# Patient Record
Sex: Female | Born: 1940 | Race: White | Hispanic: No | Smoking: Former smoker
Health system: Southern US, Community
[De-identification: ages and names within clinical notes are randomized; demographics above are authoritative.]

## PROBLEM LIST (undated history)

## (undated) DIAGNOSIS — Z720 Tobacco use: Secondary | ICD-10-CM

## (undated) DIAGNOSIS — N281 Cyst of kidney, acquired: Secondary | ICD-10-CM

## (undated) DIAGNOSIS — Z8673 Personal history of transient ischemic attack (TIA), and cerebral infarction without residual deficits: Secondary | ICD-10-CM

## (undated) DIAGNOSIS — Z8679 Personal history of other diseases of the circulatory system: Secondary | ICD-10-CM

## (undated) DIAGNOSIS — Z950 Presence of cardiac pacemaker: Secondary | ICD-10-CM

## (undated) DIAGNOSIS — Z872 Personal history of diseases of the skin and subcutaneous tissue: Secondary | ICD-10-CM

## (undated) DIAGNOSIS — I4510 Unspecified right bundle-branch block: Secondary | ICD-10-CM

## (undated) DIAGNOSIS — I5032 Chronic diastolic (congestive) heart failure: Secondary | ICD-10-CM

## (undated) DIAGNOSIS — I10 Essential (primary) hypertension: Secondary | ICD-10-CM

## (undated) DIAGNOSIS — Z952 Presence of prosthetic heart valve: Secondary | ICD-10-CM

## (undated) DIAGNOSIS — F329 Major depressive disorder, single episode, unspecified: Secondary | ICD-10-CM

## (undated) DIAGNOSIS — R5381 Other malaise: Secondary | ICD-10-CM

## (undated) DIAGNOSIS — E785 Hyperlipidemia, unspecified: Secondary | ICD-10-CM

## (undated) DIAGNOSIS — G629 Polyneuropathy, unspecified: Secondary | ICD-10-CM

## (undated) DIAGNOSIS — G4730 Sleep apnea, unspecified: Secondary | ICD-10-CM

## (undated) DIAGNOSIS — F419 Anxiety disorder, unspecified: Secondary | ICD-10-CM

## (undated) DIAGNOSIS — N289 Disorder of kidney and ureter, unspecified: Secondary | ICD-10-CM

## (undated) DIAGNOSIS — D649 Anemia, unspecified: Secondary | ICD-10-CM

## (undated) DIAGNOSIS — F32A Depression, unspecified: Secondary | ICD-10-CM

## (undated) DIAGNOSIS — I350 Nonrheumatic aortic (valve) stenosis: Secondary | ICD-10-CM

## (undated) DIAGNOSIS — M1990 Unspecified osteoarthritis, unspecified site: Secondary | ICD-10-CM

## (undated) DIAGNOSIS — K819 Cholecystitis, unspecified: Secondary | ICD-10-CM

## (undated) DIAGNOSIS — T4145XA Adverse effect of unspecified anesthetic, initial encounter: Secondary | ICD-10-CM

## (undated) HISTORY — PX: ADENOIDECTOMY: SUR15

## (undated) HISTORY — PX: COLONOSCOPY: SHX174

## (undated) HISTORY — DX: Unspecified right bundle-branch block: I45.10

## (undated) HISTORY — DX: Morbid (severe) obesity due to excess calories: E66.01

## (undated) HISTORY — PX: OTHER SURGICAL HISTORY: SHX169

## (undated) HISTORY — DX: Personal history of diseases of the skin and subcutaneous tissue: Z87.2

## (undated) HISTORY — DX: Tobacco use: Z72.0

## (undated) HISTORY — DX: Chronic diastolic (congestive) heart failure: I50.32

## (undated) HISTORY — DX: Personal history of other diseases of the circulatory system: Z86.79

## (undated) HISTORY — DX: Depression, unspecified: F32.A

## (undated) HISTORY — DX: Major depressive disorder, single episode, unspecified: F32.9

## (undated) HISTORY — DX: Hyperlipidemia, unspecified: E78.5

## (undated) HISTORY — DX: Cholecystitis, unspecified: K81.9

## (undated) HISTORY — PX: EYE SURGERY: SHX253

## (undated) HISTORY — DX: Polyneuropathy, unspecified: G62.9

## (undated) HISTORY — PX: TONSILLECTOMY: SUR1361

---

## 2009-10-15 DIAGNOSIS — E782 Mixed hyperlipidemia: Secondary | ICD-10-CM | POA: Diagnosis present

## 2011-08-17 DIAGNOSIS — I83009 Varicose veins of unspecified lower extremity with ulcer of unspecified site: Secondary | ICD-10-CM | POA: Diagnosis present

## 2011-08-17 DIAGNOSIS — L989 Disorder of the skin and subcutaneous tissue, unspecified: Secondary | ICD-10-CM | POA: Diagnosis present

## 2012-01-22 DIAGNOSIS — I358 Other nonrheumatic aortic valve disorders: Secondary | ICD-10-CM | POA: Insufficient documentation

## 2016-02-21 DIAGNOSIS — L89314 Pressure ulcer of right buttock, stage 4: Secondary | ICD-10-CM | POA: Insufficient documentation

## 2016-02-21 DIAGNOSIS — L89312 Pressure ulcer of right buttock, stage 2: Secondary | ICD-10-CM | POA: Insufficient documentation

## 2016-02-29 DIAGNOSIS — I350 Nonrheumatic aortic (valve) stenosis: Secondary | ICD-10-CM

## 2016-02-29 DIAGNOSIS — D649 Anemia, unspecified: Secondary | ICD-10-CM | POA: Insufficient documentation

## 2016-05-10 DIAGNOSIS — I878 Other specified disorders of veins: Secondary | ICD-10-CM | POA: Diagnosis present

## 2016-05-10 DIAGNOSIS — I872 Venous insufficiency (chronic) (peripheral): Secondary | ICD-10-CM | POA: Diagnosis present

## 2016-05-12 DIAGNOSIS — I272 Pulmonary hypertension, unspecified: Secondary | ICD-10-CM | POA: Insufficient documentation

## 2016-05-15 DIAGNOSIS — J9612 Chronic respiratory failure with hypercapnia: Secondary | ICD-10-CM | POA: Diagnosis present

## 2016-05-15 DIAGNOSIS — J9611 Chronic respiratory failure with hypoxia: Secondary | ICD-10-CM | POA: Diagnosis present

## 2016-05-23 DIAGNOSIS — I059 Rheumatic mitral valve disease, unspecified: Secondary | ICD-10-CM | POA: Insufficient documentation

## 2016-06-20 DIAGNOSIS — K921 Melena: Secondary | ICD-10-CM | POA: Insufficient documentation

## 2016-06-20 DIAGNOSIS — I110 Hypertensive heart disease with heart failure: Secondary | ICD-10-CM | POA: Diagnosis present

## 2016-06-27 DIAGNOSIS — I82409 Acute embolism and thrombosis of unspecified deep veins of unspecified lower extremity: Secondary | ICD-10-CM | POA: Insufficient documentation

## 2016-06-27 DIAGNOSIS — D62 Acute posthemorrhagic anemia: Secondary | ICD-10-CM | POA: Insufficient documentation

## 2016-06-28 DIAGNOSIS — I5043 Acute on chronic combined systolic (congestive) and diastolic (congestive) heart failure: Secondary | ICD-10-CM | POA: Insufficient documentation

## 2016-07-01 DIAGNOSIS — K209 Esophagitis, unspecified without bleeding: Secondary | ICD-10-CM | POA: Insufficient documentation

## 2016-09-01 DIAGNOSIS — R5383 Other fatigue: Secondary | ICD-10-CM

## 2017-04-11 DIAGNOSIS — K810 Acute cholecystitis: Secondary | ICD-10-CM | POA: Diagnosis present

## 2017-05-22 DIAGNOSIS — K8020 Calculus of gallbladder without cholecystitis without obstruction: Secondary | ICD-10-CM

## 2017-05-22 DIAGNOSIS — R109 Unspecified abdominal pain: Secondary | ICD-10-CM | POA: Insufficient documentation

## 2018-01-21 ENCOUNTER — Ambulatory Visit (INDEPENDENT_AMBULATORY_CARE_PROVIDER_SITE_OTHER): Payer: Medicare PPO | Admitting: Cardiology

## 2018-01-21 ENCOUNTER — Encounter: Payer: Self-pay | Admitting: Cardiology

## 2018-01-21 VITALS — BP 156/88 | HR 86 | Ht 66.0 in | Wt 274.0 lb

## 2018-01-21 DIAGNOSIS — Z8679 Personal history of other diseases of the circulatory system: Secondary | ICD-10-CM

## 2018-01-21 DIAGNOSIS — E782 Mixed hyperlipidemia: Secondary | ICD-10-CM

## 2018-01-21 DIAGNOSIS — R011 Cardiac murmur, unspecified: Secondary | ICD-10-CM

## 2018-01-21 DIAGNOSIS — F17201 Nicotine dependence, unspecified, in remission: Secondary | ICD-10-CM | POA: Diagnosis not present

## 2018-01-21 NOTE — Progress Notes (Signed)
Cardiology Office Note  Date: 01/21/2018   ID: Dawn Gonzales, DOB 06-14-1940, MRN [id number]  PCP: Benita Stabile, MD  Consulting Cardiologist: Nona Dell, MD   Chief Complaint  Patient presents with  . Heart Murmur    History of Present Illness: Dawn Gonzales is a 77 y.o. female referred for cardiology consultation by Dr. Margo Aye.  She is here today with her daughter.  At the present time I do not have any records regarding her previous medical history while living in [place].  Reportedly, back in 2017 she was diagnosed with leg cellulitis, ultimately endocarditis, had a prolonged rehabilitation stay of almost a year, and was discharged to home back in March of this year.  She has moved to [place] to be closer to family.  She also tells me that she has a long-standing history of heart murmur dating back many years.  She was seen by a cardiologist during her above described evaluation for what sounds like aortic stenosis.  Based on her description to me today, it sounds like TAVR was discussed as an option.  Current medications are outlined below.  She is on aspirin and Pravachol, states that she has been on these for at least a year.  She does not report any exertional chest pain.  Functionally limited at baseline, in a wheelchair today.  Personally reviewed her ECG today which shows a sinus rhythm with right bundle branch block and lead loss in V6.  Past Medical History:  Diagnosis Date  . Depression   . Heart murmur   . History of cellulitis   . History of endocarditis   . Hyperlipemia   . Neuropathy     Past Surgical History:  Procedure Laterality Date  . Abdominal gangrene    . ADENOIDECTOMY    . Cataract surgery    . COLONOSCOPY    . TONSILLECTOMY    . Tummy tuck      Current Outpatient Medications  Medication Sig Dispense Refill  . aspirin EC 81 MG tablet Take 81 mg by mouth daily.    . Calcium Carbonate (CALCIUM-CARB 600 PO) Take 600 mg  by mouth daily.    . ferrous sulfate 325 (65 FE) MG tablet Take 325 mg by mouth daily with breakfast.    . gabapentin (NEURONTIN) 300 MG capsule Take 300 mg by mouth 3 (three) times daily.    . Multiple Vitamin (MULTIVITAMIN WITH MINERALS) TABS tablet Take 1 tablet by mouth daily.    . pravastatin (PRAVACHOL) 20 MG tablet Take 20 mg by mouth daily.    Marland Kitchen venlafaxine (EFFEXOR) 37.5 MG tablet Take 37.5 mg by mouth daily.     No current facility-administered medications for this visit.    Allergies:  Patient has no known allergies.   Social History: The patient  reports that she quit smoking about 39 years ago. She has never used smokeless tobacco. She reports that she drinks alcohol. She reports that she does not use drugs.   Family History: The patient's family history includes Arthritis in her father; Cancer in her mother; Hypertension in her father.   ROS:  Please see the history of present illness. Otherwise, complete review of systems is positive for some trouble with memory, chronic leg swelling and venous stasis.  All other systems are reviewed and negative.   Physical Exam: VS:  BP (!) 156/88 (BP Location: Left Wrist)   Pulse 86   Ht 5\' 6"  (1.676 m)   Wt 274 lb (124.3  kg)   SpO2 93%   BMI 44.22 kg/m , BMI Body mass index is 44.22 kg/m.  Wt Readings from Last 3 Encounters:  01/21/18 274 lb (124.3 kg)    General: Morbidly obese woman seated in wheelchair. HEENT: Conjunctiva and lids normal, oropharynx clear. Neck: Supple, no elevated JVP, systolic heart murmur radiates to the carotids, no thyromegaly. Lungs: Clear to auscultation, nonlabored breathing at rest. Cardiac: Regular rate and rhythm, no S3, 3-4/6 basal systolic murmur, no pericardial rub. Abdomen: Morbidly obese with pannus, nontender, bowel sounds present. Extremities: Chronic appearing bilateral leg edema with distal venous stasis, distal pulses 1+. Skin: Warm and dry. Musculoskeletal: No  kyphosis. Neuropsychiatric: Alert and oriented x3, affect grossly appropriate.  ECG: No old tracing available for comparison today.  Recent Labwork:  No recent lab work available for review.  Other Studies Reviewed Today:  No prior echocardiogram available for review.  Assessment and Plan:  1.  Patient presents for cardiology evaluation with incomplete information regarding previous work-up.  She has a long-standing heart murmur and most likely aortic stenosis.  Complicating this picture is described endocarditis that occurred during treatment for cellulitis within the last 1-1/2 years while living in [place].  She states that she had "heart failure" at that time, although uncertain whether this was valvular related to left ventricular dysfunction.  Plan is to obtain an echocardiogram for baseline and also request records.  We will bring her back to the office for further discussion.   2.  Mixed hyperlipidemia, on Pravachol.  Now following with Dr. Margo AyeHall.  3.  Tobacco abuse in remission, quit many years ago.  Current medicines were reviewed with the patient today.   Orders Placed This Encounter  Procedures  . EKG 12-Lead  . ECHOCARDIOGRAM COMPLETE    Disposition: Follow-up in 6 weeks.  Signed, Jonelle SidleSamuel G. Dashauna Heymann, MD, [facility] 01/21/2018 1:57 PM    [facility] [address], [place], [place] [address] Phone: [phone]; Fax: [phone]

## 2018-01-21 NOTE — Patient Instructions (Signed)
Your physician recommends that you schedule a follow-up appointment in: 6 weeks with Dr. Diona BrownerMcDowell  Your physician has requested that you have an echocardiogram. Echocardiography is a painless test that uses sound waves to create images of your heart. It provides your doctor with information about the size and shape of your heart and how well your heart's chambers and valves are working. This procedure takes approximately one hour. There are no restrictions for this procedure.    Your physician recommends that you continue on your current medications as directed. Please refer to the Current Medication list given to you today.     If you need a refill on your cardiac medications before your next appointment, please call your pharmacy.     No lab work ordered today      Thank you for choosing [facility] !

## 2018-02-01 ENCOUNTER — Ambulatory Visit (HOSPITAL_COMMUNITY)
Admission: RE | Admit: 2018-02-01 | Discharge: 2018-02-01 | Disposition: A | Discharge status: Home / Self Care | Payer: Medicare PPO | Source: Ambulatory Visit | Attending: Internal Medicine | Admitting: Internal Medicine

## 2018-02-01 DIAGNOSIS — R7881 Bacteremia: Secondary | ICD-10-CM | POA: Insufficient documentation

## 2018-02-01 DIAGNOSIS — R011 Cardiac murmur, unspecified: Secondary | ICD-10-CM | POA: Diagnosis not present

## 2018-02-01 DIAGNOSIS — I080 Rheumatic disorders of both mitral and aortic valves: Secondary | ICD-10-CM | POA: Insufficient documentation

## 2018-02-01 DIAGNOSIS — E785 Hyperlipidemia, unspecified: Secondary | ICD-10-CM | POA: Diagnosis not present

## 2018-02-06 ENCOUNTER — Observation Stay (HOSPITAL_COMMUNITY): Payer: Medicare PPO

## 2018-02-06 ENCOUNTER — Emergency Department (HOSPITAL_COMMUNITY): Payer: Medicare PPO

## 2018-02-06 ENCOUNTER — Other Ambulatory Visit: Payer: Self-pay

## 2018-02-06 ENCOUNTER — Inpatient Hospital Stay (HOSPITAL_COMMUNITY)
Admission: EM | Admit: 2018-02-06 | Discharge: 2018-02-13 | DRG: 444 | Disposition: A | Discharge status: Skilled Nursing Facility | Payer: Medicare PPO | Attending: Internal Medicine | Admitting: Internal Medicine

## 2018-02-06 ENCOUNTER — Encounter (HOSPITAL_COMMUNITY): Payer: Self-pay | Admitting: Emergency Medicine

## 2018-02-06 DIAGNOSIS — R001 Bradycardia, unspecified: Secondary | ICD-10-CM

## 2018-02-06 DIAGNOSIS — F329 Major depressive disorder, single episode, unspecified: Secondary | ICD-10-CM | POA: Diagnosis present

## 2018-02-06 DIAGNOSIS — N179 Acute kidney failure, unspecified: Secondary | ICD-10-CM | POA: Diagnosis not present

## 2018-02-06 DIAGNOSIS — Z8719 Personal history of other diseases of the digestive system: Secondary | ICD-10-CM

## 2018-02-06 DIAGNOSIS — Z23 Encounter for immunization: Secondary | ICD-10-CM

## 2018-02-06 DIAGNOSIS — J9811 Atelectasis: Secondary | ICD-10-CM | POA: Diagnosis present

## 2018-02-06 DIAGNOSIS — R112 Nausea with vomiting, unspecified: Secondary | ICD-10-CM | POA: Diagnosis present

## 2018-02-06 DIAGNOSIS — A4189 Other specified sepsis: Secondary | ICD-10-CM | POA: Diagnosis not present

## 2018-02-06 DIAGNOSIS — K5900 Constipation, unspecified: Secondary | ICD-10-CM | POA: Diagnosis not present

## 2018-02-06 DIAGNOSIS — G629 Polyneuropathy, unspecified: Secondary | ICD-10-CM | POA: Diagnosis present

## 2018-02-06 DIAGNOSIS — Z7982 Long term (current) use of aspirin: Secondary | ICD-10-CM

## 2018-02-06 DIAGNOSIS — Z8679 Personal history of other diseases of the circulatory system: Secondary | ICD-10-CM

## 2018-02-06 DIAGNOSIS — G4733 Obstructive sleep apnea (adult) (pediatric): Secondary | ICD-10-CM | POA: Diagnosis present

## 2018-02-06 DIAGNOSIS — D509 Iron deficiency anemia, unspecified: Secondary | ICD-10-CM | POA: Diagnosis present

## 2018-02-06 DIAGNOSIS — K8020 Calculus of gallbladder without cholecystitis without obstruction: Secondary | ICD-10-CM

## 2018-02-06 DIAGNOSIS — R1013 Epigastric pain: Secondary | ICD-10-CM | POA: Diagnosis present

## 2018-02-06 DIAGNOSIS — K8050 Calculus of bile duct without cholangitis or cholecystitis without obstruction: Secondary | ICD-10-CM

## 2018-02-06 DIAGNOSIS — Z6841 Body Mass Index (BMI) 40.0 and over, adult: Secondary | ICD-10-CM

## 2018-02-06 DIAGNOSIS — Z8261 Family history of arthritis: Secondary | ICD-10-CM

## 2018-02-06 DIAGNOSIS — K8043 Calculus of bile duct with acute cholecystitis with obstruction: Secondary | ICD-10-CM | POA: Diagnosis not present

## 2018-02-06 DIAGNOSIS — Z809 Family history of malignant neoplasm, unspecified: Secondary | ICD-10-CM

## 2018-02-06 DIAGNOSIS — K8000 Calculus of gallbladder with acute cholecystitis without obstruction: Secondary | ICD-10-CM | POA: Diagnosis present

## 2018-02-06 DIAGNOSIS — K810 Acute cholecystitis: Secondary | ICD-10-CM | POA: Diagnosis present

## 2018-02-06 DIAGNOSIS — I350 Nonrheumatic aortic (valve) stenosis: Secondary | ICD-10-CM

## 2018-02-06 DIAGNOSIS — K819 Cholecystitis, unspecified: Secondary | ICD-10-CM

## 2018-02-06 DIAGNOSIS — Z8673 Personal history of transient ischemic attack (TIA), and cerebral infarction without residual deficits: Secondary | ICD-10-CM

## 2018-02-06 DIAGNOSIS — I452 Bifascicular block: Secondary | ICD-10-CM | POA: Diagnosis present

## 2018-02-06 DIAGNOSIS — E669 Obesity, unspecified: Secondary | ICD-10-CM

## 2018-02-06 DIAGNOSIS — K8021 Calculus of gallbladder without cholecystitis with obstruction: Secondary | ICD-10-CM

## 2018-02-06 DIAGNOSIS — Z79899 Other long term (current) drug therapy: Secondary | ICD-10-CM

## 2018-02-06 DIAGNOSIS — I700 Atherosclerosis of aorta: Secondary | ICD-10-CM | POA: Diagnosis present

## 2018-02-06 DIAGNOSIS — Z8249 Family history of ischemic heart disease and other diseases of the circulatory system: Secondary | ICD-10-CM

## 2018-02-06 DIAGNOSIS — Z01810 Encounter for preprocedural cardiovascular examination: Secondary | ICD-10-CM

## 2018-02-06 DIAGNOSIS — Z87891 Personal history of nicotine dependence: Secondary | ICD-10-CM

## 2018-02-06 DIAGNOSIS — E782 Mixed hyperlipidemia: Secondary | ICD-10-CM | POA: Diagnosis present

## 2018-02-06 LAB — LIPASE, BLOOD: Lipase: 23 U/L (ref 11–51)

## 2018-02-06 LAB — COMPREHENSIVE METABOLIC PANEL
ALT: 12 U/L (ref 0–44)
AST: 17 U/L (ref 15–41)
Albumin: 3.6 g/dL (ref 3.5–5.0)
Alkaline Phosphatase: 69 U/L (ref 38–126)
Anion gap: 8 (ref 5–15)
BUN: 18 mg/dL (ref 8–23)
CO2: 26 mmol/L (ref 22–32)
Calcium: 9.2 mg/dL (ref 8.9–10.3)
Chloride: 105 mmol/L (ref 98–111)
Creatinine, Ser: 0.6 mg/dL (ref 0.44–1.00)
GFR calc Af Amer: >60 mL/min (ref >60)
GFR calc non Af Amer: >60 mL/min (ref >60)
Glucose, Bld: 108 mg/dL — ABNORMAL HIGH (ref 70–99)
Potassium: 3.8 mmol/L (ref 3.5–5.1)
Sodium: 139 mmol/L (ref 135–145)
Total Bilirubin: 0.6 mg/dL (ref 0.3–1.2)
Total Protein: 6.8 g/dL (ref 6.5–8.1)

## 2018-02-06 LAB — CBC WITH DIFFERENTIAL/PLATELET
Abs Immature Granulocytes: 0.02 10*3/uL (ref 0.00–0.07)
Basophils Absolute: 0 10*3/uL (ref 0.0–0.1)
Basophils Relative: 0 %
Eosinophils Absolute: 0.2 10*3/uL (ref 0.0–0.5)
Eosinophils Relative: 2 %
HCT: 38.5 % (ref 36.0–46.0)
Hemoglobin: 11.7 g/dL — ABNORMAL LOW (ref 12.0–15.0)
Immature Granulocytes: 0 %
Lymphocytes Relative: 16 %
Lymphs Abs: 1.5 10*3/uL (ref 0.7–4.0)
MCH: 29.8 pg (ref 26.0–34.0)
MCHC: 30.4 g/dL (ref 30.0–36.0)
MCV: 98 fL (ref 80.0–100.0)
Monocytes Absolute: 0.6 10*3/uL (ref 0.1–1.0)
Monocytes Relative: 6 %
Neutro Abs: 6.9 10*3/uL (ref 1.7–7.7)
Neutrophils Relative %: 76 %
Platelets: 255 10*3/uL (ref 150–400)
RBC: 3.93 MIL/uL (ref 3.87–5.11)
RDW: 14.6 % (ref 11.5–15.5)
WBC: 9.2 10*3/uL (ref 4.0–10.5)
nRBC: 0 % (ref 0.0–0.2)

## 2018-02-06 LAB — URINALYSIS, ROUTINE W REFLEX MICROSCOPIC
Bilirubin Urine: NEGATIVE
Glucose, UA: NEGATIVE mg/dL
Hgb urine dipstick: NEGATIVE
Ketones, ur: NEGATIVE mg/dL
Leukocytes, UA: NEGATIVE
Nitrite: NEGATIVE
Protein, ur: NEGATIVE mg/dL
Specific Gravity, Urine: 1.005 (ref 1.005–1.030)
pH: 7 (ref 5.0–8.0)

## 2018-02-06 LAB — TROPONIN I
Troponin I: <0.03 ng/mL (ref <0.03)
Troponin I: <0.03 ng/mL (ref <0.03)

## 2018-02-06 MED ORDER — VENLAFAXINE HCL 37.5 MG PO TABS
37.5000 mg | ORAL_TABLET | Freq: Every day | ORAL | Status: DC
  Administered 2018-02-07 – 2018-02-13 (×6): 37.5 mg via ORAL
  Filled 2018-02-06 (×8): qty 1

## 2018-02-06 MED ORDER — SODIUM CHLORIDE 0.9 % IV SOLN: INTRAVENOUS | Status: DC | PRN

## 2018-02-06 MED ORDER — ORAL CARE MOUTH RINSE
15.0000 mL | Freq: Two times a day (BID) | OROMUCOSAL | Status: DC
  Administered 2018-02-06 – 2018-02-13 (×8): 15 mL via OROMUCOSAL

## 2018-02-06 MED ORDER — SODIUM CHLORIDE 0.9 % IV SOLN
250.0000 mL | INTRAVENOUS | Status: DC | PRN
  Administered 2018-02-07: 250 mL via INTRAVENOUS

## 2018-02-06 MED ORDER — ENOXAPARIN SODIUM 60 MG/0.6ML ~~LOC~~ SOLN
60.0000 mg | SUBCUTANEOUS | Status: DC
  Administered 2018-02-06: 60 mg via SUBCUTANEOUS
  Filled 2018-02-06: qty 0.6

## 2018-02-06 MED ORDER — MORPHINE SULFATE (PF) 2 MG/ML IV SOLN
0.5000 mg | Freq: Four times a day (QID) | INTRAVENOUS | Status: DC | PRN
  Filled 2018-02-06: qty 1

## 2018-02-06 MED ORDER — SODIUM CHLORIDE 0.9% FLUSH
3.0000 mL | Freq: Two times a day (BID) | INTRAVENOUS | Status: DC
  Administered 2018-02-06 – 2018-02-12 (×7): 3 mL via INTRAVENOUS

## 2018-02-06 MED ORDER — SODIUM CHLORIDE 0.9% FLUSH: 3.0000 mL | INTRAVENOUS | Status: DC | PRN

## 2018-02-06 MED ORDER — VITAMIN C 100 MG PO TABS
100.0000 mg | ORAL_TABLET | Freq: Every day | ORAL | Status: DC
  Filled 2018-02-06 (×2): qty 1

## 2018-02-06 MED ORDER — ACETAMINOPHEN 650 MG RE SUPP: 650.0000 mg | Freq: Four times a day (QID) | RECTAL | Status: DC | PRN

## 2018-02-06 MED ORDER — GABAPENTIN 300 MG PO CAPS
300.0000 mg | ORAL_CAPSULE | Freq: Three times a day (TID) | ORAL | Status: DC
  Administered 2018-02-06 – 2018-02-13 (×18): 300 mg via ORAL
  Filled 2018-02-06 (×18): qty 1

## 2018-02-06 MED ORDER — ONDANSETRON HCL 4 MG/2ML IJ SOLN: 4.0000 mg | Freq: Four times a day (QID) | INTRAMUSCULAR | Status: DC | PRN

## 2018-02-06 MED ORDER — ASPIRIN EC 81 MG PO TBEC
81.0000 mg | DELAYED_RELEASE_TABLET | Freq: Every day | ORAL | Status: DC
  Administered 2018-02-06 – 2018-02-13 (×8): 81 mg via ORAL
  Filled 2018-02-06 (×8): qty 1

## 2018-02-06 MED ORDER — CALCIUM CARBONATE 1250 (500 CA) MG PO TABS
1250.0000 mg | ORAL_TABLET | Freq: Every day | ORAL | Status: DC
  Administered 2018-02-07 – 2018-02-13 (×7): 1250 mg via ORAL
  Filled 2018-02-06 (×7): qty 1

## 2018-02-06 MED ORDER — INFLUENZA VAC SPLIT HIGH-DOSE 0.5 ML IM SUSY
0.5000 mL | PREFILLED_SYRINGE | INTRAMUSCULAR | Status: AC
  Administered 2018-02-07: 0.5 mL via INTRAMUSCULAR
  Filled 2018-02-06: qty 0.5

## 2018-02-06 MED ORDER — ACETAMINOPHEN 325 MG PO TABS
650.0000 mg | ORAL_TABLET | Freq: Four times a day (QID) | ORAL | Status: DC | PRN
  Administered 2018-02-06 – 2018-02-12 (×4): 650 mg via ORAL
  Filled 2018-02-06 (×4): qty 2

## 2018-02-06 MED ORDER — SODIUM CHLORIDE 0.9 % IJ SOLN
INTRAMUSCULAR | Status: AC
  Administered 2018-02-07: 250 mL via INTRAVENOUS
  Filled 2018-02-06: qty 300

## 2018-02-06 MED ORDER — VITAMIN D 1000 UNITS PO TABS
1000.0000 [IU] | ORAL_TABLET | Freq: Every day | ORAL | Status: DC
  Administered 2018-02-07 – 2018-02-13 (×7): 1000 [IU] via ORAL
  Filled 2018-02-06 (×9): qty 1

## 2018-02-06 MED ORDER — PRAVASTATIN SODIUM 10 MG PO TABS
20.0000 mg | ORAL_TABLET | Freq: Every day | ORAL | Status: DC
  Administered 2018-02-06 – 2018-02-12 (×7): 20 mg via ORAL
  Filled 2018-02-06 (×7): qty 2

## 2018-02-06 MED ORDER — ENOXAPARIN SODIUM 40 MG/0.4ML ~~LOC~~ SOLN: 40.0000 mg | SUBCUTANEOUS | Status: DC

## 2018-02-06 MED ORDER — ADULT MULTIVITAMIN W/MINERALS CH
1.0000 | ORAL_TABLET | Freq: Every day | ORAL | Status: DC
  Administered 2018-02-06 – 2018-02-13 (×8): 1 via ORAL
  Filled 2018-02-06 (×8): qty 1

## 2018-02-06 MED ORDER — PANTOPRAZOLE SODIUM 40 MG PO TBEC
40.0000 mg | DELAYED_RELEASE_TABLET | Freq: Two times a day (BID) | ORAL | Status: DC
  Administered 2018-02-06 – 2018-02-07 (×2): 40 mg via ORAL
  Filled 2018-02-06 (×2): qty 1

## 2018-02-06 MED ORDER — ONDANSETRON HCL 4 MG/2ML IJ SOLN
4.0000 mg | Freq: Once | INTRAMUSCULAR | Status: AC
  Administered 2018-02-06: 4 mg via INTRAVENOUS
  Filled 2018-02-06: qty 2

## 2018-02-06 MED ORDER — FERROUS SULFATE 325 (65 FE) MG PO TABS
325.0000 mg | ORAL_TABLET | Freq: Every day | ORAL | Status: DC
  Administered 2018-02-07: 325 mg via ORAL
  Filled 2018-02-06: qty 1

## 2018-02-06 MED ORDER — GADOBUTROL 1 MMOL/ML IV SOLN
10.0000 mL | Freq: Once | INTRAVENOUS | Status: AC | PRN
  Administered 2018-02-06: 10 mL via INTRAVENOUS

## 2018-02-06 MED ORDER — HYDRALAZINE HCL 20 MG/ML IJ SOLN: 5.0000 mg | Freq: Four times a day (QID) | INTRAMUSCULAR | Status: DC | PRN

## 2018-02-06 MED ORDER — SODIUM CHLORIDE 0.9 % IV SOLN
2.0000 g | Freq: Once | INTRAVENOUS | Status: AC
  Administered 2018-02-06: 2 g via INTRAVENOUS
  Filled 2018-02-06: qty 20

## 2018-02-06 NOTE — H&P (Signed)
TRH H&P   Patient Demographics:    Dawn Gonzales, is a 77 y.o. female  MRN: [id number]   DOB - [birth year]  Admit Date - 02/06/2018  Outpatient Primary MD for the patient is Benita Stabile, MD  Referring MD/NP/PA: Loren Racer  Outpatient Specialists:  Dr. Diona Browner  Patient coming from:  home  Chief Complaint  Patient presents with  . Abdominal Pain      HPI:    Dawn Gonzales  is a 77 y.o. female,w depression, hyperlipidemia, severe aortic stenosis apparently c/o nausea and also epigastric pain starting this AM. Pt denies fever, chills, cp, palp, sob, emesis, diarrhea, brbpr, dysuria, hematuria.  Pt took tums without relief and presented to ED for evaluation.   In ED,    RUQ ultrasound IMPRESSION: Cholelithiasis is noted with moderate gallbladder wall thickening and positive sonographic Murphy's sign concerning for acute cholecystitis.    Common bile duct dilatation is noted concerning for distal common bile duct obstruction. Correlation with liver function tests and MRCP is recommended.  CXR IMPRESSION: Cardiomegaly without pulmonary vascular congestion or pulmonary edema.  Coarse lung markings in the retrocardiac region may reflect atelectasis or early pneumonia though I favor atelectasis.  Thoracic aortic atherosclerosis.  Wbc 9.2, Hgb 11.7, Plt 255 Na 139, K 3.8, Bun 18, Creatinine 0.60 Ast 17, Alt 12 Trop <0.03 Lipase 23     Review of systems:    In addition to the HPI above,  No Fever-chills, No Headache, No changes with Vision or hearing, No problems swallowing food or Liquids, No Chest pain, Cough or Shortness of Breath, + epigastric pain, no radiation, no odd food eaten.   No Vommitting, Bowel movements are regular, No Blood in stool or Urine, No dysuria, No new skin rashes or bruises, No new joints pains-aches,  No new  weakness, tingling, numbness in any extremity, No recent weight gain or loss, No polyuria, polydypsia or polyphagia, No significant Mental Stressors.  A full 10 point Review of Systems was done, except as stated above, all other Review of Systems were negative.   With Past History of the following :    Past Medical History:  Diagnosis Date  . Depression   . Heart murmur   . History of cellulitis   . History of endocarditis   . Hyperlipemia   . Neuropathy       Past Surgical History:  Procedure Laterality Date  . Abdominal gangrene    . ADENOIDECTOMY    . Cataract surgery    . COLONOSCOPY    . TONSILLECTOMY    . Tummy tuck        Social History:     Social History   Tobacco Use  . Smoking status: Former Smoker    Last attempt to quit: 05/23/1978    Years since quitting: 39.7  . Smokeless tobacco: Never Used  . Tobacco comment:  only 1 pack pewr week   Substance Use Topics  . Alcohol use: Yes    Comment: Occasional     Lives - at home  Mobility - walks by self    Family History :     Family History  Problem Relation Age of Onset  . Cancer Mother   . Hypertension Father   . Arthritis Father        Home Medications:   Prior to Admission medications   Medication Sig Start Date End Date Taking? Authorizing Provider  Ascorbic Acid (VITAMIN C) 100 MG tablet Take 100 mg by mouth daily.   Yes [provider]  aspirin EC 81 MG tablet Take 81 mg by mouth daily.   Yes [provider]  Calcium Carbonate (CALCIUM-CARB 600 PO) Take 600 mg by mouth daily.   Yes [provider]  cholecalciferol (VITAMIN D) 1000 units tablet Take 1,000 Units by mouth daily.   Yes [provider]  ferrous sulfate 325 (65 FE) MG tablet Take 325 mg by mouth daily with breakfast.   Yes [provider]  gabapentin (NEURONTIN) 300 MG capsule Take 300 mg by mouth 3 (three) times daily.   Yes [provider]  Multiple Vitamin  (MULTIVITAMIN WITH MINERALS) TABS tablet Take 1 tablet by mouth daily.   Yes [provider]  nystatin ointment (MYCOSTATIN) Apply 1 application topically 2 (two) times daily as needed. 01/26/18  Yes [provider]  pravastatin (PRAVACHOL) 20 MG tablet Take 20 mg by mouth daily.   Yes [provider]  venlafaxine (EFFEXOR) 37.5 MG tablet Take 37.5 mg by mouth daily.   Yes [provider]     Allergies:    No Known Allergies   Physical Exam:   Vitals  Blood pressure (!) 147/87, pulse 66, temperature 98.2 F (36.8 C), temperature source Oral, resp. rate 18, height 5\' 6"  (1.676 m), weight 124.3 kg, SpO2 91 %.   1. General  lying in bed in NAD,   2. Normal affect and insight, Not Suicidal or Homicidal, Awake Alert, Oriented X 3.  3. No F.N deficits, ALL C.Nerves Intact, Strength 5/5 all 4 extremities, Sensation intact all 4 extremities, Plantars down going.  4. Ears and Eyes appear Normal, Conjunctivae clear, PERRLA. Moist Oral Mucosa.  5. Supple Neck, No JVD, No cervical lymphadenopathy appriciated, No Carotid Bruits.  6. Symmetrical Chest wall movement, Good air movement bilaterally, CTAB.  7. RRR, s1, s2, 2/6 sem rusb with radiation to neck  8. Positive Bowel Sounds, Abdomen Soft, No tenderness, negative murphy sign, No organomegaly appriciated,No rebound -guarding or rigidity.  9.  No Cyanosis, Normal Skin Turgor, No Skin Rash or Bruise.  10. Good muscle tone,  joints appear normal , no effusions, Normal ROM.  11. No Palpable Lymph Nodes in Neck or Axillae     Data Review:    CBC Recent Labs  Lab 02/06/18 1251  WBC 9.2  HGB 11.7*  HCT 38.5  PLT 255  MCV 98.0  MCH 29.8  MCHC 30.4  RDW 14.6  LYMPHSABS 1.5  MONOABS 0.6  EOSABS 0.2  BASOSABS 0.0   ------------------------------------------------------------------------------------------------------------------  Chemistries  Recent Labs  Lab 02/06/18 1251  NA 139  K  3.8  CL 105  CO2 26  GLUCOSE 108*  BUN 18  CREATININE 0.60  CALCIUM 9.2  AST 17  ALT 12  ALKPHOS 69  BILITOT 0.6   ------------------------------------------------------------------------------------------------------------------ estimated creatinine clearance is 79.3 mL/min (by C-G formula based  on SCr of 0.6 mg/dL). ------------------------------------------------------------------------------------------------------------------ No results for input(s): TSH, T4TOTAL, T3FREE, THYROIDAB in the last 72 hours.  Invalid input(s): FREET3  Coagulation profile No results for input(s): INR, PROTIME in the last 168 hours. ------------------------------------------------------------------------------------------------------------------- No results for input(s): DDIMER in the last 72 hours. -------------------------------------------------------------------------------------------------------------------  Cardiac Enzymes Recent Labs  Lab 02/06/18 1251  TROPONINI <0.03   ------------------------------------------------------------------------------------------------------------------ No results found for: BNP   ---------------------------------------------------------------------------------------------------------------  Urinalysis    Component Value Date/Time   COLORURINE STRAW (A) 02/06/2018 1227   APPEARANCEUR CLEAR 02/06/2018 1227   LABSPEC 1.005 02/06/2018 1227   PHURINE 7.0 02/06/2018 1227   GLUCOSEU NEGATIVE 02/06/2018 1227   HGBUR NEGATIVE 02/06/2018 1227   BILIRUBINUR NEGATIVE 02/06/2018 1227   KETONESUR NEGATIVE 02/06/2018 1227   PROTEINUR NEGATIVE 02/06/2018 1227   NITRITE NEGATIVE 02/06/2018 1227   LEUKOCYTESUR NEGATIVE 02/06/2018 1227    ----------------------------------------------------------------------------------------------------------------   Imaging Results:    Dg Chest 2 View  Result Date: 02/06/2018 CLINICAL DATA:  Three days of epigastric pain  which worsened this morning. History of CHF, former smoker. EXAM: CHEST - 2 VIEW COMPARISON:  None. FINDINGS: The lungs are mildly hypoinflated. The lung markings are coarse in the retrocardiac region and are accentuated by hypoinflation. The cardiac silhouette is enlarged. The pulmonary vascularity is normal. There is calcification in the wall of the aortic arch. The bony thorax exhibits no acute abnormality. The bowel gas pattern in the upper abdomen is unremarkable. IMPRESSION: Cardiomegaly without pulmonary vascular congestion or pulmonary edema. Coarse lung markings in the retrocardiac region may reflect atelectasis or early pneumonia though I favor atelectasis. Thoracic aortic atherosclerosis. Electronically Signed   By: David  [place] M.D.   On: 02/06/2018 14:14   US Abdomen Limited  Result Date: 02/06/2018 CLINICAL DATA:  Acute right upper quadrant abdominal pain. EXAM: ULTRASOUND ABDOMEN LIMITED RIGHT UPPER QUADRANT COMPARISON:  None. FINDINGS: Gallbladder: Multiple gallstones are noted with the largest measuring 2.5 cm. Gallbladder wall thickening is noted at 7 mm. Positive sonographic Murphy's sign is noted. Sludge is noted within gallbladder lumen. Common bile duct: Diameter: 11 mm which is dilated and concerning for distal common bile duct obstruction. Liver: No focal lesion identified. Within normal limits in parenchymal echogenicity. Portal vein is patent on color Doppler imaging with normal direction of blood flow towards the liver. IMPRESSION: Cholelithiasis is noted with moderate gallbladder wall thickening and positive sonographic Murphy's sign concerning for acute cholecystitis. Common bile duct dilatation is noted concerning for distal common bile duct obstruction. Correlation with liver function tests and MRCP is recommended. Electronically Signed   By: Lupita Raider, M.D.   On: 02/06/2018 13:55   nsr at 70, nl axis, RBBB    Assessment & Plan:    Principal Problem:   Epigastric  pain Active Problems:   N&V (nausea and vomiting)    Epigastric pain NPO except for medications Ns iv MRCP Rocephin 2gm iv x1 in ED Defer to surgery/ GI regarding further abx Surgery consult per ED, appreciate input GI consult per ED, appreciate input Start protonix 40mg  po bid  Nausea Zofran 4mg  iv q6h prn   Severe aortic stenosis If having surgical procedure may need to consult cardiology for clearance  Depression Cont Effexor  Hyperlipidemia Cont Pravastatin 20mg  po qhs  Iron def anemia per pt Cont ferrous sulfate  Neuropathy Cont gabapentin     DVT Prophylaxis Lovenox - SCDs   AM Labs Ordered, also please review Full Orders  Family Communication: Admission, patients condition and plan  of care including tests being ordered have been discussed with the patient who indicate understanding and agree with the plan and Code Status.  Code Status  FULL CODE  Likely DC to  home  Condition GUARDED   Consults called: surgery and GI by ED, appreciate input  Admission status:  Observation, pt requires hospitalization for epigastric pain r/o cholecystitis.  Depending upon results of MRCP might require inpatient stay for further procedures/ surgery  Time spent in minutes : 70   Pearson Grippe M.D on 02/06/2018 at 4:21 PM  Between 7am to 7pm - Pager - [phone]  . After 7pm go to [email or web address] - password [facility]  [facility] - Office  [phone]

## 2018-02-06 NOTE — ED Notes (Signed)
Placed external wick due to patient cannot control her urine and we do not have a brief or pad that will fit

## 2018-02-06 NOTE — ED Provider Notes (Signed)
[facility] EMERGENCY DEPARTMENT Provider Note   CSN: [id number] Arrival date & time: 02/06/18  1155     History   Chief Complaint Chief Complaint  Patient presents with  . Abdominal Pain    HPI Dawn Gonzales is a 77 y.o. female.  HPI Patient presents with 3 days of episodic upper abdominal pain radiating through to the right flank region.  Pain is worse after eating.  Associated with nausea.  Had symptoms again this morning which prompted her visit to the emergency department.  Denies any fever or chills.  Does endorse some dyspnea with exertion.  No new lower extremity swelling or pain.  Denies urinary use including dysuria, frequency or urgency. Past Medical History:  Diagnosis Date  . Depression   . Heart murmur   . History of cellulitis   . History of endocarditis   . Hyperlipemia   . Neuropathy     Patient Active Problem List   Diagnosis Date Noted  . N&V (nausea and vomiting) 02/06/2018    Past Surgical History:  Procedure Laterality Date  . Abdominal gangrene    . ADENOIDECTOMY    . Cataract surgery    . COLONOSCOPY    . TONSILLECTOMY    . Tummy tuck       OB History   None      Home Medications    Prior to Admission medications   Medication Sig Start Date End Date Taking? Authorizing Provider  Ascorbic Acid (VITAMIN C) 100 MG tablet Take 100 mg by mouth daily.   Yes [provider]  aspirin EC 81 MG tablet Take 81 mg by mouth daily.   Yes [provider]  Calcium Carbonate (CALCIUM-CARB 600 PO) Take 600 mg by mouth daily.   Yes [provider]  cholecalciferol (VITAMIN D) 1000 units tablet Take 1,000 Units by mouth daily.   Yes [provider]  ferrous sulfate 325 (65 FE) MG tablet Take 325 mg by mouth daily with breakfast.   Yes [provider]  gabapentin (NEURONTIN) 300 MG capsule Take 300 mg by mouth 3 (three) times daily.   Yes [provider]  Multiple Vitamin (MULTIVITAMIN WITH  MINERALS) TABS tablet Take 1 tablet by mouth daily.   Yes [provider]  nystatin ointment (MYCOSTATIN) Apply 1 application topically 2 (two) times daily as needed. 01/26/18  Yes [provider]  pravastatin (PRAVACHOL) 20 MG tablet Take 20 mg by mouth daily.   Yes [provider]  venlafaxine (EFFEXOR) 37.5 MG tablet Take 37.5 mg by mouth daily.   Yes [provider]    Family History Family History  Problem Relation Age of Onset  . Cancer Mother   . Hypertension Father   . Arthritis Father     Social History Social History   Tobacco Use  . Smoking status: Former Smoker    Last attempt to quit: 05/23/1978    Years since quitting: 39.7  . Smokeless tobacco: Never Used  . Tobacco comment: only 1 pack pewr week   Substance Use Topics  . Alcohol use: Yes    Comment: Occasional  . Drug use: Never     Allergies   Patient has no known allergies.   Review of Systems Review of Systems  Constitutional: Negative for chills and fever.  HENT: Negative for sore throat and trouble swallowing.   Eyes: Negative for visual disturbance.  Respiratory: Positive for shortness of breath. Negative for cough.   Cardiovascular: Negative for chest  pain, palpitations and leg swelling.  Gastrointestinal: Positive for abdominal pain and nausea. Negative for constipation, diarrhea and vomiting.  Genitourinary: Positive for flank pain. Negative for dysuria, frequency, hematuria and pelvic pain.  Musculoskeletal: Positive for back pain and myalgias. Negative for neck pain and neck stiffness.  Skin: Negative for rash and wound.  Neurological: Negative for dizziness, weakness, light-headedness, numbness and headaches.  All other systems reviewed and are negative.    Physical Exam Updated Vital Signs BP (!) 147/87   Pulse 66   Temp 98.2 F (36.8 C) (Oral)   Resp 18   Ht 5\' 6"  (1.676 m)   Wt 124.3 kg   SpO2 91%   BMI 44.22 kg/m   Physical Exam    Constitutional: She is oriented to person, place, and time. She appears well-developed and well-nourished. No distress.  HENT:  Head: Normocephalic and atraumatic.  Mouth/Throat: Oropharynx is clear and moist.  Eyes: Pupils are equal, round, and reactive to light. EOM are normal.  Neck: Normal range of motion. Neck supple.  Cardiovascular: Normal rate and regular rhythm. Exam reveals no gallop and no friction rub.  Murmur heard. Harsh, holosystolic murmur  Pulmonary/Chest: Effort normal and breath sounds normal. No stridor. No respiratory distress. She has no wheezes. She has no rales. She exhibits no tenderness.  Abdominal: Soft. Bowel sounds are normal. There is tenderness. There is no rebound and no guarding.  Right upper quadrant tenderness to palpation.  No rebound or guarding.  Musculoskeletal: Normal range of motion. She exhibits no edema or tenderness.  No midline thoracic or lumbar tenderness.  No CVA tenderness.  Chronic hyperpigmentation of bilateral lower extremities consistent with venous stasis changes.  Distal pulses intact.  Neurological: She is alert and oriented to person, place, and time.  Moving all extremities without focal deficit.  Sensation intact.  Skin: Skin is warm and dry. Capillary refill takes less than 2 seconds. No rash noted. She is not diaphoretic. No erythema.  Psychiatric: She has a normal mood and affect. Her behavior is normal.  Nursing note and vitals reviewed.    ED Treatments / Results  Labs (all labs ordered are listed, but only abnormal results are displayed) Labs Reviewed  CBC WITH DIFFERENTIAL/PLATELET - Abnormal; Notable for the following components:      Result Value   Hemoglobin 11.7 (*)    All other components within normal limits  COMPREHENSIVE METABOLIC PANEL - Abnormal; Notable for the following components:   Glucose, Bld 108 (*)    All other components within normal limits  URINALYSIS, ROUTINE W REFLEX MICROSCOPIC - Abnormal;  Notable for the following components:   Color, Urine STRAW (*)    All other components within normal limits  TROPONIN I  LIPASE, BLOOD    EKG EKG Interpretation  Date/Time:  Wednesday February 06 2018 13:06:34 EDT Ventricular Rate:  71 PR Interval:    QRS Duration: 173 QT Interval:  445 QTC Calculation: 484 R Axis:   -4 Text Interpretation:  Sinus rhythm Right bundle branch block Confirmed by Loren Racer ([id number]) on 02/06/2018 1:16:53 PM   Radiology Dg Chest 2 View  Result Date: 02/06/2018 CLINICAL DATA:  Three days of epigastric pain which worsened this morning. History of CHF, former smoker. EXAM: CHEST - 2 VIEW COMPARISON:  None. FINDINGS: The lungs are mildly hypoinflated. The lung markings are coarse in the retrocardiac region and are accentuated by hypoinflation. The cardiac silhouette is enlarged. The pulmonary vascularity is normal. There is calcification in the  wall of the aortic arch. The bony thorax exhibits no acute abnormality. The bowel gas pattern in the upper abdomen is unremarkable. IMPRESSION: Cardiomegaly without pulmonary vascular congestion or pulmonary edema. Coarse lung markings in the retrocardiac region may reflect atelectasis or early pneumonia though I favor atelectasis. Thoracic aortic atherosclerosis. Electronically Signed   By: Aarik Blank  [place] M.D.   On: 02/06/2018 14:14   US Abdomen Limited  Result Date: 02/06/2018 CLINICAL DATA:  Acute right upper quadrant abdominal pain. EXAM: ULTRASOUND ABDOMEN LIMITED RIGHT UPPER QUADRANT COMPARISON:  None. FINDINGS: Gallbladder: Multiple gallstones are noted with the largest measuring 2.5 cm. Gallbladder wall thickening is noted at 7 mm. Positive sonographic Murphy's sign is noted. Sludge is noted within gallbladder lumen. Common bile duct: Diameter: 11 mm which is dilated and concerning for distal common bile duct obstruction. Liver: No focal lesion identified. Within normal limits in parenchymal echogenicity. Portal  vein is patent on color Doppler imaging with normal direction of blood flow towards the liver. IMPRESSION: Cholelithiasis is noted with moderate gallbladder wall thickening and positive sonographic Murphy's sign concerning for acute cholecystitis. Common bile duct dilatation is noted concerning for distal common bile duct obstruction. Correlation with liver function tests and MRCP is recommended. Electronically Signed   By: Lupita Raider, M.D.   On: 02/06/2018 13:55    Procedures Procedures (including critical care time)  Medications Ordered in ED Medications  cefTRIAXone (ROCEPHIN) 2 g in sodium chloride 0.9 % 100 mL IVPB (has no administration in time range)  ondansetron (ZOFRAN) injection 4 mg (4 mg Intravenous Given 02/06/18 1450)     Initial Impression / Assessment and Plan / ED Course  I have reviewed the triage vital signs and the nursing notes.  Pertinent labs & imaging results that were available during my care of the patient were reviewed by me and considered in my medical decision making (see chart for details).     With Dr. Lovell Sheehan who will consult on the patient.  Advises medicine admit and gastroenterology consult as well.  Agrees with IV antibiotics.  Dr. Selena Batten, hospitalist will admit.  Final Clinical Impressions(s) / ED Diagnoses   Final diagnoses:  Acute cholecystitis    ED Discharge Orders    None       Loren Racer, MD 02/07/18 1622

## 2018-02-06 NOTE — ED Triage Notes (Signed)
Pt c/o of epigastric pain x 3 days.  Worsened this morning.  Pt took tums with no relief.  Pt has HX of CHF but doesn't take diuretics at home

## 2018-02-06 NOTE — ED Notes (Signed)
Patient transported to MRI 

## 2018-02-06 NOTE — ED Notes (Signed)
Pts O2 sat was not positioned properly, upon assessment O2 sat was 93% on room air after readjusting device

## 2018-02-07 DIAGNOSIS — K8050 Calculus of bile duct without cholangitis or cholecystitis without obstruction: Secondary | ICD-10-CM

## 2018-02-07 DIAGNOSIS — A4189 Other specified sepsis: Secondary | ICD-10-CM | POA: Diagnosis not present

## 2018-02-07 DIAGNOSIS — I350 Nonrheumatic aortic (valve) stenosis: Secondary | ICD-10-CM | POA: Diagnosis present

## 2018-02-07 DIAGNOSIS — J9811 Atelectasis: Secondary | ICD-10-CM | POA: Diagnosis present

## 2018-02-07 DIAGNOSIS — K8021 Calculus of gallbladder without cholecystitis with obstruction: Secondary | ICD-10-CM

## 2018-02-07 DIAGNOSIS — A419 Sepsis, unspecified organism: Secondary | ICD-10-CM | POA: Diagnosis not present

## 2018-02-07 DIAGNOSIS — G4733 Obstructive sleep apnea (adult) (pediatric): Secondary | ICD-10-CM | POA: Diagnosis present

## 2018-02-07 DIAGNOSIS — R1013 Epigastric pain: Secondary | ICD-10-CM | POA: Diagnosis not present

## 2018-02-07 DIAGNOSIS — R001 Bradycardia, unspecified: Secondary | ICD-10-CM | POA: Diagnosis present

## 2018-02-07 DIAGNOSIS — K810 Acute cholecystitis: Secondary | ICD-10-CM

## 2018-02-07 DIAGNOSIS — F329 Major depressive disorder, single episode, unspecified: Secondary | ICD-10-CM | POA: Diagnosis present

## 2018-02-07 DIAGNOSIS — Z01810 Encounter for preprocedural cardiovascular examination: Secondary | ICD-10-CM

## 2018-02-07 DIAGNOSIS — Z8249 Family history of ischemic heart disease and other diseases of the circulatory system: Secondary | ICD-10-CM | POA: Diagnosis not present

## 2018-02-07 DIAGNOSIS — K839 Disease of biliary tract, unspecified: Secondary | ICD-10-CM | POA: Diagnosis not present

## 2018-02-07 DIAGNOSIS — Z8719 Personal history of other diseases of the digestive system: Secondary | ICD-10-CM | POA: Diagnosis not present

## 2018-02-07 DIAGNOSIS — K8043 Calculus of bile duct with acute cholecystitis with obstruction: Secondary | ICD-10-CM | POA: Diagnosis present

## 2018-02-07 DIAGNOSIS — K3189 Other diseases of stomach and duodenum: Secondary | ICD-10-CM | POA: Diagnosis not present

## 2018-02-07 DIAGNOSIS — N179 Acute kidney failure, unspecified: Secondary | ICD-10-CM | POA: Diagnosis not present

## 2018-02-07 DIAGNOSIS — Z23 Encounter for immunization: Secondary | ICD-10-CM | POA: Diagnosis present

## 2018-02-07 DIAGNOSIS — D509 Iron deficiency anemia, unspecified: Secondary | ICD-10-CM | POA: Diagnosis present

## 2018-02-07 DIAGNOSIS — K8001 Calculus of gallbladder with acute cholecystitis with obstruction: Secondary | ICD-10-CM | POA: Diagnosis not present

## 2018-02-07 DIAGNOSIS — Z8673 Personal history of transient ischemic attack (TIA), and cerebral infarction without residual deficits: Secondary | ICD-10-CM | POA: Diagnosis not present

## 2018-02-07 DIAGNOSIS — I700 Atherosclerosis of aorta: Secondary | ICD-10-CM | POA: Diagnosis present

## 2018-02-07 DIAGNOSIS — K838 Other specified diseases of biliary tract: Secondary | ICD-10-CM | POA: Diagnosis not present

## 2018-02-07 DIAGNOSIS — E782 Mixed hyperlipidemia: Secondary | ICD-10-CM | POA: Diagnosis present

## 2018-02-07 DIAGNOSIS — K8000 Calculus of gallbladder with acute cholecystitis without obstruction: Secondary | ICD-10-CM | POA: Diagnosis present

## 2018-02-07 DIAGNOSIS — Z7982 Long term (current) use of aspirin: Secondary | ICD-10-CM | POA: Diagnosis not present

## 2018-02-07 DIAGNOSIS — I452 Bifascicular block: Secondary | ICD-10-CM | POA: Diagnosis present

## 2018-02-07 DIAGNOSIS — Z809 Family history of malignant neoplasm, unspecified: Secondary | ICD-10-CM | POA: Diagnosis not present

## 2018-02-07 DIAGNOSIS — Z6841 Body Mass Index (BMI) 40.0 and over, adult: Secondary | ICD-10-CM | POA: Diagnosis not present

## 2018-02-07 DIAGNOSIS — K5900 Constipation, unspecified: Secondary | ICD-10-CM | POA: Diagnosis not present

## 2018-02-07 DIAGNOSIS — Z87891 Personal history of nicotine dependence: Secondary | ICD-10-CM | POA: Diagnosis not present

## 2018-02-07 DIAGNOSIS — K8020 Calculus of gallbladder without cholecystitis without obstruction: Secondary | ICD-10-CM | POA: Diagnosis not present

## 2018-02-07 DIAGNOSIS — G629 Polyneuropathy, unspecified: Secondary | ICD-10-CM | POA: Diagnosis present

## 2018-02-07 DIAGNOSIS — Z79899 Other long term (current) drug therapy: Secondary | ICD-10-CM | POA: Diagnosis not present

## 2018-02-07 DIAGNOSIS — Z8261 Family history of arthritis: Secondary | ICD-10-CM | POA: Diagnosis not present

## 2018-02-07 LAB — COMPREHENSIVE METABOLIC PANEL
ALT: 11 U/L (ref 0–44)
AST: 15 U/L (ref 15–41)
Albumin: 3.3 g/dL — ABNORMAL LOW (ref 3.5–5.0)
Alkaline Phosphatase: 62 U/L (ref 38–126)
Anion gap: 6 (ref 5–15)
BUN: 13 mg/dL (ref 8–23)
CO2: 30 mmol/L (ref 22–32)
Calcium: 8.8 mg/dL — ABNORMAL LOW (ref 8.9–10.3)
Chloride: 105 mmol/L (ref 98–111)
Creatinine, Ser: 0.62 mg/dL (ref 0.44–1.00)
GFR calc Af Amer: >60 mL/min (ref >60)
GFR calc non Af Amer: >60 mL/min (ref >60)
Glucose, Bld: 101 mg/dL — ABNORMAL HIGH (ref 70–99)
Potassium: 3.9 mmol/L (ref 3.5–5.1)
Sodium: 141 mmol/L (ref 135–145)
Total Bilirubin: 0.6 mg/dL (ref 0.3–1.2)
Total Protein: 6.3 g/dL — ABNORMAL LOW (ref 6.5–8.1)

## 2018-02-07 LAB — TROPONIN I
Troponin I: <0.03 ng/mL (ref <0.03)
Troponin I: <0.03 ng/mL (ref <0.03)

## 2018-02-07 LAB — CBC
HCT: 34.5 % — ABNORMAL LOW (ref 36.0–46.0)
Hemoglobin: 10.7 g/dL — ABNORMAL LOW (ref 12.0–15.0)
MCH: 30.7 pg (ref 26.0–34.0)
MCHC: 31 g/dL (ref 30.0–36.0)
MCV: 99.1 fL (ref 80.0–100.0)
Platelets: 244 10*3/uL (ref 150–400)
RBC: 3.48 MIL/uL — ABNORMAL LOW (ref 3.87–5.11)
RDW: 14.8 % (ref 11.5–15.5)
WBC: 7.7 10*3/uL (ref 4.0–10.5)
nRBC: 0 % (ref 0.0–0.2)

## 2018-02-07 LAB — PROTIME-INR
INR: 1.07
Prothrombin Time: 13.8 seconds (ref 11.4–15.2)

## 2018-02-07 MED ORDER — PANTOPRAZOLE SODIUM 40 MG PO TBEC
40.0000 mg | DELAYED_RELEASE_TABLET | Freq: Every day | ORAL | Status: DC
  Administered 2018-02-08 – 2018-02-13 (×6): 40 mg via ORAL
  Filled 2018-02-07 (×6): qty 1

## 2018-02-07 MED ORDER — VITAMIN C 500 MG PO TABS
250.0000 mg | ORAL_TABLET | Freq: Every day | ORAL | Status: DC
  Administered 2018-02-07 – 2018-02-13 (×7): 250 mg via ORAL
  Filled 2018-02-07 (×7): qty 1

## 2018-02-07 MED ORDER — SODIUM CHLORIDE 0.9 % IV SOLN: 2.0000 g | INTRAVENOUS | Status: DC

## 2018-02-07 MED ORDER — SODIUM CHLORIDE 0.9 % IV SOLN
2.0000 g | INTRAVENOUS | Status: DC
  Administered 2018-02-07: 2 g via INTRAVENOUS
  Filled 2018-02-07: qty 20

## 2018-02-07 MED ORDER — HEPARIN SODIUM (PORCINE) 5000 UNIT/ML IJ SOLN
5000.0000 [IU] | Freq: Three times a day (TID) | INTRAMUSCULAR | Status: DC
  Administered 2018-02-07 – 2018-02-08 (×3): 5000 [IU] via SUBCUTANEOUS
  Filled 2018-02-07 (×3): qty 1

## 2018-02-07 NOTE — Consult Note (Signed)
[facility] Consult: 1:55 PM 02/07/2018  LOS: 0 days    Referring Provider: Dr Kerry Hough Primary Care Physician:  Benita Stabile, MD [place]. Primary Gastroenterologist: Gentry Fitz, transfer patient from [facility]    Reason for Consultation:  CBD stone.     HPI: Dawn Gonzales is a 77 y.o. female.  Morbidly obese.  Recently moved to [facility].  Depression.  Severe Ao stenosis.  Right bundle branch block.  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  Neuropathy.  CVA 2018, not on antiplatelet meds.    (Presumed) cholecystitis in December 2018.  Durene Fruits PERC cholecystostomy for about 6 weeks, removed in January 2019.  Surgery was not pursued.   Patient has had 3 or 4 colonoscopies in her life starting around age 83 through age 10.  She has had colon polyps.  Told she was anemic in the past and should take iron but she does not take iron.  Patient relocated with her husband to [place] about 4 months ago  Digestive/GI wise the patient has done well since the cholecystostomy tube.  She eats well.  She has bowel movements about every other day.  No nausea.  No weight fluctuation.  No abdominal pain. 4 days ago she developed nausea and bloating but did not vomit.  No abdominal pain but does have abdominal tenderness to palpation in the upper abdomen.  No fever or chills.  The symptoms are similar to her presentation last year leading up to the cholecystostomy.  In other words she did not have significant abdominal pain, just nausea then as now.  Ultrasound: Cholelithiasis, GB wall thickening, positive sonographic Murphy's sign concerning for acute cholecystitis.  CBD 11mm concerning for distal common bile duct obstruction. Correlation with liver function tests and MRCP is recommended.  MRCP: 1. Mild to moderate motion  degradation. 2. Cholelithiasis with gallbladder wall thickening, again suspicious for acute cholecystitis. 3. Biliary duct dilatation with a subtle 7 mm distal common duct stone. 4. Lower pole right renal lesion which is suboptimally evaluated, but most likely represents a complex cyst. Consider renal ultrasound follow-up at 1 year. 5. Left renal hemorrhagic/proteinaceous cyst. LFTs, Lipase, WBCs, electrolytes, renal fx, troponins all normal.   Hgb 10.7.  MCV 99  Seen by cardiology at [facility].  Echocardiogram 10/4 which demonstrated the severe aortic stenosis.  Preoperative evaluation includes that she is moderate to high risk for surgical intervention.  Aortic stenosis increases perioperative bleeding risk.  Dr. Diona Browner suggested transfer to [facility] for GI procedures specifically ERCP.   Takes baby aspirin daily but no blood thinners.  No PPI or H2 blockers.  Occasionally uses MiraLAX for constipation    Family history negative for gallbladder disease other than in some uncles.  No family history of colon cancer.  Does not drink alcohol.  Past Medical History:  Diagnosis Date  . Depression   . Heart murmur   . History of cellulitis   . History of endocarditis   . Hyperlipemia   . Neuropathy     Past Surgical History:  Procedure  Laterality Date  . Abdominal gangrene    . ADENOIDECTOMY    . Cataract surgery    . COLONOSCOPY    . TONSILLECTOMY    . Tummy tuck      Prior to Admission medications   Medication Sig Start Date End Date Taking? Authorizing Provider  Ascorbic Acid (VITAMIN C) 100 MG tablet Take 100 mg by mouth daily.   Yes [provider]  aspirin EC 81 MG tablet Take 81 mg by mouth daily.   Yes [provider]  Calcium Carbonate (CALCIUM-CARB 600 PO) Take 600 mg by mouth daily.   Yes [provider]  cholecalciferol (VITAMIN D) 1000 units tablet Take 1,000 Units by mouth daily.   Yes [provider]  ferrous  sulfate 325 (65 FE) MG tablet Take 325 mg by mouth daily with breakfast.   Yes [provider]  gabapentin (NEURONTIN) 300 MG capsule Take 300 mg by mouth 3 (three) times daily.   Yes [provider]  Multiple Vitamin (MULTIVITAMIN WITH MINERALS) TABS tablet Take 1 tablet by mouth daily.   Yes [provider]  nystatin ointment (MYCOSTATIN) Apply 1 application topically 2 (two) times daily as needed. 01/26/18  Yes [provider]  pravastatin (PRAVACHOL) 20 MG tablet Take 20 mg by mouth daily.   Yes [provider]  venlafaxine (EFFEXOR) 37.5 MG tablet Take 37.5 mg by mouth daily.   Yes [provider]    Scheduled Meds: . aspirin EC  81 mg Oral Daily  . calcium carbonate  1,250 mg Oral Daily  . cholecalciferol  1,000 Units Oral Daily  . ferrous sulfate  325 mg Oral Q breakfast  . gabapentin  300 mg Oral TID  . heparin injection (subcutaneous)  5,000 Units Subcutaneous Q8H  . mouth rinse  15 mL Mouth Rinse BID  . multivitamin with minerals  1 tablet Oral Daily  . pantoprazole  40 mg Oral BID  . pravastatin  20 mg Oral q1800  . sodium chloride flush  3 mL Intravenous Q12H  . venlafaxine  37.5 mg Oral Daily  . vitamin C  250 mg Oral Daily   Infusions: . sodium chloride    . sodium chloride    . cefTRIAXone (ROCEPHIN)  IV     PRN Meds: sodium chloride, sodium chloride, acetaminophen **OR** acetaminophen, hydrALAZINE, morphine injection, ondansetron (ZOFRAN) IV, sodium chloride flush   Allergies as of 02/06/2018  . (No Known Allergies)    Family History  Problem Relation Age of Onset  . Cancer Mother   . Hypertension Father   . Arthritis Father     Social History   Socioeconomic History  . Marital status: Married    Spouse name: Not on file  . Number of children: Not on file  . Years of education: Not on file  . Highest education level: Not on file  Occupational History  . Not on file  Social Needs  . Financial  resource strain: Not on file  . Food insecurity:    Worry: Not on file    Inability: Not on file  . Transportation needs:    Medical: Not on file    Non-medical: Not on file  Tobacco Use  . Smoking status: Former Smoker    Last attempt to quit: 05/23/1978    Years since quitting: 39.7  . Smokeless tobacco: Never Used  . Tobacco comment: only 1 pack pewr week   Substance and Sexual Activity  . Alcohol use: Yes  Comment: Occasional  . Drug use: Never  . Sexual activity: Not on file  Lifestyle  . Physical activity:    Days per week: Not on file    Minutes per session: Not on file  . Stress: Not on file  Relationships  . Social connections:    Talks on phone: Not on file    Gets together: Not on file    Attends religious service: Not on file    Active member of club or organization: Not on file    Attends meetings of clubs or organizations: Not on file    Relationship status: Not on file  . Intimate partner violence:    Fear of current or ex partner: Not on file    Emotionally abused: Not on file    Physically abused: Not on file    Forced sexual activity: Not on file  Other Topics Concern  . Not on file  Social History Narrative  . Not on file    REVIEW OF SYSTEMS: Constitutional: Patient is not very active.  She uses a walker to transfer from furniture to her wheelchair.  She is afraid of falling ENT:  No nose bleeds Pulm: No dyspnea at rest.  No cough.  Has never been tested for sleep apnea. CV:  No palpitations, no chest pain.  +  LE edema. GU:  No hematuria, no frequency, no amber or dark-colored urine. GI: Per HPI. Heme: Bruises somewhat easily but no unusual bleeding or excessive bruising. Transfusions: None. Neuro:  No headaches, no peripheral tingling or numbness Derm:  No itching, no rash or sores.  Endocrine:  No sweats or chills.  No polyuria or dysuria Immunization: Not queried Travel:  None beyond local counties in last few months.    PHYSICAL  EXAM: Vital signs in last 24 hours: Vitals:   02/07/18 0657 02/07/18 1335  BP: 114/63 139/72  Pulse: 79 77  Resp: 20 20  Temp: 98.4 F (36.9 C) 98.6 F (37 C)  SpO2: 97% 93%   Wt Readings from Last 3 Encounters:  02/07/18 122.3 kg  01/21/18 124.3 kg    General: Obese, pleasant, comfortable, chronically ill appearing and somewhat pale WF.  Good historian. Head: No facial asymmetry or swelling.  No signs of head trauma. Eyes: No scleral icterus or conjunctival pallor.  EOMI. Ears: Not hard of hearing Nose: No congestion or discharge Mouth: Oropharynx moist, clear, pink.  Tongue midline.  Poor dentition with caries and missing teeth Neck: No JVD, no masses, no thyromegaly. Lungs: Diminished breath sounds at the bases with a few fine crackles.  No labored breathing or cough. Heart: RRR.  S1, S2 present.  3/6 systolic, harsh murmur. Abdomen: Obese.  Not tender.  Not distended.  Large pannus.  Well-healed scar right upper quadrant consistent with cholecystostomy tube site.  No HSM, masses, bruits, hernias..   Rectal: Deferred Musc/Skeltl: No joint redness, swelling or significant contractures.  Arthritic changes in the fingers. Extremities: Chronic appearing edema bilaterally in the lower legs consistent with venous stasis.  1+ pedal pulses. Neurologic: Alert.  Oriented x3.  Fluid speech.  No gross weakness or deficits. Skin: Telangiectasia, rashes, sores.  Few purpura on the arms. Tattoos: None Nodes: No cervical adenopathy. Psych: Pleasant, cooperative, not anxious or depressed.  Intake/Output from previous day: 10/09 0701 - 10/10 0700 In: 100 [IV Piggyback:100] Out: -  Intake/Output this shift: Total I/O In: 240 [P.O.:240] Out: 1150 [Urine:1150]  LAB RESULTS: Recent Labs    02/06/18 1251 02/07/18  0513  WBC 9.2 7.7  HGB 11.7* 10.7*  HCT 38.5 34.5*  PLT 255 244   BMET Lab Results  Component Value Date   NA 141 02/07/2018   NA 139 02/06/2018   K 3.9 02/07/2018    K 3.8 02/06/2018   CL 105 02/07/2018   CL 105 02/06/2018   CO2 30 02/07/2018   CO2 26 02/06/2018   GLUCOSE 101 (H) 02/07/2018   GLUCOSE 108 (H) 02/06/2018   BUN 13 02/07/2018   BUN 18 02/06/2018   CREATININE 0.62 02/07/2018   CREATININE 0.60 02/06/2018   CALCIUM 8.8 (L) 02/07/2018   CALCIUM 9.2 02/06/2018   LFT Recent Labs    02/06/18 1251 02/07/18 0513  PROT 6.8 6.3*  ALBUMIN 3.6 3.3*  AST 17 15  ALT 12 11  ALKPHOS 69 62  BILITOT 0.6 0.6   PT/INR No results found for: INR, PROTIME Hepatitis Panel No results for input(s): HEPBSAG, HCVAB, HEPAIGM, HEPBIGM in the last 72 hours. C-Diff No components found for: CDIFF Lipase     Component Value Date/Time   LIPASE 23 02/06/2018 1251    Drugs of Abuse  No results found for: LABOPIA, COCAINSCRNUR, LABBENZ, AMPHETMU, THCU, LABBARB   RADIOLOGY STUDIES: Dg Chest 2 View  Result Date: 02/06/2018 CLINICAL DATA:  Three days of epigastric pain which worsened this morning. History of CHF, former smoker. EXAM: CHEST - 2 VIEW COMPARISON:  None. FINDINGS: The lungs are mildly hypoinflated. The lung markings are coarse in the retrocardiac region and are accentuated by hypoinflation. The cardiac silhouette is enlarged. The pulmonary vascularity is normal. There is calcification in the wall of the aortic arch. The bony thorax exhibits no acute abnormality. The bowel gas pattern in the upper abdomen is unremarkable. IMPRESSION: Cardiomegaly without pulmonary vascular congestion or pulmonary edema. Coarse lung markings in the retrocardiac region may reflect atelectasis or early pneumonia though I favor atelectasis. Thoracic aortic atherosclerosis. Electronically Signed   By: David  [place] M.D.   On: 02/06/2018 14:14   Mr 3d Recon At Scanner  Result Date: 02/06/2018 CLINICAL DATA:  Cholelithiasis. Epigastric pain for 3 days with nausea. History CHF. Prior "tummy tuck". EXAM: MRI ABDOMEN WITHOUT AND WITH CONTRAST (INCLUDING MRCP) TECHNIQUE:  Multiplanar multisequence MR imaging of the abdomen was performed both before and after the administration of intravenous contrast. Heavily T2-weighted images of the biliary and pancreatic ducts were obtained, and three-dimensional MRCP images were rendered by post processing. CONTRAST:  10 cc Gadavist COMPARISON:  Ultrasound earlier today. FINDINGS: Mild to moderate degradation throughout secondary to patient body habitus and motion. Lower chest: Mild cardiomegaly, without pericardial or pleural effusion. Hepatobiliary: High left hepatic lobe cyst. Gallstones including at 3.0 cm. Gallbladder wall thickening, including at 10 mm on image 27/5. Mild intrahepatic biliary duct dilatation. The common duct measures 1.6 cm in the porta hepatis. 7 mm focus of T2 hypointensity in the distal common duct on images 15/3. This is felt to be confirmed on motion degraded dedicated MRCP image 92/9. Pancreas:  Normal, without mass or ductal dilatation. Spleen:  Normal in size, without focal abnormality. Adrenals/Urinary Tract: Normal adrenal glands. An upper pole left renal 2.0 cm lesion medially is consistent with a hemorrhagic/proteinaceous cyst. A lower pole right renal lesion measures 1.3 cm on image 10/3 and is not well evaluated on post-contrast images, including image 64/5007. Stomach/Bowel: Normal stomach and abdominal bowel loops. Vascular/Lymphatic: Normal caliber of the aorta and branch vessels. No abdominal adenopathy. Other:  No ascites. Musculoskeletal: Convex left  lumbar spine curvature. IMPRESSION: 1. Mild to moderate motion degradation. 2. Cholelithiasis with gallbladder wall thickening, again suspicious for acute cholecystitis. 3. Biliary duct dilatation with a subtle 7 mm distal common duct stone. 4. Lower pole right renal lesion which is suboptimally evaluated, but most likely represents a complex cyst. Consider renal ultrasound follow-up at 1 year. 5. Left renal hemorrhagic/proteinaceous cyst. Electronically  Signed   By: Jeronimo Greaves M.D.   On: 02/06/2018 18:15   US Abdomen Limited  Result Date: 02/06/2018 CLINICAL DATA:  Acute right upper quadrant abdominal pain. EXAM: ULTRASOUND ABDOMEN LIMITED RIGHT UPPER QUADRANT COMPARISON:  None. FINDINGS: Gallbladder: Multiple gallstones are noted with the largest measuring 2.5 cm. Gallbladder wall thickening is noted at 7 mm. Positive sonographic Murphy's sign is noted. Sludge is noted within gallbladder lumen. Common bile duct: Diameter: 11 mm which is dilated and concerning for distal common bile duct obstruction. Liver: No focal lesion identified. Within normal limits in parenchymal echogenicity. Portal vein is patent on color Doppler imaging with normal direction of blood flow towards the liver. IMPRESSION: Cholelithiasis is noted with moderate gallbladder wall thickening and positive sonographic Murphy's sign concerning for acute cholecystitis. Common bile duct dilatation is noted concerning for distal common bile duct obstruction. Correlation with liver function tests and MRCP is recommended. Electronically Signed   By: Lupita Raider, M.D.   On: 02/06/2018 13:55   Mr Abdomen Mrcp Vivien Rossetti Contast  Result Date: 02/06/2018 CLINICAL DATA:  Cholelithiasis. Epigastric pain for 3 days with nausea. History CHF. Prior "tummy tuck". EXAM: MRI ABDOMEN WITHOUT AND WITH CONTRAST (INCLUDING MRCP) TECHNIQUE: Multiplanar multisequence MR imaging of the abdomen was performed both before and after the administration of intravenous contrast. Heavily T2-weighted images of the biliary and pancreatic ducts were obtained, and three-dimensional MRCP images were rendered by post processing. CONTRAST:  10 cc Gadavist COMPARISON:  Ultrasound earlier today. FINDINGS: Mild to moderate degradation throughout secondary to patient body habitus and motion. Lower chest: Mild cardiomegaly, without pericardial or pleural effusion. Hepatobiliary: High left hepatic lobe cyst. Gallstones including at 3.0  cm. Gallbladder wall thickening, including at 10 mm on image 27/5. Mild intrahepatic biliary duct dilatation. The common duct measures 1.6 cm in the porta hepatis. 7 mm focus of T2 hypointensity in the distal common duct on images 15/3. This is felt to be confirmed on motion degraded dedicated MRCP image 92/9. Pancreas:  Normal, without mass or ductal dilatation. Spleen:  Normal in size, without focal abnormality. Adrenals/Urinary Tract: Normal adrenal glands. An upper pole left renal 2.0 cm lesion medially is consistent with a hemorrhagic/proteinaceous cyst. A lower pole right renal lesion measures 1.3 cm on image 10/3 and is not well evaluated on post-contrast images, including image 64/5007. Stomach/Bowel: Normal stomach and abdominal bowel loops. Vascular/Lymphatic: Normal caliber of the aorta and branch vessels. No abdominal adenopathy. Other:  No ascites. Musculoskeletal: Convex left lumbar spine curvature. IMPRESSION: 1. Mild to moderate motion degradation. 2. Cholelithiasis with gallbladder wall thickening, again suspicious for acute cholecystitis. 3. Biliary duct dilatation with a subtle 7 mm distal common duct stone. 4. Lower pole right renal lesion which is suboptimally evaluated, but most likely represents a complex cyst. Consider renal ultrasound follow-up at 1 year. 5. Left renal hemorrhagic/proteinaceous cyst. Electronically Signed   By: Jeronimo Greaves M.D.   On: 02/06/2018 18:15     IMPRESSION:   *   Choledocholithiasis.  Interestingly her LFTs have been normal x 2.    *    Cholelithiasis.  Presume she had cholecystitis December 2018 at which time she underwent percutaneous cholecystostomy tube placement, removed after about 6 weeks.    *   Severe aortic stenosis.  Increased risk for sedation.  At rest she is well compensated.    PLAN:     *    Patient has scheduled ERCP for noon tomorrow with Dr. Meridee Score.   Discussed risks of sedation, bleeding, pancreatitis, inability to complete  procedure and possible need for IR to perform intervention.  She and her daughter understand and are willing to proceed.  *    LFTs, PT/INR in the morning  *   Continue Rocephin for the time being.   Jennye Moccasin  02/07/2018, 1:55 PM Phone [phone]  Attending Attestation:  I have independently reviewed the history, performed a physical examination and agree with above full consultation, assessment and plan as detailed by PA Gribbin. Her daughter was present at the time of my evaluation.  ERCP recommended for recurrent, symptomatic choledocholithiasis. LFTs are normal. Cardiology consultation recommended prior to ERCP given her severe aortic stenosis and to consider her candidacy for surgery. If appropriate, surgical consultation recommended following ERCP.    I consented the patient at the bedside today discussing the risks, benefits, and alternatives to endoscopic evaluation. In particular, we discussed the risks that include, but are not limited to, reaction to medication, cardiopulmonary compromise, bleeding requiring blood transfusion, aspiration resulting in pneumonia, perforation requiring surgery, pancreatitis that can result in prolonged hospitalization as well as multiorgan dysfunction, and even death. The patient acknowledges these risks and asks that we proceed. The procedure has been tentatively scheduled for tomorrow. NPO at midnight. Will obtain INR in preparation for the procedure.   Thank for this consultation and for allowing me to participate in this patient's care. Please do not hesitate to call with any questions or concerns. We will continue to follow with you.

## 2018-02-07 NOTE — Consult Note (Addendum)
Reason for Consult: Right upper quadrant abdominal pain, cholelithiasis Referring Physician: Dr. Clayborn Bigness Dawn Gonzales is an 77 y.o. female.  HPI: Patient is a 77 year old white female who was admitted to the hospital on 02/06/2018 with worsening nausea and upper abdominal pain.  Ultrasound gallbladder revealed cholelithiasis, positive Murphy sign, and a dilated common bile duct.  She was admitted to the hospital for further evaluation and treatment.  She states that today she does not have as much right upper quadrant abdominal pain.  Interestingly, she was admitted to a hospital in [place] 1 year ago and underwent cholecystostomy tube placement.  It was in place for 1 month.  She does not know why they did not operate at that time.  An echo was done of her heart yesterday which reveals severe aortic stenosis with a normal ejection fraction.  She denies any fever, chills, jaundice this morning.  She currently has minimal abdominal pain.  Past Medical History:  Diagnosis Date  . Depression   . Heart murmur   . History of cellulitis   . History of endocarditis   . Hyperlipemia   . Neuropathy     Past Surgical History:  Procedure Laterality Date  . Abdominal gangrene    . ADENOIDECTOMY    . Cataract surgery    . COLONOSCOPY    . TONSILLECTOMY    . Tummy tuck      Family History  Problem Relation Age of Onset  . Cancer Mother   . Hypertension Father   . Arthritis Father     Social History:  reports that she quit smoking about 39 years ago. She has never used smokeless tobacco. She reports that she drinks alcohol. She reports that she does not use drugs.  Allergies: No Known Allergies  Medications: I have reviewed the patient's current medications.  Results for orders placed or performed during the hospital encounter of 02/06/18 (from the past 48 hour(s))  Urinalysis, Routine w reflex microscopic     Status: Abnormal   Collection Time: 02/06/18 12:27 PM  Result Value Ref  Range   Color, Urine STRAW (A) YELLOW   APPearance CLEAR CLEAR   Specific Gravity, Urine 1.005 1.005 - 1.030   pH 7.0 5.0 - 8.0   Glucose, UA NEGATIVE NEGATIVE mg/dL   Hgb urine dipstick NEGATIVE NEGATIVE   Bilirubin Urine NEGATIVE NEGATIVE   Ketones, ur NEGATIVE NEGATIVE mg/dL   Protein, ur NEGATIVE NEGATIVE mg/dL   Nitrite NEGATIVE NEGATIVE   Leukocytes, UA NEGATIVE NEGATIVE    Comment: Performed at [facility], [address], [place], [place] [address]  CBC with Differential/Platelet     Status: Abnormal   Collection Time: 02/06/18 12:51 PM  Result Value Ref Range   WBC 9.2 4.0 - 10.5 K/uL   RBC 3.93 3.87 - 5.11 MIL/uL   Hemoglobin 11.7 (L) 12.0 - 15.0 g/dL   HCT 38.5 36.0 - 46.0 %   MCV 98.0 80.0 - 100.0 fL   MCH 29.8 26.0 - 34.0 pg   MCHC 30.4 30.0 - 36.0 g/dL   RDW 14.6 11.5 - 15.5 %   Platelets 255 150 - 400 K/uL   nRBC 0.0 0.0 - 0.2 %   Neutrophils Relative % 76 %   Neutro Abs 6.9 1.7 - 7.7 K/uL   Lymphocytes Relative 16 %   Lymphs Abs 1.5 0.7 - 4.0 K/uL   Monocytes Relative 6 %   Monocytes Absolute 0.6 0.1 - 1.0 K/uL   Eosinophils Relative 2 %  Eosinophils Absolute 0.2 0.0 - 0.5 K/uL   Basophils Relative 0 %   Basophils Absolute 0.0 0.0 - 0.1 K/uL   Immature Granulocytes 0 %   Abs Immature Granulocytes 0.02 0.00 - 0.07 K/uL    Comment: Performed at [facility], [address] [place], [place] [address]  Comprehensive metabolic panel     Status: Abnormal   Collection Time: 02/06/18 12:51 PM  Result Value Ref Range   Sodium 139 135 - 145 mmol/L   Potassium 3.8 3.5 - 5.1 mmol/L   Chloride 105 98 - 111 mmol/L   CO2 26 22 - 32 mmol/L   Glucose, Bld 108 (H) 70 - 99 mg/dL   BUN 18 8 - 23 mg/dL   Creatinine, Ser 0.60 0.44 - 1.00 mg/dL   Calcium 9.2 8.9 - 10.3 mg/dL   Total Protein 6.8 6.5 - 8.1 g/dL   Albumin 3.6 3.5 - 5.0 g/dL   AST 17 15 - 41 U/L   ALT 12 0 - 44 U/L   Alkaline Phosphatase 69 38 - 126 U/L   Total Bilirubin 0.6 0.3 - 1.2 mg/dL   GFR calc  non Af Amer >60 >60 mL/min   GFR calc Af Amer >60 >60 mL/min    Comment: (NOTE) The eGFR has been calculated using the CKD EPI equation. This calculation has not been validated in all clinical situations. eGFR's persistently <60 mL/min signify possible Chronic Kidney Disease.    Anion gap 8 5 - 15    Comment: Performed at [facility], [address], [place], [place] [address]  Troponin I     Status: None   Collection Time: 02/06/18 12:51 PM  Result Value Ref Range   Troponin I <0.03 <0.03 ng/mL    Comment: Performed at [facility], [address] [place], [place] [address]  Lipase, blood     Status: None   Collection Time: 02/06/18 12:51 PM  Result Value Ref Range   Lipase 23 11 - 51 U/L    Comment: Performed at [facility], [address] [place], [place] [address]  Troponin I (q 6hr x 3)     Status: None   Collection Time: 02/06/18  6:19 PM  Result Value Ref Range   Troponin I <0.03 <0.03 ng/mL    Comment: Performed at [facility], [address] [place], [place] [address]  Troponin I (q 6hr x 3)     Status: None   Collection Time: 02/06/18 11:02 PM  Result Value Ref Range   Troponin I <0.03 <0.03 ng/mL    Comment: Performed at [facility], [address] [place], [place] [address]  Troponin I (q 6hr x 3)     Status: None   Collection Time: 02/07/18  5:13 AM  Result Value Ref Range   Troponin I <0.03 <0.03 ng/mL    Comment: Performed at [facility], [address] [place], [place] [address]  Comprehensive metabolic panel     Status: Abnormal   Collection Time: 02/07/18  5:13 AM  Result Value Ref Range   Sodium 141 135 - 145 mmol/L   Potassium 3.9 3.5 - 5.1 mmol/L   Chloride 105 98 - 111 mmol/L   CO2 30 22 - 32 mmol/L   Glucose, Bld 101 (H) 70 - 99 mg/dL   BUN 13 8 - 23 mg/dL   Creatinine, Ser 0.62 0.44 - 1.00 mg/dL   Calcium 8.8 (L) 8.9 - 10.3 mg/dL   Total Protein 6.3 (L) 6.5 -  8.1 g/dL   Albumin 3.3 (L) 3.5 - 5.0 g/dL   AST 15 15 - 41 U/L   ALT 11 0 - 44 U/L    Alkaline Phosphatase 62 38 - 126 U/L   Total Bilirubin 0.6 0.3 - 1.2 mg/dL   GFR calc non Af Amer >60 >60 mL/min   GFR calc Af Amer >60 >60 mL/min    Comment: (NOTE) The eGFR has been calculated using the CKD EPI equation. This calculation has not been validated in all clinical situations. eGFR's persistently <60 mL/min signify possible Chronic Kidney Disease.    Anion gap 6 5 - 15    Comment: Performed at [facility], [address] [place], [place] [address]  CBC     Status: Abnormal   Collection Time: 02/07/18  5:13 AM  Result Value Ref Range   WBC 7.7 4.0 - 10.5 K/uL   RBC 3.48 (L) 3.87 - 5.11 MIL/uL   Hemoglobin 10.7 (L) 12.0 - 15.0 g/dL   HCT 34.5 (L) 36.0 - 46.0 %   MCV 99.1 80.0 - 100.0 fL   MCH 30.7 26.0 - 34.0 pg   MCHC 31.0 30.0 - 36.0 g/dL   RDW 14.8 11.5 - 15.5 %   Platelets 244 150 - 400 K/uL   nRBC 0.0 0.0 - 0.2 %    Comment: Performed at [facility], [address], [place], [place] [address]    Dg Chest 2 View  Result Date: 02/06/2018 CLINICAL DATA:  Three days of epigastric pain which worsened this morning. History of CHF, former smoker. EXAM: CHEST - 2 VIEW COMPARISON:  None. FINDINGS: The lungs are mildly hypoinflated. The lung markings are coarse in the retrocardiac region and are accentuated by hypoinflation. The cardiac silhouette is enlarged. The pulmonary vascularity is normal. There is calcification in the wall of the aortic arch. The bony thorax exhibits no acute abnormality. The bowel gas pattern in the upper abdomen is unremarkable. IMPRESSION: Cardiomegaly without pulmonary vascular congestion or pulmonary edema. Coarse lung markings in the retrocardiac region may reflect atelectasis or early pneumonia though I favor atelectasis. Thoracic aortic atherosclerosis. Electronically Signed   By: David  Martinique M.D.   On: 02/06/2018 14:14   Mr 3d Recon At Scanner  Result Date: 02/06/2018 CLINICAL DATA:  Cholelithiasis. Epigastric pain for 3 days with  nausea. History CHF. Prior "tummy tuck". EXAM: MRI ABDOMEN WITHOUT AND WITH CONTRAST (INCLUDING MRCP) TECHNIQUE: Multiplanar multisequence MR imaging of the abdomen was performed both before and after the administration of intravenous contrast. Heavily T2-weighted images of the biliary and pancreatic ducts were obtained, and three-dimensional MRCP images were rendered by post processing. CONTRAST:  10 cc Gadavist COMPARISON:  Ultrasound earlier today. FINDINGS: Mild to moderate degradation throughout secondary to patient body habitus and motion. Lower chest: Mild cardiomegaly, without pericardial or pleural effusion. Hepatobiliary: High left hepatic lobe cyst. Gallstones including at 3.0 cm. Gallbladder wall thickening, including at 10 mm on image 27/5. Mild intrahepatic biliary duct dilatation. The common duct measures 1.6 cm in the porta hepatis. 7 mm focus of T2 hypointensity in the distal common duct on images 15/3. This is felt to be confirmed on motion degraded dedicated MRCP image 92/9. Pancreas:  Normal, without mass or ductal dilatation. Spleen:  Normal in size, without focal abnormality. Adrenals/Urinary Tract: Normal adrenal glands. An upper pole left renal 2.0 cm lesion medially is consistent with a hemorrhagic/proteinaceous cyst. A lower pole right renal lesion measures 1.3 cm on image 10/3 and is not  well evaluated on post-contrast images, including image 64/5007. Stomach/Bowel: Normal stomach and abdominal bowel loops. Vascular/Lymphatic: Normal caliber of the aorta and branch vessels. No abdominal adenopathy. Other:  No ascites. Musculoskeletal: Convex left lumbar spine curvature. IMPRESSION: 1. Mild to moderate motion degradation. 2. Cholelithiasis with gallbladder wall thickening, again suspicious for acute cholecystitis. 3. Biliary duct dilatation with a subtle 7 mm distal common duct stone. 4. Lower pole right renal lesion which is suboptimally evaluated, but most likely represents a complex  cyst. Consider renal ultrasound follow-up at 1 year. 5. Left renal hemorrhagic/proteinaceous cyst. Electronically Signed   By: Abigail Miyamoto M.D.   On: 02/06/2018 18:15   US Abdomen Limited  Result Date: 02/06/2018 CLINICAL DATA:  Acute right upper quadrant abdominal pain. EXAM: ULTRASOUND ABDOMEN LIMITED RIGHT UPPER QUADRANT COMPARISON:  None. FINDINGS: Gallbladder: Multiple gallstones are noted with the largest measuring 2.5 cm. Gallbladder wall thickening is noted at 7 mm. Positive sonographic Murphy's sign is noted. Sludge is noted within gallbladder lumen. Common bile duct: Diameter: 11 mm which is dilated and concerning for distal common bile duct obstruction. Liver: No focal lesion identified. Within normal limits in parenchymal echogenicity. Portal vein is patent on color Doppler imaging with normal direction of blood flow towards the liver. IMPRESSION: Cholelithiasis is noted with moderate gallbladder wall thickening and positive sonographic Murphy's sign concerning for acute cholecystitis. Common bile duct dilatation is noted concerning for distal common bile duct obstruction. Correlation with liver function tests and MRCP is recommended. Electronically Signed   By: Marijo Conception, M.D.   On: 02/06/2018 13:55   Mr Abdomen Mrcp Moise Boring Contast  Result Date: 02/06/2018 CLINICAL DATA:  Cholelithiasis. Epigastric pain for 3 days with nausea. History CHF. Prior "tummy tuck". EXAM: MRI ABDOMEN WITHOUT AND WITH CONTRAST (INCLUDING MRCP) TECHNIQUE: Multiplanar multisequence MR imaging of the abdomen was performed both before and after the administration of intravenous contrast. Heavily T2-weighted images of the biliary and pancreatic ducts were obtained, and three-dimensional MRCP images were rendered by post processing. CONTRAST:  10 cc Gadavist COMPARISON:  Ultrasound earlier today. FINDINGS: Mild to moderate degradation throughout secondary to patient body habitus and motion. Lower chest: Mild  cardiomegaly, without pericardial or pleural effusion. Hepatobiliary: High left hepatic lobe cyst. Gallstones including at 3.0 cm. Gallbladder wall thickening, including at 10 mm on image 27/5. Mild intrahepatic biliary duct dilatation. The common duct measures 1.6 cm in the porta hepatis. 7 mm focus of T2 hypointensity in the distal common duct on images 15/3. This is felt to be confirmed on motion degraded dedicated MRCP image 92/9. Pancreas:  Normal, without mass or ductal dilatation. Spleen:  Normal in size, without focal abnormality. Adrenals/Urinary Tract: Normal adrenal glands. An upper pole left renal 2.0 cm lesion medially is consistent with a hemorrhagic/proteinaceous cyst. A lower pole right renal lesion measures 1.3 cm on image 10/3 and is not well evaluated on post-contrast images, including image 64/5007. Stomach/Bowel: Normal stomach and abdominal bowel loops. Vascular/Lymphatic: Normal caliber of the aorta and branch vessels. No abdominal adenopathy. Other:  No ascites. Musculoskeletal: Convex left lumbar spine curvature. IMPRESSION: 1. Mild to moderate motion degradation. 2. Cholelithiasis with gallbladder wall thickening, again suspicious for acute cholecystitis. 3. Biliary duct dilatation with a subtle 7 mm distal common duct stone. 4. Lower pole right renal lesion which is suboptimally evaluated, but most likely represents a complex cyst. Consider renal ultrasound follow-up at 1 year. 5. Left renal hemorrhagic/proteinaceous cyst. Electronically Signed   By: Abigail Miyamoto  M.D.   On: 02/06/2018 18:15    ROS:  Pertinent items are noted in HPI.  Blood pressure 114/63, pulse 79, temperature 98.4 F (36.9 C), temperature source Oral, resp. rate 20, height _0  (1.676 m), weight 122.3 kg, SpO2 97 %. Physical Exam: Pleasant white female no acute distress Head is normocephalic, atraumatic Eyes without scleral icterus Lungs clear to auscultation with equal breath sounds bilaterally Heart  examination reveals a 4 out of 6 parasternal ejection murmur.  No S3, S4 noted.  Regular rate and rhythm. Abdomen soft with minimal tenderness to deep palpation in the right upper quadrant.  No rigidity is noted.  Bowel sounds are present.  Ultrasound report reviewed, echo report reviewed  Assessment/Plan: Impression: Biliary colic, cholelithiasis, dilated common bile duct, choledocholithiasis, severe aortic stenosis Plan: Awaiting GI consultation.  Cardiology consultation has also been obtained due to the patient's severe aortic stenosis. Dawn Gonzales 02/07/2018, 8:09 AM

## 2018-02-07 NOTE — H&P (View-Only) (Signed)
[facility] Consult: 1:55 PM 02/07/2018  LOS: 0 days    Referring Provider: Dr Kerry Hough Primary Care Physician:  Benita Stabile, MD [place]. Primary Gastroenterologist: Gentry Fitz, transfer patient from [facility]    Reason for Consultation:  CBD stone.     HPI: Dawn Gonzales is a 77 y.o. female.  Morbidly obese.  Recently moved to [facility].  Depression.  Severe Ao stenosis.  Right bundle branch block.  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  Neuropathy.  CVA 2018, not on antiplatelet meds.    (Presumed) cholecystitis in December 2018.  Durene Fruits PERC cholecystostomy for about 6 weeks, removed in January 2019.  Surgery was not pursued.   Patient has had 3 or 4 colonoscopies in her life starting around age 23 through age 41.  She has had colon polyps.  Told she was anemic in the past and should take iron but she does not take iron.  Patient relocated with her husband to [place] about 4 months ago  Digestive/GI wise the patient has done well since the cholecystostomy tube.  She eats well.  She has bowel movements about every other day.  No nausea.  No weight fluctuation.  No abdominal pain. 4 days ago she developed nausea and bloating but did not vomit.  No abdominal pain but does have abdominal tenderness to palpation in the upper abdomen.  No fever or chills.  The symptoms are similar to her presentation last year leading up to the cholecystostomy.  In other words she did not have significant abdominal pain, just nausea then as now.  Ultrasound: Cholelithiasis, GB wall thickening, positive sonographic Murphy's sign concerning for acute cholecystitis.  CBD 11mm concerning for distal common bile duct obstruction. Correlation with liver function tests and MRCP is recommended.  MRCP: 1. Mild to moderate motion  degradation. 2. Cholelithiasis with gallbladder wall thickening, again suspicious for acute cholecystitis. 3. Biliary duct dilatation with a subtle 7 mm distal common duct stone. 4. Lower pole right renal lesion which is suboptimally evaluated, but most likely represents a complex cyst. Consider renal ultrasound follow-up at 1 year. 5. Left renal hemorrhagic/proteinaceous cyst. LFTs, Lipase, WBCs, electrolytes, renal fx, troponins all normal.   Hgb 10.7.  MCV 99  Seen by cardiology at [facility].  Echocardiogram 10/4 which demonstrated the severe aortic stenosis.  Preoperative evaluation includes that she is moderate to high risk for surgical intervention.  Aortic stenosis increases perioperative bleeding risk.  Dr. Diona Browner suggested transfer to [facility] for GI procedures specifically ERCP.   Takes baby aspirin daily but no blood thinners.  No PPI or H2 blockers.  Occasionally uses MiraLAX for constipation    Family history negative for gallbladder disease other than in some uncles.  No family history of colon cancer.  Does not drink alcohol.  Past Medical History:  Diagnosis Date  . Depression   . Heart murmur   . History of cellulitis   . History of endocarditis   . Hyperlipemia   . Neuropathy     Past Surgical History:  Procedure  Laterality Date  . Abdominal gangrene    . ADENOIDECTOMY    . Cataract surgery    . COLONOSCOPY    . TONSILLECTOMY    . Tummy tuck      Prior to Admission medications   Medication Sig Start Date End Date Taking? Authorizing Provider  Ascorbic Acid (VITAMIN C) 100 MG tablet Take 100 mg by mouth daily.   Yes [provider]  aspirin EC 81 MG tablet Take 81 mg by mouth daily.   Yes [provider]  Calcium Carbonate (CALCIUM-CARB 600 PO) Take 600 mg by mouth daily.   Yes [provider]  cholecalciferol (VITAMIN D) 1000 units tablet Take 1,000 Units by mouth daily.   Yes [provider]  ferrous  sulfate 325 (65 FE) MG tablet Take 325 mg by mouth daily with breakfast.   Yes [provider]  gabapentin (NEURONTIN) 300 MG capsule Take 300 mg by mouth 3 (three) times daily.   Yes [provider]  Multiple Vitamin (MULTIVITAMIN WITH MINERALS) TABS tablet Take 1 tablet by mouth daily.   Yes [provider]  nystatin ointment (MYCOSTATIN) Apply 1 application topically 2 (two) times daily as needed. 01/26/18  Yes [provider]  pravastatin (PRAVACHOL) 20 MG tablet Take 20 mg by mouth daily.   Yes [provider]  venlafaxine (EFFEXOR) 37.5 MG tablet Take 37.5 mg by mouth daily.   Yes [provider]    Scheduled Meds: . aspirin EC  81 mg Oral Daily  . calcium carbonate  1,250 mg Oral Daily  . cholecalciferol  1,000 Units Oral Daily  . ferrous sulfate  325 mg Oral Q breakfast  . gabapentin  300 mg Oral TID  . heparin injection (subcutaneous)  5,000 Units Subcutaneous Q8H  . mouth rinse  15 mL Mouth Rinse BID  . multivitamin with minerals  1 tablet Oral Daily  . pantoprazole  40 mg Oral BID  . pravastatin  20 mg Oral q1800  . sodium chloride flush  3 mL Intravenous Q12H  . venlafaxine  37.5 mg Oral Daily  . vitamin C  250 mg Oral Daily   Infusions: . sodium chloride    . sodium chloride    . cefTRIAXone (ROCEPHIN)  IV     PRN Meds: sodium chloride, sodium chloride, acetaminophen **OR** acetaminophen, hydrALAZINE, morphine injection, ondansetron (ZOFRAN) IV, sodium chloride flush   Allergies as of 02/06/2018  . (No Known Allergies)    Family History  Problem Relation Age of Onset  . Cancer Mother   . Hypertension Father   . Arthritis Father     Social History   Socioeconomic History  . Marital status: Married    Spouse name: Not on file  . Number of children: Not on file  . Years of education: Not on file  . Highest education level: Not on file  Occupational History  . Not on file  Social Needs  . Financial  resource strain: Not on file  . Food insecurity:    Worry: Not on file    Inability: Not on file  . Transportation needs:    Medical: Not on file    Non-medical: Not on file  Tobacco Use  . Smoking status: Former Smoker    Last attempt to quit: 05/23/1978    Years since quitting: 39.7  . Smokeless tobacco: Never Used  . Tobacco comment: only 1 pack pewr week   Substance and Sexual Activity  . Alcohol use: Yes  Comment: Occasional  . Drug use: Never  . Sexual activity: Not on file  Lifestyle  . Physical activity:    Days per week: Not on file    Minutes per session: Not on file  . Stress: Not on file  Relationships  . Social connections:    Talks on phone: Not on file    Gets together: Not on file    Attends religious service: Not on file    Active member of club or organization: Not on file    Attends meetings of clubs or organizations: Not on file    Relationship status: Not on file  . Intimate partner violence:    Fear of current or ex partner: Not on file    Emotionally abused: Not on file    Physically abused: Not on file    Forced sexual activity: Not on file  Other Topics Concern  . Not on file  Social History Narrative  . Not on file    REVIEW OF SYSTEMS: Constitutional: Patient is not very active.  She uses a walker to transfer from furniture to her wheelchair.  She is afraid of falling ENT:  No nose bleeds Pulm: No dyspnea at rest.  No cough.  Has never been tested for sleep apnea. CV:  No palpitations, no chest pain.  +  LE edema. GU:  No hematuria, no frequency, no amber or dark-colored urine. GI: Per HPI. Heme: Bruises somewhat easily but no unusual bleeding or excessive bruising. Transfusions: None. Neuro:  No headaches, no peripheral tingling or numbness Derm:  No itching, no rash or sores.  Endocrine:  No sweats or chills.  No polyuria or dysuria Immunization: Not queried Travel:  None beyond local counties in last few months.    PHYSICAL  EXAM: Vital signs in last 24 hours: Vitals:   02/07/18 0657 02/07/18 1335  BP: 114/63 139/72  Pulse: 79 77  Resp: 20 20  Temp: 98.4 F (36.9 C) 98.6 F (37 C)  SpO2: 97% 93%   Wt Readings from Last 3 Encounters:  02/07/18 122.3 kg  01/21/18 124.3 kg    General: Obese, pleasant, comfortable, chronically ill appearing and somewhat pale WF.  Good historian. Head: No facial asymmetry or swelling.  No signs of head trauma. Eyes: No scleral icterus or conjunctival pallor.  EOMI. Ears: Not hard of hearing Nose: No congestion or discharge Mouth: Oropharynx moist, clear, pink.  Tongue midline.  Poor dentition with caries and missing teeth Neck: No JVD, no masses, no thyromegaly. Lungs: Diminished breath sounds at the bases with a few fine crackles.  No labored breathing or cough. Heart: RRR.  S1, S2 present.  3/6 systolic, harsh murmur. Abdomen: Obese.  Not tender.  Not distended.  Large pannus.  Well-healed scar right upper quadrant consistent with cholecystostomy tube site.  No HSM, masses, bruits, hernias..   Rectal: Deferred Musc/Skeltl: No joint redness, swelling or significant contractures.  Arthritic changes in the fingers. Extremities: Chronic appearing edema bilaterally in the lower legs consistent with venous stasis.  1+ pedal pulses. Neurologic: Alert.  Oriented x3.  Fluid speech.  No gross weakness or deficits. Skin: Telangiectasia, rashes, sores.  Few purpura on the arms. Tattoos: None Nodes: No cervical adenopathy. Psych: Pleasant, cooperative, not anxious or depressed.  Intake/Output from previous day: 10/09 0701 - 10/10 0700 In: 100 [IV Piggyback:100] Out: -  Intake/Output this shift: Total I/O In: 240 [P.O.:240] Out: 1150 [Urine:1150]  LAB RESULTS: Recent Labs    02/06/18 1251 02/07/18  0513  WBC 9.2 7.7  HGB 11.7* 10.7*  HCT 38.5 34.5*  PLT 255 244   BMET Lab Results  Component Value Date   NA 141 02/07/2018   NA 139 02/06/2018   K 3.9 02/07/2018    K 3.8 02/06/2018   CL 105 02/07/2018   CL 105 02/06/2018   CO2 30 02/07/2018   CO2 26 02/06/2018   GLUCOSE 101 (H) 02/07/2018   GLUCOSE 108 (H) 02/06/2018   BUN 13 02/07/2018   BUN 18 02/06/2018   CREATININE 0.62 02/07/2018   CREATININE 0.60 02/06/2018   CALCIUM 8.8 (L) 02/07/2018   CALCIUM 9.2 02/06/2018   LFT Recent Labs    02/06/18 1251 02/07/18 0513  PROT 6.8 6.3*  ALBUMIN 3.6 3.3*  AST 17 15  ALT 12 11  ALKPHOS 69 62  BILITOT 0.6 0.6   PT/INR No results found for: INR, PROTIME Hepatitis Panel No results for input(s): HEPBSAG, HCVAB, HEPAIGM, HEPBIGM in the last 72 hours. C-Diff No components found for: CDIFF Lipase     Component Value Date/Time   LIPASE 23 02/06/2018 1251    Drugs of Abuse  No results found for: LABOPIA, COCAINSCRNUR, LABBENZ, AMPHETMU, THCU, LABBARB   RADIOLOGY STUDIES: Dg Chest 2 View  Result Date: 02/06/2018 CLINICAL DATA:  Three days of epigastric pain which worsened this morning. History of CHF, former smoker. EXAM: CHEST - 2 VIEW COMPARISON:  None. FINDINGS: The lungs are mildly hypoinflated. The lung markings are coarse in the retrocardiac region and are accentuated by hypoinflation. The cardiac silhouette is enlarged. The pulmonary vascularity is normal. There is calcification in the wall of the aortic arch. The bony thorax exhibits no acute abnormality. The bowel gas pattern in the upper abdomen is unremarkable. IMPRESSION: Cardiomegaly without pulmonary vascular congestion or pulmonary edema. Coarse lung markings in the retrocardiac region may reflect atelectasis or early pneumonia though I favor atelectasis. Thoracic aortic atherosclerosis. Electronically Signed   By: David  [place] M.D.   On: 02/06/2018 14:14   Mr 3d Recon At Scanner  Result Date: 02/06/2018 CLINICAL DATA:  Cholelithiasis. Epigastric pain for 3 days with nausea. History CHF. Prior "tummy tuck". EXAM: MRI ABDOMEN WITHOUT AND WITH CONTRAST (INCLUDING MRCP) TECHNIQUE:  Multiplanar multisequence MR imaging of the abdomen was performed both before and after the administration of intravenous contrast. Heavily T2-weighted images of the biliary and pancreatic ducts were obtained, and three-dimensional MRCP images were rendered by post processing. CONTRAST:  10 cc Gadavist COMPARISON:  Ultrasound earlier today. FINDINGS: Mild to moderate degradation throughout secondary to patient body habitus and motion. Lower chest: Mild cardiomegaly, without pericardial or pleural effusion. Hepatobiliary: High left hepatic lobe cyst. Gallstones including at 3.0 cm. Gallbladder wall thickening, including at 10 mm on image 27/5. Mild intrahepatic biliary duct dilatation. The common duct measures 1.6 cm in the porta hepatis. 7 mm focus of T2 hypointensity in the distal common duct on images 15/3. This is felt to be confirmed on motion degraded dedicated MRCP image 92/9. Pancreas:  Normal, without mass or ductal dilatation. Spleen:  Normal in size, without focal abnormality. Adrenals/Urinary Tract: Normal adrenal glands. An upper pole left renal 2.0 cm lesion medially is consistent with a hemorrhagic/proteinaceous cyst. A lower pole right renal lesion measures 1.3 cm on image 10/3 and is not well evaluated on post-contrast images, including image 64/5007. Stomach/Bowel: Normal stomach and abdominal bowel loops. Vascular/Lymphatic: Normal caliber of the aorta and branch vessels. No abdominal adenopathy. Other:  No ascites. Musculoskeletal: Convex left  lumbar spine curvature. IMPRESSION: 1. Mild to moderate motion degradation. 2. Cholelithiasis with gallbladder wall thickening, again suspicious for acute cholecystitis. 3. Biliary duct dilatation with a subtle 7 mm distal common duct stone. 4. Lower pole right renal lesion which is suboptimally evaluated, but most likely represents a complex cyst. Consider renal ultrasound follow-up at 1 year. 5. Left renal hemorrhagic/proteinaceous cyst. Electronically  Signed   By: Jeronimo Greaves M.D.   On: 02/06/2018 18:15   US Abdomen Limited  Result Date: 02/06/2018 CLINICAL DATA:  Acute right upper quadrant abdominal pain. EXAM: ULTRASOUND ABDOMEN LIMITED RIGHT UPPER QUADRANT COMPARISON:  None. FINDINGS: Gallbladder: Multiple gallstones are noted with the largest measuring 2.5 cm. Gallbladder wall thickening is noted at 7 mm. Positive sonographic Murphy's sign is noted. Sludge is noted within gallbladder lumen. Common bile duct: Diameter: 11 mm which is dilated and concerning for distal common bile duct obstruction. Liver: No focal lesion identified. Within normal limits in parenchymal echogenicity. Portal vein is patent on color Doppler imaging with normal direction of blood flow towards the liver. IMPRESSION: Cholelithiasis is noted with moderate gallbladder wall thickening and positive sonographic Murphy's sign concerning for acute cholecystitis. Common bile duct dilatation is noted concerning for distal common bile duct obstruction. Correlation with liver function tests and MRCP is recommended. Electronically Signed   By: Lupita Raider, M.D.   On: 02/06/2018 13:55   Mr Abdomen Mrcp Vivien Rossetti Contast  Result Date: 02/06/2018 CLINICAL DATA:  Cholelithiasis. Epigastric pain for 3 days with nausea. History CHF. Prior "tummy tuck". EXAM: MRI ABDOMEN WITHOUT AND WITH CONTRAST (INCLUDING MRCP) TECHNIQUE: Multiplanar multisequence MR imaging of the abdomen was performed both before and after the administration of intravenous contrast. Heavily T2-weighted images of the biliary and pancreatic ducts were obtained, and three-dimensional MRCP images were rendered by post processing. CONTRAST:  10 cc Gadavist COMPARISON:  Ultrasound earlier today. FINDINGS: Mild to moderate degradation throughout secondary to patient body habitus and motion. Lower chest: Mild cardiomegaly, without pericardial or pleural effusion. Hepatobiliary: High left hepatic lobe cyst. Gallstones including at 3.0  cm. Gallbladder wall thickening, including at 10 mm on image 27/5. Mild intrahepatic biliary duct dilatation. The common duct measures 1.6 cm in the porta hepatis. 7 mm focus of T2 hypointensity in the distal common duct on images 15/3. This is felt to be confirmed on motion degraded dedicated MRCP image 92/9. Pancreas:  Normal, without mass or ductal dilatation. Spleen:  Normal in size, without focal abnormality. Adrenals/Urinary Tract: Normal adrenal glands. An upper pole left renal 2.0 cm lesion medially is consistent with a hemorrhagic/proteinaceous cyst. A lower pole right renal lesion measures 1.3 cm on image 10/3 and is not well evaluated on post-contrast images, including image 64/5007. Stomach/Bowel: Normal stomach and abdominal bowel loops. Vascular/Lymphatic: Normal caliber of the aorta and branch vessels. No abdominal adenopathy. Other:  No ascites. Musculoskeletal: Convex left lumbar spine curvature. IMPRESSION: 1. Mild to moderate motion degradation. 2. Cholelithiasis with gallbladder wall thickening, again suspicious for acute cholecystitis. 3. Biliary duct dilatation with a subtle 7 mm distal common duct stone. 4. Lower pole right renal lesion which is suboptimally evaluated, but most likely represents a complex cyst. Consider renal ultrasound follow-up at 1 year. 5. Left renal hemorrhagic/proteinaceous cyst. Electronically Signed   By: Jeronimo Greaves M.D.   On: 02/06/2018 18:15     IMPRESSION:   *   Choledocholithiasis.  Interestingly her LFTs have been normal x 2.    *    Cholelithiasis.  Presume she had cholecystitis December 2018 at which time she underwent percutaneous cholecystostomy tube placement, removed after about 6 weeks.    *   Severe aortic stenosis.  Increased risk for sedation.  At rest she is well compensated.    PLAN:     *    Patient has scheduled ERCP for noon tomorrow with Dr. Meridee Score.   Discussed risks of sedation, bleeding, pancreatitis, inability to complete  procedure and possible need for IR to perform intervention.  She and her daughter understand and are willing to proceed.  *    LFTs, PT/INR in the morning  *   Continue Rocephin for the time being.   Jennye Moccasin  02/07/2018, 1:55 PM Phone [phone]  Attending Attestation:  I have independently reviewed the history, performed a physical examination and agree with above full consultation, assessment and plan as detailed by PA Gribbin. Her daughter was present at the time of my evaluation.  ERCP recommended for recurrent, symptomatic choledocholithiasis. LFTs are normal. Cardiology consultation recommended prior to ERCP given her severe aortic stenosis and to consider her candidacy for surgery. If appropriate, surgical consultation recommended following ERCP.    I consented the patient at the bedside today discussing the risks, benefits, and alternatives to endoscopic evaluation. In particular, we discussed the risks that include, but are not limited to, reaction to medication, cardiopulmonary compromise, bleeding requiring blood transfusion, aspiration resulting in pneumonia, perforation requiring surgery, pancreatitis that can result in prolonged hospitalization as well as multiorgan dysfunction, and even death. The patient acknowledges these risks and asks that we proceed. The procedure has been tentatively scheduled for tomorrow. NPO at midnight. Will obtain INR in preparation for the procedure.   Thank for this consultation and for allowing me to participate in this patient's care. Please do not hesitate to call with any questions or concerns. We will continue to follow with you.

## 2018-02-07 NOTE — Progress Notes (Addendum)
PROGRESS NOTE    Dawn Gonzales  WUJ:[id number] DOB: [birth year] DOA: 02/06/2018 PCP: Benita Stabile, MD    Brief Narrative:  77 year old female with a history of hyperlipidemia, obesity and severe aortic stenosis, recently moved to [place] area from [place].  Admitted to the hospital with abdominal pain and found to have acute calculus cholecystitis with choledocholithiasis.  Due to her severe aortic stenosis, cardiology is recommended transfer to [facility] for further evaluation.  GI and general surgery team to call and have been informed of patient transfer.   Assessment & Plan:   Principal Problem:   Epigastric pain Active Problems:   N&V (nausea and vomiting)   Calculus of gallbladder with biliary obstruction but without cholecystitis   Preoperative cardiovascular examination   Acute cholecystitis   Severe aortic stenosis   1. Acute calculus cholecystitis with choledocholithiasis.  Patient presents with abdominal pain which appears to be improving.  Transaminases and bilirubin are currently normal.  WBC count is also normal.  MRCP shows cholelithiasis and gallbladder wall thickening as well as biliary duct dilatation with a subtle 7 mm distal common bile duct stone.  She has been seen by general surgery.  Due to her severe aortic stenosis, recommendations are for transfer to [facility] for further evaluation.  I have discussed the case with GI service and she will be on the schedule for ERCP on 10/11.  I have also informed general surgery team at [facility] outpatient transfer.  She will need either cholecystectomy or cholecystostomy tube.  She has had cholecystostomy tube last year.  We will keep her on ceftriaxone for now. 2. Severe aortic stenosis.  Seen by cardiology who recommends transfer to [facility] for further evaluation.  Cardiology service will follow account. 3. Nausea and vomiting.  Related to #1.  Appears to be improving.  Tolerating full liquids.  Will  keep patient n.p.o. after midnight in anticipation of ERCP tomorrow.   DVT prophylaxis: Heparin Code Status: Full code Family Communication: Discussed with daughter at the bedside Disposition Plan: Transfer to [facility] for further evaluation  Severity of Illness: The appropriate patient status for this patient is INPATIENT. Inpatient status is judged to be reasonable and necessary in order to provide the required intensity of service to ensure the patient's safety. The patient's presenting symptoms, physical exam findings, and initial radiographic and laboratory data in the context of their chronic comorbidities is felt to place them at high risk for further clinical deterioration. Furthermore, it is not anticipated that the patient will be medically stable for discharge from the hospital within 2 midnights of admission. The following factors support the patient status of inpatient.    "           The patient's presenting symptoms include  abdominal pain, nausea and vomiting. "           The worrisome physical exam findings include  right upper quadrant tenderness "           The initial radiographic and laboratory data are worrisome because of  gallbladder wall thickening with cholelithiasis as well as dilated intrahepatic ducts and CBD stone "           The chronic co-morbidities include  severe aortic stenosis     * I certify that at the point of admission it is my clinical judgment that the patient will require inpatient hospital care spanning beyond 2 midnights from the point of admission due to high intensity of  service, high risk for further deterioration and high frequency of surveillance required.*   Consultants:   Cardiology  General surgery  GI  Procedures:     Antimicrobials:   Ceftriaxone 10/9 >   Subjective: Abdominal pain is better.  No nausea or vomiting.  Objective: Vitals:   02/06/18 1930 02/06/18 2026 02/07/18 0500 02/07/18 0657  BP: (!) 146/76  137/70  114/63  Pulse: 75 (!) 59  79  Resp:  18  20  Temp:  98.6 F (37 C)  98.4 F (36.9 C)  TempSrc:  Oral  Oral  SpO2: (!) 77% 95%  97%  Weight:   122.3 kg   Height:        Intake/Output Summary (Last 24 hours) at 02/07/2018 1308 Last data filed at 02/07/2018 0900 Gross per 24 hour  Intake 340 ml  Output 1150 ml  Net -810 ml   Filed Weights   02/06/18 1202 02/07/18 0500  Weight: 124.3 kg 122.3 kg    Examination:  General exam: Appears calm and comfortable  Respiratory system: Clear to auscultation. Respiratory effort normal. Cardiovascular system: S1 & S2 heard, RRR. No JVD, murmurs, rubs, gallops or clicks. trace pedal edema. Gastrointestinal system: Abdomen is nondistended, soft and nontender. No organomegaly or masses felt. Normal bowel sounds heard. Central nervous system: Alert and oriented. No focal neurological deficits. Extremities: Symmetric 5 x 5 power. Skin: No rashes, lesions or ulcers Psychiatry: Judgement and insight appear normal. Mood & affect appropriate.     Data Reviewed: I have personally reviewed following labs and imaging studies  CBC: Recent Labs  Lab 02/06/18 1251 02/07/18 0513  WBC 9.2 7.7  NEUTROABS 6.9  --   HGB 11.7* 10.7*  HCT 38.5 34.5*  MCV 98.0 99.1  PLT 255 244   Basic Metabolic Panel: Recent Labs  Lab 02/06/18 1251 02/07/18 0513  NA 139 141  K 3.8 3.9  CL 105 105  CO2 26 30  GLUCOSE 108* 101*  BUN 18 13  CREATININE 0.60 0.62  CALCIUM 9.2 8.8*   GFR: Estimated Creatinine Clearance: 78.6 mL/min (by C-G formula based on SCr of 0.62 mg/dL). Liver Function Tests: Recent Labs  Lab 02/06/18 1251 02/07/18 0513  AST 17 15  ALT 12 11  ALKPHOS 69 62  BILITOT 0.6 0.6  PROT 6.8 6.3*  ALBUMIN 3.6 3.3*   Recent Labs  Lab 02/06/18 1251  LIPASE 23   No results for input(s): AMMONIA in the last 168 hours. Coagulation Profile: No results for input(s): INR, PROTIME in the last 168 hours. Cardiac Enzymes: Recent  Labs  Lab 02/06/18 1251 02/06/18 1819 02/06/18 2302 02/07/18 0513  TROPONINI <0.03 <0.03 <0.03 <0.03   BNP (last 3 results) No results for input(s): PROBNP in the last 8760 hours. HbA1C: No results for input(s): HGBA1C in the last 72 hours. CBG: No results for input(s): GLUCAP in the last 168 hours. Lipid Profile: No results for input(s): CHOL, HDL, LDLCALC, TRIG, CHOLHDL, LDLDIRECT in the last 72 hours. Thyroid Function Tests: No results for input(s): TSH, T4TOTAL, FREET4, T3FREE, THYROIDAB in the last 72 hours. Anemia Panel: No results for input(s): VITAMINB12, FOLATE, FERRITIN, TIBC, IRON, RETICCTPCT in the last 72 hours. Sepsis Labs: No results for input(s): PROCALCITON, LATICACIDVEN in the last 168 hours.  No results found for this or any previous visit (from the past 240 hour(s)).       Radiology Studies: Dg Chest 2 View  Result Date: 02/06/2018 CLINICAL DATA:  Three days of epigastric pain  which worsened this morning. History of CHF, former smoker. EXAM: CHEST - 2 VIEW COMPARISON:  None. FINDINGS: The lungs are mildly hypoinflated. The lung markings are coarse in the retrocardiac region and are accentuated by hypoinflation. The cardiac silhouette is enlarged. The pulmonary vascularity is normal. There is calcification in the wall of the aortic arch. The bony thorax exhibits no acute abnormality. The bowel gas pattern in the upper abdomen is unremarkable. IMPRESSION: Cardiomegaly without pulmonary vascular congestion or pulmonary edema. Coarse lung markings in the retrocardiac region may reflect atelectasis or early pneumonia though I favor atelectasis. Thoracic aortic atherosclerosis. Electronically Signed   By: David  [place] M.D.   On: 02/06/2018 14:14   Mr 3d Recon At Scanner  Result Date: 02/06/2018 CLINICAL DATA:  Cholelithiasis. Epigastric pain for 3 days with nausea. History CHF. Prior "tummy tuck". EXAM: MRI ABDOMEN WITHOUT AND WITH CONTRAST (INCLUDING MRCP)  TECHNIQUE: Multiplanar multisequence MR imaging of the abdomen was performed both before and after the administration of intravenous contrast. Heavily T2-weighted images of the biliary and pancreatic ducts were obtained, and three-dimensional MRCP images were rendered by post processing. CONTRAST:  10 cc Gadavist COMPARISON:  Ultrasound earlier today. FINDINGS: Mild to moderate degradation throughout secondary to patient body habitus and motion. Lower chest: Mild cardiomegaly, without pericardial or pleural effusion. Hepatobiliary: High left hepatic lobe cyst. Gallstones including at 3.0 cm. Gallbladder wall thickening, including at 10 mm on image 27/5. Mild intrahepatic biliary duct dilatation. The common duct measures 1.6 cm in the porta hepatis. 7 mm focus of T2 hypointensity in the distal common duct on images 15/3. This is felt to be confirmed on motion degraded dedicated MRCP image 92/9. Pancreas:  Normal, without mass or ductal dilatation. Spleen:  Normal in size, without focal abnormality. Adrenals/Urinary Tract: Normal adrenal glands. An upper pole left renal 2.0 cm lesion medially is consistent with a hemorrhagic/proteinaceous cyst. A lower pole right renal lesion measures 1.3 cm on image 10/3 and is not well evaluated on post-contrast images, including image 64/5007. Stomach/Bowel: Normal stomach and abdominal bowel loops. Vascular/Lymphatic: Normal caliber of the aorta and branch vessels. No abdominal adenopathy. Other:  No ascites. Musculoskeletal: Convex left lumbar spine curvature. IMPRESSION: 1. Mild to moderate motion degradation. 2. Cholelithiasis with gallbladder wall thickening, again suspicious for acute cholecystitis. 3. Biliary duct dilatation with a subtle 7 mm distal common duct stone. 4. Lower pole right renal lesion which is suboptimally evaluated, but most likely represents a complex cyst. Consider renal ultrasound follow-up at 1 year. 5. Left renal hemorrhagic/proteinaceous cyst.  Electronically Signed   By: Jeronimo Greaves M.D.   On: 02/06/2018 18:15   US Abdomen Limited  Result Date: 02/06/2018 CLINICAL DATA:  Acute right upper quadrant abdominal pain. EXAM: ULTRASOUND ABDOMEN LIMITED RIGHT UPPER QUADRANT COMPARISON:  None. FINDINGS: Gallbladder: Multiple gallstones are noted with the largest measuring 2.5 cm. Gallbladder wall thickening is noted at 7 mm. Positive sonographic Murphy's sign is noted. Sludge is noted within gallbladder lumen. Common bile duct: Diameter: 11 mm which is dilated and concerning for distal common bile duct obstruction. Liver: No focal lesion identified. Within normal limits in parenchymal echogenicity. Portal vein is patent on color Doppler imaging with normal direction of blood flow towards the liver. IMPRESSION: Cholelithiasis is noted with moderate gallbladder wall thickening and positive sonographic Murphy's sign concerning for acute cholecystitis. Common bile duct dilatation is noted concerning for distal common bile duct obstruction. Correlation with liver function tests and MRCP is recommended. Electronically Signed  By: Lupita Raider, M.D.   On: 02/06/2018 13:55   Mr Abdomen Mrcp Vivien Rossetti Contast  Result Date: 02/06/2018 CLINICAL DATA:  Cholelithiasis. Epigastric pain for 3 days with nausea. History CHF. Prior "tummy tuck". EXAM: MRI ABDOMEN WITHOUT AND WITH CONTRAST (INCLUDING MRCP) TECHNIQUE: Multiplanar multisequence MR imaging of the abdomen was performed both before and after the administration of intravenous contrast. Heavily T2-weighted images of the biliary and pancreatic ducts were obtained, and three-dimensional MRCP images were rendered by post processing. CONTRAST:  10 cc Gadavist COMPARISON:  Ultrasound earlier today. FINDINGS: Mild to moderate degradation throughout secondary to patient body habitus and motion. Lower chest: Mild cardiomegaly, without pericardial or pleural effusion. Hepatobiliary: High left hepatic lobe cyst. Gallstones  including at 3.0 cm. Gallbladder wall thickening, including at 10 mm on image 27/5. Mild intrahepatic biliary duct dilatation. The common duct measures 1.6 cm in the porta hepatis. 7 mm focus of T2 hypointensity in the distal common duct on images 15/3. This is felt to be confirmed on motion degraded dedicated MRCP image 92/9. Pancreas:  Normal, without mass or ductal dilatation. Spleen:  Normal in size, without focal abnormality. Adrenals/Urinary Tract: Normal adrenal glands. An upper pole left renal 2.0 cm lesion medially is consistent with a hemorrhagic/proteinaceous cyst. A lower pole right renal lesion measures 1.3 cm on image 10/3 and is not well evaluated on post-contrast images, including image 64/5007. Stomach/Bowel: Normal stomach and abdominal bowel loops. Vascular/Lymphatic: Normal caliber of the aorta and branch vessels. No abdominal adenopathy. Other:  No ascites. Musculoskeletal: Convex left lumbar spine curvature. IMPRESSION: 1. Mild to moderate motion degradation. 2. Cholelithiasis with gallbladder wall thickening, again suspicious for acute cholecystitis. 3. Biliary duct dilatation with a subtle 7 mm distal common duct stone. 4. Lower pole right renal lesion which is suboptimally evaluated, but most likely represents a complex cyst. Consider renal ultrasound follow-up at 1 year. 5. Left renal hemorrhagic/proteinaceous cyst. Electronically Signed   By: Jeronimo Greaves M.D.   On: 02/06/2018 18:15        Scheduled Meds: . aspirin EC  81 mg Oral Daily  . calcium carbonate  1,250 mg Oral Daily  . cholecalciferol  1,000 Units Oral Daily  . enoxaparin (LOVENOX) injection  60 mg Subcutaneous Q24H  . ferrous sulfate  325 mg Oral Q breakfast  . gabapentin  300 mg Oral TID  . mouth rinse  15 mL Mouth Rinse BID  . multivitamin with minerals  1 tablet Oral Daily  . pantoprazole  40 mg Oral BID  . pravastatin  20 mg Oral q1800  . sodium chloride flush  3 mL Intravenous Q12H  . venlafaxine  37.5  mg Oral Daily  . vitamin C  250 mg Oral Daily   Continuous Infusions: . sodium chloride    . sodium chloride       LOS: 0 days    Time spent:    Erick Blinks, MD [facility] Pager [phone]  If 7PM-7AM, please contact night-coverage [email or web address] Password [email or web address] 02/07/2018, 1:08 PM

## 2018-02-07 NOTE — Plan of Care (Signed)

## 2018-02-07 NOTE — Progress Notes (Signed)
Report called to Elana, nurse on [address] at [place].  Information passed on to notify [facility] when patient arrives there.  Patient has denied nausea and pain today and has been on full liquid diet.  To be NPO after midnight for ERCP tomorrow.  Daughter at bedside.

## 2018-02-07 NOTE — Consult Note (Signed)
Reason for Consult: choledocholithiasis Referring Physician: Dr. Pearletha Forge  Dawn Gonzales is an 77 y.o. female.  HPI: 77 y/o presented to ED at [facility] on 01/08/18 with abdominal pain, DOE. She has a hx of cholecystostomy tube placement in [place] last year. Her sx at that time included recurrent nausea and vomiting.   She had cholecystostomy tube for 4-6 months, and it was removed.  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  She has a stroke 2018 and went back to rehab sometime in March and April 2018.  Some time after that they  were told she had some endocarditis.  They can't remember much more about that occurrence. She has been in and out of the hospital and Rehab multiple times over the last 2 years.     She  reports pain and nausea this time that started Monday 02/04/18.  Pain persisted and she presented to the ED yesterday at [facility].  She currently is pain free and tolerating liquids.  Work up shows a glucose of 108, but otherwise normal CMP.  Troponin  < 0.03 x 4, WBC 9.2, H/H is stable.  US shows gallstones, the largest 2.5 cm, Gallbladder wall thickening is noted at 7 mm. Positive sonographic Murphy's sign is noted. Sludge is noted within gallbladder lumen.  CBD 11 mm concerning for distal common bile duct obstruction.  MRI:   Cholelithiasis with gallbladder wall thickening, again suspicious for acute cholecystitis. Biliary duct dilatation with a subtle 7 mm distal common duct stone.  She has a hx of leg cellulitis, and endocarditis with prolong rehab stay in [place], [place].  She recently moved to [place], and is being transferred to [facility] because of her AS for further RX   Past Medical History:  Diagnosis Date  . Depression   . Heart murmur   . History of cellulitis   . History of endocarditis   . Hyperlipemia   . Neuropathy     Past Surgical History:  Procedure Laterality Date  .  Abdominal gangrene    . ADENOIDECTOMY    . Cataract surgery    . COLONOSCOPY    . TONSILLECTOMY    . Tummy tuck      Family History  Problem Relation Age of Onset  . Cancer Mother   . Hypertension Father   . Arthritis Father     Social History:  reports that she quit smoking about 39 years ago. She has never used smokeless tobacco. She reports that she drinks alcohol. She reports that she does not use drugs.   ETOH:  None Tobacco:  None Drugs:  None   Allergies: No Known Allergies  Prior to Admission medications   Medication Sig Start Date End Date Taking? Authorizing Provider  Ascorbic Acid (VITAMIN C) 100 MG tablet Take 100 mg by mouth daily.   Yes [provider]  aspirin EC 81 MG tablet Take 81 mg by mouth daily.   Yes [provider]  Calcium Carbonate (CALCIUM-CARB 600 PO) Take 600 mg by mouth daily.   Yes [provider]  cholecalciferol (VITAMIN D) 1000 units tablet Take 1,000 Units by mouth daily.   Yes [provider]  ferrous sulfate 325 (65 FE) MG tablet Take 325 mg by mouth daily with breakfast.   Yes [provider]  gabapentin (NEURONTIN) 300 MG capsule Take 300 mg by mouth 3 (three) times daily.  Yes [provider]  Multiple Vitamin (MULTIVITAMIN WITH MINERALS) TABS tablet Take 1 tablet by mouth daily.   Yes [provider]  nystatin ointment (MYCOSTATIN) Apply 1 application topically 2 (two) times daily as needed. 01/26/18  Yes [provider]  pravastatin (PRAVACHOL) 20 MG tablet Take 20 mg by mouth daily.   Yes [provider]  venlafaxine (EFFEXOR) 37.5 MG tablet Take 37.5 mg by mouth daily.   Yes [provider]     Results for orders placed or performed during the hospital encounter of 02/06/18 (from the past 48 hour(s))  Urinalysis, Routine w reflex microscopic     Status: Abnormal   Collection Time: 02/06/18 12:27 PM  Result Value Ref Range   Color, Urine STRAW  (A) YELLOW   APPearance CLEAR CLEAR   Specific Gravity, Urine 1.005 1.005 - 1.030   pH 7.0 5.0 - 8.0   Glucose, UA NEGATIVE NEGATIVE mg/dL   Hgb urine dipstick NEGATIVE NEGATIVE   Bilirubin Urine NEGATIVE NEGATIVE   Ketones, ur NEGATIVE NEGATIVE mg/dL   Protein, ur NEGATIVE NEGATIVE mg/dL   Nitrite NEGATIVE NEGATIVE   Leukocytes, UA NEGATIVE NEGATIVE    Comment: Performed at [facility], [address] [place], [place] [address]  CBC with Differential/Platelet     Status: Abnormal   Collection Time: 02/06/18 12:51 PM  Result Value Ref Range   WBC 9.2 4.0 - 10.5 K/uL   RBC 3.93 3.87 - 5.11 MIL/uL   Hemoglobin 11.7 (L) 12.0 - 15.0 g/dL   HCT 38.5 36.0 - 46.0 %   MCV 98.0 80.0 - 100.0 fL   MCH 29.8 26.0 - 34.0 pg   MCHC 30.4 30.0 - 36.0 g/dL   RDW 14.6 11.5 - 15.5 %   Platelets 255 150 - 400 K/uL   nRBC 0.0 0.0 - 0.2 %   Neutrophils Relative % 76 %   Neutro Abs 6.9 1.7 - 7.7 K/uL   Lymphocytes Relative 16 %   Lymphs Abs 1.5 0.7 - 4.0 K/uL   Monocytes Relative 6 %   Monocytes Absolute 0.6 0.1 - 1.0 K/uL   Eosinophils Relative 2 %   Eosinophils Absolute 0.2 0.0 - 0.5 K/uL   Basophils Relative 0 %   Basophils Absolute 0.0 0.0 - 0.1 K/uL   Immature Granulocytes 0 %   Abs Immature Granulocytes 0.02 0.00 - 0.07 K/uL    Comment: Performed at [facility], [address], [place], [place] [address]  Comprehensive metabolic panel     Status: Abnormal   Collection Time: 02/06/18 12:51 PM  Result Value Ref Range   Sodium 139 135 - 145 mmol/L   Potassium 3.8 3.5 - 5.1 mmol/L   Chloride 105 98 - 111 mmol/L   CO2 26 22 - 32 mmol/L   Glucose, Bld 108 (H) 70 - 99 mg/dL   BUN 18 8 - 23 mg/dL   Creatinine, Ser 0.60 0.44 - 1.00 mg/dL   Calcium 9.2 8.9 - 10.3 mg/dL   Total Protein 6.8 6.5 - 8.1 g/dL   Albumin 3.6 3.5 - 5.0 g/dL   AST 17 15 - 41 U/L   ALT 12 0 - 44 U/L   Alkaline Phosphatase 69 38 - 126 U/L   Total Bilirubin 0.6 0.3 - 1.2 mg/dL   GFR calc non Af Amer >60 >60 mL/min    GFR calc Af Amer >60 >60 mL/min    Comment: (NOTE) The eGFR has been calculated using the CKD EPI equation. This  calculation has not been validated in all clinical situations. eGFR's persistently <60 mL/min signify possible Chronic Kidney Disease.    Anion gap 8 5 - 15    Comment: Performed at [facility] [address] [place], [place] [address]  Troponin I     Status: None   Collection Time: 02/06/18 12:51 PM  Result Value Ref Range   Troponin I <0.03 <0.03 ng/mL    Comment: Performed at [facility], [address] [place], [place] [address]  Lipase, blood     Status: None   Collection Time: 02/06/18 12:51 PM  Result Value Ref Range   Lipase 23 11 - 51 U/L    Comment: Performed at [facility], [address] [place], [place] [address]  Troponin I (q 6hr x 3)     Status: None   Collection Time: 02/06/18  6:19 PM  Result Value Ref Range   Troponin I <0.03 <0.03 ng/mL    Comment: Performed at [facility], [address], [place], [place] [address]  Troponin I (q 6hr x 3)     Status: None   Collection Time: 02/06/18 11:02 PM  Result Value Ref Range   Troponin I <0.03 <0.03 ng/mL    Comment: Performed at [facility], [address], [place], [place] [address]  Troponin I (q 6hr x 3)     Status: None   Collection Time: 02/07/18  5:13 AM  Result Value Ref Range   Troponin I <0.03 <0.03 ng/mL    Comment: Performed at [facility], [address] [place], [place] [address]  Comprehensive metabolic panel     Status: Abnormal   Collection Time: 02/07/18  5:13 AM  Result Value Ref Range   Sodium 141 135 - 145 mmol/L   Potassium 3.9 3.5 - 5.1 mmol/L   Chloride 105 98 - 111 mmol/L   CO2 30 22 - 32 mmol/L   Glucose, Bld 101 (H) 70 - 99 mg/dL   BUN 13 8 - 23 mg/dL   Creatinine, Ser 0.62 0.44 - 1.00 mg/dL   Calcium 8.8 (L) 8.9 - 10.3 mg/dL   Total Protein 6.3 (L) 6.5 - 8.1 g/dL   Albumin 3.3 (L) 3.5 - 5.0 g/dL   AST 15 15 - 41 U/L   ALT 11 0 - 44 U/L   Alkaline Phosphatase 62 38  - 126 U/L   Total Bilirubin 0.6 0.3 - 1.2 mg/dL   GFR calc non Af Amer >60 >60 mL/min   GFR calc Af Amer >60 >60 mL/min    Comment: (NOTE) The eGFR has been calculated using the CKD EPI equation. This calculation has not been validated in all clinical situations. eGFR's persistently <60 mL/min signify possible Chronic Kidney Disease.    Anion gap 6 5 - 15    Comment: Performed at [facility], [address] [place], [place] [address]  CBC     Status: Abnormal   Collection Time: 02/07/18  5:13 AM  Result Value Ref Range   WBC 7.7 4.0 - 10.5 K/uL   RBC 3.48 (L) 3.87 - 5.11 MIL/uL   Hemoglobin 10.7 (L) 12.0 - 15.0 g/dL   HCT 34.5 (L) 36.0 - 46.0 %   MCV 99.1 80.0 - 100.0 fL   MCH 30.7 26.0 - 34.0 pg   MCHC 31.0 30.0 - 36.0 g/dL   RDW 14.8 11.5 - 15.5 %   Platelets 244 150 - 400 K/uL   nRBC 0.0 0.0 - 0.2 %  Comment: Performed at [facility], [address] [place], [place] [address]    Dg Chest 2 View  Result Date: 02/06/2018 CLINICAL DATA:  Three days of epigastric pain which worsened this morning. History of CHF, former smoker. EXAM: CHEST - 2 VIEW COMPARISON:  None. FINDINGS: The lungs are mildly hypoinflated. The lung markings are coarse in the retrocardiac region and are accentuated by hypoinflation. The cardiac silhouette is enlarged. The pulmonary vascularity is normal. There is calcification in the wall of the aortic arch. The bony thorax exhibits no acute abnormality. The bowel gas pattern in the upper abdomen is unremarkable. IMPRESSION: Cardiomegaly without pulmonary vascular congestion or pulmonary edema. Coarse lung markings in the retrocardiac region may reflect atelectasis or early pneumonia though I favor atelectasis. Thoracic aortic atherosclerosis. Electronically Signed   By: David  Martinique M.D.   On: 02/06/2018 14:14   Mr 3d Recon At Scanner  Result Date: 02/06/2018 CLINICAL DATA:  Cholelithiasis. Epigastric pain for 3 days with nausea. History CHF. Prior "tummy  tuck". EXAM: MRI ABDOMEN WITHOUT AND WITH CONTRAST (INCLUDING MRCP) TECHNIQUE: Multiplanar multisequence MR imaging of the abdomen was performed both before and after the administration of intravenous contrast. Heavily T2-weighted images of the biliary and pancreatic ducts were obtained, and three-dimensional MRCP images were rendered by post processing. CONTRAST:  10 cc Gadavist COMPARISON:  Ultrasound earlier today. FINDINGS: Mild to moderate degradation throughout secondary to patient body habitus and motion. Lower chest: Mild cardiomegaly, without pericardial or pleural effusion. Hepatobiliary: High left hepatic lobe cyst. Gallstones including at 3.0 cm. Gallbladder wall thickening, including at 10 mm on image 27/5. Mild intrahepatic biliary duct dilatation. The common duct measures 1.6 cm in the porta hepatis. 7 mm focus of T2 hypointensity in the distal common duct on images 15/3. This is felt to be confirmed on motion degraded dedicated MRCP image 92/9. Pancreas:  Normal, without mass or ductal dilatation. Spleen:  Normal in size, without focal abnormality. Adrenals/Urinary Tract: Normal adrenal glands. An upper pole left renal 2.0 cm lesion medially is consistent with a hemorrhagic/proteinaceous cyst. A lower pole right renal lesion measures 1.3 cm on image 10/3 and is not well evaluated on post-contrast images, including image 64/5007. Stomach/Bowel: Normal stomach and abdominal bowel loops. Vascular/Lymphatic: Normal caliber of the aorta and branch vessels. No abdominal adenopathy. Other:  No ascites. Musculoskeletal: Convex left lumbar spine curvature. IMPRESSION: 1. Mild to moderate motion degradation. 2. Cholelithiasis with gallbladder wall thickening, again suspicious for acute cholecystitis. 3. Biliary duct dilatation with a subtle 7 mm distal common duct stone. 4. Lower pole right renal lesion which is suboptimally evaluated, but most likely represents a complex cyst. Consider renal ultrasound  follow-up at 1 year. 5. Left renal hemorrhagic/proteinaceous cyst. Electronically Signed   By: Abigail Miyamoto M.D.   On: 02/06/2018 18:15   US Abdomen Limited  Result Date: 02/06/2018 CLINICAL DATA:  Acute right upper quadrant abdominal pain. EXAM: ULTRASOUND ABDOMEN LIMITED RIGHT UPPER QUADRANT COMPARISON:  None. FINDINGS: Gallbladder: Multiple gallstones are noted with the largest measuring 2.5 cm. Gallbladder wall thickening is noted at 7 mm. Positive sonographic Murphy's sign is noted. Sludge is noted within gallbladder lumen. Common bile duct: Diameter: 11 mm which is dilated and concerning for distal common bile duct obstruction. Liver: No focal lesion identified. Within normal limits in parenchymal echogenicity. Portal vein is patent on color Doppler imaging with normal direction of blood flow towards the liver. IMPRESSION: Cholelithiasis is noted with moderate gallbladder wall thickening and positive sonographic Murphy's  sign concerning for acute cholecystitis. Common bile duct dilatation is noted concerning for distal common bile duct obstruction. Correlation with liver function tests and MRCP is recommended. Electronically Signed   By: Marijo Conception, M.D.   On: 02/06/2018 13:55   Mr Abdomen Mrcp Moise Boring Contast  Result Date: 02/06/2018 CLINICAL DATA:  Cholelithiasis. Epigastric pain for 3 days with nausea. History CHF. Prior "tummy tuck". EXAM: MRI ABDOMEN WITHOUT AND WITH CONTRAST (INCLUDING MRCP) TECHNIQUE: Multiplanar multisequence MR imaging of the abdomen was performed both before and after the administration of intravenous contrast. Heavily T2-weighted images of the biliary and pancreatic ducts were obtained, and three-dimensional MRCP images were rendered by post processing. CONTRAST:  10 cc Gadavist COMPARISON:  Ultrasound earlier today. FINDINGS: Mild to moderate degradation throughout secondary to patient body habitus and motion. Lower chest: Mild cardiomegaly, without pericardial or pleural  effusion. Hepatobiliary: High left hepatic lobe cyst. Gallstones including at 3.0 cm. Gallbladder wall thickening, including at 10 mm on image 27/5. Mild intrahepatic biliary duct dilatation. The common duct measures 1.6 cm in the porta hepatis. 7 mm focus of T2 hypointensity in the distal common duct on images 15/3. This is felt to be confirmed on motion degraded dedicated MRCP image 92/9. Pancreas:  Normal, without mass or ductal dilatation. Spleen:  Normal in size, without focal abnormality. Adrenals/Urinary Tract: Normal adrenal glands. An upper pole left renal 2.0 cm lesion medially is consistent with a hemorrhagic/proteinaceous cyst. A lower pole right renal lesion measures 1.3 cm on image 10/3 and is not well evaluated on post-contrast images, including image 64/5007. Stomach/Bowel: Normal stomach and abdominal bowel loops. Vascular/Lymphatic: Normal caliber of the aorta and branch vessels. No abdominal adenopathy. Other:  No ascites. Musculoskeletal: Convex left lumbar spine curvature. IMPRESSION: 1. Mild to moderate motion degradation. 2. Cholelithiasis with gallbladder wall thickening, again suspicious for acute cholecystitis. 3. Biliary duct dilatation with a subtle 7 mm distal common duct stone. 4. Lower pole right renal lesion which is suboptimally evaluated, but most likely represents a complex cyst. Consider renal ultrasound follow-up at 1 year. 5. Left renal hemorrhagic/proteinaceous cyst. Electronically Signed   By: Abigail Miyamoto M.D.   On: 02/06/2018 18:15    Review of Systems  Constitutional: Negative.   HENT: Negative.   Eyes: Negative.   Cardiovascular: Negative.   Gastrointestinal: Positive for abdominal pain and nausea. Negative for blood in stool, constipation, diarrhea, heartburn, melena and vomiting.  Genitourinary: Negative.   Musculoskeletal: Negative.   Skin: Negative.   Neurological: Negative.   Endo/Heme/Allergies: Negative.   Psychiatric/Behavioral: Negative.    Blood  pressure 139/72, pulse 77, temperature 98.6 F (37 C), temperature source Oral, resp. rate 20, height '5\' 6"'  (1.676 m), weight 122.3 kg, SpO2 93 %. Physical Exam  Constitutional: She is oriented to person, place, and time. She appears well-developed and well-nourished. No distress.  HENT:  Head: Normocephalic and atraumatic.  Mouth/Throat: Oropharynx is clear and moist. No oropharyngeal exudate.  Eyes: Right eye exhibits no discharge. Left eye exhibits no discharge. No scleral icterus.  Pupils are equal  Neck: Normal range of motion. Neck supple. No JVD present. No tracheal deviation present. No thyromegaly present.  Cardiovascular: Normal rate, regular rhythm and intact distal pulses.  Murmur (large systolic honking aortic murmru.) heard. Respiratory: Effort normal and breath sounds normal. No respiratory distress. She has no wheezes. She has no rales.  GI: Soft. Bowel sounds are normal. She exhibits no distension and no mass. There is no tenderness. There is  no rebound and no guarding.  Prior panniculectomy. Soft non tender + BS.  Musculoskeletal: She exhibits no edema or tenderness.  Lymphadenopathy:    She has no cervical adenopathy.  Neurological: She is alert and oriented to person, place, and time. No cranial nerve deficit.  Skin: Skin is warm and dry. No rash noted. She is not diaphoretic. No erythema. No pallor.  Psychiatric: She has a normal mood and affect. Her behavior is normal. Judgment and thought content normal.   . sodium chloride    . sodium chloride    . cefTRIAXone (ROCEPHIN)  IV     Anti-infectives (From admission, onward)   Start     Dose/Rate Route Frequency Ordered Stop   02/07/18 1700  cefTRIAXone (ROCEPHIN) 2 g in sodium chloride 0.9 % 100 mL IVPB     2 g 200 mL/hr over 30 Minutes Intravenous Every 24 hours 02/07/18 1320     02/07/18 1315  cefTRIAXone (ROCEPHIN) 2 g in sodium chloride 0.9 % 100 mL IVPB  Status:  Discontinued     2 g 200 mL/hr over 30 Minutes  Intravenous Every 24 hours 02/07/18 1309 02/07/18 1320   02/06/18 1530  cefTRIAXone (ROCEPHIN) 2 g in sodium chloride 0.9 % 100 mL IVPB     2 g 200 mL/hr over 30 Minutes Intravenous  Once 02/06/18 1524 02/06/18 1919      Assessment/Plan: Hyperlipidemia Neuropathy BMI 44 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 Valve area (VTI): 0.67 cm^2. Valve area (Vmax): 0.76 cm^2. Valve area (Vmean): 0.67 cm^2.   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  EF 75-79%, Grade 1 diastolic dysfunction  FEN:  IV fluids/full liquids - NPO at MN ID: Rocephin 10/9 - day 2 DVT:  SCD Follow up:  TBD  Plan:  Will await Cardiology and GI input, they are currently planning ERCP tomorrow.  We will follow and make further recommendations after everyone has seen her and ERCP completed.     Dawn Gonzales 02/07/2018, 2:58 PM

## 2018-02-07 NOTE — Consult Note (Signed)
Cardiology Consultation:   Patient ID: Dawn Gonzales; [id number]; 09-17-1940   Admit date: 02/06/2018 Date of Consult: 02/07/2018  Primary Care Provider: Benita Stabile, MD Primary Cardiologist: Dr. Jonelle Sidle   Patient Profile:   Dawn Gonzales is a 77 y.o. female with a history of long-standing heart murmur and apparent aortic stenosis, SBE 2017 while living in [facility], right bundle branch block, hyperlipidemia, neuropathy, and depression who is being seen today for preoperative cardiac evaluation at the request of Dr. Lovell Sheehan.  History of Present Illness:   Dawn Gonzales was seen just recently in the office as a new patient for follow-up of long-standing heart murmur and suspected aortic stenosis, although no records were available regarding her prior work-up at that time.  She had moved from [facility] where she had undergone prior cardiac assessment, presumably discussion of TAVR at one point, also treatment for endocarditis.  She has since undergone an echocardiogram at this facility on October 4 demonstrating severe aortic stenosis with a mean gradient of 51 mmHg.  She is currently admitted to the hospital with recent feelings of nausea and abdominal cramping, no frank emesis but weakness and poor appetite.  Abdominal ultrasound demonstrates cholelithiasis with moderate gallbladder wall thickening and sonographic Murphy sign positive.  Common bile duct dilatation was also noted.  MRCP showed biliary duct dilatation with a 7 mm distal common duct stone.  In reviewing the surgical consultation note and discussing the case with Dr. Kerry Hough, plan is apparently to pursue ERCP and possibly cholecystectomy.  Based on provided history, apparently patient had a gallbladder trouble about a year ago when she was living in [facility] and at that time had a percutaneous drain placed, presumably due to concerns about surgical intervention.  Past Medical History:    Diagnosis Date  . Depression   . Heart murmur   . History of cellulitis   . History of endocarditis   . Hyperlipemia   . Neuropathy     Past Surgical History:  Procedure Laterality Date  . Abdominal gangrene    . ADENOIDECTOMY    . Cataract surgery    . COLONOSCOPY    . TONSILLECTOMY    . Tummy tuck       Inpatient Medications: Scheduled Meds: . aspirin EC  81 mg Oral Daily  . calcium carbonate  1,250 mg Oral Daily  . cholecalciferol  1,000 Units Oral Daily  . enoxaparin (LOVENOX) injection  60 mg Subcutaneous Q24H  . ferrous sulfate  325 mg Oral Q breakfast  . gabapentin  300 mg Oral TID  . Influenza vac split quadrivalent PF  0.5 mL Intramuscular Tomorrow-1000  . mouth rinse  15 mL Mouth Rinse BID  . multivitamin with minerals  1 tablet Oral Daily  . pantoprazole  40 mg Oral BID  . pravastatin  20 mg Oral q1800  . sodium chloride flush  3 mL Intravenous Q12H  . venlafaxine  37.5 mg Oral Daily  . vitamin C  250 mg Oral Daily   Continuous Infusions: . sodium chloride    . sodium chloride     PRN Meds: sodium chloride, sodium chloride, acetaminophen **OR** acetaminophen, hydrALAZINE, morphine injection, ondansetron (ZOFRAN) IV, sodium chloride flush  Allergies:   No Known Allergies  Social History:   Social History   Socioeconomic History  . Marital status: Married    Spouse name: Not on file  . Number of children: Not on file  . Years of education: Not on file  .  Highest education level: Not on file  Occupational History  . Not on file  Social Needs  . Financial resource strain: Not on file  . Food insecurity:    Worry: Not on file    Inability: Not on file  . Transportation needs:    Medical: Not on file    Non-medical: Not on file  Tobacco Use  . Smoking status: Former Smoker    Last attempt to quit: 05/23/1978    Years since quitting: 39.7  . Smokeless tobacco: Never Used  . Tobacco comment: only 1 pack pewr week   Substance and Sexual  Activity  . Alcohol use: Yes    Comment: Occasional  . Drug use: Never  . Sexual activity: Not on file  Lifestyle  . Physical activity:    Days per week: Not on file    Minutes per session: Not on file  . Stress: Not on file  Relationships  . Social connections:    Talks on phone: Not on file    Gets together: Not on file    Attends religious service: Not on file    Active member of club or organization: Not on file    Attends meetings of clubs or organizations: Not on file    Relationship status: Not on file  . Intimate partner violence:    Fear of current or ex partner: Not on file    Emotionally abused: Not on file    Physically abused: Not on file    Forced sexual activity: Not on file  Other Topics Concern  . Not on file  Social History Narrative  . Not on file    Family History:   The patient's family history includes Arthritis in her father; Cancer in her mother; Hypertension in her father.  ROS:  Please see the history of present illness.  Weakness and fatigue.  No chest pain or breathlessness at rest.  All other ROS reviewed and negative.     Physical Exam/Data:   Vitals:   02/06/18 1930 02/06/18 2026 02/07/18 0500 02/07/18 0657  BP: (!) 146/76 137/70  114/63  Pulse: 75 (!) 59  79  Resp:  18  20  Temp:  98.6 F (37 C)  98.4 F (36.9 C)  TempSrc:  Oral  Oral  SpO2: (!) 77% 95%  97%  Weight:   122.3 kg   Height:        Intake/Output Summary (Last 24 hours) at 02/07/2018 1015 Last data filed at 02/07/2018 0900 Gross per 24 hour  Intake 340 ml  Output 1150 ml  Net -810 ml   Filed Weights   02/06/18 1202 02/07/18 0500  Weight: 124.3 kg 122.3 kg   Body mass index is 43.52 kg/m.   Gen: Morbidly obese woman in no distress. HEENT: Conjunctiva and lids normal, oropharynx clear. Neck: Supple, no elevated JVP or carotid bruits, no thyromegaly. Lungs: Clear to auscultation, nonlabored breathing at rest. Cardiac: Regular rate and rhythm, no S3, 3/6  systolic murmur, no pericardial rub. Abdomen: Morbidly obese with pannus, mildly tender to palpation without guarding. Extremities: Chronic appearing bilateral leg edema and venous stasis, distal pulses 1+. Skin: Warm and dry. Musculoskeletal: No kyphosis. Neuropsychiatric: Alert and oriented x3, affect grossly appropriate.  EKG:  I personally reviewed the tracing from 02/06/2018 which showed sinus rhythm with old right bundle branch block.  Telemetry:  I personally reviewed telemetry which shows sinus rhythm.  Relevant CV Studies:  Echocardiogram 02/01/2018: Study Conclusions  - Left ventricle:  The cavity size was normal. Wall thickness was   increased in a pattern of severe LVH. Systolic function was   normal. The estimated ejection fraction was in the range of 55%   to 60%. Wall motion was normal; there were no regional wall   motion abnormalities. Doppler parameters are consistent with   abnormal left ventricular relaxation (grade 1 diastolic   dysfunction). Doppler parameters are consistent with high   ventricular filling pressure. - Aortic valve: Severely calcified annulus. Trileaflet; severely   thickened leaflets. There was severe stenosis. Mean gradient (S):   51 mm Hg. VTI ratio of LVOT to aortic valve: 0.18. Valve area   (VTI): 0.67 cm^2. Valve area (Vmax): 0.76 cm^2. Valve area   (Vmean): 0.67 cm^2. - Mitral valve: Severely calcified annulus. The findings are   consistent with mild stenosis. Mean gradient (D): 3 mm Hg. Valve   area by pressure half-time: 2.18 cm^2. Valve area by continuity   equation (using LVOT flow): 1.8 cm^2. - Left atrium: The atrium was moderately dilated. - Atrial septum: There was increased thickness of the septum,   consistent with lipomatous hypertrophy. No defect or patent   foramen ovale was identified.  Laboratory Data:  Chemistry Recent Labs  Lab 02/06/18 1251 02/07/18 0513  NA 139 141  K 3.8 3.9  CL 105 105  CO2 26 30  GLUCOSE  108* 101*  BUN 18 13  CREATININE 0.60 0.62  CALCIUM 9.2 8.8*  GFRNONAA >60 >60  GFRAA >60 >60  ANIONGAP 8 6    Recent Labs  Lab 02/06/18 1251 02/07/18 0513  PROT 6.8 6.3*  ALBUMIN 3.6 3.3*  AST 17 15  ALT 12 11  ALKPHOS 69 62  BILITOT 0.6 0.6   Hematology Recent Labs  Lab 02/06/18 1251 02/07/18 0513  WBC 9.2 7.7  RBC 3.93 3.48*  HGB 11.7* 10.7*  HCT 38.5 34.5*  MCV 98.0 99.1  MCH 29.8 30.7  MCHC 30.4 31.0  RDW 14.6 14.8  PLT 255 244   Cardiac Enzymes Recent Labs  Lab 02/06/18 1251 02/06/18 1819 02/06/18 2302 02/07/18 0513  TROPONINI <0.03 <0.03 <0.03 <0.03   No results for input(s): TROPIPOC in the last 168 hours.   Radiology/Studies:  Dg Chest 2 View  Result Date: 02/06/2018 CLINICAL DATA:  Three days of epigastric pain which worsened this morning. History of CHF, former smoker. EXAM: CHEST - 2 VIEW COMPARISON:  None. FINDINGS: The lungs are mildly hypoinflated. The lung markings are coarse in the retrocardiac region and are accentuated by hypoinflation. The cardiac silhouette is enlarged. The pulmonary vascularity is normal. There is calcification in the wall of the aortic arch. The bony thorax exhibits no acute abnormality. The bowel gas pattern in the upper abdomen is unremarkable. IMPRESSION: Cardiomegaly without pulmonary vascular congestion or pulmonary edema. Coarse lung markings in the retrocardiac region may reflect atelectasis or early pneumonia though I favor atelectasis. Thoracic aortic atherosclerosis. Electronically Signed   By: David  [place] M.D.   On: 02/06/2018 14:14   Mr 3d Recon At Scanner  Result Date: 02/06/2018 CLINICAL DATA:  Cholelithiasis. Epigastric pain for 3 days with nausea. History CHF. Prior "tummy tuck". EXAM: MRI ABDOMEN WITHOUT AND WITH CONTRAST (INCLUDING MRCP) TECHNIQUE: Multiplanar multisequence MR imaging of the abdomen was performed both before and after the administration of intravenous contrast. Heavily T2-weighted images of  the biliary and pancreatic ducts were obtained, and three-dimensional MRCP images were rendered by post processing. CONTRAST:  10 cc Gadavist COMPARISON:  Ultrasound  earlier today. FINDINGS: Mild to moderate degradation throughout secondary to patient body habitus and motion. Lower chest: Mild cardiomegaly, without pericardial or pleural effusion. Hepatobiliary: High left hepatic lobe cyst. Gallstones including at 3.0 cm. Gallbladder wall thickening, including at 10 mm on image 27/5. Mild intrahepatic biliary duct dilatation. The common duct measures 1.6 cm in the porta hepatis. 7 mm focus of T2 hypointensity in the distal common duct on images 15/3. This is felt to be confirmed on motion degraded dedicated MRCP image 92/9. Pancreas:  Normal, without mass or ductal dilatation. Spleen:  Normal in size, without focal abnormality. Adrenals/Urinary Tract: Normal adrenal glands. An upper pole left renal 2.0 cm lesion medially is consistent with a hemorrhagic/proteinaceous cyst. A lower pole right renal lesion measures 1.3 cm on image 10/3 and is not well evaluated on post-contrast images, including image 64/5007. Stomach/Bowel: Normal stomach and abdominal bowel loops. Vascular/Lymphatic: Normal caliber of the aorta and branch vessels. No abdominal adenopathy. Other:  No ascites. Musculoskeletal: Convex left lumbar spine curvature. IMPRESSION: 1. Mild to moderate motion degradation. 2. Cholelithiasis with gallbladder wall thickening, again suspicious for acute cholecystitis. 3. Biliary duct dilatation with a subtle 7 mm distal common duct stone. 4. Lower pole right renal lesion which is suboptimally evaluated, but most likely represents a complex cyst. Consider renal ultrasound follow-up at 1 year. 5. Left renal hemorrhagic/proteinaceous cyst. Electronically Signed   By: Jeronimo Greaves M.D.   On: 02/06/2018 18:15   US Abdomen Limited  Result Date: 02/06/2018 CLINICAL DATA:  Acute right upper quadrant abdominal pain.  EXAM: ULTRASOUND ABDOMEN LIMITED RIGHT UPPER QUADRANT COMPARISON:  None. FINDINGS: Gallbladder: Multiple gallstones are noted with the largest measuring 2.5 cm. Gallbladder wall thickening is noted at 7 mm. Positive sonographic Murphy's sign is noted. Sludge is noted within gallbladder lumen. Common bile duct: Diameter: 11 mm which is dilated and concerning for distal common bile duct obstruction. Liver: No focal lesion identified. Within normal limits in parenchymal echogenicity. Portal vein is patent on color Doppler imaging with normal direction of blood flow towards the liver. IMPRESSION: Cholelithiasis is noted with moderate gallbladder wall thickening and positive sonographic Murphy's sign concerning for acute cholecystitis. Common bile duct dilatation is noted concerning for distal common bile duct obstruction. Correlation with liver function tests and MRCP is recommended. Electronically Signed   By: Lupita Raider, M.D.   On: 02/06/2018 13:55   Mr Abdomen Mrcp Vivien Rossetti Contast  Result Date: 02/06/2018 CLINICAL DATA:  Cholelithiasis. Epigastric pain for 3 days with nausea. History CHF. Prior "tummy tuck". EXAM: MRI ABDOMEN WITHOUT AND WITH CONTRAST (INCLUDING MRCP) TECHNIQUE: Multiplanar multisequence MR imaging of the abdomen was performed both before and after the administration of intravenous contrast. Heavily T2-weighted images of the biliary and pancreatic ducts were obtained, and three-dimensional MRCP images were rendered by post processing. CONTRAST:  10 cc Gadavist COMPARISON:  Ultrasound earlier today. FINDINGS: Mild to moderate degradation throughout secondary to patient body habitus and motion. Lower chest: Mild cardiomegaly, without pericardial or pleural effusion. Hepatobiliary: High left hepatic lobe cyst. Gallstones including at 3.0 cm. Gallbladder wall thickening, including at 10 mm on image 27/5. Mild intrahepatic biliary duct dilatation. The common duct measures 1.6 cm in the porta  hepatis. 7 mm focus of T2 hypointensity in the distal common duct on images 15/3. This is felt to be confirmed on motion degraded dedicated MRCP image 92/9. Pancreas:  Normal, without mass or ductal dilatation. Spleen:  Normal in size, without focal abnormality. Adrenals/Urinary Tract:  Normal adrenal glands. An upper pole left renal 2.0 cm lesion medially is consistent with a hemorrhagic/proteinaceous cyst. A lower pole right renal lesion measures 1.3 cm on image 10/3 and is not well evaluated on post-contrast images, including image 64/5007. Stomach/Bowel: Normal stomach and abdominal bowel loops. Vascular/Lymphatic: Normal caliber of the aorta and branch vessels. No abdominal adenopathy. Other:  No ascites. Musculoskeletal: Convex left lumbar spine curvature. IMPRESSION: 1. Mild to moderate motion degradation. 2. Cholelithiasis with gallbladder wall thickening, again suspicious for acute cholecystitis. 3. Biliary duct dilatation with a subtle 7 mm distal common duct stone. 4. Lower pole right renal lesion which is suboptimally evaluated, but most likely represents a complex cyst. Consider renal ultrasound follow-up at 1 year. 5. Left renal hemorrhagic/proteinaceous cyst. Electronically Signed   By: Jeronimo Greaves M.D.   On: 02/06/2018 18:15    Assessment and Plan:   1.  Preoperative cardiac evaluation in a 77 year old woman with morbid obesity, severe aortic stenosis with mean gradient of 51 mmHg by recent echocardiogram (LVEF normal at 55 to 60%), hyperlipidemia on Pravachol as an outpatient, remote tobacco use, and limited functional capacity.  She is now admitted with acute cholecystitis and cholelithiasis with biliary duct dilatation and a 7 mm distal common duct stone.  Being considered for ERCP and possible cholecystectomy.  RCRI cardiac risk calculator indicates class II 0.9% risk of major cardiac event, but does not include the patient's severe aortic stenosis and limited functional capacity in this  assessment.  She would be more in a moderate to high risk category for surgical intervention based on these issues.  Severe aortic stenosis also increases perioperative bleeding risk.  2.  Severe aortic stenosis with mean gradient 51 mmHg by recent echocardiogram.  Patient has a long-standing heart murmur.  She underwent previous evaluation while while living in [facility], also reportedly endocarditis and perhaps discussion of TAVR ultimately.  3.  Mixed hyperlipidemia, on Pravachol as an outpatient.  4.  Morbid obesity.  I discussed the case with Dr. Kerry Hough.  In light of patient's medical complexity and increased surgical risk, I would suggest that she be transferred to [facility] for further evaluation.  Risk is not so high as to preclude undergoing ERCP.  Whether or not a percutaneous drain versus open cholecystectomy needs to be pursued can be further contemplated.  This is not a situation where she would be considered for TAVR simply to "clear" her for undergoing gallbladder surgery.  She would be at increased risk for infection, and although severe aortic stenosis does increase perioperative surgical risk, it is typically not a situation where surgery would never be considered.  Careful attention needs to be paid to hemodynamics, would also keep her on telemetry to watch her rhythm.  Our cardiology service can follow her in consultation as well.  Signed, Nona Dell, MD  02/07/2018 10:15 AM

## 2018-02-08 ENCOUNTER — Other Ambulatory Visit: Payer: Self-pay

## 2018-02-08 ENCOUNTER — Inpatient Hospital Stay (HOSPITAL_COMMUNITY): Payer: Medicare PPO | Admitting: Certified Registered Nurse Anesthetist

## 2018-02-08 ENCOUNTER — Encounter (HOSPITAL_COMMUNITY)
Admission: EM | Disposition: A | Discharge status: Skilled Nursing Facility | Payer: Self-pay | Source: Home / Self Care | Attending: Internal Medicine

## 2018-02-08 ENCOUNTER — Encounter (HOSPITAL_COMMUNITY): Payer: Self-pay | Admitting: *Deleted

## 2018-02-08 ENCOUNTER — Inpatient Hospital Stay (HOSPITAL_COMMUNITY): Payer: Medicare PPO

## 2018-02-08 DIAGNOSIS — K839 Disease of biliary tract, unspecified: Secondary | ICD-10-CM

## 2018-02-08 DIAGNOSIS — K8020 Calculus of gallbladder without cholecystitis without obstruction: Secondary | ICD-10-CM

## 2018-02-08 DIAGNOSIS — K838 Other specified diseases of biliary tract: Secondary | ICD-10-CM

## 2018-02-08 DIAGNOSIS — K3189 Other diseases of stomach and duodenum: Secondary | ICD-10-CM

## 2018-02-08 HISTORY — PX: SPHINCTEROTOMY: SHX5544

## 2018-02-08 HISTORY — PX: REMOVAL OF STONES: SHX5545

## 2018-02-08 HISTORY — PX: ENDOSCOPIC RETROGRADE CHOLANGIOPANCREATOGRAPHY (ERCP) WITH PROPOFOL: SHX5810

## 2018-02-08 HISTORY — PX: EUS: SHX5427

## 2018-02-08 LAB — COMPREHENSIVE METABOLIC PANEL
ALT: 11 U/L (ref 0–44)
AST: 18 U/L (ref 15–41)
Albumin: 3 g/dL — ABNORMAL LOW (ref 3.5–5.0)
Alkaline Phosphatase: 63 U/L (ref 38–126)
Anion gap: 7 (ref 5–15)
BUN: 8 mg/dL (ref 8–23)
CO2: 31 mmol/L (ref 22–32)
Calcium: 9 mg/dL (ref 8.9–10.3)
Chloride: 102 mmol/L (ref 98–111)
Creatinine, Ser: 0.68 mg/dL (ref 0.44–1.00)
GFR calc Af Amer: >60 mL/min (ref >60)
GFR calc non Af Amer: >60 mL/min (ref >60)
Glucose, Bld: 103 mg/dL — ABNORMAL HIGH (ref 70–99)
Potassium: 3.9 mmol/L (ref 3.5–5.1)
Sodium: 140 mmol/L (ref 135–145)
Total Bilirubin: 0.5 mg/dL (ref 0.3–1.2)
Total Protein: 5.9 g/dL — ABNORMAL LOW (ref 6.5–8.1)

## 2018-02-08 LAB — GLUCOSE, CAPILLARY: Glucose-Capillary: 92 mg/dL (ref 70–99)

## 2018-02-08 SURGERY — ENDOSCOPIC RETROGRADE CHOLANGIOPANCREATOGRAPHY (ERCP) WITH PROPOFOL
Anesthesia: General

## 2018-02-08 MED ORDER — MORPHINE SULFATE (PF) 2 MG/ML IV SOLN
2.0000 mg | INTRAVENOUS | Status: DC | PRN
  Administered 2018-02-08: 2 mg via INTRAVENOUS

## 2018-02-08 MED ORDER — INDOMETHACIN 50 MG RE SUPP
100.0000 mg | Freq: Once | RECTAL | Status: DC
  Filled 2018-02-08: qty 2

## 2018-02-08 MED ORDER — ROCURONIUM BROMIDE 10 MG/ML (PF) SYRINGE
PREFILLED_SYRINGE | INTRAVENOUS | Status: DC | PRN
  Administered 2018-02-08 (×2): 10 mg via INTRAVENOUS
  Administered 2018-02-08: 50 mg via INTRAVENOUS
  Administered 2018-02-08: 20 mg via INTRAVENOUS

## 2018-02-08 MED ORDER — PROPOFOL 10 MG/ML IV BOLUS
INTRAVENOUS | Status: DC | PRN
  Administered 2018-02-08: 70 mg via INTRAVENOUS

## 2018-02-08 MED ORDER — IOPAMIDOL (ISOVUE-300) INJECTION 61%
INTRAVENOUS | Status: AC
  Filled 2018-02-08: qty 50

## 2018-02-08 MED ORDER — PIPERACILLIN-TAZOBACTAM 3.375 G IVPB
3.3750 g | Freq: Three times a day (TID) | INTRAVENOUS | Status: DC
  Administered 2018-02-08 – 2018-02-09 (×2): 3.375 g via INTRAVENOUS
  Filled 2018-02-08 (×4): qty 50

## 2018-02-08 MED ORDER — INDOMETHACIN 50 MG RE SUPP
RECTAL | Status: DC | PRN
  Administered 2018-02-08: 100 mg via RECTAL

## 2018-02-08 MED ORDER — GLUCAGON HCL RDNA (DIAGNOSTIC) 1 MG IJ SOLR
INTRAMUSCULAR | Status: DC | PRN
  Administered 2018-02-08 (×2): 0.25 mg via INTRAVENOUS

## 2018-02-08 MED ORDER — DEXTROSE-NACL 5-0.9 % IV SOLN
INTRAVENOUS | Status: DC
  Administered 2018-02-08 – 2018-02-09 (×2): via INTRAVENOUS

## 2018-02-08 MED ORDER — INDOMETHACIN 50 MG RE SUPP
RECTAL | Status: AC
  Filled 2018-02-08: qty 2

## 2018-02-08 MED ORDER — LACTATED RINGERS IV SOLN
INTRAVENOUS | Status: DC | PRN
  Administered 2018-02-08: 13:00:00 via INTRAVENOUS

## 2018-02-08 MED ORDER — SUGAMMADEX SODIUM 200 MG/2ML IV SOLN
INTRAVENOUS | Status: DC | PRN
  Administered 2018-02-08: 250 mg via INTRAVENOUS

## 2018-02-08 MED ORDER — ONDANSETRON HCL 4 MG/2ML IJ SOLN
INTRAMUSCULAR | Status: DC | PRN
  Administered 2018-02-08: 4 mg via INTRAVENOUS

## 2018-02-08 MED ORDER — GLUCAGON HCL RDNA (DIAGNOSTIC) 1 MG IJ SOLR
INTRAMUSCULAR | Status: AC
  Filled 2018-02-08: qty 1

## 2018-02-08 MED ORDER — LIDOCAINE HCL (CARDIAC) PF 100 MG/5ML IV SOSY
PREFILLED_SYRINGE | INTRAVENOUS | Status: DC | PRN
  Administered 2018-02-08: 80 mg via INTRAVENOUS

## 2018-02-08 MED ORDER — SODIUM CHLORIDE 0.9 % IV SOLN
INTRAVENOUS | Status: DC | PRN
  Administered 2018-02-08: 10 ug/min via INTRAVENOUS

## 2018-02-08 MED ORDER — PHENYLEPHRINE 40 MCG/ML (10ML) SYRINGE FOR IV PUSH (FOR BLOOD PRESSURE SUPPORT)
PREFILLED_SYRINGE | INTRAVENOUS | Status: DC | PRN
  Administered 2018-02-08 (×2): 80 ug via INTRAVENOUS

## 2018-02-08 MED ORDER — FENTANYL CITRATE (PF) 100 MCG/2ML IJ SOLN
INTRAMUSCULAR | Status: DC | PRN
  Administered 2018-02-08 (×2): 50 ug via INTRAVENOUS

## 2018-02-08 MED ORDER — SODIUM CHLORIDE 0.9 % IV SOLN
INTRAVENOUS | Status: DC | PRN
  Administered 2018-02-08: 30 mL

## 2018-02-08 MED ORDER — HEPARIN SODIUM (PORCINE) 5000 UNIT/ML IJ SOLN
5000.0000 [IU] | Freq: Three times a day (TID) | INTRAMUSCULAR | Status: DC
  Administered 2018-02-09 – 2018-02-13 (×12): 5000 [IU] via SUBCUTANEOUS
  Filled 2018-02-08 (×12): qty 1

## 2018-02-08 MED ORDER — HYDROMORPHONE HCL 1 MG/ML IJ SOLN
1.0000 mg | INTRAMUSCULAR | Status: DC | PRN
  Administered 2018-02-08: 1 mg via INTRAVENOUS
  Filled 2018-02-08: qty 1

## 2018-02-08 NOTE — Anesthesia Procedure Notes (Signed)
Arterial Line Insertion Start/End10/03/2018 1:15 PM, 02/08/2018 1:35 PM Performed by: Rogelia Boga, CRNA, CRNA  Patient location: Pre-op. Preanesthetic checklist: patient identified, IV checked, risks and benefits discussed, pre-op evaluation and timeout performed radial was placed Catheter size: 20 G Hand hygiene performed  and maximum sterile barriers used   Attempts: 2 Procedure performed without using ultrasound guided technique. Following insertion, dressing applied and Biopatch. Post procedure assessment: normal and unchanged  Patient tolerated the procedure well with no immediate complications.

## 2018-02-08 NOTE — Anesthesia Postprocedure Evaluation (Signed)
Anesthesia Post Note  Patient: Dawn Gonzales  Procedure(s) Performed: ENDOSCOPIC RETROGRADE CHOLANGIOPANCREATOGRAPHY (ERCP) WITH PROPOFOL (N/A ) UPPER ENDOSCOPIC ULTRASOUND (EUS) LINEAR SPHINCTEROTOMY REMOVAL OF STONES     Patient location during evaluation: PACU Anesthesia Type: General Level of consciousness: awake and alert Pain management: pain level controlled Vital Signs Assessment: post-procedure vital signs reviewed and stable Respiratory status: spontaneous breathing, nonlabored ventilation, respiratory function stable and patient connected to nasal cannula oxygen Cardiovascular status: blood pressure returned to baseline and stable Postop Assessment: no apparent nausea or vomiting Anesthetic complications: no    Last Vitals:  Vitals:   02/08/18 1536 02/08/18 1551  BP: 117/68 119/70  Pulse: 91 88  Resp: (!) 29 16  Temp:    SpO2: 90% 99%    Last Pain:  Vitals:   02/08/18 1522  TempSrc:   PainSc: 0-No pain                 Beryle Lathe

## 2018-02-08 NOTE — Interval H&P Note (Signed)
History and Physical Interval Note:  02/08/2018 12:16 PM  Dawn Gonzales  has presented today for surgery, with the diagnosis of Choledocholithiasis  The various methods of treatment have been discussed with the patient and family. After consideration of risks, benefits and other options for treatment, the patient has consented to  Procedure(s): ENDOSCOPIC RETROGRADE CHOLANGIOPANCREATOGRAPHY (ERCP) WITH PROPOFOL (N/A) as a surgical intervention .  The patient's history has been reviewed, patient examined, no change in status, stable for surgery.  I have reviewed the patient's chart and labs.  Questions were answered to the patient's satisfaction.   I think it is reasonable to consider a possible EUS due to the findings of normal LFTs and a CBD that is only mildly dilated. If we can definitively see a stone then will proceed with ERCP or if we cannot definitively feel that a stone is not present we will proceed with ERCP.   The risks of EUS including bleeding, infection, aspiration pneumonia and intestinal perforation were discussed as was the possibility it may not give a definitive diagnosis.  If a biopsy of the pancreas is done as part of the EUS, there is an additional risk of pancreatitis at the rate of about 1%.  It was explained that procedure related pancreatitis is typically mild, although can be severe and even life threatening, which is why we do not perform random pancreatic biopsies and only biopsy a lesion we feel is concerning enough to warrant the risk.   The risks of an ERCP were discussed at length, including but not limited to the risk of perforation, bleeding, abdominal pain, post-ERCP pancreatitis (while usually mild can be severe and even life threatening).    [employer]

## 2018-02-08 NOTE — Progress Notes (Signed)
RT placed pt on CPAP dream station on auto titrate 10/5 2Lpm bled in to system. Pt is wearing a medium full face mask at this time. Pt states she is comfortable and no further mask adjustments were needed. RT will continue to monitor.

## 2018-02-08 NOTE — Progress Notes (Signed)
Subjective/Chief Complaint: Complains of abdominal pain in ruq not controlled   Objective: Vital signs in last 24 hours: Temp:  [98.2 F (36.8 C)-98.7 F (37.1 C)] 98.6 F (37 C) (10/11 0454) Pulse Rate:  [73-86] 77 (10/11 0730) Resp:  [16-20] 18 (10/11 0454) BP: (128-152)/(67-84) 152/80 (10/11 0730) SpO2:  [92 %-96 %] 96 % (10/11 0454) Weight:  [124.3 kg-125.3 kg] 124.3 kg (10/11 0500) Last BM Date: 02/06/18  Intake/Output from previous day: 10/10 0701 - 10/11 0700 In: 707 [P.O.:354; I.V.:253; IV Piggyback:100] Out: 1800 [Urine:1800] Intake/Output this shift: No intake/output data recorded.  GI: tender ruq and epigastrium, obese, bs present  Lab Results:  Recent Labs    02/06/18 1251 02/07/18 0513  WBC 9.2 7.7  HGB 11.7* 10.7*  HCT 38.5 34.5*  PLT 255 244   BMET Recent Labs    02/07/18 0513 02/08/18 0222  NA 141 140  K 3.9 3.9  CL 105 102  CO2 30 31  GLUCOSE 101* 103*  BUN 13 8  CREATININE 0.62 0.68  CALCIUM 8.8* 9.0   PT/INR Recent Labs    02/07/18 1705  LABPROT 13.8  INR 1.07   ABG No results for input(s): PHART, HCO3 in the last 72 hours.  Invalid input(s): PCO2, PO2  Studies/Results: Dg Chest 2 View  Result Date: 02/06/2018 CLINICAL DATA:  Three days of epigastric pain which worsened this morning. History of CHF, former smoker. EXAM: CHEST - 2 VIEW COMPARISON:  None. FINDINGS: The lungs are mildly hypoinflated. The lung markings are coarse in the retrocardiac region and are accentuated by hypoinflation. The cardiac silhouette is enlarged. The pulmonary vascularity is normal. There is calcification in the wall of the aortic arch. The bony thorax exhibits no acute abnormality. The bowel gas pattern in the upper abdomen is unremarkable. IMPRESSION: Cardiomegaly without pulmonary vascular congestion or pulmonary edema. Coarse lung markings in the retrocardiac region may reflect atelectasis or early pneumonia though I favor atelectasis. Thoracic  aortic atherosclerosis. Electronically Signed   By: David  [place] M.D.   On: 02/06/2018 14:14   Mr 3d Recon At Scanner  Result Date: 02/06/2018 CLINICAL DATA:  Cholelithiasis. Epigastric pain for 3 days with nausea. History CHF. Prior "tummy tuck". EXAM: MRI ABDOMEN WITHOUT AND WITH CONTRAST (INCLUDING MRCP) TECHNIQUE: Multiplanar multisequence MR imaging of the abdomen was performed both before and after the administration of intravenous contrast. Heavily T2-weighted images of the biliary and pancreatic ducts were obtained, and three-dimensional MRCP images were rendered by post processing. CONTRAST:  10 cc Gadavist COMPARISON:  Ultrasound earlier today. FINDINGS: Mild to moderate degradation throughout secondary to patient body habitus and motion. Lower chest: Mild cardiomegaly, without pericardial or pleural effusion. Hepatobiliary: High left hepatic lobe cyst. Gallstones including at 3.0 cm. Gallbladder wall thickening, including at 10 mm on image 27/5. Mild intrahepatic biliary duct dilatation. The common duct measures 1.6 cm in the porta hepatis. 7 mm focus of T2 hypointensity in the distal common duct on images 15/3. This is felt to be confirmed on motion degraded dedicated MRCP image 92/9. Pancreas:  Normal, without mass or ductal dilatation. Spleen:  Normal in size, without focal abnormality. Adrenals/Urinary Tract: Normal adrenal glands. An upper pole left renal 2.0 cm lesion medially is consistent with a hemorrhagic/proteinaceous cyst. A lower pole right renal lesion measures 1.3 cm on image 10/3 and is not well evaluated on post-contrast images, including image 64/5007. Stomach/Bowel: Normal stomach and abdominal bowel loops. Vascular/Lymphatic: Normal caliber of the aorta and branch vessels. No  abdominal adenopathy. Other:  No ascites. Musculoskeletal: Convex left lumbar spine curvature. IMPRESSION: 1. Mild to moderate motion degradation. 2. Cholelithiasis with gallbladder wall thickening, again  suspicious for acute cholecystitis. 3. Biliary duct dilatation with a subtle 7 mm distal common duct stone. 4. Lower pole right renal lesion which is suboptimally evaluated, but most likely represents a complex cyst. Consider renal ultrasound follow-up at 1 year. 5. Left renal hemorrhagic/proteinaceous cyst. Electronically Signed   By: Jeronimo Greaves M.D.   On: 02/06/2018 18:15   US Abdomen Limited  Result Date: 02/06/2018 CLINICAL DATA:  Acute right upper quadrant abdominal pain. EXAM: ULTRASOUND ABDOMEN LIMITED RIGHT UPPER QUADRANT COMPARISON:  None. FINDINGS: Gallbladder: Multiple gallstones are noted with the largest measuring 2.5 cm. Gallbladder wall thickening is noted at 7 mm. Positive sonographic Murphy's sign is noted. Sludge is noted within gallbladder lumen. Common bile duct: Diameter: 11 mm which is dilated and concerning for distal common bile duct obstruction. Liver: No focal lesion identified. Within normal limits in parenchymal echogenicity. Portal vein is patent on color Doppler imaging with normal direction of blood flow towards the liver. IMPRESSION: Cholelithiasis is noted with moderate gallbladder wall thickening and positive sonographic Murphy's sign concerning for acute cholecystitis. Common bile duct dilatation is noted concerning for distal common bile duct obstruction. Correlation with liver function tests and MRCP is recommended. Electronically Signed   By: Lupita Raider, M.D.   On: 02/06/2018 13:55   Mr Abdomen Mrcp Vivien Rossetti Contast  Result Date: 02/06/2018 CLINICAL DATA:  Cholelithiasis. Epigastric pain for 3 days with nausea. History CHF. Prior "tummy tuck". EXAM: MRI ABDOMEN WITHOUT AND WITH CONTRAST (INCLUDING MRCP) TECHNIQUE: Multiplanar multisequence MR imaging of the abdomen was performed both before and after the administration of intravenous contrast. Heavily T2-weighted images of the biliary and pancreatic ducts were obtained, and three-dimensional MRCP images were rendered  by post processing. CONTRAST:  10 cc Gadavist COMPARISON:  Ultrasound earlier today. FINDINGS: Mild to moderate degradation throughout secondary to patient body habitus and motion. Lower chest: Mild cardiomegaly, without pericardial or pleural effusion. Hepatobiliary: High left hepatic lobe cyst. Gallstones including at 3.0 cm. Gallbladder wall thickening, including at 10 mm on image 27/5. Mild intrahepatic biliary duct dilatation. The common duct measures 1.6 cm in the porta hepatis. 7 mm focus of T2 hypointensity in the distal common duct on images 15/3. This is felt to be confirmed on motion degraded dedicated MRCP image 92/9. Pancreas:  Normal, without mass or ductal dilatation. Spleen:  Normal in size, without focal abnormality. Adrenals/Urinary Tract: Normal adrenal glands. An upper pole left renal 2.0 cm lesion medially is consistent with a hemorrhagic/proteinaceous cyst. A lower pole right renal lesion measures 1.3 cm on image 10/3 and is not well evaluated on post-contrast images, including image 64/5007. Stomach/Bowel: Normal stomach and abdominal bowel loops. Vascular/Lymphatic: Normal caliber of the aorta and branch vessels. No abdominal adenopathy. Other:  No ascites. Musculoskeletal: Convex left lumbar spine curvature. IMPRESSION: 1. Mild to moderate motion degradation. 2. Cholelithiasis with gallbladder wall thickening, again suspicious for acute cholecystitis. 3. Biliary duct dilatation with a subtle 7 mm distal common duct stone. 4. Lower pole right renal lesion which is suboptimally evaluated, but most likely represents a complex cyst. Consider renal ultrasound follow-up at 1 year. 5. Left renal hemorrhagic/proteinaceous cyst. Electronically Signed   By: Jeronimo Greaves M.D.   On: 02/06/2018 18:15    Anti-infectives: Anti-infectives (From admission, onward)   Start     Dose/Rate Route Frequency Ordered  Stop   02/07/18 1700  cefTRIAXone (ROCEPHIN) 2 g in sodium chloride 0.9 % 100 mL IVPB     2  g 200 mL/hr over 30 Minutes Intravenous Every 24 hours 02/07/18 1320     02/07/18 1315  cefTRIAXone (ROCEPHIN) 2 g in sodium chloride 0.9 % 100 mL IVPB  Status:  Discontinued     2 g 200 mL/hr over 30 Minutes Intravenous Every 24 hours 02/07/18 1309 02/07/18 1320   02/06/18 1530  cefTRIAXone (ROCEPHIN) 2 g in sodium chloride 0.9 % 100 mL IVPB     2 g 200 mL/hr over 30 Minutes Intravenous  Once 02/06/18 1524 02/06/18 1919      Assessment/Plan:  Possible choledocholithiasis and cholecystitis Severe AS  Npo/abx Await ercp and cards evaluation Ideal treatment for gb is going to be surgery.  She had perc chole and has recurrent symptoms/disease at this point. She is however fairly high risk.  If she would be better waiting for surgery 6-8 weeks if there was any way to decrease risk could consider perc chole again as temporizing measure.    Dawn Gonzales 02/08/2018

## 2018-02-08 NOTE — Transfer of Care (Signed)
Immediate Anesthesia Transfer of Care Note  Patient: Dawn Gonzales  Procedure(s) Performed: ENDOSCOPIC RETROGRADE CHOLANGIOPANCREATOGRAPHY (ERCP) WITH PROPOFOL (N/A ) UPPER ENDOSCOPIC ULTRASOUND (EUS) LINEAR SPHINCTEROTOMY REMOVAL OF STONES  Patient Location: PACU  Anesthesia Type:General  Level of Consciousness: drowsy  Airway & Oxygen Therapy: Patient Spontanous Breathing and Patient connected to face mask oxygen  Post-op Assessment: Report given to RN, Post -op Vital signs reviewed and stable and Patient moving all extremities  Post vital signs: Reviewed and stable  Last Vitals:  Vitals Value Taken Time  BP 137/86 02/08/2018  3:21 PM  Temp 37.2 C 02/08/2018  3:22 PM  Pulse 88 02/08/2018  3:24 PM  Resp 26 02/08/2018  3:24 PM  SpO2 99 % 02/08/2018  3:24 PM  Vitals shown include unvalidated device data.  Last Pain:  Vitals:   02/08/18 1522  TempSrc:   PainSc: 0-No pain      Patients Stated Pain Goal: 3 (02/08/18 1152)  Complications: No apparent anesthesia complications

## 2018-02-08 NOTE — Progress Notes (Addendum)
PROGRESS NOTE    Dawn Gonzales  UEA:[id number] DOB: 07/03/1940 DOA: 02/06/2018 PCP: Benita Stabile, MD    Brief Narrative:  77 year old female who presented with abdominal pain.  She does have significant past medical history for severe aortic stenosis, depression dyslipidemia.  Patient reported severe epigastric abdominal pain associated with nausea.  The initial physical examination blood pressure 147/87, heart rate 66, temperature 98.2, respiratory rate 18, oxygen saturation 91%.  Moist mucous membranes, lungs clear to auscultation bilaterally, heart S1-S2 present and rhythmic, bowel sounds were positive, abdomen soft nontender, negative Murphy sign, no lower extremity edema.  Sodium 139, potassium 3.8, chloride 105, bicarb 26, glucose 108, BUN 18, creatinine 0.6, white count 9.2, hemoglobin 11.7, hematocrit 38.5, platelets 255, urinalysis negative for infection, chest x-ray with no infiltrates, right hemidiaphragm elevation, abdominal ultrasound with cholelithiasis, moderate gallbladder wall thickening and common bile duct dilatation.  EKG sinus rhythm, left axis deviation, left anterior fascicular block, right branch block.   Patient was admitted to the hospital with the working diagnosis of abdominal pain due to cholecystitis complicated by common bile duct obstruction.   Assessment & Plan:   Principal Problem:   Epigastric pain Active Problems:   N&V (nausea and vomiting)   Calculus of gallbladder with biliary obstruction but without cholecystitis   Preoperative cardiovascular examination   Acute cholecystitis   Severe aortic stenosis   Choledocholithiasis   Cholecystitis, acute with cholelithiasis   1. Acute cholecystitis complicated with choledocholithiasis. Patient will have ERCP today. Patient with persistent pain despite morphine IV, will change to hydromorphone IV. Continue nothing by mouth and will add IV fluids with saline and dextrose. Antibiotic therapy with IV  ceftriaxone. Patient had percutaneous cholecystostomy tube in the past and ideal therapy will be surgery, per surgery recommendations.    2. Severe aortic stenosis. Echocardiogram with preserved LV systolic function, EF 55 to 60%. Severe aortic valve stenosis. Patient has been asymptomatic, with no angina, syncope or heart failure symptoms. 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   Her revised cardiac risk index is 2, consistent with high risk, unable to assess METs, patient uses wheelchair, but clearly limited functional physical activity. Considering risk vs benefit (recurrent disease), likely patient will benefit from cholecystectomy with a very close perioperative and post operative hemodynamic monitoring. Will follow on cardiology recommendations.   3. Sinus bradycardia with sinus pauses. Longest pause of 2 seconds, while sleeping, patient likely has sleep apnea, will continue oxygen supplementation and will do a trial of cpap at night. Will need formal sleep study as outpatient. 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   4. Dyslipidemia. Continue pravastatin per home regimen.   5. Morbid obesity. Calculated BMI 43, will outpatient follow up.   DVT prophylaxis: heparin   Code Status:  full Family Communication: no family at the bedside  Disposition Plan/ discharge barriers: pending ERCP and surgical evaluation.    Consultants:   GI   Surgery   Cardiology   Procedures:     Antimicrobials:       Subjective: Patient continue to have abdominal pain, right lower quadrant, colicky in nature, moderate to severe in intensity, no radiation, no worsening or improving factors, no associated nausea or vomiting, it has been refractive to IV analgesics. Had bradycardic episodes while  sleeping.  Objective: Vitals:   02/07/18 2046 02/08/18 0454 02/08/18 0500 02/08/18 0730  BP: 134/67 138/76  (!) 152/80  Pulse: 86 73  77  Resp: 18 18    Temp: 98.2 F (36.8 C) 98.6 F (37 C)    TempSrc: Oral Oral    SpO2: 92% 96%    Weight:   124.3 kg   Height:        Intake/Output Summary (Last 24 hours) at 02/08/2018 0900 Last data filed at 02/08/2018 0650 Gross per 24 hour  Intake 467 ml  Output 650 ml  Net -183 ml   Filed Weights   02/07/18 0500 02/07/18 1513 02/08/18 0500  Weight: 122.3 kg 125.3 kg 124.3 kg    Examination:   General: deconditioned and in pain.  Neurology: Awake and alert, non focal  E ENT: mild pallor, no icterus, oral mucosa moist Cardiovascular: No JVD. S1-S2 present, rhythmic, no gallops, or rubs. 3/6 systolic murmur at the right upper sternal border, radiated to the neck. . No lower extremity edema. Pulmonary: vesicular breath sounds bilaterally, adequate air movement, no wheezing, rhonchi or rales. Gastrointestinal. Abdomen protuberant and distended, no organomegaly, tender to palpation, positive guarding at the right lower quadrant with no rebound.  Skin. No rashes Musculoskeletal: no joint deformities     Data Reviewed: I have personally reviewed following labs and imaging studies  CBC: Recent Labs  Lab 02/06/18 1251 02/07/18 0513  WBC 9.2 7.7  NEUTROABS 6.9  --   HGB 11.7* 10.7*  HCT 38.5 34.5*  MCV 98.0 99.1  PLT 255 244   Basic Metabolic Panel: Recent Labs  Lab 02/06/18 1251 02/07/18 0513 02/08/18 0222  NA 139 141 140  K 3.8 3.9 3.9  CL 105 105 102  CO2 26 30 31   GLUCOSE 108* 101* 103*  BUN 18 13 8   CREATININE 0.60 0.62 0.68  CALCIUM 9.2 8.8* 9.0   GFR: Estimated Creatinine Clearance: 79.3 mL/min (by C-G formula based on SCr of 0.68 mg/dL). Liver Function Tests: Recent Labs  Lab 02/06/18 1251 02/07/18 0513 02/08/18 0222  AST 17 15 18   ALT 12 11 11   ALKPHOS 69 62 63  BILITOT 0.6 0.6 0.5  PROT 6.8 6.3*  5.9*  ALBUMIN 3.6 3.3* 3.0*   Recent Labs  Lab 02/06/18 1251  LIPASE 23   No results for input(s): AMMONIA in the last 168 hours. Coagulation Profile: Recent Labs  Lab 02/07/18 1705  INR 1.07   Cardiac Enzymes: Recent Labs  Lab 02/06/18 1251 02/06/18 1819 02/06/18 2302 02/07/18 0513  TROPONINI <0.03 <0.03 <0.03 <0.03   BNP (last 3 results) No results for input(s): PROBNP in the last 8760 hours. HbA1C: No results for input(s): HGBA1C in the last 72 hours. CBG: No results for input(s): GLUCAP in the last 168 hours. Lipid Profile: No results for input(s): CHOL, HDL, LDLCALC, TRIG, CHOLHDL, LDLDIRECT in the last 72 hours. Thyroid Function Tests: No results for input(s): TSH, T4TOTAL, FREET4, T3FREE, THYROIDAB in the last 72 hours. Anemia Panel: No results for input(s): VITAMINB12, FOLATE, FERRITIN, TIBC, IRON, RETICCTPCT in the last 72 hours.    Radiology Studies: I have reviewed all of the imaging during this hospital visit personally     Scheduled Meds: . aspirin EC  81 mg Oral Daily  . calcium carbonate  1,250 mg Oral Daily  . cholecalciferol  1,000 Units Oral Daily  . gabapentin  300 mg Oral TID  . heparin injection (subcutaneous)  5,000 Units Subcutaneous Q8H  . mouth rinse  15 mL Mouth Rinse BID  . multivitamin with minerals  1 tablet Oral Daily  . pantoprazole  40 mg Oral Daily  . pravastatin  20 mg Oral q1800  . sodium chloride flush  3 mL Intravenous Q12H  . venlafaxine  37.5 mg Oral Daily  . vitamin C  250 mg Oral Daily   Continuous Infusions: . sodium chloride 250 mL (02/07/18 1730)  . sodium chloride    . cefTRIAXone (ROCEPHIN)  IV 2 g (02/07/18 1842)     LOS: 1 day        Angels Shellhammer Annett Gula, MD [facility] Pager [phone]

## 2018-02-08 NOTE — Anesthesia Preprocedure Evaluation (Addendum)
Anesthesia Evaluation  Patient identified by MRN, date of birth, ID band Patient awake    Reviewed: Allergy & Precautions, NPO status , Patient's Chart, lab work & pertinent test results  History of Anesthesia Complications Negative for: history of anesthetic complications  Airway Mallampati: III  TM Distance: >3 FB Neck ROM: Full    Dental  (+) Dental Advisory Given, Poor Dentition, Missing   Pulmonary former smoker,    breath sounds clear to auscultation       Cardiovascular + Valvular Problems/Murmurs AS  Rhythm:Regular Rate:Normal + Systolic murmurs  Hx endocarditis  '19 TTE - Severe LVH. EF 55% to 60%. Grade 1 diastolic   Dysfunction. Severe AS. Mean gradient (S): 51 mm Hg. Mild mitral stenosis.  Moderately dilated LA. There was increased thickness of the atrial septum, consistent with lipomatous hypertrophy.    Neuro/Psych PSYCHIATRIC DISORDERS Depression negative neurological ROS     GI/Hepatic negative GI ROS, Neg liver ROS,   Endo/Other  Morbid obesity  Renal/GU negative Renal ROS  negative genitourinary   Musculoskeletal negative musculoskeletal ROS (+)   Abdominal (+) + obese,   Peds  Hematology  (+) anemia ,   Anesthesia Other Findings   Reproductive/Obstetrics                            Anesthesia Physical Anesthesia Plan  ASA: IV  Anesthesia Plan: General   Post-op Pain Management:    Induction: Intravenous  PONV Risk Score and Plan: 3 and Treatment may vary due to age or medical condition and Ondansetron  Airway Management Planned: Oral ETT  Additional Equipment: Arterial line  Intra-op Plan:   Post-operative Plan: Extubation in OR  Informed Consent: I have reviewed the patients History and Physical, chart, labs and discussed the procedure including the risks, benefits and alternatives for the proposed anesthesia with the patient or authorized representative  who has indicated his/her understanding and acceptance.   Dental advisory given  Plan Discussed with: CRNA and Anesthesiologist  Anesthesia Plan Comments:        Anesthesia Quick Evaluation

## 2018-02-08 NOTE — Op Note (Signed)
[facility] Patient Name: Dawn Gonzales Procedure Date : 02/08/2018 MRN: [id number] Attending MD: Justice Britain , MD Date of Birth: 12-29-1940 CSN: [id number] Age: 77 Admit Type: Inpatient Procedure:                ERCP Indications:              Bile duct stone(s), Biliary dilation on magnetic                            resonance cholangiopancreatography, Bile duct stone                            on magnetic resonance cholangiopancreatography Providers:                Justice Britain, MD, Cleda Daub, RN, Cherylynn Ridges, Technician Referring MD:             Thornton Park MD, MD, Dr. Cathlean Sauer ([facility]),                            Rolm Bookbinder Medicines:                General Anesthesia, Indomethacin 100 mg PR,                            Antibiotics for patient were already administered                            earlier in day Complications:            No immediate complications. Estimated Blood Loss:     Estimated blood loss was minimal. Procedure:                Pre-Anesthesia Assessment:                           - Prior to the procedure, a History and Physical                            was performed, and patient medications and                            allergies were reviewed. The patient's tolerance of                            previous anesthesia was also reviewed. The risks                            and benefits of the procedure and the sedation                            options and risks were discussed with the patient.                            All questions were  answered, and informed consent                            was obtained. Prior Anticoagulants: The patient has                            taken heparin. ASA Grade Assessment: IV - A patient                            with severe systemic disease that is a constant                            threat to life. After reviewing the risks and                benefits, the patient was deemed in satisfactory                            condition to undergo the procedure.                           After obtaining informed consent, the scope was                            passed under direct vision. Throughout the                            procedure, the patient's blood pressure, pulse, and                            oxygen saturations were monitored continuously. The                            [id number]-Q180V ([id number]) Olympus Duodensocope was                            introduced through the mouth, and used to inject                            contrast into and used to inject contrast into the                            bile duct. The ERCP was accomplished without                            difficulty. The patient tolerated the procedure.                            Fluoroscopic images on Canopy. Scope In: Scope Out: Findings:      The scout film was normal.      The esophagus was successfully intubated under direct vision without       detailed examination of the pharynx, larynx, and associated structures,       and upper GI tract. The major papilla was bulging and had a gaping  orifice.      A long 0.035 inch Soft Jagwire was passed into the biliary tree. The       short-nosed traction sphincterotome was passed over the guidewire and       the bile duct was then deeply cannulated. Contrast was injected. I       personally interpreted the bile duct images. Ductal flow of contrast was       adequate. Image quality was adequate. Contrast extended to the hepatic       ducts. Opacification of the entire biliary tree except for the cystic       duct and gallbladder was successful. The middle third of the main bile       duct, upper third of the main bile duct, left intrahepatic branches and       left and right hepatic ducts and all intrahepatic branches were severely       dilated. The largest diameter was 18 mm. The lower third of the  main       duct contained filling defects thought to be stone/debris. A 7 mm       biliary sphincterotomy was made with a monofilament short nose       sphincterotome using ERBE electrocautery. There was self limited oozing       from the sphincterotomy which did not require treatment. The biliary       tree was swept with a retrieval balloon starting at the bifurcation.       Sludge was swept from the duct. Two stones were removed. No stones       remained. An occlusion cholangiogram was performed that showed no       further significant biliary pathology but the cystic duct did not       opacify.      A Pancreatogram was not performed.      The duodenoscope was withdrawn from the patient. Impression:               - The major papilla appeared to be bulging and have                            a gaping orifice.                           - A filling defect consistent with a stone was seen                            on the cholangiogram.                           - The left and right hepatic ducts and all                            intrahepatic branches, upper third of the main bile                            duct, middle third of the main bile duct and left                            intrahepatic branches were severely dilated.                           -  Choledocholithiasis was found. Complete removal                            was accomplished by biliary sphincterotomy and                            balloon sweep.                           - The cystic duct did not opacify under occlusion                            cholangiogram. Recommendation:           - The patient will be observed post-procedure,                            until all discharge criteria are met.                           - Return patient to hospital ward for ongoing care.                           - Observe patient's clinical course.                           - Watch for pancreatitis, bleeding, perforation,                             and cholangitis.                           - Transition antibiotics to Zosyn from Ceftriaxone                            for broader coverage in setting of presumed                            concomittant cholecystitis.                           - Check liver enzymes (AST, ALT, alkaline                            phosphatase, bilirubin) in the morning.                           - Next steps in regards to therapeutic intervention                            in regards to timing of cholecystectomy vs                            percutaneous cholecystostomy drainage to be guided                            by our  surgical colleagues and patient's clinical                            course overall.                           - The findings and recommendations were discussed                            with the patient.                           - The findings and recommendations were discussed                            with the patient's family.                           - The findings and recommendations were discussed                            with the referring physician. Procedure Code(s):        --- Professional ---                           [phone], Endoscopic retrograde                            cholangiopancreatography (ERCP); with removal of                            calculi/debris from biliary/pancreatic duct(s)                           43262, Endoscopic retrograde                            cholangiopancreatography (ERCP); with                            sphincterotomy/papillotomy Diagnosis Code(s):        --- Professional ---                           K83.8, Other specified diseases of biliary tract                           K80.50, Calculus of bile duct without cholangitis                            or cholecystitis without obstruction                           K83.9, Disease of biliary tract, unspecified                           R93.2, Abnormal findings on  diagnostic imaging of  liver and biliary tract CPT copyright 2018 American Medical Association. All rights reserved. The codes documented in this report are preliminary and upon coder review may  be revised to meet current compliance requirements. Justice Britain, MD 02/08/2018 3:35:01 PM Number of Addenda: 0

## 2018-02-08 NOTE — Plan of Care (Signed)
  Problem: Spiritual Needs Goal: Ability to function at adequate level Outcome: Progressing   

## 2018-02-08 NOTE — Anesthesia Procedure Notes (Signed)
Procedure Name: Intubation Date/Time: 02/08/2018 2:00 PM Performed by: Rogelia Boga, CRNA Pre-anesthesia Checklist: Patient identified, Emergency Drugs available, Suction available and Patient being monitored Patient Re-evaluated:Patient Re-evaluated prior to induction Oxygen Delivery Method: Circle System Utilized Preoxygenation: Pre-oxygenation with 100% oxygen Induction Type: IV induction Ventilation: Mask ventilation without difficulty Laryngoscope Size: Glidescope and 4 Tube type: Oral Tube size: 7.5 mm Number of attempts: 1 Airway Equipment and Method: Stylet and Oral airway Placement Confirmation: ETT inserted through vocal cords under direct vision,  positive ETCO2 and breath sounds checked- equal and bilateral Secured at: 22 cm Tube secured with: Tape Dental Injury: Teeth and Oropharynx as per pre-operative assessment

## 2018-02-08 NOTE — Op Note (Signed)
[facility] Patient Name: Dawn Gonzales Procedure Date : 02/08/2018 MRN: [id number] Attending MD: Corliss Parish , MD Date of Birth: 1941/02/11 CSN: [id number] Age: 77 Admit Type: Inpatient Procedure:                Upper EUS Indications:              Limited evaluation to confirm Choledocholithiasis                            on MRCP, Obstruction of bile duct on MRCP, Abnormal                            MRCP Providers:                Corliss Parish, MD, Dwain Sarna, RN, Beryle Beams, Technician Referring MD:             Tressia Danas MD, MD, Emelia Loron, Dr.                            Ella Jubilee ([facility]) Medicines:                General Anesthesia Complications:            No immediate complications. Estimated Blood Loss:     Estimated blood loss: none. Procedure:                Pre-Anesthesia Assessment:                           - Prior to the procedure, a History and Physical                            was performed, and patient medications and                            allergies were reviewed. The patient's tolerance of                            previous anesthesia was also reviewed. The risks                            and benefits of the procedure and the sedation                            options and risks were discussed with the patient.                            All questions were answered, and informed consent                            was obtained. Prior Anticoagulants: The patient has                            taken heparin. ASA  Grade Assessment: IV - A patient                            with severe systemic disease that is a constant                            threat to life. After reviewing the risks and                            benefits, the patient was deemed in satisfactory                            condition to undergo the procedure.                           After obtaining informed consent,  the endoscope was                            passed under direct vision. Throughout the                            procedure, the patient's blood pressure, pulse, and                            oxygen saturations were monitored continuously. The                            [id number] ([id number]) Olympus Linear EUS was                            introduced through the mouth, and advanced to the                            second part of duodenum. The upper EUS was                            accomplished without difficulty. The patient                            tolerated the procedure. Findings:      ENDOSCOPIC FINDING: :      No gross lesions were noted in the entire examined stomach.      No gross lesions were noted in the duodenal bulb, in the first portion       of the duodenum and in the second portion of the duodenum.      A mild bulging of the ampulla as well as gaping orifice deformity was       found in the ampulla.      ENDOSONOGRAPHIC FINDING: :      There was dilation in the common bile duct (8 mm) and in the common       hepatic duct (13.8 mm).      One filling defect consistent with likely stone was visualized       endosonographically in the common bile duct. The stone was irregular. It  was hyperechoic and characterized by shadowing.      One large stone was visualized endosonographically in the gallbladder       body. The stone was oval. It was hyperechoic and characterized by       shadowing. The gallbladder wall was thickened around to approximately 5       mm - concerning for concomittent cholecystitis).      There was no sign of significant endosonographic abnormality in the       pancreatic head. The pancreatic duct measured up to 2 mm in diameter.       The pancreatic duct was thin in caliber. Impression:               - No gross lesions in the stomach.                           - No gross lesions in the duodenal bulb, in the                            first  portion of the duodenum and in the second                            portion of the duodenum.                           - Duodenal deformity.                           - There was dilation in the common bile duct and in                            the common hepatic duct.                           - One stone was visualized endosonographically in                            the common bile duct.                           - One stone was visualized endosonographically in                            the gallbladder body. Gallbladder wall thickening                            was noted suggestive of cholecystitis. Recommendation:           - Proceed to ERCP for attempt at stone extraction.                           - The findings and recommendations were discussed                            with the patient and patient's family after  upcoming ERCP. Procedure Code(s):        --- Professional ---                           [phone], Esophagogastroduodenoscopy, flexible,                            transoral; with endoscopic ultrasound examination                            limited to the esophagus, stomach or duodenum, and                            adjacent structures Diagnosis Code(s):        --- Professional ---                           K80.51, Calculus of bile duct without cholangitis                            or cholecystitis with obstruction                           K31.89, Other diseases of stomach and duodenum                           K80.20, Calculus of gallbladder without                            cholecystitis without obstruction                           K83.8, Other specified diseases of biliary tract                           R93.2, Abnormal findings on diagnostic imaging of                            liver and biliary tract CPT copyright 2018 American Medical Association. All rights reserved. The codes documented in this report are preliminary and upon  coder review may  be revised to meet current compliance requirements. Corliss Parish, MD 02/08/2018 3:34:24 PM Number of Addenda: 0

## 2018-02-09 ENCOUNTER — Inpatient Hospital Stay (HOSPITAL_COMMUNITY): Payer: Medicare PPO

## 2018-02-09 ENCOUNTER — Encounter (HOSPITAL_COMMUNITY): Payer: Self-pay | Admitting: Gastroenterology

## 2018-02-09 DIAGNOSIS — A419 Sepsis, unspecified organism: Secondary | ICD-10-CM

## 2018-02-09 DIAGNOSIS — R001 Bradycardia, unspecified: Secondary | ICD-10-CM

## 2018-02-09 DIAGNOSIS — K8043 Calculus of bile duct with acute cholecystitis with obstruction: Principal | ICD-10-CM

## 2018-02-09 DIAGNOSIS — R1013 Epigastric pain: Secondary | ICD-10-CM

## 2018-02-09 HISTORY — PX: IR PERC CHOLECYSTOSTOMY: IMG2326

## 2018-02-09 LAB — BASIC METABOLIC PANEL
Anion gap: 7 (ref 5–15)
BUN: 14 mg/dL (ref 8–23)
CO2: 29 mmol/L (ref 22–32)
Calcium: 8.3 mg/dL — ABNORMAL LOW (ref 8.9–10.3)
Chloride: 101 mmol/L (ref 98–111)
Creatinine, Ser: 1.13 mg/dL — ABNORMAL HIGH (ref 0.44–1.00)
GFR calc Af Amer: 53 mL/min — ABNORMAL LOW (ref >60)
GFR calc non Af Amer: 46 mL/min — ABNORMAL LOW (ref >60)
Glucose, Bld: 135 mg/dL — ABNORMAL HIGH (ref 70–99)
Potassium: 3.8 mmol/L (ref 3.5–5.1)
Sodium: 137 mmol/L (ref 135–145)

## 2018-02-09 LAB — CBC WITH DIFFERENTIAL/PLATELET
Abs Immature Granulocytes: 0.09 10*3/uL — ABNORMAL HIGH (ref 0.00–0.07)
Basophils Absolute: 0 10*3/uL (ref 0.0–0.1)
Basophils Relative: 0 %
Eosinophils Absolute: 0 10*3/uL (ref 0.0–0.5)
Eosinophils Relative: 0 %
HCT: 35.2 % — ABNORMAL LOW (ref 36.0–46.0)
Hemoglobin: 10.7 g/dL — ABNORMAL LOW (ref 12.0–15.0)
Immature Granulocytes: 1 %
Lymphocytes Relative: 6 %
Lymphs Abs: 1 10*3/uL (ref 0.7–4.0)
MCH: 29.7 pg (ref 26.0–34.0)
MCHC: 30.4 g/dL (ref 30.0–36.0)
MCV: 97.8 fL (ref 80.0–100.0)
Monocytes Absolute: 0.8 10*3/uL (ref 0.1–1.0)
Monocytes Relative: 4 %
Neutro Abs: 15.8 10*3/uL — ABNORMAL HIGH (ref 1.7–7.7)
Neutrophils Relative %: 89 %
Platelets: 249 10*3/uL (ref 150–400)
RBC: 3.6 MIL/uL — ABNORMAL LOW (ref 3.87–5.11)
RDW: 14.5 % (ref 11.5–15.5)
WBC: 17.7 10*3/uL — ABNORMAL HIGH (ref 4.0–10.5)
nRBC: 0 % (ref 0.0–0.2)

## 2018-02-09 MED ORDER — PHENOL 1.4 % MT LIQD
1.0000 | OROMUCOSAL | Status: DC | PRN
  Administered 2018-02-09: 1 via OROMUCOSAL
  Filled 2018-02-09: qty 177

## 2018-02-09 MED ORDER — PIPERACILLIN-TAZOBACTAM 3.375 G IVPB
3.3750 g | Freq: Three times a day (TID) | INTRAVENOUS | Status: DC
  Administered 2018-02-09 – 2018-02-10 (×3): 3.375 g via INTRAVENOUS
  Filled 2018-02-09 (×3): qty 50

## 2018-02-09 MED ORDER — SODIUM CHLORIDE 0.9% FLUSH
5.0000 mL | Freq: Three times a day (TID) | INTRAVENOUS | Status: DC
  Administered 2018-02-09 – 2018-02-13 (×10): 5 mL

## 2018-02-09 MED ORDER — HYDROMORPHONE HCL 1 MG/ML IJ SOLN
INTRAMUSCULAR | Status: AC
  Filled 2018-02-09: qty 1

## 2018-02-09 MED ORDER — IOPAMIDOL (ISOVUE-300) INJECTION 61%
INTRAVENOUS | Status: AC
  Filled 2018-02-09: qty 50

## 2018-02-09 MED ORDER — MIDAZOLAM HCL 2 MG/2ML IJ SOLN
INTRAMUSCULAR | Status: AC
  Filled 2018-02-09: qty 2

## 2018-02-09 MED ORDER — LIDOCAINE HCL 1 % IJ SOLN
INTRAMUSCULAR | Status: AC
  Filled 2018-02-09: qty 20

## 2018-02-09 MED ORDER — FENTANYL CITRATE (PF) 100 MCG/2ML IJ SOLN
INTRAMUSCULAR | Status: AC
  Filled 2018-02-09: qty 2

## 2018-02-09 MED ORDER — MIDAZOLAM HCL 2 MG/2ML IJ SOLN
INTRAMUSCULAR | Status: AC | PRN
  Administered 2018-02-09: 0.5 mg via INTRAVENOUS
  Administered 2018-02-09: 1 mg via INTRAVENOUS

## 2018-02-09 MED ORDER — FENTANYL CITRATE (PF) 100 MCG/2ML IJ SOLN
INTRAMUSCULAR | Status: AC | PRN
  Administered 2018-02-09: 50 ug via INTRAVENOUS

## 2018-02-09 NOTE — Progress Notes (Signed)
Patient ID: Dawn Gonzales, female   DOB: 05/17/1940, 77 y.o.   MRN: [id number] [facility] Progress Note:   1 Day Post-Op  Subjective: Mental status is clear;  No compaints of pain Objective: Vital signs in last 24 hours: Temp:  [97.7 F (36.5 C)-100.9 F (38.3 C)] 97.7 F (36.5 C) (10/12 0526) Pulse Rate:  [68-97] 74 (10/12 0526) Resp:  [16-33] 16 (10/12 0526) BP: (92-164)/(58-95) 103/63 (10/12 0526) SpO2:  [77 %-100 %] 97 % (10/12 0526) Arterial Line BP: (122-131)/(49-53) 122/49 (10/11 1551)  Intake/Output from previous day: 10/11 0701 - 10/12 0700 In: 1630.1 [P.O.:300; I.V.:1176.1; IV Piggyback:154] Out: 1100 [Urine:1100] Intake/Output this shift: No intake/output data recorded.  Physical Exam: Work of breathing is not labored at rest.  No complaints of pain  Lab Results:  Results for orders placed or performed during the hospital encounter of 02/06/18 (from the past 48 hour(s))  Protime-INR     Status: None   Collection Time: 02/07/18  5:05 PM  Result Value Ref Range   Prothrombin Time 13.8 11.4 - 15.2 seconds   INR 1.07     Comment: Performed at [facility] [address] [place], [place] [address]  Comprehensive metabolic panel     Status: Abnormal   Collection Time: 02/08/18  2:22 AM  Result Value Ref Range   Sodium 140 135 - 145 mmol/L   Potassium 3.9 3.5 - 5.1 mmol/L   Chloride 102 98 - 111 mmol/L   CO2 31 22 - 32 mmol/L   Glucose, Bld 103 (H) 70 - 99 mg/dL   BUN 8 8 - 23 mg/dL   Creatinine, Ser 0.68 0.44 - 1.00 mg/dL   Calcium 9.0 8.9 - 10.3 mg/dL   Total Protein 5.9 (L) 6.5 - 8.1 g/dL   Albumin 3.0 (L) 3.5 - 5.0 g/dL   AST 18 15 - 41 U/L   ALT 11 0 - 44 U/L   Alkaline Phosphatase 63 38 - 126 U/L   Total Bilirubin 0.5 0.3 - 1.2 mg/dL   GFR calc non Af Amer >60 >60 mL/min   GFR calc Af Amer >60 >60 mL/min    Comment: (NOTE) The eGFR has been calculated using the CKD EPI equation. This calculation has not been validated in all  clinical situations. eGFR's persistently <60 mL/min signify possible Chronic Kidney Disease.    Anion gap 7 5 - 15    Comment: Performed at [facility] [address], [place], [place] [address]  Glucose, capillary     Status: None   Collection Time: 02/08/18 10:22 AM  Result Value Ref Range   Glucose-Capillary 92 70 - 99 mg/dL   Comment 1 Notify RN   CBC with Differential/Platelet     Status: Abnormal   Collection Time: 02/09/18  1:11 AM  Result Value Ref Range   WBC 17.7 (H) 4.0 - 10.5 K/uL   RBC 3.60 (L) 3.87 - 5.11 MIL/uL   Hemoglobin 10.7 (L) 12.0 - 15.0 g/dL   HCT 35.2 (L) 36.0 - 46.0 %   MCV 97.8 80.0 - 100.0 fL   MCH 29.7 26.0 - 34.0 pg   MCHC 30.4 30.0 - 36.0 g/dL   RDW 14.5 11.5 - 15.5 %   Platelets 249 150 - 400 K/uL   nRBC 0.0 0.0 - 0.2 %   Neutrophils Relative % 89 %   Neutro Abs 15.8 (H) 1.7 - 7.7 K/uL   Lymphocytes Relative 6 %   Lymphs Abs  1.0 0.7 - 4.0 K/uL   Monocytes Relative 4 %   Monocytes Absolute 0.8 0.1 - 1.0 K/uL   Eosinophils Relative 0 %   Eosinophils Absolute 0.0 0.0 - 0.5 K/uL   Basophils Relative 0 %   Basophils Absolute 0.0 0.0 - 0.1 K/uL   Immature Granulocytes 1 %   Abs Immature Granulocytes 0.09 (H) 0.00 - 0.07 K/uL    Comment: Performed at [facility] [address], [place], [place] [address]  Basic metabolic panel     Status: Abnormal   Collection Time: 02/09/18  1:11 AM  Result Value Ref Range   Sodium 137 135 - 145 mmol/L   Potassium 3.8 3.5 - 5.1 mmol/L   Chloride 101 98 - 111 mmol/L   CO2 29 22 - 32 mmol/L   Glucose, Bld 135 (H) 70 - 99 mg/dL   BUN 14 8 - 23 mg/dL   Creatinine, Ser 1.13 (H) 0.44 - 1.00 mg/dL   Calcium 8.3 (L) 8.9 - 10.3 mg/dL   GFR calc non Af Amer 46 (L) >60 mL/min   GFR calc Af Amer 53 (L) >60 mL/min    Comment: (NOTE) The eGFR has been calculated using the CKD EPI equation. This calculation has not been validated in all clinical situations. eGFR's persistently <60 mL/min signify possible  Chronic Kidney Disease.    Anion gap 7 5 - 15    Comment: Performed at [facility] [address], [place], [place] [address]    Radiology/Results: Dg Ercp Biliary & Pancreatic Ducts  Result Date: 02/08/2018 CLINICAL DATA:  Choledocholithiasis EXAM: ERCP TECHNIQUE: Multiple spot images obtained with the fluoroscopic device and submitted for interpretation post-procedure. FLUOROSCOPY TIME:  Fluoroscopy Time:  2 minutes and 34 seconds Radiation Exposure Index (if provided by the fluoroscopic device): Number of Acquired Spot Images: 0 COMPARISON:  None. FINDINGS: Several images demonstrate cannulation of the common bile duct and contrast filling the biliary tree. Balloon stone retrieval is documented. IMPRESSION: See above. These images were submitted for radiologic interpretation only. Please see the procedural report for the amount of contrast and the fluoroscopy time utilized. Electronically Signed   By: Marybelle Killings M.D.   On: 02/08/2018 15:59    Anti-infectives: Anti-infectives (From admission, onward)   Start     Dose/Rate Route Frequency Ordered Stop   02/08/18 1800  piperacillin-tazobactam (ZOSYN) IVPB 3.375 g     3.375 g 12.5 mL/hr over 240 Minutes Intravenous Every 8 hours 02/08/18 1516     02/07/18 1700  cefTRIAXone (ROCEPHIN) 2 g in sodium chloride 0.9 % 100 mL IVPB  Status:  Discontinued     2 g 200 mL/hr over 30 Minutes Intravenous Every 24 hours 02/07/18 1320 02/08/18 1516   02/07/18 1315  cefTRIAXone (ROCEPHIN) 2 g in sodium chloride 0.9 % 100 mL IVPB  Status:  Discontinued     2 g 200 mL/hr over 30 Minutes Intravenous Every 24 hours 02/07/18 1309 02/07/18 1320   02/06/18 1530  cefTRIAXone (ROCEPHIN) 2 g in sodium chloride 0.9 % 100 mL IVPB     2 g 200 mL/hr over 30 Minutes Intravenous  Once 02/06/18 1524 02/06/18 1919      Assessment/Plan: Problem List: Patient Active Problem List   Diagnosis Date Noted  . Cholecystitis, acute with cholelithiasis 02/07/2018   . Calculus of gallbladder with biliary obstruction but without cholecystitis   . Preoperative cardiovascular examination   . Acute cholecystitis   . Severe aortic stenosis   .  Choledocholithiasis   . N&V (nausea and vomiting) 02/06/2018  . Epigastric pain 02/06/2018    LIkely patient will need percutaneous drainage of gallbladder at some point.   1 Day Post-Op    LOS: 2 days   Matt B. Hassell Done, MD, [facility] [phone] beeper [phone]  02/09/2018 8:44 AM

## 2018-02-09 NOTE — Progress Notes (Addendum)
PROGRESS NOTE    Dawn Gonzales  ZOX:[id number] DOB: 06-Aug-1940 DOA: 02/06/2018 PCP: Benita Stabile, MD    Brief Narrative:  77 year old female who presented with abdominal pain.  She does have significant past medical history for severe aortic stenosis, depression, obesity and dyslipidemia.  Patient reported severe epigastric abdominal pain associated with nausea. On her initial physical examination blood pressure 147/87, heart rate 66, temperature 98.2, respiratory rate 18, oxygen saturation 91%.  Moist mucous membranes, lungs clear to auscultation bilaterally, heart S1-S2 present and rhythmic, bowel sounds were positive, abdomen soft, but tender on the right side, negative Murphy sign, no lower extremity edema.  Sodium 139, potassium 3.8, chloride 105, bicarb 26, glucose 108, BUN 18, creatinine 0.6, white count 9.2, hemoglobin 11.7, hematocrit 38.5, platelets 255, urinalysis negative for infection, chest x-ray with no infiltrates, right hemidiaphragm elevation, abdominal ultrasound with cholelithiasis, moderate gallbladder wall thickening and common bile duct dilatation.  EKG sinus rhythm, left axis deviation, left anterior fascicular block, right bundle branch block.   Patient was admitted to the hospital with the working diagnosis of abdominal pain due to cholecystitis complicated by common bile duct obstruction   Assessment & Plan:   Principal Problem:   Epigastric pain Active Problems:   N&V (nausea and vomiting)   Calculus of gallbladder with biliary obstruction but without cholecystitis   Preoperative cardiovascular examination   Acute cholecystitis   Severe aortic stenosis   Choledocholithiasis   Cholecystitis, acute with cholelithiasis   1. Acute cholecystitis complicated with choledocholithiasis and sepsis (not present on admission). Underwent Endoscopic [place] and ERCP successfully, stone was removed completely and biliary sphincterectomy was performed. This am continue to be  NPO, for IR percutaneous cholecystostomy tube placement. Positive fever and leukocytosis (wbc 17), consistent with NEW sepsis. Will continue antibiotic therapy with IV Zosyn and gentle IV fluids with isotonic saline. This am blood pressure systolic 98 to 103 mmHg. Will bolus as needed. Avoid volume overload. Follow on cultures and temperature curve.   2. Severe aortic stenosis. Echocardiogram with preserved LV systolic function, EF 55 to 60%. Follow closely blood pressure, will use bolus saline as needed. Patient has limited physical functional capacity at home (mainly uses wheelchair), but has been asymptomatic. Patient may qualify for cholecystectomy at a later time with close perioperative and postoperative hemodynamic monitoring. Follow with cardiology recommendations. Note that this is second episode requiring cholecystostomy tube.    3. Sinus bradycardia with sinus pauses. Suspected obstructive sleep apnea, will check EKG today, patient is tolerating well Cpap at night, will continue telemetry monitoring. Avoid AV blockade.   4. Dyslipidemia. On pravastatin with good toleration.   5. Morbid obesity. BMI 43. Imposes extra surgical-pulmonary risk. Recommend outpatient follow up and sleep study as outpatient.   DVT prophylaxis: heparin   Code Status:  full Family Communication: no family at the bedside  Disposition Plan/ discharge barriers: pending cholecystostomy tube.    Consultants:   GI   Surgery   Cardiology   Procedures:     Antimicrobials:       Subjective: Patient continue to have abdominal pain but has improved in intensity, had fever spikes and episodic hypotension. Antibiotic therapy was up-escalated. Tolerated well cpap last night.   Objective: Vitals:   02/08/18 2101 02/08/18 2245 02/09/18 0238 02/09/18 0526  BP: (!) 92/59  98/62 103/63  Pulse: 90  70 74  Resp: 17  16 16   Temp: 98.3 F (36.8 C)  98 F (36.7 C) 97.7 F (36.5 C)  TempSrc:  Oral   Oral Oral  SpO2: 97% 98% 97% 97%  Weight:      Height:        Intake/Output Summary (Last 24 hours) at 02/09/2018 0824 Last data filed at 02/09/2018 0600 Gross per 24 hour  Intake 1630.07 ml  Output 1100 ml  Net 530.07 ml   Filed Weights   02/07/18 0500 02/07/18 1513 02/08/18 0500  Weight: 122.3 kg 125.3 kg 124.3 kg    Examination:   General: deconditioned  Neurology: Awake and alert, non focal  E ENT: mild pallor, no icterus, oral mucosa moist Cardiovascular: No JVD. S1-S2 present, rhythmic, no gallops, or rubs, positive murmur 3/6 at the right upper sternal border with radiation to the carotids. No lower extremity edema. Pulmonary: positive breath sounds bilaterally, adequate air movement, no wheezing, rhonchi or rales. Gastrointestinal. Abdomen protuberant with no organomegaly, positive tenderness at the right, to deep palpation, no rebound or guarding Skin. No rashes Musculoskeletal: no joint deformities     Data Reviewed: I have personally reviewed following labs and imaging studies  CBC: Recent Labs  Lab 02/06/18 1251 02/07/18 0513 02/09/18 0111  WBC 9.2 7.7 17.7*  NEUTROABS 6.9  --  15.8*  HGB 11.7* 10.7* 10.7*  HCT 38.5 34.5* 35.2*  MCV 98.0 99.1 97.8  PLT 255 244 249   Basic Metabolic Panel: Recent Labs  Lab 02/06/18 1251 02/07/18 0513 02/08/18 0222 02/09/18 0111  NA 139 141 140 137  K 3.8 3.9 3.9 3.8  CL 105 105 102 101  CO2 26 30 31 29   GLUCOSE 108* 101* 103* 135*  BUN 18 13 8 14   CREATININE 0.60 0.62 0.68 1.13*  CALCIUM 9.2 8.8* 9.0 8.3*   GFR: Estimated Creatinine Clearance: 56.1 mL/min (A) (by C-G formula based on SCr of 1.13 mg/dL (H)). Liver Function Tests: Recent Labs  Lab 02/06/18 1251 02/07/18 0513 02/08/18 0222  AST 17 15 18   ALT 12 11 11   ALKPHOS 69 62 63  BILITOT 0.6 0.6 0.5  PROT 6.8 6.3* 5.9*  ALBUMIN 3.6 3.3* 3.0*   Recent Labs  Lab 02/06/18 1251  LIPASE 23   No results for input(s): AMMONIA in the last 168  hours. Coagulation Profile: Recent Labs  Lab 02/07/18 1705  INR 1.07   Cardiac Enzymes: Recent Labs  Lab 02/06/18 1251 02/06/18 1819 02/06/18 2302 02/07/18 0513  TROPONINI <0.03 <0.03 <0.03 <0.03   BNP (last 3 results) No results for input(s): PROBNP in the last 8760 hours. HbA1C: No results for input(s): HGBA1C in the last 72 hours. CBG: Recent Labs  Lab 02/08/18 1022  GLUCAP 92   Lipid Profile: No results for input(s): CHOL, HDL, LDLCALC, TRIG, CHOLHDL, LDLDIRECT in the last 72 hours. Thyroid Function Tests: No results for input(s): TSH, T4TOTAL, FREET4, T3FREE, THYROIDAB in the last 72 hours. Anemia Panel: No results for input(s): VITAMINB12, FOLATE, FERRITIN, TIBC, IRON, RETICCTPCT in the last 72 hours.    Radiology Studies: I have reviewed all of the imaging during this hospital visit personally     Scheduled Meds: . aspirin EC  81 mg Oral Daily  . calcium carbonate  1,250 mg Oral Daily  . cholecalciferol  1,000 Units Oral Daily  . gabapentin  300 mg Oral TID  . heparin injection (subcutaneous)  5,000 Units Subcutaneous Q8H  . indomethacin  100 mg Rectal Once  . mouth rinse  15 mL Mouth Rinse BID  . multivitamin with minerals  1 tablet Oral Daily  . pantoprazole  40 mg  Oral Daily  . pravastatin  20 mg Oral q1800  . sodium chloride flush  3 mL Intravenous Q12H  . venlafaxine  37.5 mg Oral Daily  . vitamin C  250 mg Oral Daily   Continuous Infusions: . sodium chloride Stopped (02/08/18 1149)  . sodium chloride    . dextrose 5 % and 0.9% NaCl Stopped (02/08/18 1758)  . piperacillin-tazobactam 3.375 g (02/09/18 0211)     LOS: 2 days        Mauricio Annett Gula, MD [facility] Pager [phone]

## 2018-02-09 NOTE — Progress Notes (Signed)
DAILY PROGRESS NOTE   Patient Name: Dawn Gonzales Date of Encounter: 02/09/2018  Chief Complaint   Abdominal pain  Patient Profile   Dawn Gonzales is a 77 y.o. female with a history of long-standing heart murmur and apparent aortic stenosis, SBE 2017 while living in [facility], right bundle branch block, hyperlipidemia, neuropathy, and depression who is being seen today for preoperative cardiac evaluation at the request of Dr. Arnoldo Morale.  Subjective   No issues overnight - some generalized abdominal pain, ?constipation. Overnight noted to have junctional bradycardia - no diagnosis of OSA, but was put on CPAP. Would recommend using it and continue telemetry monitoring.  Objective   Vitals:   02/08/18 2101 02/08/18 2245 02/09/18 0238 02/09/18 0526  BP: (!) 92/59  98/62 103/63  Pulse: 90  70 74  Resp: '17  16 16  ' Temp: 98.3 F (36.8 C)  98 F (36.7 C) 97.7 F (36.5 C)  TempSrc: Oral  Oral Oral  SpO2: 97% 98% 97% 97%  Weight:      Height:        Intake/Output Summary (Last 24 hours) at 02/09/2018 0906 Last data filed at 02/09/2018 0600 Gross per 24 hour  Intake 1630.07 ml  Output 1100 ml  Net 530.07 ml   Filed Weights   02/07/18 0500 02/07/18 1513 02/08/18 0500  Weight: 122.3 kg 125.3 kg 124.3 kg    Physical Exam   General appearance: alert, no distress and morbidly obese Lungs: clear to auscultation bilaterally Heart: regular rate and rhythm, S1: normal, S2: decreased intensity and systolic murmur: systolic ejection 3/6, musical at 2nd right intercostal space Extremities: extremities normal, atraumatic, no cyanosis or edema Neurologic: Grossly normal  Inpatient Medications    Scheduled Meds: . aspirin EC  81 mg Oral Daily  . calcium carbonate  1,250 mg Oral Daily  . cholecalciferol  1,000 Units Oral Daily  . gabapentin  300 mg Oral TID  . heparin injection (subcutaneous)  5,000 Units Subcutaneous Q8H  . indomethacin  100 mg Rectal Once  .  mouth rinse  15 mL Mouth Rinse BID  . multivitamin with minerals  1 tablet Oral Daily  . pantoprazole  40 mg Oral Daily  . pravastatin  20 mg Oral q1800  . sodium chloride flush  3 mL Intravenous Q12H  . venlafaxine  37.5 mg Oral Daily  . vitamin C  250 mg Oral Daily    Continuous Infusions: . sodium chloride Stopped (02/08/18 1149)  . sodium chloride    . dextrose 5 % and 0.9% NaCl Stopped (02/08/18 1758)  . piperacillin-tazobactam 3.375 g (02/09/18 0211)    PRN Meds: sodium chloride, sodium chloride, acetaminophen **OR** acetaminophen, hydrALAZINE, HYDROmorphone (DILAUDID) injection, ondansetron (ZOFRAN) IV, sodium chloride flush   Labs   Results for orders placed or performed during the hospital encounter of 02/06/18 (from the past 48 hour(s))  Protime-INR     Status: None   Collection Time: 02/07/18  5:05 PM  Result Value Ref Range   Prothrombin Time 13.8 11.4 - 15.2 seconds   INR 1.07     Comment: Performed at [facility], [address], [place], [place] [address]  Comprehensive metabolic panel     Status: Abnormal   Collection Time: 02/08/18  2:22 AM  Result Value Ref Range   Sodium 140 135 - 145 mmol/L   Potassium 3.9 3.5 - 5.1 mmol/L   Chloride 102 98 - 111 mmol/L   CO2 31 22 - 32 mmol/L  Glucose, Bld 103 (H) 70 - 99 mg/dL   BUN 8 8 - 23 mg/dL   Creatinine, Ser 0.68 0.44 - 1.00 mg/dL   Calcium 9.0 8.9 - 10.3 mg/dL   Total Protein 5.9 (L) 6.5 - 8.1 g/dL   Albumin 3.0 (L) 3.5 - 5.0 g/dL   AST 18 15 - 41 U/L   ALT 11 0 - 44 U/L   Alkaline Phosphatase 63 38 - 126 U/L   Total Bilirubin 0.5 0.3 - 1.2 mg/dL   GFR calc non Af Amer >60 >60 mL/min   GFR calc Af Amer >60 >60 mL/min    Comment: (NOTE) The eGFR has been calculated using the CKD EPI equation. This calculation has not been validated in all clinical situations. eGFR's persistently <60 mL/min signify possible Chronic Kidney Disease.    Anion gap 7 5 - 15    Comment: Performed at [facility] [address], [place], [place] [address]  Glucose, capillary     Status: None   Collection Time: 02/08/18 10:22 AM  Result Value Ref Range   Glucose-Capillary 92 70 - 99 mg/dL   Comment 1 Notify RN   CBC with Differential/Platelet     Status: Abnormal   Collection Time: 02/09/18  1:11 AM  Result Value Ref Range   WBC 17.7 (H) 4.0 - 10.5 K/uL   RBC 3.60 (L) 3.87 - 5.11 MIL/uL   Hemoglobin 10.7 (L) 12.0 - 15.0 g/dL   HCT 35.2 (L) 36.0 - 46.0 %   MCV 97.8 80.0 - 100.0 fL   MCH 29.7 26.0 - 34.0 pg   MCHC 30.4 30.0 - 36.0 g/dL   RDW 14.5 11.5 - 15.5 %   Platelets 249 150 - 400 K/uL   nRBC 0.0 0.0 - 0.2 %   Neutrophils Relative % 89 %   Neutro Abs 15.8 (H) 1.7 - 7.7 K/uL   Lymphocytes Relative 6 %   Lymphs Abs 1.0 0.7 - 4.0 K/uL   Monocytes Relative 4 %   Monocytes Absolute 0.8 0.1 - 1.0 K/uL   Eosinophils Relative 0 %   Eosinophils Absolute 0.0 0.0 - 0.5 K/uL   Basophils Relative 0 %   Basophils Absolute 0.0 0.0 - 0.1 K/uL   Immature Granulocytes 1 %   Abs Immature Granulocytes 0.09 (H) 0.00 - 0.07 K/uL    Comment: Performed at [facility], [address], [place], [place] [address]  Basic metabolic panel     Status: Abnormal   Collection Time: 02/09/18  1:11 AM  Result Value Ref Range   Sodium 137 135 - 145 mmol/L   Potassium 3.8 3.5 - 5.1 mmol/L   Chloride 101 98 - 111 mmol/L   CO2 29 22 - 32 mmol/L   Glucose, Bld 135 (H) 70 - 99 mg/dL   BUN 14 8 - 23 mg/dL   Creatinine, Ser 1.13 (H) 0.44 - 1.00 mg/dL   Calcium 8.3 (L) 8.9 - 10.3 mg/dL   GFR calc non Af Amer 46 (L) >60 mL/min   GFR calc Af Amer 53 (L) >60 mL/min    Comment: (NOTE) The eGFR has been calculated using the CKD EPI equation. This calculation has not been validated in all clinical situations. eGFR's persistently <60 mL/min signify possible Chronic Kidney Disease.    Anion gap 7 5 - 15    Comment: Performed at [facility] [address] [place], [place] [address]    ECG  Pending  Telemetry   Junctional bradycardia overnight - Personally Reviewed  Radiology    Dg Ercp Biliary & Pancreatic Ducts  Result Date: 02/08/2018 CLINICAL DATA:  Choledocholithiasis EXAM: ERCP TECHNIQUE: Multiple spot images obtained with the fluoroscopic device and submitted for interpretation post-procedure. FLUOROSCOPY TIME:  Fluoroscopy Time:  2 minutes and 34 seconds Radiation Exposure Index (if provided by the fluoroscopic device): Number of Acquired Spot Images: 0 COMPARISON:  None. FINDINGS: Several images demonstrate cannulation of the common bile duct and contrast filling the biliary tree. Balloon stone retrieval is documented. IMPRESSION: See above. These images were submitted for radiologic interpretation only. Please see the procedural report for the amount of contrast and the fluoroscopy time utilized. Electronically Signed   By: Marybelle Killings M.D.   On: 02/08/2018 15:59    Cardiac Studies   N/A  Assessment   1. Principal Problem: 2.   Epigastric pain 3. Active Problems: 4.   N&V (nausea and vomiting) 5.   Calculus of gallbladder with biliary obstruction but without cholecystitis 6.   Preoperative cardiovascular examination 7.   Acute cholecystitis 8.   Severe aortic stenosis 9.   Choledocholithiasis 10.   Cholecystitis, acute with cholelithiasis 11.   Plan   1. Severe aortic stenosis - asymptomatic at this point. Noted to have junctional bradycardia overnight while asleep - may be related to undiagnosed OSA. Noted to have been placed on CPAP, ?after or before arrhythmias - monitor for this again tonight. Will eventually need TAVR evaluation, likely as outpatient.  Time Spent Directly with Patient:  I have spent a total of 25 minutes with the patient reviewing hospital notes, telemetry, EKGs, labs and examining the patient as well as establishing an assessment and plan that was discussed personally with the patient.  > 50% of time was spent in direct patient  care.  Length of Stay:  LOS: 2 days   Pixie Casino, MD, [facility], [place] Director of the Advanced Lipid Disorders &  Cardiovascular Risk Reduction Clinic Diplomate of the American Board of Clinical Lipidology Attending Cardiologist  Direct Dial: [phone]  Fax: [phone]  Website:  [email or web address] 02/09/2018, 9:06 AM

## 2018-02-09 NOTE — Progress Notes (Signed)
RT placed pt on CPAP for the night. Pt states it is comfortable and made her feel much better after wearing last night. Pt on dream station auto titrate 10/5 with 2lpm bled in to the unit. RT will continue to monitor.

## 2018-02-09 NOTE — H&P (Signed)
Chief Complaint: Patient was seen in consultation today for percutaneous cholecystostomy  Referring Physician(s): Mattie Marlin, PA-C  Supervising Physician: Richarda Overlie  Patient Status: [facility] - In-pt  History of Present Illness: Dawn Gonzales is a 77 y.o. female with a past medical history significant for HLD, severe aortic stenosis and depression who presented to [facility] ED on 10/9 with complaints of nausea and epigastric pain starting that morning. RUQ US showed cholelithiasis with moderate gallbladder wall thickening, dilated common bile duct and positive Murphy's sign. She was admitted for further evaluation and management. Consults were placed to GI and surgery - GI proceeded with ERCP on 10/11 where choledocholithiasis was found and removed and biliary sphincterectomy performed. IR has been consulted for percutaneous cholecystostomy placement.  Patient with previous cholecystostomy placed in [place] approximately 1 year ago and was removed after about 6 weeks without recurrence of s/s until now. Patient reports she feels good overall, did have some bradycardia overnight for which she was placed on CPAP which improved her heart rate. She does c/o constipation which is fairly normal for her, tolerating clear liquids well. She otherwise has no complaints.  Past Medical History:  Diagnosis Date  . Depression   . Heart murmur   . History of cellulitis   . History of endocarditis   . Hyperlipemia   . Neuropathy     Past Surgical History:  Procedure Laterality Date  . Abdominal gangrene    . ADENOIDECTOMY    . Cataract surgery    . COLONOSCOPY    . TONSILLECTOMY    . Tummy tuck      Allergies: Patient has no known allergies.  Medications: Prior to Admission medications   Medication Sig Start Date End Date Taking? Authorizing Provider  Ascorbic Acid (VITAMIN C) 100 MG tablet Take 100 mg by mouth daily.   Yes [provider]  aspirin EC 81 MG tablet Take 81 mg by mouth  daily.   Yes [provider]  Calcium Carbonate (CALCIUM-CARB 600 PO) Take 600 mg by mouth daily.   Yes [provider]  cholecalciferol (VITAMIN D) 1000 units tablet Take 1,000 Units by mouth daily.   Yes [provider]  ferrous sulfate 325 (65 FE) MG tablet Take 325 mg by mouth daily with breakfast.   Yes [provider]  gabapentin (NEURONTIN) 300 MG capsule Take 300 mg by mouth 3 (three) times daily.   Yes [provider]  Multiple Vitamin (MULTIVITAMIN WITH MINERALS) TABS tablet Take 1 tablet by mouth daily.   Yes [provider]  nystatin ointment (MYCOSTATIN) Apply 1 application topically 2 (two) times daily as needed. 01/26/18  Yes [provider]  pravastatin (PRAVACHOL) 20 MG tablet Take 20 mg by mouth daily.   Yes [provider]  venlafaxine (EFFEXOR) 37.5 MG tablet Take 37.5 mg by mouth daily.   Yes [provider]     Family History  Problem Relation Age of Onset  . Cancer Mother   . Hypertension Father   . Arthritis Father     Social History   Socioeconomic History  . Marital status: Married    Spouse name: Not on file  . Number of children: Not on file  . Years of education: Not on file  . Highest education level: Not on file  Occupational History  . Not on file  Social Needs  . Financial resource strain: Not on file  . Food insecurity:    Worry: Not on file  Inability: Not on file  . Transportation needs:    Medical: Not on file    Non-medical: Not on file  Tobacco Use  . Smoking status: Former Smoker    Last attempt to quit: 05/23/1978    Years since quitting: 39.7  . Smokeless tobacco: Never Used  . Tobacco comment: only 1 pack pewr week   Substance and Sexual Activity  . Alcohol use: Yes    Comment: Occasional  . Drug use: Never  . Sexual activity: Not on file  Lifestyle  . Physical activity:    Days per week: Not on file    Minutes per session: Not on file  .  Stress: Not on file  Relationships  . Social connections:    Talks on phone: Not on file    Gets together: Not on file    Attends religious service: Not on file    Active member of club or organization: Not on file    Attends meetings of clubs or organizations: Not on file    Relationship status: Not on file  Other Topics Concern  . Not on file  Social History Narrative  . Not on file     Review of Systems: A 12 point ROS discussed and pertinent positives are indicated in the HPI above.  All other systems are negative.  Review of Systems  Constitutional: Negative for chills and fever.  Respiratory: Negative for cough and shortness of breath.   Cardiovascular: Negative for chest pain.  Gastrointestinal: Positive for constipation. Negative for abdominal pain, diarrhea, nausea and vomiting.  Skin: Negative for rash.  Neurological: Negative for syncope and light-headedness.  Psychiatric/Behavioral: Negative for confusion.    Vital Signs: BP 103/63 (BP Location: Left Arm)   Pulse 74   Temp 97.7 F (36.5 C) (Oral)   Resp 16   Ht 5\' 6"  (1.676 m)   Wt 274 lb 0.5 oz (124.3 kg)   SpO2 97%   BMI 44.23 kg/m   Physical Exam  Constitutional: She is oriented to person, place, and time. No distress.  Daughter present during exam  HENT:  Head: Normocephalic.  Cardiovascular: Normal rate and regular rhythm.  Murmur heard. Pulmonary/Chest: Effort normal and breath sounds normal.  On O2 via [place]  Abdominal: Soft. There is no tenderness.  Neurological: She is alert and oriented to person, place, and time.  Skin: Skin is warm and dry. She is not diaphoretic.  Psychiatric: She has a normal mood and affect. Her behavior is normal. Judgment and thought content normal.  Nursing note and vitals reviewed.    ASA 3 Mallampati 2  Imaging: Dg Chest 2 View  Result Date: 02/06/2018 CLINICAL DATA:  Three days of epigastric pain which worsened this morning. History of CHF, former smoker.  EXAM: CHEST - 2 VIEW COMPARISON:  None. FINDINGS: The lungs are mildly hypoinflated. The lung markings are coarse in the retrocardiac region and are accentuated by hypoinflation. The cardiac silhouette is enlarged. The pulmonary vascularity is normal. There is calcification in the wall of the aortic arch. The bony thorax exhibits no acute abnormality. The bowel gas pattern in the upper abdomen is unremarkable. IMPRESSION: Cardiomegaly without pulmonary vascular congestion or pulmonary edema. Coarse lung markings in the retrocardiac region may reflect atelectasis or early pneumonia though I favor atelectasis. Thoracic aortic atherosclerosis. Electronically Signed   By: David  [place] M.D.   On: 02/06/2018 14:14   Mr 3d Recon At Scanner  Result Date: 02/06/2018 CLINICAL DATA:  Cholelithiasis. Epigastric pain  for 3 days with nausea. History CHF. Prior "tummy tuck". EXAM: MRI ABDOMEN WITHOUT AND WITH CONTRAST (INCLUDING MRCP) TECHNIQUE: Multiplanar multisequence MR imaging of the abdomen was performed both before and after the administration of intravenous contrast. Heavily T2-weighted images of the biliary and pancreatic ducts were obtained, and three-dimensional MRCP images were rendered by post processing. CONTRAST:  10 cc Gadavist COMPARISON:  Ultrasound earlier today. FINDINGS: Mild to moderate degradation throughout secondary to patient body habitus and motion. Lower chest: Mild cardiomegaly, without pericardial or pleural effusion. Hepatobiliary: High left hepatic lobe cyst. Gallstones including at 3.0 cm. Gallbladder wall thickening, including at 10 mm on image 27/5. Mild intrahepatic biliary duct dilatation. The common duct measures 1.6 cm in the porta hepatis. 7 mm focus of T2 hypointensity in the distal common duct on images 15/3. This is felt to be confirmed on motion degraded dedicated MRCP image 92/9. Pancreas:  Normal, without mass or ductal dilatation. Spleen:  Normal in size, without focal  abnormality. Adrenals/Urinary Tract: Normal adrenal glands. An upper pole left renal 2.0 cm lesion medially is consistent with a hemorrhagic/proteinaceous cyst. A lower pole right renal lesion measures 1.3 cm on image 10/3 and is not well evaluated on post-contrast images, including image 64/5007. Stomach/Bowel: Normal stomach and abdominal bowel loops. Vascular/Lymphatic: Normal caliber of the aorta and branch vessels. No abdominal adenopathy. Other:  No ascites. Musculoskeletal: Convex left lumbar spine curvature. IMPRESSION: 1. Mild to moderate motion degradation. 2. Cholelithiasis with gallbladder wall thickening, again suspicious for acute cholecystitis. 3. Biliary duct dilatation with a subtle 7 mm distal common duct stone. 4. Lower pole right renal lesion which is suboptimally evaluated, but most likely represents a complex cyst. Consider renal ultrasound follow-up at 1 year. 5. Left renal hemorrhagic/proteinaceous cyst. Electronically Signed   By: Jeronimo Greaves M.D.   On: 02/06/2018 18:15   US Abdomen Limited  Result Date: 02/06/2018 CLINICAL DATA:  Acute right upper quadrant abdominal pain. EXAM: ULTRASOUND ABDOMEN LIMITED RIGHT UPPER QUADRANT COMPARISON:  None. FINDINGS: Gallbladder: Multiple gallstones are noted with the largest measuring 2.5 cm. Gallbladder wall thickening is noted at 7 mm. Positive sonographic Murphy's sign is noted. Sludge is noted within gallbladder lumen. Common bile duct: Diameter: 11 mm which is dilated and concerning for distal common bile duct obstruction. Liver: No focal lesion identified. Within normal limits in parenchymal echogenicity. Portal vein is patent on color Doppler imaging with normal direction of blood flow towards the liver. IMPRESSION: Cholelithiasis is noted with moderate gallbladder wall thickening and positive sonographic Murphy's sign concerning for acute cholecystitis. Common bile duct dilatation is noted concerning for distal common bile duct obstruction.  Correlation with liver function tests and MRCP is recommended. Electronically Signed   By: Lupita Raider, M.D.   On: 02/06/2018 13:55   Dg Ercp Biliary & Pancreatic Ducts  Result Date: 02/08/2018 CLINICAL DATA:  Choledocholithiasis EXAM: ERCP TECHNIQUE: Multiple spot images obtained with the fluoroscopic device and submitted for interpretation post-procedure. FLUOROSCOPY TIME:  Fluoroscopy Time:  2 minutes and 34 seconds Radiation Exposure Index (if provided by the fluoroscopic device): Number of Acquired Spot Images: 0 COMPARISON:  None. FINDINGS: Several images demonstrate cannulation of the common bile duct and contrast filling the biliary tree. Balloon stone retrieval is documented. IMPRESSION: See above. These images were submitted for radiologic interpretation only. Please see the procedural report for the amount of contrast and the fluoroscopy time utilized. Electronically Signed   By: Jolaine Click M.D.   On: 02/08/2018 15:59  Mr Abdomen Mrcp Vivien Rossetti Contast  Result Date: 02/06/2018 CLINICAL DATA:  Cholelithiasis. Epigastric pain for 3 days with nausea. History CHF. Prior "tummy tuck". EXAM: MRI ABDOMEN WITHOUT AND WITH CONTRAST (INCLUDING MRCP) TECHNIQUE: Multiplanar multisequence MR imaging of the abdomen was performed both before and after the administration of intravenous contrast. Heavily T2-weighted images of the biliary and pancreatic ducts were obtained, and three-dimensional MRCP images were rendered by post processing. CONTRAST:  10 cc Gadavist COMPARISON:  Ultrasound earlier today. FINDINGS: Mild to moderate degradation throughout secondary to patient body habitus and motion. Lower chest: Mild cardiomegaly, without pericardial or pleural effusion. Hepatobiliary: High left hepatic lobe cyst. Gallstones including at 3.0 cm. Gallbladder wall thickening, including at 10 mm on image 27/5. Mild intrahepatic biliary duct dilatation. The common duct measures 1.6 cm in the porta hepatis. 7 mm focus  of T2 hypointensity in the distal common duct on images 15/3. This is felt to be confirmed on motion degraded dedicated MRCP image 92/9. Pancreas:  Normal, without mass or ductal dilatation. Spleen:  Normal in size, without focal abnormality. Adrenals/Urinary Tract: Normal adrenal glands. An upper pole left renal 2.0 cm lesion medially is consistent with a hemorrhagic/proteinaceous cyst. A lower pole right renal lesion measures 1.3 cm on image 10/3 and is not well evaluated on post-contrast images, including image 64/5007. Stomach/Bowel: Normal stomach and abdominal bowel loops. Vascular/Lymphatic: Normal caliber of the aorta and branch vessels. No abdominal adenopathy. Other:  No ascites. Musculoskeletal: Convex left lumbar spine curvature. IMPRESSION: 1. Mild to moderate motion degradation. 2. Cholelithiasis with gallbladder wall thickening, again suspicious for acute cholecystitis. 3. Biliary duct dilatation with a subtle 7 mm distal common duct stone. 4. Lower pole right renal lesion which is suboptimally evaluated, but most likely represents a complex cyst. Consider renal ultrasound follow-up at 1 year. 5. Left renal hemorrhagic/proteinaceous cyst. Electronically Signed   By: Jeronimo Greaves M.D.   On: 02/06/2018 18:15    Labs:  CBC: Recent Labs    02/06/18 1251 02/07/18 0513 02/09/18 0111  WBC 9.2 7.7 17.7*  HGB 11.7* 10.7* 10.7*  HCT 38.5 34.5* 35.2*  PLT 255 244 249    COAGS: Recent Labs    02/07/18 1705  INR 1.07    BMP: Recent Labs    02/06/18 1251 02/07/18 0513 02/08/18 0222 02/09/18 0111  NA 139 141 140 137  K 3.8 3.9 3.9 3.8  CL 105 105 102 101  CO2 26 30 31 29   GLUCOSE 108* 101* 103* 135*  BUN 18 13 8 14   CALCIUM 9.2 8.8* 9.0 8.3*  CREATININE 0.60 0.62 0.68 1.13*  GFRNONAA >60 >60 >60 46*  GFRAA >60 >60 >60 53*    LIVER FUNCTION TESTS: Recent Labs    02/06/18 1251 02/07/18 0513 02/08/18 0222  BILITOT 0.6 0.6 0.5  AST 17 15 18   ALT 12 11 11   ALKPHOS 69 62  63  PROT 6.8 6.3* 5.9*  ALBUMIN 3.6 3.3* 3.0*    TUMOR MARKERS: No results for input(s): AFPTM, CEA, CA199, CHROMGRNA in the last 8760 hours.  Assessment and Plan:  Choledocholithiasis/cholecystitis s/p ERCP on 10/11 - choledocholithiasis was removed and biliary sphincterectomy was performed. Patient with previous cholecystostomy about a year ago for same which remained for about 6 weeks - patient denies any recurrence of s/s until now. Patient lab work has been unremarkable thus far however she now has increase in WBC to 17.7 from 7.7 yesterday and Tmax overnight 100.65F. She continues on IV Zosyn, blood  cultures are pending. Additionally creatinine increased on today's lab work to 1.13 from previous 0.68; AST/ALT/ALP/t.bili within normal limits.   Request to IR for percutaneous cholecystostomy - discussed patient with Dr. Lowella Dandy who is agreeable to proceed today. Procedure discussed with patient is also agreeable to proceed.  Risks and benefits discussed with the patient including, but not limited to bleeding, infection, gallbladder perforation, bile leak, sepsis or even death.  All of the patient's questions were answered, patient is agreeable to proceed.  Consent signed and in chart.  Thank you for this interesting consult.  I greatly enjoyed meeting Doyle Tegethoff and look forward to participating in their care.  A copy of this report was sent to the requesting provider on this date.  Electronically Signed: Villa Herb, PA-C 02/09/2018, 10:41 AM   I spent a total of 40 Minutes  in face to face in clinical consultation, greater than 50% of which was counseling/coordinating care for cholecystostomy.

## 2018-02-09 NOTE — Sedation Documentation (Signed)
Transfer of procedure to CT department.

## 2018-02-09 NOTE — Progress Notes (Addendum)
Daily Rounding Note  02/09/2018, 9:08 AM  LOS: 2 days   SUBJECTIVE:   Chief complaint: Choledocholithiasis.    No nausea.  No abdominal pain.  She feels pretty well.  Her breathing is at baseline, does not feel short of breath.  No palpitations or chest pain. Spent a few days since she had a bowel movement.  At home she uses MiraLAX perhaps once a week as needed.  OBJECTIVE:         Vital signs in last 24 hours:    Temp:  [97.7 F (36.5 C)-100.9 F (38.3 C)] 97.7 F (36.5 C) (10/12 0526) Pulse Rate:  [68-97] 74 (10/12 0526) Resp:  [16-33] 16 (10/12 0526) BP: (92-164)/(58-95) 103/63 (10/12 0526) SpO2:  [77 %-100 %] 97 % (10/12 0526) Arterial Line BP: (122-131)/(49-53) 122/49 (10/11 1551) Last BM Date: 02/06/18 Filed Weights   02/07/18 0500 02/07/18 1513 02/08/18 0500  Weight: 122.3 kg 125.3 kg 124.3 kg   General: Obese, alert, comfortable.  Does not look acutely ill or toxic. Heart: RRR.  Harsh systolic murmur. Chest: Clear bilaterally in front.  No labored breathing or cough. Abdomen: Soft.  Not tender or distended.  Bowel sounds hypoactive. Extremities: Lower extremity swelling, not pitting. Neuro/Psych: Pleasant, cooperative, calm.  Moves all 4 limbs.  No tremors.  No gross neurologic deficits.  Intake/Output from previous day: 10/11 0701 - 10/12 0700 In: 1630.1 [P.O.:300; I.V.:1176.1; IV Piggyback:154] Out: 1100 [Urine:1100]  Intake/Output this shift: No intake/output data recorded.  Lab Results: Recent Labs    02/06/18 1251 02/07/18 0513 02/09/18 0111  WBC 9.2 7.7 17.7*  HGB 11.7* 10.7* 10.7*  HCT 38.5 34.5* 35.2*  PLT 255 244 249   BMET Recent Labs    02/07/18 0513 02/08/18 0222 02/09/18 0111  NA 141 140 137  K 3.9 3.9 3.8  CL 105 102 101  CO2 30 31 29   GLUCOSE 101* 103* 135*  BUN 13 8 14   CREATININE 0.62 0.68 1.13*  CALCIUM 8.8* 9.0 8.3*   LFT Recent Labs    02/06/18 1251  02/07/18 0513 02/08/18 0222  PROT 6.8 6.3* 5.9*  ALBUMIN 3.6 3.3* 3.0*  AST 17 15 18   ALT 12 11 11   ALKPHOS 69 62 63  BILITOT 0.6 0.6 0.5   PT/INR Recent Labs    02/07/18 1705  LABPROT 13.8  INR 1.07   Hepatitis Panel No results for input(s): HEPBSAG, HCVAB, HEPAIGM, HEPBIGM in the last 72 hours.  Studies/Results: Dg Ercp Biliary & Pancreatic Ducts  Result Date: 02/08/2018 CLINICAL DATA:  Choledocholithiasis EXAM: ERCP TECHNIQUE: Multiple spot images obtained with the fluoroscopic device and submitted for interpretation post-procedure. FLUOROSCOPY TIME:  Fluoroscopy Time:  2 minutes and 34 seconds Radiation Exposure Index (if provided by the fluoroscopic device): Number of Acquired Spot Images: 0 COMPARISON:  None. FINDINGS: Several images demonstrate cannulation of the common bile duct and contrast filling the biliary tree. Balloon stone retrieval is documented. IMPRESSION: See above. These images were submitted for radiologic interpretation only. Please see the procedural report for the amount of contrast and the fluoroscopy time utilized. Electronically Signed   By: Jolaine Click M.D.   On: 02/08/2018 15:59   Scheduled Meds: . aspirin EC  81 mg Oral Daily  . calcium carbonate  1,250 mg Oral Daily  . cholecalciferol  1,000 Units Oral Daily  . gabapentin  300 mg Oral TID  . heparin injection (subcutaneous)  5,000 Units Subcutaneous Q8H  .  indomethacin  100 mg Rectal Once  . mouth rinse  15 mL Mouth Rinse BID  . multivitamin with minerals  1 tablet Oral Daily  . pantoprazole  40 mg Oral Daily  . pravastatin  20 mg Oral q1800  . sodium chloride flush  3 mL Intravenous Q12H  . venlafaxine  37.5 mg Oral Daily  . vitamin C  250 mg Oral Daily   Continuous Infusions: . sodium chloride Stopped (02/08/18 1149)  . sodium chloride    . dextrose 5 % and 0.9% NaCl Stopped (02/08/18 1758)  . piperacillin-tazobactam 3.375 g (02/09/18 0211)   PRN Meds:.sodium chloride, sodium chloride,  acetaminophen **OR** acetaminophen, hydrALAZINE, HYDROmorphone (DILAUDID) injection, ondansetron (ZOFRAN) IV, sodium chloride flush  ASSESMENT:   *   Choledocholithiasis. 02/08/2018 EUS:   Dilated CBD and common hepatic duct.  CBD stone.  GB stone, GB wall thickening concerning for cholecystitis.  Deformity in the first duodenum 02/08/2018 ERCP:   Dilation of main bile duct and right/left hepatic ducts, intrahepatic branches.  CBD stone removed with balloon sweep following biliary sphincterotomy.  Cystic duct did not opacify with occlusion cholangiogram.  *    Cholelithiasis.  ? cholecystitis. Percutaneous cholecystostomy tube for several weeks between December 2018-January 2019.  Note elevation of WBC count 7.7 >> 17.7 in 3 days. Fever overnight to 100.29F.  Blood clxs obtained.     *     Severe aortic stenosis.  Did well with general anesthesia for ERCP.    *    Normocytic anemia  *     AKI, mild.  PLAN   *    General surgery is following.  Dr. Daphine Deutscher says today "LIkely patient will need percutaneous drainage of gallbladder at some point."  *   Given the fever and WBCs, leave the Zosyn in place.    *     Advance from full liquids to heart healthy as tolerated.     Jennye Moccasin  02/09/2018, 9:08 AM Phone [phone]

## 2018-02-09 NOTE — Progress Notes (Addendum)
Paged provider - notification; central tele noted junctional beats, HR dropped into 40's momentarily, back into 70's.

## 2018-02-09 NOTE — Procedures (Signed)
  Pre-operative Diagnosis: Choledocholithiasis and cholecystitis with gallstones.       Post-operative Diagnosis: Cholecystitis with purulent fluid in gallbaldder   Indications: Elevated WBC and concern for cholecystitis  Procedure: Image guided percutaneous cholecystostomy  Findings: Inflammatory changes around gallbladder with large stone, no distention.  10 Fr drain placed with difficulty due to large stone.  5 ml of brown purulent fluid removed from gallbladder  Complications: None     EBL: Minimal  Plan: Send fluid for culture.  Drain to gravity bag.

## 2018-02-09 NOTE — Plan of Care (Signed)
  Problem: Education: Goal: Knowledge of General Education information will improve Description: Including pain rating scale, medication(s)/side effects and non-pharmacologic comfort measures Outcome: Progressing   Problem: Coping: Goal: Level of anxiety will decrease Outcome: Progressing   Problem: Pain Managment: Goal: General experience of comfort will improve Outcome: Progressing   

## 2018-02-10 LAB — CBC
HCT: 33.8 % — ABNORMAL LOW (ref 36.0–46.0)
Hemoglobin: 10.1 g/dL — ABNORMAL LOW (ref 12.0–15.0)
MCH: 29.4 pg (ref 26.0–34.0)
MCHC: 29.9 g/dL — ABNORMAL LOW (ref 30.0–36.0)
MCV: 98.5 fL (ref 80.0–100.0)
Platelets: 221 10*3/uL (ref 150–400)
RBC: 3.43 MIL/uL — ABNORMAL LOW (ref 3.87–5.11)
RDW: 14.2 % (ref 11.5–15.5)
WBC: 11.3 10*3/uL — ABNORMAL HIGH (ref 4.0–10.5)
nRBC: 0 % (ref 0.0–0.2)

## 2018-02-10 LAB — COMPREHENSIVE METABOLIC PANEL
ALT: 17 U/L (ref 0–44)
AST: 16 U/L (ref 15–41)
Albumin: 2.6 g/dL — ABNORMAL LOW (ref 3.5–5.0)
Alkaline Phosphatase: 79 U/L (ref 38–126)
Anion gap: 7 (ref 5–15)
BUN: 15 mg/dL (ref 8–23)
CO2: 29 mmol/L (ref 22–32)
Calcium: 8.5 mg/dL — ABNORMAL LOW (ref 8.9–10.3)
Chloride: 100 mmol/L (ref 98–111)
Creatinine, Ser: 0.86 mg/dL (ref 0.44–1.00)
GFR calc Af Amer: >60 mL/min (ref >60)
GFR calc non Af Amer: >60 mL/min (ref >60)
Glucose, Bld: 103 mg/dL — ABNORMAL HIGH (ref 70–99)
Potassium: 3.6 mmol/L (ref 3.5–5.1)
Sodium: 136 mmol/L (ref 135–145)
Total Bilirubin: 0.7 mg/dL (ref 0.3–1.2)
Total Protein: 6.1 g/dL — ABNORMAL LOW (ref 6.5–8.1)

## 2018-02-10 MED ORDER — POLYETHYLENE GLYCOL 3350 17 G PO PACK
17.0000 g | PACK | Freq: Every day | ORAL | Status: DC
  Administered 2018-02-10 – 2018-02-13 (×4): 17 g via ORAL
  Filled 2018-02-10 (×4): qty 1

## 2018-02-10 MED ORDER — SODIUM CHLORIDE 0.9 % IV SOLN
2.0000 g | INTRAVENOUS | Status: DC
  Administered 2018-02-10 – 2018-02-11 (×2): 2 g via INTRAVENOUS
  Filled 2018-02-10 (×3): qty 20

## 2018-02-10 NOTE — Progress Notes (Signed)
Patient ID: Dawn Gonzales, female   DOB: July 18, 1940, 77 y.o.   MRN: [id number] [facility] Surgery Progress Note:   2 Days Post-Op  Subjective: Mental status is alert;  No major complaint changes Objective: Vital signs in last 24 hours: Temp:  [98.5 F (36.9 C)-100.3 F (37.9 C)] 98.7 F (37.1 C) (10/13 0409) Pulse Rate:  [74-81] 74 (10/13 0409) Resp:  [16-24] 16 (10/13 0409) BP: (97-122)/(60-71) 109/65 (10/13 0409) SpO2:  [94 %-100 %] 96 % (10/13 0409) Weight:  [127.4 kg] 127.4 kg (10/13 0500)  Intake/Output from previous day: 10/12 0701 - 10/13 0700 In: 515 [I.V.:338.5; IV Piggyback:161.5] Out: 106 [Drains:106] Intake/Output this shift: No intake/output data recorded.  Physical Exam: Work of breathing is not labored at rest;  Percutaneous GB drain in place  Lab Results:  Results for orders placed or performed during the hospital encounter of 02/06/18 (from the past 48 hour(s))  Glucose, capillary     Status: None   Collection Time: 02/08/18 10:22 AM  Result Value Ref Range   Glucose-Capillary 92 70 - 99 mg/dL   Comment 1 Notify RN   Culture, blood (routine x 2)     Status: None (Preliminary result)   Collection Time: 02/08/18  4:35 PM  Result Value Ref Range   Specimen Description BLOOD BLOOD LEFT HAND    Special Requests      BOTTLES DRAWN AEROBIC AND ANAEROBIC Blood Culture results may not be optimal due to an inadequate volume of blood received in culture bottles   Culture      NO GROWTH 2 DAYS Performed at [facility] [address], [place], [place] [address]    Report Status PENDING   Culture, blood (routine x 2)     Status: None (Preliminary result)   Collection Time: 02/08/18  4:55 PM  Result Value Ref Range   Specimen Description BLOOD BLOOD RIGHT HAND    Special Requests      BOTTLES DRAWN AEROBIC AND ANAEROBIC Blood Culture adequate volume   Culture      NO GROWTH 2 DAYS Performed at [facility], [place] [address] [place],  [place] [address]    Report Status PENDING   CBC with Differential/Platelet     Status: Abnormal   Collection Time: 02/09/18  1:11 AM  Result Value Ref Range   WBC 17.7 (H) 4.0 - 10.5 K/uL   RBC 3.60 (L) 3.87 - 5.11 MIL/uL   Hemoglobin 10.7 (L) 12.0 - 15.0 g/dL   HCT 35.2 (L) 36.0 - 46.0 %   MCV 97.8 80.0 - 100.0 fL   MCH 29.7 26.0 - 34.0 pg   MCHC 30.4 30.0 - 36.0 g/dL   RDW 14.5 11.5 - 15.5 %   Platelets 249 150 - 400 K/uL   nRBC 0.0 0.0 - 0.2 %   Neutrophils Relative % 89 %   Neutro Abs 15.8 (H) 1.7 - 7.7 K/uL   Lymphocytes Relative 6 %   Lymphs Abs 1.0 0.7 - 4.0 K/uL   Monocytes Relative 4 %   Monocytes Absolute 0.8 0.1 - 1.0 K/uL   Eosinophils Relative 0 %   Eosinophils Absolute 0.0 0.0 - 0.5 K/uL   Basophils Relative 0 %   Basophils Absolute 0.0 0.0 - 0.1 K/uL   Immature Granulocytes 1 %   Abs Immature Granulocytes 0.09 (H) 0.00 - 0.07 K/uL    Comment: Performed at [facility] [address], [place], [place] [address]  Basic metabolic  panel     Status: Abnormal   Collection Time: 02/09/18  1:11 AM  Result Value Ref Range   Sodium 137 135 - 145 mmol/L   Potassium 3.8 3.5 - 5.1 mmol/L   Chloride 101 98 - 111 mmol/L   CO2 29 22 - 32 mmol/L   Glucose, Bld 135 (H) 70 - 99 mg/dL   BUN 14 8 - 23 mg/dL   Creatinine, Ser 1.13 (H) 0.44 - 1.00 mg/dL   Calcium 8.3 (L) 8.9 - 10.3 mg/dL   GFR calc non Af Amer 46 (L) >60 mL/min   GFR calc Af Amer 53 (L) >60 mL/min    Comment: (NOTE) The eGFR has been calculated using the CKD EPI equation. This calculation has not been validated in all clinical situations. eGFR's persistently <60 mL/min signify possible Chronic Kidney Disease.    Anion gap 7 5 - 15    Comment: Performed at [facility] [address], [place], [place] [address]  Aerobic/Anaerobic Culture (surgical/deep wound)     Status: None (Preliminary result)   Collection Time: 02/09/18 12:05 PM  Result Value Ref Range   Specimen Description BILE    Special  Requests Normal    Gram Stain      RARE WBC PRESENT, PREDOMINANTLY PMN RARE GRAM POSITIVE COCCI Performed at [facility], [place] [address] [place], [place] [address]    Culture PENDING    Report Status PENDING     Radiology/Results: Ir Perc Cholecystostomy  Result Date: 02/09/2018 INDICATION: 77 year old with choledocholithiasis, cholelithiasis and concern for cholecystitis. Prior cholecystostomy tube. EXAM: IMAGE GUIDED PERCUTANEOUS CHOLECYSTOSTOMY TUBE PLACEMENT MEDICATIONS: Inpatient on antibiotics ANESTHESIA/SEDATION: Moderate (conscious) sedation was employed during this procedure. A total of Versed 1 mg and Fentanyl 50 mcg was administered intravenously. Moderate Sedation Time: 22 minutes. The patient's level of consciousness and vital signs were monitored continuously by radiology nursing throughout the procedure under my direct supervision. FLUOROSCOPY TIME:  None COMPLICATIONS: None immediate. PROCEDURE: Informed written consent was obtained from the patient after a thorough discussion of the procedural risks, benefits and alternatives. All questions were addressed. A timeout was performed prior to the initiation of the procedure. Patient was placed supine on the CT scanner. Images through the upper abdomen were obtained. Abdomen was also evaluated with ultrasound. Abnormal gallbladder was identified in the anterior right upper abdomen. The right upper abdomen was prepped and draped in sterile fashion. Maximal barrier sterile technique was utilized including caps, mask, sterile gowns, sterile gloves, sterile drape, hand hygiene and skin antiseptic. Skin was anesthetized with 1% lidocaine. 18 gauge trocar needle was directed into the abnormal gallbladder using ultrasound and CT guidance. Needle placement was difficult due to large gallstone. Eventually, wire was confirmed within the gallbladder. The tract was dilated to accommodate a 10.2 [place] multipurpose drain. Approximately 5 mL of  brown purulent looking fluid was aspirated. CT images confirmed drain placement in the gallbladder. The catheter was sutured to skin and attached to a gravity bag. FINDINGS: Stone filled gallbladder with surrounding inflammatory changes. Drain was successfully placed within this abnormal gallbladder. 5 mL of dark purulent looking fluid was removed and sent for culture. IMPRESSION: Successful image guided percutaneous cholecystostomy tube placement. Gallbladder is nondistended and stone filled. Small amount of fluid was removed from the gallbladder and sent for culture. Electronically Signed   By: Markus Daft M.D.   On: 02/09/2018 13:36   Ct Abd Limited W/o Cm  Result Date: 02/09/2018 INDICATION: 77 year old with choledocholithiasis,  cholelithiasis and concern for cholecystitis. Prior cholecystostomy tube. EXAM: IMAGE GUIDED PERCUTANEOUS CHOLECYSTOSTOMY TUBE PLACEMENT MEDICATIONS: Inpatient on antibiotics ANESTHESIA/SEDATION: Moderate (conscious) sedation was employed during this procedure. A total of Versed 1 mg and Fentanyl 50 mcg was administered intravenously. Moderate Sedation Time: 22 minutes. The patient's level of consciousness and vital signs were monitored continuously by radiology nursing throughout the procedure under my direct supervision. FLUOROSCOPY TIME:  None COMPLICATIONS: None immediate. PROCEDURE: Informed written consent was obtained from the patient after a thorough discussion of the procedural risks, benefits and alternatives. All questions were addressed. A timeout was performed prior to the initiation of the procedure. Patient was placed supine on the CT scanner. Images through the upper abdomen were obtained. Abdomen was also evaluated with ultrasound. Abnormal gallbladder was identified in the anterior right upper abdomen. The right upper abdomen was prepped and draped in sterile fashion. Maximal barrier sterile technique was utilized including caps, mask, sterile gowns, sterile gloves,  sterile drape, hand hygiene and skin antiseptic. Skin was anesthetized with 1% lidocaine. 18 gauge trocar needle was directed into the abnormal gallbladder using ultrasound and CT guidance. Needle placement was difficult due to large gallstone. Eventually, wire was confirmed within the gallbladder. The tract was dilated to accommodate a 10.2 [place] multipurpose drain. Approximately 5 mL of brown purulent looking fluid was aspirated. CT images confirmed drain placement in the gallbladder. The catheter was sutured to skin and attached to a gravity bag. FINDINGS: Stone filled gallbladder with surrounding inflammatory changes. Drain was successfully placed within this abnormal gallbladder. 5 mL of dark purulent looking fluid was removed and sent for culture. IMPRESSION: Successful image guided percutaneous cholecystostomy tube placement. Gallbladder is nondistended and stone filled. Small amount of fluid was removed from the gallbladder and sent for culture. Electronically Signed   By: Markus Daft M.D.   On: 02/09/2018 13:36   Dg Ercp Biliary & Pancreatic Ducts  Result Date: 02/08/2018 CLINICAL DATA:  Choledocholithiasis EXAM: ERCP TECHNIQUE: Multiple spot images obtained with the fluoroscopic device and submitted for interpretation post-procedure. FLUOROSCOPY TIME:  Fluoroscopy Time:  2 minutes and 34 seconds Radiation Exposure Index (if provided by the fluoroscopic device): Number of Acquired Spot Images: 0 COMPARISON:  None. FINDINGS: Several images demonstrate cannulation of the common bile duct and contrast filling the biliary tree. Balloon stone retrieval is documented. IMPRESSION: See above. These images were submitted for radiologic interpretation only. Please see the procedural report for the amount of contrast and the fluoroscopy time utilized. Electronically Signed   By: Marybelle Killings M.D.   On: 02/08/2018 15:59    Anti-infectives: Anti-infectives (From admission, onward)   Start     Dose/Rate Route  Frequency Ordered Stop   02/09/18 1400  piperacillin-tazobactam (ZOSYN) IVPB 3.375 g     3.375 g 12.5 mL/hr over 240 Minutes Intravenous Every 8 hours 02/09/18 1341     02/08/18 1800  piperacillin-tazobactam (ZOSYN) IVPB 3.375 g  Status:  Discontinued     3.375 g 12.5 mL/hr over 240 Minutes Intravenous Every 8 hours 02/08/18 1516 02/09/18 1341   02/07/18 1700  cefTRIAXone (ROCEPHIN) 2 g in sodium chloride 0.9 % 100 mL IVPB  Status:  Discontinued     2 g 200 mL/hr over 30 Minutes Intravenous Every 24 hours 02/07/18 1320 02/08/18 1516   02/07/18 1315  cefTRIAXone (ROCEPHIN) 2 g in sodium chloride 0.9 % 100 mL IVPB  Status:  Discontinued     2 g 200 mL/hr over 30 Minutes Intravenous Every 24 hours  02/07/18 1309 02/07/18 1320   02/06/18 1530  cefTRIAXone (ROCEPHIN) 2 g in sodium chloride 0.9 % 100 mL IVPB     2 g 200 mL/hr over 30 Minutes Intravenous  Once 02/06/18 1524 02/06/18 1919      Assessment/Plan: Problem List: Patient Active Problem List   Diagnosis Date Noted  . Junctional bradycardia   . Cholecystitis, acute with cholelithiasis 02/07/2018  . Calculus of gallbladder with biliary obstruction but without cholecystitis   . Preoperative cardiovascular examination   . Acute cholecystitis   . Severe aortic stenosis   . Choledocholithiasis   . N&V (nausea and vomiting) 02/06/2018  . Epigastric pain 02/06/2018    With two previous perc drains of the gallbladder, surgical intervention will be more complicated; require more anesthesia, and have greater risk of complications.  A lessor procedure with stone removal and partial cholecystectomy may be necessary prior to aortic valve intervention. 2 Days Post-Op    LOS: 3 days   Matt B. Hassell Done, MD, [facility] [phone] beeper [phone]  02/10/2018 9:20 AM

## 2018-02-10 NOTE — Progress Notes (Signed)
Referring Physician(s): Dr. Daphine Deutscher  Supervising Physician: Richarda Overlie  Patient Status:  [facility] - In-pt  Chief Complaint: Cholecystitis, choledocholithiasis  Subjective: Resting comfortably.  Pain improved.  "I've been through this before."  Allergies: Patient has no known allergies.  Medications: Prior to Admission medications   Medication Sig Start Date End Date Taking? Authorizing Provider  Ascorbic Acid (VITAMIN C) 100 MG tablet Take 100 mg by mouth daily.   Yes [provider]  aspirin EC 81 MG tablet Take 81 mg by mouth daily.   Yes [provider]  Calcium Carbonate (CALCIUM-CARB 600 PO) Take 600 mg by mouth daily.   Yes [provider]  cholecalciferol (VITAMIN D) 1000 units tablet Take 1,000 Units by mouth daily.   Yes [provider]  ferrous sulfate 325 (65 FE) MG tablet Take 325 mg by mouth daily with breakfast.   Yes [provider]  gabapentin (NEURONTIN) 300 MG capsule Take 300 mg by mouth 3 (three) times daily.   Yes [provider]  Multiple Vitamin (MULTIVITAMIN WITH MINERALS) TABS tablet Take 1 tablet by mouth daily.   Yes [provider]  nystatin ointment (MYCOSTATIN) Apply 1 application topically 2 (two) times daily as needed. 01/26/18  Yes [provider]  pravastatin (PRAVACHOL) 20 MG tablet Take 20 mg by mouth daily.   Yes [provider]  venlafaxine (EFFEXOR) 37.5 MG tablet Take 37.5 mg by mouth daily.   Yes [provider]     Vital Signs: BP 109/62 (BP Location: Left Arm)   Pulse 79   Temp 98.9 F (37.2 C) (Oral)   Resp 20   Ht 5\' 6"  (1.676 m)   Wt 280 lb 13.9 oz (127.4 kg)   SpO2 96%   BMI 45.33 kg/m   Physical Exam  NAD, alert Abdomen: soft, non-distended, non-tender.  RUQ drain in place.  Insertion site c/d/i, very small amount of old blood present. Sero-sanguinous output in collection bag.   Imaging: Mr 3d Recon At Scanner  Result Date:  02/06/2018 CLINICAL DATA:  Cholelithiasis. Epigastric pain for 3 days with nausea. History CHF. Prior "tummy tuck". EXAM: MRI ABDOMEN WITHOUT AND WITH CONTRAST (INCLUDING MRCP) TECHNIQUE: Multiplanar multisequence MR imaging of the abdomen was performed both before and after the administration of intravenous contrast. Heavily T2-weighted images of the biliary and pancreatic ducts were obtained, and three-dimensional MRCP images were rendered by post processing. CONTRAST:  10 cc Gadavist COMPARISON:  Ultrasound earlier today. FINDINGS: Mild to moderate degradation throughout secondary to patient body habitus and motion. Lower chest: Mild cardiomegaly, without pericardial or pleural effusion. Hepatobiliary: High left hepatic lobe cyst. Gallstones including at 3.0 cm. Gallbladder wall thickening, including at 10 mm on image 27/5. Mild intrahepatic biliary duct dilatation. The common duct measures 1.6 cm in the porta hepatis. 7 mm focus of T2 hypointensity in the distal common duct on images 15/3. This is felt to be confirmed on motion degraded dedicated MRCP image 92/9. Pancreas:  Normal, without mass or ductal dilatation. Spleen:  Normal in size, without focal abnormality. Adrenals/Urinary Tract: Normal adrenal glands. An upper pole left renal 2.0 cm lesion medially is consistent with a hemorrhagic/proteinaceous cyst. A lower pole right renal lesion measures 1.3 cm on image 10/3 and is not well evaluated on post-contrast images, including image 64/5007. Stomach/Bowel: Normal stomach and abdominal bowel loops. Vascular/Lymphatic: Normal caliber of the aorta and branch vessels. No abdominal adenopathy. Other:  No ascites. Musculoskeletal: Convex left lumbar spine curvature. IMPRESSION:  1. Mild to moderate motion degradation. 2. Cholelithiasis with gallbladder wall thickening, again suspicious for acute cholecystitis. 3. Biliary duct dilatation with a subtle 7 mm distal common duct stone. 4. Lower pole right renal lesion  which is suboptimally evaluated, but most likely represents a complex cyst. Consider renal ultrasound follow-up at 1 year. 5. Left renal hemorrhagic/proteinaceous cyst. Electronically Signed   By: Jeronimo Greaves M.D.   On: 02/06/2018 18:15   Ir Perc Cholecystostomy  Result Date: 02/09/2018 INDICATION: 77 year old with choledocholithiasis, cholelithiasis and concern for cholecystitis. Prior cholecystostomy tube. EXAM: IMAGE GUIDED PERCUTANEOUS CHOLECYSTOSTOMY TUBE PLACEMENT MEDICATIONS: Inpatient on antibiotics ANESTHESIA/SEDATION: Moderate (conscious) sedation was employed during this procedure. A total of Versed 1 mg and Fentanyl 50 mcg was administered intravenously. Moderate Sedation Time: 22 minutes. The patient's level of consciousness and vital signs were monitored continuously by radiology nursing throughout the procedure under my direct supervision. FLUOROSCOPY TIME:  None COMPLICATIONS: None immediate. PROCEDURE: Informed written consent was obtained from the patient after a thorough discussion of the procedural risks, benefits and alternatives. All questions were addressed. A timeout was performed prior to the initiation of the procedure. Patient was placed supine on the CT scanner. Images through the upper abdomen were obtained. Abdomen was also evaluated with ultrasound. Abnormal gallbladder was identified in the anterior right upper abdomen. The right upper abdomen was prepped and draped in sterile fashion. Maximal barrier sterile technique was utilized including caps, mask, sterile gowns, sterile gloves, sterile drape, hand hygiene and skin antiseptic. Skin was anesthetized with 1% lidocaine. 18 gauge trocar needle was directed into the abnormal gallbladder using ultrasound and CT guidance. Needle placement was difficult due to large gallstone. Eventually, wire was confirmed within the gallbladder. The tract was dilated to accommodate a 10.2 [place] multipurpose drain. Approximately 5 mL of brown  purulent looking fluid was aspirated. CT images confirmed drain placement in the gallbladder. The catheter was sutured to skin and attached to a gravity bag. FINDINGS: Stone filled gallbladder with surrounding inflammatory changes. Drain was successfully placed within this abnormal gallbladder. 5 mL of dark purulent looking fluid was removed and sent for culture. IMPRESSION: Successful image guided percutaneous cholecystostomy tube placement. Gallbladder is nondistended and stone filled. Small amount of fluid was removed from the gallbladder and sent for culture. Electronically Signed   By: Richarda Overlie M.D.   On: 02/09/2018 13:36   Ct Abd Limited W/o Cm  Result Date: 02/09/2018 INDICATION: 77 year old with choledocholithiasis, cholelithiasis and concern for cholecystitis. Prior cholecystostomy tube. EXAM: IMAGE GUIDED PERCUTANEOUS CHOLECYSTOSTOMY TUBE PLACEMENT MEDICATIONS: Inpatient on antibiotics ANESTHESIA/SEDATION: Moderate (conscious) sedation was employed during this procedure. A total of Versed 1 mg and Fentanyl 50 mcg was administered intravenously. Moderate Sedation Time: 22 minutes. The patient's level of consciousness and vital signs were monitored continuously by radiology nursing throughout the procedure under my direct supervision. FLUOROSCOPY TIME:  None COMPLICATIONS: None immediate. PROCEDURE: Informed written consent was obtained from the patient after a thorough discussion of the procedural risks, benefits and alternatives. All questions were addressed. A timeout was performed prior to the initiation of the procedure. Patient was placed supine on the CT scanner. Images through the upper abdomen were obtained. Abdomen was also evaluated with ultrasound. Abnormal gallbladder was identified in the anterior right upper abdomen. The right upper abdomen was prepped and draped in sterile fashion. Maximal barrier sterile technique was utilized including caps, mask, sterile gowns, sterile gloves,  sterile drape, hand hygiene and skin antiseptic. Skin was anesthetized with 1% lidocaine. 18  gauge trocar needle was directed into the abnormal gallbladder using ultrasound and CT guidance. Needle placement was difficult due to large gallstone. Eventually, wire was confirmed within the gallbladder. The tract was dilated to accommodate a 10.2 [place] multipurpose drain. Approximately 5 mL of brown purulent looking fluid was aspirated. CT images confirmed drain placement in the gallbladder. The catheter was sutured to skin and attached to a gravity bag. FINDINGS: Stone filled gallbladder with surrounding inflammatory changes. Drain was successfully placed within this abnormal gallbladder. 5 mL of dark purulent looking fluid was removed and sent for culture. IMPRESSION: Successful image guided percutaneous cholecystostomy tube placement. Gallbladder is nondistended and stone filled. Small amount of fluid was removed from the gallbladder and sent for culture. Electronically Signed   By: Richarda Overlie M.D.   On: 02/09/2018 13:36   Dg Ercp Biliary & Pancreatic Ducts  Result Date: 02/08/2018 CLINICAL DATA:  Choledocholithiasis EXAM: ERCP TECHNIQUE: Multiple spot images obtained with the fluoroscopic device and submitted for interpretation post-procedure. FLUOROSCOPY TIME:  Fluoroscopy Time:  2 minutes and 34 seconds Radiation Exposure Index (if provided by the fluoroscopic device): Number of Acquired Spot Images: 0 COMPARISON:  None. FINDINGS: Several images demonstrate cannulation of the common bile duct and contrast filling the biliary tree. Balloon stone retrieval is documented. IMPRESSION: See above. These images were submitted for radiologic interpretation only. Please see the procedural report for the amount of contrast and the fluoroscopy time utilized. Electronically Signed   By: Jolaine Click M.D.   On: 02/08/2018 15:59   Mr Abdomen Mrcp Vivien Rossetti Contast  Result Date: 02/06/2018 CLINICAL DATA:  Cholelithiasis.  Epigastric pain for 3 days with nausea. History CHF. Prior "tummy tuck". EXAM: MRI ABDOMEN WITHOUT AND WITH CONTRAST (INCLUDING MRCP) TECHNIQUE: Multiplanar multisequence MR imaging of the abdomen was performed both before and after the administration of intravenous contrast. Heavily T2-weighted images of the biliary and pancreatic ducts were obtained, and three-dimensional MRCP images were rendered by post processing. CONTRAST:  10 cc Gadavist COMPARISON:  Ultrasound earlier today. FINDINGS: Mild to moderate degradation throughout secondary to patient body habitus and motion. Lower chest: Mild cardiomegaly, without pericardial or pleural effusion. Hepatobiliary: High left hepatic lobe cyst. Gallstones including at 3.0 cm. Gallbladder wall thickening, including at 10 mm on image 27/5. Mild intrahepatic biliary duct dilatation. The common duct measures 1.6 cm in the porta hepatis. 7 mm focus of T2 hypointensity in the distal common duct on images 15/3. This is felt to be confirmed on motion degraded dedicated MRCP image 92/9. Pancreas:  Normal, without mass or ductal dilatation. Spleen:  Normal in size, without focal abnormality. Adrenals/Urinary Tract: Normal adrenal glands. An upper pole left renal 2.0 cm lesion medially is consistent with a hemorrhagic/proteinaceous cyst. A lower pole right renal lesion measures 1.3 cm on image 10/3 and is not well evaluated on post-contrast images, including image 64/5007. Stomach/Bowel: Normal stomach and abdominal bowel loops. Vascular/Lymphatic: Normal caliber of the aorta and branch vessels. No abdominal adenopathy. Other:  No ascites. Musculoskeletal: Convex left lumbar spine curvature. IMPRESSION: 1. Mild to moderate motion degradation. 2. Cholelithiasis with gallbladder wall thickening, again suspicious for acute cholecystitis. 3. Biliary duct dilatation with a subtle 7 mm distal common duct stone. 4. Lower pole right renal lesion which is suboptimally evaluated, but most  likely represents a complex cyst. Consider renal ultrasound follow-up at 1 year. 5. Left renal hemorrhagic/proteinaceous cyst. Electronically Signed   By: Jeronimo Greaves M.D.   On: 02/06/2018 18:15  Labs:  CBC: Recent Labs    02/06/18 1251 02/07/18 0513 02/09/18 0111 02/10/18 0932  WBC 9.2 7.7 17.7* 11.3*  HGB 11.7* 10.7* 10.7* 10.1*  HCT 38.5 34.5* 35.2* 33.8*  PLT 255 244 249 221    COAGS: Recent Labs    02/07/18 1705  INR 1.07    BMP: Recent Labs    02/07/18 0513 02/08/18 0222 02/09/18 0111 02/10/18 0932  NA 141 140 137 136  K 3.9 3.9 3.8 3.6  CL 105 102 101 100  CO2 30 31 29 29   GLUCOSE 101* 103* 135* 103*  BUN 13 8 14 15   CALCIUM 8.8* 9.0 8.3* 8.5*  CREATININE 0.62 0.68 1.13* 0.86  GFRNONAA >60 >60 46* >60  GFRAA >60 >60 53* >60    LIVER FUNCTION TESTS: Recent Labs    02/06/18 1251 02/07/18 0513 02/08/18 0222 02/10/18 0932  BILITOT 0.6 0.6 0.5 0.7  AST 17 15 18 16   ALT 12 11 11 17   ALKPHOS 69 62 63 79  PROT 6.8 6.3* 5.9* 6.1*  ALBUMIN 3.6 3.3* 3.0* 2.6*    Assessment and Plan: Acute cholecystitis s/p drain placement 02/09/18 by Dr. Lowella Dandy Patient s/p cholecystostomy tube placement yesterday.  She has had perc drains in the past.  She is considering surgical intervention; at increased risk per Dr. Daphine Deutscher note.  Drain to remain in place for now.  Continue current management.  IR to follow.   Electronically Signed: Hoyt Koch, PA 02/10/2018, 2:55 PM   I spent a total of 15 Minutes at the the patient's bedside AND on the patient's hospital floor or unit, greater than 50% of which was counseling/coordinating care for cholecystits.

## 2018-02-10 NOTE — Progress Notes (Signed)
RT placed pt on CPAP for the night. Pt on auto titrate 10/5 with 3lpm bled into CPAP. RT will continue to monitor.

## 2018-02-10 NOTE — Progress Notes (Signed)
PROGRESS NOTE    Dawn Gonzales  ZOX:[id number] DOB: Jun 01, 1940 DOA: 02/06/2018 PCP: Benita Stabile, MD    Brief Narrative:  77 year old female who presented with abdominal pain. She does have significant past medical history for severe aortic stenosis, depression, obesity and dyslipidemia. Patient reported severe epigastric abdominal pain associated with nausea. On her initial physical examination blood pressure 147/87, heart rate 66, temperature 98.2, respiratory rate 18, oxygen saturation 91%. Moist mucous membranes, lungs clear to auscultation bilaterally, heart S1-S2 present and rhythmic, bowel sounds were positive, abdomen soft, but tender on the right side, negative Murphy sign, no lower extremity edema.Sodium 139, potassium 3.8, chloride 105, bicarb 26, glucose 108, BUN 18, creatinine 0.6, white count 9.2, hemoglobin 11.7, hematocrit 38.5, platelets 255,urinalysis negative for infection,chest x-ray with no infiltrates, right hemidiaphragm elevation,abdominal ultrasound with cholelithiasis, moderate gallbladder wall thickeningandcommon bile duct dilatation.EKG sinus rhythm, left axis deviation, left anterior fascicular block, right bundle branch block.  Patient was admitted to the hospitalwith theworking diagnosis of abdominal pain due to cholecystitis complicated by common bile ductobstruction   Assessment & Plan:   Principal Problem:   Epigastric pain Active Problems:   N&V (nausea and vomiting)   Calculus of gallbladder with biliary obstruction but without cholecystitis   Preoperative cardiovascular examination   Acute cholecystitis   Severe aortic stenosis   Choledocholithiasis   Cholecystitis, acute with cholelithiasis   Junctional bradycardia    1. Acute cholecystitis complicated with choledocholithiasis and sepsis (not present on admission) gram positive cocci. sp Endoscopic [place] and ERCP with stone extraction and biliary sphincterectomy.  Will continue  to advance diet as tolerated, abdominal pain has improved but not back to baseline. Biliary culture positive for gram positive cocci, will change antibiotic therapy to IV ceftriaxone, patient has remained afebrile and her wbc is at 11.3. Discontinue IV fluids, continue telemetry monitoring. Gallbladder intervention per surgery recommendations, likely will need a few more days of IV antibiotic therapy before any intervention. Cholecystostomy tube in place. Continue pain control with hydromorphone IV.   2. Severe aortic stenosis. Echocardiogram with preserved LV systolic function, EF 55 to 60%. Blood pressure systolic 107 to 109 mmHg. Will hold on IV fluids, continue telemetry monitoring. No signs of volume overload.   3. Sinus bradycardia with sinus pauses. Improved bradycardia with Cpap at night, will need formal outpatient sleep study.  4. Dyslipidemia. Continue pravastatin.  5. Morbid obesity. BMI 43. Will need outpatient follow up.  DVT prophylaxis:heparin Code Status:full Family Communication:no family at the bedside Disposition Plan/ discharge barriers:Pending clinical improvement.     Consultants:  GI   Surgery   Cardiology  Procedures:    Antimicrobials:     Subjective: Abdominal pain has improved but not back to baseline, drain has been placed, patient with no nausea or vomiting, no a clear liquid diet.   Objective: Vitals:   02/09/18 2145 02/09/18 2155 02/10/18 0409 02/10/18 0500  BP: 107/60  109/65   Pulse: 76  74   Resp: 17 16 16    Temp: 100.3 F (37.9 C)  98.7 F (37.1 C)   TempSrc: Oral  Oral   SpO2: 98% 97% 96%   Weight:    127.4 kg  Height:        Intake/Output Summary (Last 24 hours) at 02/10/2018 1142 Last data filed at 02/10/2018 0532 Gross per 24 hour  Intake 515 ml  Output 106 ml  Net 409 ml   Filed Weights   02/07/18 1513 02/08/18 0500 02/10/18 0500  Weight: 125.3  kg 124.3 kg 127.4 kg    Examination:    General: deconditioned  Neurology: Awake and alert, non focal  E ENT: mild pallor, no icterus, oral mucosa moist Cardiovascular: No JVD. S1-S2 present, rhythmic, no gallops, rubs, or murmurs. No lower extremity edema. Pulmonary: decreased breath sounds bilaterally at bases,  no wheezing, rhonchi or rales, on anterior auscultation.  Gastrointestinal. Abdomen protuberant, no organomegaly, non tender, no rebound or guarding.  Right upper quadrant drain in place with serosanguinous fluid.   Skin. No rashes Musculoskeletal: no joint deformities     Data Reviewed: I have personally reviewed following labs and imaging studies  CBC: Recent Labs  Lab 02/06/18 1251 02/07/18 0513 02/09/18 0111 02/10/18 0932  WBC 9.2 7.7 17.7* 11.3*  NEUTROABS 6.9  --  15.8*  --   HGB 11.7* 10.7* 10.7* 10.1*  HCT 38.5 34.5* 35.2* 33.8*  MCV 98.0 99.1 97.8 98.5  PLT 255 244 249 221   Basic Metabolic Panel: Recent Labs  Lab 02/06/18 1251 02/07/18 0513 02/08/18 0222 02/09/18 0111 02/10/18 0932  NA 139 141 140 137 136  K 3.8 3.9 3.9 3.8 3.6  CL 105 105 102 101 100  CO2 26 30 31 29 29   GLUCOSE 108* 101* 103* 135* 103*  BUN 18 13 8 14 15   CREATININE 0.60 0.62 0.68 1.13* 0.86  CALCIUM 9.2 8.8* 9.0 8.3* 8.5*   GFR: Estimated Creatinine Clearance: 74.8 mL/min (by C-G formula based on SCr of 0.86 mg/dL). Liver Function Tests: Recent Labs  Lab 02/06/18 1251 02/07/18 0513 02/08/18 0222 02/10/18 0932  AST 17 15 18 16   ALT 12 11 11 17   ALKPHOS 69 62 63 79  BILITOT 0.6 0.6 0.5 0.7  PROT 6.8 6.3* 5.9* 6.1*  ALBUMIN 3.6 3.3* 3.0* 2.6*   Recent Labs  Lab 02/06/18 1251  LIPASE 23   No results for input(s): AMMONIA in the last 168 hours. Coagulation Profile: Recent Labs  Lab 02/07/18 1705  INR 1.07   Cardiac Enzymes: Recent Labs  Lab 02/06/18 1251 02/06/18 1819 02/06/18 2302 02/07/18 0513  TROPONINI <0.03 <0.03 <0.03 <0.03   BNP (last 3 results) No results for input(s): PROBNP  in the last 8760 hours. HbA1C: No results for input(s): HGBA1C in the last 72 hours. CBG: Recent Labs  Lab 02/08/18 1022  GLUCAP 92   Lipid Profile: No results for input(s): CHOL, HDL, LDLCALC, TRIG, CHOLHDL, LDLDIRECT in the last 72 hours. Thyroid Function Tests: No results for input(s): TSH, T4TOTAL, FREET4, T3FREE, THYROIDAB in the last 72 hours. Anemia Panel: No results for input(s): VITAMINB12, FOLATE, FERRITIN, TIBC, IRON, RETICCTPCT in the last 72 hours.    Radiology Studies: I have reviewed all of the imaging during this hospital visit personally     Scheduled Meds: . aspirin EC  81 mg Oral Daily  . calcium carbonate  1,250 mg Oral Daily  . cholecalciferol  1,000 Units Oral Daily  . gabapentin  300 mg Oral TID  . heparin injection (subcutaneous)  5,000 Units Subcutaneous Q8H  . indomethacin  100 mg Rectal Once  . mouth rinse  15 mL Mouth Rinse BID  . multivitamin with minerals  1 tablet Oral Daily  . pantoprazole  40 mg Oral Daily  . polyethylene glycol  17 g Oral Daily  . pravastatin  20 mg Oral q1800  . sodium chloride flush  3 mL Intravenous Q12H  . sodium chloride flush  5 mL Intracatheter Q8H  . venlafaxine  37.5 mg Oral Daily  .  vitamin C  250 mg Oral Daily   Continuous Infusions: . sodium chloride 0 mL/hr at 02/09/18 1642  . sodium chloride    . cefTRIAXone (ROCEPHIN)  IV       LOS: 3 days        Dawn Tholl Annett Gula, MD [facility] Pager [phone]

## 2018-02-10 NOTE — Progress Notes (Signed)
Daily Rounding Note  02/10/2018, 8:14 AM  LOS: 3 days   SUBJECTIVE:   Chief complaint: Choledocholithiasis, cholecystitis Nausea has not recurred.  She is passing gas.  Has not had a bowel movement yet. The percutaneous cholecystostomy tube hurts a little bit when she moves around, the pain is not severe.  It drained 106 ml since it was placed.   OBJECTIVE:         Vital signs in last 24 hours:    Temp:  [98.5 F (36.9 C)-100.3 F (37.9 C)] 98.7 F (37.1 C) (10/13 0409) Pulse Rate:  [74-81] 74 (10/13 0409) Resp:  [16-24] 16 (10/13 0409) BP: (97-122)/(60-71) 109/65 (10/13 0409) SpO2:  [94 %-100 %] 96 % (10/13 0409) Weight:  [127.4 kg] 127.4 kg (10/13 0500) Last BM Date: 02/06/18 Filed Weights   02/07/18 1513 02/08/18 0500 02/10/18 0500  Weight: 125.3 kg 124.3 kg 127.4 kg   General: Pleasant, comfortable.  Chronically ill appearance. Heart: RRR.  Harsh systolic murmur. Chest: Clear bilaterally.  No labored breathing, no cough. Abdomen: Soft, obese.  Minimal discomfort near the cholecystostomy tube in right lower quadrant. Extremities: Lower extremity edema Neuro/Psych: Pleasant, calm, fluid speech.  Moves all 4 limbs.  Fully oriented and appropriate.  Intake/Output from previous day: 10/12 0701 - 10/13 0700 In: 515 [I.V.:338.5; IV Piggyback:161.5] Out: 106 [Drains:106]  Intake/Output this shift: No intake/output data recorded.  Lab Results: Recent Labs    02/09/18 0111  WBC 17.7*  HGB 10.7*  HCT 35.2*  PLT 249   BMET Recent Labs    02/08/18 0222 02/09/18 0111  NA 140 137  K 3.9 3.8  CL 102 101  CO2 31 29  GLUCOSE 103* 135*  BUN 8 14  CREATININE 0.68 1.13*  CALCIUM 9.0 8.3*   LFT Recent Labs    02/08/18 0222  PROT 5.9*  ALBUMIN 3.0*  AST 18  ALT 11  ALKPHOS 63  BILITOT 0.5   PT/INR Recent Labs    02/07/18 1705  LABPROT 13.8  INR 1.07   Scheduled Meds: . aspirin EC  81 mg  Oral Daily  . calcium carbonate  1,250 mg Oral Daily  . cholecalciferol  1,000 Units Oral Daily  . gabapentin  300 mg Oral TID  . heparin injection (subcutaneous)  5,000 Units Subcutaneous Q8H  . indomethacin  100 mg Rectal Once  . mouth rinse  15 mL Mouth Rinse BID  . multivitamin with minerals  1 tablet Oral Daily  . pantoprazole  40 mg Oral Daily  . polyethylene glycol  17 g Oral Daily  . pravastatin  20 mg Oral q1800  . sodium chloride flush  3 mL Intravenous Q12H  . sodium chloride flush  5 mL Intracatheter Q8H  . venlafaxine  37.5 mg Oral Daily  . vitamin C  250 mg Oral Daily   Continuous Infusions: . sodium chloride 0 mL/hr at 02/09/18 1642  . sodium chloride    . dextrose 5 % and 0.9% NaCl 50 mL/hr at 02/09/18 2046  . piperacillin-tazobactam 3.375 g (02/10/18 0532)   PRN Meds:.sodium chloride, sodium chloride, acetaminophen **OR** acetaminophen, hydrALAZINE, HYDROmorphone (DILAUDID) injection, ondansetron (ZOFRAN) IV, phenol, sodium chloride flush  Studies/Results: Ir Perc Cholecystostomy Ct Abd Limited W/o Cm  Result Date: 02/09/2018 INDICATION: 77 year old with choledocholithiasis, cholelithiasis and concern for cholecystitis. Prior cholecystostomy tube. EXAM: IMAGE GUIDED PERCUTANEOUS CHOLECYSTOSTOMY TUBE PLACEMENT MEDICATIONS: Inpatient on antibiotics ANESTHESIA/SEDATION: Moderate (conscious) sedation was employed during this procedure. A  total of Versed 1 mg and Fentanyl 50 mcg was administered intravenously. Moderate Sedation Time: 22 minutes. The patient's level of consciousness and vital signs were monitored continuously by radiology nursing throughout the procedure under my direct supervision. FLUOROSCOPY TIME:  None COMPLICATIONS: None immediate. PROCEDURE: Informed written consent was obtained from the patient after a thorough discussion of the procedural risks, benefits and alternatives. All questions were addressed. A timeout was performed prior to the initiation of  the procedure. Patient was placed supine on the CT scanner. Images through the upper abdomen were obtained. Abdomen was also evaluated with ultrasound. Abnormal gallbladder was identified in the anterior right upper abdomen. The right upper abdomen was prepped and draped in sterile fashion. Maximal barrier sterile technique was utilized including caps, mask, sterile gowns, sterile gloves, sterile drape, hand hygiene and skin antiseptic. Skin was anesthetized with 1% lidocaine. 18 gauge trocar needle was directed into the abnormal gallbladder using ultrasound and CT guidance. Needle placement was difficult due to large gallstone. Eventually, wire was confirmed within the gallbladder. The tract was dilated to accommodate a 10.2 [place] multipurpose drain. Approximately 5 mL of brown purulent looking fluid was aspirated. CT images confirmed drain placement in the gallbladder. The catheter was sutured to skin and attached to a gravity bag. FINDINGS: Stone filled gallbladder with surrounding inflammatory changes. Drain was successfully placed within this abnormal gallbladder. 5 mL of dark purulent looking fluid was removed and sent for culture. IMPRESSION: Successful image guided percutaneous cholecystostomy tube placement. Gallbladder is nondistended and stone filled. Small amount of fluid was removed from the gallbladder and sent for culture. Electronically Signed   By: Richarda Overlie M.D.   On: 02/09/2018 13:36     ASSESMENT:   *   Choledocholithiasis. 02/08/2018 EUS:   Dilated CBD and common hepatic duct.  CBD stone.  GB stone, GB wall thickening concerning for cholecystitis.  Deformity in the first duodenum. 02/08/2018 ERCP:   Dilation of main bile duct and right/left hepatic ducts, intrahepatic branches.  CBD stone removed with balloon sweep following biliary sphincterotomy.  Cystic duct did not opacify with occlusion cholangiogram.  *    Cholelithiasis.  Cholecystitis. Percutaneous cholecystostomy tube for  several weeks between December 2018-January 2019. Perc cholecystomy placed 10/12 in setting of above findings, fevers, rising WBCs, CT c/w cholecystitis.  .   Blood clxs obtained 10/11 with no growth so far. Stain shows rare WBCs, predominantly PMNs.  Rare GP cocci Cephtriaxone for 2 days >> zosyn day 2. Tolerating solid food  *  Constipation, tendency to this at home.     *     Severe aortic stenosis.  Not decompensated.  Did well with general anesthesia for ERCP.  eventually will need TAVR.    *   Junctional rhythm observed ? Due to undiagnosed OSA?.  CPAP initiated.    *    Normocytic anemia.  Stable Hgb.    *     AKI, mild.    PLAN   *  Ordered LFTs and CBC for this AM.    *   Added daily Miralax.    *   GI available prn.      Jennye Moccasin  02/10/2018, 8:14 AM Phone [phone]

## 2018-02-11 LAB — BASIC METABOLIC PANEL
Anion gap: 7 (ref 5–15)
BUN: 14 mg/dL (ref 8–23)
CO2: 30 mmol/L (ref 22–32)
Calcium: 8.7 mg/dL — ABNORMAL LOW (ref 8.9–10.3)
Chloride: 101 mmol/L (ref 98–111)
Creatinine, Ser: 0.8 mg/dL (ref 0.44–1.00)
GFR calc Af Amer: >60 mL/min (ref >60)
GFR calc non Af Amer: >60 mL/min (ref >60)
Glucose, Bld: 106 mg/dL — ABNORMAL HIGH (ref 70–99)
Potassium: 3.9 mmol/L (ref 3.5–5.1)
Sodium: 138 mmol/L (ref 135–145)

## 2018-02-11 LAB — CBC WITH DIFFERENTIAL/PLATELET
Abs Immature Granulocytes: 0.04 10*3/uL (ref 0.00–0.07)
Basophils Absolute: 0 10*3/uL (ref 0.0–0.1)
Basophils Relative: 0 %
Eosinophils Absolute: 0.4 10*3/uL (ref 0.0–0.5)
Eosinophils Relative: 5 %
HCT: 32.8 % — ABNORMAL LOW (ref 36.0–46.0)
Hemoglobin: 10.2 g/dL — ABNORMAL LOW (ref 12.0–15.0)
Immature Granulocytes: 1 %
Lymphocytes Relative: 13 %
Lymphs Abs: 1.1 10*3/uL (ref 0.7–4.0)
MCH: 30.3 pg (ref 26.0–34.0)
MCHC: 31.1 g/dL (ref 30.0–36.0)
MCV: 97.3 fL (ref 80.0–100.0)
Monocytes Absolute: 0.5 10*3/uL (ref 0.1–1.0)
Monocytes Relative: 6 %
Neutro Abs: 6.4 10*3/uL (ref 1.7–7.7)
Neutrophils Relative %: 75 %
Platelets: 229 10*3/uL (ref 150–400)
RBC: 3.37 MIL/uL — ABNORMAL LOW (ref 3.87–5.11)
RDW: 14.1 % (ref 11.5–15.5)
WBC: 8.5 10*3/uL (ref 4.0–10.5)
nRBC: 0 % (ref 0.0–0.2)

## 2018-02-11 NOTE — Progress Notes (Signed)
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 Valve area (VTI): 0.67 cm^2. Valve area (Vmax): 0.76 cm^2. Valve area(Vmean): 0.67 cm^2.   Mitral valve:  Severely calcified annulus. The findings areconsistent with mild stenosis.  EF 55-60%, Grade 1 diastolic dysfunction  Choledocholithiasis Hx of cholecystostomy tube last year/ removed - no surgery so far - S/P ERCP, 10/11, Dr. Meridee Score  FEN:  Heart healthy ID: Rocephin 10/9>>    Zosyn 10/11-10/13 DVT:  SCD, heparin Follow up:  Dr. Dwain Sarna  Plan: No surgery at this time. F/u with Dr. Dwain Sarna in 6 weeks.    LOS: 4 days    Subjective: CC: abdominal soreness  Pt states pain unchanged since drain. She denies nausea, vomiting, fever or chills. She is having flatus. She is tolerating her diet. No family at bedside.   Objective: Vital signs in last 24 hours: Temp:  [98.9 F (37.2 C)-99.3 F (37.4 C)] 99.3 F (37.4 C) (10/14 0501) Pulse Rate:  [78-84] 84 (10/14 0501) Resp:  [18-20] 18 (10/14 0501) BP: (109-128)/(62-69) 128/69 (10/14 0501) SpO2:  [91 %-98 %] 91 % (10/14 0501) Weight:  [126.4 kg] 126.4 kg (10/14 0500) Last BM Date: 02/06/18  Intake/Output from previous day: 10/13 0701 - 10/14 0700 In: 1488 [P.O.:840; I.V.:438; IV Piggyback:200] Out: 600 [Urine:600] Intake/Output this shift: No intake/output data recorded.  PE: Gen:  Alert, NAD, pleasant, cooperative Pulm:  Rate and effort normal Abd: Soft, obese, ND, +BS, RUQ and epigastric TTP, no guarding, no peritonitis, RUQ rain with serosanguinous drainage Skin: no rashes noted, warm and dry   Anti-infectives: Anti-infectives (From admission, onward)   Start     Dose/Rate Route Frequency Ordered Stop   02/10/18 1145  cefTRIAXone (ROCEPHIN) 2 g in sodium chloride 0.9 % 100 mL  IVPB     2 g 200 mL/hr over 30 Minutes Intravenous Every 24 hours 02/10/18 1141     02/09/18 1400  piperacillin-tazobactam (ZOSYN) IVPB 3.375 g  Status:  Discontinued     3.375 g 12.5 mL/hr over 240 Minutes Intravenous Every 8 hours 02/09/18 1341 02/10/18 1141   02/08/18 1800  piperacillin-tazobactam (ZOSYN) IVPB 3.375 g  Status:  Discontinued     3.375 g 12.5 mL/hr over 240 Minutes Intravenous Every 8 hours 02/08/18 1516 02/09/18 1341   02/07/18 1700  cefTRIAXone (ROCEPHIN) 2 g in sodium chloride 0.9 % 100 mL IVPB  Status:  Discontinued     2 g 200 mL/hr over 30 Minutes Intravenous Every 24 hours 02/07/18 1320 02/08/18 1516   02/07/18 1315  cefTRIAXone (ROCEPHIN) 2 g in sodium chloride 0.9 % 100 mL IVPB  Status:  Discontinued     2 g 200 mL/hr over 30 Minutes Intravenous Every 24 hours 02/07/18 1309 02/07/18 1320   02/06/18 1530  cefTRIAXone (ROCEPHIN) 2 g in sodium chloride 0.9 % 100 mL IVPB     2 g 200 mL/hr over 30 Minutes Intravenous  Once 02/06/18 1524 02/06/18 1919      Lab Results:  Recent Labs    02/10/18 0932 02/11/18 0146  WBC 11.3* 8.5  HGB 10.1* 10.2*  HCT 33.8* 32.8*  PLT 221 229   BMET Recent Labs    02/10/18 0932 02/11/18 0146  NA 136 138  K 3.6 3.9  CL 100 101  CO2 29 30  GLUCOSE 103* 106*  BUN 15 14  CREATININE 0.86 0.80  CALCIUM 8.5* 8.7*   PT/INR No results for input(s): LABPROT, INR in the last 72 hours. CMP     Component Value Date/Time   NA 138 02/11/2018 0146   K 3.9 02/11/2018 0146   CL 101 02/11/2018 0146   CO2 30 02/11/2018 0146   GLUCOSE 106 (H) 02/11/2018 0146   BUN 14 02/11/2018 0146   CREATININE 0.80 02/11/2018 0146   CALCIUM 8.7 (L) 02/11/2018 0146   PROT 6.1 (L) 02/10/2018 0932   ALBUMIN 2.6 (L) 02/10/2018 0932   AST 16 02/10/2018 0932   ALT 17 02/10/2018 0932   ALKPHOS 79 02/10/2018 0932   BILITOT 0.7 02/10/2018 0932   GFRNONAA >60 02/11/2018 0146   GFRAA >60 02/11/2018 0146   Lipase     Component Value Date/Time    LIPASE 23 02/06/2018 1251    Studies/Results: Ir Perc Cholecystostomy  Result Date: 02/09/2018 INDICATION: 77 year old with choledocholithiasis, cholelithiasis and concern for cholecystitis. Prior cholecystostomy tube. EXAM: IMAGE GUIDED PERCUTANEOUS CHOLECYSTOSTOMY TUBE PLACEMENT MEDICATIONS: Inpatient on antibiotics ANESTHESIA/SEDATION: Moderate (conscious) sedation was employed during this procedure. A total of Versed 1 mg and Fentanyl 50 mcg was administered intravenously. Moderate Sedation Time: 22 minutes. The patient's level of consciousness and vital signs were monitored continuously by radiology nursing throughout the procedure under my direct supervision. FLUOROSCOPY TIME:  None COMPLICATIONS: None immediate. PROCEDURE: Informed written consent was obtained from the patient after a thorough discussion of the procedural risks, benefits and alternatives. All questions were addressed. A timeout was performed prior to the initiation of the procedure. Patient was placed supine on the CT scanner. Images through the upper abdomen were obtained. Abdomen was also evaluated with ultrasound. Abnormal gallbladder was identified in the anterior right upper abdomen. The right upper abdomen was prepped and draped in sterile fashion. Maximal barrier sterile technique was utilized including caps, mask, sterile gowns, sterile gloves, sterile drape, hand hygiene and skin antiseptic. Skin was anesthetized with 1% lidocaine. 18 gauge trocar needle was directed into the abnormal gallbladder using ultrasound and CT guidance. Needle placement was difficult due to large gallstone. Eventually, wire was confirmed within the gallbladder. The tract was dilated to accommodate a 10.2 [place] multipurpose drain. Approximately 5 mL of brown purulent looking fluid was aspirated. CT images confirmed drain placement in the gallbladder. The catheter was sutured to skin and attached to a gravity bag. FINDINGS: Stone filled gallbladder  with surrounding inflammatory changes. Drain was successfully placed within this abnormal gallbladder. 5 mL of dark purulent looking fluid was removed and sent for culture. IMPRESSION: Successful image guided percutaneous cholecystostomy tube placement. Gallbladder is nondistended and stone filled. Small amount of fluid was removed from the gallbladder and sent for culture. Electronically Signed   By: Richarda Overlie M.D.   On: 02/09/2018 13:36   Ct Abd Limited W/o Cm  Result Date: 02/09/2018 INDICATION: 77 year old with choledocholithiasis, cholelithiasis and concern for cholecystitis. Prior cholecystostomy tube. EXAM: IMAGE GUIDED PERCUTANEOUS CHOLECYSTOSTOMY TUBE PLACEMENT MEDICATIONS: Inpatient on antibiotics ANESTHESIA/SEDATION: Moderate (conscious) sedation was employed during this procedure. A total of Versed 1 mg and Fentanyl 50 mcg was administered intravenously. Moderate Sedation Time: 22 minutes. The patient's level of consciousness and vital signs were monitored continuously by radiology nursing throughout the procedure under my direct supervision. FLUOROSCOPY TIME:  None COMPLICATIONS: None immediate. PROCEDURE: Informed written consent was obtained from the patient after a thorough discussion of the procedural risks, benefits and alternatives. All questions were addressed. A timeout was performed prior to the  initiation of the procedure. Patient was placed supine on the CT scanner. Images through the upper abdomen were obtained. Abdomen was also evaluated with ultrasound. Abnormal gallbladder was identified in the anterior right upper abdomen. The right upper abdomen was prepped and draped in sterile fashion. Maximal barrier sterile technique was utilized including caps, mask, sterile gowns, sterile gloves, sterile drape, hand hygiene and skin antiseptic. Skin was anesthetized with 1% lidocaine. 18 gauge trocar needle was directed into the abnormal gallbladder using ultrasound and CT guidance. Needle  placement was difficult due to large gallstone. Eventually, wire was confirmed within the gallbladder. The tract was dilated to accommodate a 10.2 [place] multipurpose drain. Approximately 5 mL of brown purulent looking fluid was aspirated. CT images confirmed drain placement in the gallbladder. The catheter was sutured to skin and attached to a gravity bag. FINDINGS: Stone filled gallbladder with surrounding inflammatory changes. Drain was successfully placed within this abnormal gallbladder. 5 mL of dark purulent looking fluid was removed and sent for culture. IMPRESSION: Successful image guided percutaneous cholecystostomy tube placement. Gallbladder is nondistended and stone filled. Small amount of fluid was removed from the gallbladder and sent for culture. Electronically Signed   By: Richarda Overlie M.D.   On: 02/09/2018 13:36      Jerre Simon , [facility] 02/11/2018, 8:48 AM  Pager: [phone] Mon-Wed, Friday 7:00am-4:30pm Thurs 7am-11:30am  Consults: [phone]

## 2018-02-11 NOTE — Clinical Social Work Note (Signed)
Clinical Social Work Assessment  Patient Details  Name: Dawn Gonzales MRN: [id number] Date of Birth: Nov 24, 1940  Date of referral:  02/11/18               Reason for consult:  Facility Placement, Discharge Planning                Permission sought to share information with:  Facility Sport and exercise psychologist, Family Supports Permission granted to share information::  Yes, Verbal Permission Granted  Name::     Health and safety inspector::  SNFs  Relationship::  daughter  Contact Information:     Housing/Transportation Living arrangements for the past 2 months:  Single Family Home Source of Information:  Patient Patient Interpreter Needed:  None Criminal Activity/Legal Involvement Pertinent to Current Situation/Hospitalization:  No - Comment as needed Significant Relationships:  Adult Children Lives with:  Self Do you feel safe going back to the place where you live?  Yes Need for family participation in patient care:  Yes (Comment)  Care giving concerns:  Pt lives with her husband at home, receives assistance from her daughter but states "I feel like a burden." States her husband unable/unwilling to provide assistance. Would like to go home but open to the idea of SNF.   Social Worker assessment / plan:  CSW met with pt at bedside. Pt lives with her husband and has a daughter that provides most of the physical assistance as needed. Pt daughter works and pt has grandchildren. Pt states that she went to a SNF when living in [facility] but hasn't been to one recently. We review referral process and encouraged pt to discuss home vs. SNF with her daughter. Pt in agreement. Will revisit pt tomorrow morning for any additional support and to initiate authorization if interested.   Employment status:  Retired Nurse, adult PT Recommendations:  [place] / Referral to community resources:  [place]  Patient/Family's Response to  care:  Pt amenable to [place] visit, understands recommendations, currently undecided about disposition.   Patient/Family's Understanding of and Emotional Response to Diagnosis, Current Treatment, and Prognosis:  Pt states understanding of diagnosis, current treatment and prognosis. Pt open to recommendations and is knowledgeable about her limitations and the limitations of those who help her. Pt was quiet and at times appeared sad while discussing her husbands lack of motivation to help himself or others. Pt does appear happy with care here at hospital.   Emotional Assessment Appearance:  Appears stated age Attitude/Demeanor/Rapport:  Engaged, Gracious Affect (typically observed):  Accepting, Adaptable, Appropriate, Quiet Orientation:  Oriented to Place, Oriented to  Time, Oriented to Situation, Oriented to Self Alcohol / Substance use:  Not Applicable Psych involvement (Current and /or in the community):  No (Comment)  Discharge Needs  Concerns to be addressed:  Care Coordination Readmission within the last 30 days:  No Current discharge risk:  Dependent with Mobility, Lives alone Barriers to Discharge:  Ship broker, Continued Medical Work up   [employer], [place] 02/11/2018, 5:16 PM

## 2018-02-11 NOTE — Progress Notes (Signed)
PROGRESS NOTE    Dawn Gonzales  WUJ:[id number] DOB: Feb 04, 1941 DOA: 02/06/2018 PCP: Benita Stabile, MD    Brief Narrative:  77 year old female who presented with abdominal pain. She does have significant past medical history for severe aortic stenosis, depression, obesity anddyslipidemia. Patient reported severe epigastric abdominal pain associated with nausea.On herinitial physical examination blood pressure 147/87, heart rate 66, temperature 98.2, respiratory rate 18, oxygen saturation 91%. Moist mucous membranes, lungs clear to auscultation bilaterally, heart S1-S2 present and rhythmic, bowel sounds were positive, abdomen soft, buttender on the right side, negative Murphy sign, no lower extremity edema.Sodium 139, potassium 3.8, chloride 105, bicarb 26, glucose 108, BUN 18, creatinine 0.6, white count 9.2, hemoglobin 11.7, hematocrit 38.5, platelets 255,urinalysis negative for infection,chest x-ray with no infiltrates, right hemidiaphragm elevation,abdominal ultrasound with cholelithiasis, moderate gallbladder wall thickeningandcommon bile duct dilatation.EKG sinus rhythm, left axis deviation, left anterior fascicular block, rightbundlebranch block.  Patient was admitted to the hospitalwith theworking diagnosis of abdominal pain due to cholecystitis complicated by common bile ductobstruction   Assessment & Plan:   Principal Problem:   Epigastric pain Active Problems:   N&V (nausea and vomiting)   Calculus of gallbladder with biliary obstruction but without cholecystitis   Preoperative cardiovascular examination   Acute cholecystitis   Severe aortic stenosis   Choledocholithiasis   Cholecystitis, acute with cholelithiasis   Junctional bradycardia   1. Acute cholecystitis complicated with choledocholithiasisand sepsis (not present on admission) gram positive cocci.sp Endoscopic [place] and ERCP with stone extraction and biliary sphincterectomy.   Biliary culture  positive for gram positive cocci. Antibiotic therapy to IV ceftriaxone, will plan for 2 weeks of antibiotic therapy, to complete with oral agents, patient has remained afebrile, wbc down to 8,5. Drain in place. Will plan for outpatient follow up with surgery and cardiology after completing antibiotic therapy for possible cholecystectomy. Very deconditioned will need SNF.   2. Severe aortic stenosis. Echocardiogram with preserved LV systolic function, EF 55 to 60%.Patient has remained hemodynamically stable, will continue telemetry monitoring for now.   3. Sinus bradycardia with sinus pauses.Continue Cpap, will plan to continue as outpatient. Social services will be consulted.   4. Dyslipidemia.Onpravastatin with good toleration.  5. Morbid obesity. BMI 43. Need outpatient follow up for weight reduction..  DVT prophylaxis:heparin Code Status:full Family Communication:no family at the bedside Disposition Plan/ discharge barriers:SNF placement   Consultants:  GI   Surgery   Cardiology  Procedures:    Antimicrobials:    Subjective: Patient with improvement in abdominal pain, not yet back to baseline, no nausea or vomiting, no chest pain or dyspnea. Very weak and deconditioned, agrees with snf at discharge.   Objective: Vitals:   02/10/18 1944 02/10/18 2120 02/11/18 0500 02/11/18 0501  BP: 111/69   128/69  Pulse: 78   84  Resp: 18   18  Temp: 98.9 F (37.2 C)   99.3 F (37.4 C)  TempSrc: Oral   Oral  SpO2: 98% 97%  91%  Weight:   126.4 kg   Height:        Intake/Output Summary (Last 24 hours) at 02/11/2018 1506 Last data filed at 02/11/2018 0930 Gross per 24 hour  Intake 903 ml  Output 600 ml  Net 303 ml   Filed Weights   02/08/18 0500 02/10/18 0500 02/11/18 0500  Weight: 124.3 kg 127.4 kg 126.4 kg    Examination:   General: deconditioned  Neurology: Awake and alert, non focal  E ENT: mild pallor, no icterus, oral mucosa  moist Cardiovascular: No JVD. S1-S2 present, rhythmic, no gallops, or rubs, III/VI systolic murmur. Trace lower extremity edema. Pulmonary:  positive breath sounds bilaterally, adequate air movement, no wheezing, rhonchi or rales. Gastrointestinal. Abdomen protuberant with no organomegaly, non tender, no rebound or guarding Skin. No rashes Musculoskeletal: no joint deformities     Data Reviewed: I have personally reviewed following labs and imaging studies  CBC: Recent Labs  Lab 02/06/18 1251 02/07/18 0513 02/09/18 0111 02/10/18 0932 02/11/18 0146  WBC 9.2 7.7 17.7* 11.3* 8.5  NEUTROABS 6.9  --  15.8*  --  6.4  HGB 11.7* 10.7* 10.7* 10.1* 10.2*  HCT 38.5 34.5* 35.2* 33.8* 32.8*  MCV 98.0 99.1 97.8 98.5 97.3  PLT 255 244 249 221 229   Basic Metabolic Panel: Recent Labs  Lab 02/07/18 0513 02/08/18 0222 02/09/18 0111 02/10/18 0932 02/11/18 0146  NA 141 140 137 136 138  K 3.9 3.9 3.8 3.6 3.9  CL 105 102 101 100 101  CO2 30 31 29 29 30   GLUCOSE 101* 103* 135* 103* 106*  BUN 13 8 14 15 14   CREATININE 0.62 0.68 1.13* 0.86 0.80  CALCIUM 8.8* 9.0 8.3* 8.5* 8.7*   GFR: Estimated Creatinine Clearance: 80 mL/min (by C-G formula based on SCr of 0.8 mg/dL). Liver Function Tests: Recent Labs  Lab 02/06/18 1251 02/07/18 0513 02/08/18 0222 02/10/18 0932  AST 17 15 18 16   ALT 12 11 11 17   ALKPHOS 69 62 63 79  BILITOT 0.6 0.6 0.5 0.7  PROT 6.8 6.3* 5.9* 6.1*  ALBUMIN 3.6 3.3* 3.0* 2.6*   Recent Labs  Lab 02/06/18 1251  LIPASE 23   No results for input(s): AMMONIA in the last 168 hours. Coagulation Profile: Recent Labs  Lab 02/07/18 1705  INR 1.07   Cardiac Enzymes: Recent Labs  Lab 02/06/18 1251 02/06/18 1819 02/06/18 2302 02/07/18 0513  TROPONINI <0.03 <0.03 <0.03 <0.03   BNP (last 3 results) No results for input(s): PROBNP in the last 8760 hours. HbA1C: No results for input(s): HGBA1C in the last 72 hours. CBG: Recent Labs  Lab 02/08/18 1022   GLUCAP 92   Lipid Profile: No results for input(s): CHOL, HDL, LDLCALC, TRIG, CHOLHDL, LDLDIRECT in the last 72 hours. Thyroid Function Tests: No results for input(s): TSH, T4TOTAL, FREET4, T3FREE, THYROIDAB in the last 72 hours. Anemia Panel: No results for input(s): VITAMINB12, FOLATE, FERRITIN, TIBC, IRON, RETICCTPCT in the last 72 hours.    Radiology Studies: I have reviewed all of the imaging during this hospital visit personally     Scheduled Meds: . aspirin EC  81 mg Oral Daily  . calcium carbonate  1,250 mg Oral Daily  . cholecalciferol  1,000 Units Oral Daily  . gabapentin  300 mg Oral TID  . heparin injection (subcutaneous)  5,000 Units Subcutaneous Q8H  . indomethacin  100 mg Rectal Once  . mouth rinse  15 mL Mouth Rinse BID  . multivitamin with minerals  1 tablet Oral Daily  . pantoprazole  40 mg Oral Daily  . polyethylene glycol  17 g Oral Daily  . pravastatin  20 mg Oral q1800  . sodium chloride flush  3 mL Intravenous Q12H  . sodium chloride flush  5 mL Intracatheter Q8H  . venlafaxine  37.5 mg Oral Daily  . vitamin C  250 mg Oral Daily   Continuous Infusions: . sodium chloride Stopped (02/11/18 0600)  . sodium chloride    . cefTRIAXone (ROCEPHIN)  IV 2 g (02/11/18 1207)  LOS: 4 days        Tawni Millers, MD [facility] Pager [phone]

## 2018-02-11 NOTE — Evaluation (Signed)
Physical Therapy Evaluation Patient Details Name: Dawn Gonzales MRN: [id number] DOB: 09-07-1940 Today's Date: 02/11/2018   History of Present Illness  77 year old female who presented with abdominal pain.  She does have significant past medical history for severe aortic stenosis, depression, obesity and dyslipidemia. Dx acute cholecystitis complicated with choledocholithiasis and sepsis (not present on admission) gram positive cocci. sp Endoscopic [place] and ERCP with stone extraction and biliary sphincterectomy.   Clinical Impression  PTA pt lives with husband in single story home with ramped entrance and utilizes RW to transfer to W/C for mobility. Pt currently is limited in safe mobility by increased fatigue, and pain in R LQ and R knee, as well as decreased strength and endurance. Pt requires min guard for bed mobility, minA for transfers and mod A for stand step transfer to recliner. PT recommends SNF level rehab at discharge to increase mobility before going home. PT will continue to follow acutely.    Follow Up Recommendations SNF    Equipment Recommendations  None recommended by PT    Recommendations for Other Services OT consult     Precautions / Restrictions Precautions Precautions: Fall Precaution Comments: hx Restrictions Weight Bearing Restrictions: No      Mobility  Bed Mobility Overal bed mobility: Needs Assistance Bed Mobility: Supine to Sit     Supine to sit: Min guard;HOB elevated     General bed mobility comments: increased time and effort required for coming EoB, vc for scooting hips to EoB for feet flat on floor  Transfers Overall transfer level: Needs assistance Equipment used: Rolling walker (2 wheeled) Transfers: Sit to/from Stand Sit to Stand: Min assist         General transfer comment: pt requires 2x attempts and then minA for power up to RW, vc for hand placement but ultimately utilizes weightbearing on forearms on RW hand  grips  Ambulation/Gait Ambulation/Gait assistance: Mod assist Gait Distance (Feet): 2 Feet Assistive device: Rolling walker (2 wheeled) Gait Pattern/deviations: Step-to pattern;Shuffle;Trunk flexed Gait velocity: slowed Gait velocity interpretation: <1.31 ft/sec, indicative of household ambulator General Gait Details: modA for steadying with RW to take shuffling steps towards recliner on R from bed         Balance Overall balance assessment: Needs assistance Sitting-balance support: Feet supported;No upper extremity supported Sitting balance-Leahy Scale: Fair     Standing balance support: Bilateral upper extremity supported Standing balance-Leahy Scale: Poor Standing balance comment: requires RW and threapist support                             Pertinent Vitals/Pain Pain Assessment: 0-10 Pain Score: 3  Pain Location: R LQ , R knee  Pain Descriptors / Indicators: Aching;Sore Pain Intervention(s): Limited activity within patient's tolerance;Premedicated before session;Repositioned    Home Living Family/patient expects to be discharged to:: Private residence Living Arrangements: Spouse/significant other Available Help at Discharge: Available 24 hours/day;Family Type of Home: House Home Access: Stairs to enter;Ramped entrance   Entrance Stairs-Number of Steps: 5 Home Layout: One level Home Equipment: Walker - 2 wheels;Wheelchair - manual      Prior Function Level of Independence: Needs assistance   Gait / Transfers Assistance Needed: uses RW to transfer to [facility], able to mobilize in house without assist  ADL's / Homemaking Assistance Needed: independent with bathing and dressing, cooking and light housework           Extremity/Trunk Assessment   Upper Extremity Assessment Upper Extremity Assessment: Overall  WFL for tasks assessed    Lower Extremity Assessment Lower Extremity Assessment: Generalized weakness;RLE deficits/detail RLE Deficits /  Details: R knee pain, decreased flexion and extension, AROM of hip and knee limited by body habitus       Communication   Communication: No difficulties  Cognition Arousal/Alertness: Awake/alert Behavior During Therapy: WFL for tasks assessed/performed Overall Cognitive Status: Within Functional Limits for tasks assessed                                        General Comments General comments (skin integrity, edema, etc.): VSS, 3/4 DoE with movement        Assessment/Plan    PT Assessment Patient needs continued PT services  PT Problem List Decreased strength;Decreased range of motion;Decreased activity tolerance;Decreased balance;Decreased mobility;Obesity;Pain;Cardiopulmonary status limiting activity       PT Treatment Interventions DME instruction;Gait training;Functional mobility training;Therapeutic activities;Therapeutic exercise;Balance training;Patient/family education    PT Goals (Current goals can be found in the Care Plan section)  Acute Rehab PT Goals Patient Stated Goal: be able to have mobility she had prior to hospitalization PT Goal Formulation: With patient Time For Goal Achievement: 02/25/18 Potential to Achieve Goals: Fair    Frequency Min 2X/week    AM-PAC PT "6 Clicks" Daily Activity  Outcome Measure Difficulty turning over in bed (including adjusting bedclothes, sheets and blankets)?: Unable Difficulty moving from lying on back to sitting on the side of the bed? : Unable Difficulty sitting down on and standing up from a chair with arms (e.g., wheelchair, bedside commode, etc,.)?: Unable Help needed moving to and from a bed to chair (including a wheelchair)?: A Lot Help needed walking in hospital room?: Total Help needed climbing 3-5 steps with a railing? : Total 6 Click Score: 7    End of Session Equipment Utilized During Treatment: Gait belt Activity Tolerance: Patient limited by fatigue;Patient tolerated treatment well Patient  left: in chair;with call bell/phone within reach;with chair alarm set Nurse Communication: Mobility status PT Visit Diagnosis: Unsteadiness on feet (R26.81);Other abnormalities of gait and mobility (R26.89);Muscle weakness (generalized) (M62.81);History of falling (Z91.81);Difficulty in walking, not elsewhere classified (R26.2);Pain Pain - Right/Left: Right Pain - part of body: Knee(R LQ)    Time: 1610-9604 PT Time Calculation (min) (ACUTE ONLY): 19 min   Charges:   PT Evaluation $PT Eval Moderate Complexity: 1 Mod          Seth Higginbotham B. Beverely Risen PT, DPT Acute Rehabilitation Services Pager [phone] Office [phone]   Elon Alas Fleet 02/11/2018, 9:43 AM

## 2018-02-11 NOTE — Progress Notes (Signed)
Temp 100.1 prn Tylenol administered. Will continue to monitor.

## 2018-02-11 NOTE — Progress Notes (Addendum)
Progress Note  Patient Name: Dawn Gonzales Date of Encounter: 02/11/2018  Primary Cardiologist: Nona Dell, MD   Subjective   No chest pain and no SOB, feels well.   Inpatient Medications    Scheduled Meds: . aspirin EC  81 mg Oral Daily  . calcium carbonate  1,250 mg Oral Daily  . cholecalciferol  1,000 Units Oral Daily  . gabapentin  300 mg Oral TID  . heparin injection (subcutaneous)  5,000 Units Subcutaneous Q8H  . indomethacin  100 mg Rectal Once  . mouth rinse  15 mL Mouth Rinse BID  . multivitamin with minerals  1 tablet Oral Daily  . pantoprazole  40 mg Oral Daily  . polyethylene glycol  17 g Oral Daily  . pravastatin  20 mg Oral q1800  . sodium chloride flush  3 mL Intravenous Q12H  . sodium chloride flush  5 mL Intracatheter Q8H  . venlafaxine  37.5 mg Oral Daily  . vitamin C  250 mg Oral Daily   Continuous Infusions: . sodium chloride Stopped (02/11/18 0600)  . sodium chloride    . cefTRIAXone (ROCEPHIN)  IV 2 g (02/11/18 1207)   PRN Meds: sodium chloride, sodium chloride, acetaminophen **OR** acetaminophen, hydrALAZINE, HYDROmorphone (DILAUDID) injection, ondansetron (ZOFRAN) IV, phenol, sodium chloride flush   Vital Signs    Vitals:   02/10/18 1944 02/10/18 2120 02/11/18 0500 02/11/18 0501  BP: 111/69   128/69  Pulse: 78   84  Resp: 18   18  Temp: 98.9 F (37.2 C)   99.3 F (37.4 C)  TempSrc: Oral   Oral  SpO2: 98% 97%  91%  Weight:   126.4 kg   Height:        Intake/Output Summary (Last 24 hours) at 02/11/2018 1326 Last data filed at 02/11/2018 0930 Gross per 24 hour  Intake 1488 ml  Output 600 ml  Net 888 ml   Filed Weights   02/08/18 0500 02/10/18 0500 02/11/18 0500  Weight: 124.3 kg 127.4 kg 126.4 kg    Telemetry    SR no junct rhythm - Personally Reviewed  ECG    No new - Personally Reviewed  Physical Exam   GEN: No acute distress.   Neck: No JVD Cardiac: RRR, no murmurs, rubs, or gallops.  Respiratory:  Clear to auscultation bilaterally. GI: Soft, nontender, non-distended  MS: No edema; No deformity. Neuro:  Nonfocal  Psych: Normal affect   Labs    Chemistry Recent Labs  Lab 02/07/18 0513 02/08/18 0222 02/09/18 0111 02/10/18 0932 02/11/18 0146  NA 141 140 137 136 138  K 3.9 3.9 3.8 3.6 3.9  CL 105 102 101 100 101  CO2 30 31 29 29 30   GLUCOSE 101* 103* 135* 103* 106*  BUN 13 8 14 15 14   CREATININE 0.62 0.68 1.13* 0.86 0.80  CALCIUM 8.8* 9.0 8.3* 8.5* 8.7*  PROT 6.3* 5.9*  --  6.1*  --   ALBUMIN 3.3* 3.0*  --  2.6*  --   AST 15 18  --  16  --   ALT 11 11  --  17  --   ALKPHOS 62 63  --  79  --   BILITOT 0.6 0.5  --  0.7  --   GFRNONAA >60 >60 46* >60 >60  GFRAA >60 >60 53* >60 >60  ANIONGAP 6 7 7 7 7      Hematology Recent Labs  Lab 02/09/18 0111 02/10/18 0932 02/11/18 0146  WBC 17.7* 11.3* 8.5  RBC 3.60* 3.43* 3.37*  HGB 10.7* 10.1* 10.2*  HCT 35.2* 33.8* 32.8*  MCV 97.8 98.5 97.3  MCH 29.7 29.4 30.3  MCHC 30.4 29.9* 31.1  RDW 14.5 14.2 14.1  PLT 249 221 229    Cardiac Enzymes Recent Labs  Lab 02/06/18 1251 02/06/18 1819 02/06/18 2302 02/07/18 0513  TROPONINI <0.03 <0.03 <0.03 <0.03   No results for input(s): TROPIPOC in the last 168 hours.   BNPNo results for input(s): BNP, PROBNP in the last 168 hours.   DDimer No results for input(s): DDIMER in the last 168 hours.   Radiology    No results found.  Cardiac Studies   Echo 02/01/18 Study Conclusions  - Left ventricle: The cavity size was normal. Wall thickness was   increased in a pattern of severe LVH. Systolic function was   normal. The estimated ejection fraction was in the range of 55%   to 60%. Wall motion was normal; there were no regional wall   motion abnormalities. Doppler parameters are consistent with   abnormal left ventricular relaxation (grade 1 diastolic   dysfunction). Doppler parameters are consistent with high   ventricular filling pressure. - Aortic valve: Severely  calcified annulus. Trileaflet; severely   thickened leaflets. There was severe stenosis. Mean gradient (S):   51 mm Hg. VTI ratio of LVOT to aortic valve: 0.18. Valve area   (VTI): 0.67 cm^2. Valve area (Vmax): 0.76 cm^2. Valve area   (Vmean): 0.67 cm^2. - Mitral valve: Severely calcified annulus. The findings are   consistent with mild stenosis. Mean gradient (D): 3 mm Hg. Valve   area by pressure half-time: 2.18 cm^2. Valve area by continuity   equation (using LVOT flow): 1.8 cm^2. - Left atrium: The atrium was moderately dilated. - Atrial septum: There was increased thickness of the septum,   consistent with lipomatous hypertrophy. No defect or patent   foramen ovale was identified.   Patient Profile     77 y.o. female with a history of long-standing heart murmur and apparent aortic stenosis, SBE 2017 while living in [facility], right bundle branch block, hyperlipidemia, neuropathy, and depression-we are seeing for cardiac pre-op eval. For choledocholithiasis. Consult was done at [facility]  Per Dr. Diona Browner "Risk is not so high as to preclude undergoing ERCP.  Whether or not a percutaneous drain versus open cholecystectomy needs to be pursued can be further contemplated.  This is not a situation where she would be considered for TAVR simply to "clear" her for undergoing gallbladder surgery.  She would be at increased risk for infection, and although severe aortic stenosis does increase perioperative surgical risk, it is typically not a situation where surgery would never be considered.  Careful attention needs to be paid to hemodynamics, would also keep her on telemetry to watch her rhythm.  Our cardiology service can follow her in consultation as well."   Assessment & Plan    Principal Problem:   Epigastric pain Active Problems:   N&V (nausea and vomiting)   Calculus of gallbladder with biliary obstruction but without cholecystitis  Acute cholecystitis complicated with  choledocholithiasisand sepsis (not present on admission) gram positive cocci.sp Endoscopic [place] and ERCP with stone extraction and biliary sphincterectomy.  cholecystostomy in place.     Preoperative cardiovascular examination   Acute cholecystitis   Severe aortic stenosis-- plan follow up with Dr. Diona Browner in 4 weeks and decide about surgery or further work up   Choledocholithiasis   Cholecystitis, acute with cholelithiasis   Junctional  bradycardia  Related to OSA  - none on CPAP, will need outpt work up and if possible CPAP          For questions or updates, please contact [facility] Please consult [email or web address] for contact info under        Signed, Nada Boozer, NP  02/11/2018, 1:26 PM    ---------------------------------------------------------------------------------------------------   History and all data above reviewed.  Patient examined.  I agree with the findings as above.  Kaelah Hayashi is doing well today and has no active cardiac concerns. We discussed the timing of surgery and potential valve intervention, and how this may need to be determined as an outpatient.  Exam per L. Ingold NP  All available labs, radiology testing, previous records reviewed. Agree with documented assessment and plan of my colleague as stated above with the following additions or changes:  Principal Problem:   Epigastric pain Active Problems:   N&V (nausea and vomiting)   Calculus of gallbladder with biliary obstruction but without cholecystitis   Preoperative cardiovascular examination   Acute cholecystitis   Severe aortic stenosis   Choledocholithiasis   Cholecystitis, acute with cholelithiasis   Junctional bradycardia    Plan: There is no current indication for urgent TAVR, and therefore we will have the patient follow up with Dr. Diona Browner as an outpatient. It may be reasonable for him to see the patient in about 4 weeks, prior to any planned abdominal surgery to  optimize prior to procedure.   No other changes to current cardiovascular care, not currently in heart failure.   Cardiology will sign off for now, however if clinical status changes, we would be happy to see her in hospital again to assist with management.   [facility] will sign off.   Medication Recommendations:  No change Other recommendations (labs, testing, etc):  n/a Follow up as an outpatient:  With cardiology in 2-4 weeks depending on surgical follow up, to optimize prior to surgery.    Parke Poisson, MD [facility] 7:43 PM  02/11/2018

## 2018-02-11 NOTE — Progress Notes (Signed)
Nutrition Brief Note  RD consulted for assessment of nutritional needs/ status.   Wt Readings from Last 15 Encounters:  02/11/18 126.4 kg  01/21/18 124.3 kg   Dawn Gonzales  is a 77 y.o. female,w depression, hyperlipidemia, severe aortic stenosis apparently c/o nausea and also epigastric pain starting this AM. Pt denies fever, chills, cp, palp, sob, emesis, diarrhea, brbpr, dysuria, hematuria.  Pt took tums without relief and presented to ED for evaluation.   Pt admitted with epigastric pain.   10/11- s/p ERCP with sphincterectomy and stone extraction for choledocholithiasis  10/12- s/p perc cholecystotomy   Case discussed with RN, who reports pt is progressing well. She did not identify any nutritional concerns or needs on this shift.   Spoke with pt at bedside, who is in good spirits today. Her only complaint is mild pain for perc chole drain site.   Pt reports she has a good appetite. She was consuming 3 meals per day PTA (Breakfast: oatmeal OR bacon and eggs; Lunch: frozen TV dinner, oodles of noodles OR sandwich; Dinner: meat and vegetable). Observed breakfast tray- pt consumed 100% of orange juice, 50% of french toast, and sausage, and 75% of her oatmeal.   Pt unsure of weight loss. She reports UBE of around 270-274#. She does not feel like she has lost weight, however, is unsure of weight readings as she does not know how to interpret wt readings in kilograms. Per wt hx, pt with no weight loss.   Pt denies any changes in mobility, but has been "slower" during hospitalization.   Nutrition-Focused physical exam completed. Findings are no fat depletion, no muscle depletion, and moderate edema.   Albumin has a half-life of 21 days and is strongly affected by stress response and inflammatory process, therefore, do not expect to see an improvement in this lab value during acute hospitalization. When a patient presents with low albumin, it is likely skewed due to the acute  inflammatory response.  Unless it is suspected that patient had poor PO intake or malnutrition prior to admission, then RD should not be consulted solely for low albumin. Note that low albumin is no longer used to diagnose malnutrition; [place] uses the new malnutrition guidelines published by the American Society for Parenteral and Enteral Nutrition (A.S.P.E.N.) and the Academy of Nutrition and Dietetics (AND).    Body mass index is 44.98 kg/m. Patient meets criteria for extreme obesity, class III based on current BMI. Weight loss is not an ideal goal for an acute hospitalization. If further work-up for obesity is desired, consider referral to outpatient bariatric service and/or [facility].   Current diet order is heart healthy, patient is consuming approximately 75-100% of meals at this time. Labs and medications reviewed.   No nutrition interventions warranted at this time. If nutrition issues arise, please consult RD.   Orlen Leedy A. Mayford Knife, RD, LDN, CDE Pager: [phone] After hours Pager: [phone]

## 2018-02-12 DIAGNOSIS — K8043 Calculus of bile duct with acute cholecystitis with obstruction: Secondary | ICD-10-CM

## 2018-02-12 LAB — CBC WITH DIFFERENTIAL/PLATELET
Abs Immature Granulocytes: 0.02 10*3/uL (ref 0.00–0.07)
Basophils Absolute: 0 10*3/uL (ref 0.0–0.1)
Basophils Relative: 0 %
Eosinophils Absolute: 0.3 10*3/uL (ref 0.0–0.5)
Eosinophils Relative: 5 %
HCT: 34.4 % — ABNORMAL LOW (ref 36.0–46.0)
Hemoglobin: 10.5 g/dL — ABNORMAL LOW (ref 12.0–15.0)
Immature Granulocytes: 0 %
Lymphocytes Relative: 26 %
Lymphs Abs: 1.6 10*3/uL (ref 0.7–4.0)
MCH: 29.5 pg (ref 26.0–34.0)
MCHC: 30.5 g/dL (ref 30.0–36.0)
MCV: 96.6 fL (ref 80.0–100.0)
Monocytes Absolute: 1 10*3/uL (ref 0.1–1.0)
Monocytes Relative: 16 %
Neutro Abs: 3.3 10*3/uL (ref 1.7–7.7)
Neutrophils Relative %: 53 %
Platelets: 226 10*3/uL (ref 150–400)
RBC: 3.56 MIL/uL — ABNORMAL LOW (ref 3.87–5.11)
RDW: 14 % (ref 11.5–15.5)
WBC: 6.2 10*3/uL (ref 4.0–10.5)
nRBC: 0 % (ref 0.0–0.2)

## 2018-02-12 MED ORDER — AMOXICILLIN-POT CLAVULANATE 875-125 MG PO TABS: 1.0000 | ORAL_TABLET | Freq: Two times a day (BID) | ORAL | 0 refills | Status: AC

## 2018-02-12 MED ORDER — AMOXICILLIN-POT CLAVULANATE 875-125 MG PO TABS
1.0000 | ORAL_TABLET | Freq: Two times a day (BID) | ORAL | Status: DC
  Administered 2018-02-12 – 2018-02-13 (×3): 1 via ORAL
  Filled 2018-02-12 (×3): qty 1

## 2018-02-12 MED ORDER — ACETAMINOPHEN 325 MG PO TABS: 650.0000 mg | ORAL_TABLET | Freq: Four times a day (QID) | ORAL | 0 refills | Status: DC | PRN

## 2018-02-12 NOTE — Care Management Important Message (Signed)
Important Message  Patient Details  Name: Dawn Gonzales MRN: [id number] Date of Birth: June 06, 1940   Medicare Important Message Given:  Yes    Elianis Fischbach Stefan Church 02/12/2018, 3:59 PM

## 2018-02-12 NOTE — Progress Notes (Signed)
Loss of IV site. MD aware, no IV site needed per MD.

## 2018-02-12 NOTE — Final Consult Note (Signed)
Consultant Final Sign-Off Note    Assessment/Final recommendations  Dawn Gonzales is a 77 y.o. female followed by me for cholecystitis S/P perc chole drain.    Wound care (if applicable): per IR for perc chole drain   Diet at discharge: per primary team   Activity at discharge: per primary team   Follow-up appointment:  6 weeks with Dr. Dwain Sarna to discuss possible cholecystectomy pending cardiology recommendations/risk stratification    Pending results:  Unresulted Labs (From admission, onward)   None       Medication recommendations: okay to transition to PO antibiotics for a total of 2 weeks    Other recommendations:    Thank you for allowing [place] to participate in the care of your patient!  Please consult [place] again if you have further needs for your patient.  [name] 02/12/2018 8:22 AM    Subjective   CC: cholecystitis  Pt states pain is better today. She is tolerating her diet. No issues overnight.   Objective  Vital signs in last 24 hours: Temp:  [97.9 F (36.6 C)-100.1 F (37.8 C)] 97.9 F (36.6 C) (10/15 0601) Pulse Rate:  [65-84] 69 (10/15 0601) Resp:  [18-24] 20 (10/15 0601) BP: (127-150)/(67-81) 143/72 (10/15 0601) SpO2:  [88 %-98 %] 93 % (10/15 0601)  PE: Gen:  Alert, NAD, pleasant, cooperative Pulm:  Rate and effort normal Abd: Soft, obese, ND, +BS, mild RUQ TTP, no guarding, no peritonitis, RUQ drain with serosanguinous drainage Skin: no rashes noted, warm and dry   Pertinent labs and Studies: Recent Labs    02/10/18 0932 02/11/18 0146 02/12/18 0635  WBC 11.3* 8.5 6.2  HGB 10.1* 10.2* 10.5*  HCT 33.8* 32.8* 34.4*   BMET Recent Labs    02/10/18 0932 02/11/18 0146  NA 136 138  K 3.6 3.9  CL 100 101  CO2 29 30  GLUCOSE 103* 106*  BUN 15 14  CREATININE 0.86 0.80  CALCIUM 8.5* 8.7*   No results for input(s): LABURIN in the last 72 hours. Results for orders placed or performed during the hospital encounter of  02/06/18  Culture, blood (routine x 2)     Status: None (Preliminary result)   Collection Time: 02/08/18  4:35 PM  Result Value Ref Range Status   Specimen Description BLOOD BLOOD LEFT HAND  Final   Special Requests   Final    BOTTLES DRAWN AEROBIC AND ANAEROBIC Blood Culture results may not be optimal due to an inadequate volume of blood received in culture bottles   Culture   Final    NO GROWTH 2 DAYS Performed at [facility], [address] [place], [place] [address]    Report Status PENDING  Incomplete  Culture, blood (routine x 2)     Status: None (Preliminary result)   Collection Time: 02/08/18  4:55 PM  Result Value Ref Range Status   Specimen Description BLOOD BLOOD RIGHT HAND  Final   Special Requests   Final    BOTTLES DRAWN AEROBIC AND ANAEROBIC Blood Culture adequate volume   Culture   Final    NO GROWTH 2 DAYS Performed at [facility], [address] [place], [place] [address]    Report Status PENDING  Incomplete  Aerobic/Anaerobic Culture (surgical/deep wound)     Status: None (Preliminary result)   Collection Time: 02/09/18 12:05 PM  Result Value Ref Range Status   Specimen Description BILE  Final   Special Requests Normal  Final  Gram Stain   Final    RARE WBC PRESENT, PREDOMINANTLY PMN RARE GRAM POSITIVE COCCI    Culture   Final    CULTURE REINCUBATED FOR BETTER GROWTH Performed at [facility], [address] [place], [place] [address]    Report Status PENDING  Incomplete    Imaging: No results found.

## 2018-02-12 NOTE — Social Work (Addendum)
CSW acknowledging that pt has preference for SNF placement.  Initiated English as a second language teacher- await confirmation for authorization.   3:00pm- presented pt with packet of SNF offers, she is requesting for additional referrals to be sent to [place] area SNFs due to home being in [facility]. Pt requested CSW call daughter. Daughter attempted to be called at [phone], unable to leave message will attempt call again later this afternoon.   Octavio Graves, MSW, [facility] Clinical Social Work [phone]

## 2018-02-12 NOTE — Progress Notes (Signed)
Referring Physician(s): Dr. Daphine Deutscher  Supervising Physician: Dr. Fredia Sorrow  Patient Status:  [facility] - In-pt  Chief Complaint: Cholecystitis, choledocholithiasis  Subjective: Resting comfortably.  Pain improved.  Tolerating diet  Allergies: Patient has no known allergies.  Medications:  Current Facility-Administered Medications:  .  0.9 %  sodium chloride infusion, 250 mL, Intravenous, PRN, Pearson Grippe, MD, Stopped at 02/11/18 0600 .  0.9 %  sodium chloride infusion, , Intravenous, PRN, Pearson Grippe, MD .  acetaminophen (TYLENOL) tablet 650 mg, 650 mg, Oral, Q6H PRN, 650 mg at 02/11/18 1538 **OR** acetaminophen (TYLENOL) suppository 650 mg, 650 mg, Rectal, Q6H PRN, Pearson Grippe, MD .  amoxicillin-clavulanate (AUGMENTIN) 875-125 MG per tablet 1 tablet, 1 tablet, Oral, Q12H, Arrien, York Ram, MD, 1 tablet at 02/12/18 1040 .  aspirin EC tablet 81 mg, 81 mg, Oral, Daily, Pearson Grippe, MD, 81 mg at 02/12/18 1041 .  calcium carbonate (OS-CAL - dosed in mg of elemental calcium) tablet 1,250 mg, 1,250 mg, Oral, Daily, Pearson Grippe, MD, 1,250 mg at 02/12/18 1041 .  cholecalciferol (VITAMIN D) tablet 1,000 Units, 1,000 Units, Oral, Daily, Pearson Grippe, MD, 1,000 Units at 02/12/18 1041 .  gabapentin (NEURONTIN) capsule 300 mg, 300 mg, Oral, TID, Pearson Grippe, MD, 300 mg at 02/12/18 1041 .  heparin injection 5,000 Units, 5,000 Units, Subcutaneous, Q8H, Hoyt Koch, PA, 5,000 Units at 02/12/18 0549 .  indomethacin (INDOCIN) 50 MG suppository 100 mg, 100 mg, Rectal, Once, Dianah Field, PA-C .  MEDLINE mouth rinse, 15 mL, Mouth Rinse, BID, Pearson Grippe, MD, 15 mL at 02/12/18 1043 .  multivitamin with minerals tablet 1 tablet, 1 tablet, Oral, Daily, Pearson Grippe, MD, 1 tablet at 02/12/18 1041 .  ondansetron (ZOFRAN) injection 4 mg, 4 mg, Intravenous, Q6H PRN, Pearson Grippe, MD .  pantoprazole (PROTONIX) EC tablet 40 mg, 40 mg, Oral, Daily, Dianah Field, PA-C, 40 mg at 02/12/18 1040 .   phenol (CHLORASEPTIC) mouth spray 1 spray, 1 spray, Mouth/Throat, PRN, Arrien, York Ram, MD, 1 spray at 02/09/18 1320 .  polyethylene glycol (MIRALAX / GLYCOLAX) packet 17 g, 17 g, Oral, Daily, Dianah Field, PA-C, 17 g at 02/12/18 1042 .  pravastatin (PRAVACHOL) tablet 20 mg, 20 mg, Oral, q1800, Pearson Grippe, MD, 20 mg at 02/11/18 1837 .  sodium chloride flush (NS) 0.9 % injection 3 mL, 3 mL, Intravenous, Q12H, Pearson Grippe, MD, 3 mL at 02/12/18 1043 .  sodium chloride flush (NS) 0.9 % injection 3 mL, 3 mL, Intravenous, PRN, Pearson Grippe, MD .  sodium chloride flush (NS) 0.9 % injection 5 mL, 5 mL, Intracatheter, Q8H, Richarda Overlie, MD, 5 mL at 02/12/18 0549 .  venlafaxine (EFFEXOR) tablet 37.5 mg, 37.5 mg, Oral, Daily, Pearson Grippe, MD, 37.5 mg at 02/12/18 1040 .  vitamin C (ASCORBIC ACID) tablet 250 mg, 250 mg, Oral, Daily, Memon, Durward Mallard, MD, 250 mg at 02/12/18 1040    Vital Signs: BP (!) 143/72 (BP Location: Left Arm)   Pulse 69   Temp 97.9 F (36.6 C) (Oral)   Resp 20   Ht 5\' 6"  (1.676 m)   Wt 126.4 kg   SpO2 93%   BMI 44.98 kg/m   Physical Exam  NAD, alert Abdomen: soft, non-distended, non-tender.  RUQ drain in place.  Insertion site c/d/i, very small amount of serosanguinous fluid present.   Imaging: Ir Perc Cholecystostomy  Result Date: 02/09/2018 INDICATION: 77 year old with choledocholithiasis, cholelithiasis and concern for cholecystitis. Prior cholecystostomy tube. EXAM: IMAGE GUIDED PERCUTANEOUS CHOLECYSTOSTOMY  TUBE PLACEMENT MEDICATIONS: Inpatient on antibiotics ANESTHESIA/SEDATION: Moderate (conscious) sedation was employed during this procedure. A total of Versed 1 mg and Fentanyl 50 mcg was administered intravenously. Moderate Sedation Time: 22 minutes. The patient's level of consciousness and vital signs were monitored continuously by radiology nursing throughout the procedure under my direct supervision. FLUOROSCOPY TIME:  None COMPLICATIONS: None immediate.  PROCEDURE: Informed written consent was obtained from the patient after a thorough discussion of the procedural risks, benefits and alternatives. All questions were addressed. A timeout was performed prior to the initiation of the procedure. Patient was placed supine on the CT scanner. Images through the upper abdomen were obtained. Abdomen was also evaluated with ultrasound. Abnormal gallbladder was identified in the anterior right upper abdomen. The right upper abdomen was prepped and draped in sterile fashion. Maximal barrier sterile technique was utilized including caps, mask, sterile gowns, sterile gloves, sterile drape, hand hygiene and skin antiseptic. Skin was anesthetized with 1% lidocaine. 18 gauge trocar needle was directed into the abnormal gallbladder using ultrasound and CT guidance. Needle placement was difficult due to large gallstone. Eventually, wire was confirmed within the gallbladder. The tract was dilated to accommodate a 10.2 [place] multipurpose drain. Approximately 5 mL of brown purulent looking fluid was aspirated. CT images confirmed drain placement in the gallbladder. The catheter was sutured to skin and attached to a gravity bag. FINDINGS: Stone filled gallbladder with surrounding inflammatory changes. Drain was successfully placed within this abnormal gallbladder. 5 mL of dark purulent looking fluid was removed and sent for culture. IMPRESSION: Successful image guided percutaneous cholecystostomy tube placement. Gallbladder is nondistended and stone filled. Small amount of fluid was removed from the gallbladder and sent for culture. Electronically Signed   By: Richarda Overlie M.D.   On: 02/09/2018 13:36   Ct Abd Limited W/o Cm  Result Date: 02/09/2018 INDICATION: 77 year old with choledocholithiasis, cholelithiasis and concern for cholecystitis. Prior cholecystostomy tube. EXAM: IMAGE GUIDED PERCUTANEOUS CHOLECYSTOSTOMY TUBE PLACEMENT MEDICATIONS: Inpatient on antibiotics  ANESTHESIA/SEDATION: Moderate (conscious) sedation was employed during this procedure. A total of Versed 1 mg and Fentanyl 50 mcg was administered intravenously. Moderate Sedation Time: 22 minutes. The patient's level of consciousness and vital signs were monitored continuously by radiology nursing throughout the procedure under my direct supervision. FLUOROSCOPY TIME:  None COMPLICATIONS: None immediate. PROCEDURE: Informed written consent was obtained from the patient after a thorough discussion of the procedural risks, benefits and alternatives. All questions were addressed. A timeout was performed prior to the initiation of the procedure. Patient was placed supine on the CT scanner. Images through the upper abdomen were obtained. Abdomen was also evaluated with ultrasound. Abnormal gallbladder was identified in the anterior right upper abdomen. The right upper abdomen was prepped and draped in sterile fashion. Maximal barrier sterile technique was utilized including caps, mask, sterile gowns, sterile gloves, sterile drape, hand hygiene and skin antiseptic. Skin was anesthetized with 1% lidocaine. 18 gauge trocar needle was directed into the abnormal gallbladder using ultrasound and CT guidance. Needle placement was difficult due to large gallstone. Eventually, wire was confirmed within the gallbladder. The tract was dilated to accommodate a 10.2 [place] multipurpose drain. Approximately 5 mL of brown purulent looking fluid was aspirated. CT images confirmed drain placement in the gallbladder. The catheter was sutured to skin and attached to a gravity bag. FINDINGS: Stone filled gallbladder with surrounding inflammatory changes. Drain was successfully placed within this abnormal gallbladder. 5 mL of dark purulent looking fluid was removed and sent for culture. IMPRESSION:  Successful image guided percutaneous cholecystostomy tube placement. Gallbladder is nondistended and stone filled. Small amount of fluid was  removed from the gallbladder and sent for culture. Electronically Signed   By: Richarda Overlie M.D.   On: 02/09/2018 13:36   Dg Ercp Biliary & Pancreatic Ducts  Result Date: 02/08/2018 CLINICAL DATA:  Choledocholithiasis EXAM: ERCP TECHNIQUE: Multiple spot images obtained with the fluoroscopic device and submitted for interpretation post-procedure. FLUOROSCOPY TIME:  Fluoroscopy Time:  2 minutes and 34 seconds Radiation Exposure Index (if provided by the fluoroscopic device): Number of Acquired Spot Images: 0 COMPARISON:  None. FINDINGS: Several images demonstrate cannulation of the common bile duct and contrast filling the biliary tree. Balloon stone retrieval is documented. IMPRESSION: See above. These images were submitted for radiologic interpretation only. Please see the procedural report for the amount of contrast and the fluoroscopy time utilized. Electronically Signed   By: Jolaine Click M.D.   On: 02/08/2018 15:59    Labs:  CBC: Recent Labs    02/09/18 0111 02/10/18 0932 02/11/18 0146 02/12/18 0635  WBC 17.7* 11.3* 8.5 6.2  HGB 10.7* 10.1* 10.2* 10.5*  HCT 35.2* 33.8* 32.8* 34.4*  PLT 249 221 229 226    COAGS: Recent Labs    02/07/18 1705  INR 1.07    BMP: Recent Labs    02/08/18 0222 02/09/18 0111 02/10/18 0932 02/11/18 0146  NA 140 137 136 138  K 3.9 3.8 3.6 3.9  CL 102 101 100 101  CO2 31 29 29 30   GLUCOSE 103* 135* 103* 106*  BUN 8 14 15 14   CALCIUM 9.0 8.3* 8.5* 8.7*  CREATININE 0.68 1.13* 0.86 0.80  GFRNONAA >60 46* >60 >60  GFRAA >60 53* >60 >60    LIVER FUNCTION TESTS: Recent Labs    02/06/18 1251 02/07/18 0513 02/08/18 0222 02/10/18 0932  BILITOT 0.6 0.6 0.5 0.7  AST 17 15 18 16   ALT 12 11 11 17   ALKPHOS 69 62 63 79  PROT 6.8 6.3* 5.9* 6.1*  ALBUMIN 3.6 3.3* 3.0* 2.6*    Assessment and Plan: Acute cholecystitis s/p drain placement 02/09/18 by Dr. Lowella Dandy Patient s/p cholecystostomy tube placement yesterday.  She has had perc drains in the  past.  She is considering surgical intervention; at increased risk per Dr. Daphine Deutscher note.  Drain to remain in place for now.  Continue current management.  IR to follow.   Electronically Signed: Brayton El, PA-C 02/12/2018, 10:54 AM   I spent a total of 15 Minutes at the the patient's bedside AND on the patient's hospital floor or unit, greater than 50% of which was counseling/coordinating care for cholecystits.

## 2018-02-12 NOTE — Plan of Care (Signed)
  Problem: Education: Goal: Knowledge of General Education information will improve Description: Including pain rating scale, medication(s)/side effects and non-pharmacologic comfort measures Outcome: Progressing   Problem: Clinical Measurements: Goal: Ability to maintain clinical measurements within normal limits will improve Outcome: Progressing Goal: Respiratory complications will improve Outcome: Progressing   

## 2018-02-12 NOTE — Discharge Summary (Signed)
Physician Discharge Summary  Dawn Gonzales UJW:[id number] DOB: 04/05/1941 DOA: 02/06/2018  PCP: Benita Stabile, MD  Admit date: 02/06/2018 Discharge date: 02/12/2018  Admitted From: Home  Disposition:  SNF   Recommendations for Outpatient Follow-up and new medication changes:  1. Follow up with Dr. Margo Aye in 7 days.  2. Follow-up in 6 weeks with Dr. Dwain Sarna from surgery. 3. Follow-up with Dr. Diona Browner  from cardiology in 4 weeks.  4. Continue cholecystostomy tube/ drain care per protocol.  5. Patient will need cholecystectomy, prior cardiology and surgical preop evaluation.  6. No urgent indication for TAVR.  7. Last dose of antibiotic (Augmentin) October 23th.  8. Please continue CPAP at night at the skilled nursing facility, patient will need outpatient formal sleep study.  Home Health: no   Equipment/Devices: stable    Discharge Condition: stable  CODE STATUS: full   Diet recommendation:  Heart healthy   Brief/Interim Summary: 77 year old female who presented with abdominal pain. She does have significant past medical history for severe aortic stenosis, depression, obesity anddyslipidemia. Patient reported severe epigastric abdominal pain associated with nausea.On herinitial physical examination blood pressure 147/87, heart rate 66, temperature 98.2, respiratory rate 18, oxygen saturation 91%. Moist mucous membranes, lungs clear to auscultation bilaterally, heart S1-S2 present and rhythmic, bowel sounds were positive, abdomen soft, buttender on the right side, negative Murphy sign, no lower extremity edema.Sodium 139, potassium 3.8, chloride 105, bicarb 26, glucose 108, BUN 18, creatinine 0.6, white count 9.2, hemoglobin 11.7, hematocrit 38.5, platelets 255,urinalysis negative for infection,chest x-ray with no infiltrates, right hemidiaphragm elevation,abdominal ultrasound with cholelithiasis, moderate gallbladder wall thickeningandcommon bile duct dilatation.EKG sinus  rhythm, left axis deviation, left anterior fascicular block, rightbundlebranch block.  Patient was admitted to the hospitalwith theworking diagnosis of abdominal pain due to cholecystitis complicated by common bile ductobstruction.   1.  Acute cholecystitis complicated by choledocholithiasis and sepsis, (sepsis not present on admission), gram-positive cocci.  Patient was admitted to [facility], she received broad-spectrum IV antibiotic therapy, intravenous fluids and IV analgesics.  Further work-up with MRCP showed cholelithiasis with gallbladder wall thickening, and biliary duct dilatation with a subtle 7 mm distal common bile duct stone. She was kept nothing by mouth and surgery was consulted.  She was found to have a high cardiovascular risk for abdominal surgery and transferred to [facility] for further evaluation.  Patient underwent endoscopic ultrasonography and ERCP with successful stone extraction and biliary sphincterotomy.   Her peak white cell count was 17.7, at discharge is down to 6.2, she has remained afebrile, billiary culture grew gram-positive cocci, antibiotics have been de-escalated to oral Augmentin to finished October 23, to complete 2 weeks of effective antibiotic therapy.   This is patient's second episode of cholecystitis requiring a cholecystostomy tube, this time patient will require cholecystectomy, she will need a preop evaluation by cardiology and surgery.  2.  Severe aortic stenosis.  Patient does have a preserved LV systolic function 55 to 60%.  She has remained asymptomatic, but note that her physical activity is very limited, she uses mainly a wheelchair for mobilization.  Currently no angina, heart failure symptoms or history of syncope.  Her preoperative cardiovascular risk is elevated, with a revised cardiac risk index of 2.  Likely her aortic stenosis is not prohibitive for abdominal surgical intervention, but she will need a close hemodynamic  preoperative and perioperative monitoring.  No indication for urgent TAVR at this point.   3.  Obstructive sleep apnea, complicated by sinus bradycardia and  sinus pauses.  She was placed on telemetry monitor showing brady- arrhythmia with sinus bradycardia and sinus pauses which improved after placing patient on CPAP.  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  4.  Dyslipidemia.  Continue pravastatin.  5.  Morbid obesity.  Calculated BMI 43, this implies further increased postoperative pulmonary complications. Follow as outpatient.   Discharge Diagnoses:  Principal Problem:   Epigastric pain Active Problems:   N&V (nausea and vomiting)   Calculus of gallbladder with biliary obstruction but without cholecystitis   Preoperative cardiovascular examination   Acute cholecystitis   Severe aortic stenosis   Choledocholithiasis   Cholecystitis, acute with cholelithiasis   Junctional bradycardia    Discharge Instructions   Allergies as of 02/12/2018   No Known Allergies     Medication List    TAKE these medications   acetaminophen 325 MG tablet Commonly known as:  TYLENOL Take 2 tablets (650 mg total) by mouth every 6 (six) hours as needed for mild pain (or Fever >/= 101).   amoxicillin-clavulanate 875-125 MG tablet Commonly known as:  AUGMENTIN Take 1 tablet by mouth every 12 (twelve) hours for 7 days.   aspirin EC 81 MG tablet Take 81 mg by mouth daily.   CALCIUM-CARB 600 PO Take 600 mg by mouth daily.   cholecalciferol 1000 units tablet Commonly known as:  VITAMIN D Take 1,000 Units by mouth daily.   ferrous sulfate 325 (65 FE) MG tablet Take 325 mg by mouth daily with breakfast.   gabapentin 300 MG capsule Commonly known as:  NEURONTIN Take 300 mg by mouth 3 (three) times daily.   multivitamin with minerals Tabs tablet Take 1 tablet by mouth daily.   nystatin ointment Commonly known as:  MYCOSTATIN Apply 1 application topically 2 (two) times daily as  needed.   pravastatin 20 MG tablet Commonly known as:  PRAVACHOL Take 20 mg by mouth daily.   venlafaxine 37.5 MG tablet Commonly known as:  EFFEXOR Take 37.5 mg by mouth daily.   vitamin C 100 MG tablet Take 100 mg by mouth daily.      Follow-up Information    Jonelle Sidle, MD Follow up on 03/06/2018.   Specialty:  Cardiology Why:  at 1:00 PM Contact information: [address] [place] [address] [phone]        Emelia Loron, MD. Schedule an appointment as soon as possible for a visit in 6 week(s).   Specialty:  General Surgery Why:  to discuss possible surgery to remove gallbladder.  Contact information: [address] [place] [address] [phone]          No Known Allergies  Consultations:  Surgery   GI   Cardiology    Procedures/Studies: Dg Chest 2 View  Result Date: 02/06/2018 CLINICAL DATA:  Three days of epigastric pain which worsened this morning. History of CHF, former smoker. EXAM: CHEST - 2 VIEW COMPARISON:  None. FINDINGS: The lungs are mildly hypoinflated. The lung markings are coarse in the retrocardiac region and are accentuated by hypoinflation. The cardiac silhouette is enlarged. The pulmonary vascularity is normal. There is calcification in the wall of the aortic arch. The bony thorax exhibits no acute abnormality. The bowel gas pattern in the upper abdomen is unremarkable. IMPRESSION: Cardiomegaly without pulmonary vascular congestion or pulmonary edema. Coarse lung markings in the retrocardiac region may reflect atelectasis or early pneumonia though I favor atelectasis. Thoracic aortic atherosclerosis. Electronically Signed   By: Onalee Hua  [place] M.D.   On: 02/06/2018 14:14   Mr 3d Recon At Scanner  Result Date: 02/06/2018 CLINICAL DATA:  Cholelithiasis. Epigastric pain for 3 days with nausea. History CHF. Prior "tummy tuck". EXAM: MRI ABDOMEN WITHOUT AND WITH CONTRAST (INCLUDING MRCP) TECHNIQUE: Multiplanar  multisequence MR imaging of the abdomen was performed both before and after the administration of intravenous contrast. Heavily T2-weighted images of the biliary and pancreatic ducts were obtained, and three-dimensional MRCP images were rendered by post processing. CONTRAST:  10 cc Gadavist COMPARISON:  Ultrasound earlier today. FINDINGS: Mild to moderate degradation throughout secondary to patient body habitus and motion. Lower chest: Mild cardiomegaly, without pericardial or pleural effusion. Hepatobiliary: High left hepatic lobe cyst. Gallstones including at 3.0 cm. Gallbladder wall thickening, including at 10 mm on image 27/5. Mild intrahepatic biliary duct dilatation. The common duct measures 1.6 cm in the porta hepatis. 7 mm focus of T2 hypointensity in the distal common duct on images 15/3. This is felt to be confirmed on motion degraded dedicated MRCP image 92/9. Pancreas:  Normal, without mass or ductal dilatation. Spleen:  Normal in size, without focal abnormality. Adrenals/Urinary Tract: Normal adrenal glands. An upper pole left renal 2.0 cm lesion medially is consistent with a hemorrhagic/proteinaceous cyst. A lower pole right renal lesion measures 1.3 cm on image 10/3 and is not well evaluated on post-contrast images, including image 64/5007. Stomach/Bowel: Normal stomach and abdominal bowel loops. Vascular/Lymphatic: Normal caliber of the aorta and branch vessels. No abdominal adenopathy. Other:  No ascites. Musculoskeletal: Convex left lumbar spine curvature. IMPRESSION: 1. Mild to moderate motion degradation. 2. Cholelithiasis with gallbladder wall thickening, again suspicious for acute cholecystitis. 3. Biliary duct dilatation with a subtle 7 mm distal common duct stone. 4. Lower pole right renal lesion which is suboptimally evaluated, but most likely represents a complex cyst. Consider renal ultrasound follow-up at 1 year. 5. Left renal hemorrhagic/proteinaceous cyst. Electronically Signed   By:  Jeronimo Greaves M.D.   On: 02/06/2018 18:15   US Abdomen Limited  Result Date: 02/06/2018 CLINICAL DATA:  Acute right upper quadrant abdominal pain. EXAM: ULTRASOUND ABDOMEN LIMITED RIGHT UPPER QUADRANT COMPARISON:  None. FINDINGS: Gallbladder: Multiple gallstones are noted with the largest measuring 2.5 cm. Gallbladder wall thickening is noted at 7 mm. Positive sonographic Murphy's sign is noted. Sludge is noted within gallbladder lumen. Common bile duct: Diameter: 11 mm which is dilated and concerning for distal common bile duct obstruction. Liver: No focal lesion identified. Within normal limits in parenchymal echogenicity. Portal vein is patent on color Doppler imaging with normal direction of blood flow towards the liver. IMPRESSION: Cholelithiasis is noted with moderate gallbladder wall thickening and positive sonographic Murphy's sign concerning for acute cholecystitis. Common bile duct dilatation is noted concerning for distal common bile duct obstruction. Correlation with liver function tests and MRCP is recommended. Electronically Signed   By: Lupita Raider, M.D.   On: 02/06/2018 13:55   Ir Perc Cholecystostomy  Result Date: 02/09/2018 INDICATION: 77 year old with choledocholithiasis, cholelithiasis and concern for cholecystitis. Prior cholecystostomy tube. EXAM: IMAGE GUIDED PERCUTANEOUS CHOLECYSTOSTOMY TUBE PLACEMENT MEDICATIONS: Inpatient on antibiotics ANESTHESIA/SEDATION: Moderate (conscious) sedation was employed during this procedure. A total of Versed 1 mg and Fentanyl 50 mcg was administered intravenously. Moderate Sedation Time: 22 minutes. The patient's level of consciousness and vital signs were monitored continuously by radiology nursing throughout the procedure under my direct supervision. FLUOROSCOPY TIME:  None COMPLICATIONS: None immediate. PROCEDURE: Informed written consent was obtained from the patient after a thorough  discussion of the procedural risks, benefits and  alternatives. All questions were addressed. A timeout was performed prior to the initiation of the procedure. Patient was placed supine on the CT scanner. Images through the upper abdomen were obtained. Abdomen was also evaluated with ultrasound. Abnormal gallbladder was identified in the anterior right upper abdomen. The right upper abdomen was prepped and draped in sterile fashion. Maximal barrier sterile technique was utilized including caps, mask, sterile gowns, sterile gloves, sterile drape, hand hygiene and skin antiseptic. Skin was anesthetized with 1% lidocaine. 18 gauge trocar needle was directed into the abnormal gallbladder using ultrasound and CT guidance. Needle placement was difficult due to large gallstone. Eventually, wire was confirmed within the gallbladder. The tract was dilated to accommodate a 10.2 [place] multipurpose drain. Approximately 5 mL of brown purulent looking fluid was aspirated. CT images confirmed drain placement in the gallbladder. The catheter was sutured to skin and attached to a gravity bag. FINDINGS: Stone filled gallbladder with surrounding inflammatory changes. Drain was successfully placed within this abnormal gallbladder. 5 mL of dark purulent looking fluid was removed and sent for culture. IMPRESSION: Successful image guided percutaneous cholecystostomy tube placement. Gallbladder is nondistended and stone filled. Small amount of fluid was removed from the gallbladder and sent for culture. Electronically Signed   By: Richarda Overlie M.D.   On: 02/09/2018 13:36   Ct Abd Limited W/o Cm  Result Date: 02/09/2018 INDICATION: 77 year old with choledocholithiasis, cholelithiasis and concern for cholecystitis. Prior cholecystostomy tube. EXAM: IMAGE GUIDED PERCUTANEOUS CHOLECYSTOSTOMY TUBE PLACEMENT MEDICATIONS: Inpatient on antibiotics ANESTHESIA/SEDATION: Moderate (conscious) sedation was employed during this procedure. A total of Versed 1 mg and Fentanyl 50 mcg was administered  intravenously. Moderate Sedation Time: 22 minutes. The patient's level of consciousness and vital signs were monitored continuously by radiology nursing throughout the procedure under my direct supervision. FLUOROSCOPY TIME:  None COMPLICATIONS: None immediate. PROCEDURE: Informed written consent was obtained from the patient after a thorough discussion of the procedural risks, benefits and alternatives. All questions were addressed. A timeout was performed prior to the initiation of the procedure. Patient was placed supine on the CT scanner. Images through the upper abdomen were obtained. Abdomen was also evaluated with ultrasound. Abnormal gallbladder was identified in the anterior right upper abdomen. The right upper abdomen was prepped and draped in sterile fashion. Maximal barrier sterile technique was utilized including caps, mask, sterile gowns, sterile gloves, sterile drape, hand hygiene and skin antiseptic. Skin was anesthetized with 1% lidocaine. 18 gauge trocar needle was directed into the abnormal gallbladder using ultrasound and CT guidance. Needle placement was difficult due to large gallstone. Eventually, wire was confirmed within the gallbladder. The tract was dilated to accommodate a 10.2 [place] multipurpose drain. Approximately 5 mL of brown purulent looking fluid was aspirated. CT images confirmed drain placement in the gallbladder. The catheter was sutured to skin and attached to a gravity bag. FINDINGS: Stone filled gallbladder with surrounding inflammatory changes. Drain was successfully placed within this abnormal gallbladder. 5 mL of dark purulent looking fluid was removed and sent for culture. IMPRESSION: Successful image guided percutaneous cholecystostomy tube placement. Gallbladder is nondistended and stone filled. Small amount of fluid was removed from the gallbladder and sent for culture. Electronically Signed   By: Richarda Overlie M.D.   On: 02/09/2018 13:36   Dg Ercp Biliary & Pancreatic  Ducts  Result Date: 02/08/2018 CLINICAL DATA:  Choledocholithiasis EXAM: ERCP TECHNIQUE: Multiple spot images obtained with the fluoroscopic device and submitted for interpretation post-procedure.  FLUOROSCOPY TIME:  Fluoroscopy Time:  2 minutes and 34 seconds Radiation Exposure Index (if provided by the fluoroscopic device): Number of Acquired Spot Images: 0 COMPARISON:  None. FINDINGS: Several images demonstrate cannulation of the common bile duct and contrast filling the biliary tree. Balloon stone retrieval is documented. IMPRESSION: See above. These images were submitted for radiologic interpretation only. Please see the procedural report for the amount of contrast and the fluoroscopy time utilized. Electronically Signed   By: Jolaine Click M.D.   On: 02/08/2018 15:59   Mr Abdomen Mrcp Vivien Rossetti Contast  Result Date: 02/06/2018 CLINICAL DATA:  Cholelithiasis. Epigastric pain for 3 days with nausea. History CHF. Prior "tummy tuck". EXAM: MRI ABDOMEN WITHOUT AND WITH CONTRAST (INCLUDING MRCP) TECHNIQUE: Multiplanar multisequence MR imaging of the abdomen was performed both before and after the administration of intravenous contrast. Heavily T2-weighted images of the biliary and pancreatic ducts were obtained, and three-dimensional MRCP images were rendered by post processing. CONTRAST:  10 cc Gadavist COMPARISON:  Ultrasound earlier today. FINDINGS: Mild to moderate degradation throughout secondary to patient body habitus and motion. Lower chest: Mild cardiomegaly, without pericardial or pleural effusion. Hepatobiliary: High left hepatic lobe cyst. Gallstones including at 3.0 cm. Gallbladder wall thickening, including at 10 mm on image 27/5. Mild intrahepatic biliary duct dilatation. The common duct measures 1.6 cm in the porta hepatis. 7 mm focus of T2 hypointensity in the distal common duct on images 15/3. This is felt to be confirmed on motion degraded dedicated MRCP image 92/9. Pancreas:  Normal, without mass  or ductal dilatation. Spleen:  Normal in size, without focal abnormality. Adrenals/Urinary Tract: Normal adrenal glands. An upper pole left renal 2.0 cm lesion medially is consistent with a hemorrhagic/proteinaceous cyst. A lower pole right renal lesion measures 1.3 cm on image 10/3 and is not well evaluated on post-contrast images, including image 64/5007. Stomach/Bowel: Normal stomach and abdominal bowel loops. Vascular/Lymphatic: Normal caliber of the aorta and branch vessels. No abdominal adenopathy. Other:  No ascites. Musculoskeletal: Convex left lumbar spine curvature. IMPRESSION: 1. Mild to moderate motion degradation. 2. Cholelithiasis with gallbladder wall thickening, again suspicious for acute cholecystitis. 3. Biliary duct dilatation with a subtle 7 mm distal common duct stone. 4. Lower pole right renal lesion which is suboptimally evaluated, but most likely represents a complex cyst. Consider renal ultrasound follow-up at 1 year. 5. Left renal hemorrhagic/proteinaceous cyst. Electronically Signed   By: Jeronimo Greaves M.D.   On: 02/06/2018 18:15       Subjective: Patient is feeling better, no nausea or vomiting, no dyspnea or chest pain, positive stable abdominal discomfort improved with analgesics.   Discharge Exam: Vitals:   02/11/18 2135 02/12/18 0601  BP: 127/67 (!) 143/72  Pulse: 65 69  Resp: (!) 24 20  Temp: 98.8 F (37.1 C) 97.9 F (36.6 C)  SpO2: 96% 93%   Vitals:   02/11/18 2128 02/11/18 2133 02/11/18 2135 02/12/18 0601  BP:   127/67 (!) 143/72  Pulse: 68  65 69  Resp: 18  (!) 24 20  Temp:   98.8 F (37.1 C) 97.9 F (36.6 C)  TempSrc:   Oral Oral  SpO2: 94% 98% 96% 93%  Weight:      Height:        General: Not in pain or dyspnea  Neurology: Awake and alert, non focal  E ENT: no pallor, no icterus, oral mucosa moist Cardiovascular: No JVD. S1-S2 present, rhythmic, no gallops, rubs, or murmurs. No lower extremity edema.  Pulmonary: positive breath sounds  bilaterally, adequate air movement, no wheezing, rhonchi or rales. Gastrointestinal. Abdomen protuberant with no organomegaly, non tender, no rebound or guarding. Positive drain at the right upper quadrant, serosanguinous drainage.  Skin. No rashes Musculoskeletal: no joint deformities   The results of significant diagnostics from this hospitalization (including imaging, microbiology, ancillary and laboratory) are listed below for reference.     Microbiology: Recent Results (from the past 240 hour(s))  Culture, blood (routine x 2)     Status: None (Preliminary result)   Collection Time: 02/08/18  4:35 PM  Result Value Ref Range Status   Specimen Description BLOOD BLOOD LEFT HAND  Final   Special Requests   Final    BOTTLES DRAWN AEROBIC AND ANAEROBIC Blood Culture results may not be optimal due to an inadequate volume of blood received in culture bottles   Culture   Final    NO GROWTH 2 DAYS Performed at [facility], [address], [place], [place] [address]    Report Status PENDING  Incomplete  Culture, blood (routine x 2)     Status: None (Preliminary result)   Collection Time: 02/08/18  4:55 PM  Result Value Ref Range Status   Specimen Description BLOOD BLOOD RIGHT HAND  Final   Special Requests   Final    BOTTLES DRAWN AEROBIC AND ANAEROBIC Blood Culture adequate volume   Culture   Final    NO GROWTH 2 DAYS Performed at [facility], [address], [place], [place] [address]    Report Status PENDING  Incomplete  Aerobic/Anaerobic Culture (surgical/deep wound)     Status: None (Preliminary result)   Collection Time: 02/09/18 12:05 PM  Result Value Ref Range Status   Specimen Description BILE  Final   Special Requests Normal  Final   Gram Stain   Final    RARE WBC PRESENT, PREDOMINANTLY PMN RARE GRAM POSITIVE COCCI    Culture   Final    RARE STAPHYLOCOCCUS AUREUS SUSCEPTIBILITIES TO FOLLOW Performed at [facility], [address] [place],  [place] [address]    Report Status PENDING  Incomplete     Labs: BNP (last 3 results) No results for input(s): BNP in the last 8760 hours. Basic Metabolic Panel: Recent Labs  Lab 02/07/18 0513 02/08/18 0222 02/09/18 0111 02/10/18 0932 02/11/18 0146  NA 141 140 137 136 138  K 3.9 3.9 3.8 3.6 3.9  CL 105 102 101 100 101  CO2 30 31 29 29 30   GLUCOSE 101* 103* 135* 103* 106*  BUN 13 8 14 15 14   CREATININE 0.62 0.68 1.13* 0.86 0.80  CALCIUM 8.8* 9.0 8.3* 8.5* 8.7*   Liver Function Tests: Recent Labs  Lab 02/06/18 1251 02/07/18 0513 02/08/18 0222 02/10/18 0932  AST 17 15 18 16   ALT 12 11 11 17   ALKPHOS 69 62 63 79  BILITOT 0.6 0.6 0.5 0.7  PROT 6.8 6.3* 5.9* 6.1*  ALBUMIN 3.6 3.3* 3.0* 2.6*   Recent Labs  Lab 02/06/18 1251  LIPASE 23   No results for input(s): AMMONIA in the last 168 hours. CBC: Recent Labs  Lab 02/06/18 1251 02/07/18 0513 02/09/18 0111 02/10/18 0932 02/11/18 0146 02/12/18 0635  WBC 9.2 7.7 17.7* 11.3* 8.5 6.2  NEUTROABS 6.9  --  15.8*  --  6.4 3.3  HGB 11.7* 10.7* 10.7* 10.1* 10.2* 10.5*  HCT 38.5 34.5* 35.2* 33.8* 32.8* 34.4*  MCV 98.0 99.1 97.8 98.5 97.3 96.6  PLT  255 244 249 221 229 226   Cardiac Enzymes: Recent Labs  Lab 02/06/18 1251 02/06/18 1819 02/06/18 2302 02/07/18 0513  TROPONINI <0.03 <0.03 <0.03 <0.03   BNP: Invalid input(s): POCBNP CBG: Recent Labs  Lab 02/08/18 1022  GLUCAP 92   D-Dimer No results for input(s): DDIMER in the last 72 hours. Hgb A1c No results for input(s): HGBA1C in the last 72 hours. Lipid Profile No results for input(s): CHOL, HDL, LDLCALC, TRIG, CHOLHDL, LDLDIRECT in the last 72 hours. Thyroid function studies No results for input(s): TSH, T4TOTAL, T3FREE, THYROIDAB in the last 72 hours.  Invalid input(s): FREET3 Anemia work up No results for input(s): VITAMINB12, FOLATE, FERRITIN, TIBC, IRON, RETICCTPCT in the last 72 hours. Urinalysis    Component Value Date/Time   COLORURINE STRAW (A)  02/06/2018 1227   APPEARANCEUR CLEAR 02/06/2018 1227   LABSPEC 1.005 02/06/2018 1227   PHURINE 7.0 02/06/2018 1227   GLUCOSEU NEGATIVE 02/06/2018 1227   HGBUR NEGATIVE 02/06/2018 1227   BILIRUBINUR NEGATIVE 02/06/2018 1227   KETONESUR NEGATIVE 02/06/2018 1227   PROTEINUR NEGATIVE 02/06/2018 1227   NITRITE NEGATIVE 02/06/2018 1227   LEUKOCYTESUR NEGATIVE 02/06/2018 1227   Sepsis Labs Invalid input(s): PROCALCITONIN,  WBC,  LACTICIDVEN Microbiology Recent Results (from the past 240 hour(s))  Culture, blood (routine x 2)     Status: None (Preliminary result)   Collection Time: 02/08/18  4:35 PM  Result Value Ref Range Status   Specimen Description BLOOD BLOOD LEFT HAND  Final   Special Requests   Final    BOTTLES DRAWN AEROBIC AND ANAEROBIC Blood Culture results may not be optimal due to an inadequate volume of blood received in culture bottles   Culture   Final    NO GROWTH 2 DAYS Performed at [facility], [address], [place], [place] [address]    Report Status PENDING  Incomplete  Culture, blood (routine x 2)     Status: None (Preliminary result)   Collection Time: 02/08/18  4:55 PM  Result Value Ref Range Status   Specimen Description BLOOD BLOOD RIGHT HAND  Final   Special Requests   Final    BOTTLES DRAWN AEROBIC AND ANAEROBIC Blood Culture adequate volume   Culture   Final    NO GROWTH 2 DAYS Performed at [facility], [address] [place], [place] [address]    Report Status PENDING  Incomplete  Aerobic/Anaerobic Culture (surgical/deep wound)     Status: None (Preliminary result)   Collection Time: 02/09/18 12:05 PM  Result Value Ref Range Status   Specimen Description BILE  Final   Special Requests Normal  Final   Gram Stain   Final    RARE WBC PRESENT, PREDOMINANTLY PMN RARE GRAM POSITIVE COCCI    Culture   Final    RARE STAPHYLOCOCCUS AUREUS SUSCEPTIBILITIES TO FOLLOW Performed at [facility], [address], [place], [place]  [address]    Report Status PENDING  Incomplete     Time coordinating discharge: 45 minutes  SIGNED:   Coralie Keens, MD  [facility] 02/12/2018, 1:00 PM Pager [phone]  If 7PM-7AM, please contact night-coverage [email or web address] Password [email or web address]

## 2018-02-13 LAB — CULTURE, BLOOD (ROUTINE X 2)
Culture: NO GROWTH
Culture: NO GROWTH
Special Requests: ADEQUATE

## 2018-02-13 NOTE — NC FL2 (Signed)
[place] MEDICAID FL2 LEVEL OF CARE SCREENING TOOL     IDENTIFICATION  Patient Name: Dawn Gonzales Birthdate: January 13, 1941 Sex: female Admission Date (Current Location): 02/06/2018  [facility] and [place] Number:  Producer, television/film/video and Address:  [facility], [address] [place], [place] [address]      Provider Number: [id number]  Attending Physician Name and Address:  Elease Etienne, MD  Relative Name and Phone Number:  Carilyn Goodpasture; daughter; [phone]    Current Level of Care: Hospital Recommended Level of Care: Skilled Nursing Facility Prior Approval Number:    Date Approved/Denied:   PASRR Number: [id number]  Discharge Plan: SNF    Current Diagnoses: Patient Active Problem List   Diagnosis Date Noted  . Calculus of bile duct with acute cholecystitis and obstruction   . Obesity, Class III, BMI 40-49.9 (morbid obesity) (HCC)   . Junctional bradycardia   . Cholecystitis, acute with cholelithiasis 02/07/2018  . Calculus of gallbladder with biliary obstruction but without cholecystitis   . Preoperative cardiovascular examination   . Acute cholecystitis   . Severe aortic stenosis   . Choledocholithiasis   . N&V (nausea and vomiting) 02/06/2018  . Epigastric pain 02/06/2018    Orientation RESPIRATION BLADDER Height & Weight     Self, Time, Place, Situation  O2(nasal canula) Incontinent, External catheter Weight: 278 lb 10.6 oz (126.4 kg) Height:  5\' 6"  (167.6 cm)  BEHAVIORAL SYMPTOMS/MOOD NEUROLOGICAL BOWEL NUTRITION STATUS      Continent Diet  AMBULATORY STATUS COMMUNICATION OF NEEDS Skin   Extensive Assist Verbally Other (Comment)(discoloration on BLE; drain on RUQ)                       Personal Care Assistance Level of Assistance  Bathing, Feeding, Dressing Bathing Assistance: Maximum assistance Feeding assistance: Independent Dressing Assistance: Limited assistance     Functional Limitations Info  Sight,  Hearing, Speech Sight Info: Adequate Hearing Info: Adequate Speech Info: Adequate    SPECIAL CARE FACTORS FREQUENCY  PT (By licensed PT), OT (By licensed OT)     PT Frequency: 5x week OT Frequency: 5x week            Contractures Contractures Info: Not present    Additional Factors Info  Code Status, Allergies, Psychotropic Code Status Info: Full Code Allergies Info: No Known Allergies Psychotropic Info: venlafaxine (EFFEXOR) tablet 37.5 mg daily PO         Current Medications (02/13/2018):  This is the current hospital active medication list Current Facility-Administered Medications  Medication Dose Route Frequency Provider Last Rate Last Dose  . acetaminophen (TYLENOL) tablet 650 mg  650 mg Oral Q6H PRN Pearson Grippe, MD   650 mg at 02/12/18 1614   Or  . acetaminophen (TYLENOL) suppository 650 mg  650 mg Rectal Q6H PRN Pearson Grippe, MD      . amoxicillin-clavulanate (AUGMENTIN) 875-125 MG per tablet 1 tablet  1 tablet Oral Q12H Arrien, York Ram, MD   1 tablet at 02/13/18 1028  . aspirin EC tablet 81 mg  81 mg Oral Daily Pearson Grippe, MD   81 mg at 02/13/18 1028  . calcium carbonate (OS-CAL - dosed in mg of elemental calcium) tablet 1,250 mg  1,250 mg Oral Daily Pearson Grippe, MD   1,250 mg at 02/13/18 1029  . cholecalciferol (VITAMIN D) tablet 1,000 Units  1,000 Units Oral Daily Pearson Grippe, MD   1,000 Units at 02/13/18 1029  . gabapentin (  NEURONTIN) capsule 300 mg  300 mg Oral TID Pearson Grippe, MD   300 mg at 02/13/18 1031  . heparin injection 5,000 Units  5,000 Units Subcutaneous Q8H Hoyt Koch, PA   5,000 Units at 02/13/18 0530  . indomethacin (INDOCIN) 50 MG suppository 100 mg  100 mg Rectal Once Dianah Field, PA-C      . MEDLINE mouth rinse  15 mL Mouth Rinse BID Pearson Grippe, MD   15 mL at 02/13/18 1032  . multivitamin with minerals tablet 1 tablet  1 tablet Oral Daily Pearson Grippe, MD   1 tablet at 02/13/18 1028  . ondansetron (ZOFRAN) injection 4 mg  4 mg  Intravenous Q6H PRN Pearson Grippe, MD      . pantoprazole (PROTONIX) EC tablet 40 mg  40 mg Oral Daily Dianah Field, PA-C   40 mg at 02/13/18 1029  . phenol (CHLORASEPTIC) mouth spray 1 spray  1 spray Mouth/Throat PRN Arrien, York Ram, MD   1 spray at 02/09/18 1320  . polyethylene glycol (MIRALAX / GLYCOLAX) packet 17 g  17 g Oral Daily Dianah Field, PA-C   17 g at 02/13/18 1028  . pravastatin (PRAVACHOL) tablet 20 mg  20 mg Oral q1800 Pearson Grippe, MD   20 mg at 02/12/18 1804  . sodium chloride flush (NS) 0.9 % injection 5 mL  5 mL Intracatheter Q8H Richarda Overlie, MD   5 mL at 02/13/18 0531  . venlafaxine (EFFEXOR) tablet 37.5 mg  37.5 mg Oral Daily Pearson Grippe, MD   37.5 mg at 02/13/18 1029  . vitamin C (ASCORBIC ACID) tablet 250 mg  250 mg Oral Daily Erick Blinks, MD   250 mg at 02/13/18 1029     Discharge Medications: Please see discharge summary for a list of discharge medications.  Relevant Imaging Results:  Relevant Lab Results:   Additional Information SS#[id number] [place], [place]

## 2018-02-13 NOTE — Social Work (Addendum)
CSW again unable to reach pt daughter to discuss offers. Pt has received insurance approval but to complete authorization CSW needs facility choice.   10:10am- Spoke with pt daughter, pt daughter reviewed offers and has selected [facility] for SNF placement. [employer] called and updated with facility choice. [facility] liaison Olegario Messier will come by and meet pt. MD contacted.   Octavio Graves, MSW, [facility] Clinical Social Work [phone]

## 2018-02-13 NOTE — Evaluation (Signed)
Occupational Therapy Evaluation Patient Details Name: Dawn Gonzales MRN: [id number] DOB: 1940/12/16 Today's Date: 02/13/2018    History of Present Illness 77 year old female who presented with abdominal pain.  She does have significant past medical history for severe aortic stenosis, depression, obesity and dyslipidemia. Dx acute cholecystitis complicated with choledocholithiasis and sepsis (not present on admission) gram positive cocci. sp Endoscopic [place] and ERCP with stone extraction and biliary sphincterectomy.    Clinical Impression   Patient is s/p Cholecysitis with sepsis surgery resulting in functional limitations due to the deficits listed below (see OT problem list). Pt currently requires total +2 min (A) for sit<>stand with frequent rest breaks. Pt noted to fatigue quickly and chair follow required for even ~41ft.  Patient will benefit from skilled OT acutely to increase independence and safety with ADLS to allow discharge SNF. Pt motivated this session by getting out of the room at the end in a recliner chair with garth brooks music "the river" as a conclusion to the session.     Follow Up Recommendations  SNF    Equipment Recommendations  3 in 1 bedside commode;Hospital bed;Wheelchair cushion (measurements OT);Wheelchair (measurements OT)    Recommendations for Other Services       Precautions / Restrictions Precautions Precautions: Fall Precaution Comments: hx Restrictions Weight Bearing Restrictions: No      Mobility Bed Mobility               General bed mobility comments: OOB in recliner on entry  Transfers Overall transfer level: Needs assistance Equipment used: Rolling walker (2 wheeled) Transfers: Sit to/from Stand Sit to Stand: Min assist;+2 physical assistance         General transfer comment: minAx2 for powerup with sit>stand, pt with a total of 3x sit<>stand during  vc for scooting hips to edge of chair for hand     Balance Overall  balance assessment: Needs assistance Sitting-balance support: Feet supported;No upper extremity supported Sitting balance-Leahy Scale: Fair     Standing balance support: Bilateral upper extremity supported Standing balance-Leahy Scale: Poor Standing balance comment: requires RW and cues from therapist for safety                           ADL either performed or assessed with clinical judgement   ADL Overall ADL's : Needs assistance/impaired Eating/Feeding: Modified independent   Grooming: Wash/dry hands;Wash/dry face;Set up;Sitting Grooming Details (indicate cue type and reason): unable to sustain static standing for grooming task Upper Body Bathing: Minimal assistance   Lower Body Bathing: Maximal assistance;Sit to/from stand   Upper Body Dressing : Minimal assistance   Lower Body Dressing: Maximal assistance;Sit to/from stand               Functional mobility during ADLs: +2 for physical assistance;Minimal assistance General ADL Comments: Pt requires restbreaks frequently and cues for controlled purse lip breathing. pt sit<>stand x3 this session     Vision Baseline Vision/History: Wears glasses Wears Glasses: At all times       Perception     Praxis      Pertinent Vitals/Pain Pain Assessment: Faces Faces Pain Scale: Hurts a little bit Pain Location: R LQ , R knee  Pain Descriptors / Indicators: Aching;Sore Pain Intervention(s): Limited activity within patient's tolerance     Hand Dominance Right   Extremity/Trunk Assessment Upper Extremity Assessment Upper Extremity Assessment: Generalized weakness   Lower Extremity Assessment Lower Extremity Assessment: Generalized weakness   Cervical / Trunk  Assessment Cervical / Trunk Assessment: Kyphotic   Communication Communication Communication: No difficulties   Cognition Arousal/Alertness: Awake/alert Behavior During Therapy: WFL for tasks assessed/performed Overall Cognitive Status: Within  Functional Limits for tasks assessed                                     General Comments  VSS with DOE 3 out4. pt progressing when able to control breathing with counting    Exercises     Shoulder Instructions      Home Living Family/patient expects to be discharged to:: Private residence Living Arrangements: Spouse/significant other Available Help at Discharge: Available 24 hours/day;Family Type of Home: House Home Access: Stairs to enter;Ramped entrance Entrance Stairs-Number of Steps: 5   Home Layout: One level     Bathroom Shower/Tub: Other (comment)   Bathroom Toilet: Standard Bathroom Accessibility: Yes   Home Equipment: Walker - 2 wheels;Wheelchair - manual          Prior Functioning/Environment Level of Independence: Needs assistance  Gait / Transfers Assistance Needed: uses RW to transfer to [facility], able to mobilize in house without assist ADL's / Homemaking Assistance Needed: independent with bathing and dressing, cooking and light housework            OT Problem List: Decreased strength;Decreased range of motion;Decreased activity tolerance;Impaired balance (sitting and/or standing);Decreased coordination;Decreased safety awareness;Decreased knowledge of use of DME or AE;Decreased knowledge of precautions;Cardiopulmonary status limiting activity;Obesity;Pain      OT Treatment/Interventions: Self-care/ADL training;Therapeutic exercise;Neuromuscular education;Energy conservation;DME and/or AE instruction;Manual therapy;Modalities;Therapeutic activities;Patient/family education;Balance training    OT Goals(Current goals can be found in the care plan section) Acute Rehab OT Goals Patient Stated Goal: to get out of the room OT Goal Formulation: With patient Time For Goal Achievement: 02/27/18 Potential to Achieve Goals: Good  OT Frequency: Min 2X/week   Barriers to D/C:            Co-evaluation              AM-PAC PT "6 Clicks"  Daily Activity     Outcome Measure Help from another person eating meals?: A Little Help from another person taking care of personal grooming?: A Lot Help from another person toileting, which includes using toliet, bedpan, or urinal?: A Lot Help from another person bathing (including washing, rinsing, drying)?: A Lot Help from another person to put on and taking off regular upper body clothing?: A Little Help from another person to put on and taking off regular lower body clothing?: A Lot 6 Click Score: 14   End of Session Equipment Utilized During Treatment: Gait belt;Rolling walker Nurse Communication: Mobility status;Precautions  Activity Tolerance: Patient tolerated treatment well Patient left: in chair;with call bell/phone within reach;with chair alarm set  OT Visit Diagnosis: Unsteadiness on feet (R26.81);Muscle weakness (generalized) (M62.81)                Time: 1610-9604 OT Time Calculation (min): 28 min Charges:  OT General Charges $OT Visit: 1 Visit OT Evaluation $OT Eval Moderate Complexity: 1 Mod   Mateo Flow, OTR/L  Acute Rehabilitation Services Pager: [phone] Office: [phone] .   Boone Master B 02/13/2018, 3:11 PM

## 2018-02-13 NOTE — Social Work (Signed)
Clinical Social Worker facilitated patient discharge including contacting patient family and facility to confirm patient discharge plans.  Clinical information faxed to facility and family agreeable with plan.  CSW arranged ambulance transport via [facility] to [facility] RN to call [phone]  with report prior to discharge.  Clinical Social Worker will sign off for now as social work intervention is no longer needed. Please consult us again if new need arises.  Dewon Mendizabal H Sephora Boyar, LCSWA Clinical Social Worker [phone]  

## 2018-02-13 NOTE — Progress Notes (Signed)
PROGRESS NOTE   Dawn Gonzales  VVO:[id number]    DOB: 1940-09-04    DOA: 02/06/2018  PCP: Benita Stabile, MD   I have briefly reviewed patients previous medical records in [facility].  Brief Narrative:  77 year old female with PMH of depression, HLD, severe aortic stenosis, morbid obesity, presented with severe epigastric abdominal pain associated with nausea.  She was admitted and managed for acute cholecystitis complicated by CBD obstruction.  She was initially admitted to [facility].  Surgery was contemplated.  She was deemed high cardiovascular risk for abdominal surgery and was transferred to [facility] for further evaluation.  Cardiology, GI, general surgery and interventional radiology consulted.  Percutaneous cholecystostomy placed.  Briefly reviewed DC summary done by Dr. Ella Jubilee on 10/15.  Patient being discharged to SNF on 02/13/2018.   Assessment & Plan:   Principal Problem:   Epigastric pain Active Problems:   N&V (nausea and vomiting)   Calculus of gallbladder with biliary obstruction but without cholecystitis   Preoperative cardiovascular examination   Acute cholecystitis   Severe aortic stenosis   Choledocholithiasis   Cholecystitis, acute with cholelithiasis   Junctional bradycardia   Calculus of bile duct with acute cholecystitis and obstruction   Obesity, Class III, BMI 40-49.9 (morbid obesity) (HCC)   Acute cholecystitis complicated by choledocholithiasis and sepsis (sepsis not present on admission)- principal Discharge Diagnosis: Patient was initially admitted to [facility] where she received broad-spectrum IV antibiotics, IV fluids.  Further work-up including MRCP showed cholelithiasis with gallbladder wall thickening and biliary duct dilatation with a subtle 7 mm distal CBD stone.  General surgery was consulted.  She was felt to be high cardiovascular risk for abdominal surgery and hence transferred to [facility] for further evaluation.  GI was consulted  and she underwent endoscopic ultrasound and ERCP with successful stone extraction and biliary sphincterotomy.  IR was consulted and she had percutaneous cholecystostomy tube placed 02/09/2018. Her leukocytosis has resolved.  Blood cultures x2 negative.  Biliary culture results are pending but preliminary results showed rare Staphylococcus aureus.  Patient was transitioned to oral Augmentin to complete total 2 weeks course of effective antibiotic therapy on 02/20/2018.  This is reportedly patient's second episode of cholecystitis requiring a cholecystectomy tube.  Patient is considering surgical intervention and will need preop cardiology evaluation and clearance for same.  Patient is to follow-up with Dr. Dwain Sarna in 6 weeks to discuss possible cholecystectomy pending cardiology recommendations/risk stratification.  Percutaneous drain to remain in place and IR will arrange outpatient follow-up.  Tolerating diet and having BMs. Clinically improved and stable.  Please follow final biliary culture results at SNF.  Severe aortic stenosis: Cardiology was consulted and as per their sign off note 02/11/2018, no current indication for urgent TAVR, cardiology will have patient follow-up with Dr. Nona Dell as outpatient in about 2-4 weeks, prior to any planned abdominal surgery to optimize prior to procedure.  Constipation: Had BM 2 days PTA.  Junctional rhythm: As per cardiology, may be related to undiagnosed OSA.  Outpatient evaluation for sleep apnea.  Suspected OSA: Complicated by junctional bradycardia and sinus pauses.  She was placed on telemetry which showed these.  Improved after placing patient on CPAP.  Continue auto CPAP at SNF and recommend outpatient sleep study to further evaluate.  Dyslipidemia: Continue pravastatin.  Morbid obesity/Body mass index is 44.98 kg/m.  This implies further risk for perioperative complications.  May consider outpatient pulmonology consultation prior to  surgery.  Depression: Continue venlafaxine.  Normocytic  anemia: Suspected due to chronic disease.  Stable.  Periodically follow CBCs and BMP as outpatient.  DVT prophylaxis: Subcutaneous heparin Code Status: Full Family Communication: None at bedside Disposition: DC to SNF 10/16   Consultants:  Cardiology [place] [facility] General surgery Interventional radiology  Procedures:  As above  Antimicrobials:  Above   Subjective: Patient reports mild and appropriate pain at percutaneous cholecystostomy site.  Tolerating diet.  Had BM 2 days ago.  Slept well last night.  No nausea, vomiting.  Denies dyspnea, chest pain, dizziness or lightheadedness.  As per RN, no acute issues noted.  ROS: As above, otherwise negative.  Objective:  Vitals:   02/12/18 2058 02/12/18 2250 02/12/18 2251 02/13/18 0451  BP: 127/70   (!) 147/84  Pulse: 72 69 70 (!) 56  Resp: 18 20 20 20   Temp: 99.1 F (37.3 C)   98 F (36.7 C)  TempSrc: Oral   Axillary  SpO2: 92% (!) 89% 95% 97%  Weight:      Height:        Examination:  General exam: Pleasant elderly female, moderately built and morbidly obese, sitting up comfortably in bed without distress. Respiratory system: Clear to auscultation. Respiratory effort normal. Cardiovascular system: S1 & S2 heard, RRR. No JVD, rubs, gallops or clicks.  Grade 3 x 6 systolic ejection murmur best heard at the apex.  No pedal edema. Gastrointestinal system: Abdomen is nondistended, soft and nontender. No organomegaly or masses felt. Normal bowel sounds heard.  RUQ percutaneous drain without acute findings. Central nervous system: Alert and oriented. No focal neurological deficits. Extremities: Symmetric 5 x 5 power. Skin: No rashes, lesions or ulcers Psychiatry: Judgement and insight appear normal. Mood & affect appropriate.     Data Reviewed: I have personally reviewed following labs and imaging studies  CBC: Recent Labs  Lab 02/06/18 1251 02/07/18 0513  02/09/18 0111 02/10/18 0932 02/11/18 0146 02/12/18 0635  WBC 9.2 7.7 17.7* 11.3* 8.5 6.2  NEUTROABS 6.9  --  15.8*  --  6.4 3.3  HGB 11.7* 10.7* 10.7* 10.1* 10.2* 10.5*  HCT 38.5 34.5* 35.2* 33.8* 32.8* 34.4*  MCV 98.0 99.1 97.8 98.5 97.3 96.6  PLT 255 244 249 221 229 226   Basic Metabolic Panel: Recent Labs  Lab 02/07/18 0513 02/08/18 0222 02/09/18 0111 02/10/18 0932 02/11/18 0146  NA 141 140 137 136 138  K 3.9 3.9 3.8 3.6 3.9  CL 105 102 101 100 101  CO2 30 31 29 29 30   GLUCOSE 101* 103* 135* 103* 106*  BUN 13 8 14 15 14   CREATININE 0.62 0.68 1.13* 0.86 0.80  CALCIUM 8.8* 9.0 8.3* 8.5* 8.7*   Liver Function Tests: Recent Labs  Lab 02/06/18 1251 02/07/18 0513 02/08/18 0222 02/10/18 0932  AST 17 15 18 16   ALT 12 11 11 17   ALKPHOS 69 62 63 79  BILITOT 0.6 0.6 0.5 0.7  PROT 6.8 6.3* 5.9* 6.1*  ALBUMIN 3.6 3.3* 3.0* 2.6*   Coagulation Profile: Recent Labs  Lab 02/07/18 1705  INR 1.07   Cardiac Enzymes: Recent Labs  Lab 02/06/18 1251 02/06/18 1819 02/06/18 2302 02/07/18 0513  TROPONINI <0.03 <0.03 <0.03 <0.03   CBG: Recent Labs  Lab 02/08/18 1022  GLUCAP 92    Recent Results (from the past 240 hour(s))  Culture, blood (routine x 2)     Status: None   Collection Time: 02/08/18  4:35 PM  Result Value Ref Range Status   Specimen Description BLOOD BLOOD LEFT HAND  Final   Special Requests   Final    BOTTLES DRAWN AEROBIC AND ANAEROBIC Blood Culture results may not be optimal due to an inadequate volume of blood received in culture bottles   Culture   Final    NO GROWTH 5 DAYS Performed at [facility], [address], [place], [place] [address]    Report Status 02/13/2018 FINAL  Final  Culture, blood (routine x 2)     Status: None   Collection Time: 02/08/18  4:55 PM  Result Value Ref Range Status   Specimen Description BLOOD BLOOD RIGHT HAND  Final   Special Requests   Final    BOTTLES DRAWN AEROBIC AND ANAEROBIC Blood Culture adequate  volume   Culture   Final    NO GROWTH 5 DAYS Performed at [facility], [address], [place], [place] [address]    Report Status 02/13/2018 FINAL  Final  Aerobic/Anaerobic Culture (surgical/deep wound)     Status: None (Preliminary result)   Collection Time: 02/09/18 12:05 PM  Result Value Ref Range Status   Specimen Description BILE  Final   Special Requests Normal  Final   Gram Stain   Final    RARE WBC PRESENT, PREDOMINANTLY PMN RARE GRAM POSITIVE COCCI    Culture   Final    RARE STAPHYLOCOCCUS AUREUS SUSCEPTIBILITIES TO FOLLOW Performed at [facility], [address], [place], [place] [address]    Report Status PENDING  Incomplete         Radiology Studies: No results found.      Scheduled Meds: . amoxicillin-clavulanate  1 tablet Oral Q12H  . aspirin EC  81 mg Oral Daily  . calcium carbonate  1,250 mg Oral Daily  . cholecalciferol  1,000 Units Oral Daily  . gabapentin  300 mg Oral TID  . heparin injection (subcutaneous)  5,000 Units Subcutaneous Q8H  . indomethacin  100 mg Rectal Once  . mouth rinse  15 mL Mouth Rinse BID  . multivitamin with minerals  1 tablet Oral Daily  . pantoprazole  40 mg Oral Daily  . polyethylene glycol  17 g Oral Daily  . pravastatin  20 mg Oral q1800  . sodium chloride flush  5 mL Intracatheter Q8H  . venlafaxine  37.5 mg Oral Daily  . vitamin C  250 mg Oral Daily   Continuous Infusions:   LOS: 6 days     Marcellus Scott, MD, FACP, [facility]. [facility] Pager [phone]  If 7PM-7AM, please contact night-coverage [email or web address] Password [email or web address] 02/13/2018, 12:27 PM

## 2018-02-13 NOTE — Progress Notes (Signed)
Referring Physician(s): Dr. Daphine Deutscher  Supervising Physician: Dr. Archer Asa  Patient Status:  [facility] - In-pt  Chief Complaint: Cholecystitis, choledocholithiasis  Subjective: Sitting up in chair.  Ready for discharge  Allergies: Patient has no known allergies.  Medications:  Current Facility-Administered Medications:  .  acetaminophen (TYLENOL) tablet 650 mg, 650 mg, Oral, Q6H PRN, 650 mg at 02/12/18 1614 **OR** acetaminophen (TYLENOL) suppository 650 mg, 650 mg, Rectal, Q6H PRN, Pearson Grippe, MD .  amoxicillin-clavulanate (AUGMENTIN) 875-125 MG per tablet 1 tablet, 1 tablet, Oral, Q12H, Arrien, York Ram, MD, 1 tablet at 02/13/18 1028 .  aspirin EC tablet 81 mg, 81 mg, Oral, Daily, Pearson Grippe, MD, 81 mg at 02/13/18 1028 .  calcium carbonate (OS-CAL - dosed in mg of elemental calcium) tablet 1,250 mg, 1,250 mg, Oral, Daily, Pearson Grippe, MD, 1,250 mg at 02/13/18 1029 .  cholecalciferol (VITAMIN D) tablet 1,000 Units, 1,000 Units, Oral, Daily, Pearson Grippe, MD, 1,000 Units at 02/13/18 1029 .  gabapentin (NEURONTIN) capsule 300 mg, 300 mg, Oral, TID, Pearson Grippe, MD, 300 mg at 02/13/18 1031 .  heparin injection 5,000 Units, 5,000 Units, Subcutaneous, Q8H, Hoyt Koch, PA, 5,000 Units at 02/13/18 0530 .  indomethacin (INDOCIN) 50 MG suppository 100 mg, 100 mg, Rectal, Once, Dianah Field, PA-C .  MEDLINE mouth rinse, 15 mL, Mouth Rinse, BID, Pearson Grippe, MD, 15 mL at 02/13/18 1032 .  multivitamin with minerals tablet 1 tablet, 1 tablet, Oral, Daily, Pearson Grippe, MD, 1 tablet at 02/13/18 1028 .  ondansetron (ZOFRAN) injection 4 mg, 4 mg, Intravenous, Q6H PRN, Pearson Grippe, MD .  pantoprazole (PROTONIX) EC tablet 40 mg, 40 mg, Oral, Daily, Dianah Field, PA-C, 40 mg at 02/13/18 1029 .  phenol (CHLORASEPTIC) mouth spray 1 spray, 1 spray, Mouth/Throat, PRN, Arrien, York Ram, MD, 1 spray at 02/09/18 1320 .  polyethylene glycol (MIRALAX / GLYCOLAX) packet 17 g, 17 g,  Oral, Daily, Dianah Field, PA-C, 17 g at 02/13/18 1028 .  pravastatin (PRAVACHOL) tablet 20 mg, 20 mg, Oral, q1800, Pearson Grippe, MD, 20 mg at 02/12/18 1804 .  sodium chloride flush (NS) 0.9 % injection 5 mL, 5 mL, Intracatheter, Q8H, Henn, Adam, MD, 5 mL at 02/13/18 0531 .  venlafaxine (EFFEXOR) tablet 37.5 mg, 37.5 mg, Oral, Daily, Pearson Grippe, MD, 37.5 mg at 02/13/18 1029 .  vitamin C (ASCORBIC ACID) tablet 250 mg, 250 mg, Oral, Daily, Memon, Durward Mallard, MD, 250 mg at 02/13/18 1029    Vital Signs: BP (!) 147/84 (BP Location: Left Arm)   Pulse (!) 56   Temp 98 F (36.7 C) (Axillary)   Resp 20   Ht 5\' 6"  (1.676 m)   Wt 278 lb 10.6 oz (126.4 kg)   SpO2 97%   BMI 44.98 kg/m   Physical Exam  NAD, alert Abdomen: soft, non-distended, non-tender.  RUQ drain in place.  Insertion site c/d/i, very small amount of serosanguinous fluid present.   Imaging: Ir Perc Cholecystostomy  Result Date: 02/09/2018 INDICATION: 77 year old with choledocholithiasis, cholelithiasis and concern for cholecystitis. Prior cholecystostomy tube. EXAM: IMAGE GUIDED PERCUTANEOUS CHOLECYSTOSTOMY TUBE PLACEMENT MEDICATIONS: Inpatient on antibiotics ANESTHESIA/SEDATION: Moderate (conscious) sedation was employed during this procedure. A total of Versed 1 mg and Fentanyl 50 mcg was administered intravenously. Moderate Sedation Time: 22 minutes. The patient's level of consciousness and vital signs were monitored continuously by radiology nursing throughout the procedure under my direct supervision. FLUOROSCOPY TIME:  None COMPLICATIONS: None immediate. PROCEDURE: Informed written consent was obtained from the  patient after a thorough discussion of the procedural risks, benefits and alternatives. All questions were addressed. A timeout was performed prior to the initiation of the procedure. Patient was placed supine on the CT scanner. Images through the upper abdomen were obtained. Abdomen was also evaluated with ultrasound.  Abnormal gallbladder was identified in the anterior right upper abdomen. The right upper abdomen was prepped and draped in sterile fashion. Maximal barrier sterile technique was utilized including caps, mask, sterile gowns, sterile gloves, sterile drape, hand hygiene and skin antiseptic. Skin was anesthetized with 1% lidocaine. 18 gauge trocar needle was directed into the abnormal gallbladder using ultrasound and CT guidance. Needle placement was difficult due to large gallstone. Eventually, wire was confirmed within the gallbladder. The tract was dilated to accommodate a 10.2 [place] multipurpose drain. Approximately 5 mL of brown purulent looking fluid was aspirated. CT images confirmed drain placement in the gallbladder. The catheter was sutured to skin and attached to a gravity bag. FINDINGS: Stone filled gallbladder with surrounding inflammatory changes. Drain was successfully placed within this abnormal gallbladder. 5 mL of dark purulent looking fluid was removed and sent for culture. IMPRESSION: Successful image guided percutaneous cholecystostomy tube placement. Gallbladder is nondistended and stone filled. Small amount of fluid was removed from the gallbladder and sent for culture. Electronically Signed   By: Richarda Overlie M.D.   On: 02/09/2018 13:36   Ct Abd Limited W/o Cm  Result Date: 02/09/2018 INDICATION: 77 year old with choledocholithiasis, cholelithiasis and concern for cholecystitis. Prior cholecystostomy tube. EXAM: IMAGE GUIDED PERCUTANEOUS CHOLECYSTOSTOMY TUBE PLACEMENT MEDICATIONS: Inpatient on antibiotics ANESTHESIA/SEDATION: Moderate (conscious) sedation was employed during this procedure. A total of Versed 1 mg and Fentanyl 50 mcg was administered intravenously. Moderate Sedation Time: 22 minutes. The patient's level of consciousness and vital signs were monitored continuously by radiology nursing throughout the procedure under my direct supervision. FLUOROSCOPY TIME:  None COMPLICATIONS:  None immediate. PROCEDURE: Informed written consent was obtained from the patient after a thorough discussion of the procedural risks, benefits and alternatives. All questions were addressed. A timeout was performed prior to the initiation of the procedure. Patient was placed supine on the CT scanner. Images through the upper abdomen were obtained. Abdomen was also evaluated with ultrasound. Abnormal gallbladder was identified in the anterior right upper abdomen. The right upper abdomen was prepped and draped in sterile fashion. Maximal barrier sterile technique was utilized including caps, mask, sterile gowns, sterile gloves, sterile drape, hand hygiene and skin antiseptic. Skin was anesthetized with 1% lidocaine. 18 gauge trocar needle was directed into the abnormal gallbladder using ultrasound and CT guidance. Needle placement was difficult due to large gallstone. Eventually, wire was confirmed within the gallbladder. The tract was dilated to accommodate a 10.2 [place] multipurpose drain. Approximately 5 mL of brown purulent looking fluid was aspirated. CT images confirmed drain placement in the gallbladder. The catheter was sutured to skin and attached to a gravity bag. FINDINGS: Stone filled gallbladder with surrounding inflammatory changes. Drain was successfully placed within this abnormal gallbladder. 5 mL of dark purulent looking fluid was removed and sent for culture. IMPRESSION: Successful image guided percutaneous cholecystostomy tube placement. Gallbladder is nondistended and stone filled. Small amount of fluid was removed from the gallbladder and sent for culture. Electronically Signed   By: Richarda Overlie M.D.   On: 02/09/2018 13:36    Labs:  CBC: Recent Labs    02/09/18 0111 02/10/18 0932 02/11/18 0146 02/12/18 0635  WBC 17.7* 11.3* 8.5 6.2  HGB 10.7* 10.1*  10.2* 10.5*  HCT 35.2* 33.8* 32.8* 34.4*  PLT 249 221 229 226    COAGS: Recent Labs    02/07/18 1705  INR 1.07     BMP: Recent Labs    02/08/18 0222 02/09/18 0111 02/10/18 0932 02/11/18 0146  NA 140 137 136 138  K 3.9 3.8 3.6 3.9  CL 102 101 100 101  CO2 31 29 29 30   GLUCOSE 103* 135* 103* 106*  BUN 8 14 15 14   CALCIUM 9.0 8.3* 8.5* 8.7*  CREATININE 0.68 1.13* 0.86 0.80  GFRNONAA >60 46* >60 >60  GFRAA >60 53* >60 >60    LIVER FUNCTION TESTS: Recent Labs    02/06/18 1251 02/07/18 0513 02/08/18 0222 02/10/18 0932  BILITOT 0.6 0.6 0.5 0.7  AST 17 15 18 16   ALT 12 11 11 17   ALKPHOS 69 62 63 79  PROT 6.8 6.3* 5.9* 6.1*  ALBUMIN 3.6 3.3* 3.0* 2.6*    Assessment and Plan: Acute cholecystitis s/p drain placement 02/09/18 by Dr. Lowella Dandy Patient s/p cholecystostomy tube placement.  Remains in place. She is considering surgical intervention; at increased risk per Dr. Daphine Deutscher note.  Drain to remain in place for now. Was planning to discharge home, however now tells me she has decided to go to SNF.  Provided with drain card and script for flushes in the even they are needed. She will hear from schedulers with date and time of follow-up appointment if drain remains.   Electronically Signed: Hoyt Koch, PA 02/13/2018, 12:00 PM   I spent a total of 15 Minutes at the the patient's bedside AND on the patient's hospital floor or unit, greater than 50% of which was counseling/coordinating care for cholecystits.

## 2018-02-13 NOTE — Plan of Care (Signed)
  Problem: Education: Goal: Knowledge of General Education information will improve Description: Including pain rating scale, medication(s)/side effects and non-pharmacologic comfort measures Outcome: Progressing   Problem: Activity: Goal: Risk for activity intolerance will decrease Outcome: Progressing   Problem: Nutrition: Goal: Adequate nutrition will be maintained Outcome: Progressing   

## 2018-02-13 NOTE — Progress Notes (Signed)
Occupational Therapy Treatment Patient Details Name: Dawn Gonzales MRN: [id number] DOB: 1940/12/05 Today's Date: 02/13/2018    History of present illness 77 year old female who presented with abdominal pain.  She does have significant past medical history for severe aortic stenosis, depression, obesity and dyslipidemia. Dx acute cholecystitis complicated with choledocholithiasis and sepsis (not present on admission) gram positive cocci. sp Endoscopic [place] and ERCP with stone extraction and biliary sphincterectomy.    OT comments  Ot returning to the room to help (A) CNA with [facility] transfer. Pt benefits from scooting to the edge of recliner and bil UE on chair to power up into standing. Pt requires cues for purse lip breathing to advance sit<>STand. Pt total (A) for all peri care due to unable to release RW.    Follow Up Recommendations  SNF    Equipment Recommendations  3 in 1 bedside commode;Hospital bed;Wheelchair cushion (measurements OT);Wheelchair (measurements OT)    Recommendations for Other Services      Precautions / Restrictions Precautions Precautions: Fall Precaution Comments: hx Restrictions Weight Bearing Restrictions: No       Mobility Bed Mobility               General bed mobility comments: OOB in recliner on entry  Transfers Overall transfer level: Needs assistance Equipment used: Rolling walker (2 wheeled) Transfers: Sit to/from Stand Sit to Stand: Min assist;+2 physical assistance         General transfer comment: minAx2 for powerup with sit>stand, pt with a total of 3x sit<>stand during  vc for scooting hips to edge of chair for hand     Balance Overall balance assessment: Needs assistance Sitting-balance support: Feet supported;No upper extremity supported Sitting balance-Leahy Scale: Fair     Standing balance support: Bilateral upper extremity supported Standing balance-Leahy Scale: Poor Standing balance comment: requires RW and cues  from therapist for safety                           ADL either performed or assessed with clinical judgement   ADL Overall ADL's : Needs assistance/impaired Eating/Feeding: Modified independent   Grooming: Wash/dry hands;Wash/dry face;Set up;Sitting Grooming Details (indicate cue type and reason): unable to sustain static standing for grooming task Upper Body Bathing: Minimal assistance   Lower Body Bathing: Maximal assistance;Sit to/from stand   Upper Body Dressing : Minimal assistance   Lower Body Dressing: Maximal assistance;Sit to/from stand   Toilet Transfer: Moderate assistance;+2 for physical assistance;BSC;RW;Stand-pivot   Toileting- Clothing Manipulation and Hygiene: Total assistance;+2 for physical assistance;Sit to/from stand Toileting - Clothing Manipulation Details (indicate cue type and reason): pt attempting to complete anterior peri care and unable to do so in sitting on BSC     Functional mobility during ADLs: +2 for physical assistance;Minimal assistance General ADL Comments: Pt requires restbreaks frequently and cues for controlled purse lip breathing. pt sit<>stand x3 this session     Vision Baseline Vision/History: Wears glasses Wears Glasses: At all times     Perception     Praxis      Cognition Arousal/Alertness: Awake/alert Behavior During Therapy: [facility] for tasks assessed/performed Overall Cognitive Status: Within Functional Limits for tasks assessed                                          Exercises     Shoulder Instructions  General Comments VSS with DOE 3 out4. pt progressing when able to control breathing with counting    Pertinent Vitals/ Pain       Pain Assessment: Faces Faces Pain Scale: Hurts a little bit Pain Location: R LQ , R knee  Pain Descriptors / Indicators: Aching;Sore Pain Intervention(s): Limited activity within patient's tolerance  Home Living Family/patient expects to be discharged  to:: Private residence Living Arrangements: Spouse/significant other Available Help at Discharge: Available 24 hours/day;Family Type of Home: House Home Access: Stairs to enter;Ramped entrance Entrance Stairs-Number of Steps: 5   Home Layout: One level     Bathroom Shower/Tub: Other (comment)   Bathroom Toilet: Standard Bathroom Accessibility: Yes   Home Equipment: Walker - 2 wheels;Wheelchair - manual          Prior Functioning/Environment Level of Independence: Needs assistance  Gait / Transfers Assistance Needed: uses RW to transfer to [facility], able to mobilize in house without assist ADL's / Homemaking Assistance Needed: independent with bathing and dressing, cooking and light housework       Frequency  Min 2X/week        Progress Toward Goals  OT Goals(current goals can now be found in the care plan section)  Progress towards OT goals: Progressing toward goals  Acute Rehab OT Goals Patient Stated Goal: to get out of the room OT Goal Formulation: With patient Time For Goal Achievement: 02/27/18 Potential to Achieve Goals: Good  Plan Discharge plan remains appropriate    Co-evaluation                 AM-PAC PT "6 Clicks" Daily Activity     Outcome Measure   Help from another person eating meals?: A Little Help from another person taking care of personal grooming?: A Lot Help from another person toileting, which includes using toliet, bedpan, or urinal?: A Lot Help from another person bathing (including washing, rinsing, drying)?: A Lot Help from another person to put on and taking off regular upper body clothing?: A Little Help from another person to put on and taking off regular lower body clothing?: A Lot 6 Click Score: 14    End of Session Equipment Utilized During Treatment: Rolling walker;Gait belt  OT Visit Diagnosis: Unsteadiness on feet (R26.81);Muscle weakness (generalized) (M62.81)   Activity Tolerance Patient tolerated treatment well    Patient Left in chair;with call bell/phone within reach   Nurse Communication Mobility status;Precautions        Time: 1105(1105)-1118 OT Time Calculation (min): 13 min  Charges: OT General Charges $OT Visit: 1 Visit OT Evaluation $OT Eval Moderate Complexity: 1 Mod OT Treatments $Self Care/Home Management : 8-22 mins   Mateo Flow, OTR/L  Acute Rehabilitation Services Pager: [phone] Office: [phone] .    Boone Master B 02/13/2018, 3:16 PM

## 2018-02-13 NOTE — Progress Notes (Signed)
Physical Therapy Treatment Patient Details Name: Dawn Gonzales MRN: [id number] DOB: 1940/09/09 Today's Date: 02/13/2018    History of Present Illness 77 year old female who presented with abdominal pain.  She does have significant past medical history for severe aortic stenosis, depression, obesity and dyslipidemia. Dx acute cholecystitis complicated with choledocholithiasis and sepsis (not present on admission) gram positive cocci. sp Endoscopic [place] and ERCP with stone extraction and biliary sphincterectomy.     PT Comments    Pt is making good progress towards her goals by increasing ambulation distance with decreased DoE. Pt continues to be limited in safe mobility by decreased strength and endurance. Pt currently requires minAx2 for transfers and modA with close chair follow for ambulation with RW. D/c plans remain appropriate to improve mobility with and without w/c for ultimate d/c home.     Follow Up Recommendations  SNF     Equipment Recommendations  None recommended by PT    Recommendations for Other Services OT consult     Precautions / Restrictions Precautions Precautions: Fall Precaution Comments: hx Restrictions Weight Bearing Restrictions: No    Mobility  Bed Mobility               General bed mobility comments: OOB in recliner on entry  Transfers Overall transfer level: Needs assistance Equipment used: Rolling walker (2 wheeled) Transfers: Sit to/from Stand Sit to Stand: Min assist;+2 physical assistance         General transfer comment: minAx2 for powerup with sit>stand, pt with a total of 3x sit<>stand during  vc for scooting hips to edge of chair for hand   Ambulation/Gait Ambulation/Gait assistance: Mod assist Gait Distance (Feet): 8 Feet(1x 3 feet, 1x 5 feet, 1x 8 feet) Assistive device: Rolling walker (2 wheeled) Gait Pattern/deviations: Step-to pattern;Shuffle;Trunk flexed Gait velocity: slowed Gait velocity interpretation: <1.31  ft/sec, indicative of household ambulator General Gait Details: modA for steadying with RW, with encouragement and vc for decreased breathing rate pt was able to increase distance of ambulation with each bout          Balance Overall balance assessment: Needs assistance Sitting-balance support: Feet supported;No upper extremity supported Sitting balance-Leahy Scale: Fair     Standing balance support: Bilateral upper extremity supported Standing balance-Leahy Scale: Poor Standing balance comment: requires RW and threapist support                            Cognition Arousal/Alertness: Awake/alert Behavior During Therapy: WFL for tasks assessed/performed Overall Cognitive Status: Within Functional Limits for tasks assessed                                           General Comments General comments (skin integrity, edema, etc.): VSS, 3/4 DoE with movement, able to decrease SoB with guided breathing       Pertinent Vitals/Pain Pain Assessment: Faces Faces Pain Scale: Hurts a little bit Pain Location: R LQ , R knee  Pain Descriptors / Indicators: Aching;Sore Pain Intervention(s): Limited activity within patient's tolerance;Monitored during session;Repositioned           PT Goals (current goals can now be found in the care plan section) Acute Rehab PT Goals Patient Stated Goal: be able to have mobility she had prior to hospitalization PT Goal Formulation: With patient Time For Goal Achievement: 02/25/18 Potential to Achieve Goals: Fair Progress towards  PT goals: Progressing toward goals    Frequency    Min 2X/week      PT Plan Current plan remains appropriate       AM-PAC PT "6 Clicks" Daily Activity  Outcome Measure  Difficulty turning over in bed (including adjusting bedclothes, sheets and blankets)?: Unable Difficulty moving from lying on back to sitting on the side of the bed? : Unable Difficulty sitting down on and standing up  from a chair with arms (e.g., wheelchair, bedside commode, etc,.)?: Unable Help needed moving to and from a bed to chair (including a wheelchair)?: A Lot Help needed walking in hospital room?: Total Help needed climbing 3-5 steps with a railing? : Total 6 Click Score: 7    End of Session Equipment Utilized During Treatment: Gait belt Activity Tolerance: Patient limited by fatigue;Patient tolerated treatment well Patient left: in chair;with call bell/phone within reach;with chair alarm set Nurse Communication: Mobility status PT Visit Diagnosis: Unsteadiness on feet (R26.81);Other abnormalities of gait and mobility (R26.89);Muscle weakness (generalized) (M62.81);History of falling (Z91.81);Difficulty in walking, not elsewhere classified (R26.2);Pain Pain - Right/Left: Right Pain - part of body: Knee(R LQ)     Time: 1610-9604 PT Time Calculation (min) (ACUTE ONLY): 27 min  Charges:  $Gait Training: 8-22 mins                     Dawn Gonzales PT, DPT Acute Rehabilitation Services Pager [phone] Office [phone]    Elon Alas Fleet 02/13/2018, 1:23 PM

## 2018-02-14 ENCOUNTER — Other Ambulatory Visit (HOSPITAL_COMMUNITY): Payer: Self-pay | Admitting: Student

## 2018-02-14 DIAGNOSIS — K819 Cholecystitis, unspecified: Secondary | ICD-10-CM

## 2018-02-17 LAB — MIC RESULT

## 2018-02-17 LAB — MINIMUM INHIBITORY CONC. (1 DRUG)

## 2018-02-22 LAB — AEROBIC/ANAEROBIC CULTURE W GRAM STAIN (SURGICAL/DEEP WOUND): Special Requests: NORMAL

## 2018-02-22 LAB — AEROBIC/ANAEROBIC CULTURE (SURGICAL/DEEP WOUND)

## 2018-03-02 DIAGNOSIS — L89152 Pressure ulcer of sacral region, stage 2: Secondary | ICD-10-CM | POA: Diagnosis not present

## 2018-03-02 DIAGNOSIS — K8043 Calculus of bile duct with acute cholecystitis with obstruction: Secondary | ICD-10-CM | POA: Diagnosis not present

## 2018-03-02 DIAGNOSIS — Z6841 Body Mass Index (BMI) 40.0 and over, adult: Secondary | ICD-10-CM | POA: Diagnosis not present

## 2018-03-02 DIAGNOSIS — M6281 Muscle weakness (generalized): Secondary | ICD-10-CM | POA: Diagnosis not present

## 2018-03-02 DIAGNOSIS — Z4803 Encounter for change or removal of drains: Secondary | ICD-10-CM | POA: Diagnosis not present

## 2018-03-02 DIAGNOSIS — G629 Polyneuropathy, unspecified: Secondary | ICD-10-CM | POA: Diagnosis not present

## 2018-03-02 DIAGNOSIS — I350 Nonrheumatic aortic (valve) stenosis: Secondary | ICD-10-CM | POA: Diagnosis not present

## 2018-03-02 DIAGNOSIS — F329 Major depressive disorder, single episode, unspecified: Secondary | ICD-10-CM | POA: Diagnosis not present

## 2018-03-04 DIAGNOSIS — Z4803 Encounter for change or removal of drains: Secondary | ICD-10-CM | POA: Diagnosis not present

## 2018-03-04 DIAGNOSIS — M6281 Muscle weakness (generalized): Secondary | ICD-10-CM | POA: Diagnosis not present

## 2018-03-04 DIAGNOSIS — L89152 Pressure ulcer of sacral region, stage 2: Secondary | ICD-10-CM | POA: Diagnosis not present

## 2018-03-04 DIAGNOSIS — F329 Major depressive disorder, single episode, unspecified: Secondary | ICD-10-CM | POA: Diagnosis not present

## 2018-03-04 DIAGNOSIS — G629 Polyneuropathy, unspecified: Secondary | ICD-10-CM | POA: Diagnosis not present

## 2018-03-04 DIAGNOSIS — K8043 Calculus of bile duct with acute cholecystitis with obstruction: Secondary | ICD-10-CM | POA: Diagnosis not present

## 2018-03-04 DIAGNOSIS — Z6841 Body Mass Index (BMI) 40.0 and over, adult: Secondary | ICD-10-CM | POA: Diagnosis not present

## 2018-03-04 DIAGNOSIS — I350 Nonrheumatic aortic (valve) stenosis: Secondary | ICD-10-CM | POA: Diagnosis not present

## 2018-03-05 ENCOUNTER — Encounter: Payer: Self-pay | Admitting: Cardiology

## 2018-03-05 DIAGNOSIS — M6281 Muscle weakness (generalized): Secondary | ICD-10-CM | POA: Diagnosis not present

## 2018-03-05 DIAGNOSIS — I350 Nonrheumatic aortic (valve) stenosis: Secondary | ICD-10-CM | POA: Diagnosis not present

## 2018-03-05 DIAGNOSIS — F329 Major depressive disorder, single episode, unspecified: Secondary | ICD-10-CM | POA: Diagnosis not present

## 2018-03-05 DIAGNOSIS — L89152 Pressure ulcer of sacral region, stage 2: Secondary | ICD-10-CM | POA: Diagnosis not present

## 2018-03-05 DIAGNOSIS — Z6841 Body Mass Index (BMI) 40.0 and over, adult: Secondary | ICD-10-CM | POA: Diagnosis not present

## 2018-03-05 DIAGNOSIS — G629 Polyneuropathy, unspecified: Secondary | ICD-10-CM | POA: Diagnosis not present

## 2018-03-05 DIAGNOSIS — Z4803 Encounter for change or removal of drains: Secondary | ICD-10-CM | POA: Diagnosis not present

## 2018-03-05 DIAGNOSIS — K8043 Calculus of bile duct with acute cholecystitis with obstruction: Secondary | ICD-10-CM | POA: Diagnosis not present

## 2018-03-05 NOTE — Progress Notes (Signed)
Cardiology Office Note  Date: 03/06/2018   ID: Renne Cornick, DOB Nov 12, 1940, MRN [id number]  PCP: [name], MD  Primary Cardiologist: Rozann Lesches, MD   Chief Complaint  Patient presents with  . Aortic Stenosis    History of Present Illness: Dawn Gonzales is a 77 y.o. female presenting for hospital follow-up visit.  I recently met her in September.  She was recently treated for acute cholecystitis complicated by choledocholithiasis and sepsis, treated with broad-spectrum antibiotics.  MRCP demonstrated biliary ductal dilatation with a 7 mm distal common bile duct stone.  She was seen for surgical consultation however ultimately underwent endoscopic ultrasound and ERCP with successful stone extraction and biliary sphincterotomy.  She will ultimately require a cholecystectomy.  From a cardiac perspective she has severe aortic stenosis, recently assessed by echocardiogram.  LVEF is 55 to 60% with mild diastolic dysfunction and her mean aortic gradient is 51 mmHg with dimensionless index of 0.18.  Based on discussion with her, this had been a known issue prior to her moving here from [facility], and she had had prior discussion about possibility of TAVR.  She is here with her daughter today.  She states that she feels much better.  No nausea or abdominal pain.  Still has drain in place, to be taken out later this month.  She is in a wheelchair, living at home.  She reports pending surgical consultation in early December.  Past Medical History:  Diagnosis Date  . Aortic stenosis   . Depression   . History of cellulitis   . History of endocarditis   . Hyperlipemia   . Neuropathy     Past Surgical History:  Procedure Laterality Date  . Abdominal gangrene    . ADENOIDECTOMY    . Cataract surgery    . COLONOSCOPY    . ENDOSCOPIC RETROGRADE CHOLANGIOPANCREATOGRAPHY (ERCP) WITH PROPOFOL N/A 02/08/2018   Procedure: ENDOSCOPIC RETROGRADE CHOLANGIOPANCREATOGRAPHY  (ERCP) WITH PROPOFOL;  Surgeon: Rush Landmark Telford Nab., MD;  Location: [place];  Service: Gastroenterology;  Laterality: N/A;  . EUS  02/08/2018   Procedure: UPPER ENDOSCOPIC ULTRASOUND (EUS) LINEAR;  Surgeon: Irving Copas., MD;  Location: [place];  Service: Gastroenterology;;  . IR PERC CHOLECYSTOSTOMY  02/09/2018  . REMOVAL OF STONES  02/08/2018   Procedure: REMOVAL OF STONES;  Surgeon: Rush Landmark Telford Nab., MD;  Location: [place];  Service: Gastroenterology;;  . Joan Mayans  02/08/2018   Procedure: Joan Mayans;  Surgeon: Mansouraty, Telford Nab., MD;  Location: [place];  Service: Gastroenterology;;  . TONSILLECTOMY    . Tummy tuck      Current Outpatient Medications  Medication Sig Dispense Refill  . acetaminophen (TYLENOL) 325 MG tablet Take 2 tablets (650 mg total) by mouth every 6 (six) hours as needed for mild pain (or Fever >/= 101). 20 tablet 0  . Ascorbic Acid (VITAMIN C) 100 MG tablet Take 100 mg by mouth daily.    Marland Kitchen aspirin EC 81 MG tablet Take 81 mg by mouth daily.    . Calcium Carbonate (CALCIUM-CARB 600 PO) Take 600 mg by mouth daily.    . cholecalciferol (VITAMIN D) 1000 units tablet Take 1,000 Units by mouth daily.    . ferrous sulfate 325 (65 FE) MG tablet Take 325 mg by mouth daily with breakfast.    . gabapentin (NEURONTIN) 300 MG capsule Take 300 mg by mouth 3 (three) times daily.    . Multiple Vitamin (MULTIVITAMIN WITH MINERALS) TABS tablet Take 1 tablet by mouth daily.    Marland Kitchen  nystatin ointment (MYCOSTATIN) Apply 1 application topically 2 (two) times daily as needed.  2  . pravastatin (PRAVACHOL) 20 MG tablet Take 20 mg by mouth daily.    Marland Kitchen venlafaxine (EFFEXOR) 37.5 MG tablet Take 37.5 mg by mouth daily.     No current facility-administered medications for this visit.    Allergies:  Patient has no known allergies.   Social History: The patient  reports that she quit smoking about 39 years ago. She has never used smokeless  tobacco. She reports that she drinks alcohol. She reports that she does not use drugs.   ROS:  Please see the history of present illness. Otherwise, complete review of systems is positive for chronic lower leg swelling and venous stasis.  All other systems are reviewed and negative.   Physical Exam: VS:  BP (!) 142/76 (BP Location: Left Arm)   Pulse 90   Ht '5\' 6"'  (1.676 m)   Wt 265 lb (120.2 kg)   SpO2 94%   BMI 42.77 kg/m , BMI Body mass index is 42.77 kg/m.  Wt Readings from Last 3 Encounters:  03/06/18 265 lb (120.2 kg)  02/11/18 278 lb 10.6 oz (126.4 kg)  01/21/18 274 lb (124.3 kg)    General: Morbidly obese woman, in wheelchair. HEENT: Conjunctiva and lids normal, oropharynx clear. Neck: Supple, no elevated JVP, cardiac murmur radiating to the carotids, no thyromegaly. Lungs: Clear to auscultation, nonlabored breathing at rest. Cardiac: Regular rate and rhythm, no S3, 0-0/3 systolic murmur, no pericardial rub. Abdomen: Obese with pannus, nontender, bowel sounds present. Extremities: Chronic appearing bilateral lower leg edema and venous stasis, distal pulses 1+. Skin: Warm and dry. Musculoskeletal: No kyphosis. Neuropsychiatric: Alert and oriented x3, affect grossly appropriate.  ECG: I personally reviewed the tracing from 02/09/2018 which showed sinus rhythm with right bundle branch block.  Recent Labwork: 02/10/2018: ALT 17; AST 16 02/11/2018: BUN 14; Creatinine, Ser 0.80; Potassium 3.9; Sodium 138 02/12/2018: Hemoglobin 10.5; Platelets 226   Other Studies Reviewed Today:  Echocardiogram 02/01/2018: Study Conclusions  - Left ventricle: The cavity size was normal. Wall thickness was   increased in a pattern of severe LVH. Systolic function was   normal. The estimated ejection fraction was in the range of 55%   to 60%. Wall motion was normal; there were no regional wall   motion abnormalities. Doppler parameters are consistent with   abnormal left ventricular  relaxation (grade 1 diastolic   dysfunction). Doppler parameters are consistent with high   ventricular filling pressure. - Aortic valve: Severely calcified annulus. Trileaflet; severely   thickened leaflets. There was severe stenosis. Mean gradient (S):   51 mm Hg. VTI ratio of LVOT to aortic valve: 0.18. Valve area   (VTI): 0.67 cm^2. Valve area (Vmax): 0.76 cm^2. Valve area   (Vmean): 0.67 cm^2. - Mitral valve: Severely calcified annulus. The findings are   consistent with mild stenosis. Mean gradient (D): 3 mm Hg. Valve   area by pressure half-time: 2.18 cm^2. Valve area by continuity   equation (using LVOT flow): 1.8 cm^2. - Left atrium: The atrium was moderately dilated. - Atrial septum: There was increased thickness of the septum,   consistent with lipomatous hypertrophy. No defect or patent   foramen ovale was identified.  Assessment and Plan:  1.  Severe calcific aortic stenosis with recent echocardiogram showing mean gradient 51 mmHg and dimensionless index of 0.18.  She will be referred for TAVR evaluation.  Timing of this evaluation and procedure will need to  be coordinated with management of the patient's gallbladder disease.  She still has a cholecystostomy tube in place to be removed later in November with plan for surgical consultation and consideration of cholecystectomy in December.  Although perioperative cardiac risk is increased in the setting of aortic stenosis, it is not necessarily clear to me that she would have to go undergo TAVR prior to cholecystectomy.  2.  Mixed hyperlipidemia, on Pravachol.  She follows with Dr. Nevada Crane.  3.  Tobacco abuse in remission, quit smoking several years ago.  Current medicines were reviewed with the patient today.  Disposition: Referral for TAVR evaluation.  Signed, Satira Sark, MD, [facility] 03/06/2018 1:24 PM    [place] [facility] [address] [place], [place] [address] Phone: [phone];  Fax: [phone]

## 2018-03-06 ENCOUNTER — Ambulatory Visit (INDEPENDENT_AMBULATORY_CARE_PROVIDER_SITE_OTHER): Payer: Medicare PPO | Admitting: Cardiology

## 2018-03-06 ENCOUNTER — Encounter: Payer: Self-pay | Admitting: Cardiology

## 2018-03-06 VITALS — BP 142/76 | HR 90 | Ht 66.0 in | Wt 265.0 lb

## 2018-03-06 DIAGNOSIS — E782 Mixed hyperlipidemia: Secondary | ICD-10-CM | POA: Diagnosis not present

## 2018-03-06 DIAGNOSIS — I350 Nonrheumatic aortic (valve) stenosis: Secondary | ICD-10-CM

## 2018-03-06 DIAGNOSIS — F17201 Nicotine dependence, unspecified, in remission: Secondary | ICD-10-CM | POA: Diagnosis not present

## 2018-03-06 NOTE — Patient Instructions (Signed)
Medication Instructions:  Your physician recommends that you continue on your current medications as directed. Please refer to the Current Medication list given to you today.   Labwork: NONE  Testing/Procedures: NONE  Follow-Up: Your physician recommends that you schedule a follow-up appointment in: 2 MONTHS    Any Other Special Instructions Will Be Listed Below (If Applicable).     If you need a refill on your cardiac medications before your next appointment, please call your pharmacy.   

## 2018-03-07 DIAGNOSIS — F329 Major depressive disorder, single episode, unspecified: Secondary | ICD-10-CM | POA: Diagnosis not present

## 2018-03-07 DIAGNOSIS — K8043 Calculus of bile duct with acute cholecystitis with obstruction: Secondary | ICD-10-CM | POA: Diagnosis not present

## 2018-03-07 DIAGNOSIS — Z6841 Body Mass Index (BMI) 40.0 and over, adult: Secondary | ICD-10-CM | POA: Diagnosis not present

## 2018-03-07 DIAGNOSIS — I350 Nonrheumatic aortic (valve) stenosis: Secondary | ICD-10-CM | POA: Diagnosis not present

## 2018-03-07 DIAGNOSIS — G629 Polyneuropathy, unspecified: Secondary | ICD-10-CM | POA: Diagnosis not present

## 2018-03-07 DIAGNOSIS — M6281 Muscle weakness (generalized): Secondary | ICD-10-CM | POA: Diagnosis not present

## 2018-03-07 DIAGNOSIS — Z4803 Encounter for change or removal of drains: Secondary | ICD-10-CM | POA: Diagnosis not present

## 2018-03-07 DIAGNOSIS — L89152 Pressure ulcer of sacral region, stage 2: Secondary | ICD-10-CM | POA: Diagnosis not present

## 2018-03-08 ENCOUNTER — Encounter: Payer: Self-pay | Admitting: Cardiovascular Disease

## 2018-03-08 DIAGNOSIS — M6281 Muscle weakness (generalized): Secondary | ICD-10-CM | POA: Diagnosis not present

## 2018-03-08 DIAGNOSIS — K8043 Calculus of bile duct with acute cholecystitis with obstruction: Secondary | ICD-10-CM | POA: Diagnosis not present

## 2018-03-08 DIAGNOSIS — I350 Nonrheumatic aortic (valve) stenosis: Secondary | ICD-10-CM | POA: Diagnosis not present

## 2018-03-08 DIAGNOSIS — Z6841 Body Mass Index (BMI) 40.0 and over, adult: Secondary | ICD-10-CM | POA: Diagnosis not present

## 2018-03-08 DIAGNOSIS — Z4803 Encounter for change or removal of drains: Secondary | ICD-10-CM | POA: Diagnosis not present

## 2018-03-08 DIAGNOSIS — G629 Polyneuropathy, unspecified: Secondary | ICD-10-CM | POA: Diagnosis not present

## 2018-03-08 DIAGNOSIS — L89152 Pressure ulcer of sacral region, stage 2: Secondary | ICD-10-CM | POA: Diagnosis not present

## 2018-03-08 DIAGNOSIS — F329 Major depressive disorder, single episode, unspecified: Secondary | ICD-10-CM | POA: Diagnosis not present

## 2018-03-09 DIAGNOSIS — L89152 Pressure ulcer of sacral region, stage 2: Secondary | ICD-10-CM | POA: Diagnosis not present

## 2018-03-09 DIAGNOSIS — K8043 Calculus of bile duct with acute cholecystitis with obstruction: Secondary | ICD-10-CM | POA: Diagnosis not present

## 2018-03-09 DIAGNOSIS — F329 Major depressive disorder, single episode, unspecified: Secondary | ICD-10-CM | POA: Diagnosis not present

## 2018-03-09 DIAGNOSIS — G629 Polyneuropathy, unspecified: Secondary | ICD-10-CM | POA: Diagnosis not present

## 2018-03-09 DIAGNOSIS — Z6841 Body Mass Index (BMI) 40.0 and over, adult: Secondary | ICD-10-CM | POA: Diagnosis not present

## 2018-03-09 DIAGNOSIS — M6281 Muscle weakness (generalized): Secondary | ICD-10-CM | POA: Diagnosis not present

## 2018-03-09 DIAGNOSIS — I350 Nonrheumatic aortic (valve) stenosis: Secondary | ICD-10-CM | POA: Diagnosis not present

## 2018-03-09 DIAGNOSIS — Z4803 Encounter for change or removal of drains: Secondary | ICD-10-CM | POA: Diagnosis not present

## 2018-03-11 ENCOUNTER — Telehealth: Payer: Self-pay | Admitting: Cardiovascular Disease

## 2018-03-11 ENCOUNTER — Encounter: Payer: Self-pay | Admitting: Cardiovascular Disease

## 2018-03-11 ENCOUNTER — Ambulatory Visit (INDEPENDENT_AMBULATORY_CARE_PROVIDER_SITE_OTHER): Payer: Medicare PPO | Admitting: Cardiovascular Disease

## 2018-03-11 VITALS — BP 140/82 | HR 80 | Ht 66.0 in | Wt 286.0 lb

## 2018-03-11 DIAGNOSIS — I350 Nonrheumatic aortic (valve) stenosis: Secondary | ICD-10-CM

## 2018-03-11 DIAGNOSIS — Z6841 Body Mass Index (BMI) 40.0 and over, adult: Secondary | ICD-10-CM | POA: Diagnosis not present

## 2018-03-11 DIAGNOSIS — L89152 Pressure ulcer of sacral region, stage 2: Secondary | ICD-10-CM | POA: Diagnosis not present

## 2018-03-11 DIAGNOSIS — M6281 Muscle weakness (generalized): Secondary | ICD-10-CM | POA: Diagnosis not present

## 2018-03-11 DIAGNOSIS — G629 Polyneuropathy, unspecified: Secondary | ICD-10-CM | POA: Diagnosis not present

## 2018-03-11 DIAGNOSIS — K8043 Calculus of bile duct with acute cholecystitis with obstruction: Secondary | ICD-10-CM | POA: Diagnosis not present

## 2018-03-11 DIAGNOSIS — F329 Major depressive disorder, single episode, unspecified: Secondary | ICD-10-CM | POA: Diagnosis not present

## 2018-03-11 DIAGNOSIS — Z4803 Encounter for change or removal of drains: Secondary | ICD-10-CM | POA: Diagnosis not present

## 2018-03-11 NOTE — Progress Notes (Addendum)
Valve Clinic Consult Note  Chief Complaint  Patient presents with  . New Patient (Initial Visit)    Severe aortic stenosis   History of Present Illness: 77 yo female with history of morbid obesity, hyperlipidemia, former tobacco abuse, prior mitral valve endocarditis, diastolic CHF, TIA in 4656, cephalic vein thrombosis 8127, arthritis and severe aortic stenosis here today as a new patient in the valve clinic to discuss her aortic stenosis and possible TAVR. She is referred by Dr. Domenic Polite. She has been followed for aortic stenosis while living in [facility] and recently moved to [place] to be near her family. I was able to get the records from [facility]. She was treated for mitral valve endocarditis in the setting of cellulitis in February 2019 in [place], [place]. She was also found to have severe aortic stenosis. She was seen there by the TAVR team. (Dr. Louis Meckel of CT surgery). She had a cardiac catheterization in [place], [place] on 06/20/17. By report there was no evidence of CAD. Right heart pressures (RA 10, RV 45/9, PA: 53/13 PW: 16 AO: 108/67, LV 173/15/25. CO 7.24 L/min, CI: 4.4 L/min/m2, AVA 1.08 cm2, AV gadient 45 mmHg). This cath report will be scanned into the [facility] record. Echo 04/13/17 in [place], [place] with AVA 0.9 cm2, mean gradient 44 mmHg, peak gradient 74 mmHg. Reports from that hospitalization mention heavy MAC with mean gradient 7 mmHg. She had a CTA of her abdomen and pelvis but this report is not included. She was noted to have bilateral femoral artery access for potential TAVR by Dr. Hardin Negus. At that time, she was being treated for choledocholithiasis and had a recent drain in place. ERCP had been performed 05/24/17 and her drain was removed 06/15/17. It is not clear if she followed up with TAVR team in [place] at [facility] prior to moving to [facility]. She saw Dr. Domenic Polite in September 2019 and he arranged an echo which showed normal LV systolic  function with severe LVH and severe aortic stenosis (mean gradient 51 mmHg, DVI 0.18, AVA 0.69 cm2). She was admitted to [facility] 02/06/18 with recurrent acute cholecystitis complicated by choledocholithiasis and sepsis, treated with broad-spectrum antibiotics.  MRCP demonstrated biliary ductal dilatation with a 7 mm distal common bile duct stone.  She was seen for surgical consultation however ultimately underwent endoscopic ultrasound and ERCP with successful stone extraction and biliary sphincterotomy.  She will ultimately require a cholecystectomy. She still has a drain in place.   She is immobile and primarily confined to a wheelchair due to hip and knee pain. She denies chest pain, dyspnea or dizziness. She has chronic lower extremity edema. She has not seen a dentist in years. She lives with her husband in [place]. Her daughter lives nearby.   Primary Care Physician: [name], MD Primary Cardiologist: Rozann Lesches Referring Cardiologist: Rozann Lesches  Past Medical History:  Diagnosis Date  . Aortic stenosis   . Depression   . History of cellulitis   . History of endocarditis   . Hyperlipemia   . Neuropathy     Past Surgical History:  Procedure Laterality Date  . Abdominal gangrene    . ADENOIDECTOMY    . Cataract surgery    . COLONOSCOPY    . ENDOSCOPIC RETROGRADE CHOLANGIOPANCREATOGRAPHY (ERCP) WITH PROPOFOL N/A 02/08/2018   Procedure: ENDOSCOPIC RETROGRADE CHOLANGIOPANCREATOGRAPHY (ERCP) WITH PROPOFOL;  Surgeon: Rush Landmark Telford Nab., MD;  Location: [place];  Service: Gastroenterology;  Laterality: N/A;  .  EUS  02/08/2018   Procedure: UPPER ENDOSCOPIC ULTRASOUND (EUS) LINEAR;  Surgeon: Irving Copas., MD;  Location: [place];  Service: Gastroenterology;;  . IR PERC CHOLECYSTOSTOMY  02/09/2018  . REMOVAL OF STONES  02/08/2018   Procedure: REMOVAL OF STONES;  Surgeon: Rush Landmark Telford Nab., MD;  Location: [place];  Service:  Gastroenterology;;  . Joan Mayans  02/08/2018   Procedure: Joan Mayans;  Surgeon: Mansouraty, Telford Nab., MD;  Location: [place];  Service: Gastroenterology;;  . TONSILLECTOMY    . Tummy tuck      Current Outpatient Medications  Medication Sig Dispense Refill  . acetaminophen (TYLENOL) 325 MG tablet Take 2 tablets (650 mg total) by mouth every 6 (six) hours as needed for mild pain (or Fever >/= 101). 20 tablet 0  . Ascorbic Acid (VITAMIN C) 100 MG tablet Take 100 mg by mouth daily.    Marland Kitchen aspirin EC 81 MG tablet Take 81 mg by mouth daily.    . Calcium Carbonate (CALCIUM-CARB 600 PO) Take 600 mg by mouth daily.    . cholecalciferol (VITAMIN D) 1000 units tablet Take 1,000 Units by mouth daily.    . ferrous sulfate 325 (65 FE) MG tablet Take 325 mg by mouth daily with breakfast.    . gabapentin (NEURONTIN) 300 MG capsule Take 300 mg by mouth 3 (three) times daily.    . Multiple Vitamin (MULTIVITAMIN WITH MINERALS) TABS tablet Take 1 tablet by mouth daily.    Marland Kitchen nystatin ointment (MYCOSTATIN) Apply 1 application topically 2 (two) times daily as needed.  2  . pravastatin (PRAVACHOL) 20 MG tablet Take 20 mg by mouth daily.    Marland Kitchen venlafaxine (EFFEXOR) 37.5 MG tablet Take 37.5 mg by mouth daily.     No current facility-administered medications for this visit.     No Known Allergies  Social History   Socioeconomic History  . Marital status: Married    Spouse name: Not on file  . Number of children: 3  . Years of education: Not on file  . Highest education level: Not on file  Occupational History  . Occupation: Retired Cytogeneticist  . Financial resource strain: Not on file  . Food insecurity:    Worry: Not on file    Inability: Not on file  . Transportation needs:    Medical: Not on file    Non-medical: Not on file  Tobacco Use  . Smoking status: Former Smoker    Types: Cigarettes    Last attempt to quit: 05/23/1978    Years since quitting: 39.8  . Smokeless  tobacco: Never Used  . Tobacco comment: only 1 pack pewr week   Substance and Sexual Activity  . Alcohol use: Yes    Comment: Occasional  . Drug use: Never  . Sexual activity: Not on file  Lifestyle  . Physical activity:    Days per week: Not on file    Minutes per session: Not on file  . Stress: Not on file  Relationships  . Social connections:    Talks on phone: Not on file    Gets together: Not on file    Attends religious service: Not on file    Active member of club or organization: Not on file    Attends meetings of clubs or organizations: Not on file    Relationship status: Not on file  . Intimate partner violence:    Fear of current or ex partner: Not on file    Emotionally abused: Not  on file    Physically abused: Not on file    Forced sexual activity: Not on file  Other Topics Concern  . Not on file  Social History Narrative  . Not on file    Family History  Problem Relation Age of Onset  . Cancer Mother   . Hypertension Father   . Arthritis Father   . Heart attack Father     Review of Systems:  As stated in the HPI and otherwise negative.   BP 140/82   Pulse 80   Ht _0  (1.676 m)   Wt 286 lb (129.7 kg)   SpO2 92%   BMI 46.16 kg/m   Physical Examination: General: Well developed, well nourished, NAD  HEENT: OP clear, mucus membranes moist  SKIN: warm, dry. No rashes. Neuro: No focal deficits  Musculoskeletal: Muscle strength 5/5 all ext  Psychiatric: Mood and affect normal  Neck: No JVD, no carotid bruits, no thyromegaly, no lymphadenopathy.  Lungs:Clear bilaterally, no wheezes, rhonci, crackles Cardiovascular: Regular rate and rhythm. Loud, harsh systolic murmur.  Abdomen:Soft. Bowel sounds present. Non-tender.  Extremities: 1-2+ bilateral lower extremity edema. Chronic venous stasis changes.   Echo 02/01/18: Left ventricle: The cavity size was normal. Wall thickness was   increased in a pattern of severe LVH. Systolic function was   normal.  The estimated ejection fraction was in the range of 55%   to 60%. Wall motion was normal; there were no regional wall   motion abnormalities. Doppler parameters are consistent with   abnormal left ventricular relaxation (grade 1 diastolic   dysfunction). Doppler parameters are consistent with high   ventricular filling pressure. - Aortic valve: Severely calcified annulus. Trileaflet; severely   thickened leaflets. There was severe stenosis. Mean gradient (S):   51 mm Hg. VTI ratio of LVOT to aortic valve: 0.18. Valve area   (VTI): 0.67 cm^2. Valve area (Vmax): 0.76 cm^2. Valve area   (Vmean): 0.67 cm^2. - Mitral valve: Severely calcified annulus. The findings are   consistent with mild stenosis. Mean gradient (D): 3 mm Hg. Valve   area by pressure half-time: 2.18 cm^2. Valve area by continuity   equation (using LVOT flow): 1.8 cm^2. - Left atrium: The atrium was moderately dilated. - Atrial septum: There was increased thickness of the septum,   consistent with lipomatous hypertrophy. No defect or patent   foramen ovale was identified.  ------------------------------------------------------------------- Study data:   Study status:  Routine.  Procedure:  Transthoracic echocardiography. Image quality was adequate.  Study completion: There were no complications.          Transthoracic echocardiography.  M-mode, complete 2D, spectral Doppler, and color Doppler.  Birthdate:  Patient birthdate: [birth year].  Age:  Patient is 77 yr old.  Sex:  Gender: female.    BMI: 44.3 kg/m^2.  Blood pressure:     156/88  Patient status:  Outpatient.  Study date: Study date: 02/01/2018. Study time: 01:18 PM.  Location:  Echo laboratory.  -------------------------------------------------------------------  ------------------------------------------------------------------- Left ventricle:  The cavity size was normal. Wall thickness was increased in a pattern of severe LVH. Systolic function was  normal. The estimated ejection fraction was in the range of 55% to 60%. Wall motion was normal; there were no regional wall motion abnormalities. Doppler parameters are consistent with abnormal left ventricular relaxation (grade 1 diastolic dysfunction). Doppler parameters are consistent with high ventricular filling pressure.   ------------------------------------------------------------------- Aortic valve:   Severely calcified annulus. Trileaflet; severely thickened leaflets.  Doppler:  There was severe stenosis.   There was no significant regurgitation.    VTI ratio of LVOT to aortic valve: 0.18. Valve area (VTI): 0.67 cm^2. Indexed valve area (VTI): 0.27 cm^2/m^2. Peak velocity ratio of LVOT to aortic valve: 0.2. Valve area (Vmax): 0.76 cm^2. Indexed valve area (Vmax): 0.31 cm^2/m^2. Mean velocity ratio of LVOT to aortic valve: 0.18. Valve area (Vmean): 0.67 cm^2. Indexed valve area (Vmean): 0.27 cm^2/m^2.    Mean gradient (S): 51 mm Hg. Peak gradient (S): 80 mm Hg.  ------------------------------------------------------------------- Aorta:  Aortic root: The aortic root was normal in size.  ------------------------------------------------------------------- Mitral valve:   Severely calcified annulus.  Doppler:   The findings are consistent with mild stenosis.   There was no significant regurgitation.    Valve area by pressure half-time: 2.18 cm^2. Indexed valve area by pressure half-time: 0.88 cm^2/m^2. Valve area by continuity equation (using LVOT flow): 1.8 cm^2. Indexed valve area by continuity equation (using LVOT flow): 0.73 cm^2/m^2.    Mean gradient (D): 3 mm Hg. Peak gradient (D): 3 mm Hg.  ------------------------------------------------------------------- Left atrium:  The atrium was moderately dilated.  ------------------------------------------------------------------- Atrial septum:  There was increased thickness of the septum, consistent with lipomatous  hypertrophy. No defect or patent foramen ovale was identified.  ------------------------------------------------------------------- Right ventricle:  The cavity size was normal. Wall thickness was normal. Systolic function was normal.  ------------------------------------------------------------------- Pulmonic valve:   Not well visualized.  Doppler:   There was no evidence for stenosis.   There was no significant regurgitation.   ------------------------------------------------------------------- Tricuspid valve:   Normal thickness leaflets.  Doppler:   There was no evidence for stenosis.   There was no significant regurgitation.   ------------------------------------------------------------------- Pulmonary artery:    Systolic pressure could not be accurately estimated.   Inadequate TR jet.  ------------------------------------------------------------------- Right atrium:  The atrium was normal in size.  ------------------------------------------------------------------- Pericardium:  There was no pericardial effusion.  ------------------------------------------------------------------- Systemic veins: Inferior vena cava: The vessel was normal in size. The respirophasic diameter changes were in the normal range (>= 50%), consistent with normal central venous pressure.  ------------------------------------------------------------------- Measurements   Left ventricle                          Value           Reference  LV ID, ED, PLAX chordal         (L)     41.1   mm       43 - 52  LV ID, ES, PLAX chordal                 23.5   mm       23 - 38  LV fx shortening, PLAX chordal          43     %        >=29  LV PW thickness, ED                     15     mm       ----------  IVS/LV PW ratio, ED                     1.01            <=1.3  Stroke volume, 2D  71     ml       ----------  Stroke volume/bsa, 2D                   29     ml/m^2   ----------   LV e&', lateral                          6.74   cm/s     ----------  LV E/e&', lateral                        13.61           ----------  LV e&', medial                           4.13   cm/s     ----------  LV E/e&', medial                         22.2            ----------  LV e&', average                          5.44   cm/s     ----------  LV E/e&', average                        16.87           ----------  LV ejection time                        300    ms       ----------    Ventricular septum                      Value           Reference  IVS thickness, ED                       15.2   mm       ----------    LVOT                                    Value           Reference  LVOT ID, S                              22     mm       ----------  LVOT area                               3.8    cm^2     ----------  LVOT peak velocity, S                   89.04  cm/s     ----------  LVOT mean velocity, S                   59.6   cm/s     ----------  LVOT VTI, S  17.59  cm       ----------  Stroke volume (SV), LVOT DP             66.9   ml       ----------  Stroke index (SV/bsa), LVOT DP          27     ml/m^2   ----------    Aortic valve                            Value           Reference  Aortic valve peak velocity, S           446.38 cm/s     ----------  Aortic valve mean velocity, S           340.05 cm/s     ----------  Aortic valve VTI, S                     99.38  cm       ----------  Aortic mean gradient, S                 51     mm Hg    ----------  Aortic peak gradient, S                 80     mm Hg    ----------  VTI ratio, LVOT/AV                      0.18            ----------  Aortic valve area, VTI                  0.67   cm^2     ----------  Aortic valve area/bsa, VTI              0.27   cm^2/m^2 ----------  Velocity ratio, peak, LVOT/AV           0.2             ----------  Aortic valve area, peak                 0.76   cm^2     ----------   velocity  Aortic valve area/bsa, peak             0.31   cm^2/m^2 ----------  velocity  Velocity ratio, mean, LVOT/AV           0.18            ----------  Aortic valve area, mean                 0.67   cm^2     ----------  velocity  Aortic valve area/bsa, mean             0.27   cm^2/m^2 ----------  velocity    Aorta                                   Value           Reference  Aortic root ID, ED                      35     mm       ----------  Left atrium                             Value           Reference  LA ID, A-P, ES                          38     mm       ----------  LA ID/bsa, A-P                          1.54   cm/m^2   <=2.2  LA volume, S                            88     ml       ----------  LA volume/bsa, S                        35.6   ml/m^2   ----------  LA volume, ES, 1-p A4C                  82.6   ml       ----------  LA volume/bsa, ES, 1-p A4C              33.4   ml/m^2   ----------  LA volume, ES, 1-p A2C                  91.4   ml       ----------  LA volume/bsa, ES, 1-p A2C              37     ml/m^2   ----------    Mitral valve                            Value           Reference  Mitral E-wave peak velocity             91.7   cm/s     ----------  Mitral A-wave peak velocity             118    cm/s     ----------  Mitral mean velocity, D                 80.7   cm/s     ----------  Mitral deceleration time        (H)     268    ms       150 - 230  Mitral pressure half-time               101    ms       ----------  Mitral mean gradient, D                 3      mm Hg    ----------  Mitral peak gradient, D                 3      mm Hg    ----------  Mitral E/A ratio, peak                  0.8             ----------  Mitral valve area, PHT, DP              2.18   cm^2     ----------  Mitral valve area/bsa, PHT, DP          0.88   cm^2/m^2 ----------  Mitral valve area, LVOT                 1.8    cm^2     ----------  continuity  Mitral valve area/bsa, LVOT              0.73   cm^2/m^2 ----------  continuity  Mitral annulus VTI, D                   39.2   cm       ----------    Right atrium                            Value           Reference  RA ID, S-I, ES, A4C             (H)     58.9   mm       34 - 49  RA area, ES, A4C                        18     cm^2     8.3 - 19.5  RA volume, ES, A/L                      47.5   ml       ----------  RA volume/bsa, ES, A/L                  19.2   ml/m^2   ----------    Systemic veins                          Value           Reference  Estimated CVP                           3      mm Hg    ----------    Right ventricle                         Value           Reference  TAPSE                                   21.4   mm       ----------  RV s&', lateral, S                       14.3   cm/s     ----------  EKG:  EKG is not ordered today. The ekg ordered today demonstrates   Recent Labs: 02/10/2018: ALT 17 02/11/2018: BUN 14; Creatinine, Ser 0.80; Potassium 3.9; Sodium 138 02/12/2018: Hemoglobin 10.5; Platelets 226   Lipid Panel No results found for: CHOL, TRIG, HDL, CHOLHDL, VLDL, LDLCALC, LDLDIRECT   Wt Readings from Last 3 Encounters:  03/11/18 286 lb (129.7 kg)  03/06/18 265  lb (120.2 kg)  02/11/18 278 lb 10.6 oz (126.4 kg)     Other studies Reviewed: Additional studies/ records that were reviewed today include: . Review of the above records demonstrates:    Assessment and Plan:   1. Severe aortic stenosis: She has severe aortic stenosis but at this time is relatively asymptomatic. I suspect her lack of symptoms is due to the fact that she is sedentary most of the day. I have personally reviewed the echo images. The aortic valve is thickened, calcified with limited leaflet mobility. I think she would benefit from AVR. Given advanced age and morbid obesity, she is not a good candidate for conventional AVR by surgical approach. I think she may be a good candidate for TAVR.   STS Risk  Score: Isolated AVR Risk of Mortality: 1.737%  Renal Failure: 1.792%  Permanent Stroke: 1.055%  Prolonged Ventilation: 7.177%  DSW Infection: 0.247%  Reoperation: 3.097%  Morbidity or Mortality: 10.990%  Short Length of Stay: 28.133%  Long Length of Stay: 6.074%  I have reviewed the natural history of aortic stenosis with the patient and their family members  who are present today. We have discussed the limitations of medical therapy and the poor prognosis associated with symptomatic aortic stenosis. We have reviewed potential treatment options, including palliative medical therapy, conventional surgical aortic valve replacement, and transcatheter aortic valve replacement. We discussed treatment options in the context of the patient's specific comorbid medical conditions.   She would like to proceed with planning for TAVR. She has had a cardiac cath in [place], [place] in February 2019. She will not need a repeat cath. Given ongoing issues with her gallbladder, I think it is best to delay TAVR for now. I think she can safely proceed with her cholecystectomy. I will see her back in January 2020 after her cholecystectomy and we will then plan her cardiac cath if needed, the cardiac CT, CTA of the chest/abdomen and pelvis, PFTs, carotid dopplers and she will then be referred to see one of the CT surgeons on our TAVR team. She will also need a dental consult following the resolution of her gallbladder issues.   Current medicines are reviewed at length with the patient today.  The patient does not have concerns regarding medicines.  The following changes have been made:  no change  Labs/ tests ordered today include:  No orders of the defined types were placed in this encounter.    Disposition:   FU with me in January 2020    Signed, Lauree Chandler, MD 03/11/2018 1:57 PM    [facility] [address], [place], [place]  [address] Phone: [phone]; Fax: [phone]

## 2018-03-11 NOTE — Patient Instructions (Signed)
Medication Instructions:  Your physician recommends that you continue on your current medications as directed. Please refer to the Current Medication list given to you today.  If you need a refill on your cardiac medications before your next appointment, please call your pharmacy.   Lab work: none If you have labs (blood work) drawn today and your tests are completely normal, you will receive your results only by: Marland Kitchen MyChart Message (if you have MyChart) OR . A paper copy in the mail If you have any lab test that is abnormal or we need to change your treatment, we will call you to review the results.  Testing/Procedures: none  Follow-Up: You are scheduled to see Dr. Clifton James on May 10, 2018 at 9:30

## 2018-03-11 NOTE — Telephone Encounter (Signed)
Records received from [facility]. Placed in Chart Prep

## 2018-03-12 ENCOUNTER — Telehealth: Payer: Self-pay

## 2018-03-12 DIAGNOSIS — L89152 Pressure ulcer of sacral region, stage 2: Secondary | ICD-10-CM | POA: Diagnosis not present

## 2018-03-12 DIAGNOSIS — Z6841 Body Mass Index (BMI) 40.0 and over, adult: Secondary | ICD-10-CM | POA: Diagnosis not present

## 2018-03-12 DIAGNOSIS — K8043 Calculus of bile duct with acute cholecystitis with obstruction: Secondary | ICD-10-CM | POA: Diagnosis not present

## 2018-03-12 DIAGNOSIS — M6281 Muscle weakness (generalized): Secondary | ICD-10-CM | POA: Diagnosis not present

## 2018-03-12 DIAGNOSIS — F329 Major depressive disorder, single episode, unspecified: Secondary | ICD-10-CM | POA: Diagnosis not present

## 2018-03-12 DIAGNOSIS — Z4803 Encounter for change or removal of drains: Secondary | ICD-10-CM | POA: Diagnosis not present

## 2018-03-12 DIAGNOSIS — G629 Polyneuropathy, unspecified: Secondary | ICD-10-CM | POA: Diagnosis not present

## 2018-03-12 DIAGNOSIS — I350 Nonrheumatic aortic (valve) stenosis: Secondary | ICD-10-CM | POA: Diagnosis not present

## 2018-03-12 NOTE — Telephone Encounter (Signed)
PLACED NOTES IN DOCTOR'S BOX

## 2018-03-13 DIAGNOSIS — F331 Major depressive disorder, recurrent, moderate: Secondary | ICD-10-CM | POA: Diagnosis not present

## 2018-03-13 DIAGNOSIS — K8043 Calculus of bile duct with acute cholecystitis with obstruction: Secondary | ICD-10-CM | POA: Diagnosis not present

## 2018-03-13 DIAGNOSIS — M6281 Muscle weakness (generalized): Secondary | ICD-10-CM | POA: Diagnosis not present

## 2018-03-13 DIAGNOSIS — Z4803 Encounter for change or removal of drains: Secondary | ICD-10-CM | POA: Diagnosis not present

## 2018-03-13 DIAGNOSIS — F329 Major depressive disorder, single episode, unspecified: Secondary | ICD-10-CM | POA: Diagnosis not present

## 2018-03-13 DIAGNOSIS — G629 Polyneuropathy, unspecified: Secondary | ICD-10-CM | POA: Diagnosis not present

## 2018-03-13 DIAGNOSIS — L89152 Pressure ulcer of sacral region, stage 2: Secondary | ICD-10-CM | POA: Diagnosis not present

## 2018-03-13 DIAGNOSIS — I350 Nonrheumatic aortic (valve) stenosis: Secondary | ICD-10-CM | POA: Diagnosis not present

## 2018-03-13 DIAGNOSIS — Z6841 Body Mass Index (BMI) 40.0 and over, adult: Secondary | ICD-10-CM | POA: Diagnosis not present

## 2018-03-13 DIAGNOSIS — Z7409 Other reduced mobility: Secondary | ICD-10-CM | POA: Diagnosis not present

## 2018-03-13 DIAGNOSIS — E782 Mixed hyperlipidemia: Secondary | ICD-10-CM | POA: Diagnosis not present

## 2018-03-13 DIAGNOSIS — G4733 Obstructive sleep apnea (adult) (pediatric): Secondary | ICD-10-CM | POA: Diagnosis not present

## 2018-03-13 DIAGNOSIS — Z993 Dependence on wheelchair: Secondary | ICD-10-CM | POA: Diagnosis not present

## 2018-03-13 DIAGNOSIS — K8000 Calculus of gallbladder with acute cholecystitis without obstruction: Secondary | ICD-10-CM | POA: Diagnosis not present

## 2018-03-14 DIAGNOSIS — F329 Major depressive disorder, single episode, unspecified: Secondary | ICD-10-CM | POA: Diagnosis not present

## 2018-03-14 DIAGNOSIS — G629 Polyneuropathy, unspecified: Secondary | ICD-10-CM | POA: Diagnosis not present

## 2018-03-14 DIAGNOSIS — L89152 Pressure ulcer of sacral region, stage 2: Secondary | ICD-10-CM | POA: Diagnosis not present

## 2018-03-14 DIAGNOSIS — Z4803 Encounter for change or removal of drains: Secondary | ICD-10-CM | POA: Diagnosis not present

## 2018-03-14 DIAGNOSIS — K8043 Calculus of bile duct with acute cholecystitis with obstruction: Secondary | ICD-10-CM | POA: Diagnosis not present

## 2018-03-14 DIAGNOSIS — I350 Nonrheumatic aortic (valve) stenosis: Secondary | ICD-10-CM | POA: Diagnosis not present

## 2018-03-14 DIAGNOSIS — Z6841 Body Mass Index (BMI) 40.0 and over, adult: Secondary | ICD-10-CM | POA: Diagnosis not present

## 2018-03-14 DIAGNOSIS — M6281 Muscle weakness (generalized): Secondary | ICD-10-CM | POA: Diagnosis not present

## 2018-03-18 DIAGNOSIS — L89152 Pressure ulcer of sacral region, stage 2: Secondary | ICD-10-CM | POA: Diagnosis not present

## 2018-03-18 DIAGNOSIS — I350 Nonrheumatic aortic (valve) stenosis: Secondary | ICD-10-CM | POA: Diagnosis not present

## 2018-03-18 DIAGNOSIS — M6281 Muscle weakness (generalized): Secondary | ICD-10-CM | POA: Diagnosis not present

## 2018-03-18 DIAGNOSIS — K8043 Calculus of bile duct with acute cholecystitis with obstruction: Secondary | ICD-10-CM | POA: Diagnosis not present

## 2018-03-18 DIAGNOSIS — Z4803 Encounter for change or removal of drains: Secondary | ICD-10-CM | POA: Diagnosis not present

## 2018-03-18 DIAGNOSIS — F329 Major depressive disorder, single episode, unspecified: Secondary | ICD-10-CM | POA: Diagnosis not present

## 2018-03-18 DIAGNOSIS — G629 Polyneuropathy, unspecified: Secondary | ICD-10-CM | POA: Diagnosis not present

## 2018-03-18 DIAGNOSIS — Z6841 Body Mass Index (BMI) 40.0 and over, adult: Secondary | ICD-10-CM | POA: Diagnosis not present

## 2018-03-19 DIAGNOSIS — Z6841 Body Mass Index (BMI) 40.0 and over, adult: Secondary | ICD-10-CM | POA: Diagnosis not present

## 2018-03-19 DIAGNOSIS — G629 Polyneuropathy, unspecified: Secondary | ICD-10-CM | POA: Diagnosis not present

## 2018-03-19 DIAGNOSIS — Z4803 Encounter for change or removal of drains: Secondary | ICD-10-CM | POA: Diagnosis not present

## 2018-03-19 DIAGNOSIS — M6281 Muscle weakness (generalized): Secondary | ICD-10-CM | POA: Diagnosis not present

## 2018-03-19 DIAGNOSIS — F329 Major depressive disorder, single episode, unspecified: Secondary | ICD-10-CM | POA: Diagnosis not present

## 2018-03-19 DIAGNOSIS — I350 Nonrheumatic aortic (valve) stenosis: Secondary | ICD-10-CM | POA: Diagnosis not present

## 2018-03-19 DIAGNOSIS — L89152 Pressure ulcer of sacral region, stage 2: Secondary | ICD-10-CM | POA: Diagnosis not present

## 2018-03-19 DIAGNOSIS — K8043 Calculus of bile duct with acute cholecystitis with obstruction: Secondary | ICD-10-CM | POA: Diagnosis not present

## 2018-03-20 DIAGNOSIS — L89152 Pressure ulcer of sacral region, stage 2: Secondary | ICD-10-CM | POA: Diagnosis not present

## 2018-03-20 DIAGNOSIS — Z6841 Body Mass Index (BMI) 40.0 and over, adult: Secondary | ICD-10-CM | POA: Diagnosis not present

## 2018-03-20 DIAGNOSIS — Z4803 Encounter for change or removal of drains: Secondary | ICD-10-CM | POA: Diagnosis not present

## 2018-03-20 DIAGNOSIS — F329 Major depressive disorder, single episode, unspecified: Secondary | ICD-10-CM | POA: Diagnosis not present

## 2018-03-20 DIAGNOSIS — G629 Polyneuropathy, unspecified: Secondary | ICD-10-CM | POA: Diagnosis not present

## 2018-03-20 DIAGNOSIS — M6281 Muscle weakness (generalized): Secondary | ICD-10-CM | POA: Diagnosis not present

## 2018-03-20 DIAGNOSIS — I350 Nonrheumatic aortic (valve) stenosis: Secondary | ICD-10-CM | POA: Diagnosis not present

## 2018-03-20 DIAGNOSIS — K8043 Calculus of bile duct with acute cholecystitis with obstruction: Secondary | ICD-10-CM | POA: Diagnosis not present

## 2018-03-21 DIAGNOSIS — Z4803 Encounter for change or removal of drains: Secondary | ICD-10-CM | POA: Diagnosis not present

## 2018-03-21 DIAGNOSIS — L89152 Pressure ulcer of sacral region, stage 2: Secondary | ICD-10-CM | POA: Diagnosis not present

## 2018-03-21 DIAGNOSIS — K8043 Calculus of bile duct with acute cholecystitis with obstruction: Secondary | ICD-10-CM | POA: Diagnosis not present

## 2018-03-21 DIAGNOSIS — I350 Nonrheumatic aortic (valve) stenosis: Secondary | ICD-10-CM | POA: Diagnosis not present

## 2018-03-21 DIAGNOSIS — Z6841 Body Mass Index (BMI) 40.0 and over, adult: Secondary | ICD-10-CM | POA: Diagnosis not present

## 2018-03-21 DIAGNOSIS — M6281 Muscle weakness (generalized): Secondary | ICD-10-CM | POA: Diagnosis not present

## 2018-03-21 DIAGNOSIS — G629 Polyneuropathy, unspecified: Secondary | ICD-10-CM | POA: Diagnosis not present

## 2018-03-21 DIAGNOSIS — F329 Major depressive disorder, single episode, unspecified: Secondary | ICD-10-CM | POA: Diagnosis not present

## 2018-03-22 DIAGNOSIS — Z4803 Encounter for change or removal of drains: Secondary | ICD-10-CM | POA: Diagnosis not present

## 2018-03-22 DIAGNOSIS — M6281 Muscle weakness (generalized): Secondary | ICD-10-CM | POA: Diagnosis not present

## 2018-03-22 DIAGNOSIS — L89152 Pressure ulcer of sacral region, stage 2: Secondary | ICD-10-CM | POA: Diagnosis not present

## 2018-03-22 DIAGNOSIS — F329 Major depressive disorder, single episode, unspecified: Secondary | ICD-10-CM | POA: Diagnosis not present

## 2018-03-22 DIAGNOSIS — I350 Nonrheumatic aortic (valve) stenosis: Secondary | ICD-10-CM | POA: Diagnosis not present

## 2018-03-22 DIAGNOSIS — K8043 Calculus of bile duct with acute cholecystitis with obstruction: Secondary | ICD-10-CM | POA: Diagnosis not present

## 2018-03-22 DIAGNOSIS — G629 Polyneuropathy, unspecified: Secondary | ICD-10-CM | POA: Diagnosis not present

## 2018-03-22 DIAGNOSIS — Z6841 Body Mass Index (BMI) 40.0 and over, adult: Secondary | ICD-10-CM | POA: Diagnosis not present

## 2018-03-25 DIAGNOSIS — G629 Polyneuropathy, unspecified: Secondary | ICD-10-CM | POA: Diagnosis not present

## 2018-03-25 DIAGNOSIS — Z6841 Body Mass Index (BMI) 40.0 and over, adult: Secondary | ICD-10-CM | POA: Diagnosis not present

## 2018-03-25 DIAGNOSIS — I350 Nonrheumatic aortic (valve) stenosis: Secondary | ICD-10-CM | POA: Diagnosis not present

## 2018-03-25 DIAGNOSIS — F329 Major depressive disorder, single episode, unspecified: Secondary | ICD-10-CM | POA: Diagnosis not present

## 2018-03-25 DIAGNOSIS — K8043 Calculus of bile duct with acute cholecystitis with obstruction: Secondary | ICD-10-CM | POA: Diagnosis not present

## 2018-03-25 DIAGNOSIS — L89152 Pressure ulcer of sacral region, stage 2: Secondary | ICD-10-CM | POA: Diagnosis not present

## 2018-03-25 DIAGNOSIS — Z4803 Encounter for change or removal of drains: Secondary | ICD-10-CM | POA: Diagnosis not present

## 2018-03-25 DIAGNOSIS — M6281 Muscle weakness (generalized): Secondary | ICD-10-CM | POA: Diagnosis not present

## 2018-03-26 ENCOUNTER — Encounter: Payer: Self-pay | Admitting: Radiology

## 2018-03-26 ENCOUNTER — Telehealth: Payer: Self-pay | Admitting: Radiology

## 2018-03-26 ENCOUNTER — Ambulatory Visit
Admission: RE | Admit: 2018-03-26 | Discharge: 2018-03-26 | Disposition: A | Discharge status: Home / Self Care | Payer: Medicare PPO | Source: Ambulatory Visit | Attending: Student | Admitting: Student

## 2018-03-26 ENCOUNTER — Other Ambulatory Visit (HOSPITAL_COMMUNITY): Payer: Self-pay | Admitting: Interventional Radiology

## 2018-03-26 DIAGNOSIS — G629 Polyneuropathy, unspecified: Secondary | ICD-10-CM | POA: Diagnosis not present

## 2018-03-26 DIAGNOSIS — K819 Cholecystitis, unspecified: Secondary | ICD-10-CM | POA: Diagnosis not present

## 2018-03-26 DIAGNOSIS — K8043 Calculus of bile duct with acute cholecystitis with obstruction: Secondary | ICD-10-CM | POA: Diagnosis not present

## 2018-03-26 DIAGNOSIS — Z4803 Encounter for change or removal of drains: Secondary | ICD-10-CM | POA: Diagnosis not present

## 2018-03-26 DIAGNOSIS — I350 Nonrheumatic aortic (valve) stenosis: Secondary | ICD-10-CM | POA: Diagnosis not present

## 2018-03-26 DIAGNOSIS — L89152 Pressure ulcer of sacral region, stage 2: Secondary | ICD-10-CM | POA: Diagnosis not present

## 2018-03-26 DIAGNOSIS — Z9049 Acquired absence of other specified parts of digestive tract: Secondary | ICD-10-CM | POA: Diagnosis not present

## 2018-03-26 DIAGNOSIS — M6281 Muscle weakness (generalized): Secondary | ICD-10-CM | POA: Diagnosis not present

## 2018-03-26 DIAGNOSIS — F329 Major depressive disorder, single episode, unspecified: Secondary | ICD-10-CM | POA: Diagnosis not present

## 2018-03-26 DIAGNOSIS — T85510A Breakdown (mechanical) of bile duct prosthesis, initial encounter: Secondary | ICD-10-CM | POA: Diagnosis not present

## 2018-03-26 DIAGNOSIS — Z6841 Body Mass Index (BMI) 40.0 and over, adult: Secondary | ICD-10-CM | POA: Diagnosis not present

## 2018-03-26 HISTORY — PX: IR RADIOLOGIST EVAL & MGMT: IMG5224

## 2018-03-26 NOTE — Progress Notes (Signed)
Chief Complaint: Patient was seen in consultation today for No chief complaint on file.  at the request of Dawn Gonzales  Referring Physician(s): Dr. Daphine Gonzales  Supervising Physician: Dawn Gonzales  History of Present Illness: Dawn Gonzales is a 77 y.o. female with a past medical history significant for HLD, severe aortic stenosis and depression who presented to [facility] ED on 10/9 with complaints of nausea and epigastric pain starting that morning. RUQ US showed cholelithiasis with moderate gallbladder wall thickening, dilated common bile duct and positive Murphy's sign. She was admitted for further evaluation and management. Consults were placed to GI and surgery - GI proceeded with ERCP on 10/11 where choledocholithiasis was found and removed and biliary sphincterectomy performed Consult was placed to IR for percutaneous cholecystostomy which was successfully performed on 10/12 by Dr. Lowella Gonzales. She was discharged to home on 10/15 in stable condition. She presents today to IR clinic for routine drain injection.  Dawn Gonzales is here with her daughter today - she denies any complaints aside from fatigue which is chronic for her. She was seen by cardiology on 11/11 to discuss possible TAVR in the future which appears to be tentatively planned for next year pending further workup and cholecystectomy. She is due to the surgical team in early December to discuss cholecystectomy. She states that she has been flushing the drain daily with NS and has been having very minimal output for several weeks - she presents today with a gravity bag with scant output that was last changed on Friday. She denies noting any leakage of fluids with flushing, discharge from drain site or pain at insertion site.   Past Medical History:  Diagnosis Date  . Aortic stenosis   . Depression   . History of cellulitis   . History of endocarditis   . Hyperlipemia   . Neuropathy     Past Surgical History:    Procedure Laterality Date  . Abdominal gangrene    . ADENOIDECTOMY    . Cataract surgery    . COLONOSCOPY    . ENDOSCOPIC RETROGRADE CHOLANGIOPANCREATOGRAPHY (ERCP) WITH PROPOFOL N/A 02/08/2018   Procedure: ENDOSCOPIC RETROGRADE CHOLANGIOPANCREATOGRAPHY (ERCP) WITH PROPOFOL;  Surgeon: Meridee Score Netty Starring., MD;  Location: [facility] ENDOSCOPY;  Service: Gastroenterology;  Laterality: N/A;  . EUS  02/08/2018   Procedure: UPPER ENDOSCOPIC ULTRASOUND (EUS) LINEAR;  Surgeon: Dawn Gonzales., MD;  Location: [facility] ENDOSCOPY;  Service: Gastroenterology;;  . IR PERC CHOLECYSTOSTOMY  02/09/2018  . IR RADIOLOGIST EVAL & MGMT  03/26/2018  . REMOVAL OF STONES  02/08/2018   Procedure: REMOVAL OF STONES;  Surgeon: Meridee Score Netty Starring., MD;  Location: [facility] ENDOSCOPY;  Service: Gastroenterology;;  . Dawn Gonzales  02/08/2018   Procedure: Dawn Gonzales;  Surgeon: Dawn Gonzales, Netty Starring., MD;  Location: [facility] ENDOSCOPY;  Service: Gastroenterology;;  . TONSILLECTOMY    . Tummy tuck      Allergies: Patient has no known allergies.  Medications: Prior to Admission medications   Medication Sig Start Date End Date Taking? Authorizing Provider  acetaminophen (TYLENOL) 325 MG tablet Take 2 tablets (650 mg total) by mouth every 6 (six) hours as needed for mild pain (or Fever >/= 101). 02/12/18   Arrien, Dawn Ram, MD  Ascorbic Acid (VITAMIN C) 100 MG tablet Take 100 mg by mouth daily.    [provider]  aspirin EC 81 MG tablet Take 81 mg by mouth daily.    [provider]  Calcium Carbonate (CALCIUM-CARB 600 PO) Take 600 mg by mouth daily.  [provider]  cholecalciferol (VITAMIN D) 1000 units tablet Take 1,000 Units by mouth daily.    [provider]  ferrous sulfate 325 (65 FE) MG tablet Take 325 mg by mouth daily with breakfast.    [provider]  gabapentin (NEURONTIN) 300 MG capsule Take 300 mg by mouth 3 (three) times daily.    [provider]  Multiple Vitamin (MULTIVITAMIN WITH MINERALS) TABS tablet Take 1 tablet by mouth daily.    [provider]  nystatin ointment (MYCOSTATIN) Apply 1 application topically 2 (two) times daily as needed. 01/26/18   [provider]  pravastatin (PRAVACHOL) 20 MG tablet Take 20 mg by mouth daily.    [provider]  venlafaxine (EFFEXOR) 37.5 MG tablet Take 37.5 mg by mouth daily.    [provider]     Family History  Problem Relation Age of Onset  . Cancer Mother   . Hypertension Father   . Arthritis Father   . Heart attack Father     Social History   Socioeconomic History  . Marital status: Married    Spouse name: Not on file  . Number of children: 3  . Years of education: Not on file  . Highest education level: Not on file  Occupational History  . Occupation: Retired Civil engineer, contracting  . Financial resource strain: Not on file  . Food insecurity:    Worry: Not on file    Inability: Not on file  . Transportation needs:    Medical: Not on file    Non-medical: Not on file  Tobacco Use  . Smoking status: Former Smoker    Types: Cigarettes    Last attempt to quit: 05/23/1978    Years since quitting: 39.8  . Smokeless tobacco: Never Used  . Tobacco comment: only 1 pack pewr week   Substance and Sexual Activity  . Alcohol use: Yes    Comment: Occasional  . Drug use: Never  . Sexual activity: Not on file  Lifestyle  . Physical activity:    Days per week: Not on file    Minutes per session: Not on file  . Stress: Not on file  Relationships  . Social connections:    Talks on phone: Not on file    Gets together: Not on file    Attends religious service: Not on file    Active member of club or organization: Not on file    Attends meetings of clubs or organizations: Not on file    Relationship status: Not on file  Other Topics Concern  . Not on file  Social History Narrative  . Not on file     Review of Systems: A 12 point  ROS discussed and pertinent positives are indicated in the HPI above.  All other systems are negative.  Review of Systems  Constitutional: Positive for fatigue. Negative for appetite change, chills and fever.  Respiratory: Negative for shortness of breath.   Cardiovascular: Negative for chest pain.  Gastrointestinal: Negative for abdominal distention, abdominal pain, diarrhea, nausea and vomiting.  Skin: Negative for rash and wound.    Vital Signs: BP (!) 147/96   Pulse 75   Temp 98.2 F (36.8 C)   SpO2 96%   Physical Exam  Constitutional: No distress.  HENT:  Head: Normocephalic.  Cardiovascular: Normal rate.  Pulmonary/Chest: Effort normal.  Abdominal: Soft. She exhibits no distension. There is no tenderness.  Cholecystostomy to gravity bag with scant clear, brown liquid.  Insertion site clean, dry, intact without erythema, edema or discharge. Drain injection performed which shows leakage of contrast from drain insertion site.   Neurological: She is alert.  Skin: Skin is warm and dry. She is not diaphoretic.  Psychiatric: She has a normal mood and affect. Her behavior is normal.  Vitals reviewed.  Imaging: Ir Radiologist Eval & Mgmt  Result Date: 03/26/2018 Please refer to notes tab for details about interventional procedure. (Op Note)   Labs:  CBC: Recent Labs    02/09/18 0111 02/10/18 0932 02/11/18 0146 02/12/18 0635  WBC 17.7* 11.3* 8.5 6.2  HGB 10.7* 10.1* 10.2* 10.5*  HCT 35.2* 33.8* 32.8* 34.4*  PLT 249 221 229 226    COAGS: Recent Labs    02/07/18 1705  INR 1.07    BMP: Recent Labs    02/08/18 0222 02/09/18 0111 02/10/18 0932 02/11/18 0146  NA 140 137 136 138  K 3.9 3.8 3.6 3.9  CL 102 101 100 101  CO2 31 29 29 30   GLUCOSE 103* 135* 103* 106*  BUN 8 14 15 14   CALCIUM 9.0 8.3* 8.5* 8.7*  CREATININE 0.68 1.13* 0.86 0.80  GFRNONAA >60 46* >60 >60  GFRAA >60 53* >60 >60    LIVER FUNCTION TESTS: Recent Labs    02/06/18 1251  02/07/18 0513 02/08/18 0222 02/10/18 0932  BILITOT 0.6 0.6 0.5 0.7  AST 17 15 18 16   ALT 12 11 11 17   ALKPHOS 69 62 63 79  PROT 6.8 6.3* 5.9* 6.1*  ALBUMIN 3.6 3.3* 3.0* 2.6*    TUMOR MARKERS: No results for input(s): AFPTM, CEA, CA199, CHROMGRNA in the last 8760 hours.  Assessment:  Patient s/p cholecystectomy on 10/12 with Dr. Lowella DandyHenn which is actually her second cholecystectomy in a little over a year, during the time of [facility] she was not deemed a surgical candidate due to her cardiac issues and thus the cholecystostomy was placed in the interim. Since her discharge from the hospital she has been cleared for surgery by cardiology and has also discussed TAVR which is tentatively planned for next year pending further work up and cholecystectomy. She has an appointment with general surgery in December to discuss cholecystectomy.  Drain injection today shows leakage of contrast from drain insertion site with no contrast entering gallbladder fossa. Images were reviewed with Dr. Loreta AveWagner and plan has been made to attempt exchange of cholecystostomy tomorrow afternoon at [facility]. Patient and daughter are aware of plan and are in agreement. Scheduler for [facility] will call with appointment time.    Electronically Signed: Villa HerbShannon A Watterson PA-C 03/26/2018, 1:55 PM   Please refer to Dr. Kenna GilbertWagner's attestation of this note for management and plan.

## 2018-03-26 NOTE — Telephone Encounter (Signed)
Patient's daughter called with appointment information (re-positioning drain):    Wednesday, March 27, 2018 at 3 pm at [facility].  Arrive 2:45 pm to register.  No restrictions per HartwickBrandy.    Hector Taft OsgoodGales, RN 03/26/2018 2:08 PM

## 2018-03-27 ENCOUNTER — Ambulatory Visit (HOSPITAL_COMMUNITY)
Admission: RE | Admit: 2018-03-27 | Discharge: 2018-03-27 | Disposition: A | Discharge status: Home / Self Care | Payer: Medicare PPO | Source: Ambulatory Visit | Attending: Interventional Radiology | Admitting: Interventional Radiology

## 2018-03-27 ENCOUNTER — Encounter (HOSPITAL_COMMUNITY): Payer: Self-pay | Admitting: Diagnostic Radiology

## 2018-03-27 ENCOUNTER — Other Ambulatory Visit (HOSPITAL_COMMUNITY): Payer: Self-pay | Admitting: Interventional Radiology

## 2018-03-27 DIAGNOSIS — T85520A Displacement of bile duct prosthesis, initial encounter: Secondary | ICD-10-CM | POA: Diagnosis not present

## 2018-03-27 DIAGNOSIS — Y828 Other medical devices associated with adverse incidents: Secondary | ICD-10-CM | POA: Insufficient documentation

## 2018-03-27 DIAGNOSIS — T85510A Breakdown (mechanical) of bile duct prosthesis, initial encounter: Secondary | ICD-10-CM | POA: Insufficient documentation

## 2018-03-27 DIAGNOSIS — Z4803 Encounter for change or removal of drains: Secondary | ICD-10-CM

## 2018-03-27 HISTORY — PX: IR FLUORO RM 30-60 MIN: IMG2384

## 2018-03-27 MED ORDER — LIDOCAINE HCL 1 % IJ SOLN
INTRAMUSCULAR | Status: AC
  Filled 2018-03-27: qty 20

## 2018-03-27 MED ORDER — LIDOCAINE HCL 1 % IJ SOLN
INTRAMUSCULAR | Status: DC | PRN
  Administered 2018-03-27: 5 mL

## 2018-03-27 MED ORDER — IOPAMIDOL (ISOVUE-300) INJECTION 61%
INTRAVENOUS | Status: AC
  Administered 2018-03-27: 15 mL
  Filled 2018-03-27: qty 50

## 2018-03-27 NOTE — Procedures (Signed)
Cholecystostomy tube has come out of gallbladder.  Unable to replace tube back into gallbladder.  Gallbladder is stone filled but decompressed on US and no Murphy sign.  No plans to replace tube at this time and scheduled to follow up with surgery.

## 2018-03-29 DIAGNOSIS — K8043 Calculus of bile duct with acute cholecystitis with obstruction: Secondary | ICD-10-CM | POA: Diagnosis not present

## 2018-03-29 DIAGNOSIS — M6281 Muscle weakness (generalized): Secondary | ICD-10-CM | POA: Diagnosis not present

## 2018-03-29 DIAGNOSIS — L89152 Pressure ulcer of sacral region, stage 2: Secondary | ICD-10-CM | POA: Diagnosis not present

## 2018-03-29 DIAGNOSIS — I350 Nonrheumatic aortic (valve) stenosis: Secondary | ICD-10-CM | POA: Diagnosis not present

## 2018-03-29 DIAGNOSIS — F329 Major depressive disorder, single episode, unspecified: Secondary | ICD-10-CM | POA: Diagnosis not present

## 2018-03-29 DIAGNOSIS — Z6841 Body Mass Index (BMI) 40.0 and over, adult: Secondary | ICD-10-CM | POA: Diagnosis not present

## 2018-03-29 DIAGNOSIS — G629 Polyneuropathy, unspecified: Secondary | ICD-10-CM | POA: Diagnosis not present

## 2018-03-29 DIAGNOSIS — Z4803 Encounter for change or removal of drains: Secondary | ICD-10-CM | POA: Diagnosis not present

## 2018-04-01 DIAGNOSIS — M6281 Muscle weakness (generalized): Secondary | ICD-10-CM | POA: Diagnosis not present

## 2018-04-01 DIAGNOSIS — F329 Major depressive disorder, single episode, unspecified: Secondary | ICD-10-CM | POA: Diagnosis not present

## 2018-04-01 DIAGNOSIS — L89152 Pressure ulcer of sacral region, stage 2: Secondary | ICD-10-CM | POA: Diagnosis not present

## 2018-04-01 DIAGNOSIS — G629 Polyneuropathy, unspecified: Secondary | ICD-10-CM | POA: Diagnosis not present

## 2018-04-01 DIAGNOSIS — Z6841 Body Mass Index (BMI) 40.0 and over, adult: Secondary | ICD-10-CM | POA: Diagnosis not present

## 2018-04-01 DIAGNOSIS — I350 Nonrheumatic aortic (valve) stenosis: Secondary | ICD-10-CM | POA: Diagnosis not present

## 2018-04-01 DIAGNOSIS — Z4803 Encounter for change or removal of drains: Secondary | ICD-10-CM | POA: Diagnosis not present

## 2018-04-01 DIAGNOSIS — K8043 Calculus of bile duct with acute cholecystitis with obstruction: Secondary | ICD-10-CM | POA: Diagnosis not present

## 2018-04-02 DIAGNOSIS — F329 Major depressive disorder, single episode, unspecified: Secondary | ICD-10-CM | POA: Diagnosis not present

## 2018-04-02 DIAGNOSIS — Z4803 Encounter for change or removal of drains: Secondary | ICD-10-CM | POA: Diagnosis not present

## 2018-04-02 DIAGNOSIS — G629 Polyneuropathy, unspecified: Secondary | ICD-10-CM | POA: Diagnosis not present

## 2018-04-02 DIAGNOSIS — M6281 Muscle weakness (generalized): Secondary | ICD-10-CM | POA: Diagnosis not present

## 2018-04-02 DIAGNOSIS — L89152 Pressure ulcer of sacral region, stage 2: Secondary | ICD-10-CM | POA: Diagnosis not present

## 2018-04-02 DIAGNOSIS — Z6841 Body Mass Index (BMI) 40.0 and over, adult: Secondary | ICD-10-CM | POA: Diagnosis not present

## 2018-04-02 DIAGNOSIS — I350 Nonrheumatic aortic (valve) stenosis: Secondary | ICD-10-CM | POA: Diagnosis not present

## 2018-04-02 DIAGNOSIS — K8043 Calculus of bile duct with acute cholecystitis with obstruction: Secondary | ICD-10-CM | POA: Diagnosis not present

## 2018-04-03 DIAGNOSIS — M6281 Muscle weakness (generalized): Secondary | ICD-10-CM | POA: Diagnosis not present

## 2018-04-04 DIAGNOSIS — Z4803 Encounter for change or removal of drains: Secondary | ICD-10-CM | POA: Diagnosis not present

## 2018-04-04 DIAGNOSIS — Z6841 Body Mass Index (BMI) 40.0 and over, adult: Secondary | ICD-10-CM | POA: Diagnosis not present

## 2018-04-04 DIAGNOSIS — F329 Major depressive disorder, single episode, unspecified: Secondary | ICD-10-CM | POA: Diagnosis not present

## 2018-04-04 DIAGNOSIS — I350 Nonrheumatic aortic (valve) stenosis: Secondary | ICD-10-CM | POA: Diagnosis not present

## 2018-04-04 DIAGNOSIS — M6281 Muscle weakness (generalized): Secondary | ICD-10-CM | POA: Diagnosis not present

## 2018-04-04 DIAGNOSIS — L89152 Pressure ulcer of sacral region, stage 2: Secondary | ICD-10-CM | POA: Diagnosis not present

## 2018-04-04 DIAGNOSIS — K8043 Calculus of bile duct with acute cholecystitis with obstruction: Secondary | ICD-10-CM | POA: Diagnosis not present

## 2018-04-04 DIAGNOSIS — G629 Polyneuropathy, unspecified: Secondary | ICD-10-CM | POA: Diagnosis not present

## 2018-04-05 DIAGNOSIS — F329 Major depressive disorder, single episode, unspecified: Secondary | ICD-10-CM | POA: Diagnosis not present

## 2018-04-05 DIAGNOSIS — Z6841 Body Mass Index (BMI) 40.0 and over, adult: Secondary | ICD-10-CM | POA: Diagnosis not present

## 2018-04-05 DIAGNOSIS — Z4803 Encounter for change or removal of drains: Secondary | ICD-10-CM | POA: Diagnosis not present

## 2018-04-05 DIAGNOSIS — L89152 Pressure ulcer of sacral region, stage 2: Secondary | ICD-10-CM | POA: Diagnosis not present

## 2018-04-05 DIAGNOSIS — I350 Nonrheumatic aortic (valve) stenosis: Secondary | ICD-10-CM | POA: Diagnosis not present

## 2018-04-05 DIAGNOSIS — M6281 Muscle weakness (generalized): Secondary | ICD-10-CM | POA: Diagnosis not present

## 2018-04-05 DIAGNOSIS — G629 Polyneuropathy, unspecified: Secondary | ICD-10-CM | POA: Diagnosis not present

## 2018-04-05 DIAGNOSIS — K8043 Calculus of bile duct with acute cholecystitis with obstruction: Secondary | ICD-10-CM | POA: Diagnosis not present

## 2018-04-09 DIAGNOSIS — F329 Major depressive disorder, single episode, unspecified: Secondary | ICD-10-CM | POA: Diagnosis not present

## 2018-04-09 DIAGNOSIS — M6281 Muscle weakness (generalized): Secondary | ICD-10-CM | POA: Diagnosis not present

## 2018-04-09 DIAGNOSIS — Z6841 Body Mass Index (BMI) 40.0 and over, adult: Secondary | ICD-10-CM | POA: Diagnosis not present

## 2018-04-09 DIAGNOSIS — G629 Polyneuropathy, unspecified: Secondary | ICD-10-CM | POA: Diagnosis not present

## 2018-04-09 DIAGNOSIS — I350 Nonrheumatic aortic (valve) stenosis: Secondary | ICD-10-CM | POA: Diagnosis not present

## 2018-04-09 DIAGNOSIS — Z4803 Encounter for change or removal of drains: Secondary | ICD-10-CM | POA: Diagnosis not present

## 2018-04-09 DIAGNOSIS — K8043 Calculus of bile duct with acute cholecystitis with obstruction: Secondary | ICD-10-CM | POA: Diagnosis not present

## 2018-04-09 DIAGNOSIS — L89152 Pressure ulcer of sacral region, stage 2: Secondary | ICD-10-CM | POA: Diagnosis not present

## 2018-04-10 DIAGNOSIS — K8020 Calculus of gallbladder without cholecystitis without obstruction: Secondary | ICD-10-CM | POA: Diagnosis not present

## 2018-04-11 ENCOUNTER — Other Ambulatory Visit: Payer: Self-pay

## 2018-04-11 DIAGNOSIS — K8043 Calculus of bile duct with acute cholecystitis with obstruction: Secondary | ICD-10-CM | POA: Diagnosis not present

## 2018-04-11 DIAGNOSIS — F329 Major depressive disorder, single episode, unspecified: Secondary | ICD-10-CM | POA: Diagnosis not present

## 2018-04-11 DIAGNOSIS — L89152 Pressure ulcer of sacral region, stage 2: Secondary | ICD-10-CM | POA: Diagnosis not present

## 2018-04-11 DIAGNOSIS — I350 Nonrheumatic aortic (valve) stenosis: Secondary | ICD-10-CM

## 2018-04-11 DIAGNOSIS — Z4803 Encounter for change or removal of drains: Secondary | ICD-10-CM | POA: Diagnosis not present

## 2018-04-11 DIAGNOSIS — G629 Polyneuropathy, unspecified: Secondary | ICD-10-CM | POA: Diagnosis not present

## 2018-04-11 DIAGNOSIS — Z6841 Body Mass Index (BMI) 40.0 and over, adult: Secondary | ICD-10-CM | POA: Diagnosis not present

## 2018-04-11 DIAGNOSIS — M6281 Muscle weakness (generalized): Secondary | ICD-10-CM | POA: Diagnosis not present

## 2018-04-13 DIAGNOSIS — E782 Mixed hyperlipidemia: Secondary | ICD-10-CM | POA: Diagnosis not present

## 2018-04-13 DIAGNOSIS — I350 Nonrheumatic aortic (valve) stenosis: Secondary | ICD-10-CM | POA: Diagnosis not present

## 2018-04-13 DIAGNOSIS — F331 Major depressive disorder, recurrent, moderate: Secondary | ICD-10-CM | POA: Diagnosis not present

## 2018-04-13 DIAGNOSIS — Z6841 Body Mass Index (BMI) 40.0 and over, adult: Secondary | ICD-10-CM | POA: Diagnosis not present

## 2018-04-13 DIAGNOSIS — G9009 Other idiopathic peripheral autonomic neuropathy: Secondary | ICD-10-CM | POA: Diagnosis not present

## 2018-04-13 DIAGNOSIS — Z7409 Other reduced mobility: Secondary | ICD-10-CM | POA: Diagnosis not present

## 2018-04-13 DIAGNOSIS — G4730 Sleep apnea, unspecified: Secondary | ICD-10-CM | POA: Diagnosis not present

## 2018-04-13 DIAGNOSIS — K8000 Calculus of gallbladder with acute cholecystitis without obstruction: Secondary | ICD-10-CM | POA: Diagnosis not present

## 2018-04-15 ENCOUNTER — Encounter (HOSPITAL_COMMUNITY): Payer: Self-pay | Admitting: Dentistry

## 2018-04-15 ENCOUNTER — Ambulatory Visit (HOSPITAL_COMMUNITY): Payer: Self-pay | Admitting: Dentistry

## 2018-04-15 VITALS — BP 149/74 | HR 56 | Temp 97.9°F

## 2018-04-15 DIAGNOSIS — K045 Chronic apical periodontitis: Secondary | ICD-10-CM

## 2018-04-15 DIAGNOSIS — I350 Nonrheumatic aortic (valve) stenosis: Secondary | ICD-10-CM | POA: Diagnosis not present

## 2018-04-15 DIAGNOSIS — K0889 Other specified disorders of teeth and supporting structures: Secondary | ICD-10-CM

## 2018-04-15 DIAGNOSIS — M264 Malocclusion, unspecified: Secondary | ICD-10-CM

## 2018-04-15 DIAGNOSIS — Z8679 Personal history of other diseases of the circulatory system: Secondary | ICD-10-CM | POA: Diagnosis not present

## 2018-04-15 DIAGNOSIS — Z01818 Encounter for other preprocedural examination: Secondary | ICD-10-CM | POA: Diagnosis not present

## 2018-04-15 DIAGNOSIS — K029 Dental caries, unspecified: Secondary | ICD-10-CM

## 2018-04-15 DIAGNOSIS — K083 Retained dental root: Secondary | ICD-10-CM

## 2018-04-15 DIAGNOSIS — K08409 Partial loss of teeth, unspecified cause, unspecified class: Secondary | ICD-10-CM

## 2018-04-15 DIAGNOSIS — K0530 Chronic periodontitis, unspecified: Secondary | ICD-10-CM

## 2018-04-15 DIAGNOSIS — K0401 Reversible pulpitis: Secondary | ICD-10-CM

## 2018-04-15 DIAGNOSIS — K011 Impacted teeth: Secondary | ICD-10-CM

## 2018-04-15 DIAGNOSIS — K036 Deposits [accretions] on teeth: Secondary | ICD-10-CM

## 2018-04-15 DIAGNOSIS — K06010 Localized gingival recession, unspecified: Secondary | ICD-10-CM

## 2018-04-15 NOTE — Patient Instructions (Signed)
[facility]    Department of Dental Medicine     DR. Raven Furnas      HEART VALVES AND MOUTH CARE:  FACTS:   If you have any infection in your mouth, it can infect your heart valve.  If you heart valve is infected, you will be seriously ill.  Infections in the mouth can be SILENT and do not always cause pain.  Examples of infections in the mouth are gum disease, dental cavities, and abscesses.  Some possible signs of infection are: Bad breath, bleeding gums, or teeth that are sensitive to sweets, hot, and/or cold. There are many other signs as well.  WHAT YOU HAVE TO DO:   Brush your teeth after meals and at bedtime. Spend at least 2 minutes brushing well, especially behind your back teeth and all around your teeth that stand alone. Brush at the gumline also.  Do not go to bed without brushing your teeth and flossing.  If you gums bleed when you brush or floss, do NOT stop brushing or flossing. It usually means that your gums need more attention and better cleaning.   If your Dentist or Dr. Cullin Dishman gave you a prescription mouthwash to use, make sure to use it as directed. If you run out of the medication, get a refill at the pharmacy.   If you were given any other medications or directions by your Dentist, please follow them. If you did not understand the directions or forget what you were told, please call. We will be happy to refresh her memory.  If you need antibiotics before dental procedures, make sure you take them one hour prior to every dental visit as directed.   Get a dental checkup every 4-6 months in order to keep your mouth healthy, or to find and treat any new infection. You will most likely need your teeth cleaned or gums treated at the same time.  If you are not able to come in for your scheduled appointment, call your Dentist as soon as possible to reschedule.  If you have a problem in between dental visits, call your Dentist.  

## 2018-04-15 NOTE — Progress Notes (Signed)
DENTAL CONSULTATION  Date of Consultation:  04/15/2018 Patient Name:   Dawn Gonzales Date of Birth:   27-Sep-1940 Medical Record Number: [id number]  VITALS: BP (!) 149/74 (BP Location: Left Wrist)   Pulse (!) 56   Temp 97.9 F (36.6 C)   CHIEF COMPLAINT: Patient referred by Dr. Clifton James for dental consultation.  HPI: Dawn Gonzales Is a 77 year old female with history of endocarditis and severe aortic stenosis. Patient with anticipated TAVR procedure with Dr. Clifton James.  Patient is now seen as part of a pre-heart valve surgery dental protocol examination to rule out dental infection that may affect the patient's systemic health and anticipated heart valve surgery.  The patient has a history of acute pulpitis symptoms involving the upper right quadrant premolar. This is a dull, achy pain that reaches intensity of 5 out of 10 but is currently 0 out of 10. The pain primarily occurs when she is chewing and lasts for minutes to hours only. This has been occurring over the past 2 months.  The patient was last seen by dentist in [place] when she was diagnosed with bacterial endocarditis in February of 2019.  The patient did not follow-up with proposed full mouth extractions at that time.  The patient subsequently moved from [place], [place] to live near her daughter in [place], [place]. The patient has not seen a dentist in [place], [place]. The patient denies having partial dentures. Patient denies having dental phobia.   PROBLEM LIST: Patient Active Problem List   Diagnosis Date Noted  . Severe aortic stenosis     Priority: High  . Calculus of bile duct with acute cholecystitis and obstruction   . Obesity, Class III, BMI 40-49.9 (morbid obesity) (HCC)   . Junctional bradycardia   . Cholecystitis, acute with cholelithiasis 02/07/2018  . Calculus of gallbladder with biliary obstruction but without cholecystitis   . Preoperative cardiovascular  examination   . Acute cholecystitis   . Choledocholithiasis   . N&V (nausea and vomiting) 02/06/2018  . Epigastric pain 02/06/2018    PMH: Past Medical History:  Diagnosis Date  . Aortic stenosis   . Depression   . History of cellulitis   . History of endocarditis   . Hyperlipemia   . Neuropathy     PSH: Past Surgical History:  Procedure Laterality Date  . Abdominal gangrene    . ADENOIDECTOMY    . Cataract surgery    . COLONOSCOPY    . ENDOSCOPIC RETROGRADE CHOLANGIOPANCREATOGRAPHY (ERCP) WITH PROPOFOL N/A 02/08/2018   Procedure: ENDOSCOPIC RETROGRADE CHOLANGIOPANCREATOGRAPHY (ERCP) WITH PROPOFOL;  Surgeon: Meridee Score Netty Starring., MD;  Location: [facility] ENDOSCOPY;  Service: Gastroenterology;  Laterality: N/A;  . EUS  02/08/2018   Procedure: UPPER ENDOSCOPIC ULTRASOUND (EUS) LINEAR;  Surgeon: Lemar Lofty., MD;  Location: [facility] ENDOSCOPY;  Service: Gastroenterology;;  . IR FLUORO RM 30-60 MIN  03/27/2018  . IR PERC CHOLECYSTOSTOMY  02/09/2018  . IR RADIOLOGIST EVAL & MGMT  03/26/2018  . REMOVAL OF STONES  02/08/2018   Procedure: REMOVAL OF STONES;  Surgeon: Meridee Score Netty Starring., MD;  Location: [facility] ENDOSCOPY;  Service: Gastroenterology;;  . Dennison Mascot  02/08/2018   Procedure: Dennison Mascot;  Surgeon: Mansouraty, Netty Starring., MD;  Location: [facility] ENDOSCOPY;  Service: Gastroenterology;;  . TONSILLECTOMY    . Tummy tuck      ALLERGIES: No Known Allergies  MEDICATIONS: Current Outpatient Medications  Medication Sig Dispense Refill  . acetaminophen (TYLENOL) 325 MG tablet Take 2 tablets (650 mg total) by mouth every 6 (  six) hours as needed for mild pain (or Fever >/= 101). 20 tablet 0  . Ascorbic Acid (VITAMIN C) 100 MG tablet Take 100 mg by mouth daily.    Marland Kitchen aspirin EC 81 MG tablet Take 81 mg by mouth daily.    . Calcium Carbonate (CALCIUM-CARB 600 PO) Take 600 mg by mouth daily.    . cholecalciferol (VITAMIN D) 1000 units tablet Take 1,000 Units by mouth daily.     . ferrous sulfate 325 (65 FE) MG tablet Take 325 mg by mouth daily with breakfast.    . gabapentin (NEURONTIN) 300 MG capsule Take 300 mg by mouth 3 (three) times daily.    . Multiple Vitamin (MULTIVITAMIN WITH MINERALS) TABS tablet Take 1 tablet by mouth daily.    Marland Kitchen nystatin ointment (MYCOSTATIN) Apply 1 application topically 2 (two) times daily as needed.  2  . pravastatin (PRAVACHOL) 20 MG tablet Take 20 mg by mouth daily.    Marland Kitchen venlafaxine (EFFEXOR) 37.5 MG tablet Take 37.5 mg by mouth daily.     No current facility-administered medications for this visit.     LABS: Lab Results  Component Value Date   WBC 6.2 02/12/2018   HGB 10.5 (L) 02/12/2018   HCT 34.4 (L) 02/12/2018   MCV 96.6 02/12/2018   PLT 226 02/12/2018      Component Value Date/Time   NA 138 02/11/2018 0146   K 3.9 02/11/2018 0146   CL 101 02/11/2018 0146   CO2 30 02/11/2018 0146   GLUCOSE 106 (H) 02/11/2018 0146   BUN 14 02/11/2018 0146   CREATININE 0.80 02/11/2018 0146   CALCIUM 8.7 (L) 02/11/2018 0146   GFRNONAA >60 02/11/2018 0146   GFRAA >60 02/11/2018 0146   Lab Results  Component Value Date   INR 1.07 02/07/2018   No results found for: PTT  SOCIAL HISTORY: Social History   Socioeconomic History  . Marital status: Married    Spouse name: Not on file  . Number of children: 3  . Years of education: Not on file  . Highest education level: Not on file  Occupational History  . Occupation: Retired Civil engineer, contracting  . Financial resource strain: Not on file  . Food insecurity:    Worry: Not on file    Inability: Not on file  . Transportation needs:    Medical: Not on file    Non-medical: Not on file  Tobacco Use  . Smoking status: Former Smoker    Types: Cigarettes    Last attempt to quit: 05/23/1978    Years since quitting: 39.9  . Smokeless tobacco: Never Used  . Tobacco comment: only 1 pack pewr week   Substance and Sexual Activity  . Alcohol use: Yes    Comment: Occasional  .  Drug use: Never  . Sexual activity: Not on file  Lifestyle  . Physical activity:    Days per week: Not on file    Minutes per session: Not on file  . Stress: Not on file  Relationships  . Social connections:    Talks on phone: Not on file    Gets together: Not on file    Attends religious service: Not on file    Active member of club or organization: Not on file    Attends meetings of clubs or organizations: Not on file    Relationship status: Not on file  . Intimate partner violence:    Fear of current or ex partner: Not on file  Emotionally abused: Not on file    Physically abused: Not on file    Forced sexual activity: Not on file  Other Topics Concern  . Not on file  Social History Narrative  . Not on file    FAMILY HISTORY: Family History  Problem Relation Age of Onset  . Cancer Mother   . Hypertension Father   . Arthritis Father   . Heart attack Father     REVIEW OF SYSTEMS: Reviewed with the patient as per History of present illness. Psych: Patient denies having dental phobia.  DENTAL HISTORY: CHIEF COMPLAINT: Patient referred by Dr. Clifton James for dental consultation.  HPI: Dawn Gonzales Is a 77 year old female with history of endocarditis and severe aortic stenosis. Patient with anticipated TAVR procedure with Dr. Clifton James.  Patient is now seen as part of a pre-heart valve surgery dental protocol examination to rule out dental infection that may affect the patient's systemic health and anticipated heart valve surgery.  The patient has a history of acute pulpitis symptoms involving the upper right quadrant premolar. This is a dull, achy pain that reaches intensity of 5 out of 10 but is currently 0 out of 10. The pain primarily occurs when she is chewing and lasts for minutes to hours only. This has been occurring over the past 2 months.  The patient was last seen by dentist in [place] when she was diagnosed with bacterial endocarditis in February of 2019.   The patient did not follow-up with proposed full mouth extractions at that time.  The patient subsequently moved from [place], [place] to live near her daughter in [place], [place]. The patient has not seen a dentist in [place], [place]. The patient denies having partial dentures. Patient denies having dental phobia.   DENTAL EXAMINATION: GENERAL:  The patient is a well-developed, obese female in no acute distress.  The patient presents in a wheelchair. The patient was able to be transferred from the wheelchair to the dental chair. HEAD AND NECK:  There is no palpable neck lymphadenopathy. The patient denies acute TMJ symptoms. INTRAORAL EXAM: The patient has normal saliva.  There is no evidence of oral abscess formation. DENTITION: The patient is missing tooth numbers 4, 7 9, 14, 16, 18, 19, 22, 23, 24, 25, 26, 30, and 31. There are retained root segments in the area of tooth numbers 2, 3, 6, 11, 12, 13, and 15. Tooth #1 is impacted.   PERIODONTAL:  The patient has chronic periodontitis with plaque and calculus accumulations, gingival recession, and moderate to severe bone loss. Tooth mobility is noted as per dental charting form.  Tooth #5 has class II+ mobility. DENTAL CARIES/SUBOPTIMAL RESTORATIONS:  Multiple dental caries are noted. ENDODONTIC: The patient has a history of acute pulpitis symptoms. The patient has multiple areas of periapical pathology and radiolucency. CROWN AND BRIDGE:  There are no crown or bridge restorations noted. PROSTHODONTIC: The patient denies having partial dentures. OCCLUSION:  The patient has a poor occlusal scheme secondary to multiple missing teeth, multiple retained root segments, supra-eruption and drifting of the unopposed teeth into the edentulous areas, and lack of replacement of missing teeth with dental prostheses  RADIOGRAPHIC INTERPRETATION: An orthopantogram was taken and was supplemented with 14 periapical  radiographs. There are multiple missing teeth. There are multiple retained root segments. Tooth #1 is impacted. Multiple dental caries are noted. Multiple areas of periapical pathology and radiolucency are noted. There is supra-eruption and drifting of the unopposed teeth into the edentulous areas.  There is moderate to severe bone loss noted.  ASSESSMENTS: 1. Severe aortic stenosis 2. History of endocarditis 3. Pre-heart valve surgery dental protocol 4. History of acute pulpitis 5. Chronic apical periodontitis 6. Multiple retained root tips and root segments 7. Multiple dental caries 8. Chronic periodontitis with bone loss 9. Gingival recession 10. Accretions 11. Tooth mobility 12. Multiple missing teeth 13. Impacted tooth #1 14. Supra-eruption and drifting of the unopposed teeth into the edentulous areas 15. Poor occlusal scheme and malocclusion 16. Need for antibiotic premedication prior to invasive dental procedures due to history of endocarditis  PLAN/RECOMMENDATIONS: 1. I discussed the risks, benefits, and complications of various treatment options with the patient in relationship to her medical and dental conditions, history of endocarditis, anticipated heart valve surgery, and future risk for endocarditis. We discussed various treatment options to include no treatment, total and subtotal extractions with alveoloplasty, pre-prosthetic surgery as indicated, periodontal therapy, dental restorations, root canal therapy, crown and bridge therapy, implant therapy, and replacement of missing teeth as indicated. We also discussed referral to an oral surgeon due to the complexity of her anticipated dental extraction procedures.  The patient currently wishes to proceed with referral to an oral surgeon for extraction of remaining teeth with alveoloplasty.  The patient has been scheduled to see Dr. Ocie DoyneScott Jensen, oral surgeon, on Wednesday, 04/17/2018 at 2 PM.  The patient will then proceed with  follow-up with a general dentist of her choice for fabrication of upper and lower complete dentures after adequate healing from the extraction procedures and once medically stable from the anticipated TAVR procedure with Dr. Clifton JamesMcAlhany.  2. Discussion of findings with medical team and coordination of future medical and dental care as needed.  I spent in excess of  150 minutes during the conduct of this consultation and >50% of this time involved direct face-to-face encounter for counseling and/or coordination of the patient's care.    Dawn Gonzales, DDS

## 2018-04-16 DIAGNOSIS — I350 Nonrheumatic aortic (valve) stenosis: Secondary | ICD-10-CM | POA: Diagnosis not present

## 2018-04-16 DIAGNOSIS — Z6841 Body Mass Index (BMI) 40.0 and over, adult: Secondary | ICD-10-CM | POA: Diagnosis not present

## 2018-04-16 DIAGNOSIS — L89152 Pressure ulcer of sacral region, stage 2: Secondary | ICD-10-CM | POA: Diagnosis not present

## 2018-04-16 DIAGNOSIS — F329 Major depressive disorder, single episode, unspecified: Secondary | ICD-10-CM | POA: Diagnosis not present

## 2018-04-16 DIAGNOSIS — M6281 Muscle weakness (generalized): Secondary | ICD-10-CM | POA: Diagnosis not present

## 2018-04-16 DIAGNOSIS — Z4803 Encounter for change or removal of drains: Secondary | ICD-10-CM | POA: Diagnosis not present

## 2018-04-16 DIAGNOSIS — K8043 Calculus of bile duct with acute cholecystitis with obstruction: Secondary | ICD-10-CM | POA: Diagnosis not present

## 2018-04-16 DIAGNOSIS — G629 Polyneuropathy, unspecified: Secondary | ICD-10-CM | POA: Diagnosis not present

## 2018-04-17 DIAGNOSIS — M180 Bilateral primary osteoarthritis of first carpometacarpal joints: Secondary | ICD-10-CM | POA: Diagnosis not present

## 2018-04-17 DIAGNOSIS — M170 Bilateral primary osteoarthritis of knee: Secondary | ICD-10-CM | POA: Diagnosis not present

## 2018-04-17 DIAGNOSIS — Z993 Dependence on wheelchair: Secondary | ICD-10-CM | POA: Diagnosis not present

## 2018-04-17 DIAGNOSIS — G629 Polyneuropathy, unspecified: Secondary | ICD-10-CM | POA: Diagnosis not present

## 2018-04-17 DIAGNOSIS — F331 Major depressive disorder, recurrent, moderate: Secondary | ICD-10-CM | POA: Diagnosis not present

## 2018-04-17 DIAGNOSIS — Z7409 Other reduced mobility: Secondary | ICD-10-CM | POA: Diagnosis not present

## 2018-04-17 DIAGNOSIS — I350 Nonrheumatic aortic (valve) stenosis: Secondary | ICD-10-CM | POA: Diagnosis not present

## 2018-04-17 DIAGNOSIS — E782 Mixed hyperlipidemia: Secondary | ICD-10-CM | POA: Diagnosis not present

## 2018-04-18 DIAGNOSIS — I350 Nonrheumatic aortic (valve) stenosis: Secondary | ICD-10-CM | POA: Diagnosis not present

## 2018-04-18 DIAGNOSIS — Z4803 Encounter for change or removal of drains: Secondary | ICD-10-CM | POA: Diagnosis not present

## 2018-04-18 DIAGNOSIS — K8043 Calculus of bile duct with acute cholecystitis with obstruction: Secondary | ICD-10-CM | POA: Diagnosis not present

## 2018-04-18 DIAGNOSIS — M6281 Muscle weakness (generalized): Secondary | ICD-10-CM | POA: Diagnosis not present

## 2018-04-18 DIAGNOSIS — F329 Major depressive disorder, single episode, unspecified: Secondary | ICD-10-CM | POA: Diagnosis not present

## 2018-04-18 DIAGNOSIS — Z6841 Body Mass Index (BMI) 40.0 and over, adult: Secondary | ICD-10-CM | POA: Diagnosis not present

## 2018-04-18 DIAGNOSIS — L89152 Pressure ulcer of sacral region, stage 2: Secondary | ICD-10-CM | POA: Diagnosis not present

## 2018-04-18 DIAGNOSIS — G629 Polyneuropathy, unspecified: Secondary | ICD-10-CM | POA: Diagnosis not present

## 2018-04-19 ENCOUNTER — Telehealth: Payer: Self-pay | Admitting: *Deleted

## 2018-04-19 DIAGNOSIS — I10 Essential (primary) hypertension: Secondary | ICD-10-CM | POA: Diagnosis not present

## 2018-04-19 DIAGNOSIS — Z4803 Encounter for change or removal of drains: Secondary | ICD-10-CM | POA: Diagnosis not present

## 2018-04-19 DIAGNOSIS — Z6841 Body Mass Index (BMI) 40.0 and over, adult: Secondary | ICD-10-CM | POA: Diagnosis not present

## 2018-04-19 DIAGNOSIS — M6281 Muscle weakness (generalized): Secondary | ICD-10-CM | POA: Diagnosis not present

## 2018-04-19 DIAGNOSIS — I350 Nonrheumatic aortic (valve) stenosis: Secondary | ICD-10-CM | POA: Diagnosis not present

## 2018-04-19 DIAGNOSIS — G629 Polyneuropathy, unspecified: Secondary | ICD-10-CM | POA: Diagnosis not present

## 2018-04-19 DIAGNOSIS — K8043 Calculus of bile duct with acute cholecystitis with obstruction: Secondary | ICD-10-CM | POA: Diagnosis not present

## 2018-04-19 DIAGNOSIS — L89152 Pressure ulcer of sacral region, stage 2: Secondary | ICD-10-CM | POA: Diagnosis not present

## 2018-04-19 DIAGNOSIS — G4733 Obstructive sleep apnea (adult) (pediatric): Secondary | ICD-10-CM | POA: Diagnosis not present

## 2018-04-19 DIAGNOSIS — F329 Major depressive disorder, single episode, unspecified: Secondary | ICD-10-CM | POA: Diagnosis not present

## 2018-04-19 NOTE — Telephone Encounter (Signed)
   [facility] Pre-operative Risk Assessment    Request for surgical clearance:  1. What type of surgery is being performed? LAP CHOLE W/IOC   2. When is this surgery scheduled? TBD   3. What type of clearance is required (medical clearance vs. Pharmacy clearance to hold med vs. Both)? MEDICAL  4. Are there any medications that need to be held prior to surgery and how long?ASA   5. Practice name and name of physician performing surgery? [facility]; DR. Rodman [place]   6. What is your office phone number [phone]    7.   What is your office fax number [phone]  8.   Anesthesia type (None, local, MAC, general) ? GENERAL   Dawn Gonzales 04/19/2018, 1:35 PM  _________________________________________________________________   (provider comments below)

## 2018-04-19 NOTE — Telephone Encounter (Signed)
ADDENDUM:  After Further discussion with Dr. Clifton JamesMcAlhany today, pt will not have surgery done. He has been in communication with surgeon and decision was made not to procedure with lap chole at this time.   Will remove from preop pool.

## 2018-04-19 NOTE — Telephone Encounter (Signed)
   Primary Cardiologist: Verne Carrowhristopher McAlhany, MD  Chart reviewed as part of pre-operative protocol coverage. Pt was recently seen by Dr. Clifton JamesMcAlhany and cleared for surgery. Per note, "Given ongoing issues with her gallbladder, I think it is best to delay TAVR for now. I think she can safely proceed with her cholecystectomy".  I will route this recommendation to the requesting party via Epic fax function and remove from pre-op pool.  Please call with questions.  Robbie LisBrittainy Simmons, PA-C 04/19/2018, 2:26 PM

## 2018-04-22 DIAGNOSIS — F329 Major depressive disorder, single episode, unspecified: Secondary | ICD-10-CM | POA: Diagnosis not present

## 2018-04-22 DIAGNOSIS — Z4803 Encounter for change or removal of drains: Secondary | ICD-10-CM | POA: Diagnosis not present

## 2018-04-22 DIAGNOSIS — I350 Nonrheumatic aortic (valve) stenosis: Secondary | ICD-10-CM | POA: Diagnosis not present

## 2018-04-22 DIAGNOSIS — Z6841 Body Mass Index (BMI) 40.0 and over, adult: Secondary | ICD-10-CM | POA: Diagnosis not present

## 2018-04-22 DIAGNOSIS — M6281 Muscle weakness (generalized): Secondary | ICD-10-CM | POA: Diagnosis not present

## 2018-04-22 DIAGNOSIS — K8043 Calculus of bile duct with acute cholecystitis with obstruction: Secondary | ICD-10-CM | POA: Diagnosis not present

## 2018-04-22 DIAGNOSIS — G629 Polyneuropathy, unspecified: Secondary | ICD-10-CM | POA: Diagnosis not present

## 2018-04-22 DIAGNOSIS — L89152 Pressure ulcer of sacral region, stage 2: Secondary | ICD-10-CM | POA: Diagnosis not present

## 2018-04-23 DIAGNOSIS — Z4803 Encounter for change or removal of drains: Secondary | ICD-10-CM | POA: Diagnosis not present

## 2018-04-23 DIAGNOSIS — M6281 Muscle weakness (generalized): Secondary | ICD-10-CM | POA: Diagnosis not present

## 2018-04-23 DIAGNOSIS — G629 Polyneuropathy, unspecified: Secondary | ICD-10-CM | POA: Diagnosis not present

## 2018-04-23 DIAGNOSIS — I350 Nonrheumatic aortic (valve) stenosis: Secondary | ICD-10-CM | POA: Diagnosis not present

## 2018-04-23 DIAGNOSIS — L89152 Pressure ulcer of sacral region, stage 2: Secondary | ICD-10-CM | POA: Diagnosis not present

## 2018-04-23 DIAGNOSIS — Z6841 Body Mass Index (BMI) 40.0 and over, adult: Secondary | ICD-10-CM | POA: Diagnosis not present

## 2018-04-23 DIAGNOSIS — F329 Major depressive disorder, single episode, unspecified: Secondary | ICD-10-CM | POA: Diagnosis not present

## 2018-04-23 DIAGNOSIS — K8043 Calculus of bile duct with acute cholecystitis with obstruction: Secondary | ICD-10-CM | POA: Diagnosis not present

## 2018-04-25 ENCOUNTER — Other Ambulatory Visit (HOSPITAL_BASED_OUTPATIENT_CLINIC_OR_DEPARTMENT_OTHER): Payer: Self-pay

## 2018-04-25 DIAGNOSIS — G4733 Obstructive sleep apnea (adult) (pediatric): Secondary | ICD-10-CM

## 2018-04-25 DIAGNOSIS — Z6841 Body Mass Index (BMI) 40.0 and over, adult: Secondary | ICD-10-CM | POA: Diagnosis not present

## 2018-04-25 DIAGNOSIS — F329 Major depressive disorder, single episode, unspecified: Secondary | ICD-10-CM | POA: Diagnosis not present

## 2018-04-25 DIAGNOSIS — M6281 Muscle weakness (generalized): Secondary | ICD-10-CM | POA: Diagnosis not present

## 2018-04-25 DIAGNOSIS — Z4803 Encounter for change or removal of drains: Secondary | ICD-10-CM | POA: Diagnosis not present

## 2018-04-25 DIAGNOSIS — G629 Polyneuropathy, unspecified: Secondary | ICD-10-CM | POA: Diagnosis not present

## 2018-04-25 DIAGNOSIS — L89152 Pressure ulcer of sacral region, stage 2: Secondary | ICD-10-CM | POA: Diagnosis not present

## 2018-04-25 DIAGNOSIS — K8043 Calculus of bile duct with acute cholecystitis with obstruction: Secondary | ICD-10-CM | POA: Diagnosis not present

## 2018-04-25 DIAGNOSIS — I350 Nonrheumatic aortic (valve) stenosis: Secondary | ICD-10-CM | POA: Diagnosis not present

## 2018-04-26 DIAGNOSIS — Z4803 Encounter for change or removal of drains: Secondary | ICD-10-CM | POA: Diagnosis not present

## 2018-04-26 DIAGNOSIS — Z6841 Body Mass Index (BMI) 40.0 and over, adult: Secondary | ICD-10-CM | POA: Diagnosis not present

## 2018-04-26 DIAGNOSIS — K8043 Calculus of bile duct with acute cholecystitis with obstruction: Secondary | ICD-10-CM | POA: Diagnosis not present

## 2018-04-26 DIAGNOSIS — G629 Polyneuropathy, unspecified: Secondary | ICD-10-CM | POA: Diagnosis not present

## 2018-04-26 DIAGNOSIS — M6281 Muscle weakness (generalized): Secondary | ICD-10-CM | POA: Diagnosis not present

## 2018-04-26 DIAGNOSIS — L89152 Pressure ulcer of sacral region, stage 2: Secondary | ICD-10-CM | POA: Diagnosis not present

## 2018-04-26 DIAGNOSIS — F329 Major depressive disorder, single episode, unspecified: Secondary | ICD-10-CM | POA: Diagnosis not present

## 2018-04-26 DIAGNOSIS — I350 Nonrheumatic aortic (valve) stenosis: Secondary | ICD-10-CM | POA: Diagnosis not present

## 2018-04-30 DIAGNOSIS — Z4803 Encounter for change or removal of drains: Secondary | ICD-10-CM | POA: Diagnosis not present

## 2018-04-30 DIAGNOSIS — L89152 Pressure ulcer of sacral region, stage 2: Secondary | ICD-10-CM | POA: Diagnosis not present

## 2018-04-30 DIAGNOSIS — I350 Nonrheumatic aortic (valve) stenosis: Secondary | ICD-10-CM | POA: Diagnosis not present

## 2018-04-30 DIAGNOSIS — G629 Polyneuropathy, unspecified: Secondary | ICD-10-CM | POA: Diagnosis not present

## 2018-04-30 DIAGNOSIS — M6281 Muscle weakness (generalized): Secondary | ICD-10-CM | POA: Diagnosis not present

## 2018-04-30 DIAGNOSIS — F329 Major depressive disorder, single episode, unspecified: Secondary | ICD-10-CM | POA: Diagnosis not present

## 2018-04-30 DIAGNOSIS — Z6841 Body Mass Index (BMI) 40.0 and over, adult: Secondary | ICD-10-CM | POA: Diagnosis not present

## 2018-04-30 DIAGNOSIS — K8043 Calculus of bile duct with acute cholecystitis with obstruction: Secondary | ICD-10-CM | POA: Diagnosis not present

## 2018-05-01 DIAGNOSIS — G4730 Sleep apnea, unspecified: Secondary | ICD-10-CM | POA: Diagnosis not present

## 2018-05-01 DIAGNOSIS — G629 Polyneuropathy, unspecified: Secondary | ICD-10-CM | POA: Diagnosis not present

## 2018-05-01 DIAGNOSIS — M6281 Muscle weakness (generalized): Secondary | ICD-10-CM | POA: Diagnosis not present

## 2018-05-01 DIAGNOSIS — I350 Nonrheumatic aortic (valve) stenosis: Secondary | ICD-10-CM | POA: Diagnosis not present

## 2018-05-01 DIAGNOSIS — Z792 Long term (current) use of antibiotics: Secondary | ICD-10-CM | POA: Diagnosis not present

## 2018-05-01 DIAGNOSIS — Z6841 Body Mass Index (BMI) 40.0 and over, adult: Secondary | ICD-10-CM | POA: Diagnosis not present

## 2018-05-01 DIAGNOSIS — F329 Major depressive disorder, single episode, unspecified: Secondary | ICD-10-CM | POA: Diagnosis not present

## 2018-05-01 DIAGNOSIS — K8043 Calculus of bile duct with acute cholecystitis with obstruction: Secondary | ICD-10-CM | POA: Diagnosis not present

## 2018-05-04 DIAGNOSIS — M6281 Muscle weakness (generalized): Secondary | ICD-10-CM | POA: Diagnosis not present

## 2018-05-07 DIAGNOSIS — Z792 Long term (current) use of antibiotics: Secondary | ICD-10-CM | POA: Diagnosis not present

## 2018-05-07 DIAGNOSIS — G629 Polyneuropathy, unspecified: Secondary | ICD-10-CM | POA: Diagnosis not present

## 2018-05-07 DIAGNOSIS — F329 Major depressive disorder, single episode, unspecified: Secondary | ICD-10-CM | POA: Diagnosis not present

## 2018-05-07 DIAGNOSIS — M6281 Muscle weakness (generalized): Secondary | ICD-10-CM | POA: Diagnosis not present

## 2018-05-07 DIAGNOSIS — K8043 Calculus of bile duct with acute cholecystitis with obstruction: Secondary | ICD-10-CM | POA: Diagnosis not present

## 2018-05-07 DIAGNOSIS — I350 Nonrheumatic aortic (valve) stenosis: Secondary | ICD-10-CM | POA: Diagnosis not present

## 2018-05-07 DIAGNOSIS — G4730 Sleep apnea, unspecified: Secondary | ICD-10-CM | POA: Diagnosis not present

## 2018-05-07 DIAGNOSIS — Z6841 Body Mass Index (BMI) 40.0 and over, adult: Secondary | ICD-10-CM | POA: Diagnosis not present

## 2018-05-08 ENCOUNTER — Ambulatory Visit: Payer: Medicare PPO | Attending: Internal Medicine | Admitting: Neurology

## 2018-05-08 ENCOUNTER — Other Ambulatory Visit: Payer: Self-pay

## 2018-05-08 DIAGNOSIS — M6281 Muscle weakness (generalized): Secondary | ICD-10-CM | POA: Diagnosis not present

## 2018-05-08 DIAGNOSIS — G4733 Obstructive sleep apnea (adult) (pediatric): Secondary | ICD-10-CM | POA: Diagnosis not present

## 2018-05-08 DIAGNOSIS — I350 Nonrheumatic aortic (valve) stenosis: Secondary | ICD-10-CM

## 2018-05-08 DIAGNOSIS — Z792 Long term (current) use of antibiotics: Secondary | ICD-10-CM | POA: Diagnosis not present

## 2018-05-08 DIAGNOSIS — Z6841 Body Mass Index (BMI) 40.0 and over, adult: Secondary | ICD-10-CM | POA: Diagnosis not present

## 2018-05-08 DIAGNOSIS — K8043 Calculus of bile duct with acute cholecystitis with obstruction: Secondary | ICD-10-CM | POA: Diagnosis not present

## 2018-05-08 DIAGNOSIS — F329 Major depressive disorder, single episode, unspecified: Secondary | ICD-10-CM | POA: Diagnosis not present

## 2018-05-08 DIAGNOSIS — G629 Polyneuropathy, unspecified: Secondary | ICD-10-CM | POA: Diagnosis not present

## 2018-05-08 DIAGNOSIS — G4730 Sleep apnea, unspecified: Secondary | ICD-10-CM | POA: Diagnosis not present

## 2018-05-09 NOTE — Procedures (Signed)
    [facility] [place] Heberle A. Gerilyn Pilgrim[place]quah, MD     [email or web address]             NOCTURNAL POLYSOMNOGRAPHY   LOCATION: [facility]  Patient Name: Dawn Gonzales, [name] Study Date: 05/08/2018 Gender: Female D.O.B: 01-Sep-1940 Age (years): 77 Referring Provider: Catalina PizzaZach Hall Height (inches): 66 Interpreting Physician: Beryle BeamsKofi Mekayla Soman MD, ABSM Weight (lbs): 275 RPSGT: Peak, Robert BMI: 44 MRN: [id number] Neck Size: CLINICAL INFORMATION Sleep Study Type: NPSG     Indication for sleep study: OSA     Epworth Sleepiness Score: 4     SLEEP STUDY TECHNIQUE As per the AASM Manual for the Scoring of Sleep and Associated Events v2.3 (April 2016) with a hypopnea requiring 4% desaturations.  The channels recorded and monitored were frontal, central and occipital EEG, electrooculogram (EOG), submentalis EMG (chin), nasal and oral airflow, thoracic and abdominal wall motion, anterior tibialis EMG, snore microphone, electrocardiogram, and pulse oximetry.  MEDICATIONS Medications self-administered by patient taken the night of the study : N/A     SLEEP ARCHITECTURE The study was initiated at 9:59:08 PM and ended at 4:25:06 AM.  Sleep onset time was 12.4 minutes and the sleep efficiency was 89.6%%. The total sleep time was 346 minutes.  Stage REM latency was N/A minutes.  The patient spent 14.7%% of the night in stage N1 sleep, 85.3%% in stage N2 sleep, 0.0%% in stage N3 and 0% in REM.  Alpha intrusion was absent.  Supine sleep was 25.64%.  RESPIRATORY PARAMETERS The overall apnea/hypopnea index (AHI) was 12.3 per hour. There were 2 total apneas, including 2 obstructive, 0 central and 0 mixed apneas. There were 69 hypopneas and 315 RERAs.  The AHI during Stage REM sleep was N/A per hour.  AHI while supine was 9.5 per hour.  The mean oxygen saturation was 88.6%. The minimum SpO2 during sleep was 83.0%.  There was prolonged episodes of hypoxia in the absence of apneic or  hypopneic events.  The total minutes with oxygen saturation below 88% is 164.  This suggests hypoventilation.  loud snoring was noted during this study.   Argie RammingKofi A Laelia Angelo, MD Diplomate, American Board of Sleep Medicine.  ELECTRONICALLY SIGNED ON:  05/09/2018, 4:48 PM [facility] PH: [phone]   FX: [phone] ACCREDITED BY THE AMERICAN ACADEMY OF SLEEP MEDICINE   CARDIAC DATA The 2 lead EKG demonstrated sinus rhythm. The mean heart rate was 74.2 beats per minute. Other EKG findings include: PVCs. LEG MOVEMENT DATA The total PLMS were 0 with a resulting PLMS index of 0.0. Associated arousal with leg movement index was 0.0.  IMPRESSIONS 1.  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  A formal CPAP titration study is recommended.   2.  Hypoventilation syndrome is also suggested.

## 2018-05-10 ENCOUNTER — Encounter: Payer: Self-pay | Admitting: Cardiovascular Disease

## 2018-05-10 ENCOUNTER — Ambulatory Visit (INDEPENDENT_AMBULATORY_CARE_PROVIDER_SITE_OTHER): Payer: Medicare PPO | Admitting: Cardiovascular Disease

## 2018-05-10 ENCOUNTER — Other Ambulatory Visit: Payer: Self-pay

## 2018-05-10 VITALS — BP 126/80 | HR 70 | Ht 66.0 in | Wt 265.4 lb

## 2018-05-10 DIAGNOSIS — I350 Nonrheumatic aortic (valve) stenosis: Secondary | ICD-10-CM

## 2018-05-10 LAB — BASIC METABOLIC PANEL
BUN/Creatinine Ratio: 37 — ABNORMAL HIGH (ref 12–28)
BUN: 27 mg/dL (ref 8–27)
CO2: 24 mmol/L (ref 20–29)
Calcium: 9.5 mg/dL (ref 8.7–10.3)
Chloride: 103 mmol/L (ref 96–106)
Creatinine, Ser: 0.73 mg/dL (ref 0.57–1.00)
GFR calc Af Amer: 92 mL/min/{1.73_m2} (ref >59)
GFR calc non Af Amer: 80 mL/min/{1.73_m2} (ref >59)
Glucose: 97 mg/dL (ref 65–99)
Potassium: 4.2 mmol/L (ref 3.5–5.2)
Sodium: 141 mmol/L (ref 134–144)

## 2018-05-10 LAB — PULMONARY FUNCTION TEST

## 2018-05-10 MED ORDER — METOPROLOL TARTRATE 50 MG PO TABS: ORAL_TABLET | ORAL | 0 refills | Status: DC

## 2018-05-10 NOTE — Progress Notes (Signed)
Valve Clinic Consult Note  Chief Complaint  Patient presents with  . Follow-up    severe aortic stenosis   History of Present Illness: 78 yo female with history of morbid obesity, hyperlipidemia, former tobacco abuse, prior mitral valve endocarditis, diastolic CHF, TIA in 1937, cephalic vein thrombosis 9024, arthritis and severe aortic stenosis here today for follow up in the valve clinic to discuss her aortic stenosis and possible TAVR. She was referred by Dr. Domenic Polite.  I met her in November 2019 when we had the initial visit to discuss her aortic stenosis. She has been followed for aortic stenosis while living in [facility] and recently moved to [place] to be near her family.  I was able to get the records from [facility]. She was treated for mitral valve endocarditis in the setting of cellulitis in February 2019 in [place], [place]. She was also found to have severe aortic stenosis. She was seen there by the TAVR team. (Dr. Louis Meckel of CT surgery). She had a cardiac catheterization in [place], [place] on 06/20/17. I personally reviewed those cath films and she has minimal CAD. Right heart pressures (RA 10, RV 45/9, PA: 53/13 PW: 16 AO: 108/67, LV 173/15/25. CO 7.24 L/min, CI: 4.4 L/min/m2, AVA 1.08 cm2, AV gadient 45 mmHg). This cath report was scanned into our system and her images have been loaded into the viewing system. Echo 04/13/17 in [place], [place] with AVA 0.9 cm2, mean gradient 44 mmHg, peak gradient 74 mmHg. Reports from that hospitalization mention heavy MAC with mean gradient 7 mmHg. She had a CTA of her abdomen and pelvis at [facility]. The report is scanned into [facility]. There was adequate iliac/femoral access for TAVR. She was noted to have bilateral femoral artery access for potential TAVR by Dr. Hardin Negus. PFTs from [facility] scanned into [facility] ([employer] tab). At that time, she was being treated for choledocholithiasis and had a recent drain in place. ERCP had been performed 05/24/17  and her drain was removed 06/15/17. It is not clear if she followed up with TAVR team in [place] at [facility] prior to moving to [facility]. She saw Dr. Domenic Polite in September 2019 and he arranged an echo which showed normal LV systolic function with severe LVH and severe aortic stenosis (mean gradient 51 mmHg, DVI 0.18, AVA 0.69 cm2). She was admitted to [facility] 02/06/18 with recurrent acute cholecystitis complicated by choledocholithiasis and sepsis, treated with broad-spectrum antibiotics.  MRCP demonstrated biliary ductal dilatation with a 7 mm distal common bile duct stone.  She was seen for surgical consultation however ultimately underwent endoscopic ultrasound and ERCP with successful stone extraction and biliary sphincterotomy.  She will ultimately require a cholecystectomy. When I met her in November 2019 the drain was still in place. Her drain has been removed. She has been seen by Dr. Donne Hazel with [facility] and he feels that her cholecystectomy can wait until after her TAVR is completed. The risk of recurrent cholecystitis is high but the ERCP and sphincterotomy will lower her risk of common bile duct stones per Dr. Donne Hazel. She will also need dental extractions.   She has no change in symptoms since her I saw her. She has chronic LE edema. She has dyspnea with exertion and fatigue.  She uses a walker at home but also uses a wheelchair. She can walk 100 feet before she needs tor rest. She is mostly limited by hip and knee pain. She lives with her husband in  [facility]. Her daughter lives nearby.   Primary Care Physician: [name], MD Primary Cardiologist: Rozann Lesches Referring Cardiologist: Rozann Lesches  Past Medical History:  Diagnosis Date  . Aortic stenosis   . Cholecystitis   . Chronic diastolic CHF (congestive heart failure), NYHA class 2 ([place])   . Depression   . History of cellulitis   . History of endocarditis   .  Hyperlipemia   . Morbid obesity ([place])   . Neuropathy   . TIA (transient ischemic attack)   . Tobacco abuse     Past Surgical History:  Procedure Laterality Date  . Abdominal gangrene    . ADENOIDECTOMY    . Cataract surgery    . COLONOSCOPY    . ENDOSCOPIC RETROGRADE CHOLANGIOPANCREATOGRAPHY (ERCP) WITH PROPOFOL N/A 02/08/2018   Procedure: ENDOSCOPIC RETROGRADE CHOLANGIOPANCREATOGRAPHY (ERCP) WITH PROPOFOL;  Surgeon: Rush Landmark Telford Nab., MD;  Location: [place];  Service: Gastroenterology;  Laterality: N/A;  . EUS  02/08/2018   Procedure: UPPER ENDOSCOPIC ULTRASOUND (EUS) LINEAR;  Surgeon: Irving Copas., MD;  Location: [place];  Service: Gastroenterology;;  . IR FLUORO RM 30-60 MIN  03/27/2018  . IR PERC CHOLECYSTOSTOMY  02/09/2018  . IR RADIOLOGIST EVAL & MGMT  03/26/2018  . REMOVAL OF STONES  02/08/2018   Procedure: REMOVAL OF STONES;  Surgeon: Rush Landmark Telford Nab., MD;  Location: [place];  Service: Gastroenterology;;  . Joan Mayans  02/08/2018   Procedure: Joan Mayans;  Surgeon: Mansouraty, Telford Nab., MD;  Location: [place];  Service: Gastroenterology;;  . TONSILLECTOMY    . Tummy tuck      Current Outpatient Medications  Medication Sig Dispense Refill  . acetaminophen (TYLENOL) 325 MG tablet Take 2 tablets (650 mg total) by mouth every 6 (six) hours as needed for mild pain (or Fever >/= 101). 20 tablet 0  . Ascorbic Acid (VITAMIN C) 100 MG tablet Take 100 mg by mouth daily.    Marland Kitchen aspirin EC 81 MG tablet Take 81 mg by mouth daily.    . Calcium Carbonate (CALCIUM-CARB 600 PO) Take 600 mg by mouth daily.    . cholecalciferol (VITAMIN D) 1000 units tablet Take 1,000 Units by mouth daily.    . ferrous sulfate 325 (65 FE) MG tablet Take 325 mg by mouth daily with breakfast.    . gabapentin (NEURONTIN) 300 MG capsule Take 300 mg by mouth 3 (three) times daily.    . Multiple Vitamin (MULTIVITAMIN WITH MINERALS) TABS tablet Take 1 tablet by  mouth daily.    Marland Kitchen nystatin ointment (MYCOSTATIN) Apply 1 application topically 2 (two) times daily as needed.  2  . pravastatin (PRAVACHOL) 20 MG tablet Take 20 mg by mouth daily.    Marland Kitchen venlafaxine (EFFEXOR) 37.5 MG tablet Take 37.5 mg by mouth daily.     No current facility-administered medications for this visit.     No Known Allergies  Social History   Socioeconomic History  . Marital status: Married    Spouse name: Not on file  . Number of children: 3  . Years of education: Not on file  . Highest education level: Not on file  Occupational History  . Occupation: Retired Cytogeneticist  . Financial resource strain: Not on file  . Food insecurity:    Worry: Not on file    Inability: Not on file  . Transportation needs:    Medical: Not on file    Non-medical: Not on file  Tobacco Use  . Smoking status: Former  Smoker    Types: Cigarettes    Last attempt to quit: 05/23/1978    Years since quitting: 39.9  . Smokeless tobacco: Never Used  . Tobacco comment: only 1 pack pewr week   Substance and Sexual Activity  . Alcohol use: Yes    Comment: Occasional  . Drug use: Never  . Sexual activity: Not on file  Lifestyle  . Physical activity:    Days per week: Not on file    Minutes per session: Not on file  . Stress: Not on file  Relationships  . Social connections:    Talks on phone: Not on file    Gets together: Not on file    Attends religious service: Not on file    Active member of club or organization: Not on file    Attends meetings of clubs or organizations: Not on file    Relationship status: Not on file  . Intimate partner violence:    Fear of current or ex partner: Not on file    Emotionally abused: Not on file    Physically abused: Not on file    Forced sexual activity: Not on file  Other Topics Concern  . Not on file  Social History Narrative  . Not on file    Family History  Problem Relation Age of Onset  . Cancer Mother   . Hypertension  Father   . Arthritis Father   . Heart attack Father     Review of Systems:  As stated in the HPI and otherwise negative.   BP 126/80   Pulse 70   Ht 5' 6"  (1.676 m)   Wt 265 lb 6.4 oz (120.4 kg)   SpO2 95%   BMI 42.84 kg/m   Physical Examination:  General: Obese female in NAD.  HEENT: OP clear, mucus membranes moist  SKIN: warm, dry. No rashes. Neuro: No focal deficits  Musculoskeletal: Muscle strength 5/5 all ext  Psychiatric: Mood and affect normal  Neck: No JVD, no carotid bruits, no thyromegaly, no lymphadenopathy.  Lungs:Clear bilaterally, no wheezes, rhonci, crackles Cardiovascular: Regular rate and rhythm. Loud, harsh systolic murmur.  Abdomen:Soft. Bowel sounds present. Non-tender.  Extremities:  1+ bilateral  lower extremity edema with chronic venous stasis changes.    Echo 02/01/18: Left ventricle: The cavity size was normal. Wall thickness was   increased in a pattern of severe LVH. Systolic function was   normal. The estimated ejection fraction was in the range of 55%   to 60%. Wall motion was normal; there were no regional wall   motion abnormalities. Doppler parameters are consistent with   abnormal left ventricular relaxation (grade 1 diastolic   dysfunction). Doppler parameters are consistent with high   ventricular filling pressure. - Aortic valve: Severely calcified annulus. Trileaflet; severely   thickened leaflets. There was severe stenosis. Mean gradient (S):   51 mm Hg. VTI ratio of LVOT to aortic valve: 0.18. Valve area   (VTI): 0.67 cm^2. Valve area (Vmax): 0.76 cm^2. Valve area   (Vmean): 0.67 cm^2. - Mitral valve: Severely calcified annulus. The findings are   consistent with mild stenosis. Mean gradient (D): 3 mm Hg. Valve   area by pressure half-time: 2.18 cm^2. Valve area by continuity   equation (using LVOT flow): 1.8 cm^2. - Left atrium: The atrium was moderately dilated. - Atrial septum: There was increased thickness of the septum,    consistent with lipomatous hypertrophy. No defect or patent   foramen ovale was identified.  -------------------------------------------------------------------  Study data:   Study status:  Routine.  Procedure:  Transthoracic echocardiography. Image quality was adequate.  Study completion: There were no complications.          Transthoracic echocardiography.  M-mode, complete 2D, spectral Doppler, and color Doppler.  Birthdate:  Patient birthdate: 1940/10/10.  Age:  Patient is 78 yr old.  Sex:  Gender: female.    BMI: 44.3 kg/m^2.  Blood pressure:     156/88  Patient status:  Outpatient.  Study date: Study date: 02/01/2018. Study time: 01:18 PM.  Location:  Echo laboratory.  -------------------------------------------------------------------  ------------------------------------------------------------------- Left ventricle:  The cavity size was normal. Wall thickness was increased in a pattern of severe LVH. Systolic function was normal. The estimated ejection fraction was in the range of 55% to 60%. Wall motion was normal; there were no regional wall motion abnormalities. Doppler parameters are consistent with abnormal left ventricular relaxation (grade 1 diastolic dysfunction). Doppler parameters are consistent with high ventricular filling pressure.   ------------------------------------------------------------------- Aortic valve:   Severely calcified annulus. Trileaflet; severely thickened leaflets.  Doppler:   There was severe stenosis.   There was no significant regurgitation.    VTI ratio of LVOT to aortic valve: 0.18. Valve area (VTI): 0.67 cm^2. Indexed valve area (VTI): 0.27 cm^2/m^2. Peak velocity ratio of LVOT to aortic valve: 0.2. Valve area (Vmax): 0.76 cm^2. Indexed valve area (Vmax): 0.31 cm^2/m^2. Mean velocity ratio of LVOT to aortic valve: 0.18. Valve area (Vmean): 0.67 cm^2. Indexed valve area (Vmean): 0.27 cm^2/m^2.    Mean gradient (S): 51 mm Hg. Peak  gradient (S): 80 mm Hg.  ------------------------------------------------------------------- Aorta:  Aortic root: The aortic root was normal in size.  ------------------------------------------------------------------- Mitral valve:   Severely calcified annulus.  Doppler:   The findings are consistent with mild stenosis.   There was no significant regurgitation.    Valve area by pressure half-time: 2.18 cm^2. Indexed valve area by pressure half-time: 0.88 cm^2/m^2. Valve area by continuity equation (using LVOT flow): 1.8 cm^2. Indexed valve area by continuity equation (using LVOT flow): 0.73 cm^2/m^2.    Mean gradient (D): 3 mm Hg. Peak gradient (D): 3 mm Hg.  ------------------------------------------------------------------- Left atrium:  The atrium was moderately dilated.  ------------------------------------------------------------------- Atrial septum:  There was increased thickness of the septum, consistent with lipomatous hypertrophy. No defect or patent foramen ovale was identified.  ------------------------------------------------------------------- Right ventricle:  The cavity size was normal. Wall thickness was normal. Systolic function was normal.  ------------------------------------------------------------------- Pulmonic valve:   Not well visualized.  Doppler:   There was no evidence for stenosis.   There was no significant regurgitation.   ------------------------------------------------------------------- Tricuspid valve:   Normal thickness leaflets.  Doppler:   There was no evidence for stenosis.   There was no significant regurgitation.   ------------------------------------------------------------------- Pulmonary artery:    Systolic pressure could not be accurately estimated.   Inadequate TR jet.  ------------------------------------------------------------------- Right atrium:  The atrium was normal in  size.  ------------------------------------------------------------------- Pericardium:  There was no pericardial effusion.  ------------------------------------------------------------------- Systemic veins: Inferior vena cava: The vessel was normal in size. The respirophasic diameter changes were in the normal range (>= 50%), consistent with normal central venous pressure.  ------------------------------------------------------------------- Measurements   Left ventricle                          Value           Reference  LV ID, ED, PLAX chordal         (  L)     41.1   mm       43 - 52  LV ID, ES, PLAX chordal                 23.5   mm       23 - 38  LV fx shortening, PLAX chordal          43     %        >=29  LV PW thickness, ED                     15     mm       ----------  IVS/LV PW ratio, ED                     1.01            <=1.3  Stroke volume, 2D                       71     ml       ----------  Stroke volume/bsa, 2D                   29     ml/m^2   ----------  LV e&', lateral                          6.74   cm/s     ----------  LV E/e&', lateral                        13.61           ----------  LV e&', medial                           4.13   cm/s     ----------  LV E/e&', medial                         22.2            ----------  LV e&', average                          5.44   cm/s     ----------  LV E/e&', average                        16.87           ----------  LV ejection time                        300    ms       ----------    Ventricular septum                      Value           Reference  IVS thickness, ED                       15.2   mm       ----------    LVOT  Value           Reference  LVOT ID, S                              22     mm       ----------  LVOT area                               3.8    cm^2     ----------  LVOT peak velocity, S                   89.04  cm/s     ----------  LVOT mean velocity, S                    59.6   cm/s     ----------  LVOT VTI, S                             17.59  cm       ----------  Stroke volume (SV), LVOT DP             66.9   ml       ----------  Stroke index (SV/bsa), LVOT DP          27     ml/m^2   ----------    Aortic valve                            Value           Reference  Aortic valve peak velocity, S           446.38 cm/s     ----------  Aortic valve mean velocity, S           340.05 cm/s     ----------  Aortic valve VTI, S                     99.38  cm       ----------  Aortic mean gradient, S                 51     mm Hg    ----------  Aortic peak gradient, S                 80     mm Hg    ----------  VTI ratio, LVOT/AV                      0.18            ----------  Aortic valve area, VTI                  0.67   cm^2     ----------  Aortic valve area/bsa, VTI              0.27   cm^2/m^2 ----------  Velocity ratio, peak, LVOT/AV           0.2             ----------  Aortic valve area, peak                 0.76   cm^2     ----------  velocity  Aortic valve area/bsa, peak             0.31   cm^2/m^2 ----------  velocity  Velocity ratio, mean, LVOT/AV           0.18            ----------  Aortic valve area, mean                 0.67   cm^2     ----------  velocity  Aortic valve area/bsa, mean             0.27   cm^2/m^2 ----------  velocity    Aorta                                   Value           Reference  Aortic root ID, ED                      35     mm       ----------    Left atrium                             Value           Reference  LA ID, A-P, ES                          38     mm       ----------  LA ID/bsa, A-P                          1.54   cm/m^2   <=2.2  LA volume, S                            88     ml       ----------  LA volume/bsa, S                        35.6   ml/m^2   ----------  LA volume, ES, 1-p A4C                  82.6   ml       ----------  LA volume/bsa, ES, 1-p A4C              33.4   ml/m^2   ----------   LA volume, ES, 1-p A2C                  91.4   ml       ----------  LA volume/bsa, ES, 1-p A2C              37     ml/m^2   ----------    Mitral valve                            Value           Reference  Mitral E-wave peak velocity             91.7   cm/s     ----------  Mitral A-wave peak velocity  118    cm/s     ----------  Mitral mean velocity, D                 80.7   cm/s     ----------  Mitral deceleration time        (H)     268    ms       150 - 230  Mitral pressure half-time               101    ms       ----------  Mitral mean gradient, D                 3      mm Hg    ----------  Mitral peak gradient, D                 3      mm Hg    ----------  Mitral E/A ratio, peak                  0.8             ----------  Mitral valve area, PHT, DP              2.18   cm^2     ----------  Mitral valve area/bsa, PHT, DP          0.88   cm^2/m^2 ----------  Mitral valve area, LVOT                 1.8    cm^2     ----------  continuity  Mitral valve area/bsa, LVOT             0.73   cm^2/m^2 ----------  continuity  Mitral annulus VTI, D                   39.2   cm       ----------    Right atrium                            Value           Reference  RA ID, S-I, ES, A4C             (H)     58.9   mm       34 - 49  RA area, ES, A4C                        18     cm^2     8.3 - 19.5  RA volume, ES, A/L                      47.5   ml       ----------  RA volume/bsa, ES, A/L                  19.2   ml/m^2   ----------    Systemic veins                          Value           Reference  Estimated CVP                           3  mm Hg    ----------    Right ventricle                         Value           Reference  TAPSE                                   21.4   mm       ----------  RV s&', lateral, S                       14.3   cm/s     ----------  EKG:  EKG is not  ordered today. The ekg ordered today demonstrates   Recent Labs: 02/10/2018: ALT 17 02/11/2018: BUN  14; Creatinine, Ser 0.80; Potassium 3.9; Sodium 138 02/12/2018: Hemoglobin 10.5; Platelets 226   Lipid Panel No results found for: CHOL, TRIG, HDL, CHOLHDL, VLDL, LDLCALC, LDLDIRECT   Wt Readings from Last 3 Encounters:  05/10/18 265 lb 6.4 oz (120.4 kg)  03/11/18 286 lb (129.7 kg)  03/06/18 265 lb (120.2 kg)     Other studies Reviewed: Additional studies/ records that were reviewed today include: . Review of the above records demonstrates:    STS Risk Score: Isolated AVR Risk of Mortality: 1.737%  Renal Failure: 1.792%  Permanent Stroke: 1.055%  Prolonged Ventilation: 7.177%  DSW Infection: 0.247%  Reoperation: 3.097%  Morbidity or Mortality: 10.990%  Short Length of Stay: 28.133%  Long Length of Stay: 6.074%   Assessment and Plan:   1. Severe aortic stenosis: She has severe aortic stenosis. She is limited by knee and hip pain but does walk at home with the use of a walker. She also uses a wheelchair. She has dyspnea with exertion and fatigue. Her aortic valve is thickened and calcified with limited leaflet mobility. She would benefit from AVR. She is not a candidate for surgical AVR. We will continue workup for TAVR. She has been seen in [facility] by Dr. Donne Hazel and he would like to postpone her cholecystectomy until her TAVR is complete. Her drain has been removed. She is s/p ERCP and sphincterotomy. She is having no active gallbladder issues.    I have reviewed the natural history of aortic stenosis with the patient and their family members  who are present today. We have discussed the limitations of medical therapy and the poor prognosis associated with symptomatic aortic stenosis. We have reviewed potential treatment options, including palliative medical therapy, conventional surgical aortic valve replacement, and transcatheter aortic valve replacement. We discussed treatment options in the context of the patient's specific comorbid medical conditions.     We will continue planning for TAVR. She will not need a repeat cardiac cath. I have personally reviewed the cath films from [place], [place] in February 2019 and she has minimal plaque in her coronaries. She will be cleared at this time for dental extraction. Following her dental extraction, we will proceed with the gated cardiac CT, CTA of the chest abdomen and pelvis, carotid dopplers. She will need a PT assessment. PFTs from [facility] reviewed and will not be repeated. She will be referred to see one of the CT surgeons on our TAVR team after her testing is complete.   2. Pre-operative cardiac risk assessment: She is cleared for dental extraction.   Current medicines are reviewed at length  with the patient today.  The patient does not have concerns regarding medicines.  The following changes have been made:  no change  Labs/ tests ordered today include:   Orders Placed This Encounter  Procedures  . Basic Metabolic Panel (BMET)     Disposition:   FU with the TAVR team.   Signed, Lauree Chandler, MD 05/10/2018 10:19 AM    [facility] [address] [place], [place], [place]  [address] Phone: [phone]; Fax: [phone]

## 2018-05-10 NOTE — Patient Instructions (Signed)
Medication Instructions:  Your physician recommends that you continue on your current medications as directed. Please refer to the Current Medication list given to you today.  If you need a refill on your cardiac medications before your next appointment, please call your pharmacy.   Lab work: Lab work to be done today--BMP If you have labs (blood work) drawn today and your tests are completely normal, you will receive your results only by: Marland Kitchen MyChart Message (if you have MyChart) OR . A paper copy in the mail If you have any lab test that is abnormal or we need to change your treatment, we will call you to review the results.  Testing/Procedures: none

## 2018-05-13 DIAGNOSIS — K8043 Calculus of bile duct with acute cholecystitis with obstruction: Secondary | ICD-10-CM | POA: Diagnosis not present

## 2018-05-13 DIAGNOSIS — M6281 Muscle weakness (generalized): Secondary | ICD-10-CM | POA: Diagnosis not present

## 2018-05-13 DIAGNOSIS — Z792 Long term (current) use of antibiotics: Secondary | ICD-10-CM | POA: Diagnosis not present

## 2018-05-13 DIAGNOSIS — G629 Polyneuropathy, unspecified: Secondary | ICD-10-CM | POA: Diagnosis not present

## 2018-05-13 DIAGNOSIS — G4730 Sleep apnea, unspecified: Secondary | ICD-10-CM | POA: Diagnosis not present

## 2018-05-13 DIAGNOSIS — F329 Major depressive disorder, single episode, unspecified: Secondary | ICD-10-CM | POA: Diagnosis not present

## 2018-05-13 DIAGNOSIS — Z6841 Body Mass Index (BMI) 40.0 and over, adult: Secondary | ICD-10-CM | POA: Diagnosis not present

## 2018-05-13 DIAGNOSIS — I350 Nonrheumatic aortic (valve) stenosis: Secondary | ICD-10-CM | POA: Diagnosis not present

## 2018-05-15 DIAGNOSIS — Z792 Long term (current) use of antibiotics: Secondary | ICD-10-CM | POA: Diagnosis not present

## 2018-05-15 DIAGNOSIS — G4730 Sleep apnea, unspecified: Secondary | ICD-10-CM | POA: Diagnosis not present

## 2018-05-15 DIAGNOSIS — G629 Polyneuropathy, unspecified: Secondary | ICD-10-CM | POA: Diagnosis not present

## 2018-05-15 DIAGNOSIS — Z6841 Body Mass Index (BMI) 40.0 and over, adult: Secondary | ICD-10-CM | POA: Diagnosis not present

## 2018-05-15 DIAGNOSIS — M6281 Muscle weakness (generalized): Secondary | ICD-10-CM | POA: Diagnosis not present

## 2018-05-15 DIAGNOSIS — F329 Major depressive disorder, single episode, unspecified: Secondary | ICD-10-CM | POA: Diagnosis not present

## 2018-05-15 DIAGNOSIS — K8043 Calculus of bile duct with acute cholecystitis with obstruction: Secondary | ICD-10-CM | POA: Diagnosis not present

## 2018-05-15 DIAGNOSIS — I350 Nonrheumatic aortic (valve) stenosis: Secondary | ICD-10-CM | POA: Diagnosis not present

## 2018-05-21 ENCOUNTER — Ambulatory Visit: Payer: Medicare PPO | Admitting: Cardiology

## 2018-05-21 ENCOUNTER — Telehealth (HOSPITAL_COMMUNITY): Payer: Self-pay | Admitting: Emergency Medicine

## 2018-05-21 DIAGNOSIS — I350 Nonrheumatic aortic (valve) stenosis: Secondary | ICD-10-CM | POA: Diagnosis not present

## 2018-05-21 DIAGNOSIS — M6281 Muscle weakness (generalized): Secondary | ICD-10-CM | POA: Diagnosis not present

## 2018-05-21 DIAGNOSIS — K8043 Calculus of bile duct with acute cholecystitis with obstruction: Secondary | ICD-10-CM | POA: Diagnosis not present

## 2018-05-21 DIAGNOSIS — Z6841 Body Mass Index (BMI) 40.0 and over, adult: Secondary | ICD-10-CM | POA: Diagnosis not present

## 2018-05-21 DIAGNOSIS — G4730 Sleep apnea, unspecified: Secondary | ICD-10-CM | POA: Diagnosis not present

## 2018-05-21 DIAGNOSIS — F329 Major depressive disorder, single episode, unspecified: Secondary | ICD-10-CM | POA: Diagnosis not present

## 2018-05-21 DIAGNOSIS — Z792 Long term (current) use of antibiotics: Secondary | ICD-10-CM | POA: Diagnosis not present

## 2018-05-21 DIAGNOSIS — G629 Polyneuropathy, unspecified: Secondary | ICD-10-CM | POA: Diagnosis not present

## 2018-05-21 NOTE — Telephone Encounter (Signed)
Reaching out to patient to offer assistance regarding upcoming cardiac imaging study; pt verbalizes understanding of appt date/time, parking situation and where to check in, pre-test NPO status and medications ordered, and verified current allergies; name and call back number provided for further questions should they arise Timisha Mondry RN Navigator Cardiac Imaging [facility] [phone] office [phone] cell 

## 2018-05-22 ENCOUNTER — Ambulatory Visit (HOSPITAL_COMMUNITY): Admission: RE | Admit: 2018-05-22 | Payer: Medicare PPO | Source: Ambulatory Visit

## 2018-05-22 ENCOUNTER — Ambulatory Visit (HOSPITAL_COMMUNITY)
Admission: RE | Admit: 2018-05-22 | Discharge: 2018-05-22 | Disposition: A | Discharge status: Home / Self Care | Payer: Medicare PPO | Source: Ambulatory Visit | Attending: Internal Medicine | Admitting: Internal Medicine

## 2018-05-22 ENCOUNTER — Encounter (HOSPITAL_COMMUNITY): Payer: Self-pay

## 2018-05-22 ENCOUNTER — Other Ambulatory Visit (HOSPITAL_COMMUNITY): Payer: Self-pay | Admitting: Internal Medicine

## 2018-05-22 ENCOUNTER — Telehealth: Payer: Self-pay

## 2018-05-22 ENCOUNTER — Ambulatory Visit (HOSPITAL_COMMUNITY)
Admission: RE | Admit: 2018-05-22 | Discharge: 2018-05-22 | Disposition: A | Discharge status: Home / Self Care | Payer: Medicare PPO | Source: Ambulatory Visit | Attending: Cardiovascular Disease | Admitting: Cardiovascular Disease

## 2018-05-22 DIAGNOSIS — I350 Nonrheumatic aortic (valve) stenosis: Secondary | ICD-10-CM | POA: Diagnosis not present

## 2018-05-22 DIAGNOSIS — I872 Venous insufficiency (chronic) (peripheral): Secondary | ICD-10-CM | POA: Diagnosis not present

## 2018-05-22 DIAGNOSIS — R079 Chest pain, unspecified: Secondary | ICD-10-CM

## 2018-05-22 HISTORY — PX: IR RADIOLOGY PERIPHERAL GUIDED IV START: IMG5598

## 2018-05-22 HISTORY — PX: IR US GUIDE VASC ACCESS RIGHT: IMG2390

## 2018-05-22 MED ORDER — LIDOCAINE HCL 1 % IJ SOLN
INTRAMUSCULAR | Status: AC | PRN
  Administered 2018-05-22: 5 mL

## 2018-05-22 NOTE — Progress Notes (Signed)
Patient in CT started CT and CT technologist stated CT infused 20 mL. Notified PA who will assess and attempt IV for CT. Patient denies pain removed left AC IV radial pulse +2 full sensation. Applied pressure dressing and instructed patient to elevate arm. Patient and family member at bedside verbalized understanding of instructions.

## 2018-05-22 NOTE — Progress Notes (Signed)
Successful US guided (R)basilic micropuncture IV start. No complications.  (L)UE PIV site assessed. No swelling or skin changes, dressing intact.  Brayton El PA-C Interventional Radiology 05/22/2018 3:35 PM

## 2018-05-22 NOTE — Telephone Encounter (Signed)
[facility] advised that the pt was there today for CT Heart but they could not get her IV.Marland Kitchen. she was stuck 7 times and the IV they did get infiltrated with contrast so their protocol is to stop and reschedule the pt.. and they will talk with the pt about another day to return for the test. Will forward to Dr. Clifton JamesMcalhany for his review.

## 2018-05-22 NOTE — Progress Notes (Signed)
After 2 10cc saline flushes, line infiltrated.  Line removed and dressing applied.

## 2018-05-22 NOTE — Progress Notes (Signed)
Unable to obtain IV on patient for CT scan. Patients Md made aware.

## 2018-05-23 ENCOUNTER — Telehealth (HOSPITAL_COMMUNITY): Payer: Self-pay | Admitting: Emergency Medicine

## 2018-05-23 DIAGNOSIS — Z792 Long term (current) use of antibiotics: Secondary | ICD-10-CM | POA: Diagnosis not present

## 2018-05-23 DIAGNOSIS — F329 Major depressive disorder, single episode, unspecified: Secondary | ICD-10-CM | POA: Diagnosis not present

## 2018-05-23 DIAGNOSIS — K8043 Calculus of bile duct with acute cholecystitis with obstruction: Secondary | ICD-10-CM | POA: Diagnosis not present

## 2018-05-23 DIAGNOSIS — G4730 Sleep apnea, unspecified: Secondary | ICD-10-CM | POA: Diagnosis not present

## 2018-05-23 DIAGNOSIS — Z6841 Body Mass Index (BMI) 40.0 and over, adult: Secondary | ICD-10-CM | POA: Diagnosis not present

## 2018-05-23 DIAGNOSIS — G629 Polyneuropathy, unspecified: Secondary | ICD-10-CM | POA: Diagnosis not present

## 2018-05-23 DIAGNOSIS — M6281 Muscle weakness (generalized): Secondary | ICD-10-CM | POA: Diagnosis not present

## 2018-05-23 DIAGNOSIS — I350 Nonrheumatic aortic (valve) stenosis: Secondary | ICD-10-CM | POA: Diagnosis not present

## 2018-05-23 NOTE — Telephone Encounter (Signed)
Left message on voicemail with name and callback number Rockwell Alexandria RN Navigator Cardiac Imaging [facility] [phone] Office [phone] Cell    Informing daughter Huntley Dec) of patients new appointments: Carotid doppler 05/30/18 at 1p TAVR CTs 05/30/18 at 3:30p  Appointment with Dr. Cornelius Moras 06/06/18 at 4p

## 2018-05-24 NOTE — Telephone Encounter (Signed)
Thanks

## 2018-05-27 ENCOUNTER — Other Ambulatory Visit: Payer: Self-pay

## 2018-05-27 ENCOUNTER — Encounter: Payer: Self-pay | Admitting: Thoracic Surgery (Cardiothoracic Vascular Surgery)

## 2018-05-27 ENCOUNTER — Ambulatory Visit: Payer: Medicare PPO | Attending: Cardiovascular Disease | Admitting: Physical Therapy

## 2018-05-27 ENCOUNTER — Encounter: Payer: Self-pay | Admitting: Physical Therapy

## 2018-05-27 DIAGNOSIS — M6281 Muscle weakness (generalized): Secondary | ICD-10-CM

## 2018-05-27 DIAGNOSIS — R2689 Other abnormalities of gait and mobility: Secondary | ICD-10-CM | POA: Insufficient documentation

## 2018-05-27 NOTE — Therapy (Signed)
[facility] [address] [place], [place], [address] Phone: [phone]   Fax:  [phone]  Physical Therapy Evaluation  Patient Details  Name: Dawn Gonzales MRN: [id number] Date of Birth: 1940/08/13 Referring Provider (PT): Kathleene Hazel, MD   Encounter Date: 05/27/2018  PT End of Session - 05/27/18 1242    Visit Number  1    Number of Visits  1    Date for PT Re-Evaluation  05/28/18    PT Start Time  1146    PT Stop Time  1225    PT Time Calculation (min)  39 min    Activity Tolerance  Patient tolerated treatment well    Behavior During Therapy  [facility] for tasks assessed/performed       Past Medical History:  Diagnosis Date  . Aortic stenosis   . Cholecystitis   . Chronic diastolic CHF (congestive heart failure), NYHA class 2 (HCC)   . Depression   . History of cellulitis   . History of endocarditis   . Hyperlipemia   . Morbid obesity (HCC)   . Neuropathy   . TIA (transient ischemic attack)   . Tobacco abuse     Past Surgical History:  Procedure Laterality Date  . Abdominal gangrene    . ADENOIDECTOMY    . Cataract surgery    . COLONOSCOPY    . ENDOSCOPIC RETROGRADE CHOLANGIOPANCREATOGRAPHY (ERCP) WITH PROPOFOL N/A 02/08/2018   Procedure: ENDOSCOPIC RETROGRADE CHOLANGIOPANCREATOGRAPHY (ERCP) WITH PROPOFOL;  Surgeon: Meridee Score Netty Starring., MD;  Location: [facility] ENDOSCOPY;  Service: Gastroenterology;  Laterality: N/A;  . EUS  02/08/2018   Procedure: UPPER ENDOSCOPIC ULTRASOUND (EUS) LINEAR;  Surgeon: Lemar Lofty., MD;  Location: [facility] ENDOSCOPY;  Service: Gastroenterology;;  . IR FLUORO RM 30-60 MIN  03/27/2018  . IR PERC CHOLECYSTOSTOMY  02/09/2018  . IR RADIOLOGIST EVAL & MGMT  03/26/2018  . IR RADIOLOGY PERIPHERAL GUIDED IV START  05/22/2018  . IR US GUIDE VASC ACCESS RIGHT  05/22/2018  . REMOVAL OF STONES  02/08/2018   Procedure: REMOVAL OF STONES;  Surgeon: Meridee Score Netty Starring., MD;   Location: [facility] ENDOSCOPY;  Service: Gastroenterology;;  . Dennison Mascot  02/08/2018   Procedure: Dennison Mascot;  Surgeon: Mansouraty, Netty Starring., MD;  Location: [facility] ENDOSCOPY;  Service: Gastroenterology;;  . TONSILLECTOMY    . Tummy tuck      There were no vitals filed for this visit.   Subjective Assessment - 05/27/18 1153    Subjective  pt is a 78 y.o F with CC of SOB that she has been having for a while and per pt's daughter she has been progessively worsening over the last years. pt currently use a W/C coming into the clinic. pt reports hx of R knee pain as well as the back.     Patient Stated Goals  to get the heart    Currently in Pain?  Yes    Pain Score  1    at worst 8-9/10   Pain Location  Knee    Pain Orientation  Right    Pain Type  Chronic pain    Pain Onset  More than a month ago    Pain Frequency  Intermittent    Aggravating Factors   standing/ walking    Pain Relieving Factors  sitting/ resting         [facility] PT Assessment - 05/27/18 1156      Assessment   Medical Diagnosis  Severe Aortic Stenosis    Referring Provider (  PT)  Kathleene Hazel, MD    Onset Date/Surgical Date  --   over a year ago   Hand Dominance  Right      Precautions   Precautions  None      Restrictions   Weight Bearing Restrictions  No      Balance Screen   Has the patient fallen in the past 6 months  No    Has the patient had a decrease in activity level because of a fear of falling?   No    Is the patient reluctant to leave their home because of a fear of falling?   No      Home Public house manager residence    Living Arrangements  Spouse/significant other    Available Help at Discharge  Family    Type of Home  House    Home Access  Ramped entrance    Home Layout  One level    Home Equipment  Wheelchair - manual;Walker - 2 wheels;Shower seat;Grab bars - toilet;Hospital bed;Bedside commode      ROM / Strength   AROM / PROM / Strength   AROM;Strength      AROM   Overall AROM Comments  limited bil shoulder external rotation all other motions WFL      Strength   Overall Strength  Within functional limits for tasks performed    Overall Strength Comments  L shoulder overall strenght 4-/5    Right Hand Grip (lbs)  25    Left Hand Grip (lbs)  29      Ambulation/Gait   Ambulation/Gait  Yes    Assistive device  Rolling walker    Gait Pattern  Step-to pattern;Decreased stride length;Trendelenburg;Antalgic;Trunk flexed;Narrow base of support;Decreased trunk rotation    Gait Comments  pt ambulated 70 ft in 2 min before having to rest at lasting 3 min and was able to ambulate and additional 28ft       [facility] Pre-Surgical Assessment - 05/27/18 0001    5 Meter Walk Test- trial 1  13 sec    5 Meter Walk Test- trial 2  14 sec.     5 Meter Walk Test- trial 3  14 sec.    5 meter walk test average  13.67 sec    4 Stage Balance Test tolerated for:   8 sec.    4 Stage Balance Test Position  2    comment  in RW for safety    Comment  unable to perform     ADL/IADL Independent with:  Dressing;Bathing;Meal prep    ADL/IADL Needs Assistance with:  Finances;Yard work    ADL/IADL Freight forwarder Index  Vulnerable    6 Minute Walk- Baseline  yes    BP (mmHg)  150/80    HR (bpm)  71    02 Sat (%RA)  92 %    Modified Borg Scale for Dyspnea  0- Nothing at all    Perceived Rate of Exertion (Borg)  7- Very, very light    6 Minute Walk Post Test  yes    BP (mmHg)  (!) 182/93    HR (bpm)  113    02 Sat (%RA)  86 %    Modified Borg Scale for Dyspnea  2- Mild shortness of breath    Perceived Rate of Exertion (Borg)  13- Somewhat hard    Aerobic Endurance Distance Walked  107    Endurance additional comments  pt demonstrated 93.07% limited  compared to age related              Objective measurements completed on examination: See above findings.                           Plan - 05/27/18 1243    Clinical Impression  Statement  see assessment in note    Clinical Decision Making  Low    PT Frequency  One time visit    PT Next Visit Plan  Pre-TAVR evaluation    Consulted and Agree with Plan of Care  Patient       Clinical Impression Statement: Pt is a 78 yo F presenting to OP PT for evaluation prior to possible TAVR surgery due to severe aortic stenosis. Pt reports onset of SOB approximately 1 year ago. Symptoms are limiting standing/ walking endurance. Pt presents with functional ROM except for limited bil shoulder external and strength except for LUE weakness, limited balance and scored a high at high fall risk 4 stage balance test, limited walking speed and limited aerobic endurance per 6 minute walk test. Pt ambulated 70 feet in 2 min before requesting a seated rest beak lasting 3min. At time of rest, patient's HR was 113  bpm and O2 was 86% on room air. Pt reported 2/10 shortness of breath on modified scale for dyspnea. Pt able to resume after rest and ambulate an additional 37 feet. Pt ambulated a total of 107 feet in 6 minute walk. SOB and fatigue increased significantly with 6 minute walk test. Based on the Short Physical Performance Battery, patient has a frailty rating of 2/12 with </= 5/12 considered frail.   Patient demonstrated  the following deficits and impairments:     Visit Diagnosis: Muscle weakness (generalized)  Other abnormalities of gait and mobility     Problem List Patient Active Problem List   Diagnosis Date Noted  . Calculus of bile duct with acute cholecystitis and obstruction   . Obesity, Class III, BMI 40-49.9 (morbid obesity) (HCC)   . Junctional bradycardia   . Cholecystitis, acute with cholelithiasis 02/07/2018  . Calculus of gallbladder with biliary obstruction but without cholecystitis   . Preoperative cardiovascular examination   . Acute cholecystitis   . Severe aortic stenosis   . Choledocholithiasis   . N&V (nausea and vomiting) 02/06/2018  . Epigastric pain  02/06/2018   Lulu RidingKristoffer Kaye Mitro PT, DPT, LAT, ATC  05/27/18  12:49 PM      [facility] [address] [place], [place], [address] Phone: [phone]   Fax:  [phone]  Name: Dawn Gonzales MRN: [id number] Date of Birth: June 08, 1940

## 2018-05-29 ENCOUNTER — Other Ambulatory Visit: Payer: Self-pay

## 2018-05-29 DIAGNOSIS — I350 Nonrheumatic aortic (valve) stenosis: Secondary | ICD-10-CM | POA: Diagnosis not present

## 2018-05-29 DIAGNOSIS — G4730 Sleep apnea, unspecified: Secondary | ICD-10-CM | POA: Diagnosis not present

## 2018-05-29 DIAGNOSIS — G629 Polyneuropathy, unspecified: Secondary | ICD-10-CM | POA: Diagnosis not present

## 2018-05-29 DIAGNOSIS — Z792 Long term (current) use of antibiotics: Secondary | ICD-10-CM | POA: Diagnosis not present

## 2018-05-29 DIAGNOSIS — K8043 Calculus of bile duct with acute cholecystitis with obstruction: Secondary | ICD-10-CM | POA: Diagnosis not present

## 2018-05-29 DIAGNOSIS — Z6841 Body Mass Index (BMI) 40.0 and over, adult: Secondary | ICD-10-CM | POA: Diagnosis not present

## 2018-05-29 DIAGNOSIS — F329 Major depressive disorder, single episode, unspecified: Secondary | ICD-10-CM | POA: Diagnosis not present

## 2018-05-29 DIAGNOSIS — M6281 Muscle weakness (generalized): Secondary | ICD-10-CM | POA: Diagnosis not present

## 2018-05-29 MED ORDER — METOPROLOL TARTRATE 50 MG PO TABS: ORAL_TABLET | ORAL | 0 refills | Status: DC

## 2018-05-30 ENCOUNTER — Other Ambulatory Visit (HOSPITAL_COMMUNITY): Payer: Self-pay | Admitting: Physician Assistant

## 2018-05-30 ENCOUNTER — Ambulatory Visit (HOSPITAL_COMMUNITY)
Admission: RE | Admit: 2018-05-30 | Discharge: 2018-05-30 | Disposition: A | Discharge status: Home / Self Care | Payer: Medicare PPO | Source: Ambulatory Visit | Attending: Cardiovascular Disease | Admitting: Cardiovascular Disease

## 2018-05-30 ENCOUNTER — Encounter (HOSPITAL_COMMUNITY): Payer: Self-pay | Admitting: Physician Assistant

## 2018-05-30 ENCOUNTER — Ambulatory Visit (HOSPITAL_COMMUNITY)
Admission: RE | Admit: 2018-05-30 | Discharge: 2018-05-30 | Disposition: A | Discharge status: Home / Self Care | Payer: Medicare PPO | Source: Ambulatory Visit | Attending: Physician Assistant | Admitting: Physician Assistant

## 2018-05-30 DIAGNOSIS — I872 Venous insufficiency (chronic) (peripheral): Secondary | ICD-10-CM | POA: Diagnosis not present

## 2018-05-30 DIAGNOSIS — I350 Nonrheumatic aortic (valve) stenosis: Secondary | ICD-10-CM | POA: Diagnosis not present

## 2018-05-30 DIAGNOSIS — Z8679 Personal history of other diseases of the circulatory system: Secondary | ICD-10-CM | POA: Diagnosis not present

## 2018-05-30 DIAGNOSIS — K8020 Calculus of gallbladder without cholecystitis without obstruction: Secondary | ICD-10-CM | POA: Diagnosis not present

## 2018-05-30 DIAGNOSIS — E870 Hyperosmolality and hypernatremia: Secondary | ICD-10-CM | POA: Insufficient documentation

## 2018-05-30 DIAGNOSIS — Z006 Encounter for examination for normal comparison and control in clinical research program: Secondary | ICD-10-CM

## 2018-05-30 HISTORY — PX: IR US GUIDE VASC ACCESS RIGHT: IMG2390

## 2018-05-30 HISTORY — PX: IR RADIOLOGY PERIPHERAL GUIDED IV START: IMG5598

## 2018-05-30 MED ORDER — IOPAMIDOL (ISOVUE-370) INJECTION 76%
100.0000 mL | Freq: Once | INTRAVENOUS | Status: AC | PRN
  Administered 2018-05-30: 100 mL via INTRAVENOUS

## 2018-05-30 MED ORDER — LIDOCAINE HCL 1 % IJ SOLN
INTRAMUSCULAR | Status: AC
  Filled 2018-05-30: qty 20

## 2018-05-30 MED ORDER — LIDOCAINE HCL 1 % IJ SOLN
INTRAMUSCULAR | Status: DC | PRN
  Administered 2018-05-30: 10 mL

## 2018-05-30 NOTE — Research (Signed)
Cadfem Informed Consent    Patient Name: Dawn Gonzales   Subject met inclusion and exclusion criteria.  The informed consent form, study requirements and expectations were reviewed with the subject and questions and concerns were addressed prior to the signing of the consent form.  The subject verbalized understanding of the trail requirements.  The subject agreed to participate in the CADFEM trial and signed the informed consent.  The informed consent was obtained prior to performance of any protocol-specific procedures for the subject.  A copy of the signed informed consent was given to the subject and a copy was placed in the subject's medical record.   Neva Seat

## 2018-05-31 DIAGNOSIS — M6281 Muscle weakness (generalized): Secondary | ICD-10-CM | POA: Diagnosis not present

## 2018-05-31 DIAGNOSIS — Z792 Long term (current) use of antibiotics: Secondary | ICD-10-CM | POA: Diagnosis not present

## 2018-05-31 DIAGNOSIS — K8043 Calculus of bile duct with acute cholecystitis with obstruction: Secondary | ICD-10-CM | POA: Diagnosis not present

## 2018-05-31 DIAGNOSIS — I350 Nonrheumatic aortic (valve) stenosis: Secondary | ICD-10-CM | POA: Diagnosis not present

## 2018-05-31 DIAGNOSIS — G629 Polyneuropathy, unspecified: Secondary | ICD-10-CM | POA: Diagnosis not present

## 2018-05-31 DIAGNOSIS — G4730 Sleep apnea, unspecified: Secondary | ICD-10-CM | POA: Diagnosis not present

## 2018-05-31 DIAGNOSIS — Z6841 Body Mass Index (BMI) 40.0 and over, adult: Secondary | ICD-10-CM | POA: Diagnosis not present

## 2018-05-31 DIAGNOSIS — F329 Major depressive disorder, single episode, unspecified: Secondary | ICD-10-CM | POA: Diagnosis not present

## 2018-06-03 ENCOUNTER — Other Ambulatory Visit: Payer: Self-pay | Admitting: Cardiovascular Disease

## 2018-06-03 ENCOUNTER — Other Ambulatory Visit: Payer: Self-pay

## 2018-06-03 DIAGNOSIS — I350 Nonrheumatic aortic (valve) stenosis: Secondary | ICD-10-CM

## 2018-06-04 ENCOUNTER — Other Ambulatory Visit: Payer: Self-pay

## 2018-06-04 DIAGNOSIS — M6281 Muscle weakness (generalized): Secondary | ICD-10-CM | POA: Diagnosis not present

## 2018-06-04 MED ORDER — METOPROLOL TARTRATE 50 MG PO TABS: ORAL_TABLET | ORAL | 0 refills | Status: DC

## 2018-06-05 ENCOUNTER — Encounter (HOSPITAL_COMMUNITY): Payer: Self-pay | Admitting: Radiology

## 2018-06-05 ENCOUNTER — Ambulatory Visit (HOSPITAL_COMMUNITY)
Admission: RE | Admit: 2018-06-05 | Discharge: 2018-06-05 | Disposition: A | Discharge status: Home / Self Care | Payer: Medicare PPO | Source: Ambulatory Visit | Attending: Cardiovascular Disease | Admitting: Cardiovascular Disease

## 2018-06-05 DIAGNOSIS — Z6841 Body Mass Index (BMI) 40.0 and over, adult: Secondary | ICD-10-CM | POA: Diagnosis not present

## 2018-06-05 DIAGNOSIS — K8043 Calculus of bile duct with acute cholecystitis with obstruction: Secondary | ICD-10-CM | POA: Diagnosis not present

## 2018-06-05 DIAGNOSIS — G4730 Sleep apnea, unspecified: Secondary | ICD-10-CM | POA: Diagnosis not present

## 2018-06-05 DIAGNOSIS — M6281 Muscle weakness (generalized): Secondary | ICD-10-CM | POA: Diagnosis not present

## 2018-06-05 DIAGNOSIS — G629 Polyneuropathy, unspecified: Secondary | ICD-10-CM | POA: Diagnosis not present

## 2018-06-05 DIAGNOSIS — F329 Major depressive disorder, single episode, unspecified: Secondary | ICD-10-CM | POA: Diagnosis not present

## 2018-06-05 DIAGNOSIS — I350 Nonrheumatic aortic (valve) stenosis: Secondary | ICD-10-CM

## 2018-06-05 DIAGNOSIS — I872 Venous insufficiency (chronic) (peripheral): Secondary | ICD-10-CM | POA: Diagnosis not present

## 2018-06-05 DIAGNOSIS — Z792 Long term (current) use of antibiotics: Secondary | ICD-10-CM | POA: Diagnosis not present

## 2018-06-05 HISTORY — PX: IR US GUIDE VASC ACCESS RIGHT: IMG2390

## 2018-06-05 HISTORY — PX: IR RADIOLOGY PERIPHERAL GUIDED IV START: IMG5598

## 2018-06-05 MED ORDER — LIDOCAINE HCL (PF) 1 % IJ SOLN
INTRAMUSCULAR | Status: DC | PRN
  Administered 2018-06-05: 5 mL

## 2018-06-05 MED ORDER — IOPAMIDOL (ISOVUE-370) INJECTION 76%
100.0000 mL | Freq: Once | INTRAVENOUS | Status: AC | PRN
  Administered 2018-06-05: 100 mL via INTRAVENOUS

## 2018-06-05 MED ORDER — LIDOCAINE HCL 1 % IJ SOLN
INTRAMUSCULAR | Status: AC
  Filled 2018-06-05: qty 20

## 2018-06-05 NOTE — Procedures (Signed)
Successful US guided micropuncture IV catheter placement to (R) basilic vein. No complications. Aspirates and flushes easily, ready for use.  Brayton El PA-C Interventional Radiology 06/05/2018 1:53 PM

## 2018-06-06 ENCOUNTER — Encounter: Payer: Self-pay | Admitting: Thoracic Surgery (Cardiothoracic Vascular Surgery)

## 2018-06-07 ENCOUNTER — Other Ambulatory Visit: Payer: Self-pay | Admitting: Cardiovascular Disease

## 2018-06-07 ENCOUNTER — Encounter (HOSPITAL_COMMUNITY): Payer: Self-pay | Admitting: Radiology

## 2018-06-07 DIAGNOSIS — F329 Major depressive disorder, single episode, unspecified: Secondary | ICD-10-CM | POA: Diagnosis not present

## 2018-06-07 DIAGNOSIS — K8043 Calculus of bile duct with acute cholecystitis with obstruction: Secondary | ICD-10-CM | POA: Diagnosis not present

## 2018-06-07 DIAGNOSIS — M6281 Muscle weakness (generalized): Secondary | ICD-10-CM | POA: Diagnosis not present

## 2018-06-07 DIAGNOSIS — G4730 Sleep apnea, unspecified: Secondary | ICD-10-CM | POA: Diagnosis not present

## 2018-06-07 DIAGNOSIS — Z792 Long term (current) use of antibiotics: Secondary | ICD-10-CM | POA: Diagnosis not present

## 2018-06-07 DIAGNOSIS — I350 Nonrheumatic aortic (valve) stenosis: Secondary | ICD-10-CM

## 2018-06-07 DIAGNOSIS — G629 Polyneuropathy, unspecified: Secondary | ICD-10-CM | POA: Diagnosis not present

## 2018-06-07 DIAGNOSIS — Z6841 Body Mass Index (BMI) 40.0 and over, adult: Secondary | ICD-10-CM | POA: Diagnosis not present

## 2018-06-10 ENCOUNTER — Encounter (HOSPITAL_COMMUNITY): Payer: Self-pay | Admitting: Radiology

## 2018-06-12 DIAGNOSIS — Z792 Long term (current) use of antibiotics: Secondary | ICD-10-CM | POA: Diagnosis not present

## 2018-06-12 DIAGNOSIS — I350 Nonrheumatic aortic (valve) stenosis: Secondary | ICD-10-CM | POA: Diagnosis not present

## 2018-06-12 DIAGNOSIS — Z6841 Body Mass Index (BMI) 40.0 and over, adult: Secondary | ICD-10-CM | POA: Diagnosis not present

## 2018-06-12 DIAGNOSIS — G629 Polyneuropathy, unspecified: Secondary | ICD-10-CM | POA: Diagnosis not present

## 2018-06-12 DIAGNOSIS — G4730 Sleep apnea, unspecified: Secondary | ICD-10-CM | POA: Diagnosis not present

## 2018-06-12 DIAGNOSIS — K8043 Calculus of bile duct with acute cholecystitis with obstruction: Secondary | ICD-10-CM | POA: Diagnosis not present

## 2018-06-12 DIAGNOSIS — F329 Major depressive disorder, single episode, unspecified: Secondary | ICD-10-CM | POA: Diagnosis not present

## 2018-06-12 DIAGNOSIS — M6281 Muscle weakness (generalized): Secondary | ICD-10-CM | POA: Diagnosis not present

## 2018-06-14 DIAGNOSIS — G629 Polyneuropathy, unspecified: Secondary | ICD-10-CM | POA: Diagnosis not present

## 2018-06-14 DIAGNOSIS — I350 Nonrheumatic aortic (valve) stenosis: Secondary | ICD-10-CM | POA: Diagnosis not present

## 2018-06-14 DIAGNOSIS — K8043 Calculus of bile duct with acute cholecystitis with obstruction: Secondary | ICD-10-CM | POA: Diagnosis not present

## 2018-06-14 DIAGNOSIS — F329 Major depressive disorder, single episode, unspecified: Secondary | ICD-10-CM | POA: Diagnosis not present

## 2018-06-14 DIAGNOSIS — Z6841 Body Mass Index (BMI) 40.0 and over, adult: Secondary | ICD-10-CM | POA: Diagnosis not present

## 2018-06-14 DIAGNOSIS — G4730 Sleep apnea, unspecified: Secondary | ICD-10-CM | POA: Diagnosis not present

## 2018-06-14 DIAGNOSIS — Z792 Long term (current) use of antibiotics: Secondary | ICD-10-CM | POA: Diagnosis not present

## 2018-06-14 DIAGNOSIS — M6281 Muscle weakness (generalized): Secondary | ICD-10-CM | POA: Diagnosis not present

## 2018-06-17 ENCOUNTER — Encounter (HOSPITAL_COMMUNITY): Payer: Self-pay

## 2018-06-17 ENCOUNTER — Encounter: Payer: Self-pay | Admitting: Thoracic Surgery (Cardiothoracic Vascular Surgery)

## 2018-06-17 MED ORDER — DEXTROSE 5 % IV SOLN
3.0000 g | INTRAVENOUS | Status: AC
  Administered 2018-06-18: 3 g via INTRAVENOUS
  Filled 2018-06-17: qty 3

## 2018-06-17 NOTE — Anesthesia Preprocedure Evaluation (Addendum)
Anesthesia Evaluation  Patient identified by MRN, date of birth, ID band Patient awake    Reviewed: Allergy & Precautions, NPO status , Patient's Chart, lab work & pertinent test results  History of Anesthesia Complications Negative for: history of anesthetic complications  Airway Mallampati: III       Dental  (+) Dental Advisory Given, Poor Dentition, Missing, Loose   Pulmonary former smoker,    Pulmonary exam normal breath sounds clear to auscultation       Cardiovascular hypertension, Pt. on home beta blockers + Valvular Problems/Murmurs AS  Rhythm:Regular Rate:Normal + Systolic murmurs  Hx endocarditis  '19 TTE - Severe LVH. EF 55% to 60%. Grade 1 diastolic   Dysfunction. Severe AS. Mean gradient (S): 51 mm Hg. Mild mitral stenosis.  Moderately dilated LA. There was increased thickness of the atrial septum, consistent with lipomatous hypertrophy.    Neuro/Psych PSYCHIATRIC DISORDERS Depression negative neurological ROS     GI/Hepatic negative GI ROS, Neg liver ROS,   Endo/Other  Morbid obesity  Renal/GU negative Renal ROS  negative genitourinary   Musculoskeletal negative musculoskeletal ROS (+)   Abdominal (+) + obese,   Peds  Hematology  (+) anemia ,   Anesthesia Other Findings Result Notes for ECHOCARDIOGRAM COMPLETE   Notes recorded by Nori Riis, RN on 02/04/2018 at 12:38 PM EDT Pt notified copied pcp ------  Notes recorded by Jonelle Sidle, MD on 02/04/2018 at 8:16 AM EDT Results reviewed. LVEF normal at 55 to 60% with mild diastolic dysfunction. She does have severe aortic stenosis which is what we expected based on discussion at recent office visit. Await records from prior cardiology evaluation and keep office follow-up. A copy of this test should be forwarded to Benita Stabile, MD.   Study Result   Result status: Final result                     *[facility]*                         [address]                        [place], [place] [address]                            [phone]  ------------------------------------------------------------------- Transthoracic Echocardiography  Patient:    Dawn Gonzales, Dawn Gonzales MR #:       [id number] Study Date: 02/01/2018 Gender:     F Age:        78 Height:     167.6 cm Weight:     124.3 kg BSA:        2.47 m^2 Pt. Status: Room:   Lindi Adie, M.D.  REFERRING    Nona Dell, M.D.  REFERRING    Catalina Pizza  ATTENDING    Margo Aye, [employer]  SONOGRAPHER  Jeryl Columbia  PERFORMING   [facility], Jeani Hawking  cc:  ------------------------------------------------------------------- LV EF: 55% -   60%  ------------------------------------------------------------------- Indications:      Murmur 785.2.  ------------------------------------------------------------------- History:   PMH:   Murmur.  Bacteremia.  Risk factors: Dyslipidemia.  ------------------------------------------------------------------- Study Conclusions  - Left ventricle: The cavity size was normal. Wall thickness was   increased in a pattern of severe LVH. Systolic function was   normal. The estimated  ejection fraction was in the range of 55%   to 60%. Wall motion was normal; there were no regional wall   motion abnormalities. Doppler parameters are consistent with   abnormal left ventricular relaxation (grade 1 diastolic   dysfunction). Doppler parameters are consistent with high   ventricular filling pressure. - Aortic valve: Severely calcified annulus. Trileaflet; severely   thickened leaflets. There was severe stenosis. Mean gradient (S):   51 mm Hg. VTI ratio of LVOT to aortic valve: 0.18. Valve area   (VTI): 0.67 cm^2. Valve area (Vmax): 0.76 cm^2. Valve area   (Vmean): 0.67 cm^2. - Mitral valve: Severely calcified annulus. The findings are   consistent with mild stenosis.  Mean gradient (D): 3 mm Hg. Valve   area by pressure half-time: 2.18 cm^2. Valve area by continuity   equation (using LVOT flow): 1.8 cm^2. - Left atrium: The atrium was moderately dilated. - Atrial septum: There was increased thickness of the septum,   consistent with lipomatous hypertrophy. No defect or patent   foramen ovale was identified.  ------------------------------------------------------------------- Study data:   Study status:  Routine.  Procedure:  Transthoracic echocardiography. Image quality was adequate.  Study completion: There were no complications    Reproductive/Obstetrics                            Anesthesia Physical  Anesthesia Plan  ASA: III  Anesthesia Plan: General   Post-op Pain Management:    Induction: Intravenous  PONV Risk Score and Plan: 3 and Treatment may vary due to age or medical condition and Ondansetron  Airway Management Planned: Oral ETT  Additional Equipment: Arterial line  Intra-op Plan:   Post-operative Plan: Extubation in OR  Informed Consent: I have reviewed the patients History and Physical, chart, labs and discussed the procedure including the risks, benefits and alternatives for the proposed anesthesia with the patient or authorized representative who has indicated his/her understanding and acceptance.     Dental advisory given  Plan Discussed with: CRNA  Anesthesia Plan Comments:        Anesthesia Quick Evaluation

## 2018-06-17 NOTE — H&P (Signed)
HISTORY AND PHYSICAL  Dawn Gonzales is a 78 y.o. female patient pre-op for cardiac surgery requires full mouth extractions.   No diagnosis found.  Past Medical History:  Diagnosis Date  . Aortic stenosis   . Cholecystitis   . Chronic diastolic CHF (congestive heart failure), NYHA class 2 (HCC)   . Depression   . History of cellulitis   . History of endocarditis   . Hyperlipemia   . Morbid obesity (HCC)   . Neuropathy   . TIA (transient ischemic attack)   . Tobacco abuse     No current facility-administered medications for this encounter.    Current Outpatient Medications  Medication Sig Dispense Refill  . acetaminophen (TYLENOL) 650 MG CR tablet Take 1,300 mg by mouth every 8 (eight) hours as needed for pain.    . Calcium Carbonate (CALCIUM-CARB 600 PO) Take 600 mg by mouth daily.    . cholecalciferol (VITAMIN D) 1000 units tablet Take 1,000 Units by mouth daily.    . ferrous sulfate 325 (65 FE) MG tablet Take 325 mg by mouth daily with breakfast.    . gabapentin (NEURONTIN) 300 MG capsule Take 300 mg by mouth 3 (three) times daily.    . Multiple Vitamin (MULTIVITAMIN WITH MINERALS) TABS tablet Take 1 tablet by mouth daily.    . niacin 500 MG tablet Take 500 mg by mouth daily.    Marland Kitchen nystatin ointment (MYCOSTATIN) Apply 1 application topically 2 (two) times daily as needed (rash).   2  . pravastatin (PRAVACHOL) 20 MG tablet Take 20 mg by mouth at bedtime.     . traMADol (ULTRAM) 50 MG tablet Take 50 mg by mouth every 6 (six) hours as needed for moderate pain.    Marland Kitchen venlafaxine (EFFEXOR) 37.5 MG tablet Take 37.5 mg by mouth daily.    . vitamin C (ASCORBIC ACID) 500 MG tablet Take 500 mg by mouth daily.    Marland Kitchen ZINC OXIDE EX Apply 1 application topically daily as needed (wound care).    Marland Kitchen acetaminophen (TYLENOL) 325 MG tablet Take 2 tablets (650 mg total) by mouth every 6 (six) hours as needed for mild pain (or Fever >/= 101). (Patient not taking: Reported on 06/12/2018) 20 tablet  0  . aspirin EC 81 MG tablet Take 81 mg by mouth daily.    . metoprolol tartrate (LOPRESSOR) 50 MG tablet Take one tablet by mouth one hour prior to TAVR CT scan on 06/05/2018, take on arrival to the hospital with a few sips of water (Patient not taking: Reported on 06/12/2018) 1 tablet 0   No Known Allergies Active Problems:   * No active hospital problems. *  Vitals: There were no vitals taken for this visit. Lab results:No results found for this or any previous visit (from the past 24 hour(s)). Radiology Results: No results found. General appearance: alert, cooperative and morbidly obese Head: Normocephalic, without obvious abnormality, atraumatic Eyes: negative Nose: Nares normal. Septum midline. Mucosa normal. No drainage or sinus tenderness. Throat: multiple carious teeth, pharynx clear. no trismus. Neck: no adenopathy, supple, symmetrical, trachea midline and thyroid not enlarged, symmetric, no tenderness/mass/nodules Resp: diminished breath sounds bilaterally  Cardio: Systolic murmur  Assessment: Multiple nonrestorable teeth x 16 secondary to dental caries  Plan: Full mouth extractions with alveoloplasty. GA. Day surgery.    Ocie Doyne 06/17/2018

## 2018-06-18 ENCOUNTER — Other Ambulatory Visit: Payer: Self-pay

## 2018-06-18 ENCOUNTER — Ambulatory Visit (HOSPITAL_COMMUNITY)
Admission: RE | Admit: 2018-06-18 | Discharge: 2018-06-18 | Disposition: A | Discharge status: Home / Self Care | Payer: Medicare PPO | Attending: Oral Surgery | Admitting: Oral Surgery

## 2018-06-18 ENCOUNTER — Ambulatory Visit (HOSPITAL_COMMUNITY): Payer: Medicare PPO | Admitting: Certified Registered Nurse Anesthetist

## 2018-06-18 ENCOUNTER — Encounter (HOSPITAL_COMMUNITY)
Admission: RE | Disposition: A | Discharge status: Home / Self Care | Payer: Self-pay | Source: Home / Self Care | Attending: Oral Surgery

## 2018-06-18 ENCOUNTER — Encounter (HOSPITAL_COMMUNITY): Payer: Self-pay

## 2018-06-18 DIAGNOSIS — E785 Hyperlipidemia, unspecified: Secondary | ICD-10-CM | POA: Insufficient documentation

## 2018-06-18 DIAGNOSIS — Z79899 Other long term (current) drug therapy: Secondary | ICD-10-CM | POA: Insufficient documentation

## 2018-06-18 DIAGNOSIS — I5032 Chronic diastolic (congestive) heart failure: Secondary | ICD-10-CM | POA: Diagnosis not present

## 2018-06-18 DIAGNOSIS — Z87891 Personal history of nicotine dependence: Secondary | ICD-10-CM | POA: Insufficient documentation

## 2018-06-18 DIAGNOSIS — F329 Major depressive disorder, single episode, unspecified: Secondary | ICD-10-CM | POA: Diagnosis not present

## 2018-06-18 DIAGNOSIS — K056 Periodontal disease, unspecified: Secondary | ICD-10-CM | POA: Diagnosis not present

## 2018-06-18 DIAGNOSIS — I110 Hypertensive heart disease with heart failure: Secondary | ICD-10-CM | POA: Diagnosis not present

## 2018-06-18 DIAGNOSIS — Z6841 Body Mass Index (BMI) 40.0 and over, adult: Secondary | ICD-10-CM | POA: Diagnosis not present

## 2018-06-18 DIAGNOSIS — G629 Polyneuropathy, unspecified: Secondary | ICD-10-CM | POA: Diagnosis not present

## 2018-06-18 DIAGNOSIS — Z8673 Personal history of transient ischemic attack (TIA), and cerebral infarction without residual deficits: Secondary | ICD-10-CM | POA: Diagnosis not present

## 2018-06-18 DIAGNOSIS — Z7982 Long term (current) use of aspirin: Secondary | ICD-10-CM | POA: Insufficient documentation

## 2018-06-18 DIAGNOSIS — K029 Dental caries, unspecified: Secondary | ICD-10-CM | POA: Insufficient documentation

## 2018-06-18 HISTORY — DX: Adverse effect of unspecified anesthetic, initial encounter: T41.45XA

## 2018-06-18 HISTORY — PX: TOOTH EXTRACTION: SHX859

## 2018-06-18 HISTORY — DX: Anxiety disorder, unspecified: F41.9

## 2018-06-18 HISTORY — DX: Essential (primary) hypertension: I10

## 2018-06-18 HISTORY — DX: Sleep apnea, unspecified: G47.30

## 2018-06-18 HISTORY — DX: Unspecified osteoarthritis, unspecified site: M19.90

## 2018-06-18 HISTORY — DX: Anemia, unspecified: D64.9

## 2018-06-18 LAB — CBC WITH DIFFERENTIAL/PLATELET
Abs Immature Granulocytes: 0.05 10*3/uL (ref 0.00–0.07)
Basophils Absolute: 0.1 10*3/uL (ref 0.0–0.1)
Basophils Relative: 1 %
Eosinophils Absolute: 0.4 10*3/uL (ref 0.0–0.5)
Eosinophils Relative: 6 %
HCT: 40.1 % (ref 36.0–46.0)
Hemoglobin: 12.2 g/dL (ref 12.0–15.0)
Immature Granulocytes: 1 %
Lymphocytes Relative: 23 %
Lymphs Abs: 1.7 10*3/uL (ref 0.7–4.0)
MCH: 28.5 pg (ref 26.0–34.0)
MCHC: 30.4 g/dL (ref 30.0–36.0)
MCV: 93.7 fL (ref 80.0–100.0)
Monocytes Absolute: 0.7 10*3/uL (ref 0.1–1.0)
Monocytes Relative: 10 %
Neutro Abs: 4.3 10*3/uL (ref 1.7–7.7)
Neutrophils Relative %: 59 %
Platelets: 239 10*3/uL (ref 150–400)
RBC: 4.28 MIL/uL (ref 3.87–5.11)
RDW: 15.1 % (ref 11.5–15.5)
WBC: 7.3 10*3/uL (ref 4.0–10.5)
nRBC: 0 % (ref 0.0–0.2)

## 2018-06-18 LAB — BASIC METABOLIC PANEL
Anion gap: 8 (ref 5–15)
BUN: 24 mg/dL — ABNORMAL HIGH (ref 8–23)
CO2: 25 mmol/L (ref 22–32)
Calcium: 9.3 mg/dL (ref 8.9–10.3)
Chloride: 105 mmol/L (ref 98–111)
Creatinine, Ser: 0.72 mg/dL (ref 0.44–1.00)
GFR calc Af Amer: >60 mL/min (ref >60)
GFR calc non Af Amer: >60 mL/min (ref >60)
Glucose, Bld: 100 mg/dL — ABNORMAL HIGH (ref 70–99)
Potassium: 3.8 mmol/L (ref 3.5–5.1)
Sodium: 138 mmol/L (ref 135–145)

## 2018-06-18 LAB — SURGICAL PCR SCREEN
MRSA, PCR: NEGATIVE
Staphylococcus aureus: POSITIVE — AB

## 2018-06-18 SURGERY — DENTAL RESTORATION/EXTRACTIONS
Anesthesia: General | Site: Mouth

## 2018-06-18 MED ORDER — EPHEDRINE SULFATE-NACL 50-0.9 MG/10ML-% IV SOSY
PREFILLED_SYRINGE | INTRAVENOUS | Status: DC | PRN
  Administered 2018-06-18 (×3): 10 mg via INTRAVENOUS

## 2018-06-18 MED ORDER — LIDOCAINE 2% (20 MG/ML) 5 ML SYRINGE
INTRAMUSCULAR | Status: AC
  Filled 2018-06-18: qty 5

## 2018-06-18 MED ORDER — EPHEDRINE 5 MG/ML INJ
INTRAVENOUS | Status: AC
  Filled 2018-06-18: qty 10

## 2018-06-18 MED ORDER — OXYMETAZOLINE HCL 0.05 % NA SOLN
NASAL | Status: AC
  Filled 2018-06-18: qty 30

## 2018-06-18 MED ORDER — LIDOCAINE 2% (20 MG/ML) 5 ML SYRINGE
INTRAMUSCULAR | Status: DC | PRN
  Administered 2018-06-18: 80 mg via INTRAVENOUS

## 2018-06-18 MED ORDER — LACTATED RINGERS IV SOLN
INTRAVENOUS | Status: DC | PRN
  Administered 2018-06-18: 08:00:00 via INTRAVENOUS

## 2018-06-18 MED ORDER — DEXAMETHASONE SODIUM PHOSPHATE 10 MG/ML IJ SOLN
INTRAMUSCULAR | Status: DC | PRN
  Administered 2018-06-18: 10 mg via INTRAVENOUS

## 2018-06-18 MED ORDER — LIDOCAINE-EPINEPHRINE 2 %-1:100000 IJ SOLN
INTRAMUSCULAR | Status: AC
  Filled 2018-06-18: qty 1

## 2018-06-18 MED ORDER — SUCCINYLCHOLINE CHLORIDE 200 MG/10ML IV SOSY
PREFILLED_SYRINGE | INTRAVENOUS | Status: AC
  Filled 2018-06-18: qty 10

## 2018-06-18 MED ORDER — MIDAZOLAM HCL 2 MG/2ML IJ SOLN
INTRAMUSCULAR | Status: AC
  Filled 2018-06-18: qty 2

## 2018-06-18 MED ORDER — SUGAMMADEX SODIUM 200 MG/2ML IV SOLN
INTRAVENOUS | Status: DC | PRN
  Administered 2018-06-18: 250 mg via INTRAVENOUS

## 2018-06-18 MED ORDER — PROPOFOL 10 MG/ML IV BOLUS
INTRAVENOUS | Status: AC
  Filled 2018-06-18: qty 20

## 2018-06-18 MED ORDER — OXYCODONE HCL 5 MG PO TABS: 5.0000 mg | ORAL_TABLET | ORAL | 0 refills | Status: DC | PRN

## 2018-06-18 MED ORDER — PROPOFOL 10 MG/ML IV BOLUS
INTRAVENOUS | Status: DC | PRN
  Administered 2018-06-18: 150 mg via INTRAVENOUS
  Administered 2018-06-18 (×2): 50 mg via INTRAVENOUS

## 2018-06-18 MED ORDER — ROCURONIUM BROMIDE 50 MG/5ML IV SOSY
PREFILLED_SYRINGE | INTRAVENOUS | Status: AC
  Filled 2018-06-18: qty 5

## 2018-06-18 MED ORDER — FENTANYL CITRATE (PF) 250 MCG/5ML IJ SOLN
INTRAMUSCULAR | Status: AC
  Filled 2018-06-18: qty 5

## 2018-06-18 MED ORDER — FENTANYL CITRATE (PF) 250 MCG/5ML IJ SOLN
INTRAMUSCULAR | Status: DC | PRN
  Administered 2018-06-18: 100 ug via INTRAVENOUS
  Administered 2018-06-18: 50 ug via INTRAVENOUS

## 2018-06-18 MED ORDER — PHENYLEPHRINE 40 MCG/ML (10ML) SYRINGE FOR IV PUSH (FOR BLOOD PRESSURE SUPPORT)
PREFILLED_SYRINGE | INTRAVENOUS | Status: AC
  Filled 2018-06-18: qty 10

## 2018-06-18 MED ORDER — PHENYLEPHRINE 40 MCG/ML (10ML) SYRINGE FOR IV PUSH (FOR BLOOD PRESSURE SUPPORT)
PREFILLED_SYRINGE | INTRAVENOUS | Status: DC | PRN
  Administered 2018-06-18: 80 ug via INTRAVENOUS

## 2018-06-18 MED ORDER — MIDAZOLAM HCL 2 MG/2ML IJ SOLN
INTRAMUSCULAR | Status: DC | PRN
  Administered 2018-06-18: 0.5 mg via INTRAVENOUS

## 2018-06-18 MED ORDER — ALBUTEROL SULFATE HFA 108 (90 BASE) MCG/ACT IN AERS
INHALATION_SPRAY | RESPIRATORY_TRACT | Status: AC
  Filled 2018-06-18: qty 6.7

## 2018-06-18 MED ORDER — ONDANSETRON HCL 4 MG/2ML IJ SOLN
INTRAMUSCULAR | Status: AC
  Filled 2018-06-18: qty 2

## 2018-06-18 MED ORDER — ROCURONIUM BROMIDE 10 MG/ML (PF) SYRINGE
PREFILLED_SYRINGE | INTRAVENOUS | Status: DC | PRN
  Administered 2018-06-18: 50 mg via INTRAVENOUS

## 2018-06-18 MED ORDER — MUPIROCIN 2 % EX OINT
1.0000 "application " | TOPICAL_OINTMENT | Freq: Once | CUTANEOUS | Status: AC
  Administered 2018-06-18: 1 via TOPICAL
  Filled 2018-06-18: qty 22

## 2018-06-18 MED ORDER — 0.9 % SODIUM CHLORIDE (POUR BTL) OPTIME
TOPICAL | Status: DC | PRN
  Administered 2018-06-18: 1000 mL

## 2018-06-18 MED ORDER — ONDANSETRON HCL 4 MG/2ML IJ SOLN
INTRAMUSCULAR | Status: DC | PRN
  Administered 2018-06-18: 4 mg via INTRAVENOUS

## 2018-06-18 MED ORDER — OXYMETAZOLINE HCL 0.05 % NA SOLN
NASAL | Status: DC | PRN
  Administered 2018-06-18: 1 via TOPICAL

## 2018-06-18 MED ORDER — DEXAMETHASONE SODIUM PHOSPHATE 10 MG/ML IJ SOLN
INTRAMUSCULAR | Status: AC
  Filled 2018-06-18: qty 1

## 2018-06-18 MED ORDER — BUPIVACAINE-EPINEPHRINE (PF) 0.25% -1:200000 IJ SOLN
INTRAMUSCULAR | Status: AC
  Filled 2018-06-18: qty 30

## 2018-06-18 SURGICAL SUPPLY — 40 items
BLADE SURG 15 STRL LF DISP TIS (BLADE) ×1 IMPLANT
BLADE SURG 15 STRL SS (BLADE) ×2
BUR CROSS CUT FISSURE 1.6 (BURR) ×2 IMPLANT
BUR CROSS CUT FISSURE 1.6MM (BURR) ×1
BUR EGG ELITE 4.0 (BURR) ×2 IMPLANT
BUR EGG ELITE 4.0MM (BURR) ×1
CANISTER SUCT 3000ML PPV (MISCELLANEOUS) ×3 IMPLANT
COVER SURGICAL LIGHT HANDLE (MISCELLANEOUS) ×3 IMPLANT
COVER WAND RF STERILE (DRAPES) ×3 IMPLANT
DECANTER SPIKE VIAL GLASS SM (MISCELLANEOUS) ×3 IMPLANT
DRAPE U-SHAPE 76X120 STRL (DRAPES) ×3 IMPLANT
GAUZE PACKING FOLDED 2  STR (GAUZE/BANDAGES/DRESSINGS) ×2
GAUZE PACKING FOLDED 2 STR (GAUZE/BANDAGES/DRESSINGS) ×1 IMPLANT
GLOVE BIO SURGEON STRL SZ 6.5 (GLOVE) IMPLANT
GLOVE BIO SURGEON STRL SZ7 (GLOVE) IMPLANT
GLOVE BIO SURGEON STRL SZ7.5 (GLOVE) ×3 IMPLANT
GLOVE BIO SURGEONS STRL SZ 6.5 (GLOVE)
GLOVE BIOGEL PI IND STRL 6.5 (GLOVE) IMPLANT
GLOVE BIOGEL PI IND STRL 7.0 (GLOVE) IMPLANT
GLOVE BIOGEL PI INDICATOR 6.5 (GLOVE)
GLOVE BIOGEL PI INDICATOR 7.0 (GLOVE)
GOWN STRL REUS W/ TWL LRG LVL3 (GOWN DISPOSABLE) ×1 IMPLANT
GOWN STRL REUS W/ TWL XL LVL3 (GOWN DISPOSABLE) ×1 IMPLANT
GOWN STRL REUS W/TWL LRG LVL3 (GOWN DISPOSABLE) ×2
GOWN STRL REUS W/TWL XL LVL3 (GOWN DISPOSABLE) ×2
IV NS 1000ML (IV SOLUTION) ×2
IV NS 1000ML BAXH (IV SOLUTION) ×1 IMPLANT
KIT BASIN OR (CUSTOM PROCEDURE TRAY) ×3 IMPLANT
KIT TURNOVER KIT B (KITS) ×3 IMPLANT
NEEDLE 22X1 1/2 (OR ONLY) (NEEDLE) ×6 IMPLANT
NS IRRIG 1000ML POUR BTL (IV SOLUTION) ×3 IMPLANT
PAD ARMBOARD 7.5X6 YLW CONV (MISCELLANEOUS) ×3 IMPLANT
SLEEVE IRRIGATION ELITE 7 (MISCELLANEOUS) ×5 IMPLANT
SPONGE SURGIFOAM ABS GEL 12-7 (HEMOSTASIS) IMPLANT
SPONGE SURGIFOAM ABS GEL SZ50 (HEMOSTASIS) ×2 IMPLANT
SUT CHROMIC 3 0 PS 2 (SUTURE) ×3 IMPLANT
SYR CONTROL 10ML LL (SYRINGE) ×3 IMPLANT
TRAY ENT MC OR (CUSTOM PROCEDURE TRAY) ×3 IMPLANT
TUBING IRRIGATION (MISCELLANEOUS) ×3 IMPLANT
YANKAUER SUCT BULB TIP NO VENT (SUCTIONS) ×3 IMPLANT

## 2018-06-18 NOTE — Anesthesia Procedure Notes (Signed)
Procedure Name: Intubation Date/Time: 06/18/2018 7:54 AM Performed by: Gwyndolyn Saxon, CRNA Pre-anesthesia Checklist: Patient identified, Emergency Drugs available, Suction available and Patient being monitored Patient Re-evaluated:Patient Re-evaluated prior to induction Oxygen Delivery Method: Circle system utilized Preoxygenation: Pre-oxygenation with 100% oxygen Induction Type: IV induction Ventilation: Mask ventilation without difficulty Laryngoscope Size: Mac and 3 Grade View: Grade III Tube type: Oral Rae Tube size: 7.0 mm Number of attempts: 1 Airway Equipment and Method: Patient positioned with wedge pillow Placement Confirmation: ETT inserted through vocal cords under direct vision,  positive ETCO2 and breath sounds checked- equal and bilateral Tube secured with: Tape Dental Injury: Teeth and Oropharynx as per pre-operative assessment

## 2018-06-18 NOTE — Anesthesia Postprocedure Evaluation (Signed)
Anesthesia Post Note  Patient: Dawn Gonzales  Procedure(s) Performed: DENTAL RESTORATION/EXTRACTIONS (N/A Mouth)     Patient location during evaluation: PACU Anesthesia Type: General Level of consciousness: awake Pain management: pain level controlled Vital Signs Assessment: post-procedure vital signs reviewed and stable Respiratory status: spontaneous breathing Cardiovascular status: stable Postop Assessment: no apparent nausea or vomiting Anesthetic complications: no    Last Vitals:  Vitals:   06/18/18 1019 06/18/18 1020  BP: 124/62   Pulse: 82 85  Resp: 15 19  Temp:  36.6 C  SpO2: 96% 94%    Last Pain:  Vitals:   06/18/18 1020  TempSrc:   PainSc: 0-No pain   Pain Goal: Patients Stated Pain Goal: 0 (06/18/18 0601)                 Caren Macadam

## 2018-06-18 NOTE — Anesthesia Procedure Notes (Signed)
Arterial Line Insertion Start/End2/18/2020 7:20 AM, 06/18/2018 7:35 AM Performed by: Leilani Able, MD, Zollie Scale, CRNA, CRNA  Patient location: Pre-op. Preanesthetic checklist: patient identified, IV checked, site marked, risks and benefits discussed, surgical consent, monitors and equipment checked, pre-op evaluation, timeout performed and anesthesia consent Lidocaine 1% used for infiltration Right, radial was placed Catheter size: 20 G Hand hygiene performed , maximum sterile barriers used  and Seldinger technique used Allen's test indicative of satisfactory collateral circulation Attempts: 2 Procedure performed using ultrasound guided technique. Ultrasound Notes:anatomy identified, needle tip was noted to be adjacent to the nerve/plexus identified and no ultrasound evidence of intravascular and/or intraneural injection Following insertion, dressing applied and Biopatch. Post procedure assessment: normal  Post procedure complications: local hematoma (on left arm that was attempted first by [employer], CRNA). Patient tolerated the procedure well with no immediate complications.

## 2018-06-18 NOTE — H&P (Signed)
H&P documentation  -History and Physical Reviewed  -Patient has been re-examined  -No change in the plan of care  Dawn Gonzales  

## 2018-06-18 NOTE — Op Note (Signed)
NAMENACY, BATH MEDICAL RECORD [id number] ACCOUNT [id number] DATE OF BIRTH:[birth year] FACILITY: [facility] LOCATION: [facility] PHYSICIAN:Crissie Aloi M. 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  PROCEDURE: 1.  Extraction of teeth numbers 1, 2, 3, 4, 8, 10, 11, 13, 15, 17, 20, 21, 27, 28, 29, 32. 2.  Alveoplasty right and left maxilla and mandible.  SURGEON:  Ocie Doyne, DDS  ANESTHESIA:  General nasal intubation, Dr. Arby Barrette attending.  DESCRIPTION OF PROCEDURE:  The patient was taken to the operating room and placed on the table in supine position.  General anesthesia was administered intravenously, and oral endotracheal tube was placed and marked.  The eyes were protected.  The  patient was draped for surgery.  A timeout was performed.  The posterior pharynx was suctioned, and a throat pack was placed.  Lidocaine 2% 1:100,000 epinephrine was infiltrated in the inferior alveolar block on the right and left sides and then buccal  and palatal infiltration around the maxillary teeth.  A bite block was placed on the right side of the mouth.  A sweetheart retractor was used to retract the tongue.  A #15 blade was used to make an incision beginning at tooth #17, carried forward on the  alveolar crest to tooth numbers 20 and 21, which were encircled in the gingival sulcus, and then the periosteum was reflected from around these teeth.  The teeth were elevated and removed with the dental forceps.  The sockets were curetted, irrigated.   The periosteum was reflected to expose the alveolar crest, and then alveoplasty was performed using an egg-shaped  bur and bone file.  Then, the areas were irrigated.  Gelfoam was packed into the sockets, and this area was closed with 3-0 chromic.  On the  left maxilla, a 15 blade was used to make an incision around the roots of teeth numbers 15, 13, 11, 10 and 8.  The periosteum was reflected, and the teeth were elevated and removed with the dental forceps.  Tooth #11 fractured upon attempted removal,  requiring removal of additional bone around the root before it could be elevated and removed with the forceps.  Then, the periosteum was reflected to expose the alveolar crest.  Alveoplasty was performed using an egg-shaped bur and then followed by a  bone file.  Then, the area was irrigated and Gelfoam was placed into each of the sockets and the area was closed with 3-0 chromic.  Then, the endotracheal tube was repositioned to the other side of the mouth as well as the bite block and sweetheart  retractor, and a 15 blade was used to make an incision around teeth numbers 27, 28, 29 and 32.  The periosteum was reflected.  The teeth were elevated with a 301 elevator and removed from the mouth with the dental forceps.  Then, the periosteum was  reflected to expose the alveolar crest, which was irregular, and alveoplasty was performed with the egg-shaped bur and bone file.  Then, Gelfoam was placed into the sockets of the 4 teeth, and they were sutured with 3-0 chromic.  In the maxilla, a 15  blade was used to make  an incision around the roots of 1, 2, 3, 4.  The periosteum was reflected from around these.  The teeth were elevated and removed with rongeurs and a 301 elevator.  Then, the periosteum was reflected to expose the alveolar crest.   Alveoplasty was performed using an bur and bone file.  Then, the area was irrigated and Gelfoam was placed.  Sockets in the area were closed with 3-0 chromic.  Then, the oral cavity was irrigated and suctioned and the throat pack was removed.  The  patient was left in the care of  anesthesia for extubation and transport to recovery room with plans for discharge home through day surgery.  ESTIMATED BLOOD LOSS:  Minimum.  COMPLICATIONS:  None.  SPECIMENS:  None.  LN/NUANCE  D:06/18/2018 T:06/18/2018 JOB:[id number]

## 2018-06-18 NOTE — Transfer of Care (Signed)
Immediate Anesthesia Transfer of Care Note  Patient: Dawn Gonzales  Procedure(s) Performed: DENTAL RESTORATION/EXTRACTIONS (N/A Mouth)  Patient Location: PACU  Anesthesia Type:General  Level of Consciousness: awake, alert  and oriented  Airway & Oxygen Therapy: Patient Spontanous Breathing and Patient connected to face mask oxygen  Post-op Assessment: Report given to RN and Post -op Vital signs reviewed and stable  Post vital signs: Reviewed and stable  Last Vitals:  Vitals Value Taken Time  BP 139/84 06/18/2018  9:20 AM  Temp 36.7 C 06/18/2018  9:20 AM  Pulse 90 06/18/2018  9:24 AM  Resp 21 06/18/2018  9:24 AM  SpO2 99 % 06/18/2018  9:24 AM  Vitals shown include unvalidated device data.  Last Pain:  Vitals:   06/18/18 0601  TempSrc: Oral  PainSc: 0-No pain      Patients Stated Pain Goal: 0 (06/18/18 0601)  Complications: No apparent anesthesia complications

## 2018-06-18 NOTE — Op Note (Signed)
06/18/2018  8:58 AM  PATIENT:  Dawn Gonzales  78 y.o. female  PRE-OPERATIVE DIAGNOSIS:  NON-RESTORABLE TEETH # 1, 2, 3, 4, 8, 10, 11, 13, 15, 17, 20, 21, 27, 28, 29, 32  POST-OPERATIVE DIAGNOSIS:  SAME  PROCEDURE:  Procedure(s): EXTRACTIONS TEETH # 1, 2, 3, 4, 8, 10, 11, 13, 15, 17, 20, 21, 27, 28, 29, 32, ALVEOLOPLASTY RIGHT AND LEFT MAXILLA AND MANDIBLE   SURGEON:  Surgeon(s): Ocie Doyne, DDS  ANESTHESIA:   local and general  EBL:  minimal  DRAINS: none   SPECIMEN:  No Specimen  COUNTS:  YES  PLAN OF CARE: Discharge to home after PACU  PATIENT DISPOSITION:  PACU - hemodynamically stable.   PROCEDURE DETAILS: Dictation # [id number]  Dawn Gonzales, DMD 06/18/2018 8:58 AM

## 2018-06-19 ENCOUNTER — Encounter (HOSPITAL_COMMUNITY): Payer: Self-pay | Admitting: Oral Surgery

## 2018-07-01 ENCOUNTER — Encounter: Payer: Self-pay | Admitting: Thoracic Surgery (Cardiothoracic Vascular Surgery)

## 2018-07-01 ENCOUNTER — Other Ambulatory Visit: Payer: Self-pay

## 2018-07-01 ENCOUNTER — Institutional Professional Consult (permissible substitution) (INDEPENDENT_AMBULATORY_CARE_PROVIDER_SITE_OTHER): Payer: Medicare PPO | Admitting: Thoracic Surgery (Cardiothoracic Vascular Surgery)

## 2018-07-01 VITALS — BP 128/68 | HR 64 | Resp 16 | Ht 66.0 in | Wt 269.0 lb

## 2018-07-01 DIAGNOSIS — I4510 Unspecified right bundle-branch block: Secondary | ICD-10-CM | POA: Insufficient documentation

## 2018-07-01 DIAGNOSIS — I350 Nonrheumatic aortic (valve) stenosis: Secondary | ICD-10-CM

## 2018-07-01 NOTE — Patient Instructions (Signed)
   Continue taking all current medications without change through the day before surgery.  Have nothing to eat or drink after midnight the night before surgery.  On the morning of surgery do not take any medications   

## 2018-07-01 NOTE — Progress Notes (Addendum)
[facility]  MULTIDISCIPLINARY HEART VALVE CLINIC  CARDIOTHORACIC SURGERY CONSULTATION REPORT  Referring Provider is McDowell, Samuel G, MD Primary Cardiologist is Christopher McAlhany, MD PCP is Hall, John Z, MD  Chief Complaint  Patient presents with  . 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  Patient states that she has been told that she had a heart murmur for many years.  Approximately 1 year ago she was hospitalized for mitral valve endocarditis that occurred in the setting of cellulitis.  She was diagnosed with severe symptomatic aortic stenosis at that time.  She was evaluated in [place] where she underwent diagnostic cardiac catheterization that revealed minimal nonobstructive coronary artery disease, mildly elevated right heart pressures, severe aortic stenosis.  She was noted to have poor dentition and dental extraction was recommended.  At that time she still had a biliary drain in place having prior to that presented with acute cholecystitis complicated by choledocho cholelithiasis.  Several months later she moved to [place] where she now lives in [place] close to her daughter.  She was admitted to any [facility] October 2019 with recurrent acute cholecystitis complicated by choledocholithiasis and sepsis.  Transthoracic echocardiogram performed at that time revealed severe aortic stenosis with normal left ventricular systolic  function.  She was referred to general surgery but ultimately underwent ERCP with successful stone extraction and biliary sphincterotomy.  Since then she has been seen in follow-up by Dr. Whitefield from [facility] to consider elective cholecystectomy.  She was felt to be a poor candidate and very high risk because of presence of severe aortic stenosis.  She was subsequently referred to the multidisciplinary heart valve clinic and has been evaluated previously by Dr. McAlhany.  Dental consultation was requested and she recently underwent dental extraction.  She has been evaluated for sleep apnea.  She recently underwent CT angiography and has now been referred for cardiac surgical consultation.  She recently underwent dental extraction and has recovered uneventfully.  Patient is married and lives with her husband in [place].  Her husband is in poor health having suffered a significant stroke in the past and experiencing some degree of dementia.  She is accompanied by 1 of her 3 daughters who lives very close by.  Patient has severely limited physical mobility.  She has been using a wheelchair for more than 2 years.  In the house she uses a walker.  She cannot ambulate without some degree of physical assistance.  She has been morbidly obese for all of her adult life.  She suffers from severe chronic degenerative arthritis.  She describes stable symptoms of exertional shortness of breath.  She denies any history of resting shortness of breath, PND, orthopnea, dizzy spells, or syncope.  She has chronic lower extremity edema.  She denies any history of substernal chest pain or chest tightness.  Past Medical History:  Diagnosis Date  . Anemia   . Anxiety   . Aortic stenosis   . Aortic stenosis   . Arthritis   . Cholecystitis   .   Chronic diastolic CHF (congestive heart failure), NYHA class 2 (HCC)   . Complication of anesthesia    very slow to wake up  . Depression   . Heart murmur   .  History of cellulitis   . History of endocarditis   . Hyperlipemia   . Hypertension   . Morbid obesity (HCC)   . Neuropathy   . Right bundle branch block (RBBB)   . Sleep apnea   . Stroke (HCC)   . TIA (transient ischemic attack)   . Tobacco abuse     Past Surgical History:  Procedure Laterality Date  . Abdominal gangrene    . ADENOIDECTOMY    . Cataract surgery    . COLONOSCOPY    . ENDOSCOPIC RETROGRADE CHOLANGIOPANCREATOGRAPHY (ERCP) WITH PROPOFOL N/A 02/08/2018   Procedure: ENDOSCOPIC RETROGRADE CHOLANGIOPANCREATOGRAPHY (ERCP) WITH PROPOFOL;  Surgeon: Mansouraty, Gabriel Jr., MD;  Location: [facility];  Service: Gastroenterology;  Laterality: N/A;  . EUS  02/08/2018   Procedure: UPPER ENDOSCOPIC ULTRASOUND (EUS) LINEAR;  Surgeon: Mansouraty, Gabriel Jr., MD;  Location: [facility] END[facility];  Service: Gastroenterology;;  . IR FLUORO RM 30-60 MIN  03/27/2018  . IR PERC CHOLECYSTOSTOMY  02/09/2018  . IR RADIOLOGIST EVAL & MGMT  03/26/2018  . IR RADIOLOGY PERIPHERAL GUIDED IV START  05/22/2018  . IR RADIOLOGY PERIPHERAL GUIDED IV START  05/30/2018  . IR RADIOLOGY PERIPHERAL GUIDED IV START  06/05/2018  . IR US GUIDE VASC ACCESS RIGHT  05/22/2018  . IR US GUIDE VASC ACCESS RIGHT  05/30/2018  . IR US GUIDE VASC ACCESS RIGHT  06/05/2018  . REMOVAL OF STONES  02/08/2018   Procedure: REMOVAL OF STONES;  Surgeon: Mansouraty, Gabriel Jr., MD;  Location: [facility];  Service: Gastroenterology;;  . SPHINCTEROTOMY  02/08/2018   Procedure: SPHINCTEROTOMY;  Surgeon: Mansouraty, Gabriel Jr., MD;  Location: [facility];  Service: Gastroenterology;;  . TONSILLECTOMY    . TOOTH EXTRACTION N/A 06/18/2018   Procedure: DENTAL RESTORATION/EXTRACTIONS;  Surgeon: Jensen, Scott, DDS;  Location: [facility] OR;  Service: Oral Surgery;  Laterality: N/A;  . Tummy tuck      Family History  Problem Relation Age of Onset  . Cancer Mother   . Hypertension Father   . Arthritis Father   . Heart attack Father     Social  History   Socioeconomic History  . Marital status: Married    Spouse name: Not on file  . Number of children: 3  . Years of education: Not on file  . Highest education level: Not on file  Occupational History  . Occupation: Retired houswife  Social Needs  . Financial resource strain: Not on file  . Food insecurity:    Worry: Not on file    Inability: Not on file  . Transportation needs:    Medical: Not on file    Non-medical: Not on file  Tobacco Use  . Smoking status: Former Smoker    Types: Cigarettes    Last attempt to quit: 05/23/1978    Years since quitting: 40.1  . Smokeless tobacco: Never Used  . Tobacco comment: only 1 pack pewr week   Substance and Sexual Activity  . Alcohol use: Yes    Comment: Occasional  . Drug use: Never  . Sexual activity: Not on file  Lifestyle  . Physical activity:    Days per week: Not on file    Minutes per session: Not on file  . Stress: Not on file  Relationships  . Social connections:      Talks on phone: Not on file    Gets together: Not on file    Attends religious service: Not on file    Active member of club or organization: Not on file    Attends meetings of clubs or organizations: Not on file    Relationship status: Not on file  . Intimate partner violence:    Fear of current or ex partner: Not on file    Emotionally abused: Not on file    Physically abused: Not on file    Forced sexual activity: Not on file  Other Topics Concern  . Not on file  Social History Narrative  . Not on file    Current Outpatient Medications  Medication Sig Dispense Refill  . acetaminophen (TYLENOL) 650 MG CR tablet Take 1,300 mg by mouth every 8 (eight) hours as needed for pain.    . aspirin EC 81 MG tablet Take 81 mg by mouth daily.    . Calcium Carbonate (CALCIUM-CARB 600 PO) Take 600 mg by mouth daily.    . cholecalciferol (VITAMIN D) 1000 units tablet Take 1,000 Units by mouth daily.    . ferrous sulfate 325 (65 FE) MG tablet Take 325  mg by mouth daily with breakfast.    . gabapentin (NEURONTIN) 300 MG capsule Take 300 mg by mouth 3 (three) times daily.    . Multiple Vitamin (MULTIVITAMIN WITH MINERALS) TABS tablet Take 1 tablet by mouth daily.    . niacin 500 MG tablet Take 500 mg by mouth daily.    . nystatin ointment (MYCOSTATIN) Apply 1 application topically 2 (two) times daily as needed (rash).   2  . oxyCODONE (OXY IR/ROXICODONE) 5 MG immediate release tablet Take 1 tablet (5 mg total) by mouth every 4 (four) hours as needed. 20 tablet 0  . pravastatin (PRAVACHOL) 20 MG tablet Take 20 mg by mouth at bedtime.     . traMADol (ULTRAM) 50 MG tablet Take 50 mg by mouth every 6 (six) hours as needed for moderate pain.    . venlafaxine (EFFEXOR) 37.5 MG tablet Take 37.5 mg by mouth daily.    . vitamin C (ASCORBIC ACID) 500 MG tablet Take 500 mg by mouth daily.    . ZINC OXIDE EX Apply 1 application topically daily as needed (wound care).     No current facility-administered medications for this visit.     No Known Allergies    Review of Systems:   General:  normal appetite, decreased energy, + weight gain, no weight loss, no fever  Cardiac:  no chest pain with exertion, no chest pain at rest, +SOB with exertion, no resting SOB, no PND, no orthopnea, no palpitations, no arrhythmia, no atrial fibrillation, + LE edema, no dizzy spells, no syncope  Respiratory:  + shortness of breath, no home oxygen, no productive cough, no dry cough, no bronchitis, no wheezing, no hemoptysis, no asthma, no pain with inspiration or cough, + sleep apnea, no CPAP at night  GI:   no difficulty swallowing, no reflux, no frequent heartburn, no hiatal hernia, no abdominal pain, no constipation, no diarrhea, no hematochezia, no hematemesis, no melena  GU:   no dysuria,  no frequency, no urinary tract infection, no hematuria, no kidney stones, no kidney disease  Vascular:  no pain suggestive of claudication, no pain in feet, no leg cramps, no  varicose veins, no DVT, no non-healing foot ulcer  Neuro:   + stroke, no TIA's, no seizures, no headaches, no temporary blindness   one eye,  no slurred speech, no peripheral neuropathy, + chronic pain, + instability of gait, + mild memory/cognitive dysfunction  Musculoskeletal: + severe arthritis, + joint swelling, no myalgias, + difficulty walking, very limited mobility   Skin:   no rash, + itching, no skin infections, no pressure sores or ulcerations  Psych:   + anxiety, + depression, no nervousness, no unusual recent stress  Eyes:   + blurry vision, no floaters, no recent vision changes, + wears glasses or contacts  ENT:   no hearing loss, no loose or painful teeth, no dentures, last saw dentist 06/18/2018  Hematologic:  + easy bruising, no abnormal bleeding, no clotting disorder, no frequent epistaxis  Endocrine:  no diabetes, does not check CBG's at home           Physical Exam:   BP 128/68 (BP Location: Left Arm, Patient Position: Sitting, Cuff Size: Large)   Pulse 64   Resp 16   Ht 5' 6" (1.676 m)   Wt 269 lb (122 kg)   SpO2 99% Comment: ON RA  BMI 43.42 kg/m   General:  Morbidly obese female NAD    HEENT:  Unremarkable   Neck:   no JVD, no bruits, no adenopathy   Chest:   clear to auscultation, symmetrical breath sounds, no wheezes, no rhonchi   CV:   RRR, grade III/VI crescendo/decrescendo murmur heard best at RSB,  no diastolic murmur  Abdomen:  Soft, very large, non-tender, no masses   Extremities:  warm, well-perfused, pulses not palpable, no LE edema  Rectal/GU  Deferred  Neuro:   Grossly non-focal and symmetrical throughout  Skin:   Clean and dry, no rashes, no breakdown   Diagnostic Tests:  EKG: NSR w/ baseline RBBB   DIAGNOSTIC CARDIAC CATHETERIZATION  Report from diagnostic cardiac catheterization performed June 20, 2017 at [facility] is reviewed.  Films from this diagnostic cardiac catheterization are not currently available for  review.  By report there was "no significant coronary artery disease".  Mean transvalvular gradient across the aortic valve was measured 45 mmHg.  Pulmonary artery pressures were reported 53/13 with mean pulmonary artery pressure 33 mmHg.  Pulmonary capillary wedge pressure was 16 mmHg.  Cardiac output was 7.24 L corresponding to cardiac index of 4.4.  Mean central venous pressure was 10 mmHg.   Transthoracic Echocardiography  Patient:    Dawn Gonzales, Dawn Gonzales MR #:       [id number] Study Date: 02/01/2018 Gender:     F Age:        77 Height:     167.6 cm Weight:     124.3 kg BSA:        2.47 m^2 Pt. Status: Room:   ORDERING     Samuel McDowell, M.D.  REFERRING    Samuel McDowell, M.D.  REFERRING    Hall, Zach  ATTENDING    Hall, John &'Zach&' Z  SONOGRAPHER  Johanna Elliott  PERFORMING   [facility] [place]  cc:  ------------------------------------------------------------------- LV EF: 55% -   60%  ------------------------------------------------------------------- Indications:      Murmur 785.2.  ------------------------------------------------------------------- History:   PMH:   Murmur.  Bacteremia.  Risk factors: Dyslipidemia.  ------------------------------------------------------------------- Study Conclusions  - Left ventricle: The cavity size was normal. Wall thickness was   increased in a pattern of severe LVH. Systolic function was   normal. The estimated ejection fraction was in the range of 55%   to 60%. Wall motion was normal; there were no   regional wall   motion abnormalities. Doppler parameters are consistent with   abnormal left ventricular relaxation (grade 1 diastolic   dysfunction). Doppler parameters are consistent with high   ventricular filling pressure. - Aortic valve: Severely calcified annulus. Trileaflet; severely   thickened leaflets. There was severe stenosis. Mean gradient (S):   51 mm Hg. VTI ratio of LVOT to aortic valve: 0.18. Valve  area   (VTI): 0.67 cm^2. Valve area (Vmax): 0.76 cm^2. Valve area   (Vmean): 0.67 cm^2. - Mitral valve: Severely calcified annulus. The findings are   consistent with mild stenosis. Mean gradient (D): 3 mm Hg. Valve   area by pressure half-time: 2.18 cm^2. Valve area by continuity   equation (using LVOT flow): 1.8 cm^2. - Left atrium: The atrium was moderately dilated. - Atrial septum: There was increased thickness of the septum,   consistent with lipomatous hypertrophy. No defect or patent   foramen ovale was identified.  ------------------------------------------------------------------- Study data:   Study status:  Routine.  Procedure:  Transthoracic echocardiography. Image quality was adequate.  Study completion: There were no complications.          Transthoracic echocardiography.  M-mode, complete 2D, spectral Doppler, and color Doppler.  Birthdate:  Patient birthdate: 12/26/1940.  Age:  Patient is 77 yr old.  Sex:  Gender: female.    BMI: 44.3 kg/m^2.  Blood pressure:     156/88  Patient status:  Outpatient.  Study date: Study date: 02/01/2018. Study time: 01:18 PM.  Location:  Echo laboratory.  -------------------------------------------------------------------  ------------------------------------------------------------------- Left ventricle:  The cavity size was normal. Wall thickness was increased in a pattern of severe LVH. Systolic function was normal. The estimated ejection fraction was in the range of 55% to 60%. Wall motion was normal; there were no regional wall motion abnormalities. Doppler parameters are consistent with abnormal left ventricular relaxation (grade 1 diastolic dysfunction). Doppler parameters are consistent with high ventricular filling pressure.   ------------------------------------------------------------------- Aortic valve:   Severely calcified annulus. Trileaflet; severely thickened leaflets.  Doppler:   There was severe stenosis.    There was no significant regurgitation.    VTI ratio of LVOT to aortic valve: 0.18. Valve area (VTI): 0.67 cm^2. Indexed valve area (VTI): 0.27 cm^2/m^2. Peak velocity ratio of LVOT to aortic valve: 0.2. Valve area (Vmax): 0.76 cm^2. Indexed valve area (Vmax): 0.31 cm^2/m^2. Mean velocity ratio of LVOT to aortic valve: 0.18. Valve area (Vmean): 0.67 cm^2. Indexed valve area (Vmean): 0.27 cm^2/m^2.    Mean gradient (S): 51 mm Hg. Peak gradient (S): 80 mm Hg.  ------------------------------------------------------------------- Aorta:  Aortic root: The aortic root was normal in size.  ------------------------------------------------------------------- Mitral valve:   Severely calcified annulus.  Doppler:   The findings are consistent with mild stenosis.   There was no significant regurgitation.    Valve area by pressure half-time: 2.18 cm^2. Indexed valve area by pressure half-time: 0.88 cm^2/m^2. Valve area by continuity equation (using LVOT flow): 1.8 cm^2. Indexed valve area by continuity equation (using LVOT flow): 0.73 cm^2/m^2.    Mean gradient (D): 3 mm Hg. Peak gradient (D): 3 mm Hg.  ------------------------------------------------------------------- Left atrium:  The atrium was moderately dilated.  ------------------------------------------------------------------- Atrial septum:  There was increased thickness of the septum, consistent with lipomatous hypertrophy. No defect or patent foramen ovale was identified.  ------------------------------------------------------------------- Right ventricle:  The cavity size was normal. Wall thickness was normal. Systolic function was normal.  ------------------------------------------------------------------- Pulmonic valve:   Not well visualized.  Doppler:   There was   no evidence for stenosis.   There was no significant regurgitation.   ------------------------------------------------------------------- Tricuspid valve:    Normal thickness leaflets.  Doppler:   There was no evidence for stenosis.   There was no significant regurgitation.   ------------------------------------------------------------------- Pulmonary artery:    Systolic pressure could not be accurately estimated.   Inadequate TR jet.  ------------------------------------------------------------------- Right atrium:  The atrium was normal in size.  ------------------------------------------------------------------- Pericardium:  There was no pericardial effusion.  ------------------------------------------------------------------- Systemic veins: Inferior vena cava: The vessel was normal in size. The respirophasic diameter changes were in the normal range (>= 50%), consistent with normal central venous pressure.  ------------------------------------------------------------------- Measurements   Left ventricle                          Value           Reference  LV ID, ED, PLAX chordal         (L)     41.1   mm       43 - 52  LV ID, ES, PLAX chordal                 23.5   mm       23 - 38  LV fx shortening, PLAX chordal          43     %        >=29  LV PW thickness, ED                     15     mm       ----------  IVS/LV PW ratio, ED                     1.01            <=1.3  Stroke volume, 2D                       71     ml       ----------  Stroke volume/bsa, 2D                   29     ml/m^2   ----------  LV e&', lateral                          6.74   cm/s     ----------  LV E/e&', lateral                        13.61           ----------  LV e&', medial                           4.13   cm/s     ----------  LV E/e&', medial                         22.2            ----------  LV e&', average                          5.44   cm/s     ----------  LV E/e&', average                          16.87           ----------  LV ejection time                        300    ms       ----------    Ventricular septum                       Value           Reference  IVS thickness, ED                       15.2   mm       ----------    LVOT                                    Value           Reference  LVOT ID, S                              22     mm       ----------  LVOT area                               3.8    cm^2     ----------  LVOT peak velocity, S                   89.04  cm/s     ----------  LVOT mean velocity, S                   59.6   cm/s     ----------  LVOT VTI, S                             17.59  cm       ----------  Stroke volume (SV), LVOT DP             66.9   ml       ----------  Stroke index (SV/bsa), LVOT DP          27     ml/m^2   ----------    Aortic valve                            Value           Reference  Aortic valve peak velocity, S           446.38 cm/s     ----------  Aortic valve mean velocity, S           340.05 cm/s     ----------  Aortic valve VTI, S                     99.38  cm       ----------  Aortic mean gradient, S                 51     mm Hg    ----------  Aortic peak gradient, S                   80     mm Hg    ----------  VTI ratio, LVOT/AV                      0.18            ----------  Aortic valve area, VTI                  0.67   cm^2     ----------  Aortic valve area/bsa, VTI              0.27   cm^2/m^2 ----------  Velocity ratio, peak, LVOT/AV           0.2             ----------  Aortic valve area, peak                 0.76   cm^2     ----------  velocity  Aortic valve area/bsa, peak             0.31   cm^2/m^2 ----------  velocity  Velocity ratio, mean, LVOT/AV           0.18            ----------  Aortic valve area, mean                 0.67   cm^2     ----------  velocity  Aortic valve area/bsa, mean             0.27   cm^2/m^2 ----------  velocity    Aorta                                   Value           Reference  Aortic root ID, ED                      35     mm       ----------    Left atrium                             Value           Reference  LA ID,  A-P, ES                          38     mm       ----------  LA ID/bsa, A-P                          1.54   cm/m^2   <=2.2  LA volume, S                            88     ml       ----------  LA volume/bsa, S                        35.6   ml/m^2   ----------  LA volume, ES, 1-p A4C                  82.6   ml       ----------  LA   volume/bsa, ES, 1-p A4C              33.4   ml/m^2   ----------  LA volume, ES, 1-p A2C                  91.4   ml       ----------  LA volume/bsa, ES, 1-p A2C              37     ml/m^2   ----------    Mitral valve                            Value           Reference  Mitral E-wave peak velocity             91.7   cm/s     ----------  Mitral A-wave peak velocity             118    cm/s     ----------  Mitral mean velocity, D                 80.7   cm/s     ----------  Mitral deceleration time        (H)     268    ms       150 - 230  Mitral pressure half-time               101    ms       ----------  Mitral mean gradient, D                 3      mm Hg    ----------  Mitral peak gradient, D                 3      mm Hg    ----------  Mitral E/A ratio, peak                  0.8             ----------  Mitral valve area, PHT, DP              2.18   cm^2     ----------  Mitral valve area/bsa, PHT, DP          0.88   cm^2/m^2 ----------  Mitral valve area, LVOT                 1.8    cm^2     ----------  continuity  Mitral valve area/bsa, LVOT             0.73   cm^2/m^2 ----------  continuity  Mitral annulus VTI, D                   39.2   cm       ----------    Right atrium                            Value           Reference  RA ID, S-I, ES, A4C             (H)     58.9   mm       34 - 49  RA area, ES, A4C                          18     cm^2     8.3 - 19.5  RA volume, ES, A/L                      47.5   ml       ----------  RA volume/bsa, ES, A/L                  19.2   ml/m^2   ----------    Systemic veins                          Value           Reference   Estimated CVP                           3      mm Hg    ----------    Right ventricle                         Value           Reference  TAPSE                                   21.4   mm       ----------  RV s&', lateral, S                       14.3   cm/s     ----------  Legend: (L)  and  (H)  mark values outside specified reference range.  ------------------------------------------------------------------- Prepared and Electronically Authenticated by  Jonathon Branch, M.D. [id number]:59:55   Cardiac TAVR CT  TECHNIQUE: The patient was scanned on a Siemens Force 192 slice scanner. A 120 kV retrospective scan was triggered in the descending thoracic aorta at 111 HU's. Gantry rotation speed was 270 msecs and collimation was .9 mm. No beta blockade or nitro were given. The 3D data set was reconstructed in 5% intervals of the R-R cycle. Systolic and diastolic phases were analyzed on a dedicated work station using MPR, MIP and VRT modes. The patient received 80 cc of contrast.  FINDINGS: Aortic Valve: Trileaflet valve with severely reduced leaflet excursion and moderate calcification.  Aorta: No significant calcifications, conventional 3 vessel branch pattern of the aortic arch with the R brachiocephalic arising slightly proximal on the distal ascending aorta.  Sinotubular Junction: 31 mm  Ascending Thoracic Aorta: 40 mm  Aortic Arch: 30 mm  Descending Thoracic Aorta: 27 mm  Sinus of Valsalva Measurements:  Non-coronary: 34 mm  Right - coronary: 34 mm  Left - coronary: 36 mm  Coronary Artery Height above Annulus:  Left Main: 14.2 mm  Right Coronary: 16.7 mm  Virtual Basal Annulus Measurements:  Maximum/Minimum Diameter: 29 X 23.2 mm  Perimeter: 82.5 mm  Area: 522 mm2  There is a focal calcification in the annulus just below the left coronary cusp.  Coronary Arteries: Normal origin and course. Adequate  coronary height.  Optimum Fluoroscopic Angle for Delivery: LAO 22, CAU 14  No left atrial appendage thrombus, large left atrial appendage.  Severe mitral annular calcification.  IMPRESSION: 1. Trileaflet aortic valve with severely reduced leaflet excursion and moderate calcifications. Annulus area is 522 mm2, which would be suitable for a 26 mm Sapien S3   valve.  2.  Focal annular calcification below the left coronary cusp.  3. Mid ascending aorta dilation, 40 mm in maximal dimension measured in an orthogonal view. Remainder of aorta appears normal size.  4. Optimum Fluoroscopic Angle for Delivery: LAO 22, CAU 14  5. Normal origin and course of coronary arteries with adequate coronary ostial height.  6. No left atrial appendage thrombus.  Gayatri Acharya   Electronically Signed   By: Gayatri  Acharya   On: 06/05/2018 17:14   CT ANGIOGRAPHY CHEST, ABDOMEN AND PELVIS  TECHNIQUE: Multidetector CT imaging through the chest, abdomen and pelvis was performed using the standard protocol during bolus administration of intravenous contrast. Multiplanar reconstructed images and MIPs were obtained and reviewed to evaluate the vascular anatomy.  CONTRAST:  100mL ISOVUE-370 IOPAMIDOL (ISOVUE-370) INJECTION 76%  COMPARISON:  CT the abdomen and pelvis 02/09/2018.  FINDINGS: CTA CHEST FINDINGS  Cardiovascular: Heart size is mildly enlarged. There is no significant pericardial fluid, thickening or pericardial calcification. There is aortic atherosclerosis, as well as atherosclerosis of the great vessels of the mediastinum and the coronary arteries, including calcified atherosclerotic plaque in the left anterior descending and right coronary arteries. Severe thickening calcification of the aortic valve. Calcifications of the mitral annulus. Dilatation of the pulmonic trunk (4.1 cm in diameter).  Mediastinum/Lymph Nodes: No pathologically enlarged mediastinal  or hilar lymph nodes. Esophagus is unremarkable in appearance. No axillary lymphadenopathy.  Lungs/Pleura: Patchy areas of ground-glass attenuation and interlobular septal thickening throughout the lungs bilaterally, favored to reflect mild interstitial pulmonary edema. No acute consolidative airspace disease. No pleural effusions. No suspicious appearing pulmonary nodules or masses.  Musculoskeletal/Soft Tissues: There are no aggressive appearing lytic or blastic lesions noted in the visualized portions of the skeleton.  CTA ABDOMEN AND PELVIS FINDINGS  Hepatobiliary: 9 mm low-attenuation lesion in segment 4A of the liver, too small to characterize, but statistically likely to represent a tiny cyst. No other larger more suspicious cystic or solid hepatic lesions. No intra or extrahepatic biliary ductal dilatation. Pneumobilia, presumably from prior sphincterotomy. Numerous partially calcified gallstones filling the gallbladder measuring up to 2 cm in diameter.  Pancreas: No pancreatic mass. No pancreatic ductal dilatation. No pancreatic or peripancreatic fluid or inflammatory changes.  Spleen: Unremarkable.  Adrenals/Urinary Tract: In the medial aspect of the upper pole the left kidney there is a 2.4 cm exophytic low-attenuation lesion compatible with a simple cyst. In the lateral aspect of the lower pole of the right kidney there is a 1.9 cm lesion which demonstrates some potential peripheral mural nodularity and enhancement (axial image 114 of series 15). Mild bilateral renal atrophy. No hydroureteronephrosis. Urinary bladder is normal in appearance. Bilateral adrenal glands are normal in appearance.  Stomach/Bowel: Normal appearance of the stomach. No pathologic dilatation of small bowel or colon. Normal appendix.  Vascular/Lymphatic: Aortic atherosclerosis, with vascular findings and measurements pertinent to potential TAVR procedure, as detailed below. No  aneurysm or dissection noted in the abdominal or pelvic vasculature. No lymphadenopathy noted in the abdomen or pelvis.  Reproductive: Uterus and ovaries are unremarkable in appearance.  Other: No significant volume of ascites.  No pneumoperitoneum.  Musculoskeletal: There are no aggressive appearing lytic or blastic lesions noted in the visualized portions of the skeleton.  VASCULAR MEASUREMENTS PERTINENT TO TAVR:  AORTA:  Minimal Aortic Diameter-20 x 19 mm  Severity of Aortic Calcification-mild  RIGHT PELVIS:  Right Common Iliac Artery -  Minimal Diameter-12.3 x 11.1 mm  Tortuosity-severe  Calcification-mild  Right External Iliac Artery -    Minimal Diameter-9.8 x 9.2 mm  Tortuosity - severe  Calcification-none  Right Common Femoral Artery -  Minimal Diameter-9.4 x 9.7 mm  Tortuosity-mild  Calcification-none  LEFT PELVIS:  Left Common Iliac Artery -  Minimal Diameter-16.8 x 9.2 mm  Tortuosity - severe  Calcification-mild  Left External Iliac Artery -  Minimal Diameter-9.3 x 8.3 mm  Tortuosity - severe  Calcification-none  Left Common Femoral Artery -  Minimal Diameter-10.3 x 10.0 mm  Tortuosity-mild  Calcification-none  Review of the MIP images confirms the above findings.  IMPRESSION: 1. Vascular findings and measurements pertinent to potential TAVR procedure, as detailed above. 2. Severe thickening calcification of the aortic valve, compatible with the reported clinical history of severe aortic stenosis. 3. Aortic atherosclerosis, in addition to 2 vessel coronary artery disease. Assessment for potential risk factor modification, dietary therapy or pharmacologic therapy may be warranted, if clinically indicated. 4. Dilatation of the pulmonic trunk (4.1 cm in diameter), concerning for pulmonary arterial hypertension. 5. 1.9 cm lesion in the periphery of the lower pole the right kidney which may have  some enhancing mural nodularity. 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 6. Cholelithiasis. 7. Additional incidental findings, as above.   Electronically Signed   By: Daniel  Entrikin M.D.   On: 06/03/2018 10:40     Impression:  Patient has stage D severe symptomatic aortic stenosis.  She describes stable symptoms of exertional shortness of breath and fatigue consistent with chronic diastolic congestive heart failure, [place] Heart Association functional class IIb-III. I suspect the patient would be much more symptomatic if she were more able to get around physically, but she is extremely limited with very poor mobility because of longstanding morbid obesity and severe degenerative arthritis.  I have personally reviewed the patient's most recent transthoracic echocardiogram and CT angiograms.  There is normal left ventricular systolic function.  The aortic valve is trileaflet.  There is severe thickening, calcification, and restricted leaflet mobility involving all 3 leaflets of the aortic valve.  1 of the 3 leaflets moves a little bit better than the other 2 which are essentially immobile.  Peak velocity across the aortic valve measured close to 4.5 m/s corresponding to mean transvalvular gradient estimated 51 mmHg.  The DVI was notably only 0.18 with aortic valve area calculated 0.67 cm.  By report, diagnostic cardiac catheterization performed February 2019 revealed no significant coronary artery disease and confirmed the presence of severe aortic stenosis with mean transvalvular gradient measured 45 mmHg at catheterization.  Pulmonary artery pressures were mildly elevated.  Although the patient's risks associated with conventional surgical aortic valve replacement would only be mild to moderately elevated using the STS  risk calculator, I would not consider this patient a candidate for conventional surgery because of her extremely limited physical mobility and poor functional status.  Moreover, she has had 2 recent hospitalizations for sepsis related to acute cholecystitis and choledocholithiasis.  She will need to have her gallbladder removed as soon as practical once her aortic stenosis has been treated.    Cardiac-gated CTA of the heart reveals anatomical characteristics consistent with aortic stenosis suitable for treatment by transcatheter aortic valve replacement without any significant complicating features, although there is one area of calcification in the aortic annulus which might slightly increase the risk of paravalvular leak.  CTA of the aorta and iliac vessels demonstrate   what appears to be adequate pelvic vascular access to facilitate a transfemoral approach.  The patient has right bundle branch block at baseline on her recent EKG which will put her at increased risk for the development of complete heart block postoperatively.    Plan:  The patient and her daughter were counseled at length regarding treatment alternatives for management of severe symptomatic aortic stenosis. Alternative approaches such as conventional aortic valve replacement, transcatheter aortic valve replacement, and continued medical therapy without intervention were compared and contrasted at length.  The risks associated with conventional surgical aortic valve replacement were discussed in detail, as were expectations for post-operative convalescence, and why I would be reluctant to consider this patient a candidate for conventional surgery.  Issues specific to transcatheter aortic valve replacement were discussed including questions about long term valve durability, the potential for paravalvular leak, possible increased risk of need for permanent pacemaker placement, and other technical complications related to the procedure itself.   Long-term prognosis with medical therapy was discussed. This discussion was placed in the context of the patient's own specific clinical presentation and past medical history.  All of their questions have been addressed.  The patient desires to proceed with transcatheter aortic valve replacement as soon as possible.  We tentatively plan for surgery on July 09, 2018.  Following the decision to proceed with transcatheter aortic valve replacement, a discussion has been held regarding what types of management strategies would be attempted intraoperatively in the event of life-threatening complications, including the fact that the patient would not be considered a candidate for conversion to open sternotomy for attempted surgical intervention.  The patient has been advised of a variety of complications that might develop including but not limited to risks of death, stroke, paravalvular leak, aortic dissection or other major vascular complications, aortic annulus rupture, device embolization, cardiac rupture or perforation, mitral regurgitation, acute myocardial infarction, arrhythmia, heart block or bradycardia requiring permanent pacemaker placement, congestive heart failure, respiratory failure, renal failure, pneumonia, infection, other late complications related to structural valve deterioration or migration, or other complications that might ultimately cause a temporary or permanent loss of functional independence or other long term morbidity.  The patient provides full informed consent for the procedure as described and all questions were answered.   I spent in excess of 90 minutes during the conduct of this office consultation and >50% of this time involved direct face-to-face encounter with the patient for counseling and/or coordination of their care.     Clarence H. Owen, MD 07/01/2018 12:14 PM    The patient's diagnostic cardiac catheterization films from [facility] in  February 2019 have been located.  I have had the opportunity to review these films.  I agree with the report as notated previously.  There does not appear to be any significant coronary artery disease.  The patient does have significant mitral annular calcification.  Clarence H Owen, MD 07/01/2018 5:01 PM    

## 2018-07-01 NOTE — H&P (View-Only) (Signed)
[facility]  MULTIDISCIPLINARY HEART VALVE CLINIC  CARDIOTHORACIC SURGERY CONSULTATION REPORT  Referring Provider is Jonelle Sidle, MD Primary Cardiologist is Verne Carrow, MD PCP is Benita Stabile, MD  Chief Complaint  Patient presents with  . 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  Patient states that she has been told that she had a heart murmur for many years.  Approximately 1 year ago she was hospitalized for mitral valve endocarditis that occurred in the setting of cellulitis.  She was diagnosed with severe symptomatic aortic stenosis at that time.  She was evaluated in [place] where she underwent diagnostic cardiac catheterization that revealed minimal nonobstructive coronary artery disease, mildly elevated right heart pressures, severe aortic stenosis.  She was noted to have poor dentition and dental extraction was recommended.  At that time she still had a biliary drain in place having prior to that presented with acute cholecystitis complicated by choledocho cholelithiasis.  Several months later she moved to [place] where she now lives in [place] close to her daughter.  She was admitted to any [facility] October 2019 with recurrent acute cholecystitis complicated by choledocholithiasis and sepsis.  Transthoracic echocardiogram performed at that time revealed severe aortic stenosis with normal left ventricular systolic  function.  She was referred to general surgery but ultimately underwent ERCP with successful stone extraction and biliary sphincterotomy.  Since then she has been seen in follow-up by Dr. Lessie Dings from [facility] to consider elective cholecystectomy.  She was felt to be a poor candidate and very high risk because of presence of severe aortic stenosis.  She was subsequently referred to the multidisciplinary heart valve clinic and has been evaluated previously by Dr. Clifton James.  Dental consultation was requested and she recently underwent dental extraction.  She has been evaluated for sleep apnea.  She recently underwent CT angiography and has now been referred for cardiac surgical consultation.  She recently underwent dental extraction and has recovered uneventfully.  Patient is married and lives with her husband in [place].  Her husband is in poor health having suffered a significant stroke in the past and experiencing some degree of dementia.  She is accompanied by 1 of her 3 daughters who lives very close by.  Patient has severely limited physical mobility.  She has been using a wheelchair for more than 2 years.  In the house she uses a walker.  She cannot ambulate without some degree of physical assistance.  She has been morbidly obese for all of her adult life.  She suffers from severe chronic degenerative arthritis.  She describes stable symptoms of exertional shortness of breath.  She denies any history of resting shortness of breath, PND, orthopnea, dizzy spells, or syncope.  She has chronic lower extremity edema.  She denies any history of substernal chest pain or chest tightness.  Past Medical History:  Diagnosis Date  . Anemia   . Anxiety   . Aortic stenosis   . Aortic stenosis   . Arthritis   . Cholecystitis   .  Chronic diastolic CHF (congestive heart failure), NYHA class 2 (HCC)   . Complication of anesthesia    very slow to wake up  . Depression   . Heart murmur   .  History of cellulitis   . History of endocarditis   . Hyperlipemia   . Hypertension   . Morbid obesity (HCC)   . Neuropathy   . Right bundle branch block (RBBB)   . Sleep apnea   . Stroke (HCC)   . TIA (transient ischemic attack)   . Tobacco abuse     Past Surgical History:  Procedure Laterality Date  . Abdominal gangrene    . ADENOIDECTOMY    . Cataract surgery    . COLONOSCOPY    . ENDOSCOPIC RETROGRADE CHOLANGIOPANCREATOGRAPHY (ERCP) WITH PROPOFOL N/A 02/08/2018   Procedure: ENDOSCOPIC RETROGRADE CHOLANGIOPANCREATOGRAPHY (ERCP) WITH PROPOFOL;  Surgeon: Meridee Score Netty Starring., MD;  Location: [facility] ENDOSCOPY;  Service: Gastroenterology;  Laterality: N/A;  . EUS  02/08/2018   Procedure: UPPER ENDOSCOPIC ULTRASOUND (EUS) LINEAR;  Surgeon: Lemar Lofty., MD;  Location: [facility] ENDOSCOPY;  Service: Gastroenterology;;  . IR FLUORO RM 30-60 MIN  03/27/2018  . IR PERC CHOLECYSTOSTOMY  02/09/2018  . IR RADIOLOGIST EVAL & MGMT  03/26/2018  . IR RADIOLOGY PERIPHERAL GUIDED IV START  05/22/2018  . IR RADIOLOGY PERIPHERAL GUIDED IV START  05/30/2018  . IR RADIOLOGY PERIPHERAL GUIDED IV START  06/05/2018  . IR US GUIDE VASC ACCESS RIGHT  05/22/2018  . IR US GUIDE VASC ACCESS RIGHT  05/30/2018  . IR US GUIDE VASC ACCESS RIGHT  06/05/2018  . REMOVAL OF STONES  02/08/2018   Procedure: REMOVAL OF STONES;  Surgeon: Meridee Score Netty Starring., MD;  Location: [facility] ENDOSCOPY;  Service: Gastroenterology;;  . Dennison Mascot  02/08/2018   Procedure: Dennison Mascot;  Surgeon: Mansouraty, Netty Starring., MD;  Location: [facility] ENDOSCOPY;  Service: Gastroenterology;;  . TONSILLECTOMY    . TOOTH EXTRACTION N/A 06/18/2018   Procedure: DENTAL RESTORATION/EXTRACTIONS;  Surgeon: Ocie Doyne, DDS;  Location: [facility] OR;  Service: Oral Surgery;  Laterality: N/A;  . Tummy tuck      Family History  Problem Relation Age of Onset  . Cancer Mother   . Hypertension Father   . Arthritis Father   . Heart attack Father     Social  History   Socioeconomic History  . Marital status: Married    Spouse name: Not on file  . Number of children: 3  . Years of education: Not on file  . Highest education level: Not on file  Occupational History  . Occupation: Retired Civil engineer, contracting  . Financial resource strain: Not on file  . Food insecurity:    Worry: Not on file    Inability: Not on file  . Transportation needs:    Medical: Not on file    Non-medical: Not on file  Tobacco Use  . Smoking status: Former Smoker    Types: Cigarettes    Last attempt to quit: 05/23/1978    Years since quitting: 40.1  . Smokeless tobacco: Never Used  . Tobacco comment: only 1 pack pewr week   Substance and Sexual Activity  . Alcohol use: Yes    Comment: Occasional  . Drug use: Never  . Sexual activity: Not on file  Lifestyle  . Physical activity:    Days per week: Not on file    Minutes per session: Not on file  . Stress: Not on file  Relationships  . Social connections:  Talks on phone: Not on file    Gets together: Not on file    Attends religious service: Not on file    Active member of club or organization: Not on file    Attends meetings of clubs or organizations: Not on file    Relationship status: Not on file  . Intimate partner violence:    Fear of current or ex partner: Not on file    Emotionally abused: Not on file    Physically abused: Not on file    Forced sexual activity: Not on file  Other Topics Concern  . Not on file  Social History Narrative  . Not on file    Current Outpatient Medications  Medication Sig Dispense Refill  . acetaminophen (TYLENOL) 650 MG CR tablet Take 1,300 mg by mouth every 8 (eight) hours as needed for pain.    Marland Kitchen aspirin EC 81 MG tablet Take 81 mg by mouth daily.    . Calcium Carbonate (CALCIUM-CARB 600 PO) Take 600 mg by mouth daily.    . cholecalciferol (VITAMIN D) 1000 units tablet Take 1,000 Units by mouth daily.    . ferrous sulfate 325 (65 FE) MG tablet Take 325  mg by mouth daily with breakfast.    . gabapentin (NEURONTIN) 300 MG capsule Take 300 mg by mouth 3 (three) times daily.    . Multiple Vitamin (MULTIVITAMIN WITH MINERALS) TABS tablet Take 1 tablet by mouth daily.    . niacin 500 MG tablet Take 500 mg by mouth daily.    Marland Kitchen nystatin ointment (MYCOSTATIN) Apply 1 application topically 2 (two) times daily as needed (rash).   2  . oxyCODONE (OXY IR/ROXICODONE) 5 MG immediate release tablet Take 1 tablet (5 mg total) by mouth every 4 (four) hours as needed. 20 tablet 0  . pravastatin (PRAVACHOL) 20 MG tablet Take 20 mg by mouth at bedtime.     . traMADol (ULTRAM) 50 MG tablet Take 50 mg by mouth every 6 (six) hours as needed for moderate pain.    Marland Kitchen venlafaxine (EFFEXOR) 37.5 MG tablet Take 37.5 mg by mouth daily.    . vitamin C (ASCORBIC ACID) 500 MG tablet Take 500 mg by mouth daily.    Marland Kitchen ZINC OXIDE EX Apply 1 application topically daily as needed (wound care).     No current facility-administered medications for this visit.     No Known Allergies    Review of Systems:   General:  normal appetite, decreased energy, + weight gain, no weight loss, no fever  Cardiac:  no chest pain with exertion, no chest pain at rest, +SOB with exertion, no resting SOB, no PND, no orthopnea, no palpitations, no arrhythmia, no atrial fibrillation, + LE edema, no dizzy spells, no syncope  Respiratory:  + shortness of breath, no home oxygen, no productive cough, no dry cough, no bronchitis, no wheezing, no hemoptysis, no asthma, no pain with inspiration or cough, + sleep apnea, no CPAP at night  GI:   no difficulty swallowing, no reflux, no frequent heartburn, no hiatal hernia, no abdominal pain, no constipation, no diarrhea, no hematochezia, no hematemesis, no melena  GU:   no dysuria,  no frequency, no urinary tract infection, no hematuria, no kidney stones, no kidney disease  Vascular:  no pain suggestive of claudication, no pain in feet, no leg cramps, no  varicose veins, no DVT, no non-healing foot ulcer  Neuro:   + stroke, no TIA's, no seizures, no headaches, no temporary blindness  one eye,  no slurred speech, no peripheral neuropathy, + chronic pain, + instability of gait, + mild memory/cognitive dysfunction  Musculoskeletal: + severe arthritis, + joint swelling, no myalgias, + difficulty walking, very limited mobility   Skin:   no rash, + itching, no skin infections, no pressure sores or ulcerations  Psych:   + anxiety, + depression, no nervousness, no unusual recent stress  Eyes:   + blurry vision, no floaters, no recent vision changes, + wears glasses or contacts  ENT:   no hearing loss, no loose or painful teeth, no dentures, last saw dentist 06/18/2018  Hematologic:  + easy bruising, no abnormal bleeding, no clotting disorder, no frequent epistaxis  Endocrine:  no diabetes, does not check CBG's at home           Physical Exam:   BP 128/68 (BP Location: Left Arm, Patient Position: Sitting, Cuff Size: Large)   Pulse 64   Resp 16   Ht  (1.676 m)   Wt 269 lb (122 kg)   SpO2 99% Comment: ON RA  BMI 43.42 kg/m   General:  Morbidly obese female NAD    HEENT:  Unremarkable   Neck:   no JVD, no bruits, no adenopathy   Chest:   clear to auscultation, symmetrical breath sounds, no wheezes, no rhonchi   CV:   RRR, grade III/VI crescendo/decrescendo murmur heard best at RSB,  no diastolic murmur  Abdomen:  Soft, very large, non-tender, no masses   Extremities:  warm, well-perfused, pulses not palpable, no LE edema  Rectal/GU  Deferred  Neuro:   Grossly non-focal and symmetrical throughout  Skin:   Clean and dry, no rashes, no breakdown   Diagnostic Tests:  EKG: NSR w/ baseline RBBB   DIAGNOSTIC CARDIAC CATHETERIZATION  Report from diagnostic cardiac catheterization performed June 20, 2017 at [facility] is reviewed.  Films from this diagnostic cardiac catheterization are not currently available for  review.  By report there was "no significant coronary artery disease".  Mean transvalvular gradient across the aortic valve was measured 45 mmHg.  Pulmonary artery pressures were reported 53/13 with mean pulmonary artery pressure 33 mmHg.  Pulmonary capillary wedge pressure was 16 mmHg.  Cardiac output was 7.24 L corresponding to cardiac index of 4.4.  Mean central venous pressure was 10 mmHg.   Transthoracic Echocardiography  Patient:    Jasmon, Mattice MR #:       [id number] Study Date: 02/01/2018 Gender:     F Age:        19 Height:     167.6 cm Weight:     124.3 kg BSA:        2.47 m^2 Pt. Status: Room:   Lindi Adie, M.D.  REFERRING    Nona Dell, M.D.  REFERRING    Catalina Pizza  ATTENDING    Margo Aye, John &'Zach&' Z  SONOGRAPHER  Jeryl Columbia  PERFORMING   [facility], Jeani Hawking  cc:  ------------------------------------------------------------------- LV EF: 55% -   60%  ------------------------------------------------------------------- Indications:      Murmur 785.2.  ------------------------------------------------------------------- History:   PMH:   Murmur.  Bacteremia.  Risk factors: Dyslipidemia.  ------------------------------------------------------------------- Study Conclusions  - Left ventricle: The cavity size was normal. Wall thickness was   increased in a pattern of severe LVH. Systolic function was   normal. The estimated ejection fraction was in the range of 55%   to 60%. Wall motion was normal; there were no  regional wall   motion abnormalities. Doppler parameters are consistent with   abnormal left ventricular relaxation (grade 1 diastolic   dysfunction). Doppler parameters are consistent with high   ventricular filling pressure. - Aortic valve: Severely calcified annulus. Trileaflet; severely   thickened leaflets. There was severe stenosis. Mean gradient (S):   51 mm Hg. VTI ratio of LVOT to aortic valve: 0.18. Valve  area   (VTI): 0.67 cm^2. Valve area (Vmax): 0.76 cm^2. Valve area   (Vmean): 0.67 cm^2. - Mitral valve: Severely calcified annulus. The findings are   consistent with mild stenosis. Mean gradient (D): 3 mm Hg. Valve   area by pressure half-time: 2.18 cm^2. Valve area by continuity   equation (using LVOT flow): 1.8 cm^2. - Left atrium: The atrium was moderately dilated. - Atrial septum: There was increased thickness of the septum,   consistent with lipomatous hypertrophy. No defect or patent   foramen ovale was identified.  ------------------------------------------------------------------- Study data:   Study status:  Routine.  Procedure:  Transthoracic echocardiography. Image quality was adequate.  Study completion: There were no complications.          Transthoracic echocardiography.  M-mode, complete 2D, spectral Doppler, and color Doppler.  Birthdate:  Patient birthdate: 01-19-1941.  Age:  Patient is 78 yr old.  Sex:  Gender: female.    BMI: 44.3 kg/m^2.  Blood pressure:     156/88  Patient status:  Outpatient.  Study date: Study date: 02/01/2018. Study time: 01:18 PM.  Location:  Echo laboratory.  -------------------------------------------------------------------  ------------------------------------------------------------------- Left ventricle:  The cavity size was normal. Wall thickness was increased in a pattern of severe LVH. Systolic function was normal. The estimated ejection fraction was in the range of 55% to 60%. Wall motion was normal; there were no regional wall motion abnormalities. Doppler parameters are consistent with abnormal left ventricular relaxation (grade 1 diastolic dysfunction). Doppler parameters are consistent with high ventricular filling pressure.   ------------------------------------------------------------------- Aortic valve:   Severely calcified annulus. Trileaflet; severely thickened leaflets.  Doppler:   There was severe stenosis.    There was no significant regurgitation.    VTI ratio of LVOT to aortic valve: 0.18. Valve area (VTI): 0.67 cm^2. Indexed valve area (VTI): 0.27 cm^2/m^2. Peak velocity ratio of LVOT to aortic valve: 0.2. Valve area (Vmax): 0.76 cm^2. Indexed valve area (Vmax): 0.31 cm^2/m^2. Mean velocity ratio of LVOT to aortic valve: 0.18. Valve area (Vmean): 0.67 cm^2. Indexed valve area (Vmean): 0.27 cm^2/m^2.    Mean gradient (S): 51 mm Hg. Peak gradient (S): 80 mm Hg.  ------------------------------------------------------------------- Aorta:  Aortic root: The aortic root was normal in size.  ------------------------------------------------------------------- Mitral valve:   Severely calcified annulus.  Doppler:   The findings are consistent with mild stenosis.   There was no significant regurgitation.    Valve area by pressure half-time: 2.18 cm^2. Indexed valve area by pressure half-time: 0.88 cm^2/m^2. Valve area by continuity equation (using LVOT flow): 1.8 cm^2. Indexed valve area by continuity equation (using LVOT flow): 0.73 cm^2/m^2.    Mean gradient (D): 3 mm Hg. Peak gradient (D): 3 mm Hg.  ------------------------------------------------------------------- Left atrium:  The atrium was moderately dilated.  ------------------------------------------------------------------- Atrial septum:  There was increased thickness of the septum, consistent with lipomatous hypertrophy. No defect or patent foramen ovale was identified.  ------------------------------------------------------------------- Right ventricle:  The cavity size was normal. Wall thickness was normal. Systolic function was normal.  ------------------------------------------------------------------- Pulmonic valve:   Not well visualized.  Doppler:   There was  no evidence for stenosis.   There was no significant regurgitation.   ------------------------------------------------------------------- Tricuspid valve:    Normal thickness leaflets.  Doppler:   There was no evidence for stenosis.   There was no significant regurgitation.   ------------------------------------------------------------------- Pulmonary artery:    Systolic pressure could not be accurately estimated.   Inadequate TR jet.  ------------------------------------------------------------------- Right atrium:  The atrium was normal in size.  ------------------------------------------------------------------- Pericardium:  There was no pericardial effusion.  ------------------------------------------------------------------- Systemic veins: Inferior vena cava: The vessel was normal in size. The respirophasic diameter changes were in the normal range (>= 50%), consistent with normal central venous pressure.  ------------------------------------------------------------------- Measurements   Left ventricle                          Value           Reference  LV ID, ED, PLAX chordal         (L)     41.1   mm       43 - 52  LV ID, ES, PLAX chordal                 23.5   mm       23 - 38  LV fx shortening, PLAX chordal          43     %        >=29  LV PW thickness, ED                     15     mm       ----------  IVS/LV PW ratio, ED                     1.01            <=1.3  Stroke volume, 2D                       71     ml       ----------  Stroke volume/bsa, 2D                   29     ml/m^2   ----------  LV e&', lateral                          6.74   cm/s     ----------  LV E/e&', lateral                        13.61           ----------  LV e&', medial                           4.13   cm/s     ----------  LV E/e&', medial                         22.2            ----------  LV e&', average                          5.44   cm/s     ----------  LV E/e&', average  16.87           ----------  LV ejection time                        300    ms       ----------    Ventricular septum                       Value           Reference  IVS thickness, ED                       15.2   mm       ----------    LVOT                                    Value           Reference  LVOT ID, S                              22     mm       ----------  LVOT area                               3.8    cm^2     ----------  LVOT peak velocity, S                   89.04  cm/s     ----------  LVOT mean velocity, S                   59.6   cm/s     ----------  LVOT VTI, S                             17.59  cm       ----------  Stroke volume (SV), LVOT DP             66.9   ml       ----------  Stroke index (SV/bsa), LVOT DP          27     ml/m^2   ----------    Aortic valve                            Value           Reference  Aortic valve peak velocity, S           446.38 cm/s     ----------  Aortic valve mean velocity, S           340.05 cm/s     ----------  Aortic valve VTI, S                     99.38  cm       ----------  Aortic mean gradient, S                 51     mm Hg    ----------  Aortic peak gradient, S  80     mm Hg    ----------  VTI ratio, LVOT/AV                      0.18            ----------  Aortic valve area, VTI                  0.67   cm^2     ----------  Aortic valve area/bsa, VTI              0.27   cm^2/m^2 ----------  Velocity ratio, peak, LVOT/AV           0.2             ----------  Aortic valve area, peak                 0.76   cm^2     ----------  velocity  Aortic valve area/bsa, peak             0.31   cm^2/m^2 ----------  velocity  Velocity ratio, mean, LVOT/AV           0.18            ----------  Aortic valve area, mean                 0.67   cm^2     ----------  velocity  Aortic valve area/bsa, mean             0.27   cm^2/m^2 ----------  velocity    Aorta                                   Value           Reference  Aortic root ID, ED                      35     mm       ----------    Left atrium                             Value           Reference  LA ID,  A-P, ES                          38     mm       ----------  LA ID/bsa, A-P                          1.54   cm/m^2   <=2.2  LA volume, S                            88     ml       ----------  LA volume/bsa, S                        35.6   ml/m^2   ----------  LA volume, ES, 1-p A4C                  82.6   ml       ----------  LA  volume/bsa, ES, 1-p A4C              33.4   ml/m^2   ----------  LA volume, ES, 1-p A2C                  91.4   ml       ----------  LA volume/bsa, ES, 1-p A2C              37     ml/m^2   ----------    Mitral valve                            Value           Reference  Mitral E-wave peak velocity             91.7   cm/s     ----------  Mitral A-wave peak velocity             118    cm/s     ----------  Mitral mean velocity, D                 80.7   cm/s     ----------  Mitral deceleration time        (H)     268    ms       150 - 230  Mitral pressure half-time               101    ms       ----------  Mitral mean gradient, D                 3      mm Hg    ----------  Mitral peak gradient, D                 3      mm Hg    ----------  Mitral E/A ratio, peak                  0.8             ----------  Mitral valve area, PHT, DP              2.18   cm^2     ----------  Mitral valve area/bsa, PHT, DP          0.88   cm^2/m^2 ----------  Mitral valve area, LVOT                 1.8    cm^2     ----------  continuity  Mitral valve area/bsa, LVOT             0.73   cm^2/m^2 ----------  continuity  Mitral annulus VTI, D                   39.2   cm       ----------    Right atrium                            Value           Reference  RA ID, S-I, ES, A4C             (H)     58.9   mm       34 - 49  RA area, ES, A4C  18     cm^2     8.3 - 19.5  RA volume, ES, A/L                      47.5   ml       ----------  RA volume/bsa, ES, A/L                  19.2   ml/m^2   ----------    Systemic veins                          Value           Reference   Estimated CVP                           3      mm Hg    ----------    Right ventricle                         Value           Reference  TAPSE                                   21.4   mm       ----------  RV s&', lateral, S                       14.3   cm/s     ----------  Legend: (L)  and  (H)  mark values outside specified reference range.  ------------------------------------------------------------------- Prepared and Electronically Authenticated by  Patrick Jupiter, M.D. [id number]:59:55   Cardiac TAVR CT  TECHNIQUE: The patient was scanned on a Siemens Force 192 slice scanner. A 120 kV retrospective scan was triggered in the descending thoracic aorta at 111 HU's. Gantry rotation speed was 270 msecs and collimation was .9 mm. No beta blockade or nitro were given. The 3D data set was reconstructed in 5% intervals of the R-R cycle. Systolic and diastolic phases were analyzed on a dedicated work station using MPR, MIP and VRT modes. The patient received 80 cc of contrast.  FINDINGS: Aortic Valve: Trileaflet valve with severely reduced leaflet excursion and moderate calcification.  Aorta: No significant calcifications, conventional 3 vessel branch pattern of the aortic arch with the R brachiocephalic arising slightly proximal on the distal ascending aorta.  Sinotubular Junction: 31 mm  Ascending Thoracic Aorta: 40 mm  Aortic Arch: 30 mm  Descending Thoracic Aorta: 27 mm  Sinus of Valsalva Measurements:  Non-coronary: 34 mm  Right - coronary: 34 mm  Left - coronary: 36 mm  Coronary Artery Height above Annulus:  Left Main: 14.2 mm  Right Coronary: 16.7 mm  Virtual Basal Annulus Measurements:  Maximum/Minimum Diameter: 29 X 23.2 mm  Perimeter: 82.5 mm  Area: 522 mm2  There is a focal calcification in the annulus just below the left coronary cusp.  Coronary Arteries: Normal origin and course. Adequate  coronary height.  Optimum Fluoroscopic Angle for Delivery: LAO 22, CAU 14  No left atrial appendage thrombus, large left atrial appendage.  Severe mitral annular calcification.  IMPRESSION: 1. Trileaflet aortic valve with severely reduced leaflet excursion and moderate calcifications. Annulus area is 522 mm2, which would be suitable for a 26 mm Sapien S3  valve.  2.  Focal annular calcification below the left coronary cusp.  3. Mid ascending aorta dilation, 40 mm in maximal dimension measured in an orthogonal view. Remainder of aorta appears normal size.  4. Optimum Fluoroscopic Angle for Delivery: LAO 22, CAU 14  5. Normal origin and course of coronary arteries with adequate coronary ostial height.  6. No left atrial appendage thrombus.  Weston Brass   Electronically Signed   By: Weston Brass   On: 06/05/2018 17:14   CT ANGIOGRAPHY CHEST, ABDOMEN AND PELVIS  TECHNIQUE: Multidetector CT imaging through the chest, abdomen and pelvis was performed using the standard protocol during bolus administration of intravenous contrast. Multiplanar reconstructed images and MIPs were obtained and reviewed to evaluate the vascular anatomy.  CONTRAST:  ISOVUE-370 IOPAMIDOL (ISOVUE-370) INJECTION 76%  COMPARISON:  CT the abdomen and pelvis 02/09/2018.  FINDINGS: CTA CHEST FINDINGS  Cardiovascular: Heart size is mildly enlarged. There is no significant pericardial fluid, thickening or pericardial calcification. There is aortic atherosclerosis, as well as atherosclerosis of the great vessels of the mediastinum and the coronary arteries, including calcified atherosclerotic plaque in the left anterior descending and right coronary arteries. Severe thickening calcification of the aortic valve. Calcifications of the mitral annulus. Dilatation of the pulmonic trunk (4.1 cm in diameter).  Mediastinum/Lymph Nodes: No pathologically enlarged mediastinal  or hilar lymph nodes. Esophagus is unremarkable in appearance. No axillary lymphadenopathy.  Lungs/Pleura: Patchy areas of ground-glass attenuation and interlobular septal thickening throughout the lungs bilaterally, favored to reflect mild interstitial pulmonary edema. No acute consolidative airspace disease. No pleural effusions. No suspicious appearing pulmonary nodules or masses.  Musculoskeletal/Soft Tissues: There are no aggressive appearing lytic or blastic lesions noted in the visualized portions of the skeleton.  CTA ABDOMEN AND PELVIS FINDINGS  Hepatobiliary: 9 mm low-attenuation lesion in segment 4A of the liver, too small to characterize, but statistically likely to represent a tiny cyst. No other larger more suspicious cystic or solid hepatic lesions. No intra or extrahepatic biliary ductal dilatation. Pneumobilia, presumably from prior sphincterotomy. Numerous partially calcified gallstones filling the gallbladder measuring up to 2 cm in diameter.  Pancreas: No pancreatic mass. No pancreatic ductal dilatation. No pancreatic or peripancreatic fluid or inflammatory changes.  Spleen: Unremarkable.  Adrenals/Urinary Tract: In the medial aspect of the upper pole the left kidney there is a 2.4 cm exophytic low-attenuation lesion compatible with a simple cyst. In the lateral aspect of the lower pole of the right kidney there is a 1.9 cm lesion which demonstrates some potential peripheral mural nodularity and enhancement (axial image 114 of series 15). Mild bilateral renal atrophy. No hydroureteronephrosis. Urinary bladder is normal in appearance. Bilateral adrenal glands are normal in appearance.  Stomach/Bowel: Normal appearance of the stomach. No pathologic dilatation of small bowel or colon. Normal appendix.  Vascular/Lymphatic: Aortic atherosclerosis, with vascular findings and measurements pertinent to potential TAVR procedure, as detailed below. No  aneurysm or dissection noted in the abdominal or pelvic vasculature. No lymphadenopathy noted in the abdomen or pelvis.  Reproductive: Uterus and ovaries are unremarkable in appearance.  Other: No significant volume of ascites.  No pneumoperitoneum.  Musculoskeletal: There are no aggressive appearing lytic or blastic lesions noted in the visualized portions of the skeleton.  VASCULAR MEASUREMENTS PERTINENT TO TAVR:  AORTA:  Minimal Aortic Diameter-20 x 19 mm  Severity of Aortic Calcification-mild  RIGHT PELVIS:  Right Common Iliac Artery -  Minimal Diameter-12.3 x 11.1 mm  Tortuosity-severe  Calcification-mild  Right External Iliac Artery -  Minimal Diameter-9.8 x 9.2 mm  Tortuosity - severe  Calcification-none  Right Common Femoral Artery -  Minimal Diameter-9.4 x 9.7 mm  Tortuosity-mild  Calcification-none  LEFT PELVIS:  Left Common Iliac Artery -  Minimal Diameter-16.8 x 9.2 mm  Tortuosity - severe  Calcification-mild  Left External Iliac Artery -  Minimal Diameter-9.3 x 8.3 mm  Tortuosity - severe  Calcification-none  Left Common Femoral Artery -  Minimal Diameter-10.3 x 10.0 mm  Tortuosity-mild  Calcification-none  Review of the MIP images confirms the above findings.  IMPRESSION: 1. Vascular findings and measurements pertinent to potential TAVR procedure, as detailed above. 2. Severe thickening calcification of the aortic valve, compatible with the reported clinical history of severe aortic stenosis. 3. Aortic atherosclerosis, in addition to 2 vessel coronary artery disease. Assessment for potential risk factor modification, dietary therapy or pharmacologic therapy may be warranted, if clinically indicated. 4. Dilatation of the pulmonic trunk (4.1 cm in diameter), concerning for pulmonary arterial hypertension. 5. 1.9 cm lesion in the periphery of the lower pole the right kidney which may have  some enhancing mural nodularity. 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 6. Cholelithiasis. 7. Additional incidental findings, as above.   Electronically Signed   By: Trudie Reed M.D.   On: 06/03/2018 10:40     Impression:  Patient has stage D severe symptomatic aortic stenosis.  She describes stable symptoms of exertional shortness of breath and fatigue consistent with chronic diastolic congestive heart failure, New York Heart Association functional class IIb-III. I suspect the patient would be much more symptomatic if she were more able to get around physically, but she is extremely limited with very poor mobility because of longstanding morbid obesity and severe degenerative arthritis.  I have personally reviewed the patient's most recent transthoracic echocardiogram and CT angiograms.  There is normal left ventricular systolic function.  The aortic valve is trileaflet.  There is severe thickening, calcification, and restricted leaflet mobility involving all 3 leaflets of the aortic valve.  1 of the 3 leaflets moves a little bit better than the other 2 which are essentially immobile.  Peak velocity across the aortic valve measured close to 4.5 m/s corresponding to mean transvalvular gradient estimated 51 mmHg.  The DVI was notably only 0.18 with aortic valve area calculated 0.67 cm.  By report, diagnostic cardiac catheterization performed February 2019 revealed no significant coronary artery disease and confirmed the presence of severe aortic stenosis with mean transvalvular gradient measured 45 mmHg at catheterization.  Pulmonary artery pressures were mildly elevated.  Although the patient's risks associated with conventional surgical aortic valve replacement would only be mild to moderately elevated using the STS  risk calculator, I would not consider this patient a candidate for conventional surgery because of her extremely limited physical mobility and poor functional status.  Moreover, she has had 2 recent hospitalizations for sepsis related to acute cholecystitis and choledocholithiasis.  She will need to have her gallbladder removed as soon as practical once her aortic stenosis has been treated.    Cardiac-gated CTA of the heart reveals anatomical characteristics consistent with aortic stenosis suitable for treatment by transcatheter aortic valve replacement without any significant complicating features, although there is one area of calcification in the aortic annulus which might slightly increase the risk of paravalvular leak.  CTA of the aorta and iliac vessels demonstrate  what appears to be adequate pelvic vascular access to facilitate a transfemoral approach.  The patient has right bundle branch block at baseline on her recent EKG which will put her at increased risk for the development of complete heart block postoperatively.    Plan:  The patient and her daughter were counseled at length regarding treatment alternatives for management of severe symptomatic aortic stenosis. Alternative approaches such as conventional aortic valve replacement, transcatheter aortic valve replacement, and continued medical therapy without intervention were compared and contrasted at length.  The risks associated with conventional surgical aortic valve replacement were discussed in detail, as were expectations for post-operative convalescence, and why I would be reluctant to consider this patient a candidate for conventional surgery.  Issues specific to transcatheter aortic valve replacement were discussed including questions about long term valve durability, the potential for paravalvular leak, possible increased risk of need for permanent pacemaker placement, and other technical complications related to the procedure itself.   Long-term prognosis with medical therapy was discussed. This discussion was placed in the context of the patient's own specific clinical presentation and past medical history.  All of their questions have been addressed.  The patient desires to proceed with transcatheter aortic valve replacement as soon as possible.  We tentatively plan for surgery on July 09, 2018.  Following the decision to proceed with transcatheter aortic valve replacement, a discussion has been held regarding what types of management strategies would be attempted intraoperatively in the event of life-threatening complications, including the fact that the patient would not be considered a candidate for conversion to open sternotomy for attempted surgical intervention.  The patient has been advised of a variety of complications that might develop including but not limited to risks of death, stroke, paravalvular leak, aortic dissection or other major vascular complications, aortic annulus rupture, device embolization, cardiac rupture or perforation, mitral regurgitation, acute myocardial infarction, arrhythmia, heart block or bradycardia requiring permanent pacemaker placement, congestive heart failure, respiratory failure, renal failure, pneumonia, infection, other late complications related to structural valve deterioration or migration, or other complications that might ultimately cause a temporary or permanent loss of functional independence or other long term morbidity.  The patient provides full informed consent for the procedure as described and all questions were answered.   I spent in excess of 90 minutes during the conduct of this office consultation and >50% of this time involved direct face-to-face encounter with the patient for counseling and/or coordination of their care.     Salvatore Decent. Cornelius Moras, MD 07/01/2018 12:14 PM    The patient's diagnostic cardiac catheterization films from [facility] in  February 2019 have been located.  I have had the opportunity to review these films.  I agree with the report as notated previously.  There does not appear to be any significant coronary artery disease.  The patient does have significant mitral annular calcification.  Purcell Nails, MD 07/01/2018 5:01 PM

## 2018-07-02 ENCOUNTER — Other Ambulatory Visit: Payer: Self-pay

## 2018-07-02 DIAGNOSIS — I350 Nonrheumatic aortic (valve) stenosis: Secondary | ICD-10-CM

## 2018-07-03 DIAGNOSIS — E661 Drug-induced obesity: Secondary | ICD-10-CM | POA: Diagnosis not present

## 2018-07-03 DIAGNOSIS — M6281 Muscle weakness (generalized): Secondary | ICD-10-CM | POA: Diagnosis not present

## 2018-07-05 ENCOUNTER — Other Ambulatory Visit: Payer: Self-pay

## 2018-07-05 ENCOUNTER — Encounter (HOSPITAL_COMMUNITY)
Admission: RE | Admit: 2018-07-05 | Discharge: 2018-07-05 | Disposition: A | Discharge status: Home / Self Care | Payer: Medicare PPO | Source: Ambulatory Visit | Attending: Cardiovascular Disease | Admitting: Cardiovascular Disease

## 2018-07-05 ENCOUNTER — Encounter (HOSPITAL_COMMUNITY): Payer: Self-pay

## 2018-07-05 ENCOUNTER — Ambulatory Visit (HOSPITAL_COMMUNITY)
Admission: RE | Admit: 2018-07-05 | Discharge: 2018-07-05 | Disposition: A | Discharge status: Home / Self Care | Payer: Medicare PPO | Source: Ambulatory Visit | Attending: Cardiovascular Disease | Admitting: Cardiovascular Disease

## 2018-07-05 DIAGNOSIS — I4510 Unspecified right bundle-branch block: Secondary | ICD-10-CM | POA: Diagnosis not present

## 2018-07-05 DIAGNOSIS — I517 Cardiomegaly: Secondary | ICD-10-CM | POA: Diagnosis not present

## 2018-07-05 DIAGNOSIS — I350 Nonrheumatic aortic (valve) stenosis: Secondary | ICD-10-CM

## 2018-07-05 DIAGNOSIS — R9431 Abnormal electrocardiogram [ECG] [EKG]: Secondary | ICD-10-CM | POA: Diagnosis not present

## 2018-07-05 DIAGNOSIS — Z01818 Encounter for other preprocedural examination: Secondary | ICD-10-CM | POA: Diagnosis not present

## 2018-07-05 LAB — ABO/RH: ABO/RH(D): O NEG

## 2018-07-05 LAB — BLOOD GAS, ARTERIAL
Acid-Base Excess: 2.1 mmol/L — ABNORMAL HIGH (ref 0.0–2.0)
Bicarbonate: 25.7 mmol/L (ref 20.0–28.0)
Drawn by: 421801
FIO2: 21
O2 Saturation: 94.3 %
Patient temperature: 98.6
pCO2 arterial: 36.9 mmHg (ref 32.0–48.0)
pH, Arterial: 7.457 — ABNORMAL HIGH (ref 7.350–7.450)
pO2, Arterial: 67.8 mmHg — ABNORMAL LOW (ref 83.0–108.0)

## 2018-07-05 LAB — CBC
HCT: 42.5 % (ref 36.0–46.0)
Hemoglobin: 12.9 g/dL (ref 12.0–15.0)
MCH: 28.7 pg (ref 26.0–34.0)
MCHC: 30.4 g/dL (ref 30.0–36.0)
MCV: 94.7 fL (ref 80.0–100.0)
Platelets: 219 10*3/uL (ref 150–400)
RBC: 4.49 MIL/uL (ref 3.87–5.11)
RDW: 14.8 % (ref 11.5–15.5)
WBC: 6.8 10*3/uL (ref 4.0–10.5)
nRBC: 0 % (ref 0.0–0.2)

## 2018-07-05 LAB — PROTIME-INR
INR: 1 (ref 0.8–1.2)
Prothrombin Time: 13.3 seconds (ref 11.4–15.2)

## 2018-07-05 LAB — URINALYSIS, ROUTINE W REFLEX MICROSCOPIC
Bilirubin Urine: NEGATIVE
Glucose, UA: NEGATIVE mg/dL
Hgb urine dipstick: NEGATIVE
Ketones, ur: NEGATIVE mg/dL
Leukocytes,Ua: NEGATIVE
Nitrite: NEGATIVE
Protein, ur: NEGATIVE mg/dL
Specific Gravity, Urine: 1.018 (ref 1.005–1.030)
pH: 6 (ref 5.0–8.0)

## 2018-07-05 LAB — COMPREHENSIVE METABOLIC PANEL
ALT: 13 U/L (ref 0–44)
AST: 18 U/L (ref 15–41)
Albumin: 3.6 g/dL (ref 3.5–5.0)
Alkaline Phosphatase: 75 U/L (ref 38–126)
Anion gap: 9 (ref 5–15)
BUN: 28 mg/dL — ABNORMAL HIGH (ref 8–23)
CO2: 24 mmol/L (ref 22–32)
Calcium: 9.4 mg/dL (ref 8.9–10.3)
Chloride: 105 mmol/L (ref 98–111)
Creatinine, Ser: 0.74 mg/dL (ref 0.44–1.00)
GFR calc Af Amer: >60 mL/min (ref >60)
GFR calc non Af Amer: >60 mL/min (ref >60)
Glucose, Bld: 99 mg/dL (ref 70–99)
Potassium: 4 mmol/L (ref 3.5–5.1)
Sodium: 138 mmol/L (ref 135–145)
Total Bilirubin: 1.1 mg/dL (ref 0.3–1.2)
Total Protein: 6.9 g/dL (ref 6.5–8.1)

## 2018-07-05 LAB — APTT: aPTT: 35 seconds (ref 24–36)

## 2018-07-05 LAB — HEMOGLOBIN A1C
Hgb A1c MFr Bld: 5.5 % (ref 4.8–5.6)
Mean Plasma Glucose: 111.15 mg/dL

## 2018-07-05 LAB — BRAIN NATRIURETIC PEPTIDE: B Natriuretic Peptide: 174.4 pg/mL — ABNORMAL HIGH (ref 0.0–100.0)

## 2018-07-05 LAB — TYPE AND SCREEN
ABO/RH(D): O NEG
Antibody Screen: NEGATIVE

## 2018-07-05 LAB — SURGICAL PCR SCREEN
MRSA, PCR: NEGATIVE
Staphylococcus aureus: POSITIVE — AB

## 2018-07-05 NOTE — Progress Notes (Signed)
PCP -  Dr. Catalina Pizza  LOV 05/2018   Cardiologist - Dr. Sanjuana Kava  LOV 05/2018  Chest x-ray - 07/05/2018 EKG -  07/05/2018  Stress Test -   ECHO - 2019 Cardiac Cath -  2019  Sleep Study - couple of months ago.  "borderline" waiting on ? CPAP use. CPAP -   Fasting Blood Sugar - denies  Checks Blood Sugar _____ times a day  Blood Thinner Instructions: per Dr. Randolm Idol instructions Aspirin Instructions:  Was just here for teeth extraction, nothing has changed. She had tested positive for staph earlier, so she still has the mupiricion.  Anesthesia review:   Patient denies shortness of breath, fever, cough and chest pain at PAT appointment  Patient verbalized understanding of instructions that were given to them at the PAT appointment. Patient was also instructed that they will need to review over the PAT instructions again at home before surgery.

## 2018-07-05 NOTE — Pre-Procedure Instructions (Signed)
[facility]  07/05/2018      [id number] [place], [place] - [address] [place] [address] Phone: [phone] Fax: [phone]    Your procedure is scheduled on Tuesday, March 10th   Report to [facility] Admitting at 5:30am             (posted procedural time 7:30a - 9:27a)   Call this number if you have problems the morning of surgery:  [phone]   Remember:   Do not eat any foods or drink any liquids after midnight, Monday.              5 days prior to surgery, STOP TAKING any Vitamins, Herbal Supplements, Anti-inflammatories.    Take these medicines the morning of surgery with A SIP OF WATER : Gabapentin, Effexor.    Do not wear jewelry, make-up or nail polish.  Do not wear lotions, powders, or perfumes, or deodorant.  Do not shave 48 hours prior to surgery.   Do not bring valuables to the hospital.  [facility] is not responsible for any belongings or valuables.  Contacts, dentures or bridgework may not be worn into surgery.  Leave your suitcase in the car.  After surgery it may be brought to your room.  For patients admitted to the hospital, discharge time will be determined by your treatment team.  Please read over the following fact sheets that you were given. MRSA Information and Surgical Site Infection Prevention      [facility]- Preparing For Surgery  Before surgery, you can play an important role. Because skin is not sterile, your skin needs to be as free of germs as possible. You can reduce the number of germs on your skin by washing with CHG (chlorahexidine gluconate) Soap before surgery.  CHG is an antiseptic cleaner which kills germs and bonds with the skin to continue killing germs even after washing.    Oral Hygiene is also important to reduce your risk of infection.    Remember - BRUSH YOUR TEETH THE MORNING OF SURGERY WITH YOUR REGULAR TOOTHPASTE  Please  do not use if you have an allergy to CHG or antibacterial soaps. If your skin becomes reddened/irritated stop using the CHG.  Do not shave (including legs and underarms) for at least 48 hours prior to first CHG shower. It is OK to shave your face.  Please follow these instructions carefully.   1. Shower the NIGHT BEFORE SURGERY and the MORNING OF SURGERY with CHG.   2. If you chose to wash your hair, wash your hair first as usual with your normal shampoo.  3. After you shampoo, rinse your hair and body thoroughly to remove the shampoo.  4. Use CHG as you would any other liquid soap. You can apply CHG directly to the skin and wash gently with a scrungie or a clean washcloth.   5. Apply the CHG Soap to your body ONLY FROM THE NECK DOWN.  Do not use on open wounds or open sores. Avoid contact with your eyes, ears, mouth and genitals (private parts). Wash Face and genitals (private parts)  with your normal soap.  6. Wash thoroughly, paying special attention to the area where your surgery will be performed.  7. Thoroughly rinse your body with warm water from the neck down.  8. DO NOT shower/wash with your normal soap after using and rinsing off the CHG Soap.  9.  Pat yourself dry with a CLEAN TOWEL.  10. Wear CLEAN PAJAMAS to bed the night before surgery, wear comfortable clothes the morning of surgery  11. Place CLEAN SHEETS on your bed the night of your first shower and DO NOT SLEEP WITH PETS.  Day of Surgery:  Do not apply any deodorants/lotions.  Please wear clean clothes to the hospital/surgery center.    Remember to brush your teeth WITH YOUR REGULAR TOOTHPASTE.    PLEASE RE-READ OVER THIS INFORMATION!

## 2018-07-08 MED ORDER — MAGNESIUM SULFATE 50 % IJ SOLN
40.0000 meq | INTRAMUSCULAR | Status: DC
  Filled 2018-07-08: qty 9.85

## 2018-07-08 MED ORDER — NOREPINEPHRINE BITARTRATE 1 MG/ML IV SOLN
0.0000 ug/min | INTRAVENOUS | Status: DC
  Filled 2018-07-08: qty 4

## 2018-07-08 MED ORDER — DEXMEDETOMIDINE HCL IN NACL 400 MCG/100ML IV SOLN
0.1000 ug/kg/h | INTRAVENOUS | Status: AC
  Administered 2018-07-09: 1 ug/kg/h via INTRAVENOUS
  Filled 2018-07-08: qty 100

## 2018-07-08 MED ORDER — SODIUM CHLORIDE 0.9 % IV SOLN
1.5000 g | INTRAVENOUS | Status: AC
  Administered 2018-07-09: 1.5 g via INTRAVENOUS
  Filled 2018-07-08: qty 1.5

## 2018-07-08 MED ORDER — SODIUM CHLORIDE 0.9 % IV SOLN
INTRAVENOUS | Status: DC
  Filled 2018-07-08: qty 30

## 2018-07-08 MED ORDER — POTASSIUM CHLORIDE 2 MEQ/ML IV SOLN
80.0000 meq | INTRAVENOUS | Status: DC
  Filled 2018-07-08: qty 40

## 2018-07-08 MED ORDER — VANCOMYCIN HCL 10 G IV SOLR
1500.0000 mg | INTRAVENOUS | Status: AC
  Administered 2018-07-09: 1500 mg via INTRAVENOUS
  Filled 2018-07-08: qty 1500

## 2018-07-08 NOTE — Anesthesia Preprocedure Evaluation (Addendum)
Anesthesia Evaluation  Patient identified by MRN, date of birth, ID band Patient awake    Reviewed: Allergy & Precautions, NPO status , Patient's Chart, lab work & pertinent test results  Airway Mallampati: II  TM Distance: >3 FB Neck ROM: Full    Dental  (+) Edentulous Upper, Edentulous Lower   Pulmonary former smoker,    Pulmonary exam normal breath sounds clear to auscultation       Cardiovascular hypertension, +CHF  + Valvular Problems/Murmurs AS  Rhythm:Regular Rate:Normal + Systolic murmurs Referring Provider is Satira Sark, MD Primary Cardiologist is Lauree Chandler, MD PCP is [name], MD   Neuro/Psych PSYCHIATRIC DISORDERS Anxiety Depression TIACVA, No Residual Symptoms    GI/Hepatic negative GI ROS, Neg liver ROS,   Endo/Other  Morbid obesity  Renal/GU negative Renal ROS     Musculoskeletal negative musculoskeletal ROS (+)   Abdominal (+) + obese,   Peds  Hematology HLD   Anesthesia Other Findings Severe Aortic Stenosis  Reproductive/Obstetrics                            Anesthesia Physical Anesthesia Plan  ASA: IV  Anesthesia Plan: MAC   Post-op Pain Management:    Induction: Intravenous  PONV Risk Score and Plan: 2 and Ondansetron, Dexamethasone and Treatment may vary due to age or medical condition  Airway Management Planned: Simple Face Mask  Additional Equipment: Arterial line, CVP and Ultrasound Guidance Line Placement  Intra-op Plan:   Post-operative Plan:   Informed Consent: I have reviewed the patients History and Physical, chart, labs and discussed the procedure including the risks, benefits and alternatives for the proposed anesthesia with the patient or authorized representative who has indicated his/her understanding and acceptance.       Plan Discussed with: CRNA  Anesthesia Plan Comments:        Anesthesia Quick  Evaluation

## 2018-07-09 ENCOUNTER — Inpatient Hospital Stay (HOSPITAL_COMMUNITY): Payer: Medicare PPO | Admitting: Physician Assistant

## 2018-07-09 ENCOUNTER — Inpatient Hospital Stay (HOSPITAL_COMMUNITY): Payer: Medicare PPO | Admitting: Anesthesiology

## 2018-07-09 ENCOUNTER — Inpatient Hospital Stay (HOSPITAL_COMMUNITY): Payer: Medicare PPO

## 2018-07-09 ENCOUNTER — Other Ambulatory Visit: Payer: Self-pay

## 2018-07-09 ENCOUNTER — Inpatient Hospital Stay (HOSPITAL_COMMUNITY)
Admission: RE | Admit: 2018-07-09 | Discharge: 2018-07-11 | DRG: 267 | Disposition: A | Discharge status: Home / Self Care | Payer: Medicare PPO | Attending: Thoracic Surgery (Cardiothoracic Vascular Surgery) | Admitting: Thoracic Surgery (Cardiothoracic Vascular Surgery)

## 2018-07-09 ENCOUNTER — Encounter (HOSPITAL_COMMUNITY)
Admission: RE | Disposition: A | Discharge status: Home / Self Care | Payer: Self-pay | Source: Home / Self Care | Attending: Thoracic Surgery (Cardiothoracic Vascular Surgery)

## 2018-07-09 ENCOUNTER — Encounter (HOSPITAL_COMMUNITY): Payer: Self-pay | Admitting: Physician Assistant

## 2018-07-09 ENCOUNTER — Other Ambulatory Visit (HOSPITAL_BASED_OUTPATIENT_CLINIC_OR_DEPARTMENT_OTHER): Payer: Self-pay

## 2018-07-09 DIAGNOSIS — Z8673 Personal history of transient ischemic attack (TIA), and cerebral infarction without residual deficits: Secondary | ICD-10-CM

## 2018-07-09 DIAGNOSIS — Z87891 Personal history of nicotine dependence: Secondary | ICD-10-CM | POA: Diagnosis not present

## 2018-07-09 DIAGNOSIS — I509 Heart failure, unspecified: Secondary | ICD-10-CM | POA: Diagnosis not present

## 2018-07-09 DIAGNOSIS — Z8261 Family history of arthritis: Secondary | ICD-10-CM

## 2018-07-09 DIAGNOSIS — I10 Essential (primary) hypertension: Secondary | ICD-10-CM | POA: Diagnosis present

## 2018-07-09 DIAGNOSIS — Z6841 Body Mass Index (BMI) 40.0 and over, adult: Secondary | ICD-10-CM | POA: Diagnosis not present

## 2018-07-09 DIAGNOSIS — F419 Anxiety disorder, unspecified: Secondary | ICD-10-CM | POA: Diagnosis present

## 2018-07-09 DIAGNOSIS — Z006 Encounter for examination for normal comparison and control in clinical research program: Secondary | ICD-10-CM | POA: Diagnosis not present

## 2018-07-09 DIAGNOSIS — F329 Major depressive disorder, single episode, unspecified: Secondary | ICD-10-CM | POA: Diagnosis present

## 2018-07-09 DIAGNOSIS — Z8679 Personal history of other diseases of the circulatory system: Secondary | ICD-10-CM

## 2018-07-09 DIAGNOSIS — R001 Bradycardia, unspecified: Secondary | ICD-10-CM | POA: Diagnosis not present

## 2018-07-09 DIAGNOSIS — G4733 Obstructive sleep apnea (adult) (pediatric): Secondary | ICD-10-CM | POA: Diagnosis present

## 2018-07-09 DIAGNOSIS — N289 Disorder of kidney and ureter, unspecified: Secondary | ICD-10-CM

## 2018-07-09 DIAGNOSIS — Z452 Encounter for adjustment and management of vascular access device: Secondary | ICD-10-CM | POA: Diagnosis not present

## 2018-07-09 DIAGNOSIS — I872 Venous insufficiency (chronic) (peripheral): Secondary | ICD-10-CM | POA: Diagnosis present

## 2018-07-09 DIAGNOSIS — G4730 Sleep apnea, unspecified: Secondary | ICD-10-CM | POA: Diagnosis present

## 2018-07-09 DIAGNOSIS — Z79899 Other long term (current) drug therapy: Secondary | ICD-10-CM

## 2018-07-09 DIAGNOSIS — I313 Pericardial effusion (noninflammatory): Secondary | ICD-10-CM | POA: Diagnosis not present

## 2018-07-09 DIAGNOSIS — E785 Hyperlipidemia, unspecified: Secondary | ICD-10-CM | POA: Diagnosis present

## 2018-07-09 DIAGNOSIS — I4510 Unspecified right bundle-branch block: Secondary | ICD-10-CM | POA: Diagnosis present

## 2018-07-09 DIAGNOSIS — I350 Nonrheumatic aortic (valve) stenosis: Secondary | ICD-10-CM | POA: Diagnosis not present

## 2018-07-09 DIAGNOSIS — I442 Atrioventricular block, complete: Secondary | ICD-10-CM | POA: Diagnosis present

## 2018-07-09 DIAGNOSIS — Z952 Presence of prosthetic heart valve: Secondary | ICD-10-CM

## 2018-07-09 DIAGNOSIS — I2510 Atherosclerotic heart disease of native coronary artery without angina pectoris: Secondary | ICD-10-CM | POA: Diagnosis not present

## 2018-07-09 DIAGNOSIS — I878 Other specified disorders of veins: Secondary | ICD-10-CM | POA: Diagnosis present

## 2018-07-09 DIAGNOSIS — Z8249 Family history of ischemic heart disease and other diseases of the circulatory system: Secondary | ICD-10-CM

## 2018-07-09 DIAGNOSIS — Z86718 Personal history of other venous thrombosis and embolism: Secondary | ICD-10-CM

## 2018-07-09 DIAGNOSIS — I5032 Chronic diastolic (congestive) heart failure: Secondary | ICD-10-CM | POA: Diagnosis not present

## 2018-07-09 DIAGNOSIS — E669 Obesity, unspecified: Secondary | ICD-10-CM | POA: Diagnosis present

## 2018-07-09 DIAGNOSIS — M1990 Unspecified osteoarthritis, unspecified site: Secondary | ICD-10-CM | POA: Diagnosis present

## 2018-07-09 DIAGNOSIS — Z950 Presence of cardiac pacemaker: Secondary | ICD-10-CM

## 2018-07-09 DIAGNOSIS — I110 Hypertensive heart disease with heart failure: Secondary | ICD-10-CM | POA: Diagnosis present

## 2018-07-09 DIAGNOSIS — I371 Nonrheumatic pulmonary valve insufficiency: Secondary | ICD-10-CM | POA: Diagnosis present

## 2018-07-09 DIAGNOSIS — G629 Polyneuropathy, unspecified: Secondary | ICD-10-CM | POA: Diagnosis present

## 2018-07-09 DIAGNOSIS — Z7982 Long term (current) use of aspirin: Secondary | ICD-10-CM

## 2018-07-09 DIAGNOSIS — Z954 Presence of other heart-valve replacement: Secondary | ICD-10-CM | POA: Diagnosis not present

## 2018-07-09 DIAGNOSIS — E782 Mixed hyperlipidemia: Secondary | ICD-10-CM | POA: Diagnosis present

## 2018-07-09 DIAGNOSIS — R5381 Other malaise: Secondary | ICD-10-CM | POA: Diagnosis present

## 2018-07-09 HISTORY — DX: Nonrheumatic aortic (valve) stenosis: I35.0

## 2018-07-09 HISTORY — DX: Personal history of transient ischemic attack (TIA), and cerebral infarction without residual deficits: Z86.73

## 2018-07-09 HISTORY — DX: Presence of prosthetic heart valve: Z95.2

## 2018-07-09 HISTORY — DX: Other malaise: R53.81

## 2018-07-09 HISTORY — PX: TRANSCATHETER AORTIC VALVE REPLACEMENT, TRANSFEMORAL: SHX6400

## 2018-07-09 HISTORY — DX: Disorder of kidney and ureter, unspecified: N28.9

## 2018-07-09 LAB — POCT I-STAT 4, (NA,K, GLUC, HGB,HCT)
Glucose, Bld: 101 mg/dL — ABNORMAL HIGH (ref 70–99)
Glucose, Bld: 130 mg/dL — ABNORMAL HIGH (ref 70–99)
Glucose, Bld: 118 mg/dL — ABNORMAL HIGH (ref 70–99)
HCT: 36 % (ref 36.0–46.0)
HCT: 35 % — ABNORMAL LOW (ref 36.0–46.0)
HCT: 34 % — ABNORMAL LOW (ref 36.0–46.0)
Hemoglobin: 12.2 g/dL (ref 12.0–15.0)
Hemoglobin: 11.9 g/dL — ABNORMAL LOW (ref 12.0–15.0)
Hemoglobin: 11.6 g/dL — ABNORMAL LOW (ref 12.0–15.0)
Potassium: 3.8 mmol/L (ref 3.5–5.1)
Potassium: 4.3 mmol/L (ref 3.5–5.1)
Potassium: 4.3 mmol/L (ref 3.5–5.1)
Sodium: 141 mmol/L (ref 135–145)
Sodium: 141 mmol/L (ref 135–145)
Sodium: 141 mmol/L (ref 135–145)

## 2018-07-09 LAB — POCT ACTIVATED CLOTTING TIME
Activated Clotting Time: 142 seconds
Activated Clotting Time: 131 seconds
Activated Clotting Time: 279 seconds

## 2018-07-09 SURGERY — IMPLANTATION, AORTIC VALVE, TRANSCATHETER, FEMORAL APPROACH
Anesthesia: Monitor Anesthesia Care

## 2018-07-09 SURGERY — Surgical Case

## 2018-07-09 MED ORDER — SODIUM CHLORIDE 0.9 % IV SOLN
INTRAVENOUS | Status: AC
  Filled 2018-07-09 (×3): qty 1.2

## 2018-07-09 MED ORDER — PRAVASTATIN SODIUM 10 MG PO TABS
20.0000 mg | ORAL_TABLET | Freq: Every day | ORAL | Status: DC
  Administered 2018-07-10 (×2): 20 mg via ORAL
  Filled 2018-07-09 (×2): qty 2

## 2018-07-09 MED ORDER — VENLAFAXINE HCL 37.5 MG PO TABS
37.5000 mg | ORAL_TABLET | Freq: Every day | ORAL | Status: DC
  Administered 2018-07-10 – 2018-07-11 (×2): 37.5 mg via ORAL
  Filled 2018-07-09 (×2): qty 1

## 2018-07-09 MED ORDER — LIDOCAINE HCL 1 % IJ SOLN
INTRAMUSCULAR | Status: AC
  Filled 2018-07-09: qty 20

## 2018-07-09 MED ORDER — HEPARIN SODIUM (PORCINE) 1000 UNIT/ML IJ SOLN
INTRAMUSCULAR | Status: AC
  Filled 2018-07-09: qty 1

## 2018-07-09 MED ORDER — LIDOCAINE HCL (PF) 1 % IJ SOLN
INTRAMUSCULAR | Status: AC
  Filled 2018-07-09: qty 30

## 2018-07-09 MED ORDER — CHLORHEXIDINE GLUCONATE 0.12 % MT SOLN
OROMUCOSAL | Status: AC
  Administered 2018-07-09: 15 mL via OROMUCOSAL
  Filled 2018-07-09: qty 15

## 2018-07-09 MED ORDER — HEPARIN (PORCINE) IN NACL 1000-0.9 UT/500ML-% IV SOLN
INTRAVENOUS | Status: DC | PRN
  Administered 2018-07-09 (×3): 500 mL

## 2018-07-09 MED ORDER — FENTANYL CITRATE (PF) 250 MCG/5ML IJ SOLN
INTRAMUSCULAR | Status: DC | PRN
  Administered 2018-07-09: 50 ug via INTRAVENOUS

## 2018-07-09 MED ORDER — SODIUM CHLORIDE 0.9% FLUSH
3.0000 mL | Freq: Two times a day (BID) | INTRAVENOUS | Status: DC
  Administered 2018-07-10 – 2018-07-11 (×3): 3 mL via INTRAVENOUS

## 2018-07-09 MED ORDER — HEPARIN (PORCINE) IN NACL 1000-0.9 UT/500ML-% IV SOLN
INTRAVENOUS | Status: AC
  Filled 2018-07-09: qty 500

## 2018-07-09 MED ORDER — PROPOFOL 10 MG/ML IV BOLUS
INTRAVENOUS | Status: AC
  Filled 2018-07-09: qty 20

## 2018-07-09 MED ORDER — CHLORHEXIDINE GLUCONATE 4 % EX LIQD: 60.0000 mL | Freq: Once | CUTANEOUS | Status: DC

## 2018-07-09 MED ORDER — LACTATED RINGERS IV SOLN
INTRAVENOUS | Status: DC | PRN
  Administered 2018-07-09: 08:00:00 via INTRAVENOUS

## 2018-07-09 MED ORDER — METOPROLOL TARTRATE 5 MG/5ML IV SOLN: 2.5000 mg | INTRAVENOUS | Status: DC | PRN

## 2018-07-09 MED ORDER — SODIUM CHLORIDE 0.9 % IV SOLN
INTRAVENOUS | Status: AC
  Administered 2018-07-09: 1000 mL via INTRAVENOUS

## 2018-07-09 MED ORDER — PROTAMINE SULFATE 10 MG/ML IV SOLN
INTRAVENOUS | Status: DC | PRN
  Administered 2018-07-09: 190 mg via INTRAVENOUS

## 2018-07-09 MED ORDER — PROPOFOL 10 MG/ML IV BOLUS
INTRAVENOUS | Status: DC | PRN
  Administered 2018-07-09: 20 mg via INTRAVENOUS

## 2018-07-09 MED ORDER — IOPAMIDOL (ISOVUE-370) INJECTION 76%
INTRAVENOUS | Status: DC | PRN
  Administered 2018-07-09: 40 mL via INTRA_ARTERIAL

## 2018-07-09 MED ORDER — MORPHINE SULFATE (PF) 2 MG/ML IV SOLN: 1.0000 mg | INTRAVENOUS | Status: DC | PRN

## 2018-07-09 MED ORDER — NITROGLYCERIN IN D5W 200-5 MCG/ML-% IV SOLN: 0.0000 ug/min | INTRAVENOUS | Status: DC

## 2018-07-09 MED ORDER — LIDOCAINE HCL (PF) 1 % IJ SOLN
INTRAMUSCULAR | Status: DC | PRN
  Administered 2018-07-09 (×2): 15 mL via INTRADERMAL

## 2018-07-09 MED ORDER — ACETAMINOPHEN 325 MG PO TABS: 650.0000 mg | ORAL_TABLET | Freq: Four times a day (QID) | ORAL | Status: DC | PRN

## 2018-07-09 MED ORDER — VANCOMYCIN HCL IN DEXTROSE 1-5 GM/200ML-% IV SOLN
1000.0000 mg | Freq: Once | INTRAVENOUS | Status: AC
  Administered 2018-07-09: 1000 mg via INTRAVENOUS
  Filled 2018-07-09: qty 200

## 2018-07-09 MED ORDER — FERROUS SULFATE 325 (65 FE) MG PO TABS
325.0000 mg | ORAL_TABLET | Freq: Every day | ORAL | Status: DC
  Administered 2018-07-10 – 2018-07-11 (×2): 325 mg via ORAL
  Filled 2018-07-09 (×2): qty 1

## 2018-07-09 MED ORDER — MIDAZOLAM HCL 2 MG/2ML IJ SOLN
INTRAMUSCULAR | Status: AC
  Filled 2018-07-09: qty 2

## 2018-07-09 MED ORDER — ONDANSETRON HCL 4 MG/2ML IJ SOLN: 4.0000 mg | Freq: Four times a day (QID) | INTRAMUSCULAR | Status: DC | PRN

## 2018-07-09 MED ORDER — DEXAMETHASONE SODIUM PHOSPHATE 10 MG/ML IJ SOLN
INTRAMUSCULAR | Status: DC | PRN
  Administered 2018-07-09: 4 mg via INTRAVENOUS

## 2018-07-09 MED ORDER — NOREPINEPHRINE BITARTRATE 1 MG/ML IV SOLN
INTRAVENOUS | Status: DC | PRN
  Administered 2018-07-09: 2 ug/min via INTRAVENOUS

## 2018-07-09 MED ORDER — FENTANYL CITRATE (PF) 250 MCG/5ML IJ SOLN
INTRAMUSCULAR | Status: AC
  Filled 2018-07-09: qty 5

## 2018-07-09 MED ORDER — IOPAMIDOL (ISOVUE-370) INJECTION 76%
INTRAVENOUS | Status: AC
  Filled 2018-07-09: qty 125

## 2018-07-09 MED ORDER — PROTAMINE SULFATE 10 MG/ML IV SOLN
INTRAVENOUS | Status: AC
  Filled 2018-07-09: qty 25

## 2018-07-09 MED ORDER — HEPARIN SODIUM (PORCINE) 1000 UNIT/ML IJ SOLN
INTRAMUSCULAR | Status: DC | PRN
  Administered 2018-07-09: 19000 [IU] via INTRAVENOUS

## 2018-07-09 MED ORDER — ACETAMINOPHEN 500 MG PO TABS
1000.0000 mg | ORAL_TABLET | Freq: Once | ORAL | Status: AC
  Administered 2018-07-09: 1000 mg via ORAL

## 2018-07-09 MED ORDER — GABAPENTIN 300 MG PO CAPS
300.0000 mg | ORAL_CAPSULE | Freq: Three times a day (TID) | ORAL | Status: DC
  Administered 2018-07-09 – 2018-07-11 (×6): 300 mg via ORAL
  Filled 2018-07-09 (×6): qty 1

## 2018-07-09 MED ORDER — PHENYLEPHRINE HCL-NACL 20-0.9 MG/250ML-% IV SOLN: 0.0000 ug/min | INTRAVENOUS | Status: DC

## 2018-07-09 MED ORDER — CHLORHEXIDINE GLUCONATE 4 % EX LIQD: 30.0000 mL | CUTANEOUS | Status: DC

## 2018-07-09 MED ORDER — CHLORHEXIDINE GLUCONATE 0.12 % MT SOLN
15.0000 mL | Freq: Once | OROMUCOSAL | Status: AC
  Administered 2018-07-09: 15 mL via OROMUCOSAL

## 2018-07-09 MED ORDER — CHLORHEXIDINE GLUCONATE 4 % EX LIQD
Freq: Once | CUTANEOUS | Status: AC
  Administered 2018-07-09: 23:00:00 via TOPICAL

## 2018-07-09 MED ORDER — TRAMADOL HCL 50 MG PO TABS: 50.0000 mg | ORAL_TABLET | ORAL | Status: DC | PRN

## 2018-07-09 MED ORDER — ACETAMINOPHEN 500 MG PO TABS
ORAL_TABLET | ORAL | Status: AC
  Administered 2018-07-09: 1000 mg via ORAL
  Filled 2018-07-09: qty 2

## 2018-07-09 MED ORDER — ONDANSETRON HCL 4 MG/2ML IJ SOLN
INTRAMUSCULAR | Status: DC | PRN
  Administered 2018-07-09: 4 mg via INTRAVENOUS

## 2018-07-09 MED ORDER — CLOPIDOGREL BISULFATE 75 MG PO TABS
75.0000 mg | ORAL_TABLET | Freq: Every day | ORAL | Status: DC
  Administered 2018-07-10 – 2018-07-11 (×2): 75 mg via ORAL
  Filled 2018-07-09 (×2): qty 1

## 2018-07-09 MED ORDER — ASPIRIN EC 81 MG PO TBEC
81.0000 mg | DELAYED_RELEASE_TABLET | Freq: Every day | ORAL | Status: DC
  Administered 2018-07-10 – 2018-07-11 (×2): 81 mg via ORAL
  Filled 2018-07-09 (×2): qty 1

## 2018-07-09 MED ORDER — OXYCODONE HCL 5 MG PO TABS
5.0000 mg | ORAL_TABLET | ORAL | Status: DC | PRN
  Administered 2018-07-10: 10 mg via ORAL
  Administered 2018-07-10 – 2018-07-11 (×2): 5 mg via ORAL
  Filled 2018-07-09: qty 1
  Filled 2018-07-09: qty 2
  Filled 2018-07-09: qty 1

## 2018-07-09 MED ORDER — SODIUM CHLORIDE 0.9 % IV SOLN
250.0000 mL | INTRAVENOUS | Status: DC | PRN
  Administered 2018-07-10: 250 mL via INTRAVENOUS

## 2018-07-09 MED ORDER — ACETAMINOPHEN 650 MG RE SUPP: 650.0000 mg | Freq: Four times a day (QID) | RECTAL | Status: DC | PRN

## 2018-07-09 MED ORDER — SODIUM CHLORIDE 0.9% FLUSH: 3.0000 mL | INTRAVENOUS | Status: DC | PRN

## 2018-07-09 MED ORDER — SODIUM CHLORIDE 0.9 % IV SOLN
1.5000 g | Freq: Two times a day (BID) | INTRAVENOUS | Status: DC
  Administered 2018-07-09 – 2018-07-10 (×2): 1.5 g via INTRAVENOUS
  Filled 2018-07-09 (×4): qty 1.5

## 2018-07-09 MED ORDER — SODIUM CHLORIDE 0.9 % IV SOLN: INTRAVENOUS | Status: DC

## 2018-07-09 SURGICAL SUPPLY — 34 items
CABLE ADAPT PACING TEMP 12FT (ADAPTER) ×3 IMPLANT
CATH 26 EDWARDS DELIVERY SYS (CATHETERS) ×3 IMPLANT
CATH DIAG 6FR PIGTAIL ANGLED (CATHETERS) ×9 IMPLANT
CATH INFINITI 6F AL1 (CATHETERS) ×3 IMPLANT
CATH S G BIP PACING (CATHETERS) ×3 IMPLANT
CRIMPER (MISCELLANEOUS) ×3 IMPLANT
DEVICE CLOSURE PERCLS PRGLD 6F (VASCULAR PRODUCTS) ×2 IMPLANT
DEVICE INFLATION ATRION QL2530 (MISCELLANEOUS) ×3 IMPLANT
GUIDEWIRE SAFE TJ AMPLATZ EXST (WIRE) ×3 IMPLANT
KIT HEART LEFT (KITS) ×3 IMPLANT
KIT MICROPUNCTURE NIT STIFF (SHEATH) ×3 IMPLANT
PACK CARDIAC CATHETERIZATION (CUSTOM PROCEDURE TRAY) ×3 IMPLANT
PERCLOSE PROGLIDE 6F (VASCULAR PRODUCTS) ×6
PINNACLE LONG 6F 25CM (SHEATH) ×3
SHEATH 14X36 EDWARDS (SHEATH) ×3 IMPLANT
SHEATH BRITE TIP 6FR 35CM (SHEATH) ×3 IMPLANT
SHEATH INTRO PINNACLE 6F 25CM (SHEATH) ×1 IMPLANT
SHEATH PINNACLE 4F 10CM (SHEATH) ×3 IMPLANT
SHEATH PINNACLE 6F 10CM (SHEATH) ×3 IMPLANT
SHEATH PINNACLE 8F 10CM (SHEATH) ×3 IMPLANT
SHEATH PROBE COVER 6X72 (BAG) ×3 IMPLANT
SLEEVE REPOSITIONING LENGTH 30 (MISCELLANEOUS) ×3 IMPLANT
STOPCOCK MORSE 400PSI 3WAY (MISCELLANEOUS) ×6 IMPLANT
SYR MEDRAD MARK V 150ML (SYRINGE) ×3 IMPLANT
TUBE CONN 8.8X1320 FR HP M-F (CONNECTOR) ×3 IMPLANT
TUBING ART PRESS 72  MALE/FEM (TUBING) ×2
TUBING ART PRESS 72 MALE/FEM (TUBING) ×1 IMPLANT
TUBING CIL FLEX 10 FLL-RA (TUBING) ×3 IMPLANT
VALVE HEART TRANSCATH SZ3 26MM (Valve) ×3 IMPLANT
WIRE AMPLATZ SS-J .035X180CM (WIRE) ×3 IMPLANT
WIRE EMERALD 3MM-J .035X150CM (WIRE) ×6 IMPLANT
WIRE EMERALD 3MM-J .035X260CM (WIRE) ×3 IMPLANT
WIRE EMERALD ST .035X260CM (WIRE) ×3 IMPLANT
WIRE HI TORQ VERSACORE-J 145CM (WIRE) ×3 IMPLANT

## 2018-07-09 NOTE — Anesthesia Procedure Notes (Signed)
Procedure Name: MAC Date/Time: 07/09/2018 8:30 AM Performed by: Murvin Natal, MD Pre-anesthesia Checklist: Patient identified, Emergency Drugs available, Suction available and Patient being monitored Oxygen Delivery Method: Simple face mask Comments: PT on simple mask and 8L of O2.  Pt is edentulous.

## 2018-07-09 NOTE — Transfer of Care (Signed)
Immediate Anesthesia Transfer of Care Note  Patient: Dawn Gonzales  Procedure(s) Performed: TRANSCATHETER AORTIC VALVE REPLACEMENT, TRANSFEMORAL (N/A ) TRANSCATHETER AORTIC VALVE REPLACEMENT, TRANSFEMORAL (N/A ) TRANSESOPHAGEAL ECHOCARDIOGRAM (TEE) (N/A ) Cancelled Procedure [place] not functioning correctly  Patient Location: SICU  Anesthesia Type:MAC  Level of Consciousness: sedated  Airway & Oxygen Therapy: Patient Spontanous Breathing and Patient connected to face mask oxygen  Post-op Assessment: Report given to RN and Post -op Vital signs reviewed and stable  Post vital signs: Reviewed and stable  Last Vitals:  Vitals Value Taken Time  BP    Temp    Pulse 68 07/09/2018 11:19 AM  Resp 24 07/09/2018 11:19 AM  SpO2 98 % 07/09/2018 11:19 AM  Vitals shown include unvalidated device data.  Last Pain:  Vitals:   07/09/18 0612  TempSrc:   PainSc: 0-No pain      Patients Stated Pain Goal: 3 (88/32/54 [id number])  Complications: No apparent anesthesia complications

## 2018-07-09 NOTE — Op Note (Signed)
[facility]   MULTIDISCIPLINARY HEART VALVE TEAM   TAVR OPERATIVE NOTE   Date of Procedure:  07/09/2018  Preoperative Diagnosis: Severe Aortic Stenosis   Postoperative Diagnosis: Same   Procedure:    Transcatheter Aortic Valve Replacement - Percutaneous Transfemoral Approach  Edwards Sapien 3 THV (size 26 mm, model # 9600TFX, serial #[id number])   Co-Surgeons:  Salvatore Decent. Cornelius Moras, MD and Tonny Bollman, MD  Anesthesiologist:  Karna Christmas, MD  Echocardiographer:  Tobias Alexander, MD  Pre-operative Echo Findings:  Severe aortic stenosis  Normal left ventricular systolic function  Post-operative Echo Findings:  Mild paravalvular leak  Normal/unchanged left ventricular systolic function  BRIEF CLINICAL NOTE AND INDICATIONS FOR SURGERY  78 year old woman with morbid obesity, history of mitral valve endocarditis, chronic diastolic heart failure, and now with progressive and severe symptomatic aortic stenosis, presenting for elective TAVR.  During the course of the patient's preoperative work up they have been evaluated comprehensively by a multidisciplinary team of specialists coordinated through the Multidisciplinary Heart Valve Clinic in the [facility].  They have been demonstrated to suffer from symptomatic severe aortic stenosis as noted above. The patient has been counseled extensively as to the relative risks and benefits of all options for the treatment of severe aortic stenosis including long term medical therapy, conventional surgery for aortic valve replacement, and transcatheter aortic valve replacement.  The patient has been independently evaluated by Dr. Cornelius Moras and formal cardiac surgical consultation, and is felt to be high risk for conventional surgical aortic valve replacement based upon multiple severe comorbid medical conditions.  Based upon review of all of the patient's preoperative diagnostic tests they are felt to be  candidate for transcatheter aortic valve replacement using the transfemoral approach as an alternative to high risk conventional surgery.    Following the decision to proceed with transcatheter aortic valve replacement, a discussion has been held regarding what types of management strategies would be attempted intraoperatively in the event of life-threatening complications, including whether or not the patient would be considered a candidate for the use of cardiopulmonary bypass and/or conversion to open sternotomy for attempted surgical intervention.  The patient has been advised of a variety of complications that might develop peculiar to this approach including but not limited to risks of death, stroke, paravalvular leak, aortic dissection or other major vascular complications, aortic annulus rupture, device embolization, cardiac rupture or perforation, acute myocardial infarction, arrhythmia, heart block or bradycardia requiring permanent pacemaker placement, congestive heart failure, respiratory failure, renal failure, pneumonia, infection, other late complications related to structural valve deterioration or migration, or other complications that might ultimately cause a temporary or permanent loss of functional independence or other long term morbidity.  The patient provides full informed consent for the procedure as described and all questions were answered preoperatively.  DETAILS OF THE OPERATIVE PROCEDURE  PREPARATION:   The patient is brought to the operating room on the above mentioned date and central monitoring was established by the anesthesia team including placement of a central venous catheter and radial arterial line. The patient is placed in the supine position on the operating table.  Intravenous antibiotics are administered. The patient is monitored closely throughout the procedure under conscious sedation.  Baseline transthoracic echocardiogram is performed. The patient's chest,  abdomen, both groins, and both lower extremities are prepared and draped in a sterile manner. A time out procedure is performed.   PERIPHERAL ACCESS:   Using ultrasound guidance, femoral arterial access is obtained with  placement of 6 Fr sheath on the left side.  Arterial access is difficult even under ultrasound guidance.  The artery was cannulated a second time and a 4 French sheath was placed.  A pigtail diagnostic catheter was passed through the femoral arterial sheath under fluoroscopic guidance into the aortic root.  A temporary transvenous pacemaker catheter was passed through the femoral venous sheath on the right side under fluoroscopic guidance into the right ventricle.  Ultrasound guidance was used for this access site as well.  The pacemaker was tested to ensure stable lead placement and pacemaker capture. Aortic root angiography was performed in order to determine the optimal angiographic angle for valve deployment.  TRANSFEMORAL ACCESS:  A micropuncture technique is used to access the right femoral artery under fluoroscopic and ultrasound guidance.  2 Perclose devices are deployed at 10' and 2' positions to 'PreClose' the femoral artery. An 8 French sheath is placed and then an Amplatz Superstiff wire is advanced through the sheath. This is changed out for a 14 French transfemoral E-Sheath after progressively dilating over the Superstiff wire.  An AL-1 catheter was used to direct a straight-tip exchange length wire across the native aortic valve into the left ventricle. This was exchanged out for a pigtail catheter and position was confirmed in the LV apex. Simultaneous LV and Ao pressures were recorded.  The pigtail catheter was exchanged for an Amplatz Extra-stiff wire in the LV apex.  Echocardiography was utilized to confirm appropriate wire position and no sign of entanglement in the mitral subvalvular apparatus.  BALLOON AORTIC VALVULOPLASTY:  Not performed  TRANSCATHETER HEART VALVE  DEPLOYMENT:  An Edwards Sapien 3 transcatheter heart valve (size 26 mm, model #9600TFX, serial #[id number]) was prepared and crimped per manufacturer's guidelines, and the proper orientation of the valve is confirmed on the [employer] delivery system. The valve was advanced through the introducer sheath using normal technique until in an appropriate position in the abdominal aorta beyond the sheath tip. The balloon was then retracted and using the fine-tuning wheel was centered on the valve. The valve was then advanced across the aortic arch using appropriate flexion of the catheter. The valve was carefully positioned across the aortic valve annulus. The Commander catheter was retracted using normal technique. Once final position of the valve has been confirmed by angiographic assessment, the valve is deployed while temporarily holding ventilation and during rapid ventricular pacing to maintain systolic blood pressure < 50 mmHg and pulse pressure < 10 mmHg. The balloon inflation is held for >3 seconds after reaching full deployment volume. Once the balloon has fully deflated the balloon is retracted into the ascending aorta and valve function is assessed using echocardiography. There is felt to be mild paravalvular leak and no central aortic insufficiency.  The patient's hemodynamic recovery following valve deployment is good.  The deployment balloon and guidewire are both removed. Echo demostrated acceptable post-procedural gradients, stable mitral valve function, and mild aortic insufficiency.    PROCEDURE COMPLETION:  The sheath was removed and femoral artery closure is performed using the 2 previously deployed Perclose devices.  Protamine is administered once femoral arterial repair was complete. The site is clear with no evidence of bleeding or hematoma after the sutures are tightened. The temporary pacemaker, pigtail catheters and femoral sheaths were removed with manual pressure used for hemostasis.     The patient tolerated the procedure well and is transported to the surgical intensive care in stable condition. There were no immediate intraoperative complications. All sponge instrument  and needle counts are verified correct at completion of the operation.   The patient received a total of 40 mL of intravenous contrast during the procedure.   Tonny Bollman, MD 07/09/2018 10:33 AM

## 2018-07-09 NOTE — Anesthesia Procedure Notes (Addendum)
Arterial Line Insertion Start/End3/01/2019 7:15 AM, 07/09/2018 7:21 AM Performed by: Leonides Grills, MD, anesthesiologist  Patient location: Pre-op. Preanesthetic checklist: patient identified, IV checked, risks and benefits discussed, surgical consent, pre-op evaluation and timeout performed Lidocaine 1% used for infiltration and patient sedated Right, radial was placed Catheter size: 20 G Hand hygiene performed , maximum sterile barriers used  and Seldinger technique used Allen's test indicative of satisfactory collateral circulation Attempts: 1 Procedure performed using ultrasound guided technique. Ultrasound Notes:anatomy identified, needle tip was noted to be adjacent to the nerve/plexus identified and no ultrasound evidence of intravascular and/or intraneural injection Following insertion, dressing applied and Biopatch. Post procedure assessment: normal  Patient tolerated the procedure well with no immediate complications.

## 2018-07-09 NOTE — Progress Notes (Signed)
Patient ID: Dawn Gonzales, female   DOB: 12-02-1940, 78 y.o.   MRN: [id number] [facility] Evening Rounds:  Hemodynamically stable. Temp pacer on VVI 45. Rhythm is sinus 60's but has some episodes of pacing.  Groin sites ok She is alert and comfortable.

## 2018-07-09 NOTE — Anesthesia Postprocedure Evaluation (Signed)
Anesthesia Post Note  Patient: Dawn Gonzales  Procedure(s) Performed: TRANSCATHETER AORTIC VALVE REPLACEMENT, TRANSFEMORAL (N/A )     Patient location during evaluation: ICU Anesthesia Type: MAC Level of consciousness: awake and alert Pain management: pain level controlled Vital Signs Assessment: post-procedure vital signs reviewed and stable Respiratory status: spontaneous breathing, nonlabored ventilation, respiratory function stable and patient connected to nasal cannula oxygen Cardiovascular status: stable and blood pressure returned to baseline Postop Assessment: no apparent nausea or vomiting Anesthetic complications: no    Last Vitals:  Vitals:   07/09/18 1047 07/09/18 1200  BP:  (!) 113/96  Pulse: (!) 0 69  Resp:  16  Temp:    SpO2:  97%    Last Pain:  Vitals:   07/09/18 1200  TempSrc:   PainSc: 1                  Ryan P Ellender

## 2018-07-09 NOTE — Anesthesia Procedure Notes (Signed)
Central Venous Catheter Insertion Performed by: Leonides Grills, MD, anesthesiologist Start/End3/01/2019 7:05 AM, 07/09/2018 7:15 AM Patient location: Pre-op. Preanesthetic checklist: patient identified, IV checked, site marked, risks and benefits discussed, surgical consent, monitors and equipment checked, pre-op evaluation, timeout performed and anesthesia consent Position: Trendelenburg Lidocaine 1% used for infiltration and patient sedated Hand hygiene performed , maximum sterile barriers used  and Seldinger technique used Catheter size: 8 Fr Total catheter length 16. Central line was placed.Double lumen Procedure performed using ultrasound guided technique. Ultrasound Notes:anatomy identified, needle tip was noted to be adjacent to the nerve/plexus identified, no ultrasound evidence of intravascular and/or intraneural injection and image(s) printed for medical record Attempts: 1 Following insertion, dressing applied, line sutured and Biopatch. Post procedure assessment: blood return through all ports  Patient tolerated the procedure well with no immediate complications.

## 2018-07-09 NOTE — Progress Notes (Signed)
  Echocardiogram 2D Echocardiogram has been performed.  Dawn Gonzales 07/09/2018, 10:16 AM

## 2018-07-09 NOTE — Progress Notes (Signed)
  [facility]   MULTIDISCIPLINARY HEART VALVE TEAM  Patient doing well s/p TAVR. She is hemodynamically stable off pressors. Groin sites stable; temp wire in place in right groin. ECG shows paced rhythm. Telemetry also showed paced rhythm. I turned down pacer to 45 bpm and she has underlying sinus brady with HRs in 50s. Will have EP come assess patient.    Cline Crock PA-C  [facility]  Pager [phone]

## 2018-07-09 NOTE — Consult Note (Addendum)
Cardiology Consultation:   Patient ID: Mckenzie Bove MRN: [id number]; DOB: Nov 21, 1940  Admit date: 07/09/2018 Date of Consult: 07/10/2018  Primary Care Provider: [name], MD Primary Cardiologist: Lauree Chandler, MD  Primary Electrophysiologist:  None    Patient Profile:   Avian Greenawalt is a 78 y.o. female with a hx of prior mitral valve endocarditis (in the setting of a cellulitis in 2019, though no valve surgery), chronic diastolic CHF, TIA 9678, cephalic vein thrombosis 9381, morbid obesity, HLD, and severe AS who is being seen today for the evaluation of post TAVR CHB/systole at the request of Dr. Burt Knack.  History of Present Illness:   Ms. Moncrief moved to [place] fairly recently to be near family.  It appears her VHD w/u started in [place] at [facility] last year.  In review of her record, she was treated choledocholithiasis requiring drain placement that was eventually removed.  She also apparently Feb 2019 treated for MV endocarditis associated with cellulitis.  Here she was re-hospitalized at [facility] for recurrent acute cholecystitis complicated by choledocholithiasis and sepsis, treated with broad-spectrum antibiotics, She was seen for surgical consultation however ultimately underwent endoscopic ultrasound and ERCP with successful stone extraction and biliary sphincterotomy.  Her drain subsequently removed, with mention of need for eventual cholecystectomy.  Dr. Angelena Form noted "When I met her in November 2019 the drain was still in place. Her drain has been removed. She has been seen by Dr. Donne Hazel with [facility] and he feels that her cholecystectomy can wait until after her TAVR is completed. The risk of recurrent cholecystitis is high but the ERCP and sphincterotomy will lower her risk of common bile duct stones per Dr. Donne Hazel" She has also subsequently had dental extractions taken care of.    Today she came for TAVR  which was done.  She had RBBB at baseline and intra-procedure the patient developed complete heart block following positioning of the extra-stiff wire across the aortic valve.  Her temp pacing wire remains in place, EP team is asked to see for PPM implant.  She is afebrile LABS  (07/05/2018) BUN/Creat 28/0.74 WBC 6.8 plts 219  (07/09/2018) K+ 4.3 H/H 11/34  This AM labs are pending  Past Medical History:  Diagnosis Date  . Anemia   . Anxiety   . Arthritis   . Cholecystitis   . Chronic diastolic CHF (congestive heart failure), NYHA class 2 ([place])   . Complication of anesthesia    very slow to wake up  . Depression   . History of cellulitis   . History of CVA (cerebrovascular accident)   . History of endocarditis   . Hyperlipemia   . Hypertension   . Morbid obesity ([place])   . Neuropathy   . Physical deconditioning   . Right bundle branch block (RBBB)   . 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 Severe aortic stenosis   . Sleep apnea   . Tobacco abuse     Past Surgical History:  Procedure Laterality Date  . Abdominal gangrene    . ADENOIDECTOMY    . Cataract surgery    . COLONOSCOPY    . ENDOSCOPIC RETROGRADE CHOLANGIOPANCREATOGRAPHY (ERCP) WITH PROPOFOL N/A 02/08/2018   Procedure: ENDOSCOPIC RETROGRADE CHOLANGIOPANCREATOGRAPHY (ERCP) WITH PROPOFOL;  Surgeon: Rush Landmark Telford Nab., MD;  Location: [place];  Service: Gastroenterology;  Laterality: N/A;  . EUS  02/08/2018  Procedure: UPPER ENDOSCOPIC ULTRASOUND (EUS) LINEAR;  Surgeon: Irving Copas., MD;  Location: [place];  Service: Gastroenterology;;  . EYE SURGERY    . IR FLUORO RM 30-60 MIN  03/27/2018  . IR PERC CHOLECYSTOSTOMY  02/09/2018  . IR RADIOLOGIST EVAL & MGMT  03/26/2018  . IR RADIOLOGY PERIPHERAL GUIDED IV START  05/22/2018  . IR RADIOLOGY PERIPHERAL GUIDED IV  START  05/30/2018  . IR RADIOLOGY PERIPHERAL GUIDED IV START  06/05/2018  . IR US GUIDE VASC ACCESS RIGHT  05/22/2018  . IR US GUIDE VASC ACCESS RIGHT  05/30/2018  . IR US GUIDE VASC ACCESS RIGHT  06/05/2018  . REMOVAL OF STONES  02/08/2018   Procedure: REMOVAL OF STONES;  Surgeon: Rush Landmark Telford Nab., MD;  Location: [place];  Service: Gastroenterology;;  . Joan Mayans  02/08/2018   Procedure: Joan Mayans;  Surgeon: Mansouraty, Telford Nab., MD;  Location: [place];  Service: Gastroenterology;;  . TONSILLECTOMY    . TOOTH EXTRACTION N/A 06/18/2018   Procedure: DENTAL RESTORATION/EXTRACTIONS;  Surgeon: Diona Browner, DDS;  Location: [place];  Service: Oral Surgery;  Laterality: N/A;  . TRANSCATHETER AORTIC VALVE REPLACEMENT, TRANSFEMORAL N/A 07/09/2018   Procedure: TRANSCATHETER AORTIC VALVE REPLACEMENT, TRANSFEMORAL;  Surgeon: Sherren Mocha, MD;  Location: [place] [facility];  Service: Cardiovascular;  Laterality: N/A;  . Tummy tuck       Home Medications:  Prior to Admission medications   Medication Sig Start Date End Date Taking? Authorizing Provider  acetaminophen (TYLENOL) 650 MG CR tablet Take 1,300 mg by mouth every 8 (eight) hours as needed for pain.   Yes [provider]  aspirin EC 81 MG tablet Take 81 mg by mouth daily.   Yes [provider]  Calcium Carbonate (CALCIUM-CARB 600 PO) Take 600 mg by mouth daily.   Yes [provider]  cholecalciferol (VITAMIN D) 1000 units tablet Take 1,000 Units by mouth daily.   Yes [provider]  ferrous sulfate 325 (65 FE) MG tablet Take 325 mg by mouth daily with breakfast.   Yes [provider]  gabapentin (NEURONTIN) 300 MG capsule Take 300 mg by mouth 3 (three) times daily.   Yes [provider]  Multiple Vitamin (MULTIVITAMIN WITH MINERALS) TABS tablet Take 1 tablet by mouth daily.   Yes [provider]  niacin 500 MG tablet Take 500 mg by mouth daily.   Yes [provider]  nystatin ointment (MYCOSTATIN) Apply 1 application topically 2 (two) times daily as needed (rash).  01/26/18  Yes [provider]  pravastatin (PRAVACHOL) 20 MG tablet Take 20 mg by mouth at bedtime.    Yes [provider]  traMADol (ULTRAM) 50 MG tablet Take 50 mg by mouth every 6 (six) hours as needed for moderate pain.   Yes [provider]  venlafaxine (EFFEXOR) 37.5 MG tablet Take 37.5 mg by mouth daily.   Yes [provider]  vitamin C (ASCORBIC ACID) 500 MG tablet Take 500 mg by mouth daily.   Yes [provider]  ZINC OXIDE EX Apply 1 application topically daily as needed (wound care).   Yes [provider]  oxyCODONE (OXY IR/ROXICODONE) 5 MG immediate release tablet Take 1 tablet (5 mg total) by mouth every 4 (four) hours as needed. Patient not taking: Reported on 07/02/2018 06/18/18   Diona Browner, DDS    Inpatient Medications: Scheduled Meds: . [START ON 07/10/2018] aspirin EC  81 mg Oral Daily  . [START ON 07/10/2018] clopidogrel  75 mg Oral Q breakfast  . [START ON 07/10/2018] ferrous sulfate  325 mg Oral Q breakfast  . gabapentin  300 mg Oral TID  . pravastatin  20 mg Oral QHS  . sodium chloride flush  3 mL Intravenous Q12H  . [START ON 07/10/2018] venlafaxine  37.5 mg Oral Daily   Continuous Infusions: . sodium chloride    . sodium chloride    . cefUROXime (ZINACEF)  IV    . nitroGLYCERIN    . phenylephrine (NEO-SYNEPHRINE) Adult infusion    . vancomycin     PRN Meds: sodium chloride, acetaminophen **OR** acetaminophen, metoprolol tartrate, morphine injection, ondansetron (ZOFRAN) IV, oxyCODONE, sodium chloride flush, traMADol  Allergies:   No Known Allergies  Social History:   Social History   Socioeconomic History  . Marital status: Married    Spouse name: Not on file  . Number of children: 3  . Years of education: Not on file  . Highest education level: Not on file  Occupational History  .  Occupation: Retired Cytogeneticist  . Financial resource strain: Not on file  . Food insecurity:    Worry: Not on file    Inability: Not on file  . Transportation needs:    Medical: Not on file    Non-medical: Not on file  Tobacco Use  . Smoking status: Former Smoker    Types: Cigarettes    Last attempt to quit: 05/23/1978    Years since quitting: 40.1  . Smokeless tobacco: Never Used  . Tobacco comment: only 1 pack pewr week   Substance and Sexual Activity  . Alcohol use: Yes    Comment: Occasional  . Drug use: Never  . Sexual activity: Not on file  Lifestyle  . Physical activity:    Days per week: Not on file    Minutes per session: Not on file  . Stress: Not on file  Relationships  . Social connections:    Talks on phone: Not on file    Gets together: Not on file    Attends religious service: Not on file    Active member of club or organization: Not on file    Attends meetings of clubs or organizations: Not on file    Relationship status: Not on file  . Intimate partner violence:    Fear of current or ex partner: Not on file    Emotionally abused: Not on file    Physically abused: Not on file    Forced sexual activity: Not on file  Other Topics Concern  . Not on file  Social History Narrative  . Not on file    Family History:   Family History  Problem Relation Age of Onset  . Cancer Mother   . Hypertension Father   . Arthritis Father   . Heart attack Father      ROS:  Please see the history of present illness.  All other ROS reviewed and negative.     Physical Exam/Data:   Vitals:   07/09/18 1037 07/09/18 1042 07/09/18 1047 07/09/18 1200  BP:    (!) 113/96  Pulse: (!) 143 89 (!) 0 69  Resp:    16  Temp:      TempSrc:      SpO2:    97%    Intake/Output Summary (Last 24 hours) at 07/09/2018 1418 Last data filed at 07/09/2018 1026 Gross per 24 hour  Intake 1700 ml  Output 75 ml  Net 1625 ml  Last 3 Weights 07/05/2018 07/01/2018 06/18/2018    Weight (lbs) 269 lb 6.4 oz 269 lb 265 lb  Weight (kg) 122.199 kg 122.018 kg 120.203 kg     There is no height or weight on file to calculate BMI.  General:  Well nourished, well developed, in no acute distress HEENT: normal Lymph: no adenopathy Neck: difficult to assess given body habitus Endocrine:  No thryomegaly Vascular: No carotid bruits  Cardiac:  RRR; no murmurs, gallops or rubs Lungs:  CTA b/l, no wheezing, rhonchi or rales  Abd: soft, nontender, obese  Ext: no pitting edema, she has chronic erythematous skin changes Musculoskeletal:  No deformities Skin: warm and dry  Neuro:   no focal abnormalities noted Psych:  Normal affect   EKG:  The EKG was personally reviewed and demonstrates:   V paced, some intrinsic beats noted  07/05/2018: SR, RBBB  Telemetry:  Telemetry was personally reviewed and demonstrates:   R/SB, intermittent V pacing overninght  Relevant CV Studies:  02/01/18: TTE Study Conclusions - Left ventricle: The cavity size was normal. Wall thickness was   increased in a pattern of severe LVH. Systolic function was   normal. The estimated ejection fraction was in the range of 55%   to 60%. Wall motion was normal; there were no regional wall   motion abnormalities. Doppler parameters are consistent with   abnormal left ventricular relaxation (grade 1 diastolic   dysfunction). Doppler parameters are consistent with high   ventricular filling pressure. - Aortic valve: Severely calcified annulus. Trileaflet; severely   thickened leaflets. There was severe stenosis. Mean gradient (S):   51 mm Hg. VTI ratio of LVOT to aortic valve: 0.18. Valve area   (VTI): 0.67 cm^2. Valve area (Vmax): 0.76 cm^2. Valve area   (Vmean): 0.67 cm^2. - Mitral valve: Severely calcified annulus. The findings are   consistent with mild stenosis. Mean gradient (D): 3 mm Hg. Valve   area by pressure half-time: 2.18 cm^2. Valve area by continuity   equation (using LVOT flow): 1.8  cm^2. - Left atrium: The atrium was moderately dilated. - Atrial septum: There was increased thickness of the septum,   consistent with lipomatous hypertrophy. No defect or patent   foramen ovale was identified.  By notes: She had a cardiac catheterization in [facility] [place] on 06/20/17. I personally reviewed those cath films and she has minimal CAD. Right heart pressures (RA 10, RV 45/9, PA: 53/13 PW: 16 AO: 108/67, LV 173/15/25. CO 7.24 L/min, CI: 4.4 L/min/m2, AVA 1.08 cm2, AV gadient 45 mmHg).   Laboratory Data:  Chemistry Recent Labs  Lab 07/05/18 1019 07/09/18 1145  NA 138 141  K 4.0 4.3  CL 105  --   CO2 24  --   GLUCOSE 99 118*  BUN 28*  --   CREATININE 0.74  --   CALCIUM 9.4  --   GFRNONAA >60  --   GFRAA >60  --   ANIONGAP 9  --     Recent Labs  Lab 07/05/18 1019  PROT 6.9  ALBUMIN 3.6  AST 18  ALT 13  ALKPHOS 75  BILITOT 1.1   Hematology Recent Labs  Lab 07/05/18 1019 07/09/18 1145  WBC 6.8  --   RBC 4.49  --   HGB 12.9 11.6*  HCT 42.5 34.0*  MCV 94.7  --   MCH 28.7  --   MCHC 30.4  --   RDW 14.8  --   PLT 219  --  Cardiac EnzymesNo results for input(s): TROPONINI in the last 168 hours. No results for input(s): TROPIPOC in the last 168 hours.  BNP Recent Labs  Lab 07/05/18 1020  BNP 174.4*    DDimer No results for input(s): DDIMER in the last 168 hours.  Radiology/Studies:   Dg Chest Port 1 View Result Date: 07/09/2018 CLINICAL DATA:  Status post transcatheter aortic valve replacement EXAM: PORTABLE CHEST 1 VIEW COMPARISON:  July 05, 2018 FINDINGS: A left central line has been placed in the interval and appears to terminate in the central left brachiocephalic vein. Stable cardiomegaly. The patient is status post TAVR. A pacer lead projects over the right ventricle. The lungs are clear. The retrocardiac region is not well assessed. IMPRESSION: 1. The patient is status post a TAVR procedure. 2. A single pacemaker lead is identified. 3. A left  central line terminates in the central left brachiocephalic vein. No pneumothorax. 4. No other abnormalities. Electronically Signed   By: Dorise Bullion III M.D   On: 07/09/2018 11:54    Assessment and Plan:   1. VHD w/severe AS     S/p TAVR 2. Intra-procedural CHB     Baseline RBBB     Temp pacing wire in place (45bpm)     She has had intermittent pacing overnight, in d/w Dr. Minna Merritts. Burt Knack yesterday concerns about conduction system given RBBB/CHB with placement of wire yesterday alone       Dr. Lovena Le has seen and examined the patient.  Discussed indication/rational for PPM recommendation, the procedure, risks and benefits.  Specifically her increased infection risk.  She is agreeable to proceed  I have discussed with staff, pt need peripheral IV access, removal of art line and IJ line prior to PPM       For questions or updates, please contact [place] Please consult [email or web address] for contact info under     Signed, Baldwin [place], PA-C  07/09/2018 2:18 PM  EP Attending  Patient seen and examined. Agree with the findings as noted above. The patient has baseline conduction system disease and underwent TAVR for severe AS and developed CHB. She actually had CHB with insertion of the wire across the valve. The patient received a temp PM and has continued to have mostly NSR with some intermittent pacing. While there is a possibility that her conduction will recover, she remains at risk for developing CHB. Her vascular access is poor. Before we place a PPM, she will need to have a peripheral IV in either arm. Her size makes percutaneous access to the SCV much more difficult and I would like to be able to perform a venogram. I have discussed the indications/risks/benefits/goals/expectations of PPM insertion and she wishes to proceed.  Mikle Bosworth.D.

## 2018-07-09 NOTE — Interval H&P Note (Signed)
History and Physical Interval Note:  07/09/2018 6:25 AM  Dawn Gonzales  has presented today for surgery, with the diagnosis of Severe Aortic Stenosis.  The various methods of treatment have been discussed with the patient and family. After consideration of risks, benefits and other options for treatment, the patient has consented to  Procedure(s): TRANSCATHETER AORTIC VALVE REPLACEMENT, TRANSFEMORAL (N/A) TRANSESOPHAGEAL ECHOCARDIOGRAM (TEE) (N/A) as a surgical intervention.  The patient's history has been reviewed, patient examined, no change in status, stable for surgery.  I have reviewed the patient's chart and labs.  Questions were answered to the patient's satisfaction.     Purcell Nails

## 2018-07-09 NOTE — Op Note (Signed)
HEART AND VASCULAR CENTER   MULTIDISCIPLINARY HEART VALVE TEAM   TAVR OPERATIVE NOTE   Date of Procedure:  07/09/2018  Preoperative Diagnosis: Severe Aortic Stenosis   Postoperative Diagnosis: Same   Procedure:    Transcatheter Aortic Valve Replacement - Percutaneous Right Transfemoral Approach  Edwards Sapien 3 THV (size 26 mm, model # 9600TFX, serial # [id number])   Co-Surgeons:  Salvatore Decent. 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  Patient states that she has been told that she had a heart murmur for many years.  Approximately 1 year ago she was hospitalized for mitral valve endocarditis that occurred in the setting of cellulitis.  She was diagnosed with severe symptomatic aortic stenosis at that time.  She was evaluated in [place] where she underwent diagnostic cardiac catheterization that revealed minimal nonobstructive coronary artery disease, mildly elevated right heart pressures, severe aortic stenosis.  She was noted to have poor dentition and dental extraction was recommended.  At that time she still had a biliary drain in  place having prior to that presented with acute cholecystitis complicated by choledocho cholelithiasis.  Several months later she moved to [place] where she now lives in [place] close to her daughter.  She was admitted to any [facility] October 2019 with recurrent acute cholecystitis complicated by choledocholithiasis and sepsis.  Transthoracic echocardiogram performed at that time revealed severe aortic stenosis with normal left ventricular systolic function.  She was referred to general surgery but ultimately underwent ERCP with successful stone extraction and biliary sphincterotomy.  Since then she has been seen in follow-up by Dr. Lessie Dings from [facility] to consider elective cholecystectomy.  She was felt to be a poor candidate and very high risk because of presence of severe aortic stenosis.  She was subsequently referred to the multidisciplinary heart valve clinic and has been evaluated previously by Dr. Clifton James.  Dental consultation was requested and she recently underwent dental extraction.  She has been evaluated for sleep apnea.  She recently underwent CT angiography and has now been referred for cardiac surgical consultation.  She recently underwent dental extraction and has recovered uneventfully.  The patient has been seen in consultation and counseled at length regarding the indications, risks and potential benefits of surgery.  All questions have been answered, and the patient provides full informed consent for the operation as described.  During the course of the patient's preoperative work up they have been evaluated comprehensively by a multidisciplinary team of specialists coordinated through the Multidisciplinary Heart Valve Clinic in the [facility].  They have been demonstrated to suffer from symptomatic severe aortic stenosis as noted above. The patient has been counseled extensively as to the relative risks and benefits of all  options for the treatment of severe aortic stenosis including  long term medical therapy, conventional surgery for aortic valve replacement, and transcatheter aortic valve replacement.  All questions have been answered, and the patient provides full informed consent for the operation as described.   DETAILS OF THE OPERATIVE PROCEDURE  PREPARATION:    The patient is brought to the operating room on the above mentioned date and central monitoring was established by the anesthesia team including placement of a central venous line and radial arterial line. The patient is placed in the supine position on the operating table.  Intravenous antibiotics are administered. The patient is monitored closely throughout the procedure under conscious sedation.  Baseline transthoracic echocardiogram was performed. The patient's chest, abdomen, both groins, and both lower extremities are prepared and draped in a sterile manner. A time out procedure is performed.   PERIPHERAL ACCESS:    Using the modified Seldinger technique, femoral arterial access was obtained with placement of 6 Fr sheaths on the left side.  A second 4 French arterial sheath was also placed on the left side.  A 6 French venous sheath is placed on the right side.  A pigtail diagnostic catheter was passed through the left arterial sheath under fluoroscopic guidance into the aortic root.  A temporary transvenous pacemaker catheter was passed through the right femoral venous sheath under fluoroscopic guidance into the right ventricle.  The pacemaker was tested to ensure stable lead placement and pacemaker capture. Aortic root angiography was performed in order to determine the optimal angiographic angle for valve deployment.   TRANSFEMORAL ACCESS:   Percutaneous transfemoral access and sheath placement was performed by Dr. Lonzo Candy using ultrasound guidance.  The right common femoral artery was cannulated using a micropuncture needle and appropriate  location was verified using hand injection angiogram.  A pair of Abbott Perclose percutaneous closure devices were placed and a 6 French sheath replaced into the femoral artery.  The patient was heparinized systemically and ACT verified > 250 seconds.    A 14 Fr transfemoral E-sheath was introduced into the right common femoral artery after progressively dilating over an Amplatz superstiff wire. An AL-1 catheter was used to direct a straight-tip exchange length wire across the native aortic valve into the left ventricle. This was exchanged out for a pigtail catheter and position was confirmed in the LV apex. Simultaneous LV and Ao pressures were recorded.  The pigtail catheter was exchanged for an Amplatz Extra-stiff wire in the LV apex.  Of note, the patient developed complete heart block following positioning of the extra-stiff wire across the aortic valve.     TRANSCATHETER HEART VALVE DEPLOYMENT:   An Edwards Sapien 3 transcatheter heart valve (size 26 mm, model #9600TFX, serial #[id number]) was prepared and crimped per manufacturer's guidelines, and the proper orientation of the valve is confirmed on the [employer] delivery system. The valve was advanced through the introducer sheath using normal technique until in an appropriate position in the abdominal aorta beyond the sheath tip. The balloon was then retracted and using the fine-tuning wheel was centered on the valve. The valve was then advanced across the aortic arch using appropriate flexion of the catheter. The valve was carefully positioned across the aortic valve annulus. The Commander catheter was retracted using normal technique. Once final position of the valve has been confirmed by angiographic assessment, the valve is deployed while temporarily holding ventilation and during rapid ventricular pacing to maintain systolic blood pressure < 50 mmHg and pulse pressure < 10 mmHg. The balloon inflation is held for >3 seconds  after reaching  full deployment volume. Once the balloon has fully deflated the balloon is retracted into the ascending aorta and valve function is assessed using echocardiography. There is felt to be mild paravalvular leak and no central aortic insufficiency.  The patient's hemodynamic recovery following valve deployment is good.  The deployment balloon and guidewire are both removed.    PROCEDURE COMPLETION:   The sheath was removed and femoral artery closure performed by Dr Excell Seltzer.  Protamine was administered once femoral arterial repair was complete. The pigtail catheters and femoral sheaths were removed with manual pressure used for hemostasis.  The temporary pacemaker wire and right femoral venous sheath were left in place and secured to the skin.  The patient tolerated the procedure well and is transported to the surgical intensive care in stable condition. There were no immediate intraoperative complications. All sponge instrument and needle counts are verified correct at completion of the operation.   No blood products were administered during the operation.  The patient received a total of 40 mL of intravenous contrast during the procedure.   Purcell Nails, MD 07/09/2018 10:30 AM

## 2018-07-10 ENCOUNTER — Encounter (HOSPITAL_COMMUNITY)
Admission: RE | Disposition: A | Discharge status: Home / Self Care | Payer: Self-pay | Source: Home / Self Care | Attending: Thoracic Surgery (Cardiothoracic Vascular Surgery)

## 2018-07-10 ENCOUNTER — Inpatient Hospital Stay (HOSPITAL_COMMUNITY): Payer: Medicare PPO

## 2018-07-10 DIAGNOSIS — I442 Atrioventricular block, complete: Secondary | ICD-10-CM

## 2018-07-10 DIAGNOSIS — I350 Nonrheumatic aortic (valve) stenosis: Secondary | ICD-10-CM

## 2018-07-10 DIAGNOSIS — Z952 Presence of prosthetic heart valve: Secondary | ICD-10-CM

## 2018-07-10 DIAGNOSIS — Z954 Presence of other heart-valve replacement: Secondary | ICD-10-CM

## 2018-07-10 HISTORY — PX: PACEMAKER IMPLANT: EP1218

## 2018-07-10 LAB — CBC
HCT: 35.4 % — ABNORMAL LOW (ref 36.0–46.0)
Hemoglobin: 10.5 g/dL — ABNORMAL LOW (ref 12.0–15.0)
MCH: 28.5 pg (ref 26.0–34.0)
MCHC: 29.7 g/dL — ABNORMAL LOW (ref 30.0–36.0)
MCV: 95.9 fL (ref 80.0–100.0)
Platelets: 177 10*3/uL (ref 150–400)
RBC: 3.69 MIL/uL — ABNORMAL LOW (ref 3.87–5.11)
RDW: 14.8 % (ref 11.5–15.5)
WBC: 8.5 10*3/uL (ref 4.0–10.5)
nRBC: 0 % (ref 0.0–0.2)

## 2018-07-10 LAB — ECHOCARDIOGRAM LIMITED: Weight: 4458.58 oz

## 2018-07-10 LAB — BASIC METABOLIC PANEL
Anion gap: 5 (ref 5–15)
BUN: 19 mg/dL (ref 8–23)
CO2: 29 mmol/L (ref 22–32)
Calcium: 8.8 mg/dL — ABNORMAL LOW (ref 8.9–10.3)
Chloride: 106 mmol/L (ref 98–111)
Creatinine, Ser: 0.68 mg/dL (ref 0.44–1.00)
GFR calc Af Amer: >60 mL/min (ref >60)
GFR calc non Af Amer: >60 mL/min (ref >60)
Glucose, Bld: 101 mg/dL — ABNORMAL HIGH (ref 70–99)
Potassium: 4.2 mmol/L (ref 3.5–5.1)
Sodium: 140 mmol/L (ref 135–145)

## 2018-07-10 LAB — MAGNESIUM: Magnesium: 1.8 mg/dL (ref 1.7–2.4)

## 2018-07-10 SURGERY — PACEMAKER IMPLANT

## 2018-07-10 MED ORDER — LIDOCAINE HCL (PF) 1 % IJ SOLN
INTRAMUSCULAR | Status: AC
  Filled 2018-07-10: qty 60

## 2018-07-10 MED ORDER — SODIUM CHLORIDE 0.9 % IV SOLN: 250.0000 mL | INTRAVENOUS | Status: DC

## 2018-07-10 MED ORDER — SODIUM CHLORIDE 0.9 % IV SOLN
1.5000 g | Freq: Two times a day (BID) | INTRAVENOUS | Status: AC
  Administered 2018-07-10 – 2018-07-11 (×2): 1.5 g via INTRAVENOUS
  Filled 2018-07-10 (×2): qty 1.5

## 2018-07-10 MED ORDER — IOHEXOL 350 MG/ML SOLN
INTRAVENOUS | Status: DC | PRN
  Administered 2018-07-10: 8 mL via INTRAVENOUS

## 2018-07-10 MED ORDER — MIDAZOLAM HCL 5 MG/5ML IJ SOLN
INTRAMUSCULAR | Status: AC
  Filled 2018-07-10: qty 5

## 2018-07-10 MED ORDER — SODIUM CHLORIDE 0.9% FLUSH: 10.0000 mL | INTRAVENOUS | Status: DC | PRN

## 2018-07-10 MED ORDER — CHLORHEXIDINE GLUCONATE CLOTH 2 % EX PADS
6.0000 | MEDICATED_PAD | Freq: Every day | CUTANEOUS | Status: DC
  Administered 2018-07-10 – 2018-07-11 (×2): 6 via TOPICAL

## 2018-07-10 MED ORDER — MIDAZOLAM HCL 5 MG/5ML IJ SOLN
INTRAMUSCULAR | Status: DC | PRN
  Administered 2018-07-10: 1 mg via INTRAVENOUS

## 2018-07-10 MED ORDER — LIDOCAINE HCL (PF) 1 % IJ SOLN
INTRAMUSCULAR | Status: DC | PRN
  Administered 2018-07-10: 45 mL

## 2018-07-10 MED ORDER — SODIUM CHLORIDE 0.9 % IV SOLN: INTRAVENOUS | Status: DC

## 2018-07-10 MED ORDER — HEPARIN (PORCINE) IN NACL 1000-0.9 UT/500ML-% IV SOLN
INTRAVENOUS | Status: DC | PRN
  Administered 2018-07-10: 500 mL

## 2018-07-10 MED ORDER — CHLORHEXIDINE GLUCONATE 4 % EX LIQD: 60.0000 mL | Freq: Once | CUTANEOUS | Status: DC

## 2018-07-10 MED ORDER — SODIUM CHLORIDE 0.9% FLUSH
3.0000 mL | Freq: Two times a day (BID) | INTRAVENOUS | Status: DC
  Administered 2018-07-10: 3 mL via INTRAVENOUS

## 2018-07-10 MED ORDER — FENTANYL CITRATE (PF) 100 MCG/2ML IJ SOLN
INTRAMUSCULAR | Status: AC
  Filled 2018-07-10: qty 2

## 2018-07-10 MED ORDER — CHLORHEXIDINE GLUCONATE 4 % EX LIQD
60.0000 mL | Freq: Once | CUTANEOUS | Status: AC
  Administered 2018-07-10: 4 via TOPICAL
  Filled 2018-07-10: qty 60

## 2018-07-10 MED ORDER — ACETAMINOPHEN 325 MG PO TABS: 325.0000 mg | ORAL_TABLET | ORAL | Status: DC | PRN

## 2018-07-10 MED ORDER — SODIUM CHLORIDE 0.9 % IV SOLN
80.0000 mg | INTRAVENOUS | Status: AC
  Administered 2018-07-10: 80 mg

## 2018-07-10 MED ORDER — SODIUM CHLORIDE 0.9 % IV SOLN
INTRAVENOUS | Status: AC
  Filled 2018-07-10: qty 2

## 2018-07-10 MED ORDER — FENTANYL CITRATE (PF) 100 MCG/2ML IJ SOLN
INTRAMUSCULAR | Status: DC | PRN
  Administered 2018-07-10: 12.5 ug via INTRAVENOUS

## 2018-07-10 MED ORDER — DEXTROSE 5 % IV SOLN
3.0000 g | INTRAVENOUS | Status: AC
  Administered 2018-07-10: 3 g via INTRAVENOUS
  Filled 2018-07-10: qty 3000

## 2018-07-10 MED ORDER — HEPARIN (PORCINE) IN NACL 1000-0.9 UT/500ML-% IV SOLN
INTRAVENOUS | Status: AC
  Filled 2018-07-10: qty 500

## 2018-07-10 MED ORDER — ONDANSETRON HCL 4 MG/2ML IJ SOLN: 4.0000 mg | Freq: Four times a day (QID) | INTRAMUSCULAR | Status: DC | PRN

## 2018-07-10 MED ORDER — SODIUM CHLORIDE 0.9% FLUSH: 3.0000 mL | INTRAVENOUS | Status: DC | PRN

## 2018-07-10 MED ORDER — SODIUM CHLORIDE 0.9% FLUSH
10.0000 mL | Freq: Two times a day (BID) | INTRAVENOUS | Status: DC
  Administered 2018-07-10 (×3): 10 mL

## 2018-07-10 MED ORDER — CEFAZOLIN SODIUM-DEXTROSE 2-4 GM/100ML-% IV SOLN
2.0000 g | Freq: Three times a day (TID) | INTRAVENOUS | Status: DC
  Filled 2018-07-10 (×2): qty 100

## 2018-07-10 SURGICAL SUPPLY — 8 items
CABLE SURGICAL S-101-97-12 (CABLE) ×3 IMPLANT
HOVERMATT SINGLE USE (MISCELLANEOUS) ×2 IMPLANT
LEAD SOLIA S PRO MRI 53 (Lead) ×2 IMPLANT
LEAD SOLIA S PRO MRI 60 (Lead) ×2 IMPLANT
PACEMAKER EDORA 8DR-T MRI (Pacemaker) ×2 IMPLANT
PAD PRO RADIOLUCENT 2001M-C (PAD) ×3 IMPLANT
SHEATH CLASSIC 7F (SHEATH) ×4 IMPLANT
TRAY PACEMAKER INSERTION (PACKS) ×3 IMPLANT

## 2018-07-10 NOTE — Progress Notes (Signed)
  Echocardiogram 2D Echocardiogram has been performed.  Gerda Diss 07/10/2018, 9:25 AM

## 2018-07-10 NOTE — Progress Notes (Addendum)
HEART AND VASCULAR CENTER   MULTIDISCIPLINARY HEART VALVE TEAM  Patient Name: Dawn Gonzales Date of Encounter: 07/10/2018  Primary Cardiologist: Dr. Diona Browner / Dr. Excell Seltzer & Dr. Cornelius Moras (TAVR)  Hospital Problem List     Principal Problem:   S/P TAVR (transcatheter aortic valve replacement) Active Problems:   Severe aortic stenosis   Obesity, Class III, BMI 40-49.9 (morbid obesity) (HCC)   Right bundle branch block (RBBB)   Sleep apnea   Hypertension   Hyperlipemia   Chronic diastolic CHF (congestive heart failure), NYHA class 2 (HCC)   Neuropathy   Physical deconditioning   History of TIA (transient ischemic attack)    Subjective   No complaints. Still on bed rest. Waiting for pacemaker placement  Inpatient Medications    Scheduled Meds: . aspirin EC  81 mg Oral Daily  . chlorhexidine  60 mL Topical Once  . Chlorhexidine Gluconate Cloth  6 each Topical Daily  . clopidogrel  75 mg Oral Q breakfast  . ferrous sulfate  325 mg Oral Q breakfast  . gabapentin  300 mg Oral TID  . gentamicin irrigation  80 mg Irrigation On Call  . pravastatin  20 mg Oral QHS  . sodium chloride flush  10-40 mL Intracatheter Q12H  . sodium chloride flush  3 mL Intravenous Q12H  . sodium chloride flush  3 mL Intravenous Q12H  . venlafaxine  37.5 mg Oral Daily   Continuous Infusions: . sodium chloride 10 mL/hr at 07/10/18 0700  . sodium chloride    . sodium chloride    .  ceFAZolin (ANCEF) IV    . cefUROXime (ZINACEF)  IV Stopped (07/10/18 0452)  . nitroGLYCERIN    . phenylephrine (NEO-SYNEPHRINE) Adult infusion     PRN Meds: sodium chloride, acetaminophen **OR** acetaminophen, metoprolol tartrate, morphine injection, ondansetron (ZOFRAN) IV, oxyCODONE, sodium chloride flush, sodium chloride flush, sodium chloride flush, traMADol   Vital Signs    Vitals:   07/10/18 0500 07/10/18 0600 07/10/18 0700 07/10/18 0736  BP: (!) 103/54 (!) 106/50 (!) 109/45   Pulse: 63 67 61   Resp: 15 14  18    Temp:    98.6 F (37 C)  TempSrc:    Oral  SpO2: 99% 98% 97%   Weight:  126.4 kg      Intake/Output Summary (Last 24 hours) at 07/10/2018 0848 Last data filed at 07/10/2018 0700 Gross per 24 hour  Intake 2751.69 ml  Output 575 ml  Net 2176.69 ml   Filed Weights   07/10/18 0600  Weight: 126.4 kg    Physical Exam   GEN: obese laying in bed HEENT: Grossly normal.  Neck: Supple, no JVD, carotid bruits, or masses. Cardiac: RRR, soft flow murmur. No rubs, or gallops. No clubbing, cyanosis. Trace LE edema with chronic venous stasis changes. Radials/DP/PT 2+ and equal bilaterally.  Respiratory:  Respirations regular and unlabored, clear to auscultation bilaterally. GI: Soft, nontender, nondistended, BS + x 4. MS: no deformity or atrophy. Skin: warm and dry, no rash. Groin sites stable with temp wire in place Neuro:  Strength and sensation are intact. Psych: AAOx3.  Normal affect.  Labs    CBC Recent Labs    07/09/18 1145 07/10/18 0723  WBC  --  8.5  HGB 11.6* 10.5*  HCT 34.0* 35.4*  MCV  --  95.9  PLT  --  177   Basic Metabolic Panel Recent Labs    51/88/41 1145 07/10/18 0723  NA 141 140  K 4.3 4.2  CL  --  106  CO2  --  29  GLUCOSE 118* 101*  BUN  --  19  CREATININE  --  0.68  CALCIUM  --  8.8*  MG  --  1.8   Liver Function Tests No results for input(s): AST, ALT, ALKPHOS, BILITOT, PROT, ALBUMIN in the last 72 hours. No results for input(s): LIPASE, AMYLASE in the last 72 hours. Cardiac Enzymes No results for input(s): CKTOTAL, CKMB, CKMBINDEX, TROPONINI in the last 72 hours. BNP Invalid input(s): POCBNP D-Dimer No results for input(s): DDIMER in the last 72 hours. Hemoglobin A1C No results for input(s): HGBA1C in the last 72 hours. Fasting Lipid Panel No results for input(s): CHOL, HDL, LDLCALC, TRIG, CHOLHDL, LDLDIRECT in the last 72 hours. Thyroid Function Tests No results for input(s): TSH, T4TOTAL, T3FREE, THYROIDAB in the last 72 hours.   Invalid input(s): FREET3  Telemetry    Sinus with intermittent pacing- Personally Reviewed  ECG    Sinus, RBBB HR 65 - Personally Reviewed  Radiology    Dg Chest Port 1 View  Result Date: 07/09/2018 CLINICAL DATA:  Status post transcatheter aortic valve replacement EXAM: PORTABLE CHEST 1 VIEW COMPARISON:  July 05, 2018 FINDINGS: A left central line has been placed in the interval and appears to terminate in the central left brachiocephalic vein. Stable cardiomegaly. The patient is status post TAVR. A pacer lead projects over the right ventricle. The lungs are clear. The retrocardiac region is not well assessed. IMPRESSION: 1. The patient is status post a TAVR procedure. 2. A single pacemaker lead is identified. 3. A left central line terminates in the central left brachiocephalic vein. No pneumothorax. 4. No other abnormalities. Electronically Signed   By: Gerome Sam III M.D   On: 07/09/2018 11:54    Cardiac Studies   TAVR OPERATIVE NOTE   Date of Procedure:                07/09/2018  Preoperative Diagnosis:      Severe Aortic Stenosis   Postoperative Diagnosis:    Same   Procedure:        Transcatheter Aortic Valve Replacement - Percutaneous Right Transfemoral Approach             Edwards Sapien 3 THV (size 26 mm, model # 9600TFX, serial # [id number])              Co-Surgeons:                        Salvatore Decent. Cornelius Moras, MD and Tonny Bollman, MD  Anesthesiologist:                  Gearldine Bienenstock, MD  Echocardiographer:              Tobias Alexander, MD  Pre-operative Echo Findings: ? Severe aortic stenosis ? Normal left ventricular systolic function  Post-operative Echo Findings: ? Mild paravalvular leak ? Normal left ventricular systolic function   __________________   Echo 07/10/18: pending   Patient Profile     Dawn Gonzales is a 78 y.o. female with a history of prior mitral valve endocarditis in the setting of a cellulitis in 2019,  RBBB, chronic diastolic CHF, TIA 2017, cephalic vein thrombosis 2018, morbid obesity, HLD, severe physical deconditioning and severe AS who presented to [facility] on 07/09/18 for planned TAVR.   Assessment & Plan    Severe AS: s/p successful TAVR with a 26 mm Edwards Sapien  3 THV via the TF approach on 07/09/18. Post operative echo pending. Groin sites are stable with temp wire in place. ECG with sinus and RBBB. Continue Asprin and plavix.   CHB: she had a baseline RBBB prior to surgery and had intra procedural CHB. Temp wire was left in place and intermittently pacing overnight. Plan for PPM by Dr. Ladona Ridgel today  HTN: BP has been in good range  HLD: continue statin   Physical deconditioning: plan to mobilize patient after PPM  Signed, Cline Crock, PA-C  07/10/2018, 8:48 AM  Pager [phone]   I have seen and examined the patient and agree with the assessment and plan as outlined.  Purcell Nails, MD 07/10/2018 8:53 AM

## 2018-07-10 NOTE — Progress Notes (Addendum)
Site area: RFV Site Prior to Removal:  Level 0 Pressure Applied For:15 min Manual:  yes  Patient Status During Pull:  stable Post Pull Site:  Level 0 Post Pull Instructions Given:  yes Post Pull Pulses Present: palpable Dressing Applied:  clear Bedrest begins @ 1445 Comments: by Rolly Salter

## 2018-07-11 ENCOUNTER — Inpatient Hospital Stay (HOSPITAL_COMMUNITY): Payer: Medicare PPO

## 2018-07-11 ENCOUNTER — Encounter (HOSPITAL_COMMUNITY): Payer: Self-pay | Admitting: Internal Medicine

## 2018-07-11 DIAGNOSIS — N289 Disorder of kidney and ureter, unspecified: Secondary | ICD-10-CM

## 2018-07-11 LAB — CBC
HCT: 33.9 % — ABNORMAL LOW (ref 36.0–46.0)
Hemoglobin: 10.6 g/dL — ABNORMAL LOW (ref 12.0–15.0)
MCH: 29 pg (ref 26.0–34.0)
MCHC: 31.3 g/dL (ref 30.0–36.0)
MCV: 92.9 fL (ref 80.0–100.0)
Platelets: 161 10*3/uL (ref 150–400)
RBC: 3.65 MIL/uL — ABNORMAL LOW (ref 3.87–5.11)
RDW: 14.6 % (ref 11.5–15.5)
WBC: 7.6 10*3/uL (ref 4.0–10.5)
nRBC: 0 % (ref 0.0–0.2)

## 2018-07-11 LAB — BASIC METABOLIC PANEL
Anion gap: 6 (ref 5–15)
BUN: 19 mg/dL (ref 8–23)
CO2: 29 mmol/L (ref 22–32)
Calcium: 8.4 mg/dL — ABNORMAL LOW (ref 8.9–10.3)
Chloride: 103 mmol/L (ref 98–111)
Creatinine, Ser: 0.78 mg/dL (ref 0.44–1.00)
GFR calc Af Amer: >60 mL/min (ref >60)
GFR calc non Af Amer: >60 mL/min (ref >60)
Glucose, Bld: 90 mg/dL (ref 70–99)
Potassium: 4.2 mmol/L (ref 3.5–5.1)
Sodium: 138 mmol/L (ref 135–145)

## 2018-07-11 MED ORDER — MUPIROCIN 2 % EX OINT: 1.0000 "application " | TOPICAL_OINTMENT | Freq: Two times a day (BID) | CUTANEOUS | Status: DC

## 2018-07-11 MED ORDER — CHLORHEXIDINE GLUCONATE CLOTH 2 % EX PADS: 6.0000 | MEDICATED_PAD | Freq: Every day | CUTANEOUS | Status: DC

## 2018-07-11 MED ORDER — CLOPIDOGREL BISULFATE 75 MG PO TABS: 75.0000 mg | ORAL_TABLET | Freq: Every day | ORAL | 1 refills | Status: DC

## 2018-07-11 NOTE — Progress Notes (Addendum)
Progress Note  Patient Name: Dawn Gonzales Date of Encounter: 07/11/2018  Primary Cardiologist: Verne Carrow, MD   Subjective   Feels well, no CP or SOB  Inpatient Medications    Scheduled Meds: . aspirin EC  81 mg Oral Daily  . Chlorhexidine Gluconate Cloth  6 each Topical Daily  . clopidogrel  75 mg Oral Q breakfast  . ferrous sulfate  325 mg Oral Q breakfast  . gabapentin  300 mg Oral TID  . pravastatin  20 mg Oral QHS  . sodium chloride flush  10-40 mL Intracatheter Q12H  . sodium chloride flush  3 mL Intravenous Q12H  . venlafaxine  37.5 mg Oral Daily   Continuous Infusions: . sodium chloride 10 mL/hr at 07/10/18 1100  . nitroGLYCERIN    . phenylephrine (NEO-SYNEPHRINE) Adult infusion     PRN Meds: sodium chloride, acetaminophen **OR** acetaminophen, acetaminophen, metoprolol tartrate, morphine injection, ondansetron (ZOFRAN) IV, ondansetron (ZOFRAN) IV, oxyCODONE, sodium chloride flush, sodium chloride flush, traMADol   Vital Signs    Vitals:   07/11/18 0300 07/11/18 0400 07/11/18 0500 07/11/18 0600  BP: (!) 80/34 (!) 104/47 (!) 105/49 (!) 103/44  Pulse: 63 63 63 63  Resp: 19 18 17 16   Temp:  98.2 F (36.8 C)    TempSrc:  Oral    SpO2: 95% 96% 96% 97%  Weight:        Intake/Output Summary (Last 24 hours) at 07/11/2018 1026 Last data filed at 07/10/2018 2200 Gross per 24 hour  Intake 1115.32 ml  Output 400 ml  Net 715.32 ml   Last 3 Weights 07/10/2018 07/05/2018 07/01/2018  Weight (lbs) 278 lb 10.6 oz 269 lb 6.4 oz 269 lb  Weight (kg) 126.4 kg 122.199 kg 122.018 kg      Telemetry    SR - Personally Reviewed  ECG    SR, RBBB - Personally Reviewed  Physical Exam   GEN: No acute distress.   Neck: No JVD Cardiac: RRR, no murmurs, rubs, or gallops.  Respiratory: CTA b/l. GI: Soft, nontender, obese MS: chronic looking brawny type edema, skin changes b/l LE Neuro:  Nonfocal  Psych: Normal affect   PPM implant sit: bulky dressing  removed, no hematoma, dry  Labs    Chemistry Recent Labs  Lab 07/05/18 1019  07/09/18 1145 07/10/18 0723 07/11/18 0336  NA 138   < > 141 140 138  K 4.0   < > 4.3 4.2 4.2  CL 105  --   --  106 103  CO2 24  --   --  29 29  GLUCOSE 99   < > 118* 101* 90  BUN 28*  --   --  19 19  CREATININE 0.74  --   --  0.68 0.78  CALCIUM 9.4  --   --  8.8* 8.4*  PROT 6.9  --   --   --   --   ALBUMIN 3.6  --   --   --   --   AST 18  --   --   --   --   ALT 13  --   --   --   --   ALKPHOS 75  --   --   --   --   BILITOT 1.1  --   --   --   --   GFRNONAA >60  --   --  >60 >60  GFRAA >60  --   --  >60 >60  ANIONGAP  9  --   --  5 6   < > = values in this interval not displayed.     Hematology Recent Labs  Lab 07/05/18 1019  07/09/18 1145 07/10/18 0723 07/11/18 0336  WBC 6.8  --   --  8.5 7.6  RBC 4.49  --   --  3.69* 3.65*  HGB 12.9   < > 11.6* 10.5* 10.6*  HCT 42.5   < > 34.0* 35.4* 33.9*  MCV 94.7  --   --  95.9 92.9  MCH 28.7  --   --  28.5 29.0  MCHC 30.4  --   --  29.7* 31.3  RDW 14.8  --   --  14.8 14.6  PLT 219  --   --  177 161   < > = values in this interval not displayed.    Cardiac EnzymesNo results for input(s): TROPONINI in the last 168 hours. No results for input(s): TROPIPOC in the last 168 hours.   BNP Recent Labs  Lab 07/05/18 1020  BNP 174.4*     DDimer No results for input(s): DDIMER in the last 168 hours.   Radiology    07/11/2018: CXR FINDINGS: Dual-chamber pacer from the left leads over the right atrial appendage and right ventricle. Transcatheter aortic valve replacement. Stable cardiopericardial enlargement. Stable low lung volumes with atelectasis. No pneumothorax.  Severely limited lateral view due to difficulty with positioning.  IMPRESSION: No complicating features after dual-chamber pacer implant  Cardiac Studies   02/01/18: TTE Study Conclusions - Left ventricle: The cavity size was normal. Wall thickness was increased in a  pattern of severe LVH. Systolic function was normal. The estimated ejection fraction was in the range of 55% to 60%. Wall motion was normal; there were no regional wall motion abnormalities. Doppler parameters are consistent with abnormal left ventricular relaxation (grade 1 diastolic dysfunction). Doppler parameters are consistent with high ventricular filling pressure. - Aortic valve: Severely calcified annulus. Trileaflet; severely thickened leaflets. There was severe stenosis. Mean gradient (S): 51 mm Hg. VTI ratio of LVOT to aortic valve: 0.18. Valve area (VTI): 0.67 cm^2. Valve area (Vmax): 0.76 cm^2. Valve area (Vmean): 0.67 cm^2. - Mitral valve: Severely calcified annulus. The findings are consistent with mild stenosis. Mean gradient (D): 3 mm Hg. Valve area by pressure half-time: 2.18 cm^2. Valve area by continuity equation (using LVOT flow): 1.8 cm^2. - Left atrium: The atrium was moderately dilated. - Atrial septum: There was increased thickness of the septum, consistent with lipomatous hypertrophy. No defect or patent foramen ovale was identified.  By Dr. Clifton James notes: She had a cardiac catheterization in [place], [place] on 06/20/17. I personally reviewed those cath films and she has minimal CAD.Right heart pressures (RA 10, RV 45/9, PA: 53/13 PW: 16 AO: 108/67, LV 173/15/25. CO 7.24 L/min, CI: 4.4 L/min/m2, AVA 1.08 cm2, AV gadient 45 mmHg).    Patient Profile     78 y.o. female with a hx of prior mitral valve endocarditis (in the setting of a cellulitis in 2019, though no valve surgery), chronic diastolic CHF, TIA 2017, cephalic vein thrombosis 2018, morbid obesity, HLD, and severe AS >> s/p TAVR.  Intra-procedure with CHB/asystole  Assessment & Plan     1. VHD w/severe AS     S/p TAVR 2. Intra-procedural CHB     Baseline RBBB     S/p PPM yesterday     Site is stable     Device check this AM with intact function, stable  measurements      CXR this AM without ptx     Wound care and activity restrictions were discussed with the pateint  Routine EP follow up is in place  NO PLAVIX UNTIL MONDAY PLEASE Discussed with structural heart PA  Continue management of patient with primary team EP remains available, please recall if needed   For questions or updates, please contact [facility] Please consult [email or web address] for contact info under     Signed, Sheilah PigeonRenee Lynn Ursuy, PA-C  07/11/2018, 10:26 AM    EP Attending  Patient seen and examined. Her PPM has been interrogated under my supervision. She is stable for DC from my perspective. Usual EP followup. I would like her to hold her plavix for 3 days to reduce the risk of pocket hematoma.  Leonia ReevesGregg Taylor,M.D.

## 2018-07-11 NOTE — Progress Notes (Signed)
CARDIAC REHAB PHASE I   PRE:  Rate/Rhythm: 66 SR    BP: sitting 108/42    SaO2: 95 RA  MODE:  Ambulation: stood, sat, stood again and 2 steps to pivot to recliner   POST:  Rate/Rhythm: 92 SR    BP: sitting 160/73 (took a long time to get after mobility)     SaO2: 90-91 RA  Pt planning d/c but hadn't been out of bed yet. Her baseline mobility is 60 ft in house with RW. She was able to slowly move to EOB and stood slowly with RW in front of her. x2 assist and gait belt for support. However once standing she felt very weak. She stood at EOB for 2 min, slightly stepping in place (low steps) but did not feel she could take steps. She sat back on EOB to rest. We moved recliner close to her.  She stood and took 2 steps to pivot to recliner and plopped down. She was somewhat anxious with mobility. Her daughter is planning on staying with her but it appears she needs increased mobility before d/c. Suggest PT  And/or nursing to work with her this pm. Left pt in recliner, encouraging her to pump her legs there. BP up with activity. [id number]  Harriet Masson CES, ACSM 07/11/2018 11:39 AM

## 2018-07-11 NOTE — Discharge Summary (Addendum)
[facility]   MULTIDISCIPLINARY HEART VALVE TEAM  Discharge Summary    Patient ID: Dawn Gonzales MRN: [id number]; DOB: January 01, 1941  Admit date: 07/09/2018 Discharge date: 07/11/2018  Primary Care Provider: Benita Stabile, MD  Primary Cardiologist: Dr. Diona Browner / Dr. Excell Seltzer & Dr. Cornelius Moras (TAVR)   Discharge Diagnoses    Principal Problem:   S/P TAVR (transcatheter aortic valve replacement) Active Problems:   Severe aortic stenosis   Obesity, Class III, BMI 40-49.9 (morbid obesity) (HCC)   Right bundle branch block (RBBB)   Sleep apnea   Hypertension   Hyperlipemia   Chronic diastolic CHF (congestive heart failure), NYHA class 2 (HCC)   Neuropathy   Physical deconditioning   History of TIA (transient ischemic attack)   Kidney lesion, native, right   Allergies No Known Allergies  Diagnostic Studies/Procedures    TAVR OPERATIVE NOTE   Date of Procedure:07/09/2018  Preoperative Diagnosis:Severe Aortic Stenosis   Postoperative Diagnosis:Same   Procedure:   Transcatheter Aortic Valve Replacement - PercutaneousRightTransfemoral Approach Edwards Sapien 3 THV (size 26mm, model # 9600TFX, serial # [id number])  Co-Surgeons:Clarence H. Cornelius Moras, MD and Tonny Bollman, MD  Anesthesiologist:William Traci Sermon, MD  Delano Metz, MD  Pre-operative Echo Findings: ? Severe aortic stenosis ? Normalleft ventricular systolic function  Post-operative Echo Findings: ? Mildparavalvular leak ? Normalleft ventricular systolic function   __________________   Echo 07/10/18: IMPRESSIONS  1. A 26 an Edwards Edwards Sapien bioprosthetic aortic valve (TAVR) valve is present in the aortic position. Procedure Date: 07/09/2018 Normal aortic valve prosthesis. Echo findings are consistent with perivalvular leak of the aortic prosthesis.  2.  Aortic valve regurgitation is mild by color flow Doppler.  3. Two jets of perivalvular leak, one trivial and one mild, in the area of the prior right and left coronary cusps.  4. The left ventricle has normal systolic function, with an ejection fraction of 60-65%. The cavity size was normal. There is severe concentric left ventricular hypertrophy. Left ventricular diastolic Doppler parameters are consistent with pseudonormal.  5. The right ventricle has normal systolc function. The cavity was normal. There is no increase in right ventricular wall thickness.  6. Left atrial size was moderately dilated.  7. Trivial pericardial effusion is present.  8. The pericardial effusion is posterior to the left ventricle.  9. Mild thickening of the mitral valve leaflet. Mild calcification of the mitral valve leaflet, with mildly decreased mobility. There is severe mitral annular calcification present. Mild mitral valve stenosis. 10. The pulmonic valve was grossly normal. Pulmonic valve regurgitation is mild by color flow Doppler. 11. The aortic root and ascending aorta are normal in size and structure.  __________________   07/10/18 PACEMAKER IMPLANT  Conclusion CONCLUSIONS:   1. 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 No early apparent complications.         History of Present Illness     Dawn Gonzales is a 78 y.o. 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  Patient states that she has been told that she had a heart murmur for many years. Approximately 1 year ago she was hospitalized for mitral valve endocarditis that occurred in the setting of cellulitis.  She was diagnosed with  severe symptomatic aortic stenosis  at that time.  She was evaluated in [place] where she underwent diagnostic cardiac catheterization that revealed minimal nonobstructive coronary artery disease, mildly elevated right heart pressures, severe aortic stenosis.  She was noted to have poor dentition and dental extraction was recommended.  At that time she still had a biliary drain in place having prior to that presented with acute cholecystitis complicated by choledocho cholelithiasis. Several months later she moved to [place] where she now lives in [place] close to her daughter. She was admitted to any [facility] October 2019 with recurrent acute cholecystitis complicated by choledocholithiasis and sepsis.  Transthoracic echocardiogram performed at that time revealed severe aortic stenosis with normal left ventricular systolic function. She was referred to general surgery but ultimately underwent ERCP with successful stone extraction and biliary sphincterotomy.  Since then she has been seen in follow-up by Dr. Lessie Dings from [facility] to consider elective cholecystectomy.  She was felt to be a poor candidate and very high risk because of presence of severe aortic stenosis.  She was subsequently referred to the multidisciplinary heart valve clinic and has been evaluated previously by Dr. Clifton James.  Dental consultation was requested and she recently underwent dental extraction.   The patient has been evaluated by the multidisciplinary valve team and felt to have severe, symptomatic aortic stenosis and to be a suitable candidate for TAVR, which was set up for 07/09/18.  Hospital Course     Consultants: EP    Severe AS:s/p successful TAVR with a 26 mm Edwards Sapien 3 THV via the TF approach on 07/09/18. Post operative echo showed EF 60%, normally functioning TAVR with mean gradient of  15.6 mmHg and two jets of trivial and mild PVL. Groin sites are stable. ECG with sinus and  RBBB. Continue Asprin and plavix, but she will hold plavix until Monday 3/16 given recent PPM   CHB: she had a baseline RBBB prior to surgery and had intra procedural CHB. Temp wire was left in place and she underwent Biotronik dual-chamber pacemaker by Dr. Ladona Ridgel on 07/10/18. Dr. Ladona Ridgel has requested that she hold her plavix x 3 days and resume on Monday 07/15/18  HTN: BP has been in good range  HLD: continue statin   Physical deconditioning: she has baseline physical debilitation. Hopefully, she can become more active now that he valve has been replaced. Will order home PT in the outpatient setting.   Dispo: Plan for DC home.  I had a long discussion with her daughter who will stay with her over the next few days.  R Kidney lesion: incidental finding on pre TAVR CT scans. Will require follow up with a MRI. I will discuss this with her as an outpatient.  _____________  Discharge Vitals Blood pressure (!) 105/40, pulse 66, temperature 98.4 F (36.9 C), temperature source Oral, resp. rate (!) 22, weight 126.4 kg, SpO2 96 %.  Filed Weights   07/10/18 0600  Weight: 126.4 kg   \VS:  BP (!) 105/40    Pulse 66    Temp 98.4 F (36.9 C) (Oral)    Resp (!) 22    Wt 126.4 kg    SpO2 96%    BMI 44.98 kg/m    GEN: chronically ill, obese woman HEENT: normal Neck: no JVD or masses Cardiac: RRR; 2/6 SEM @ RUSB. No rubs, or gallops,no edema  Respiratory:  clear to auscultation bilaterally, normal work of breathing GI: soft, nontender, nondistended, + BS MS: no deformity or atrophy Skin:  warm and dry, no rash. Groin sites healing without hematoma or ecchymosis Neuro:  Alert and Oriented x 3, Strength and sensation are intact Psych: euthymic mood, full affect    Labs & Radiologic Studies    CBC Recent Labs    07/10/18 0723 07/11/18 0336  WBC 8.5 7.6  HGB 10.5* 10.6*  HCT 35.4* 33.9*  MCV 95.9 92.9  PLT 177 161   Basic Metabolic Panel Recent Labs    16/01/9602/11/20 0723 07/11/18 0336    NA 140 138  K 4.2 4.2  CL 106 103  CO2 29 29  GLUCOSE 101* 90  BUN 19 19  CREATININE 0.68 0.78  CALCIUM 8.8* 8.4*  MG 1.8  --    Liver Function Tests No results for input(s): AST, ALT, ALKPHOS, BILITOT, PROT, ALBUMIN in the last 72 hours. No results for input(s): LIPASE, AMYLASE in the last 72 hours. Cardiac Enzymes No results for input(s): CKTOTAL, CKMB, CKMBINDEX, TROPONINI in the last 72 hours. BNP Invalid input(s): POCBNP D-Dimer No results for input(s): DDIMER in the last 72 hours. Hemoglobin A1C No results for input(s): HGBA1C in the last 72 hours. Fasting Lipid Panel No results for input(s): CHOL, HDL, LDLCALC, TRIG, CHOLHDL, LDLDIRECT in the last 72 hours. Thyroid Function Tests No results for input(s): TSH, T4TOTAL, T3FREE, THYROIDAB in the last 72 hours.  Invalid input(s): FREET3 _____________  Dg Chest 2 View  Result Date: 07/11/2018 CLINICAL DATA:  Pacemaker placement EXAM: CHEST - 2 VIEW COMPARISON:  Two days ago FINDINGS: Dual-chamber pacer from the left leads over the right atrial appendage and right ventricle. Transcatheter aortic valve replacement. Stable cardiopericardial enlargement. Stable low lung volumes with atelectasis. No pneumothorax. Severely limited lateral view due to difficulty with positioning. IMPRESSION: No complicating features after dual-chamber pacer implant Electronically Signed   By: Marnee SpringJonathon  Watts M.D.   On: 07/11/2018 09:38   Dg Chest 2 View  Result Date: 07/05/2018 CLINICAL DATA:  Pre admit for TAVR,,former smoker ,,no chest comp today EXAM: CHEST - 2 VIEW COMPARISON:  Chest CT on 05/30/2018 FINDINGS: Marked, stable cardiomegaly. Prominent pulmonary vasculature is stable. There are no focal consolidations or pleural effusions. No pulmonary edema. Chronic changes in both shoulders. IMPRESSION: Stable cardiomegaly. Electronically Signed   By: Norva PavlovElizabeth  Brown M.D.   On: 07/05/2018 15:51   Dg Chest Port 1 View  Result Date:  07/09/2018 CLINICAL DATA:  Status post transcatheter aortic valve replacement EXAM: PORTABLE CHEST 1 VIEW COMPARISON:  July 05, 2018 FINDINGS: A left central line has been placed in the interval and appears to terminate in the central left brachiocephalic vein. Stable cardiomegaly. The patient is status post TAVR. A pacer lead projects over the right ventricle. The lungs are clear. The retrocardiac region is not well assessed. IMPRESSION: 1. The patient is status post a TAVR procedure. 2. A single pacemaker lead is identified. 3. A left central line terminates in the central left brachiocephalic vein. No pneumothorax. 4. No other abnormalities. Electronically Signed   By: Gerome Samavid  Williams III M.D   On: 07/09/2018 11:54   Disposition   Pt is being discharged home today in good condition.  Follow-up Plans & Appointments    Follow-up Information    [facility] [address] [facility] up.   Specialty:  Cardiology Why:  07/22/2018 @ 12:00PM (noon), pacemaker wound check visit  Contact information: [address], [address] [place] [id number] [phone]       Marinus Mawaylor, Gregg W, MD Follow up.  Specialty:  Cardiology Why:  10/15/2018 @ 11:15AM Contact information: [address] [place] [address] [phone]        Janetta Hora, PA-C. Go on 07/18/2018.   Specialties:  Cardiology, Radiology Why:  @ 1:30pm, please arrive at least 10 minutes early Contact information: [address] [place] [address] [phone]            Discharge Medications   Allergies as of 07/11/2018   No Known Allergies     Medication List    STOP taking these medications   oxyCODONE 5 MG immediate release tablet Commonly known as:  Oxy IR/ROXICODONE     TAKE these medications   acetaminophen 650 MG CR tablet Commonly known as:  TYLENOL Take 1,300 mg by mouth every 8 (eight) hours as needed for pain.   aspirin EC 81 MG  tablet Take 81 mg by mouth daily.   CALCIUM-CARB 600 PO Take 600 mg by mouth daily.   cholecalciferol 1000 units tablet Commonly known as:  VITAMIN D Take 1,000 Units by mouth daily.   clopidogrel 75 MG tablet Commonly known as:  Plavix Take 1 tablet (75 mg total) by mouth daily. Notes to patient:  Please hold Plavix until Monday 3/16 due to your pacemaker procedure   ferrous sulfate 325 (65 FE) MG tablet Take 325 mg by mouth daily with breakfast.   gabapentin 300 MG capsule Commonly known as:  NEURONTIN Take 300 mg by mouth 3 (three) times daily.   multivitamin with minerals Tabs tablet Take 1 tablet by mouth daily.   niacin 500 MG tablet Take 500 mg by mouth daily.   nystatin ointment Commonly known as:  MYCOSTATIN Apply 1 application topically 2 (two) times daily as needed (rash).   pravastatin 20 MG tablet Commonly known as:  PRAVACHOL Take 20 mg by mouth at bedtime.   traMADol 50 MG tablet Commonly known as:  ULTRAM Take 50 mg by mouth every 6 (six) hours as needed for moderate pain.   venlafaxine 37.5 MG tablet Commonly known as:  EFFEXOR Take 37.5 mg by mouth daily.   vitamin C 500 MG tablet Commonly known as:  ASCORBIC ACID Take 500 mg by mouth daily.   ZINC OXIDE EX Apply 1 application topically daily as needed (wound care).         Outstanding Labs/Studies   none  Duration of Discharge Encounter   Greater than 30 minutes including physician time.  SignedCline Crock, PA-C 07/11/2018, 11:59 AM [phone]

## 2018-07-11 NOTE — Discharge Instructions (Signed)
Supplemental Discharge Instructions for  Pacemaker/Defibrillator Patients  Activity No heavy lifting or vigorous activity with your left/right arm for 6 to 8 weeks.  Do not raise your left/right arm above your head for one week.  Gradually raise your affected arm as drawn below.              07/14/2018               07/15/2018                 07/16/2018              07/17/2018 __  NO DRIVING for 1 week   ; you may begin driving on  0/86/7619 (or as per post valve replacement care instructions whichever is longer) .  WOUND CARE - Keep the wound area clean and dry.  Do not get this area wet for one week. No showers for one week; you may shower on  07/17/2018 (or as per post valve replacement care instructions whichever is longer)   . - The tape/steri-strips on your wound will fall off; do not pull them off.  No bandage is needed on the site.  DO  NOT apply any creams, oils, or ointments to the wound area. - If you notice any drainage or discharge from the wound, any swelling or bruising at the site, or you develop a fever > 101? F after you are discharged home, call the office at once.  Special Instructions - You are still able to use cellular telephones; use the ear opposite the side where you have your pacemaker/defibrillator.  Avoid carrying your cellular phone near your device. - When traveling through airports, show security personnel your identification card to avoid being screened in the metal detectors.  Ask the security personnel to use the hand wand. - Avoid arc welding equipment, MRI testing (magnetic resonance imaging), TENS units (transcutaneous nerve stimulators).  Call the office for questions about other devices. - Avoid electrical appliances that are in poor condition or are not properly grounded. - Microwave ovens are safe to be near or to operate.  ACTIVITY AND EXERCISE  Daily activity and exercise are an important part of your recovery. People recover at different rates  depending on their general health and type of valve procedure.  Most people recovering from TAVR feel better relatively quickly   No lifting, pushing, pulling more than 10 pounds (examples to avoid: groceries, vacuuming, gardening, golfing):             - For one week with a procedure through the groin.             - For six weeks for procedures through the chest wall or neck NOTE: You will typically see one of our providers 7-14 days after your procedure to discuss WHEN TO RESUME the above activities.      DRIVING  Do not drive for until you are seen for follow up and cleared by a provider. Generally, we ask patient to not drive for 1 week after their procedure.  If you have been told by your doctor in the past that you may not drive, you must talk with him/her before you begin driving again.     DRESSING  Groin site: you may leave the clear dressing over the site for up to one week or until it falls off.     HYGIENE  If you had a femoral (leg) procedure, you may take a shower  when you return home. After the shower, pat the site dry. Do NOT use powder, oils or lotions in your groin area until the site has completely healed.  If you had a chest procedure, you may shower when you return home unless specifically instructed not to by your discharging practitioner.             - DO NOT scrub incision; pat dry with a towel             - DO NOT apply any lotions, oils, powders to the incision             - No tub baths / swimming for at least 2 weeks.  If you notice any fevers, chills, increased pain, swelling, bleeding or pus, please contact your doctor.   ADDITIONAL INFORMATION  If you are going to have an upcoming dental procedure, please contact our office as you will require antibiotics ahead of time to prevent infection on your heart valve.    If you have any questions or concerns you can call the structural heart phone during normal business hours 8am-4pm. If you have an urgent  need after hours or weekends please call [phone] to talk to the on call provider for general cardiology. If you have an emergency that requires immediate attention, please call 911.    After TAVR Checklist  Check  Test Description   Follow up appointment in 1-2 weeks  You will see our structural heart physician assistant, Carlean Jews. Your incision sites will be checked and you will be cleared to drive and resume all normal activities if you are doing well.     1 month echo and follow up  You will have an echo to check on your new heart valve and be seen back in the office by Carlean Jews. Many times the echo is not read by your appointment time, but Florentina Addison will call you later that day or the following day to report your results.   Follow up with your primary cardiologist You will need to be seen by your primary cardiologist in the following 3-6 months after your 1 month appointment in the valve clinic. Often times your Plavix or Aspirin will be discontinued during this time, but this is decided on a case by case basis.    1 year echo and follow up You will have another echo to check on your heart valve after 1 year and be seen back in the office by Carlean Jews. This your last structural heart visit.   Bacterial endocarditis prophylaxis  You will have to take antibiotics for the rest of your life before all dental procedures (even teeth cleanings) to protect your heart valve. Antibiotics are also required before some surgeries. Please check with your cardiologist before scheduling any surgeries. Also, please make sure to tell us if you have a penicillin allergy as you will require an alternative antibiotic.

## 2018-07-12 ENCOUNTER — Telehealth: Payer: Self-pay | Admitting: Physician Assistant

## 2018-07-12 ENCOUNTER — Telehealth: Payer: Self-pay | Admitting: Internal Medicine

## 2018-07-12 ENCOUNTER — Other Ambulatory Visit: Payer: Self-pay | Admitting: Physician Assistant

## 2018-07-12 DIAGNOSIS — Z952 Presence of prosthetic heart valve: Secondary | ICD-10-CM

## 2018-07-12 NOTE — Telephone Encounter (Signed)
Spoke with patient's daughter, Huntley Dec ([place]). She reports that patient's Steri-strips have areas of brown discoloration. They are not saturated with blood and she is not actively bleeding as far at they can tell. Advised that slight oozing is common in the immediate post-implant period after Steri-strips are applied. Advised to call our office during business hours or seek medical attention over the weekend for active bleeding (Steri-strips saturated or falling off, blood on her shirt, etc.), but explained this is unlikely at this point. Huntley Dec verbalizes understanding and thanked me for my call.

## 2018-07-12 NOTE — Telephone Encounter (Signed)
  HEART AND VASCULAR CENTER   MULTIDISCIPLINARY HEART VALVE TEAM   Patient contacted regarding discharge from [facility] on 07/11/18  Patient understands to follow up with provider Carlean Jews on 3/19 at 1:30pm at [address] [facility].  Patient understands discharge instructions? yes Patient understands medications and regiment? yes Patient understands to bring all medications to this visit? Yes  Cline Crock PA-C  [facility]

## 2018-07-12 NOTE — Telephone Encounter (Signed)
New Message         Patient's daughter is calling in  Today because she is concerned about blood that is under the tape where the implant is, pls call to advise.

## 2018-07-18 ENCOUNTER — Telehealth (INDEPENDENT_AMBULATORY_CARE_PROVIDER_SITE_OTHER): Payer: Medicare PPO | Admitting: Physician Assistant

## 2018-07-18 ENCOUNTER — Ambulatory Visit: Payer: Medicare PPO | Admitting: Physician Assistant

## 2018-07-18 DIAGNOSIS — Z952 Presence of prosthetic heart valve: Secondary | ICD-10-CM

## 2018-07-18 DIAGNOSIS — I350 Nonrheumatic aortic (valve) stenosis: Secondary | ICD-10-CM

## 2018-07-18 NOTE — Telephone Encounter (Addendum)
  [facility]ART [facility] VASCULAR CENTER   MULTIDISCIPLINARY HEART VALVE TEAM  Patient contacted about TOC visit for TAVR follow up. Given Covid-19 pandemic we have asked the patient to not come into the office for appointment to limit exposure. The patient consented to do consult over the phone. The patient, Dawn Gonzales , and Dawn Jews PA-C were present during the telephone encounter.   Chief Complaint: TOC s/p TAVR.   HPI Dawn Gonzales is a 78 y.o. female with a history of prior mitral valve endocarditisin the setting of a cellulitis in 2019,RBBB,chronic diastolic CHF, TIA 2017, cephalic vein thrombosis 2018, morbid obesity, HLD,severe physical deconditioningand severe AS s/p TAVR (07/09/18) c/b CHB s/p PPM (07/10/18).   Patient is currently doing well with no new symptoms. No CP or SOB. Mild LE edema at the end of the day but no orthopnea or PND. So mild occasional dizziness with positional changes. No syncope. No blood in stool or urine. She gets some palpitations once in a while. Her groin sites are healing well with no pain.  Assessment and Plan:  Patient is doing well s/p TAVR. Groin sites are healing well. SBE prophylaxis discussed; the patient is edentulous and does not go to the dentist. Continue on aspirin and plavix.   I will see her back on 08/08/18 for 1 month echo and appt. She also has a device wound check scheduled for 3/23.    Diagnosis: s/p TAVR, ICD Z95.2. Total time spent on phone ~11 minutes.   Cline Crock PA-C  [facility]

## 2018-07-22 ENCOUNTER — Ambulatory Visit (INDEPENDENT_AMBULATORY_CARE_PROVIDER_SITE_OTHER): Payer: Medicare PPO | Admitting: *Deleted

## 2018-07-22 ENCOUNTER — Other Ambulatory Visit: Payer: Self-pay

## 2018-07-22 DIAGNOSIS — I442 Atrioventricular block, complete: Secondary | ICD-10-CM | POA: Diagnosis not present

## 2018-07-22 DIAGNOSIS — Z952 Presence of prosthetic heart valve: Secondary | ICD-10-CM | POA: Diagnosis not present

## 2018-07-22 DIAGNOSIS — Z950 Presence of cardiac pacemaker: Secondary | ICD-10-CM | POA: Diagnosis not present

## 2018-07-24 NOTE — Progress Notes (Signed)
Device Clinic PPM wound check appointment. Steri-strips removed by patient prior to appointment. Wound without redness or edema. Incision edges approximated, wound well healed. Normal device function. Thresholds, sensing, and impedances consistent with implant measurements. Auto capture programmed on for extra safety margin until 3 month visit. Histogram distribution appropriate for patient and level of activity. No mode switches or high ventricular rates noted. Patient educated about wound care, arm mobility, lifting restrictions, and home monitor. ROV with GT on 10/15/18.

## 2018-07-26 DIAGNOSIS — Z7409 Other reduced mobility: Secondary | ICD-10-CM | POA: Diagnosis not present

## 2018-07-26 DIAGNOSIS — I350 Nonrheumatic aortic (valve) stenosis: Secondary | ICD-10-CM | POA: Diagnosis not present

## 2018-07-26 DIAGNOSIS — Z993 Dependence on wheelchair: Secondary | ICD-10-CM | POA: Diagnosis not present

## 2018-07-26 LAB — CUP PACEART INCLINIC DEVICE CHECK
Brady Statistic RA Percent Paced: 1 %
Brady Statistic RV Percent Paced: 0 %
Date Time Interrogation Session: 20200323152300
Implantable Lead Implant Date: 20200311
Implantable Lead Implant Date: 20200311
Implantable Lead Location: 753859
Implantable Lead Location: 753860
Implantable Lead Model: 377
Implantable Lead Model: 377
Implantable Lead Serial Number: 81024123
Implantable Lead Serial Number: 81075341
Implantable Pulse Generator Implant Date: 20200311
Lead Channel Impedance Value: 409 Ohm
Lead Channel Impedance Value: 585 Ohm
Lead Channel Pacing Threshold Amplitude: 0.7 V
Lead Channel Pacing Threshold Amplitude: 0.7 V
Lead Channel Pacing Threshold Pulse Width: 0.4 ms
Lead Channel Pacing Threshold Pulse Width: 0.4 ms
Lead Channel Sensing Intrinsic Amplitude: 12 mV
Lead Channel Sensing Intrinsic Amplitude: 4.5 mV
Lead Channel Setting Pacing Amplitude: 1.7 V
Lead Channel Setting Pacing Amplitude: 1.7 V
Lead Channel Setting Pacing Pulse Width: 0.4 ms
Pulse Gen Model: 407145
Pulse Gen Serial Number: 69550148

## 2018-07-27 DIAGNOSIS — Z952 Presence of prosthetic heart valve: Secondary | ICD-10-CM | POA: Diagnosis not present

## 2018-07-27 DIAGNOSIS — Z48812 Encounter for surgical aftercare following surgery on the circulatory system: Secondary | ICD-10-CM | POA: Diagnosis not present

## 2018-07-27 DIAGNOSIS — I5032 Chronic diastolic (congestive) heart failure: Secondary | ICD-10-CM | POA: Diagnosis not present

## 2018-07-27 DIAGNOSIS — M545 Low back pain: Secondary | ICD-10-CM | POA: Diagnosis not present

## 2018-07-27 DIAGNOSIS — M170 Bilateral primary osteoarthritis of knee: Secondary | ICD-10-CM | POA: Diagnosis not present

## 2018-07-27 DIAGNOSIS — I2510 Atherosclerotic heart disease of native coronary artery without angina pectoris: Secondary | ICD-10-CM | POA: Diagnosis not present

## 2018-07-27 DIAGNOSIS — I110 Hypertensive heart disease with heart failure: Secondary | ICD-10-CM | POA: Diagnosis not present

## 2018-07-27 DIAGNOSIS — G629 Polyneuropathy, unspecified: Secondary | ICD-10-CM | POA: Diagnosis not present

## 2018-07-27 DIAGNOSIS — Z950 Presence of cardiac pacemaker: Secondary | ICD-10-CM | POA: Diagnosis not present

## 2018-07-31 DIAGNOSIS — G629 Polyneuropathy, unspecified: Secondary | ICD-10-CM | POA: Diagnosis not present

## 2018-07-31 DIAGNOSIS — Z48812 Encounter for surgical aftercare following surgery on the circulatory system: Secondary | ICD-10-CM | POA: Diagnosis not present

## 2018-07-31 DIAGNOSIS — I110 Hypertensive heart disease with heart failure: Secondary | ICD-10-CM | POA: Diagnosis not present

## 2018-07-31 DIAGNOSIS — I2510 Atherosclerotic heart disease of native coronary artery without angina pectoris: Secondary | ICD-10-CM | POA: Diagnosis not present

## 2018-07-31 DIAGNOSIS — M545 Low back pain: Secondary | ICD-10-CM | POA: Diagnosis not present

## 2018-07-31 DIAGNOSIS — I5032 Chronic diastolic (congestive) heart failure: Secondary | ICD-10-CM | POA: Diagnosis not present

## 2018-07-31 DIAGNOSIS — Z952 Presence of prosthetic heart valve: Secondary | ICD-10-CM | POA: Diagnosis not present

## 2018-07-31 DIAGNOSIS — M170 Bilateral primary osteoarthritis of knee: Secondary | ICD-10-CM | POA: Diagnosis not present

## 2018-07-31 DIAGNOSIS — Z950 Presence of cardiac pacemaker: Secondary | ICD-10-CM | POA: Diagnosis not present

## 2018-08-02 ENCOUNTER — Other Ambulatory Visit: Payer: Self-pay

## 2018-08-02 DIAGNOSIS — G629 Polyneuropathy, unspecified: Secondary | ICD-10-CM | POA: Diagnosis not present

## 2018-08-02 DIAGNOSIS — I110 Hypertensive heart disease with heart failure: Secondary | ICD-10-CM | POA: Diagnosis not present

## 2018-08-02 DIAGNOSIS — Z952 Presence of prosthetic heart valve: Secondary | ICD-10-CM

## 2018-08-02 DIAGNOSIS — Z48812 Encounter for surgical aftercare following surgery on the circulatory system: Secondary | ICD-10-CM | POA: Diagnosis not present

## 2018-08-02 DIAGNOSIS — M170 Bilateral primary osteoarthritis of knee: Secondary | ICD-10-CM | POA: Diagnosis not present

## 2018-08-02 DIAGNOSIS — I2510 Atherosclerotic heart disease of native coronary artery without angina pectoris: Secondary | ICD-10-CM | POA: Diagnosis not present

## 2018-08-02 DIAGNOSIS — Z950 Presence of cardiac pacemaker: Secondary | ICD-10-CM | POA: Diagnosis not present

## 2018-08-02 DIAGNOSIS — I5032 Chronic diastolic (congestive) heart failure: Secondary | ICD-10-CM | POA: Diagnosis not present

## 2018-08-02 DIAGNOSIS — M545 Low back pain: Secondary | ICD-10-CM | POA: Diagnosis not present

## 2018-08-03 DIAGNOSIS — M6281 Muscle weakness (generalized): Secondary | ICD-10-CM | POA: Diagnosis not present

## 2018-08-03 DIAGNOSIS — E661 Drug-induced obesity: Secondary | ICD-10-CM | POA: Diagnosis not present

## 2018-08-05 ENCOUNTER — Telehealth: Payer: Self-pay | Admitting: Physician Assistant

## 2018-08-05 ENCOUNTER — Encounter: Payer: Self-pay | Admitting: Thoracic Surgery (Cardiothoracic Vascular Surgery)

## 2018-08-05 NOTE — Telephone Encounter (Signed)
Is patient using Smartphone/computer/tablet? smartphone and laptop > daughter has it set up on both  Did audio/video work yes  Does patient need telephone visit? no  Best phone number to use? [phone]  Special Instructions? We will be using Daughter Berdine AddisonSarah Cavanaughs phone number to call for the [facility] meeting     Virtual Visit Pre-Appointment Phone Call  Steps For Call:  1. Confirm consent - "In the setting of the current Covid19 crisis, you are scheduled for a (phone or video) visit with your provider on (date) at (time).  Just as we do with many in-office visits, in order for you to participate in this visit, we must obtain consent.  If you'd like, I can send this to your mychart (if signed up) or email for you to review.  Otherwise, I can obtain your verbal consent now.  All virtual visits are billed to your insurance company just like a normal visit would be.  By agreeing to a virtual visit, we'd like you to understand that the technology does not allow for your provider to perform an examination, and thus may limit your provider's ability to fully assess your condition.  Finally, though the technology is pretty good, we cannot assure that it will always work on either your or our end, and in the setting of a video visit, we may have to convert it to a phone-only visit.  In either situation, we cannot ensure that we have a secure connection.  Are you willing to proceed?"  2. Give patient instructions for WebEx download to smartphone as below if video visit  3. Advise patient to be prepared with any vital sign or heart rhythm information, their current medicines, and a piece of paper and pen handy for any instructions they may receive the day of their visit  4. Inform patient they will receive a phone call 15 minutes prior to their appointment time (may be from unknown caller ID) so they should be prepared to answer  5. Confirm that appointment type is correct in Epic appointment notes  (video vs telephone)    TELEPHONE CALL NOTE  Dawn Gonzales Noa has been deemed a candidate for a follow-up tele-health visit to limit community exposure during the Covid-19 pandemic. I spoke with the patient via phone to ensure availability of phone/video source, confirm preferred email & phone number, and discuss instructions and expectations.  I reminded Dawn Gonzales Phagan to be prepared with any vital sign and/or heart rhythm information that could potentially be obtained via home monitoring, at the time of her visit. I reminded Dawn Gonzales Bartholomew to expect a phone call at the time of her visit if her visit.  Did the patient verbally acknowledge consent to treatment? yes  Dawn Gonzales 08/05/2018 10:43 AM   DOWNLOADING THE WEBEX SOFTWARE TO SMARTPHONE  - If Apple, go to [employer] Store and type in WebEx in the search bar. Download [employer] Meetings, the blue/green circle. The app is free but as with any other app downloads, their phone may require them to verify saved payment information or Apple password. The patient does NOT have to create an account.  - If Android, ask patient to go to [employer] Play Store and type in WebEx in the search bar. Download [employer] Meetings, the blue/green circle. The app is free but as with any other app downloads, their phone may require them to verify saved payment information or Android password. The patient does NOT have to create an account.  CONSENT FOR TELE-HEALTH VISIT - PLEASE REVIEW  I hereby voluntarily request, consent and authorize [facility] and its employed or contracted physicians, physician assistants, nurse practitioners or other licensed health care professionals (the Practitioner), to provide me with telemedicine health care services (the Services") as deemed necessary by the treating Practitioner. I acknowledge and consent to receive the Services by the Practitioner via telemedicine. I understand that the telemedicine visit  will involve communicating with the Practitioner through live audiovisual communication technology and the disclosure of certain medical information by electronic transmission. I acknowledge that I have been given the opportunity to request an in-person assessment or other available alternative prior to the telemedicine visit and am voluntarily participating in the telemedicine visit.  I understand that I have the right to withhold or withdraw my consent to the use of telemedicine in the course of my care at any time, without affecting my right to future care or treatment, and that the Practitioner or I may terminate the telemedicine visit at any time. I understand that I have the right to inspect all information obtained and/or recorded in the course of the telemedicine visit and may receive copies of available information for a reasonable fee.  I understand that some of the potential risks of receiving the Services via telemedicine include:   Delay or interruption in medical evaluation due to technological equipment failure or disruption;  Information transmitted may not be sufficient (e.g. poor resolution of images) to allow for appropriate medical decision making by the Practitioner; and/or   In rare instances, security protocols could fail, causing a breach of personal health information.  Furthermore, I acknowledge that it is my responsibility to provide information about my medical history, conditions and care that is complete and accurate to the best of my ability. I acknowledge that Practitioner's advice, recommendations, and/or decision may be based on factors not within their control, such as incomplete or inaccurate data provided by me or distortions of diagnostic images or specimens that may result from electronic transmissions. I understand that the practice of medicine is not an exact science and that Practitioner makes no warranties or guarantees regarding treatment outcomes. I acknowledge  that I will receive a copy of this consent concurrently upon execution via email to the email address I last provided but may also request a printed copy by calling the office of [facility].    I understand that my insurance will be billed for this visit.   I have read or had this consent read to me.  I understand the contents of this consent, which adequately explains the benefits and risks of the Services being provided via telemedicine.   I have been provided ample opportunity to ask questions regarding this consent and the Services and have had my questions answered to my satisfaction.  I give my informed consent for the services to be provided through the use of telemedicine in my medical care  By participating in this telemedicine visit I agree to the above.

## 2018-08-06 DIAGNOSIS — G629 Polyneuropathy, unspecified: Secondary | ICD-10-CM | POA: Diagnosis not present

## 2018-08-06 DIAGNOSIS — Z950 Presence of cardiac pacemaker: Secondary | ICD-10-CM | POA: Diagnosis not present

## 2018-08-06 DIAGNOSIS — I2510 Atherosclerotic heart disease of native coronary artery without angina pectoris: Secondary | ICD-10-CM | POA: Diagnosis not present

## 2018-08-06 DIAGNOSIS — M545 Low back pain: Secondary | ICD-10-CM | POA: Diagnosis not present

## 2018-08-06 DIAGNOSIS — M170 Bilateral primary osteoarthritis of knee: Secondary | ICD-10-CM | POA: Diagnosis not present

## 2018-08-06 DIAGNOSIS — Z952 Presence of prosthetic heart valve: Secondary | ICD-10-CM | POA: Diagnosis not present

## 2018-08-06 DIAGNOSIS — Z48812 Encounter for surgical aftercare following surgery on the circulatory system: Secondary | ICD-10-CM | POA: Diagnosis not present

## 2018-08-06 DIAGNOSIS — I110 Hypertensive heart disease with heart failure: Secondary | ICD-10-CM | POA: Diagnosis not present

## 2018-08-06 DIAGNOSIS — I5032 Chronic diastolic (congestive) heart failure: Secondary | ICD-10-CM | POA: Diagnosis not present

## 2018-08-08 ENCOUNTER — Telehealth (INDEPENDENT_AMBULATORY_CARE_PROVIDER_SITE_OTHER): Payer: Medicare PPO | Admitting: Physician Assistant

## 2018-08-08 ENCOUNTER — Other Ambulatory Visit: Payer: Self-pay | Admitting: Physician Assistant

## 2018-08-08 ENCOUNTER — Ambulatory Visit (HOSPITAL_COMMUNITY): Payer: Medicare PPO

## 2018-08-08 ENCOUNTER — Other Ambulatory Visit: Payer: Self-pay

## 2018-08-08 DIAGNOSIS — G629 Polyneuropathy, unspecified: Secondary | ICD-10-CM | POA: Diagnosis not present

## 2018-08-08 DIAGNOSIS — N2889 Other specified disorders of kidney and ureter: Secondary | ICD-10-CM

## 2018-08-08 DIAGNOSIS — I2510 Atherosclerotic heart disease of native coronary artery without angina pectoris: Secondary | ICD-10-CM | POA: Diagnosis not present

## 2018-08-08 DIAGNOSIS — Z952 Presence of prosthetic heart valve: Secondary | ICD-10-CM

## 2018-08-08 DIAGNOSIS — Z48812 Encounter for surgical aftercare following surgery on the circulatory system: Secondary | ICD-10-CM | POA: Diagnosis not present

## 2018-08-08 DIAGNOSIS — I5032 Chronic diastolic (congestive) heart failure: Secondary | ICD-10-CM | POA: Diagnosis not present

## 2018-08-08 DIAGNOSIS — M170 Bilateral primary osteoarthritis of knee: Secondary | ICD-10-CM | POA: Diagnosis not present

## 2018-08-08 DIAGNOSIS — Z950 Presence of cardiac pacemaker: Secondary | ICD-10-CM

## 2018-08-08 DIAGNOSIS — R5381 Other malaise: Secondary | ICD-10-CM

## 2018-08-08 DIAGNOSIS — I110 Hypertensive heart disease with heart failure: Secondary | ICD-10-CM | POA: Diagnosis not present

## 2018-08-08 DIAGNOSIS — M545 Low back pain: Secondary | ICD-10-CM | POA: Diagnosis not present

## 2018-08-08 NOTE — Progress Notes (Signed)
[facility]   MULTIDISCIPLINARY HEART VALVE TEAM   Evaluation Performed:  Follow-up visit  This visit type was conducted due to national recommendations for restrictions regarding the COVID-19 Pandemic (e.g. social distancing).  This format is felt to be most appropriate for this patient at this time.  All issues noted in this document were discussed and addressed.  No physical exam was performed (except for noted visual exam findings with Telehealth visits).  The patient has consented to conduct a Telehealth visit and understands insurance will be billed. See phone note on 08/05/18 for full consent.   Date:  08/08/2018   ID:  Dawn Gonzales, DOB March 28, 1941, MRN [id number]  Patient Location:  [address] [place] [id number]   Provider location:   [address] [place], [place] [address]  PCP:  Benita StabileHall, John Z, MD  Cardiologist:  Verne Carrowhristopher McAlhany, MD  Electrophysiologist:  None   Chief Complaint:  1 month s/p TAVR  History of Present Illness:    Dawn Gonzales is a 78 y.o. 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    The patient does not symptoms concerning for COVID-19 infection (fever, chills, cough, or new SHORTNESS OF BREATH).    Patient states that she has been told that she had a heart murmur for many years. Approximately 1 year ago she was hospitalized for mitral valve endocarditis that occurred in the setting of cellulitis. She was diagnosed with severe symptomatic aortic stenosis at that time. She was evaluated in [place] where she underwent diagnostic cardiac catheterization that revealed minimal nonobstructive coronary artery  disease, mildly elevated right heart pressures, severe aortic stenosis. She was noted to have poor dentition and dental extraction was recommended. At that time she still had a biliary drain in place having prior to that presented with acute cholecystitis complicated by choledocho cholelithiasis. Several months later [place] moved to [place] where she now lives in [place] close to her daughter. She [facility] with recurrent acute cholecystitis complicated by choledocholithiasis and sepsis. Transthoracic echocardiogram performed at that time revealed severe aortic stenosis with normal left ventricular systolic function. She was referred to general surgerybut ultimately underwent ERCP with successful stone extraction and biliary sphincterotomy. Since then she has been seen in follow-up by [facility] Less[facility]field [facility] to consider elective cholecystectomy. She was felt to be a poor candidate andvery high risk because of presence of severe aortic stenosis. She was subsequently referred to the multidisciplinary heart valve clinic and has been evaluated previously by Dr. Clifton JamesMcAlhany. Dental consultation was requested and she underwent dental extraction.  She underwent successful TAVR with a3526mm Edwards Sapien 3 THV via the TF approach on 07/09/18. Post operative echo showed EF 60%, normally functioning TAVR with mean gradient of  15.6 mmHg and two jets of trivial and mild PVL. Her post op course was complicated by complete heart block and she underwent Biotronik dual-chamber pacemaker by Dr. Ladona Ridgelaylor on 07/10/18.   Today she presents for an audio/video telemedicine visit. She is doing quite well. Stilling working with PT in the home, but overall very sedentary at baseline. No CP or SOB. No LE edema, orthopnea or PND. No dizziness or syncope. No  blood in stool or urine. No palpitations.    Prior CV studies:   The following studies were reviewed today:   TAVR OPERATIVE NOTE   Date of Procedure:07/09/2018  Preoperative Diagnosis:Severe Aortic Stenosis   Postoperative Diagnosis:Same   Procedure:   Transcatheter Aortic Valve Replacement - PercutaneousRightTransfemoral Approach Edwards Sapien 3 THV (size 26mm, model # 9600TFX, serial # [id number])  Co-Surgeons:Clarence H. Cornelius Moras, MD and Tonny Bollman, MD  Anesthesiologist:William Traci Sermon, MD  Delano Metz, MD  Pre-operative Echo Findings: ? Severe aortic stenosis ? Normalleft ventricular systolic function  Post-operative Echo Findings: ? Mildparavalvular leak ? Normalleft ventricular systolic function   __________________   Echo 07/10/18: IMPRESSIONS 1. A 26 an Edwards Edwards Sapien bioprosthetic aortic valve (TAVR) valve is present in the aortic position. Procedure Date: 07/09/2018 Normal aortic valve prosthesis. Echo findings are consistent with perivalvular leak of the aortic prosthesis. 2. Aortic valve regurgitation is mild by color flow Doppler. 3. Two jets of perivalvular leak, one trivial and one mild, in the area of the prior right and left coronary cusps. 4. The left ventricle has normal systolic function, with an ejection fraction of 60-65%. The cavity size was normal. There is severe concentric left ventricular hypertrophy. Left ventricular diastolic Doppler parameters are consistent with pseudonormal. 5. The right ventricle has normal systolc function. The cavity was normal. There is no increase in right ventricular wall thickness. 6. Left atrial size was moderately dilated. 7. Trivial pericardial effusion is present. 8. The pericardial effusion is posterior to the left ventricle. 9. Mild thickening of the mitral valve leaflet. Mild calcification of the mitral valve leaflet, with mildly decreased mobility. There is  severe mitral annular calcification present. Mild mitral valve stenosis. 10. The pulmonic valve was grossly normal. Pulmonic valve regurgitation is mild by color flow Doppler. 11. The aortic root and ascending aorta are normal in size and structure.  __________________   07/10/18 PACEMAKER IMPLANT  Conclusion CONCLUSIONS:  1. Successful implantation of a Biotronik dual-chamber pacemaker for symptomatic bradycardia due to complete heart block 2. No early apparent complications.      Past Medical History:  Diagnosis Date  . Anemia   . Anxiety   . Arthritis   . Cholecystitis   . Chronic diastolic CHF (congestive heart failure), NYHA class 2 (HCC)   . Complication of anesthesia    very slow to wake up  . Depression   . History of cellulitis   . History of CVA (cerebrovascular accident)   . History of endocarditis   . Hyperlipemia   . Hypertension   . Kidney lesion, native, right    on pre TAVR CT scan. will require follow up wtih a MRI  . Morbid obesity (HCC)   . Neuropathy   . Physical deconditioning   . Right bundle branch block (RBBB)   . 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 Severe aortic stenosis   . Sleep apnea   . Tobacco abuse    Past Surgical History:  Procedure Laterality Date  . Abdominal gangrene    . ADENOIDECTOMY    . Cataract surgery    . COLONOSCOPY    . ENDOSCOPIC RETROGRADE CHOLANGIOPANCREATOGRAPHY (ERCP) WITH PROPOFOL N/A 02/08/2018   Procedure: ENDOSCOPIC RETROGRADE CHOLANGIOPANCREATOGRAPHY (ERCP) WITH PROPOFOL;  Surgeon: Meridee Score Netty Starring., MD;  [facility]COPY;  Service: Gastroenterology;  Laterality: N/A;  . EUS  02/08/2018   Procedure: UPPER  ENDOSCOPIC ULTRASOUND (EUS) LINEAR;  Surgeon: Meridee Score Netty Starring., MD;  Location: [facility] ENDOSCOPY;  Service: Gastroenterology;;  . EYE SURGERY    . IR FLUORO RM  30-60 MIN  03/27/2018  . IR PERC CHOLECYSTOSTOMY  02/09/2018  . IR RADIOLOGIST EVAL & MGMT  03/26/2018  . IR RADIOLOGY PERIPHERAL GUIDED IV START  05/22/2018  . IR RADIOLOGY PERIPHERAL GUIDED IV START  05/30/2018  . IR RADIOLOGY PERIPHERAL GUIDED IV START  06/05/2018  . IR US GUIDE VASC ACCESS RIGHT  05/22/2018  . IR US GUIDE VASC ACCESS RIGHT  05/30/2018  . IR US GUIDE VASC ACCESS RIGHT  06/05/2018  . PACEMAKER IMPLANT N/A 07/10/2018   Procedure: PACEMAKER IMPLANT;  Surgeon: Marinus Maw, MD;  Location: [facility] INVASIVE CV LAB;  Service: Cardiovascular;  Laterality: N/A;  . REMOVAL OF STONES  02/08/2018   Procedure: REMOVAL OF STONES;  Surgeon: Lemar Lofty., MD;  Location: [facility] ENDOSCOPY;  Service: Gastroenterology;;  . Dennison Mascot  02/08/2018   Procedure: Dennison Mascot;  Surgeon: Mansouraty, Netty Starring., MD;  Location: [facility] ENDOSCOPY;  Service: Gastroenterology;;  . TONSILLECTOMY    . TOOTH EXTRACTION N/A 06/18/2018   Procedure: DENTAL RESTORATION/EXTRACTIONS;  Surgeon: Ocie Doyne, DDS;  [facility];  Service: Oral Surgery;  Laterality: N/A;  . TRANSCATHETER AORTIC VALVE REPLACEMENT, TRANSFEMORAL N/A 07/09/2018   Procedure: TRANSCATHETER AORTIC VALVE REPLACEMENT, TRANSFEMORAL;  Surgeon: Tonny Bollman, MD;  Location: [facility] INVASIVE CV LAB;  Service: Cardiovascular;  Laterality: N/A;  . Tummy tuck       Current Meds  Medication Sig  . acetaminophen (TYLENOL) 650 MG CR tablet Take 1,300 mg by mouth every 8 (eight) hours as needed for pain.  Marland Kitchen aspirin EC 81 MG tablet Take 81 mg by mouth daily.  . Calcium Carbonate (CALCIUM-CARB 600 PO) Take 600 mg by mouth daily.  . cholecalciferol (VITAMIN D) 1000 units tablet Take 1,000 Units by mouth daily.  . clopidogrel (PLAVIX) 75 MG tablet Take 1 tablet (75 mg total) by mouth daily.  . ferrous sulfate 325 (65 FE) MG tablet Take 325 mg by mouth daily with breakfast.  . gabapentin (NEURONTIN) 300 MG capsule Take 300 mg by mouth 3 (three) times  daily.  . Multiple Vitamin (MULTIVITAMIN WITH MINERALS) TABS tablet Take 1 tablet by mouth daily.  . niacin 500 MG tablet Take 500 mg by mouth daily.  Marland Kitchen nystatin ointment (MYCOSTATIN) Apply 1 application topically 2 (two) times daily as needed (rash).   . pravastatin (PRAVACHOL) 20 MG tablet Take 20 mg by mouth at bedtime.   . traMADol (ULTRAM) 50 MG tablet Take 50 mg by mouth every 6 (six) hours as needed for moderate pain.  Marland Kitchen venlafaxine (EFFEXOR) 37.5 MG tablet Take 37.5 mg by mouth daily.  . vitamin C (ASCORBIC ACID) 500 MG tablet Take 500 mg by mouth daily.  Marland Kitchen ZINC OXIDE EX Apply 1 application topically daily as needed (wound care).     Allergies:   Patient has no known allergies.   Social History   Tobacco Use  . Smoking status: Former Smoker    Types: Cigarettes    Last attempt to quit: 05/23/1978    Years since quitting: 40.2  . Smokeless tobacco: Never Used  . Tobacco comment: only 1 pack pewr week   Substance Use Topics  . Alcohol use: Yes    Comment: Occasional  . Drug use: Never     Family Hx: The patient's family history includes Arthritis in her father; Cancer in her mother;  Heart attack in her father; Hypertension in her father.  ROS:   Please see the history of present illness.    All other systems reviewed and are negative.   Labs/Other Tests and Data Reviewed:    Recent Labs: 07/05/2018: ALT 13; B Natriuretic Peptide 174.4 07/10/2018: Magnesium 1.8 07/11/2018: BUN 19; Creatinine, Ser 0.78; Hemoglobin 10.6; Platelets 161; Potassium 4.2; Sodium 138   Recent Lipid Panel No results found for: CHOL, TRIG, HDL, CHOLHDL, LDLCALC, LDLDIRECT  Wt Readings from Last 3 Encounters:  07/10/18 278 lb 10.6 oz (126.4 kg)  07/05/18 269 lb 6.4 oz (122.2 kg)  07/01/18 269 lb (122 kg)     Exam:    There were no vitals filed for this visit.  Patient appears well over the phone. Morbidly obese, chronically ill appearing white female. Legs with chronic venous stasis and  mild pitting edema  ASSESSMENT & PLAN:    Severe AS s/p TAVR: doing well since TAVR with NYHA class II symptoms; she is not very physically active at baseline. KCCQ completed over the phone. Continue plavix and aspirin. Plavix can be discontinued after 6 months of therapy (~12/2018). SBE prophylaxis discussed; the patient is edentulous and does not go to the dentist. 1 month echo will be pushed out give covid 19 pandemic. I will see her back in 1 year for follow up and echo.   CHB s/p PPM: follow up with Dr. Ladona Ridgel in June   Physical deconditioning: HH PT coming out to the home.  Renal lesion: a "1.9 cm lesion in the periphery of the lower pole the right kidney which may have some enhancing mural nodularity" was noted on pre TAVR CT as an incidental finding. 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 I spoke to Dr. Ova Freshwater in radiology today about the urgency of follow up on this lesion in light of the Covid-19 pandemic. He felt that it was stable to wait a 2-3 months for MRI. I tried to have this arranged but they cannot schedule at this time since she has a pacemaker and they will need an Electronics engineer present. They will call her to arrange this.   COVID-19 Education: The signs and symptoms of COVID-19 were discussed with the patient and how to seek care for testing (follow up with PCP or arrange E-visit).  The importance of social distancing was discussed today.  Patient Risk:   After full review of this patients clinical status, I feel that they are at least moderate risk at this time.  Time:   Today, I have spent 25 minutes with the patient with telehealth technology discussing post surgical recovery, symptoms and instructions going forward.     Medication Adjustments/Labs and Tests Ordered: Current  medicines are reviewed at length with the patient today.  Concerns regarding medicines are outlined above.  Tests Ordered: Orders Placed This Encounter  Procedures  . MR ABDOMEN WWO CONTRAST   Medication Changes: No orders of the defined types were placed in this encounter.   Disposition:  Echo and apt with Dr. Clifton James. MRI to be scheduled in June  Signed, Kathryn Thompson, Cordelia Poche  08/08/2018 12:58 PM    [facility] [address] [place], [place], [place]  [address] Phone: [phone]; Fax: [phone]

## 2018-08-08 NOTE — Patient Instructions (Addendum)
Hello Ms. Kagawa,   It was so nice to talk to you and Huntley Dec on the phone today. I am so glad you are doing so well. I just wanted to send you a recap of our discussion.   Please continue all your same medications. You can discontinue plavix after 6 months of therapy (around 01/09/19) or when your pills run out (you were given a 90 day supply with one refill). Please continue on aspirin 81 mg indefinitely.   Your 1 month echo has been rescheduled to August. You will need a MRI of you abdomen to follow up on a spot found on your kidney. This will be scheduled in June. I am unable to schedule this at this time. The radiology department will call you to arrange an appointment closer to the time.   You will be seen back by your primary cardiologist, Dr. Clifton James, in 3-4 months.   I will see you back in 1 years time with an echo.   All appointment details are attached to this letter in your "after visit summary."  Please call us with any questions or concerns you may have and please stay safe during these uncertain times.  Carlean Jews

## 2018-08-14 DIAGNOSIS — Z48812 Encounter for surgical aftercare following surgery on the circulatory system: Secondary | ICD-10-CM | POA: Diagnosis not present

## 2018-08-14 DIAGNOSIS — G629 Polyneuropathy, unspecified: Secondary | ICD-10-CM | POA: Diagnosis not present

## 2018-08-14 DIAGNOSIS — I110 Hypertensive heart disease with heart failure: Secondary | ICD-10-CM | POA: Diagnosis not present

## 2018-08-14 DIAGNOSIS — I2510 Atherosclerotic heart disease of native coronary artery without angina pectoris: Secondary | ICD-10-CM | POA: Diagnosis not present

## 2018-08-14 DIAGNOSIS — M170 Bilateral primary osteoarthritis of knee: Secondary | ICD-10-CM | POA: Diagnosis not present

## 2018-08-14 DIAGNOSIS — Z950 Presence of cardiac pacemaker: Secondary | ICD-10-CM | POA: Diagnosis not present

## 2018-08-14 DIAGNOSIS — I5032 Chronic diastolic (congestive) heart failure: Secondary | ICD-10-CM | POA: Diagnosis not present

## 2018-08-14 DIAGNOSIS — Z952 Presence of prosthetic heart valve: Secondary | ICD-10-CM | POA: Diagnosis not present

## 2018-08-14 DIAGNOSIS — M545 Low back pain: Secondary | ICD-10-CM | POA: Diagnosis not present

## 2018-08-16 DIAGNOSIS — Z950 Presence of cardiac pacemaker: Secondary | ICD-10-CM | POA: Diagnosis not present

## 2018-08-16 DIAGNOSIS — Z48812 Encounter for surgical aftercare following surgery on the circulatory system: Secondary | ICD-10-CM | POA: Diagnosis not present

## 2018-08-16 DIAGNOSIS — Z952 Presence of prosthetic heart valve: Secondary | ICD-10-CM | POA: Diagnosis not present

## 2018-08-16 DIAGNOSIS — M170 Bilateral primary osteoarthritis of knee: Secondary | ICD-10-CM | POA: Diagnosis not present

## 2018-08-16 DIAGNOSIS — I2510 Atherosclerotic heart disease of native coronary artery without angina pectoris: Secondary | ICD-10-CM | POA: Diagnosis not present

## 2018-08-16 DIAGNOSIS — M545 Low back pain: Secondary | ICD-10-CM | POA: Diagnosis not present

## 2018-08-16 DIAGNOSIS — G629 Polyneuropathy, unspecified: Secondary | ICD-10-CM | POA: Diagnosis not present

## 2018-08-16 DIAGNOSIS — I110 Hypertensive heart disease with heart failure: Secondary | ICD-10-CM | POA: Diagnosis not present

## 2018-08-16 DIAGNOSIS — I5032 Chronic diastolic (congestive) heart failure: Secondary | ICD-10-CM | POA: Diagnosis not present

## 2018-08-20 DIAGNOSIS — I5032 Chronic diastolic (congestive) heart failure: Secondary | ICD-10-CM | POA: Diagnosis not present

## 2018-08-20 DIAGNOSIS — M170 Bilateral primary osteoarthritis of knee: Secondary | ICD-10-CM | POA: Diagnosis not present

## 2018-08-20 DIAGNOSIS — G629 Polyneuropathy, unspecified: Secondary | ICD-10-CM | POA: Diagnosis not present

## 2018-08-20 DIAGNOSIS — Z952 Presence of prosthetic heart valve: Secondary | ICD-10-CM | POA: Diagnosis not present

## 2018-08-20 DIAGNOSIS — I110 Hypertensive heart disease with heart failure: Secondary | ICD-10-CM | POA: Diagnosis not present

## 2018-08-20 DIAGNOSIS — I2510 Atherosclerotic heart disease of native coronary artery without angina pectoris: Secondary | ICD-10-CM | POA: Diagnosis not present

## 2018-08-20 DIAGNOSIS — Z48812 Encounter for surgical aftercare following surgery on the circulatory system: Secondary | ICD-10-CM | POA: Diagnosis not present

## 2018-08-20 DIAGNOSIS — M545 Low back pain: Secondary | ICD-10-CM | POA: Diagnosis not present

## 2018-08-20 DIAGNOSIS — Z950 Presence of cardiac pacemaker: Secondary | ICD-10-CM | POA: Diagnosis not present

## 2018-08-23 DIAGNOSIS — I110 Hypertensive heart disease with heart failure: Secondary | ICD-10-CM | POA: Diagnosis not present

## 2018-08-23 DIAGNOSIS — M545 Low back pain: Secondary | ICD-10-CM | POA: Diagnosis not present

## 2018-08-23 DIAGNOSIS — Z950 Presence of cardiac pacemaker: Secondary | ICD-10-CM | POA: Diagnosis not present

## 2018-08-23 DIAGNOSIS — M170 Bilateral primary osteoarthritis of knee: Secondary | ICD-10-CM | POA: Diagnosis not present

## 2018-08-23 DIAGNOSIS — Z48812 Encounter for surgical aftercare following surgery on the circulatory system: Secondary | ICD-10-CM | POA: Diagnosis not present

## 2018-08-23 DIAGNOSIS — I5032 Chronic diastolic (congestive) heart failure: Secondary | ICD-10-CM | POA: Diagnosis not present

## 2018-08-23 DIAGNOSIS — G629 Polyneuropathy, unspecified: Secondary | ICD-10-CM | POA: Diagnosis not present

## 2018-08-23 DIAGNOSIS — I2510 Atherosclerotic heart disease of native coronary artery without angina pectoris: Secondary | ICD-10-CM | POA: Diagnosis not present

## 2018-08-23 DIAGNOSIS — Z952 Presence of prosthetic heart valve: Secondary | ICD-10-CM | POA: Diagnosis not present

## 2018-08-27 ENCOUNTER — Encounter: Payer: Self-pay | Admitting: Physician Assistant

## 2018-08-27 DIAGNOSIS — I5032 Chronic diastolic (congestive) heart failure: Secondary | ICD-10-CM | POA: Diagnosis not present

## 2018-08-27 DIAGNOSIS — Z48812 Encounter for surgical aftercare following surgery on the circulatory system: Secondary | ICD-10-CM | POA: Diagnosis not present

## 2018-08-27 DIAGNOSIS — Z950 Presence of cardiac pacemaker: Secondary | ICD-10-CM | POA: Diagnosis not present

## 2018-08-27 DIAGNOSIS — Z952 Presence of prosthetic heart valve: Secondary | ICD-10-CM | POA: Diagnosis not present

## 2018-08-27 DIAGNOSIS — I2510 Atherosclerotic heart disease of native coronary artery without angina pectoris: Secondary | ICD-10-CM | POA: Diagnosis not present

## 2018-08-27 DIAGNOSIS — M170 Bilateral primary osteoarthritis of knee: Secondary | ICD-10-CM | POA: Diagnosis not present

## 2018-08-27 DIAGNOSIS — G629 Polyneuropathy, unspecified: Secondary | ICD-10-CM | POA: Diagnosis not present

## 2018-08-27 DIAGNOSIS — M545 Low back pain: Secondary | ICD-10-CM | POA: Diagnosis not present

## 2018-08-27 DIAGNOSIS — I110 Hypertensive heart disease with heart failure: Secondary | ICD-10-CM | POA: Diagnosis not present

## 2018-08-30 DIAGNOSIS — Z48812 Encounter for surgical aftercare following surgery on the circulatory system: Secondary | ICD-10-CM | POA: Diagnosis not present

## 2018-08-30 DIAGNOSIS — G629 Polyneuropathy, unspecified: Secondary | ICD-10-CM | POA: Diagnosis not present

## 2018-08-30 DIAGNOSIS — Z952 Presence of prosthetic heart valve: Secondary | ICD-10-CM | POA: Diagnosis not present

## 2018-08-30 DIAGNOSIS — I5032 Chronic diastolic (congestive) heart failure: Secondary | ICD-10-CM | POA: Diagnosis not present

## 2018-08-30 DIAGNOSIS — M170 Bilateral primary osteoarthritis of knee: Secondary | ICD-10-CM | POA: Diagnosis not present

## 2018-08-30 DIAGNOSIS — Z950 Presence of cardiac pacemaker: Secondary | ICD-10-CM | POA: Diagnosis not present

## 2018-08-30 DIAGNOSIS — I110 Hypertensive heart disease with heart failure: Secondary | ICD-10-CM | POA: Diagnosis not present

## 2018-08-30 DIAGNOSIS — Z0000 Encounter for general adult medical examination without abnormal findings: Secondary | ICD-10-CM | POA: Diagnosis not present

## 2018-08-30 DIAGNOSIS — M545 Low back pain: Secondary | ICD-10-CM | POA: Diagnosis not present

## 2018-08-30 DIAGNOSIS — I2510 Atherosclerotic heart disease of native coronary artery without angina pectoris: Secondary | ICD-10-CM | POA: Diagnosis not present

## 2018-09-02 DIAGNOSIS — E661 Drug-induced obesity: Secondary | ICD-10-CM | POA: Diagnosis not present

## 2018-09-02 DIAGNOSIS — M6281 Muscle weakness (generalized): Secondary | ICD-10-CM | POA: Diagnosis not present

## 2018-09-03 DIAGNOSIS — G629 Polyneuropathy, unspecified: Secondary | ICD-10-CM | POA: Diagnosis not present

## 2018-09-03 DIAGNOSIS — M170 Bilateral primary osteoarthritis of knee: Secondary | ICD-10-CM | POA: Diagnosis not present

## 2018-09-03 DIAGNOSIS — Z48812 Encounter for surgical aftercare following surgery on the circulatory system: Secondary | ICD-10-CM | POA: Diagnosis not present

## 2018-09-03 DIAGNOSIS — Z950 Presence of cardiac pacemaker: Secondary | ICD-10-CM | POA: Diagnosis not present

## 2018-09-03 DIAGNOSIS — I110 Hypertensive heart disease with heart failure: Secondary | ICD-10-CM | POA: Diagnosis not present

## 2018-09-03 DIAGNOSIS — M545 Low back pain: Secondary | ICD-10-CM | POA: Diagnosis not present

## 2018-09-03 DIAGNOSIS — Z952 Presence of prosthetic heart valve: Secondary | ICD-10-CM | POA: Diagnosis not present

## 2018-09-03 DIAGNOSIS — I5032 Chronic diastolic (congestive) heart failure: Secondary | ICD-10-CM | POA: Diagnosis not present

## 2018-09-03 DIAGNOSIS — I2510 Atherosclerotic heart disease of native coronary artery without angina pectoris: Secondary | ICD-10-CM | POA: Diagnosis not present

## 2018-09-05 DIAGNOSIS — I2510 Atherosclerotic heart disease of native coronary artery without angina pectoris: Secondary | ICD-10-CM | POA: Diagnosis not present

## 2018-09-05 DIAGNOSIS — G629 Polyneuropathy, unspecified: Secondary | ICD-10-CM | POA: Diagnosis not present

## 2018-09-05 DIAGNOSIS — Z48812 Encounter for surgical aftercare following surgery on the circulatory system: Secondary | ICD-10-CM | POA: Diagnosis not present

## 2018-09-05 DIAGNOSIS — I5032 Chronic diastolic (congestive) heart failure: Secondary | ICD-10-CM | POA: Diagnosis not present

## 2018-09-05 DIAGNOSIS — I110 Hypertensive heart disease with heart failure: Secondary | ICD-10-CM | POA: Diagnosis not present

## 2018-09-05 DIAGNOSIS — M545 Low back pain: Secondary | ICD-10-CM | POA: Diagnosis not present

## 2018-09-05 DIAGNOSIS — Z952 Presence of prosthetic heart valve: Secondary | ICD-10-CM | POA: Diagnosis not present

## 2018-09-05 DIAGNOSIS — M170 Bilateral primary osteoarthritis of knee: Secondary | ICD-10-CM | POA: Diagnosis not present

## 2018-09-05 DIAGNOSIS — Z950 Presence of cardiac pacemaker: Secondary | ICD-10-CM | POA: Diagnosis not present

## 2018-09-11 DIAGNOSIS — I110 Hypertensive heart disease with heart failure: Secondary | ICD-10-CM | POA: Diagnosis not present

## 2018-09-11 DIAGNOSIS — Z48812 Encounter for surgical aftercare following surgery on the circulatory system: Secondary | ICD-10-CM | POA: Diagnosis not present

## 2018-09-11 DIAGNOSIS — I2510 Atherosclerotic heart disease of native coronary artery without angina pectoris: Secondary | ICD-10-CM | POA: Diagnosis not present

## 2018-09-11 DIAGNOSIS — Z952 Presence of prosthetic heart valve: Secondary | ICD-10-CM | POA: Diagnosis not present

## 2018-09-11 DIAGNOSIS — M545 Low back pain: Secondary | ICD-10-CM | POA: Diagnosis not present

## 2018-09-11 DIAGNOSIS — G629 Polyneuropathy, unspecified: Secondary | ICD-10-CM | POA: Diagnosis not present

## 2018-09-11 DIAGNOSIS — Z950 Presence of cardiac pacemaker: Secondary | ICD-10-CM | POA: Diagnosis not present

## 2018-09-11 DIAGNOSIS — M170 Bilateral primary osteoarthritis of knee: Secondary | ICD-10-CM | POA: Diagnosis not present

## 2018-09-11 DIAGNOSIS — I5032 Chronic diastolic (congestive) heart failure: Secondary | ICD-10-CM | POA: Diagnosis not present

## 2018-09-13 DIAGNOSIS — G629 Polyneuropathy, unspecified: Secondary | ICD-10-CM | POA: Diagnosis not present

## 2018-09-13 DIAGNOSIS — Z952 Presence of prosthetic heart valve: Secondary | ICD-10-CM | POA: Diagnosis not present

## 2018-09-13 DIAGNOSIS — I2510 Atherosclerotic heart disease of native coronary artery without angina pectoris: Secondary | ICD-10-CM | POA: Diagnosis not present

## 2018-09-13 DIAGNOSIS — M170 Bilateral primary osteoarthritis of knee: Secondary | ICD-10-CM | POA: Diagnosis not present

## 2018-09-13 DIAGNOSIS — M545 Low back pain: Secondary | ICD-10-CM | POA: Diagnosis not present

## 2018-09-13 DIAGNOSIS — Z48812 Encounter for surgical aftercare following surgery on the circulatory system: Secondary | ICD-10-CM | POA: Diagnosis not present

## 2018-09-13 DIAGNOSIS — I5032 Chronic diastolic (congestive) heart failure: Secondary | ICD-10-CM | POA: Diagnosis not present

## 2018-09-13 DIAGNOSIS — Z950 Presence of cardiac pacemaker: Secondary | ICD-10-CM | POA: Diagnosis not present

## 2018-09-13 DIAGNOSIS — I110 Hypertensive heart disease with heart failure: Secondary | ICD-10-CM | POA: Diagnosis not present

## 2018-09-18 ENCOUNTER — Ambulatory Visit (HOSPITAL_COMMUNITY)
Admission: RE | Admit: 2018-09-18 | Discharge: 2018-09-18 | Disposition: A | Discharge status: Home / Self Care | Payer: Medicare PPO | Source: Ambulatory Visit | Attending: Physician Assistant | Admitting: Physician Assistant

## 2018-09-18 ENCOUNTER — Other Ambulatory Visit: Payer: Self-pay

## 2018-09-18 DIAGNOSIS — N289 Disorder of kidney and ureter, unspecified: Secondary | ICD-10-CM | POA: Diagnosis not present

## 2018-09-18 DIAGNOSIS — N2889 Other specified disorders of kidney and ureter: Secondary | ICD-10-CM | POA: Diagnosis not present

## 2018-09-18 DIAGNOSIS — K8020 Calculus of gallbladder without cholecystitis without obstruction: Secondary | ICD-10-CM | POA: Diagnosis not present

## 2018-09-18 LAB — POCT I-STAT CREATININE: Creatinine, Ser: 0.7 mg/dL (ref 0.44–1.00)

## 2018-09-18 MED ORDER — GADOBUTROL 1 MMOL/ML IV SOLN
10.0000 mL | Freq: Once | INTRAVENOUS | Status: AC | PRN
  Administered 2018-09-18: 10 mL via INTRAVENOUS

## 2018-10-03 DIAGNOSIS — M6281 Muscle weakness (generalized): Secondary | ICD-10-CM | POA: Diagnosis not present

## 2018-10-03 DIAGNOSIS — E661 Drug-induced obesity: Secondary | ICD-10-CM | POA: Diagnosis not present

## 2018-10-14 ENCOUNTER — Other Ambulatory Visit: Payer: Self-pay

## 2018-10-14 ENCOUNTER — Ambulatory Visit (INDEPENDENT_AMBULATORY_CARE_PROVIDER_SITE_OTHER): Payer: Medicare PPO | Admitting: Student

## 2018-10-14 ENCOUNTER — Encounter: Payer: Self-pay | Admitting: Nurse Practitioner

## 2018-10-14 VITALS — BP 132/68 | HR 64 | Ht 66.0 in | Wt 273.6 lb

## 2018-10-14 DIAGNOSIS — I442 Atrioventricular block, complete: Secondary | ICD-10-CM

## 2018-10-14 DIAGNOSIS — Z950 Presence of cardiac pacemaker: Secondary | ICD-10-CM

## 2018-10-14 LAB — CUP PACEART INCLINIC DEVICE CHECK
Date Time Interrogation Session: 20200615103030
Implantable Lead Implant Date: 20200311
Implantable Lead Implant Date: 20200311
Implantable Lead Location: 753859
Implantable Lead Location: 753860
Implantable Lead Model: 377
Implantable Lead Model: 377
Implantable Lead Serial Number: 81024123
Implantable Lead Serial Number: 81075341
Implantable Pulse Generator Implant Date: 20200311
Lead Channel Impedance Value: 448 Ohm
Lead Channel Impedance Value: 585 Ohm
Lead Channel Pacing Threshold Amplitude: 0.6 V
Lead Channel Pacing Threshold Amplitude: 0.7 V
Lead Channel Pacing Threshold Amplitude: 0.7 V
Lead Channel Pacing Threshold Amplitude: 0.8 V
Lead Channel Pacing Threshold Amplitude: 0.8 V
Lead Channel Pacing Threshold Amplitude: 0.8 V
Lead Channel Pacing Threshold Pulse Width: 0.4 ms
Lead Channel Pacing Threshold Pulse Width: 0.4 ms
Lead Channel Pacing Threshold Pulse Width: 0.4 ms
Lead Channel Pacing Threshold Pulse Width: 0.4 ms
Lead Channel Pacing Threshold Pulse Width: 0.4 ms
Lead Channel Pacing Threshold Pulse Width: 0.4 ms
Lead Channel Sensing Intrinsic Amplitude: 11.7 mV
Lead Channel Sensing Intrinsic Amplitude: 12 mV
Lead Channel Sensing Intrinsic Amplitude: 4.7 mV
Lead Channel Sensing Intrinsic Amplitude: 4.9 mV
Lead Channel Setting Pacing Amplitude: 1.6 V
Lead Channel Setting Pacing Amplitude: 1.8 V
Lead Channel Setting Pacing Pulse Width: 0.4 ms
Pulse Gen Model: 407145
Pulse Gen Serial Number: 69550148

## 2018-10-14 NOTE — Progress Notes (Signed)
Electrophysiology Office Note Date: 10/14/2018  ID:  Dawn Gonzales, DOB [birth year], MRN [id number]  PCP: Benita StabileHall, John Z, MD Primary Cardiologist: Verne Carrowhristopher McAlhany, MD Electrophysiologist: Lewayne BuntingGregg Taylor, MD  CC: Pacemaker follow-up  Dawn Gonzales is a 78 y.o. female seen today for Dr. Ladona Ridgelaylor. They present today for routine electrophysiology followup.  Since last being seen in our clinic, the patient reports doing very well.  They deny chest pain, palpitations, dyspnea, PND, orthopnea, nausea, vomiting, dizziness, syncope, edema, weight gain, or early satiety.  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   Past Medical History:  Diagnosis Date  . Anemia   . Anxiety   . Arthritis   . Cholecystitis   . Chronic diastolic CHF (congestive heart failure), NYHA class 2 (HCC)   . Complication of anesthesia    very slow to wake up  . Depression   . History of cellulitis   . History of CVA (cerebrovascular accident)   . History of endocarditis   . Hyperlipemia   . Hypertension   . Kidney lesion, native, right    on pre TAVR CT scan. will require follow up wtih a MRI  . Morbid obesity (HCC)   . Neuropathy   . Physical deconditioning   . Right bundle branch block (RBBB)   . 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 Severe aortic stenosis   . Sleep apnea   . Tobacco abuse    Past Surgical History:  Procedure Laterality Date  . Abdominal gangrene    . ADENOIDECTOMY    . Cataract surgery    . COLONOSCOPY    . ENDOSCOPIC RETROGRADE CHOLANGIOPANCREATOGRAPHY (ERCP) WITH PROPOFOL N/A 02/08/2018   Procedure: ENDOSCOPIC RETROGRADE CHOLANGIOPANCREATOGRAPHY (ERCP) WITH PROPOFOL;  Surgeon: Meridee ScoreMansouraty, Netty StarringGabriel Jr., MD;  [facility];  Service: Gastroenterology;  Laterality: N/A;  . EUS  02/08/2018   Procedure: UPPER ENDOSCOPIC ULTRASOUND (EUS) LINEAR;  Surgeon: Lemar LoftyMansouraty, Gabriel Jr., MD;  [facility];  Service: Gastroenterology;;  . EYE SURGERY    . IR FLUORO RM 30-60 MIN  03/27/2018  . IR PERC CHOLECYSTOSTOMY  02/09/2018  . IR RADIOLOGIST EVAL & MGMT  03/26/2018  . IR RADIOLOGY PERIPHERAL GUIDED IV START  05/22/2018  . IR RADIOLOGY PERIPHERAL GUIDED IV START  05/30/2018  . IR RADIOLOGY PERIPHERAL GUIDED IV START  06/05/2018  . IR US GUIDE VASC ACCESS RIGHT  05/22/2018  . IR US GUIDE VASC ACCESS RIGHT  05/30/2018  . IR US GUIDE VASC ACCESS RIGHT  06/05/2018  . PACEMAKER IMPLANT N/A 07/10/2018   Procedure: PACEMAKER IMPLANT;  Surgeon: Marinus Mawaylor, Gregg W, MD;  [facility];  Service: Cardiovascular;  Laterality: N/A;  . REMOVAL OF STONES  02/08/2018   Procedure: REMOVAL OF STONES;  Surgeon: Lemar LoftyMansouraty, Gabriel Jr., MD;  [facility];  Service: Gastroenterology;;  . Dennison MascotSPHINCTEROTOMY  02/08/2018   Procedure: Dennison MascotSPHINCTEROTOMY;  Surgeon: Mansouraty, Netty StarringGabriel Jr., MD;  [facility];  Service: Gastroenterology;;  . TONSILLECTOMY    . TOOTH EXTRACTION N/A 06/18/2018   Procedure: DENTAL RESTORATION/EXTRACTIONS;  Surgeon: Ocie DoyneJensen, Scott, DDS;  [facility];  Service: Oral Surgery;  Laterality: N/A;  . TRANSCATHETER AORTIC VALVE REPLACEMENT, TRANSFEMORAL N/A 07/09/2018   Procedure: TRANSCATHETER AORTIC VALVE REPLACEMENT, TRANSFEMORAL;  Surgeon: Tonny Bollmanooper, Michael, MD;  [facility];  Service: Cardiovascular;  Laterality: N/A;  . Tummy tuck  Current Outpatient Medications  Medication Sig Dispense Refill  . acetaminophen (TYLENOL) 650 MG CR tablet Take 1,300 mg by mouth every 8 (eight) hours as needed for pain.    Marland Kitchen aspirin EC 81 MG tablet Take 81 mg by mouth daily.    . Calcium Carbonate (CALCIUM-CARB 600 PO) Take 600 mg by mouth daily.    . cholecalciferol (VITAMIN D) 1000 units tablet Take 1,000 Units by mouth daily.    . clopidogrel (PLAVIX)  75 MG tablet Take 1 tablet (75 mg total) by mouth daily. 90 tablet 1  . ferrous sulfate 325 (65 FE) MG tablet Take 325 mg by mouth daily with breakfast.    . gabapentin (NEURONTIN) 300 MG capsule Take 300 mg by mouth 3 (three) times daily.    . Multiple Vitamin (MULTIVITAMIN WITH MINERALS) TABS tablet Take 1 tablet by mouth daily.    . niacin 500 MG tablet Take 500 mg by mouth daily.    Marland Kitchen nystatin ointment (MYCOSTATIN) Apply 1 application topically 2 (two) times daily as needed (rash).   2  . pravastatin (PRAVACHOL) 20 MG tablet Take 20 mg by mouth at bedtime.     . traMADol (ULTRAM) 50 MG tablet Take 50 mg by mouth every 6 (six) hours as needed for moderate pain.    Marland Kitchen venlafaxine (EFFEXOR) 37.5 MG tablet Take 37.5 mg by mouth daily.    . vitamin C (ASCORBIC ACID) 500 MG tablet Take 500 mg by mouth daily.    Marland Kitchen ZINC OXIDE EX Apply 1 application topically daily as needed (wound care).     No current facility-administered medications for this visit.    Allergies:   Patient has no known allergies.   Social History: Social History   Socioeconomic History  . Marital status: Married    Spouse name: Not on file  . Number of children: 3  . Years of education: Not on file  . Highest education level: Not on file  Occupational History  . Occupation: Retired Cytogeneticist  . Financial resource strain: Not on file  . Food insecurity    Worry: Not on file    Inability: Not on file  . Transportation needs    Medical: Not on file    Non-medical: Not on file  Tobacco Use  . Smoking status: Former Smoker    Types: Cigarettes    Quit date: 05/23/1978    Years since quitting: 40.4  . Smokeless tobacco: Never Used  . Tobacco comment: only 1 pack pewr week   Substance and Sexual Activity  . Alcohol use: Yes    Comment: Occasional  . Drug use: Never  . Sexual activity: Not on file  Lifestyle  . Physical activity    Days per week: Not on file    Minutes per session: Not on file  .  Stress: Not on file  Relationships  . Social Herbalist on phone: Not on file    Gets together: Not on file    Attends religious service: Not on file    Active member of club or organization: Not on file    Attends meetings of clubs or organizations: Not on file    Relationship status: Not on file  . Intimate partner violence    Fear of current or ex partner: Not on file    Emotionally abused: Not on file    Physically abused: Not on file    Forced sexual activity: Not on file  Other Topics Concern  . Not on file  Social History Narrative  . Not on file    Family History: Family History  Problem Relation Age of Onset  . Cancer Mother   . Hypertension Father   . Arthritis Father   . Heart attack Father    Review of Systems: All other systems reviewed and are otherwise negative except as noted above.  Physical Exam: Vitals:   10/14/18 1009  BP: 132/68  Pulse: 64  SpO2: 96%  Weight: 273 lb 9.6 oz (124.1 kg)  Height: 5\' 6"  (1.676 m)     GEN- The patient is well appearing, alert and oriented x 3 today.   HEENT: normocephalic, atraumatic; sclera clear, conjunctiva pink; hearing intact; oropharynx clear; neck supple  Lungs- Clear to ausculation bilaterally, normal work of breathing.  No wheezes, rales, rhonchi Heart- Regular rate and rhythm, no murmurs, rubs or gallops  GI- soft, non-tender, non-distended, bowel sounds present  Extremities- no clubbing, cyanosis, or edema  MS- no significant deformity or atrophy Skin- warm and dry, no rash or lesion; PPM pocket well healed Psych- euthymic mood, full affect Neuro- strength and sensation are intact  PPM Interrogation- reviewed in detail today,  See PACEART report  EKG:  EKG is not ordered today.  Recent Labs: 07/05/2018: ALT 13; B Natriuretic Peptide 174.4 07/10/2018: Magnesium 1.8 07/11/2018: BUN 19; Hemoglobin 10.6; Platelets 161; Potassium 4.2; Sodium 138 09/18/2018: Creatinine, Ser 0.70   Wt Readings from  Last 3 Encounters:  10/14/18 273 lb 9.6 oz (124.1 kg)  07/10/18 278 lb 10.6 oz (126.4 kg)  07/05/18 269 lb 6.4 oz (122.2 kg)     Other studies Reviewed: Additional studies/ records that were reviewed today include: previous labs, PPM op note.   Assessment and Plan:  1.  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   The patient does not have concerns regarding her medicines.  The following changes were made today: None  Labs/ tests ordered today include:  Orders Placed This Encounter  Procedures  . CUP PACEART INCLINIC DEVICE CHECK    Disposition:   Follow up with Dr. Ladona Ridgelaylor annually.   Dustin FlockSigned, Michael Andrew Tillery, PA-C  10/14/2018 10:44 AM  [facility] [address] [place] [address] [phone] (office) [phone] (fax)

## 2018-10-14 NOTE — Patient Instructions (Signed)
Medication Instructions:  none If you need a refill on your cardiac medications before your next appointment, please call your pharmacy.   Lab work: none If you have labs (blood work) drawn today and your tests are completely normal, you will receive your results only by: . MyChart Message (if you have MyChart) OR . A paper copy in the mail If you have any lab test that is abnormal or we need to change your treatment, we will call you to review the results.  Testing/Procedures: none  Follow-Up: At [facility], you and your health needs are our priority.  As part of our continuing mission to provide you with exceptional heart care, we have created designated Provider Care Teams.  These Care Teams include your primary Cardiologist (physician) and Advanced Practice Providers (APPs -  Physician Assistants and Nurse Practitioners) who all work together to provide you with the care you need, when you need it. You will need a follow up appointment in 1 years.  Please call our office 2 months in advance to schedule this appointment.  You may see Dr Taylor or one of the following Advanced Practice Providers on your designated Care Team:   Amber Seiler, NP . Renee Ursuy, PA-C  Any Other Special Instructions Will Be Listed Below (If Applicable). Remote monitoring is used to monitor your Pacemaker from home. This monitoring reduces the number of office visits required to check your device to one time per year. It allows us to keep an eye on the functioning of your device to ensure it is working properly. You are scheduled for a device check from home on 9/14. You may send your transmission at any time that day. If you have a wireless device, the transmission will be sent automatically. After your physician reviews your transmission, you will receive a postcard with your next transmission date.     

## 2018-10-15 ENCOUNTER — Encounter: Payer: Self-pay | Admitting: Internal Medicine

## 2018-10-18 DIAGNOSIS — E782 Mixed hyperlipidemia: Secondary | ICD-10-CM | POA: Diagnosis not present

## 2018-10-24 ENCOUNTER — Encounter: Payer: Self-pay | Admitting: Thoracic Surgery (Cardiothoracic Vascular Surgery)

## 2018-10-29 DIAGNOSIS — F331 Major depressive disorder, recurrent, moderate: Secondary | ICD-10-CM | POA: Diagnosis not present

## 2018-10-29 DIAGNOSIS — E782 Mixed hyperlipidemia: Secondary | ICD-10-CM | POA: Diagnosis not present

## 2018-10-29 DIAGNOSIS — M170 Bilateral primary osteoarthritis of knee: Secondary | ICD-10-CM | POA: Diagnosis not present

## 2018-10-29 DIAGNOSIS — Z993 Dependence on wheelchair: Secondary | ICD-10-CM | POA: Diagnosis not present

## 2018-10-29 DIAGNOSIS — I350 Nonrheumatic aortic (valve) stenosis: Secondary | ICD-10-CM | POA: Diagnosis not present

## 2018-10-29 DIAGNOSIS — Z7409 Other reduced mobility: Secondary | ICD-10-CM | POA: Diagnosis not present

## 2018-10-29 DIAGNOSIS — M180 Bilateral primary osteoarthritis of first carpometacarpal joints: Secondary | ICD-10-CM | POA: Diagnosis not present

## 2018-10-29 DIAGNOSIS — G629 Polyneuropathy, unspecified: Secondary | ICD-10-CM | POA: Diagnosis not present

## 2018-11-02 DIAGNOSIS — M6281 Muscle weakness (generalized): Secondary | ICD-10-CM | POA: Diagnosis not present

## 2018-11-02 DIAGNOSIS — E661 Drug-induced obesity: Secondary | ICD-10-CM | POA: Diagnosis not present

## 2018-12-02 ENCOUNTER — Other Ambulatory Visit (HOSPITAL_COMMUNITY): Payer: Medicare PPO

## 2018-12-03 DIAGNOSIS — E661 Drug-induced obesity: Secondary | ICD-10-CM | POA: Diagnosis not present

## 2018-12-03 DIAGNOSIS — M6281 Muscle weakness (generalized): Secondary | ICD-10-CM | POA: Diagnosis not present

## 2018-12-20 DIAGNOSIS — L03116 Cellulitis of left lower limb: Secondary | ICD-10-CM | POA: Diagnosis not present

## 2018-12-21 ENCOUNTER — Other Ambulatory Visit: Payer: Self-pay | Admitting: Physician Assistant

## 2018-12-25 NOTE — Progress Notes (Signed)
Structural Heart Clinic Note  Chief Complaint  Patient presents with   Follow-up    post TAVR   History of Present Illness: 78 yo female with history of morbid obesity, hyperlipidemia, former tobacco abuse, prior mitral valve endocarditis, diastolic CHF, TIA in 4709, cephalic vein thrombosis 2957, arthritis and severe aortic stenosis here today for follow up in the valve clinic to discuss her aortic stenosis and possible TAVR. She was referred by Dr. Domenic Polite.  I met her in November 2019 when we had the initial visit to discuss her aortic stenosis. She has been followed for aortic stenosis while living in [facility] and recently moved to [place] to be near her family.  I was able to get the records from [facility]. She was treated for mitral valve endocarditis in the setting of cellulitis in February 2019 in [place], [place]. She was also found to have severe aortic stenosis. She was seen there by the TAVR team. (Dr. Louis Meckel of CT surgery). She had a cardiac catheterization in [place], [place] on 06/20/17. I personally reviewed those cath films and she has minimal CAD. Right heart pressures (RA 10, RV 45/9, PA: 53/13 PW: 16 AO: 108/67, LV 173/15/25. CO 7.24 L/min, CI: 4.4 L/min/m2, AVA 1.08 cm2, AV gadient 45 mmHg). This cath report was scanned into our system and her images have been loaded into the viewing system. Echo 04/13/17 in [place], [place] with AVA 0.9 cm2, mean gradient 44 mmHg, peak gradient 74 mmHg. Reports from that hospitalization mention heavy MAC with mean gradient 7 mmHg. She had a CTA of her abdomen and pelvis at [facility]. The report is scanned into [facility]. There was adequate iliac/femoral access for TAVR. She was noted to have bilateral femoral artery access for potential TAVR by Dr. Hardin Negus. PFTs from [facility] scanned into [facility] ([employer] tab). At that time, she was being treated for choledocholithiasis and had a recent drain in place. ERCP had been performed 05/24/17 and her  drain was removed 06/15/17. It is not clear if she followed up with TAVR team in [place] at [facility] prior to moving to [facility]. She saw Dr. Domenic Polite in September 2019 and he arranged an echo which showed normal LV systolic function with severe LVH and severe aortic stenosis (mean gradient 51 mmHg, DVI 0.18, AVA 0.69 cm2). She was admitted to [facility] 02/06/18 with recurrent acute cholecystitis complicated by choledocholithiasis and sepsis, treated with broad-spectrum antibiotics.  MRCP demonstrated biliary ductal dilatation with a 7 mm distal common bile duct stone.  She was seen for surgical consultation however ultimately underwent endoscopic ultrasound and ERCP with successful stone extraction and biliary sphincterotomy.  She will ultimately require a cholecystectomy. When I met her in November 2019 the drain was still in place so her TAVR procedure was delayed.   She underwent TAVR with a 26 mm Edwards Sapien 3 THV March 2020. Her post operative course was complicated by complete heart block and a dual chamber pacemaker was implanted 07/10/18.    She is here today for follow up. The patient denies any chest pain, dyspnea, palpitations, lower extremity edema, orthopnea, PND, dizziness, near syncope or syncope. Echo shows normal LV systolic funciton. There is trivial to mild PVL. Gradients across the aortic valve are elevated, no significant change since the post implant echo. Having no issues with her gallbladder.   Primary Care Physician: [name], MD Primary Cardiologist: Rozann Lesches Referring Cardiologist: Rozann Lesches  Past Medical History:  Diagnosis Date   Anemia    Anxiety    Arthritis    Cholecystitis    Chronic diastolic CHF (congestive heart failure), NYHA class 2 (HCC)    Complication of anesthesia    very slow to wake up   Depression    History of cellulitis    History of CVA (cerebrovascular accident)    History of endocarditis     Hyperlipemia    Hypertension    Kidney lesion, native, right    on pre TAVR CT scan. 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   26 mm Edwards Sapien 3 transcatheter heart valve placed via percutaneous right transfemoral approach    Severe aortic stenosis    Sleep apnea    Tobacco abuse     Past Surgical History:  Procedure Laterality Date   Abdominal gangrene     ADENOIDECTOMY     Cataract surgery     COLONOSCOPY     ENDOSCOPIC RETROGRADE CHOLANGIOPANCREATOGRAPHY (ERCP) WITH PROPOFOL N/A 02/08/2018   Procedure: ENDOSCOPIC RETROGRADE CHOLANGIOPANCREATOGRAPHY (ERCP) WITH PROPOFOL;  Surgeon: Irving Copas., MD;  Location: [place];  Service: Gastroenterology;  Laterality: N/A;   EUS  02/08/2018   Procedure: UPPER ENDOSCOPIC ULTRASOUND (EUS) LINEAR;  Surgeon: Rush Landmark Telford Nab., MD;  Location: [place];  Service: Gastroenterology;;   EYE SURGERY     IR FLUORO RM 30-60 MIN  03/27/2018   IR PERC CHOLECYSTOSTOMY  02/09/2018   IR RADIOLOGIST EVAL & MGMT  03/26/2018   IR RADIOLOGY PERIPHERAL GUIDED IV START  05/22/2018   IR RADIOLOGY PERIPHERAL GUIDED IV START  05/30/2018   IR RADIOLOGY PERIPHERAL GUIDED IV START  06/05/2018   IR US GUIDE VASC ACCESS RIGHT  05/22/2018   IR US GUIDE VASC ACCESS RIGHT  05/30/2018   IR US GUIDE [place] RIGHT  06/05/2018   PACEMAKER IMPLANT N/A 07/10/2018   Procedure: PACEMAKER IMPLANT;  Surgeon: Evans Lance, MD;  Location: [place] [facility];  Service: Cardiovascular;  Laterality: N/A;   REMOVAL OF STONES  02/08/2018   Procedure: REMOVAL OF STONES;  Surgeon: Rush Landmark Telford Nab., MD;  Location: [place];  Service: Gastroenterology;;   Joan Mayans  02/08/2018   Procedure: Joan Mayans;  Surgeon: Mansouraty, Telford Nab., MD;  Location: [place];  Service: Gastroenterology;;   TONSILLECTOMY     TOOTH EXTRACTION N/A 06/18/2018   Procedure: DENTAL RESTORATION/EXTRACTIONS;  Surgeon: Diona Browner, DDS;  Location: [place];  Service: Oral Surgery;  Laterality: N/A;   TRANSCATHETER AORTIC VALVE REPLACEMENT, TRANSFEMORAL N/A 07/09/2018   Procedure: TRANSCATHETER AORTIC VALVE REPLACEMENT, TRANSFEMORAL;  Surgeon: Sherren Mocha, MD;  Location: [place] [facility];  Service: Cardiovascular;  Laterality: N/A;   Tummy tuck      Current Outpatient Medications  Medication Sig Dispense Refill   acetaminophen (TYLENOL) 650 MG CR tablet Take 1,300 mg by mouth every 8 (eight) hours as needed for pain.     aspirin EC 81 MG tablet Take 81 mg by mouth daily.     Calcium Carbonate (CALCIUM-CARB 600 PO) Take 600 mg by mouth daily.     cholecalciferol (VITAMIN D) 1000 units tablet Take 1,000 Units by mouth daily.     clopidogrel (PLAVIX) 75 MG tablet TAKE 1 TABLET BY MOUTH EVERY DAY 90 tablet 2   ferrous sulfate 325 (65  FE) MG tablet Take 325 mg by mouth daily with breakfast.     gabapentin (NEURONTIN) 300 MG capsule Take 300 mg by mouth 3 (three) times daily.     Multiple Vitamin (MULTIVITAMIN WITH MINERALS) TABS tablet Take 1 tablet by mouth daily.     niacin 500 MG tablet Take 500 mg by mouth daily.     nystatin ointment (MYCOSTATIN) Apply 1 application topically 2 (two) times daily as needed (rash).   2   pravastatin (PRAVACHOL) 20 MG tablet Take 20 mg by mouth at bedtime.      traMADol (ULTRAM) 50 MG tablet Take 50 mg by mouth every 6 (six) hours as needed for moderate pain.     venlafaxine (EFFEXOR) 37.5 MG tablet Take 37.5 mg by mouth daily.     vitamin C (ASCORBIC ACID) 500 MG tablet Take 500 mg by mouth daily.     ZINC OXIDE EX Apply 1 application topically daily as needed (wound care).     No current facility-administered medications for this visit.     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   Food insecurity    Worry: Not on file    Inability: Not on file   Transportation needs    Medical: Not on file    Non-medical: Not on file  Tobacco Use   Smoking status: Former Smoker    Types: Cigarettes    Quit date: 05/23/1978    Years since quitting: 40.6   Smokeless tobacco: Never Used   Tobacco comment: only 1 pack pewr week   Substance and Sexual Activity   Alcohol use: Yes    Comment: Occasional   Drug use: Never   Sexual activity: Not on file  Lifestyle   Physical activity    Days per week: Not on file    Minutes per session: Not on file   Stress: Not on file  Relationships   Social connections    Talks on phone: Not on file    Gets together: Not on file    Attends religious service: Not on file    Active member of club or organization: Not on file    Attends meetings of clubs or organizations: Not on file    Relationship status: Not on file   Intimate partner violence    Fear of current or ex partner: Not on file    Emotionally abused: Not on file    Physically abused: Not on file    Forced sexual activity: Not on file  Other Topics Concern   Not on file  Social History Narrative   Not on file    Family History  Problem Relation Age of Onset   Cancer Mother    Hypertension Father    Arthritis Father    Heart attack Father     Review of Systems:  As stated in the HPI and otherwise negative.   BP 140/70    Pulse 69    Ht 5' 6" (1.676 m)    Wt 270 lb 1.9 oz (122.5 kg)    SpO2 97%    BMI 43.60 kg/m   Physical Examination:  General: Well  developed, well nourished, NAD  HEENT: OP clear, mucus membranes moist  SKIN: warm, dry. No rashes. Neuro: No focal deficits   Musculoskeletal: Muscle strength 5/5 all ext  Psychiatric: Mood and affect normal  Neck: No JVD, no carotid bruits, no thyromegaly, no lymphadenopathy.  Lungs:Clear bilaterally, no wheezes, rhonci, crackles Cardiovascular: Regular rate and rhythm. Soft systolic murmur.  Abdomen:Soft. Bowel sounds present. Non-tender.  Extremities: No lower extremity edema. Pulses are 2 + in the bilateral DP/PT.  Echo 02/01/18: Left ventricle: The cavity size was normal. Wall thickness was   increased in a pattern of severe LVH. Systolic function was   normal. The estimated ejection fraction was in the range of 55%   to 60%. Wall motion was normal; there were no regional wall   motion abnormalities. Doppler parameters are consistent with   abnormal left ventricular relaxation (grade 1 diastolic   dysfunction). Doppler parameters are consistent with high   ventricular filling pressure. - Aortic valve: Severely calcified annulus. Trileaflet; severely   thickened leaflets. There was severe stenosis. Mean gradient (S):   51 mm Hg. VTI ratio of LVOT to aortic valve: 0.18. Valve area   (VTI): 0.67 cm^2. Valve area (Vmax): 0.76 cm^2. Valve area   (Vmean): 0.67 cm^2. - Mitral valve: Severely calcified annulus. The findings are   consistent with mild stenosis. Mean gradient (D): 3 mm Hg. Valve   area by pressure half-time: 2.18 cm^2. Valve area by continuity   equation (using LVOT flow): 1.8 cm^2. - Left atrium: The atrium was moderately dilated. - Atrial septum: There was increased thickness of the septum,   consistent with lipomatous hypertrophy. No defect or patent   foramen ovale was identified.     EKG:  EKG is not ordered today. The ekg ordered today demonstrates   Recent Labs: 07/05/2018: ALT 13; B Natriuretic Peptide 174.4 07/10/2018: Magnesium 1.8 07/11/2018: BUN 19; Hemoglobin 10.6; Platelets 161; Potassium 4.2; Sodium 138 09/18/2018: Creatinine, Ser 0.70   Lipid Panel No results found for:  CHOL, TRIG, HDL, CHOLHDL, VLDL, LDLCALC, LDLDIRECT   Wt Readings from Last 3 Encounters:  12/26/18 270 lb 1.9 oz (122.5 kg)  10/14/18 273 lb 9.6 oz (124.1 kg)  07/10/18 278 lb 10.6 oz (126.4 kg)    Assessment and Plan:   1. Severe aortic stenosis s/p TAVR: She remains on ASA and Plavix. She is now almost six months out from her procedure. Will stop Plavix and continue ASA only. Echo today shows. Normal LV systolic function, trivial to mild perivalvular leak, expected elevated gradients overall unchanged since the echo the day after valve implant.   2. Complete heart block: Pacemaker in place. Followed by Dr. Lovena Le.   3. Renal lesion: A right kidney lesion was noted on the preTAVR CTA in February 2020 and was stable by MRI may 2020. Her daughter is aware. Will follow with primary care.   Would repeat imaging in six months. Her daughter will discuss with Dr. Nevada Crane. We can also arrange this when we see her back in march if not already done.   Current medicines are reviewed at length with the patient today.  The patient does not have concerns regarding medicines.  The following changes have been made:  no change  Labs/ tests ordered today include:   No orders of the defined types were placed in this encounter.    Disposition:   FU with Nell Range PA in the valve clinic in March 2021 for one year TAVR visit and then follow  with Dr. Domenic Polite summer 2021 for long term cardiac care.   Signed, Lauree Chandler, MD 12/26/2018 11:38 AM    [facility] [address], [place], [place]  [address] Phone: [phone]; Fax: [phone]

## 2018-12-26 ENCOUNTER — Other Ambulatory Visit: Payer: Self-pay

## 2018-12-26 ENCOUNTER — Ambulatory Visit (HOSPITAL_COMMUNITY): Payer: Medicare PPO | Attending: Cardiology

## 2018-12-26 ENCOUNTER — Encounter: Payer: Self-pay | Admitting: Cardiovascular Disease

## 2018-12-26 ENCOUNTER — Ambulatory Visit (INDEPENDENT_AMBULATORY_CARE_PROVIDER_SITE_OTHER): Payer: Medicare PPO | Admitting: Cardiovascular Disease

## 2018-12-26 VITALS — BP 140/70 | HR 69 | Ht 66.0 in | Wt 270.1 lb

## 2018-12-26 DIAGNOSIS — I350 Nonrheumatic aortic (valve) stenosis: Secondary | ICD-10-CM | POA: Diagnosis not present

## 2018-12-26 DIAGNOSIS — Z952 Presence of prosthetic heart valve: Secondary | ICD-10-CM

## 2018-12-26 NOTE — Patient Instructions (Addendum)
Medication Instructions:  1) You may discontinue your Plavix on January 09, 2019.  If you need a refill on your cardiac medications before your next appointment, please call your pharmacy.   Lab work: None If you have labs (blood work) drawn today and your tests are completely normal, you will receive your results only by: Marland Kitchen MyChart Message (if you have MyChart) OR . A paper copy in the mail If you have any lab test that is abnormal or we need to change your treatment, we will call you to review the results.  Testing/Procedures: None  Follow-Up:  Continue current plan to see Dawn Leitz, Dawn Gonzales in March and have echocardiogram same day.   At [facility], you and your health needs are our priority.  As part of our continuing mission to provide you with exceptional heart care, we have created designated Provider Care Teams.  These Care Teams include your primary Cardiologist (physician) and Advanced Practice Providers (APPs -  Physician Assistants and Nurse Practitioners) who all work together to provide you with the care you need, when you need it. You will need a follow up appointment in 12 months.  Please call our office 2 months in advance to schedule this appointment.  You may see Dawn Gonzales or one of the following Advanced Practice Providers on your designated Care Team:   Dawn Gonzales, Dawn Gonzales [facility]) . Dawn Barrios, Dawn Gonzales ([place])  Any Other Special Instructions Will Be Listed Below (If Applicable).

## 2019-01-01 DIAGNOSIS — L89893 Pressure ulcer of other site, stage 3: Secondary | ICD-10-CM | POA: Diagnosis not present

## 2019-01-03 DIAGNOSIS — M6281 Muscle weakness (generalized): Secondary | ICD-10-CM | POA: Diagnosis not present

## 2019-01-03 DIAGNOSIS — E661 Drug-induced obesity: Secondary | ICD-10-CM | POA: Diagnosis not present

## 2019-01-13 ENCOUNTER — Ambulatory Visit (INDEPENDENT_AMBULATORY_CARE_PROVIDER_SITE_OTHER): Payer: Medicare PPO | Admitting: *Deleted

## 2019-01-13 DIAGNOSIS — I442 Atrioventricular block, complete: Secondary | ICD-10-CM

## 2019-01-14 DIAGNOSIS — M170 Bilateral primary osteoarthritis of knee: Secondary | ICD-10-CM | POA: Diagnosis not present

## 2019-01-14 DIAGNOSIS — Z6838 Body mass index (BMI) 38.0-38.9, adult: Secondary | ICD-10-CM | POA: Diagnosis not present

## 2019-01-14 DIAGNOSIS — G629 Polyneuropathy, unspecified: Secondary | ICD-10-CM | POA: Diagnosis not present

## 2019-01-14 DIAGNOSIS — F331 Major depressive disorder, recurrent, moderate: Secondary | ICD-10-CM | POA: Diagnosis not present

## 2019-01-14 DIAGNOSIS — E782 Mixed hyperlipidemia: Secondary | ICD-10-CM | POA: Diagnosis not present

## 2019-01-14 DIAGNOSIS — M180 Bilateral primary osteoarthritis of first carpometacarpal joints: Secondary | ICD-10-CM | POA: Diagnosis not present

## 2019-01-14 DIAGNOSIS — I350 Nonrheumatic aortic (valve) stenosis: Secondary | ICD-10-CM | POA: Diagnosis not present

## 2019-01-14 LAB — CUP PACEART REMOTE DEVICE CHECK
Date Time Interrogation Session: 20200915043927
Implantable Lead Implant Date: 20200311
Implantable Lead Implant Date: 20200311
Implantable Lead Location: 753859
Implantable Lead Location: 753860
Implantable Lead Model: 377
Implantable Lead Model: 377
Implantable Lead Serial Number: 81024123
Implantable Lead Serial Number: 81075341
Implantable Pulse Generator Implant Date: 20200311
Pulse Gen Model: 407145
Pulse Gen Serial Number: 69550148

## 2019-01-15 DIAGNOSIS — L89893 Pressure ulcer of other site, stage 3: Secondary | ICD-10-CM | POA: Diagnosis not present

## 2019-01-15 DIAGNOSIS — Z6841 Body Mass Index (BMI) 40.0 and over, adult: Secondary | ICD-10-CM | POA: Diagnosis not present

## 2019-01-15 DIAGNOSIS — K8000 Calculus of gallbladder with acute cholecystitis without obstruction: Secondary | ICD-10-CM | POA: Diagnosis not present

## 2019-01-15 DIAGNOSIS — G9009 Other idiopathic peripheral autonomic neuropathy: Secondary | ICD-10-CM | POA: Diagnosis not present

## 2019-01-15 DIAGNOSIS — G4730 Sleep apnea, unspecified: Secondary | ICD-10-CM | POA: Diagnosis not present

## 2019-01-15 DIAGNOSIS — I350 Nonrheumatic aortic (valve) stenosis: Secondary | ICD-10-CM | POA: Diagnosis not present

## 2019-01-15 DIAGNOSIS — K8043 Calculus of bile duct with acute cholecystitis with obstruction: Secondary | ICD-10-CM | POA: Diagnosis not present

## 2019-01-15 DIAGNOSIS — M6281 Muscle weakness (generalized): Secondary | ICD-10-CM | POA: Diagnosis not present

## 2019-01-15 DIAGNOSIS — Z0000 Encounter for general adult medical examination without abnormal findings: Secondary | ICD-10-CM | POA: Diagnosis not present

## 2019-01-24 ENCOUNTER — Encounter: Payer: Self-pay | Admitting: Cardiology

## 2019-01-24 NOTE — Progress Notes (Signed)
Remote pacemaker transmission.   

## 2019-02-02 DIAGNOSIS — M6281 Muscle weakness (generalized): Secondary | ICD-10-CM | POA: Diagnosis not present

## 2019-02-02 DIAGNOSIS — E661 Drug-induced obesity: Secondary | ICD-10-CM | POA: Diagnosis not present

## 2019-02-05 DIAGNOSIS — G4730 Sleep apnea, unspecified: Secondary | ICD-10-CM | POA: Diagnosis not present

## 2019-02-05 DIAGNOSIS — Z6841 Body Mass Index (BMI) 40.0 and over, adult: Secondary | ICD-10-CM | POA: Diagnosis not present

## 2019-02-05 DIAGNOSIS — Z23 Encounter for immunization: Secondary | ICD-10-CM | POA: Diagnosis not present

## 2019-02-05 DIAGNOSIS — L89893 Pressure ulcer of other site, stage 3: Secondary | ICD-10-CM | POA: Diagnosis not present

## 2019-02-05 DIAGNOSIS — I350 Nonrheumatic aortic (valve) stenosis: Secondary | ICD-10-CM | POA: Diagnosis not present

## 2019-02-05 DIAGNOSIS — K8043 Calculus of bile duct with acute cholecystitis with obstruction: Secondary | ICD-10-CM | POA: Diagnosis not present

## 2019-02-05 DIAGNOSIS — Z0000 Encounter for general adult medical examination without abnormal findings: Secondary | ICD-10-CM | POA: Diagnosis not present

## 2019-02-05 DIAGNOSIS — K8000 Calculus of gallbladder with acute cholecystitis without obstruction: Secondary | ICD-10-CM | POA: Diagnosis not present

## 2019-02-05 DIAGNOSIS — G9009 Other idiopathic peripheral autonomic neuropathy: Secondary | ICD-10-CM | POA: Diagnosis not present

## 2019-02-26 DIAGNOSIS — L89893 Pressure ulcer of other site, stage 3: Secondary | ICD-10-CM | POA: Diagnosis not present

## 2019-03-05 DIAGNOSIS — E661 Drug-induced obesity: Secondary | ICD-10-CM | POA: Diagnosis not present

## 2019-03-05 DIAGNOSIS — M6281 Muscle weakness (generalized): Secondary | ICD-10-CM | POA: Diagnosis not present

## 2019-04-14 ENCOUNTER — Ambulatory Visit (INDEPENDENT_AMBULATORY_CARE_PROVIDER_SITE_OTHER): Payer: Medicare PPO | Admitting: *Deleted

## 2019-04-14 DIAGNOSIS — R001 Bradycardia, unspecified: Secondary | ICD-10-CM

## 2019-04-14 LAB — CUP PACEART REMOTE DEVICE CHECK
Date Time Interrogation Session: 20201214161419
Implantable Lead Implant Date: 20200311
Implantable Lead Implant Date: 20200311
Implantable Lead Location: 753859
Implantable Lead Location: 753860
Implantable Lead Model: 377
Implantable Lead Model: 377
Implantable Lead Serial Number: 81024123
Implantable Lead Serial Number: 81075341
Implantable Pulse Generator Implant Date: 20200311
Pulse Gen Model: 407145
Pulse Gen Serial Number: 69550148

## 2019-04-22 DIAGNOSIS — R112 Nausea with vomiting, unspecified: Secondary | ICD-10-CM | POA: Diagnosis not present

## 2019-04-23 DIAGNOSIS — G9009 Other idiopathic peripheral autonomic neuropathy: Secondary | ICD-10-CM | POA: Diagnosis not present

## 2019-04-23 DIAGNOSIS — Z6841 Body Mass Index (BMI) 40.0 and over, adult: Secondary | ICD-10-CM | POA: Diagnosis not present

## 2019-04-23 DIAGNOSIS — Z23 Encounter for immunization: Secondary | ICD-10-CM | POA: Diagnosis not present

## 2019-04-23 DIAGNOSIS — K8000 Calculus of gallbladder with acute cholecystitis without obstruction: Secondary | ICD-10-CM | POA: Diagnosis not present

## 2019-04-23 DIAGNOSIS — I350 Nonrheumatic aortic (valve) stenosis: Secondary | ICD-10-CM | POA: Diagnosis not present

## 2019-04-23 DIAGNOSIS — Z0000 Encounter for general adult medical examination without abnormal findings: Secondary | ICD-10-CM | POA: Diagnosis not present

## 2019-04-23 DIAGNOSIS — K8043 Calculus of bile duct with acute cholecystitis with obstruction: Secondary | ICD-10-CM | POA: Diagnosis not present

## 2019-04-23 DIAGNOSIS — G4730 Sleep apnea, unspecified: Secondary | ICD-10-CM | POA: Diagnosis not present

## 2019-04-28 DIAGNOSIS — I350 Nonrheumatic aortic (valve) stenosis: Secondary | ICD-10-CM | POA: Diagnosis not present

## 2019-04-28 DIAGNOSIS — M170 Bilateral primary osteoarthritis of knee: Secondary | ICD-10-CM | POA: Diagnosis not present

## 2019-04-28 DIAGNOSIS — F331 Major depressive disorder, recurrent, moderate: Secondary | ICD-10-CM | POA: Diagnosis not present

## 2019-04-28 DIAGNOSIS — E782 Mixed hyperlipidemia: Secondary | ICD-10-CM | POA: Diagnosis not present

## 2019-04-28 DIAGNOSIS — G629 Polyneuropathy, unspecified: Secondary | ICD-10-CM | POA: Diagnosis not present

## 2019-05-16 DIAGNOSIS — G629 Polyneuropathy, unspecified: Secondary | ICD-10-CM | POA: Diagnosis not present

## 2019-05-16 DIAGNOSIS — M170 Bilateral primary osteoarthritis of knee: Secondary | ICD-10-CM | POA: Diagnosis not present

## 2019-05-16 DIAGNOSIS — I5032 Chronic diastolic (congestive) heart failure: Secondary | ICD-10-CM | POA: Diagnosis not present

## 2019-05-16 DIAGNOSIS — Z950 Presence of cardiac pacemaker: Secondary | ICD-10-CM | POA: Diagnosis not present

## 2019-05-16 DIAGNOSIS — I110 Hypertensive heart disease with heart failure: Secondary | ICD-10-CM | POA: Diagnosis not present

## 2019-05-16 DIAGNOSIS — M545 Low back pain: Secondary | ICD-10-CM | POA: Diagnosis not present

## 2019-05-16 DIAGNOSIS — Z952 Presence of prosthetic heart valve: Secondary | ICD-10-CM | POA: Diagnosis not present

## 2019-05-16 DIAGNOSIS — M19041 Primary osteoarthritis, right hand: Secondary | ICD-10-CM | POA: Diagnosis not present

## 2019-05-16 DIAGNOSIS — Z48812 Encounter for surgical aftercare following surgery on the circulatory system: Secondary | ICD-10-CM | POA: Diagnosis not present

## 2019-05-18 NOTE — Progress Notes (Signed)
PPM Remote  

## 2019-07-04 ENCOUNTER — Ambulatory Visit
Admission: EM | Admit: 2019-07-04 | Discharge: 2019-07-04 | Disposition: A | Discharge status: Home / Self Care | Payer: Medicare PPO | Source: Home / Self Care

## 2019-07-04 ENCOUNTER — Inpatient Hospital Stay (HOSPITAL_COMMUNITY)
Admission: EM | Admit: 2019-07-04 | Discharge: 2019-07-10 | DRG: 388 | Disposition: A | Discharge status: Home / Self Care | Payer: Medicare PPO | Attending: Family Medicine | Admitting: Family Medicine

## 2019-07-04 ENCOUNTER — Other Ambulatory Visit: Payer: Self-pay

## 2019-07-04 ENCOUNTER — Emergency Department (HOSPITAL_COMMUNITY): Payer: Medicare PPO

## 2019-07-04 ENCOUNTER — Encounter (HOSPITAL_COMMUNITY): Payer: Self-pay

## 2019-07-04 DIAGNOSIS — E669 Obesity, unspecified: Secondary | ICD-10-CM | POA: Diagnosis present

## 2019-07-04 DIAGNOSIS — K8020 Calculus of gallbladder without cholecystitis without obstruction: Secondary | ICD-10-CM | POA: Diagnosis present

## 2019-07-04 DIAGNOSIS — E785 Hyperlipidemia, unspecified: Secondary | ICD-10-CM | POA: Diagnosis present

## 2019-07-04 DIAGNOSIS — K567 Ileus, unspecified: Secondary | ICD-10-CM

## 2019-07-04 DIAGNOSIS — I340 Nonrheumatic mitral (valve) insufficiency: Secondary | ICD-10-CM | POA: Diagnosis not present

## 2019-07-04 DIAGNOSIS — Z8673 Personal history of transient ischemic attack (TIA), and cerebral infarction without residual deficits: Secondary | ICD-10-CM | POA: Diagnosis not present

## 2019-07-04 DIAGNOSIS — I110 Hypertensive heart disease with heart failure: Secondary | ICD-10-CM | POA: Diagnosis present

## 2019-07-04 DIAGNOSIS — K56600 Partial intestinal obstruction, unspecified as to cause: Secondary | ICD-10-CM | POA: Diagnosis present

## 2019-07-04 DIAGNOSIS — Z8261 Family history of arthritis: Secondary | ICD-10-CM

## 2019-07-04 DIAGNOSIS — G4733 Obstructive sleep apnea (adult) (pediatric): Secondary | ICD-10-CM | POA: Diagnosis present

## 2019-07-04 DIAGNOSIS — Z7982 Long term (current) use of aspirin: Secondary | ICD-10-CM | POA: Diagnosis not present

## 2019-07-04 DIAGNOSIS — I10 Essential (primary) hypertension: Secondary | ICD-10-CM | POA: Diagnosis not present

## 2019-07-04 DIAGNOSIS — Z953 Presence of xenogenic heart valve: Secondary | ICD-10-CM | POA: Diagnosis not present

## 2019-07-04 DIAGNOSIS — M1990 Unspecified osteoarthritis, unspecified site: Secondary | ICD-10-CM | POA: Diagnosis present

## 2019-07-04 DIAGNOSIS — K529 Noninfective gastroenteritis and colitis, unspecified: Secondary | ICD-10-CM | POA: Diagnosis present

## 2019-07-04 DIAGNOSIS — I350 Nonrheumatic aortic (valve) stenosis: Secondary | ICD-10-CM | POA: Diagnosis not present

## 2019-07-04 DIAGNOSIS — Z6841 Body Mass Index (BMI) 40.0 and over, adult: Secondary | ICD-10-CM | POA: Diagnosis not present

## 2019-07-04 DIAGNOSIS — J9601 Acute respiratory failure with hypoxia: Secondary | ICD-10-CM | POA: Diagnosis present

## 2019-07-04 DIAGNOSIS — I442 Atrioventricular block, complete: Secondary | ICD-10-CM | POA: Diagnosis present

## 2019-07-04 DIAGNOSIS — R0602 Shortness of breath: Secondary | ICD-10-CM | POA: Diagnosis not present

## 2019-07-04 DIAGNOSIS — Z8249 Family history of ischemic heart disease and other diseases of the circulatory system: Secondary | ICD-10-CM | POA: Diagnosis not present

## 2019-07-04 DIAGNOSIS — Z950 Presence of cardiac pacemaker: Secondary | ICD-10-CM

## 2019-07-04 DIAGNOSIS — I5032 Chronic diastolic (congestive) heart failure: Secondary | ICD-10-CM | POA: Diagnosis present

## 2019-07-04 DIAGNOSIS — F329 Major depressive disorder, single episode, unspecified: Secondary | ICD-10-CM | POA: Diagnosis present

## 2019-07-04 DIAGNOSIS — K819 Cholecystitis, unspecified: Secondary | ICD-10-CM

## 2019-07-04 DIAGNOSIS — G629 Polyneuropathy, unspecified: Secondary | ICD-10-CM | POA: Diagnosis present

## 2019-07-04 DIAGNOSIS — R1084 Generalized abdominal pain: Secondary | ICD-10-CM | POA: Diagnosis not present

## 2019-07-04 DIAGNOSIS — K563 Gallstone ileus: Principal | ICD-10-CM | POA: Diagnosis present

## 2019-07-04 DIAGNOSIS — Z87891 Personal history of nicotine dependence: Secondary | ICD-10-CM | POA: Diagnosis not present

## 2019-07-04 DIAGNOSIS — Z20822 Contact with and (suspected) exposure to covid-19: Secondary | ICD-10-CM | POA: Diagnosis present

## 2019-07-04 DIAGNOSIS — I351 Nonrheumatic aortic (valve) insufficiency: Secondary | ICD-10-CM | POA: Diagnosis not present

## 2019-07-04 DIAGNOSIS — K625 Hemorrhage of anus and rectum: Secondary | ICD-10-CM | POA: Diagnosis not present

## 2019-07-04 DIAGNOSIS — Z952 Presence of prosthetic heart valve: Secondary | ICD-10-CM | POA: Diagnosis not present

## 2019-07-04 DIAGNOSIS — R112 Nausea with vomiting, unspecified: Secondary | ICD-10-CM | POA: Diagnosis not present

## 2019-07-04 DIAGNOSIS — I342 Nonrheumatic mitral (valve) stenosis: Secondary | ICD-10-CM | POA: Diagnosis not present

## 2019-07-04 DIAGNOSIS — I872 Venous insufficiency (chronic) (peripheral): Secondary | ICD-10-CM | POA: Diagnosis present

## 2019-07-04 DIAGNOSIS — R1110 Vomiting, unspecified: Secondary | ICD-10-CM | POA: Diagnosis not present

## 2019-07-04 DIAGNOSIS — Z01810 Encounter for preprocedural cardiovascular examination: Secondary | ICD-10-CM | POA: Diagnosis not present

## 2019-07-04 DIAGNOSIS — I878 Other specified disorders of veins: Secondary | ICD-10-CM | POA: Diagnosis present

## 2019-07-04 HISTORY — DX: Essential (primary) hypertension: I10

## 2019-07-04 HISTORY — DX: Cyst of kidney, acquired: N28.1

## 2019-07-04 HISTORY — DX: Chronic diastolic (congestive) heart failure: I50.32

## 2019-07-04 LAB — COMPREHENSIVE METABOLIC PANEL
ALT: 11 U/L (ref 0–44)
AST: 18 U/L (ref 15–41)
Albumin: 3.6 g/dL (ref 3.5–5.0)
Alkaline Phosphatase: 69 U/L (ref 38–126)
Anion gap: 8 (ref 5–15)
BUN: 27 mg/dL — ABNORMAL HIGH (ref 8–23)
CO2: 34 mmol/L — ABNORMAL HIGH (ref 22–32)
Calcium: 9.4 mg/dL (ref 8.9–10.3)
Chloride: 96 mmol/L — ABNORMAL LOW (ref 98–111)
Creatinine, Ser: 0.81 mg/dL (ref 0.44–1.00)
GFR calc Af Amer: >60 mL/min (ref >60)
GFR calc non Af Amer: >60 mL/min (ref >60)
Glucose, Bld: 115 mg/dL — ABNORMAL HIGH (ref 70–99)
Potassium: 4.4 mmol/L (ref 3.5–5.1)
Sodium: 138 mmol/L (ref 135–145)
Total Bilirubin: 0.5 mg/dL (ref 0.3–1.2)
Total Protein: 7.4 g/dL (ref 6.5–8.1)

## 2019-07-04 LAB — CBC WITH DIFFERENTIAL/PLATELET
Abs Immature Granulocytes: 0.05 10*3/uL (ref 0.00–0.07)
Basophils Absolute: 0 10*3/uL (ref 0.0–0.1)
Basophils Relative: 0 %
Eosinophils Absolute: 0.1 10*3/uL (ref 0.0–0.5)
Eosinophils Relative: 1 %
HCT: 45.3 % (ref 36.0–46.0)
Hemoglobin: 14 g/dL (ref 12.0–15.0)
Immature Granulocytes: 0 %
Lymphocytes Relative: 12 %
Lymphs Abs: 1.6 10*3/uL (ref 0.7–4.0)
MCH: 29.6 pg (ref 26.0–34.0)
MCHC: 30.9 g/dL (ref 30.0–36.0)
MCV: 95.8 fL (ref 80.0–100.0)
Monocytes Absolute: 0.9 10*3/uL (ref 0.1–1.0)
Monocytes Relative: 7 %
Neutro Abs: 10.3 10*3/uL — ABNORMAL HIGH (ref 1.7–7.7)
Neutrophils Relative %: 80 %
Platelets: 265 10*3/uL (ref 150–400)
RBC: 4.73 MIL/uL (ref 3.87–5.11)
RDW: 15.6 % — ABNORMAL HIGH (ref 11.5–15.5)
WBC: 12.9 10*3/uL — ABNORMAL HIGH (ref 4.0–10.5)
nRBC: 0 % (ref 0.0–0.2)

## 2019-07-04 LAB — POC SARS CORONAVIRUS 2 AG -  ED: SARS Coronavirus 2 Ag: NEGATIVE

## 2019-07-04 LAB — RESPIRATORY PANEL BY RT PCR (FLU A&B, COVID)
Influenza A by PCR: NEGATIVE
Influenza B by PCR: NEGATIVE
SARS Coronavirus 2 by RT PCR: NEGATIVE

## 2019-07-04 LAB — LIPASE, BLOOD: Lipase: 14 U/L (ref 11–51)

## 2019-07-04 MED ORDER — CIPROFLOXACIN IN D5W 400 MG/200ML IV SOLN
400.0000 mg | Freq: Once | INTRAVENOUS | Status: AC
  Administered 2019-07-05: 400 mg via INTRAVENOUS
  Filled 2019-07-04: qty 200

## 2019-07-04 MED ORDER — METRONIDAZOLE IN NACL 5-0.79 MG/ML-% IV SOLN
500.0000 mg | Freq: Once | INTRAVENOUS | Status: AC
  Administered 2019-07-05: 500 mg via INTRAVENOUS
  Filled 2019-07-04: qty 100

## 2019-07-04 MED ORDER — IOHEXOL 300 MG/ML  SOLN
100.0000 mL | Freq: Once | INTRAMUSCULAR | Status: AC | PRN
  Administered 2019-07-04: 100 mL via INTRAVENOUS

## 2019-07-04 NOTE — ED Provider Notes (Signed)
[facility] EMERGENCY DEPARTMENT Provider Note   CSN: [id number] Arrival date & time: 07/04/19  1631     History Chief Complaint  Patient presents with  . Emesis    Dawn Gonzales is a 79 y.o. female.  HPI Patient presents to the emergency department for evaluation of multiple episodes of nausea, vomiting and lower abdominal pain onset yesterday.  She has also had some shortness of breath and cough but denies any fever.  She was initially seen at local urgent care but sent to the emergency department for hypoxia.  She has not had any known sick contacts, no known Covid contacts and no recent travel.  She has had 1 dose of her Covid vaccine.  She denies dysuria or diarrhea.    Past Medical History:  Diagnosis Date  . Anemia   . Anxiety   . Arthritis   . Cholecystitis   . Chronic diastolic CHF (congestive heart failure), NYHA class 2 (HCC)   . Complication of anesthesia    very slow to wake up  . Depression   . History of cellulitis   . History of CVA (cerebrovascular accident)   . History of endocarditis   . Hyperlipemia   . Hypertension   . Kidney lesion, native, right    on pre TAVR CT scan. will require follow up wtih a MRI  . Morbid obesity (HCC)   . Neuropathy   . Physical deconditioning   . Right bundle branch block (RBBB)   . 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 Severe aortic stenosis   . Sleep apnea   . Tobacco abuse     Patient Active Problem List   Diagnosis Date Noted  . Kidney lesion, native, right 07/11/2018  . 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 Sleep apnea   . Hypertension   . Hyperlipemia   . Chronic diastolic CHF (congestive heart failure), NYHA class 2 (HCC)   . Neuropathy   . Physical deconditioning   . History of TIA (transient ischemic attack)   . Right bundle branch block  (RBBB)   . Obesity, Class III, BMI 40-49.9 (morbid obesity) (HCC)   . Junctional bradycardia   . Preoperative cardiovascular examination   . Severe aortic stenosis   . Choledocholithiasis   . Epigastric pain 02/06/2018    Past Surgical History:  Procedure Laterality Date  . Abdominal gangrene    . ADENOIDECTOMY    . Cataract surgery    . COLONOSCOPY    . ENDOSCOPIC RETROGRADE CHOLANGIOPANCREATOGRAPHY (ERCP) WITH PROPOFOL N/A 02/08/2018   Procedure: ENDOSCOPIC RETROGRADE CHOLANGIOPANCREATOGRAPHY (ERCP) WITH PROPOFOL;  Surgeon: Meridee Score Netty Starring., MD;  Location: [facility] ENDOSCOPY;  Service: Gastroenterology;  Laterality: N/A;  . EUS  02/08/2018   Procedure: UPPER ENDOSCOPIC ULTRASOUND (EUS) LINEAR;  Surgeon: Lemar Lofty., MD;  Location: [facility] ENDOSCOPY;  Service: Gastroenterology;;  . EYE SURGERY    . IR FLUORO RM 30-60 MIN  03/27/2018  . IR PERC CHOLECYSTOSTOMY  02/09/2018  . IR RADIOLOGIST EVAL & MGMT  03/26/2018  . IR RADIOLOGY PERIPHERAL GUIDED IV START  05/22/2018  . IR RADIOLOGY PERIPHERAL GUIDED IV START  05/30/2018  . IR RADIOLOGY PERIPHERAL GUIDED IV START  06/05/2018  . IR US GUIDE VASC ACCESS RIGHT  05/22/2018  . IR US GUIDE VASC ACCESS RIGHT  05/30/2018  . IR US GUIDE VASC ACCESS RIGHT  06/05/2018  .  PACEMAKER IMPLANT N/A 07/10/2018   Procedure: PACEMAKER IMPLANT;  Surgeon: Evans Lance, MD;  Location: [place] CV LAB;  Service: Cardiovascular;  Laterality: N/A;  . REMOVAL OF STONES  02/08/2018   Procedure: REMOVAL OF STONES;  Surgeon: Irving Copas., MD;  Location: [place];  Service: Gastroenterology;;  . Joan Mayans  02/08/2018   Procedure: Joan Mayans;  Surgeon: Mansouraty, Telford Nab., MD;  Location: [place];  Service: Gastroenterology;;  . TONSILLECTOMY    . TOOTH EXTRACTION N/A 06/18/2018   Procedure: DENTAL RESTORATION/EXTRACTIONS;  Surgeon: Diona Browner, DDS;  Location: [place];  Service: Oral Surgery;  Laterality: N/A;  .  TRANSCATHETER AORTIC VALVE REPLACEMENT, TRANSFEMORAL N/A 07/09/2018   Procedure: TRANSCATHETER AORTIC VALVE REPLACEMENT, TRANSFEMORAL;  Surgeon: Sherren Mocha, MD;  Location: [place] CV LAB;  Service: Cardiovascular;  Laterality: N/A;  . Tummy tuck       OB History   No obstetric history on file.     Family History  Problem Relation Age of Onset  . Cancer Mother   . Hypertension Father   . Arthritis Father   . Heart attack Father     Social History   Tobacco Use  . Smoking status: Former Smoker    Types: Cigarettes    Quit date: 05/23/1978    Years since quitting: 41.1  . Smokeless tobacco: Never Used  . Tobacco comment: only 1 pack pewr week   Substance Use Topics  . Alcohol use: Not Currently    Comment: Occasional  . Drug use: Never    Home Medications Prior to Admission medications   Medication Sig Start Date End Date Taking? Authorizing Provider  acetaminophen (TYLENOL) 650 MG CR tablet Take 1,300 mg by mouth every 8 (eight) hours as needed for pain.    [provider]  aspirin EC 81 MG tablet Take 81 mg by mouth daily.    [provider]  Calcium Carbonate (CALCIUM-CARB 600 PO) Take 600 mg by mouth daily.    [provider]  cholecalciferol (VITAMIN D) 1000 units tablet Take 1,000 Units by mouth daily.    [provider]  clopidogrel (PLAVIX) 75 MG tablet TAKE 1 TABLET BY MOUTH EVERY DAY 12/23/18   Eileen Stanford, PA-C  ferrous sulfate 325 (65 FE) MG tablet Take 325 mg by mouth daily with breakfast.    [provider]  gabapentin (NEURONTIN) 300 MG capsule Take 300 mg by mouth 3 (three) times daily.    [provider]  Multiple Vitamin (MULTIVITAMIN WITH MINERALS) TABS tablet Take 1 tablet by mouth daily.    [provider]  niacin 500 MG tablet Take 500 mg by mouth daily.    [provider]  nystatin ointment (MYCOSTATIN) Apply 1 application topically 2 (two) times daily as needed  (rash).  01/26/18   [provider]  pravastatin (PRAVACHOL) 20 MG tablet Take 20 mg by mouth at bedtime.     [provider]  traMADol (ULTRAM) 50 MG tablet Take 50 mg by mouth every 6 (six) hours as needed for moderate pain.    [provider]  venlafaxine (EFFEXOR) 37.5 MG tablet Take 37.5 mg by mouth daily.    [provider]  vitamin C (ASCORBIC ACID) 500 MG tablet Take 500 mg by mouth daily.    [provider]  ZINC OXIDE EX Apply 1 application topically daily as needed (wound care).    [provider]    Allergies    Patient has no  known allergies.  Review of Systems   Review of Systems  Constitutional: Negative for fever.  HENT: Positive for rhinorrhea. Negative for congestion and sore throat.   Respiratory: Positive for cough and shortness of breath.   Cardiovascular: Negative for chest pain.  Gastrointestinal: Positive for abdominal pain, nausea and vomiting. Negative for diarrhea.  Genitourinary: Negative for dysuria.  Musculoskeletal: Negative for myalgias.  Skin: Negative for rash.  Neurological: Negative for headaches.  Psychiatric/Behavioral: Negative for behavioral problems.    Physical Exam Updated Vital Signs BP (!) 153/78 (BP Location: Right Arm)   Pulse 79   Temp 98.6 F (37 C) (Oral)   Resp 18   Ht 5\' 6"  (1.676 m)   Wt 121.6 kg   SpO2 95%   BMI 43.26 kg/m   Physical Exam Constitutional:      Appearance: Normal appearance.  HENT:     Head: Normocephalic and atraumatic.     Nose: Nose normal.     Mouth/Throat:     Mouth: Mucous membranes are moist.  Eyes:     Extraocular Movements: Extraocular movements intact.     Conjunctiva/sclera: Conjunctivae normal.  Cardiovascular:     Rate and Rhythm: Normal rate.  Pulmonary:     Effort: Pulmonary effort is normal.     Breath sounds: Normal breath sounds.  Abdominal:     General: Bowel sounds are normal. There is no distension.     Palpations:  Abdomen is soft.     Tenderness: There is abdominal tenderness (diffuse lower). There is no guarding.     Comments: Neg Murphy's sign  Musculoskeletal:        General: No swelling. Normal range of motion.     Cervical back: Neck supple.  Skin:    General: Skin is warm and dry.  Neurological:     General: No focal deficit present.     Mental Status: She is alert.  Psychiatric:        Mood and Affect: Mood normal.     ED Results / Procedures / Treatments   Labs (all labs ordered are listed, but only abnormal results are displayed) Labs Reviewed  CBC WITH DIFFERENTIAL/PLATELET - Abnormal; Notable for the following components:      Result Value   WBC 12.9 (*)    RDW 15.6 (*)    Neutro Abs 10.3 (*)    All other components within normal limits  COMPREHENSIVE METABOLIC PANEL - Abnormal; Notable for the following components:   Chloride 96 (*)    CO2 34 (*)    Glucose, Bld 115 (*)    BUN 27 (*)    All other components within normal limits  RESPIRATORY PANEL BY RT PCR (FLU A&B, COVID)  LIPASE, BLOOD  URINALYSIS, ROUTINE W REFLEX MICROSCOPIC  POC SARS CORONAVIRUS 2 AG -  ED    EKG EKG Interpretation  Date/Time:  Friday July 04 2019 18:15:31 EST Ventricular Rate:  80 PR Interval:    QRS Duration: 175 QT Interval:  429 QTC Calculation: 495 R Axis:   -15 Text Interpretation: Sinus rhythm Right bundle branch block Baseline wander in lead(s) V1 V2 No significant change since last tracing Confirmed by [id number]  MD, Yovanny Coats [phone]) on 07/04/2019 6:21:13 PM   Radiology CT Abdomen Pelvis W Contrast  Result Date: 07/04/2019 CLINICAL DATA:  Nausea and vomiting for 2 days. EXAM: CT ABDOMEN AND PELVIS WITH CONTRAST TECHNIQUE: Multidetector CT imaging of the abdomen and pelvis was performed using the standard protocol following bolus  administration of intravenous contrast. CONTRAST:  OMNIPAQUE IOHEXOL 300 MG/ML  SOLN COMPARISON:  February 09, 2018 FINDINGS: Lower chest: No acute  abnormality. Hepatobiliary: No focal liver abnormality is seen. There is moderate severity central intrahepatic biliary dilatation with dilatation of the common bile duct. A moderate amount of intrahepatic biliary air is seen which extends into the common bile duct. Gallstones and a moderate amount of heterogeneous attenuation is seen within the gallbladder lumen. A mild-to-moderate amount of air is also seen within the gallbladder. Pancreas: Unremarkable. No pancreatic ductal dilatation or surrounding inflammatory changes. Spleen: Normal in size without focal abnormality. Adrenals/Urinary Tract: Adrenal glands are unremarkable. Kidneys are normal in size, without renal calculi or hydronephrosis. A 1.6 cm simple cyst is seen within the lateral aspect of the mid right kidney. A 2.0 cm exophytic cyst is seen along the medial aspect of the mid left kidney. Bladder is unremarkable. Stomach/Bowel: Stomach is within normal limits. Appendix appears normal. Multiple dilated loops of fluid-filled small bowel are seen throughout the abdomen and pelvis (maximum small bowel diameter of approximately 4.0 cm). Mild Peri intestinal inflammatory fat stranding is seen throughout the mesentery. Vascular/Lymphatic: Moderate to marked severity aortic tortuosity and atherosclerosis. No enlarged abdominal or pelvic lymph nodes. Reproductive: Uterus and bilateral adnexa are unremarkable. Other: No abdominal wall hernia or abnormality. No abdominopelvic ascites. Musculoskeletal: Degenerative changes are seen throughout the lumbar spine. IMPRESSION: 1. Multiple dilated loops of fluid-filled small bowel throughout the abdomen and pelvis, with associated mild peri intestinal inflammatory fat stranding. This is consistent with an infectious or inflammatory enteritis and subsequent ileus. 2. Cholelithiasis, moderate severity central intrahepatic biliary dilatation and common bile duct dilatation with a moderate amount of pneumobilia and air  within the gallbladder lumen. This is consistent with sequelae associated with emphysematous cholecystitis. Aortic Atherosclerosis (ICD10-I70.0). Electronically Signed   By: Aram Candela M.D.   On: 07/04/2019 23:13   DG Chest Portable 1 View  Result Date: 07/04/2019 CLINICAL DATA:  Shortness of breath. Nausea and vomiting for 2 days. EXAM: PORTABLE CHEST 1 VIEW COMPARISON:  Radiograph 07/11/2018 FINDINGS: Lower most aspect of the chest not included in the field of view. Left-sided pacemaker remains in place. Stable cardiomegaly with transcatheter aortic valve replacement. No focal airspace disease or pulmonary edema. No large pleural effusion. No pneumothorax. Degenerative change of both shoulders. No acute osseous abnormalities are seen. IMPRESSION: Stable cardiomegaly. No acute abnormality. Electronically Signed   By: Narda Rutherford M.D.   On: 07/04/2019 19:04    Procedures Procedures (including critical care time)  Medications Ordered in ED Medications  ciprofloxacin (CIPRO) IVPB 400 mg (has no administration in time range)  metroNIDAZOLE (FLAGYL) IVPB 500 mg (has no administration in time range)  iohexol (OMNIPAQUE) 300 MG/ML solution 100 mL (100 mLs Intravenous Contrast Given 07/04/19 2239)    ED Course  I have reviewed the triage vital signs and the nursing notes.  Pertinent labs & imaging results that were available during my care of the patient were reviewed by me and considered in my medical decision making (see chart for details).  Clinical Course as of Jul 03 2353  Fri Jul 04, 2019  1700 Patient with nonspecific hypoxia at outpatient urgent care, also have intermittent epoxy at the time of my evaluation, she was approximately 80% on room air and placed on 2 L nasal cannula with good improvement.   [CS]  1900 Per RN there was a delay in getting patient's labs collected due to difficulty with IV  access.   [CS]  2200 Patient's labs reviewed, mild elevation in white blood cell  count but otherwise unremarkable including Covid test.   [CS]  2321 Patient resting comfortably in no distress, no further hypoxic episodes while on supplemental nasal cannula.  Patient CT scan images and results were reviewed. The patient continues to have diffuse abdominal tenderness, no focal RUQ tenderness and Murphy's sign remains negative. Will discuss with general surgery and expect admission to hospitalist.    [CS]  2337 Spoke with Dr. Sharl Ma who will admit.    [CS]  2352 Spoke with Dr. Lovell Sheehan who agrees with plan for admission, antibiotics and reassessment in the AM. Likely will need [place] in the AM.    [CS]    Clinical Course User Index [CS] Pollyann Savoy, MD   Final Clinical Impression(s) / ED Diagnoses Final diagnoses:  Ileus (HCC)  Enteritis  Acute respiratory failure with hypoxia [facility])    Rx / DC Orders ED Discharge Orders    None       Pollyann Savoy, MD 07/04/19 2355

## 2019-07-04 NOTE — ED Triage Notes (Addendum)
Pt reports cough, runny  Nose, vomiting, x 2 days.  Pt went to [facility] and was sent here because her o2 there was in the 80's on room air and daughter told them she was concerned about pt's gall bladder.

## 2019-07-04 NOTE — ED Triage Notes (Signed)
Pt presents to UC w/ c/o nausea and vomiting x2 days. Pt states she is able to keep some liquids down. C/o runny nose and cough.

## 2019-07-04 NOTE — ED Notes (Signed)
Patient transported to CT 

## 2019-07-05 ENCOUNTER — Inpatient Hospital Stay (HOSPITAL_COMMUNITY): Payer: Medicare PPO

## 2019-07-05 DIAGNOSIS — Z8673 Personal history of transient ischemic attack (TIA), and cerebral infarction without residual deficits: Secondary | ICD-10-CM | POA: Diagnosis not present

## 2019-07-05 DIAGNOSIS — I351 Nonrheumatic aortic (valve) insufficiency: Secondary | ICD-10-CM | POA: Diagnosis not present

## 2019-07-05 DIAGNOSIS — Z953 Presence of xenogenic heart valve: Secondary | ICD-10-CM | POA: Diagnosis not present

## 2019-07-05 DIAGNOSIS — K56600 Partial intestinal obstruction, unspecified as to cause: Secondary | ICD-10-CM | POA: Diagnosis present

## 2019-07-05 DIAGNOSIS — K567 Ileus, unspecified: Secondary | ICD-10-CM

## 2019-07-05 DIAGNOSIS — K563 Gallstone ileus: Secondary | ICD-10-CM | POA: Diagnosis present

## 2019-07-05 DIAGNOSIS — K8020 Calculus of gallbladder without cholecystitis without obstruction: Secondary | ICD-10-CM | POA: Diagnosis present

## 2019-07-05 DIAGNOSIS — K529 Noninfective gastroenteritis and colitis, unspecified: Secondary | ICD-10-CM | POA: Diagnosis present

## 2019-07-05 DIAGNOSIS — M1990 Unspecified osteoarthritis, unspecified site: Secondary | ICD-10-CM | POA: Diagnosis present

## 2019-07-05 DIAGNOSIS — Z87891 Personal history of nicotine dependence: Secondary | ICD-10-CM | POA: Diagnosis not present

## 2019-07-05 DIAGNOSIS — Z8249 Family history of ischemic heart disease and other diseases of the circulatory system: Secondary | ICD-10-CM | POA: Diagnosis not present

## 2019-07-05 DIAGNOSIS — Z950 Presence of cardiac pacemaker: Secondary | ICD-10-CM | POA: Diagnosis not present

## 2019-07-05 DIAGNOSIS — I342 Nonrheumatic mitral (valve) stenosis: Secondary | ICD-10-CM | POA: Diagnosis not present

## 2019-07-05 DIAGNOSIS — Z8261 Family history of arthritis: Secondary | ICD-10-CM | POA: Diagnosis not present

## 2019-07-05 DIAGNOSIS — I110 Hypertensive heart disease with heart failure: Secondary | ICD-10-CM | POA: Diagnosis present

## 2019-07-05 DIAGNOSIS — Z952 Presence of prosthetic heart valve: Secondary | ICD-10-CM | POA: Diagnosis not present

## 2019-07-05 DIAGNOSIS — G629 Polyneuropathy, unspecified: Secondary | ICD-10-CM | POA: Diagnosis present

## 2019-07-05 DIAGNOSIS — J9601 Acute respiratory failure with hypoxia: Secondary | ICD-10-CM | POA: Diagnosis present

## 2019-07-05 DIAGNOSIS — K625 Hemorrhage of anus and rectum: Secondary | ICD-10-CM | POA: Diagnosis not present

## 2019-07-05 DIAGNOSIS — I350 Nonrheumatic aortic (valve) stenosis: Secondary | ICD-10-CM | POA: Diagnosis not present

## 2019-07-05 DIAGNOSIS — I5032 Chronic diastolic (congestive) heart failure: Secondary | ICD-10-CM | POA: Diagnosis present

## 2019-07-05 DIAGNOSIS — F329 Major depressive disorder, single episode, unspecified: Secondary | ICD-10-CM | POA: Diagnosis present

## 2019-07-05 DIAGNOSIS — I340 Nonrheumatic mitral (valve) insufficiency: Secondary | ICD-10-CM | POA: Diagnosis not present

## 2019-07-05 DIAGNOSIS — G4733 Obstructive sleep apnea (adult) (pediatric): Secondary | ICD-10-CM | POA: Diagnosis present

## 2019-07-05 DIAGNOSIS — Z7982 Long term (current) use of aspirin: Secondary | ICD-10-CM | POA: Diagnosis not present

## 2019-07-05 DIAGNOSIS — E785 Hyperlipidemia, unspecified: Secondary | ICD-10-CM | POA: Diagnosis present

## 2019-07-05 DIAGNOSIS — Z6841 Body Mass Index (BMI) 40.0 and over, adult: Secondary | ICD-10-CM | POA: Diagnosis not present

## 2019-07-05 DIAGNOSIS — I442 Atrioventricular block, complete: Secondary | ICD-10-CM | POA: Diagnosis present

## 2019-07-05 DIAGNOSIS — Z20822 Contact with and (suspected) exposure to covid-19: Secondary | ICD-10-CM | POA: Diagnosis present

## 2019-07-05 DIAGNOSIS — Z01810 Encounter for preprocedural cardiovascular examination: Secondary | ICD-10-CM | POA: Diagnosis not present

## 2019-07-05 LAB — COMPREHENSIVE METABOLIC PANEL
ALT: 10 U/L (ref 0–44)
AST: 16 U/L (ref 15–41)
Albumin: 3.1 g/dL — ABNORMAL LOW (ref 3.5–5.0)
Alkaline Phosphatase: 64 U/L (ref 38–126)
Anion gap: 8 (ref 5–15)
BUN: 24 mg/dL — ABNORMAL HIGH (ref 8–23)
CO2: 32 mmol/L (ref 22–32)
Calcium: 8.9 mg/dL (ref 8.9–10.3)
Chloride: 98 mmol/L (ref 98–111)
Creatinine, Ser: 0.68 mg/dL (ref 0.44–1.00)
GFR calc Af Amer: >60 mL/min (ref >60)
GFR calc non Af Amer: >60 mL/min (ref >60)
Glucose, Bld: 109 mg/dL — ABNORMAL HIGH (ref 70–99)
Potassium: 4.1 mmol/L (ref 3.5–5.1)
Sodium: 138 mmol/L (ref 135–145)
Total Bilirubin: 0.8 mg/dL (ref 0.3–1.2)
Total Protein: 6.5 g/dL (ref 6.5–8.1)

## 2019-07-05 LAB — CBC
HCT: 42.4 % (ref 36.0–46.0)
Hemoglobin: 13 g/dL (ref 12.0–15.0)
MCH: 29.5 pg (ref 26.0–34.0)
MCHC: 30.7 g/dL (ref 30.0–36.0)
MCV: 96.1 fL (ref 80.0–100.0)
Platelets: 272 10*3/uL (ref 150–400)
RBC: 4.41 MIL/uL (ref 3.87–5.11)
RDW: 15.3 % (ref 11.5–15.5)
WBC: 11.8 10*3/uL — ABNORMAL HIGH (ref 4.0–10.5)
nRBC: 0 % (ref 0.0–0.2)

## 2019-07-05 MED ORDER — PRAVASTATIN SODIUM 10 MG PO TABS
20.0000 mg | ORAL_TABLET | Freq: Every day | ORAL | Status: DC
  Administered 2019-07-06 – 2019-07-09 (×4): 20 mg via ORAL
  Filled 2019-07-05 (×5): qty 2

## 2019-07-05 MED ORDER — CIPROFLOXACIN IN D5W 400 MG/200ML IV SOLN
400.0000 mg | Freq: Two times a day (BID) | INTRAVENOUS | Status: DC
  Administered 2019-07-05 – 2019-07-06 (×3): 400 mg via INTRAVENOUS
  Filled 2019-07-05 (×4): qty 200

## 2019-07-05 MED ORDER — MORPHINE SULFATE (PF) 2 MG/ML IV SOLN
2.0000 mg | INTRAVENOUS | Status: DC | PRN
  Administered 2019-07-05 – 2019-07-08 (×5): 2 mg via INTRAVENOUS
  Filled 2019-07-05 (×5): qty 1

## 2019-07-05 MED ORDER — MORPHINE SULFATE (PF) 2 MG/ML IV SOLN
2.0000 mg | Freq: Once | INTRAVENOUS | Status: AC
  Administered 2019-07-05: 2 mg via INTRAVENOUS
  Filled 2019-07-05: qty 1

## 2019-07-05 MED ORDER — ENOXAPARIN SODIUM 60 MG/0.6ML ~~LOC~~ SOLN
60.0000 mg | SUBCUTANEOUS | Status: DC
  Administered 2019-07-05 – 2019-07-09 (×5): 60 mg via SUBCUTANEOUS
  Filled 2019-07-05 (×5): qty 0.6

## 2019-07-05 MED ORDER — ACETAMINOPHEN 650 MG RE SUPP: 650.0000 mg | Freq: Four times a day (QID) | RECTAL | Status: DC | PRN

## 2019-07-05 MED ORDER — SODIUM CHLORIDE 0.9 % IV SOLN: INTRAVENOUS | Status: DC

## 2019-07-05 MED ORDER — ONDANSETRON HCL 4 MG/2ML IJ SOLN
4.0000 mg | Freq: Four times a day (QID) | INTRAMUSCULAR | Status: DC | PRN
  Administered 2019-07-05: 4 mg via INTRAVENOUS
  Filled 2019-07-05: qty 2

## 2019-07-05 MED ORDER — METRONIDAZOLE IN NACL 5-0.79 MG/ML-% IV SOLN
500.0000 mg | Freq: Three times a day (TID) | INTRAVENOUS | Status: DC
  Administered 2019-07-05 – 2019-07-06 (×5): 500 mg via INTRAVENOUS
  Filled 2019-07-05 (×6): qty 100

## 2019-07-05 MED ORDER — VENLAFAXINE HCL 37.5 MG PO TABS
37.5000 mg | ORAL_TABLET | Freq: Every day | ORAL | Status: DC
  Administered 2019-07-07 – 2019-07-10 (×4): 37.5 mg via ORAL
  Filled 2019-07-05 (×6): qty 1

## 2019-07-05 MED ORDER — ASPIRIN EC 81 MG PO TBEC
81.0000 mg | DELAYED_RELEASE_TABLET | Freq: Every day | ORAL | Status: DC
  Administered 2019-07-07 – 2019-07-10 (×4): 81 mg via ORAL
  Filled 2019-07-05 (×6): qty 1

## 2019-07-05 MED ORDER — CLOPIDOGREL BISULFATE 75 MG PO TABS
75.0000 mg | ORAL_TABLET | Freq: Every day | ORAL | Status: DC
  Filled 2019-07-05: qty 1

## 2019-07-05 MED ORDER — ACETAMINOPHEN 325 MG PO TABS
650.0000 mg | ORAL_TABLET | Freq: Four times a day (QID) | ORAL | Status: DC | PRN
  Administered 2019-07-07 – 2019-07-10 (×6): 650 mg via ORAL
  Filled 2019-07-05 (×6): qty 2

## 2019-07-05 MED ORDER — GABAPENTIN 300 MG PO CAPS
300.0000 mg | ORAL_CAPSULE | Freq: Three times a day (TID) | ORAL | Status: DC
  Administered 2019-07-06 – 2019-07-10 (×12): 300 mg via ORAL
  Filled 2019-07-05 (×13): qty 1

## 2019-07-05 MED ORDER — HYDRALAZINE HCL 20 MG/ML IJ SOLN: 10.0000 mg | INTRAMUSCULAR | Status: DC | PRN

## 2019-07-05 NOTE — ED Notes (Addendum)
Pt c/o pain and nausea- Dr Sharl Ma made aware

## 2019-07-05 NOTE — ED Notes (Signed)
Dr Sharl Ma at bedside Pt called daughter, Huntley Dec, with this nurse at bedside to answer any questions. This nurse spoke with daughter to inform that pt will be admitted and has diverticulitis, which will be treated with antibioitics.

## 2019-07-05 NOTE — Consult Note (Addendum)
Reason for Consult:pneumobilia, partial small bowel obstruction  Referring Physician: Dr. Sarita Haver is an 78 y.o. female.  HPI: Patient is a 79 year old white female with multiple medical problems who presented with a history of nausea, vomiting, and abdominal pain yesterday.  A CT scan in the emergency room revealed initial finding of pneumobilia as well as small bowel enteritis consistent with probable ileus.  She was noted to have only mildly elevated leukocytosis and normal liver enzyme test.  Her past medical history is significant for what sounds like placement of cholecystostomy tube x2 in the past in [place].  She states she had been doing well until recently when she started having the nausea and abdominal symptoms.  This morning, she states she still has mild abdominal discomfort but no nausea.  She denies any fever or chills.  Past Medical History:  Diagnosis Date  . Anemia   . Anxiety   . Arthritis   . Cholecystitis   . Chronic diastolic CHF (congestive heart failure), NYHA class 2 ([place])   . Complication of anesthesia    very slow to wake up  . Depression   . History of cellulitis   . History of CVA (cerebrovascular accident)   . History of endocarditis   . Hyperlipemia   . Hypertension   . Kidney lesion, native, right    on pre TAVR CT scan. will require follow up wtih a MRI  . Morbid obesity ([place])   . Neuropathy   . Physical deconditioning   . Right bundle branch block (RBBB)   . 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 Severe aortic stenosis   . Sleep apnea   . Tobacco abuse     Past Surgical History:  Procedure Laterality Date  . Abdominal gangrene    . ADENOIDECTOMY    . Cataract surgery    . COLONOSCOPY    . ENDOSCOPIC RETROGRADE CHOLANGIOPANCREATOGRAPHY (ERCP) WITH PROPOFOL N/A 02/08/2018    Procedure: ENDOSCOPIC RETROGRADE CHOLANGIOPANCREATOGRAPHY (ERCP) WITH PROPOFOL;  Surgeon: Rush Landmark Telford Nab., MD;  Location: [facility];  Service: Gastroenterology;  Laterality: N/A;  . EUS  02/08/2018   Procedure: UPPER ENDOSCOPIC ULTRASOUND (EUS) LINEAR;  Surgeon: Irving Copas., MD;  Location: [facility];  Service: Gastroenterology;;  . EYE SURGERY    . IR FLUORO RM 30-60 MIN  03/27/2018  . IR PERC CHOLECYSTOSTOMY  02/09/2018  . IR RADIOLOGIST EVAL & MGMT  03/26/2018  . IR RADIOLOGY PERIPHERAL GUIDED IV START  05/22/2018  . IR RADIOLOGY PERIPHERAL GUIDED IV START  05/30/2018  . IR RADIOLOGY PERIPHERAL GUIDED IV START  06/05/2018  . IR US GUIDE VASC ACCESS RIGHT  05/22/2018  . IR US GUIDE VASC ACCESS RIGHT  05/30/2018  . IR US GUIDE VASC ACCESS RIGHT  06/05/2018  . PACEMAKER IMPLANT N/A 07/10/2018   Procedure: PACEMAKER IMPLANT;  Surgeon: Evans Lance, MD;  Location: [facility];  Service: Cardiovascular;  Laterality: N/A;  . REMOVAL OF STONES  02/08/2018   Procedure: REMOVAL OF STONES;  Surgeon: Irving Copas., MD;  Location: [facility];  Service: Gastroenterology;;  . Joan Mayans  02/08/2018   Procedure: Joan Mayans;  Surgeon: Mansouraty, Telford Nab., MD;  Location: [facility];  Service: Gastroenterology;;  . TONSILLECTOMY    . TOOTH EXTRACTION N/A 06/18/2018   Procedure: DENTAL RESTORATION/EXTRACTIONS;  Surgeon: Diona Browner, DDS;  Location: [facility]  OR;  Service: Oral Surgery;  Laterality: N/A;  . TRANSCATHETER AORTIC VALVE REPLACEMENT, TRANSFEMORAL N/A 07/09/2018   Procedure: TRANSCATHETER AORTIC VALVE REPLACEMENT, TRANSFEMORAL;  Surgeon: Sherren Mocha, MD;  Location: [facility];  Service: Cardiovascular;  Laterality: N/A;  . Tummy tuck      Family History  Problem Relation Age of Onset  . Cancer Mother   . Hypertension Father   . Arthritis Father   . Heart attack Father     Social History:  reports that she quit smoking about 41  years ago. Her smoking use included cigarettes. She has never used smokeless tobacco. She reports previous alcohol use. She reports that she does not use drugs.  Allergies: No Known Allergies  Medications:  I have reviewed the patient's current medications. Scheduled: . aspirin EC  81 mg Oral Daily  . clopidogrel  75 mg Oral Daily  . enoxaparin (LOVENOX) injection  60 mg Subcutaneous Q24H  . gabapentin  300 mg Oral TID  . pravastatin  20 mg Oral QHS  . venlafaxine  37.5 mg Oral Daily    Results for orders placed or performed during the hospital encounter of 07/04/19 (from the past 48 hour(s))  POC SARS Coronavirus 2 Ag-ED - Nasal Swab (BD Veritor Kit)     Status: None   Collection Time: 07/04/19  7:46 PM  Result Value Ref Range   SARS Coronavirus 2 Ag NEGATIVE NEGATIVE    Comment: (NOTE) SARS-CoV-2 antigen NOT DETECTED.  Negative results are presumptive.  Negative results do not preclude SARS-CoV-2 infection and should not be used as the sole basis for treatment or other patient management decisions, including infection  control decisions, particularly in the presence of clinical signs and  symptoms consistent with COVID-19, or in those who have been in contact with the virus.  Negative results must be combined with clinical observations, patient history, and epidemiological information. The expected result is Negative. Fact Sheet for Patients: PodPark.tn Fact Sheet for Healthcare Providers: GiftContent.is This test is not yet approved or cleared by the [place] FDA and  has been authorized for detection and/or diagnosis of SARS-CoV-2 by FDA under an Emergency Use Authorization (EUA).  This EUA will remain in effect (meaning this test can be used) for the duration of  the COVID-19 de claration under Section 564(b)(1) of the Act, 21 U.S.C. section 360bbb-3(b)(1), unless the authorization is terminated or revoked  sooner.   CBC with Differential     Status: Abnormal   Collection Time: 07/04/19  8:00 PM  Result Value Ref Range   WBC 12.9 (H) 4.0 - 10.5 K/uL   RBC 4.73 3.87 - 5.11 MIL/uL   Hemoglobin 14.0 12.0 - 15.0 g/dL   HCT 45.3 36.0 - 46.0 %   MCV 95.8 80.0 - 100.0 fL   MCH 29.6 26.0 - 34.0 pg   MCHC 30.9 30.0 - 36.0 g/dL   RDW 15.6 (H) 11.5 - 15.5 %   Platelets 265 150 - 400 K/uL   nRBC 0.0 0.0 - 0.2 %   Neutrophils Relative % 80 %   Neutro Abs 10.3 (H) 1.7 - 7.7 K/uL   Lymphocytes Relative 12 %   Lymphs Abs 1.6 0.7 - 4.0 K/uL   Monocytes Relative 7 %   Monocytes Absolute 0.9 0.1 - 1.0 K/uL   Eosinophils Relative 1 %   Eosinophils Absolute 0.1 0.0 - 0.5 K/uL   Basophils Relative 0 %   Basophils Absolute 0.0 0.0 - 0.1 K/uL  Immature Granulocytes 0 %   Abs Immature Granulocytes 0.05 0.00 - 0.07 K/uL    Comment: Performed at [facility], [address] [place], [place] [address]  Comprehensive metabolic panel     Status: Abnormal   Collection Time: 07/04/19  8:00 PM  Result Value Ref Range   Sodium 138 135 - 145 mmol/L   Potassium 4.4 3.5 - 5.1 mmol/L   Chloride 96 (L) 98 - 111 mmol/L   CO2 34 (H) 22 - 32 mmol/L   Glucose, Bld 115 (H) 70 - 99 mg/dL    Comment: Glucose reference range applies only to samples taken after fasting for at least 8 hours.   BUN 27 (H) 8 - 23 mg/dL   Creatinine, Ser 0.81 0.44 - 1.00 mg/dL   Calcium 9.4 8.9 - 10.3 mg/dL   Total Protein 7.4 6.5 - 8.1 g/dL   Albumin 3.6 3.5 - 5.0 g/dL   AST 18 15 - 41 U/L   ALT 11 0 - 44 U/L   Alkaline Phosphatase 69 38 - 126 U/L   Total Bilirubin 0.5 0.3 - 1.2 mg/dL   GFR calc non Af Amer >60 >60 mL/min   GFR calc Af Amer >60 >60 mL/min   Anion gap 8 5 - 15    Comment: Performed at [facility], [address] [place], [place] [address]  Lipase, blood     Status: None   Collection Time: 07/04/19  8:00 PM  Result Value Ref Range   Lipase 14 11 - 51 U/L    Comment: Performed at [facility], [address]  [place], [place] [address]  Respiratory Panel by RT PCR (Flu A&B, Covid) - Nasopharyngeal Swab     Status: None   Collection Time: 07/04/19  9:45 PM   Specimen: Nasopharyngeal Swab  Result Value Ref Range   SARS Coronavirus 2 by RT PCR NEGATIVE NEGATIVE    Comment: (NOTE) SARS-CoV-2 target nucleic acids are NOT DETECTED. The SARS-CoV-2 RNA is generally detectable in upper respiratoy specimens during the acute phase of infection. The lowest concentration of SARS-CoV-2 viral copies this assay can detect is 131 copies/mL. A negative result does not preclude SARS-Cov-2 infection and should not be used as the sole basis for treatment or other patient management decisions. A negative result may occur with  improper specimen collection/handling, submission of specimen other than nasopharyngeal swab, presence of viral mutation(s) within the areas targeted by this assay, and inadequate number of viral copies (<131 copies/mL). A negative result must be combined with clinical observations, patient history, and epidemiological information. The expected result is Negative. Fact Sheet for Patients:  PinkCheek.be Fact Sheet for Healthcare Providers:  GravelBags.it This test is not yet ap proved or cleared by the [place] FDA and  has been authorized for detection and/or diagnosis of SARS-CoV-2 by FDA under an Emergency Use Authorization (EUA). This EUA will remain  in effect (meaning this test can be used) for the duration of the COVID-19 declaration under Section 564(b)(1) of the Act, 21 U.S.C. section 360bbb-3(b)(1), unless the authorization is terminated or revoked sooner.    Influenza A by PCR NEGATIVE NEGATIVE   Influenza B by PCR NEGATIVE NEGATIVE    Comment: (NOTE) The Xpert Xpress SARS-CoV-2/FLU/RSV assay is intended as an aid in  the diagnosis of influenza from Nasopharyngeal swab specimens and  should not be used as a sole basis  for treatment. Nasal washings and  aspirates are unacceptable for Xpert Xpress SARS-CoV-2/FLU/RSV  testing. Fact  Sheet for Patients: PinkCheek.be Fact Sheet for Healthcare Providers: GravelBags.it This test is not yet approved or cleared by the [place] FDA and  has been authorized for detection and/or diagnosis of SARS-CoV-2 by  FDA under an Emergency Use Authorization (EUA). This EUA will remain  in effect (meaning this test can be used) for the duration of the  Covid-19 declaration under Section 564(b)(1) of the Act, 21  U.S.C. section 360bbb-3(b)(1), unless the authorization is  terminated or revoked. Performed at [facility], [address], [place], [place] [address]   CBC     Status: Abnormal   Collection Time: 07/05/19  7:09 AM  Result Value Ref Range   WBC 11.8 (H) 4.0 - 10.5 K/uL   RBC 4.41 3.87 - 5.11 MIL/uL   Hemoglobin 13.0 12.0 - 15.0 g/dL   HCT 42.4 36.0 - 46.0 %   MCV 96.1 80.0 - 100.0 fL   MCH 29.5 26.0 - 34.0 pg   MCHC 30.7 30.0 - 36.0 g/dL   RDW 15.3 11.5 - 15.5 %   Platelets 272 150 - 400 K/uL   nRBC 0.0 0.0 - 0.2 %    Comment: Performed at [facility], [address] [place], [place] [address]  Comprehensive metabolic panel     Status: Abnormal   Collection Time: 07/05/19  7:09 AM  Result Value Ref Range   Sodium 138 135 - 145 mmol/L   Potassium 4.1 3.5 - 5.1 mmol/L   Chloride 98 98 - 111 mmol/L   CO2 32 22 - 32 mmol/L   Glucose, Bld 109 (H) 70 - 99 mg/dL    Comment: Glucose reference range applies only to samples taken after fasting for at least 8 hours.   BUN 24 (H) 8 - 23 mg/dL   Creatinine, Ser 0.68 0.44 - 1.00 mg/dL   Calcium 8.9 8.9 - 10.3 mg/dL   Total Protein 6.5 6.5 - 8.1 g/dL   Albumin 3.1 (L) 3.5 - 5.0 g/dL   AST 16 15 - 41 U/L   ALT 10 0 - 44 U/L   Alkaline Phosphatase 64 38 - 126 U/L   Total Bilirubin 0.8 0.3 - 1.2 mg/dL   GFR calc non Af Amer >60 >60 mL/min   GFR calc Af  Amer >60 >60 mL/min   Anion gap 8 5 - 15    Comment: Performed at [facility], [address], [place], [place] [address]    CT Abdomen Pelvis W Contrast  Result Date: 07/04/2019 CLINICAL DATA:  Nausea and vomiting for 2 days. EXAM: CT ABDOMEN AND PELVIS WITH CONTRAST TECHNIQUE: Multidetector CT imaging of the abdomen and pelvis was performed using the standard protocol following bolus administration of intravenous contrast. CONTRAST:  167m OMNIPAQUE IOHEXOL 300 MG/ML  SOLN COMPARISON:  February 09, 2018 FINDINGS: Lower chest: No acute abnormality. Hepatobiliary: No focal liver abnormality is seen. There is moderate severity central intrahepatic biliary dilatation with dilatation of the common bile duct. A moderate amount of intrahepatic biliary air is seen which extends into the common bile duct. Gallstones and a moderate amount of heterogeneous attenuation is seen within the gallbladder lumen. A mild-to-moderate amount of air is also seen within the gallbladder. Pancreas: Unremarkable. No pancreatic ductal dilatation or surrounding inflammatory changes. Spleen: Normal in size without focal abnormality. Adrenals/Urinary Tract: Adrenal glands are unremarkable. Kidneys are normal in size, without renal calculi or hydronephrosis. A 1.6 cm simple cyst is seen within the lateral aspect of the mid right kidney. A 2.0  cm exophytic cyst is seen along the medial aspect of the mid left kidney. Bladder is unremarkable. Stomach/Bowel: Stomach is within normal limits. Appendix appears normal. Multiple dilated loops of fluid-filled small bowel are seen throughout the abdomen and pelvis (maximum small bowel diameter of approximately 4.0 cm). Mild Peri intestinal inflammatory fat stranding is seen throughout the mesentery. Vascular/Lymphatic: Moderate to marked severity aortic tortuosity and atherosclerosis. No enlarged abdominal or pelvic lymph nodes. Reproductive: Uterus and bilateral adnexa are unremarkable. Other: No  abdominal wall hernia or abnormality. No abdominopelvic ascites. Musculoskeletal: Degenerative changes are seen throughout the lumbar spine. IMPRESSION: 1. Multiple dilated loops of fluid-filled small bowel throughout the abdomen and pelvis, with associated mild peri intestinal inflammatory fat stranding. This is consistent with an infectious or inflammatory enteritis and subsequent ileus. 2. Cholelithiasis, moderate severity central intrahepatic biliary dilatation and common bile duct dilatation with a moderate amount of pneumobilia and air within the gallbladder lumen. This is consistent with sequelae associated with emphysematous cholecystitis. Aortic Atherosclerosis (ICD10-I70.0). Electronically Signed   By: Virgina Norfolk M.D.   On: 07/04/2019 23:13   DG Chest Portable 1 View  Result Date: 07/04/2019 CLINICAL DATA:  Shortness of breath. Nausea and vomiting for 2 days. EXAM: PORTABLE CHEST 1 VIEW COMPARISON:  Radiograph 07/11/2018 FINDINGS: Lower most aspect of the chest not included in the field of view. Left-sided pacemaker remains in place. Stable cardiomegaly with transcatheter aortic valve replacement. No focal airspace disease or pulmonary edema. No large pleural effusion. No pneumothorax. Degenerative change of both shoulders. No acute osseous abnormalities are seen. IMPRESSION: Stable cardiomegaly. No acute abnormality. Electronically Signed   By: Keith Rake M.D.   On: 07/04/2019 19:04    ROS:  Pertinent items are noted in HPI.  Blood pressure (!) 162/79, pulse 83, temperature 98.2 F (36.8 C), temperature source Oral, resp. rate 18, height _0  (1.676 m), weight 121.6 kg, SpO2 99 %. Physical Exam: Pleasant obese white female in no acute distress Head is normocephalic, atraumatic Lungs are clear to auscultation with good breath sounds bilaterally Heart examination reveals a 2 out of 6 systolic ejection murmur. Abdomen is soft.  Umbilicus is displaced secondary to previous  panniculectomy for gangrene.  No rigidity is noted.  Examination limited secondary to body habitus.  Previous admissions and office visits in recent past reviewed.  CT scan of the abdomen images personally reviewed.  I did review this with radiology and she does have a gallstone in the distal small bowel.  Assessment/Plan: Impression: Gallstone ileus.  A fistulous tract from the gallbladder to the small bowel good explain the findings of a partial bowel obstruction and pneumobilia.  Discussed with Dr. Roderic Palau. Plan: Patient will probably need exploratory laparotomy with extraction of the gallstone and possible partial small bowel resection.  We will assess her cardiac status to see whether she can tolerate surgical intervention.  Given her history of cholecystitis, cholelithiasis, and cholecystostomy tube intervention, I would not try to remove her gallbladder at this time as that surgery could be more extensive given the acute inflammatory nature of the current process.  Ultrasound is pending.  Will need cardiac clearance as she was supposed to get follow up Echo 07/24/19.  Aviva Signs 07/05/2019, 10:51 AM

## 2019-07-05 NOTE — H&P (Addendum)
TRH H&P    Patient Demographics:    Dawn Gonzales, is a 79 y.o. female  MRN: [id number]  DOB - 1940/11/09  Admit Date - 07/04/2019  Referring MD/NP/PA: Calvert Cantor  Outpatient Primary MD for the patient is [name], MD  Patient coming from: Home  Chief complaint-abdominal pain   HPI:    Dawn Gonzales  is a 79 y.o. female, with history of aortic stenosis, s/p TAVR, mitral valve endocarditis treated medically, chronic diastolic CHF, hyperlipidemia, OSA, cholelithiasis with 2 previous episodes of choledocholithiasis associated with sepsis treated with ERCP, cephalic vein thrombosis in 2018 who came to hospital with complaints of nausea and vomiting with lower abdominal pain since yesterday.  Patient denies any fever or chills.  Denies chest pain or shortness of breath.  Denies dysuria.  She had large loose BM yesterday. In the ED CT scan of the abdomen pelvis showed multiple dilated loops of fluid-filled small bowel throughout the abdomen and pelvis with associated mild periintestinal inflammatory fat stranding.  Consistent with infectious or inflammatory enteritis and subsequent ileus. Also showed cholelithiasis, moderate severity central intrahepatic biliary dilatation and common bile duct dilatation with moderate amount of pneumobilia and air within the gallbladder lumen.  Consistent with sequelae associated with emphysematous cholecystitis. General surgery was consulted by ED physician, Dr. Arnoldo Morale recommended IV antibiotics at this time and will see patient in a.m.    Review of systems:    In addition to the HPI above,   All other systems reviewed and are negative.    Past History of the following :    Past Medical History:  Diagnosis Date  . Anemia   . Anxiety   . Arthritis   . Cholecystitis   . Chronic diastolic CHF (congestive heart failure), NYHA class 2 ([place])   .  Complication of anesthesia    very slow to wake up  . Depression   . History of cellulitis   . History of CVA (cerebrovascular accident)   . History of endocarditis   . Hyperlipemia   . Hypertension   . Kidney lesion, native, right    on pre TAVR CT scan. will require follow up wtih a MRI  . Morbid obesity ([place])   . Neuropathy   . Physical deconditioning   . Right bundle branch block (RBBB)   . 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 Severe aortic stenosis   . Sleep apnea   . Tobacco abuse       Past Surgical History:  Procedure Laterality Date  . Abdominal gangrene    . ADENOIDECTOMY    . Cataract surgery    . COLONOSCOPY    . ENDOSCOPIC RETROGRADE CHOLANGIOPANCREATOGRAPHY (ERCP) WITH PROPOFOL N/A 02/08/2018   Procedure: ENDOSCOPIC RETROGRADE CHOLANGIOPANCREATOGRAPHY (ERCP) WITH PROPOFOL;  Surgeon: Rush Landmark Telford Nab., MD;  Location: [facility];  Service: Gastroenterology;  Laterality: N/A;  . EUS  02/08/2018   Procedure: UPPER ENDOSCOPIC ULTRASOUND (EUS) LINEAR;  Surgeon: Irving Copas., MD;  Location: [facility];  Service: Gastroenterology;;  . EYE SURGERY    . IR FLUORO RM 30-60 MIN  03/27/2018  . IR PERC CHOLECYSTOSTOMY  02/09/2018  . IR RADIOLOGIST EVAL & MGMT  03/26/2018  . IR RADIOLOGY PERIPHERAL GUIDED IV START  05/22/2018  . IR RADIOLOGY PERIPHERAL GUIDED IV START  05/30/2018  . IR RADIOLOGY PERIPHERAL GUIDED IV START  06/05/2018  . IR US GUIDE VASC ACCESS RIGHT  05/22/2018  . IR US GUIDE VASC ACCESS RIGHT  05/30/2018  . IR US GUIDE VASC ACCESS RIGHT  06/05/2018  . PACEMAKER IMPLANT N/A 07/10/2018   Procedure: PACEMAKER IMPLANT;  Surgeon: [place] Lance, MD;  Location: [facility];  Service: Cardiovascular;  Laterality: N/A;  . REMOVAL OF STONES  02/08/2018   Procedure: REMOVAL OF STONES;  Surgeon: Irving Copas.,  MD;  Location: [facility];  Service: Gastroenterology;;  . Joan Mayans  02/08/2018   Procedure: Joan Mayans;  Surgeon: Mansouraty, Telford Nab., MD;  Location: [facility];  Service: Gastroenterology;;  . TONSILLECTOMY    . TOOTH EXTRACTION N/A 06/18/2018   Procedure: DENTAL RESTORATION/EXTRACTIONS;  Surgeon: Diona Browner, DDS;  Location: [facility];  Service: Oral Surgery;  Laterality: N/A;  . TRANSCATHETER AORTIC VALVE REPLACEMENT, TRANSFEMORAL N/A 07/09/2018   Procedure: TRANSCATHETER AORTIC VALVE REPLACEMENT, TRANSFEMORAL;  Surgeon: Sherren Mocha, MD;  Location: [facility];  Service: Cardiovascular;  Laterality: N/A;  . Tummy tuck        Social History:      Social History   Tobacco Use  . Smoking status: Former Smoker    Types: Cigarettes    Quit date: 05/23/1978    Years since quitting: 41.1  . Smokeless tobacco: Never Used  . Tobacco comment: only 1 pack pewr week   Substance Use Topics  . Alcohol use: Not Currently    Comment: Occasional       Family History :     Family History  Problem Relation Age of Onset  . Cancer Mother   . Hypertension Father   . Arthritis Father   . Heart attack Father       Home Medications:   Prior to Admission medications   Medication Sig Start Date End Date Taking? Authorizing Provider  acetaminophen (TYLENOL) 650 MG CR tablet Take 1,300 mg by mouth every 8 (eight) hours as needed for pain.    [provider]  aspirin EC 81 MG tablet Take 81 mg by mouth daily.    [provider]  Calcium Carbonate (CALCIUM-CARB 600 PO) Take 600 mg by mouth daily.    [provider]  cholecalciferol (VITAMIN D) 1000 units tablet Take 1,000 Units by mouth daily.    [provider]  clopidogrel (PLAVIX) 75 MG tablet TAKE 1 TABLET BY MOUTH EVERY DAY 12/23/18   Eileen Stanford, PA-C  ferrous sulfate 325 (65 FE) MG tablet Take 325 mg by mouth daily with breakfast.    [provider]    gabapentin (NEURONTIN) 300 MG capsule Take 300 mg by mouth 3 (three) times daily.    [provider]  Multiple Vitamin (MULTIVITAMIN WITH MINERALS) TABS tablet Take 1 tablet by mouth daily.    [provider]  niacin 500 MG tablet Take 500 mg by mouth daily.    [provider]  nystatin ointment (MYCOSTATIN) Apply 1 application topically 2 (two) times daily as needed (rash).  01/26/18   [provider]  pravastatin (PRAVACHOL)  20 MG tablet Take 20 mg by mouth at bedtime.     [provider]  traMADol (ULTRAM) 50 MG tablet Take 50 mg by mouth every 6 (six) hours as needed for moderate pain.    [provider]  venlafaxine (EFFEXOR) 37.5 MG tablet Take 37.5 mg by mouth daily.    [provider]  vitamin C (ASCORBIC ACID) 500 MG tablet Take 500 mg by mouth daily.    [provider]  ZINC OXIDE EX Apply 1 application topically daily as needed (wound care).    [provider]     Allergies:    No Known Allergies   Physical Exam:   Vitals  Blood pressure (!) 174/79, pulse 88, temperature 98.6 F (37 C), temperature source Oral, resp. rate (!) 22, height '5\' 6"'  (1.676 m), weight 121.6 kg, SpO2 96 %.  1.  General: Appears in no acute distress  2. Psychiatric: Alert, oriented x3, intact insight and judgment  3. Neurologic: Cranial nerves II through XII grossly intact, no focal deficit noted  4. HEENMT:  Atraumatic normocephalic, extraocular muscles are intact  5. Respiratory : Clear to auscultation bilaterally, no wheezing or crackles auscultated  6. Cardiovascular : S1-S2, regular, no murmur auscultated  7. Gastrointestinal:  Abdomen is soft, mild generalized tenderness to palpation, no rigidity or guarding noted      Data Review:    CBC Recent Labs  Lab 07/04/19 2000  WBC 12.9*  HGB 14.0  HCT 45.3  PLT 265  MCV 95.8  MCH 29.6  MCHC 30.9  RDW 15.6*  LYMPHSABS 1.6  MONOABS 0.9  EOSABS  0.1  BASOSABS 0.0   ------------------------------------------------------------------------------------------------------------------  Results for orders placed or performed during the hospital encounter of 07/04/19 (from the past 48 hour(s))  POC SARS Coronavirus 2 Ag-ED - Nasal Swab (BD Veritor Kit)     Status: None   Collection Time: 07/04/19  7:46 PM  Result Value Ref Range   SARS Coronavirus 2 Ag NEGATIVE NEGATIVE    Comment: (NOTE) SARS-CoV-2 antigen NOT DETECTED.  Negative results are presumptive.  Negative results do not preclude SARS-CoV-2 infection and should not be used as the sole basis for treatment or other patient management decisions, including infection  control decisions, particularly in the presence of clinical signs and  symptoms consistent with COVID-19, or in those who have been in contact with the virus.  Negative results must be combined with clinical observations, patient history, and epidemiological information. The expected result is Negative. Fact Sheet for Patients: PodPark.tn Fact Sheet for Healthcare Providers: GiftContent.is This test is not yet approved or cleared by the [place] FDA and  has been authorized for detection and/or diagnosis of SARS-CoV-2 by FDA under an Emergency Use Authorization (EUA).  This EUA will remain in effect (meaning this test can be used) for the duration of  the COVID-19 de claration under Section 564(b)(1) of the Act, 21 U.S.C. section 360bbb-3(b)(1), unless the authorization is terminated or revoked sooner.   CBC with Differential     Status: Abnormal   Collection Time: 07/04/19  8:00 PM  Result Value Ref Range   WBC 12.9 (H) 4.0 - 10.5 K/uL   RBC 4.73 3.87 - 5.11 MIL/uL   Hemoglobin 14.0 12.0 - 15.0 g/dL   HCT 45.3 36.0 - 46.0 %   MCV 95.8 80.0 - 100.0 fL   MCH 29.6 26.0 - 34.0 pg   MCHC 30.9 30.0 - 36.0 g/dL   RDW 15.6 (H) 11.5 -  15.5 %   Platelets  265 150 - 400 K/uL   nRBC 0.0 0.0 - 0.2 %   Neutrophils Relative % 80 %   Neutro Abs 10.3 (H) 1.7 - 7.7 K/uL   Lymphocytes Relative 12 %   Lymphs Abs 1.6 0.7 - 4.0 K/uL   Monocytes Relative 7 %   Monocytes Absolute 0.9 0.1 - 1.0 K/uL   Eosinophils Relative 1 %   Eosinophils Absolute 0.1 0.0 - 0.5 K/uL   Basophils Relative 0 %   Basophils Absolute 0.0 0.0 - 0.1 K/uL   Immature Granulocytes 0 %   Abs Immature Granulocytes 0.05 0.00 - 0.07 K/uL    Comment: Performed at [facility], [address] [place], [place] [address]  Comprehensive metabolic panel     Status: Abnormal   Collection Time: 07/04/19  8:00 PM  Result Value Ref Range   Sodium 138 135 - 145 mmol/L   Potassium 4.4 3.5 - 5.1 mmol/L   Chloride 96 (L) 98 - 111 mmol/L   CO2 34 (H) 22 - 32 mmol/L   Glucose, Bld 115 (H) 70 - 99 mg/dL    Comment: Glucose reference range applies only to samples taken after fasting for at least 8 hours.   BUN 27 (H) 8 - 23 mg/dL   Creatinine, Ser 0.81 0.44 - 1.00 mg/dL   Calcium 9.4 8.9 - 10.3 mg/dL   Total Protein 7.4 6.5 - 8.1 g/dL   Albumin 3.6 3.5 - 5.0 g/dL   AST 18 15 - 41 U/L   ALT 11 0 - 44 U/L   Alkaline Phosphatase 69 38 - 126 U/L   Total Bilirubin 0.5 0.3 - 1.2 mg/dL   GFR calc non Af Amer >60 >60 mL/min   GFR calc Af Amer >60 >60 mL/min   Anion gap 8 5 - 15    Comment: Performed at [facility], [address], [place], [place] [address]  Lipase, blood     Status: None   Collection Time: 07/04/19  8:00 PM  Result Value Ref Range   Lipase 14 11 - 51 U/L    Comment: Performed at [facility], [address], [place],  [address]  Respiratory Panel by RT PCR (Flu A&B, Covid) - Nasopharyngeal Swab     Status: None   Collection Time: 07/04/19  9:45 PM   Specimen: Nasopharyngeal Swab  Result Value Ref Range   SARS Coronavirus 2 by RT PCR NEGATIVE NEGATIVE    Comment: (NOTE) SARS-CoV-2 target nucleic acids are NOT DETECTED. The SARS-CoV-2 RNA is generally detectable in  upper respiratoy specimens during the acute phase of infection. The lowest concentration of SARS-CoV-2 viral copies this assay can detect is 131 copies/mL. A negative result does not preclude SARS-Cov-2 infection and should not be used as the sole basis for treatment or other patient management decisions. A negative result may occur with  improper specimen collection/handling, submission of specimen other than nasopharyngeal swab, presence of viral mutation(s) within the areas targeted by this assay, and inadequate number of viral copies (<131 copies/mL). A negative result must be combined with clinical observations, patient history, and epidemiological information. The expected result is Negative. Fact Sheet for Patients:  PinkCheek.be Fact Sheet for Healthcare Providers:  GravelBags.it This test is not yet ap proved or cleared by the [place] FDA and  has been authorized for detection and/or diagnosis of SARS-CoV-2 by FDA under an Emergency Use Authorization (EUA). This EUA will remain  in effect (meaning this  test can be used) for the duration of the COVID-19 declaration under Section 564(b)(1) of the Act, 21 U.S.C. section 360bbb-3(b)(1), unless the authorization is terminated or revoked sooner.    Influenza A by PCR NEGATIVE NEGATIVE   Influenza B by PCR NEGATIVE NEGATIVE    Comment: (NOTE) The Xpert Xpress SARS-CoV-2/FLU/RSV assay is intended as an aid in  the diagnosis of influenza from Nasopharyngeal swab specimens and  should not be used as a sole basis for treatment. Nasal washings and  aspirates are unacceptable for Xpert Xpress SARS-CoV-2/FLU/RSV  testing. Fact Sheet for Patients: PinkCheek.be Fact Sheet for Healthcare Providers: GravelBags.it This test is not yet approved or cleared by the [place] FDA and  has been authorized for detection  and/or diagnosis of SARS-CoV-2 by  FDA under an Emergency Use Authorization (EUA). This EUA will remain  in effect (meaning this test can be used) for the duration of the  Covid-19 declaration under Section 564(b)(1) of the Act, 21  U.S.C. section 360bbb-3(b)(1), unless the authorization is  terminated or revoked. Performed at [facility], [address] [place], [place] [address]     Chemistries  Recent Labs  Lab 07/04/19 2000  NA 138  K 4.4  CL 96*  CO2 34*  GLUCOSE 115*  BUN 27*  CREATININE 0.81  CALCIUM 9.4  AST 18  ALT 11  ALKPHOS 69  BILITOT 0.5   ------------------------------------------------------------------------------------------------------------------  ------------------------------------------------------------------------------------------------------------------ GFR: Estimated Creatinine Clearance: 76.1 mL/min (by C-G formula based on SCr of 0.81 mg/dL). Liver Function Tests: Recent Labs  Lab 07/04/19 2000  AST 18  ALT 11  ALKPHOS 69  BILITOT 0.5  PROT 7.4  ALBUMIN 3.6   Recent Labs  Lab 07/04/19 2000  LIPASE 14    --------------------------------------------------------------------------------------------------------------- Urine analysis:    Component Value Date/Time   COLORURINE YELLOW 07/05/2018 1021   APPEARANCEUR CLEAR 07/05/2018 1021   LABSPEC 1.018 07/05/2018 1021   PHURINE 6.0 07/05/2018 1021   GLUCOSEU NEGATIVE 07/05/2018 1021   HGBUR NEGATIVE 07/05/2018 1021   BILIRUBINUR NEGATIVE 07/05/2018 1021   KETONESUR NEGATIVE 07/05/2018 1021   PROTEINUR NEGATIVE 07/05/2018 1021   NITRITE NEGATIVE 07/05/2018 1021   LEUKOCYTESUR NEGATIVE 07/05/2018 1021      Imaging Results:    CT Abdomen Pelvis W Contrast  Result Date: 07/04/2019 CLINICAL DATA:  Nausea and vomiting for 2 days. EXAM: CT ABDOMEN AND PELVIS WITH CONTRAST TECHNIQUE: Multidetector CT imaging of the abdomen and pelvis was performed using the standard protocol  following bolus administration of intravenous contrast. CONTRAST:  175m OMNIPAQUE IOHEXOL 300 MG/ML  SOLN COMPARISON:  February 09, 2018 FINDINGS: Lower chest: No acute abnormality. Hepatobiliary: No focal liver abnormality is seen. There is moderate severity central intrahepatic biliary dilatation with dilatation of the common bile duct. A moderate amount of intrahepatic biliary air is seen which extends into the common bile duct. Gallstones and a moderate amount of heterogeneous attenuation is seen within the gallbladder lumen. A mild-to-moderate amount of air is also seen within the gallbladder. Pancreas: Unremarkable. No pancreatic ductal dilatation or surrounding inflammatory changes. Spleen: Normal in size without focal abnormality. Adrenals/Urinary Tract: Adrenal glands are unremarkable. Kidneys are normal in size, without renal calculi or hydronephrosis. A 1.6 cm simple cyst is seen within the lateral aspect of the mid right kidney. A 2.0 cm exophytic cyst is seen along the medial aspect of the mid left kidney. Bladder is unremarkable. Stomach/Bowel: Stomach is within normal limits. Appendix appears normal. Multiple dilated loops of fluid-filled small bowel  are seen throughout the abdomen and pelvis (maximum small bowel diameter of approximately 4.0 cm). Mild Peri intestinal inflammatory fat stranding is seen throughout the mesentery. Vascular/Lymphatic: Moderate to marked severity aortic tortuosity and atherosclerosis. No enlarged abdominal or pelvic lymph nodes. Reproductive: Uterus and bilateral adnexa are unremarkable. Other: No abdominal wall hernia or abnormality. No abdominopelvic ascites. Musculoskeletal: Degenerative changes are seen throughout the lumbar spine. IMPRESSION: 1. Multiple dilated loops of fluid-filled small bowel throughout the abdomen and pelvis, with associated mild peri intestinal inflammatory fat stranding. This is consistent with an infectious or inflammatory enteritis and  subsequent ileus. 2. Cholelithiasis, moderate severity central intrahepatic biliary dilatation and common bile duct dilatation with a moderate amount of pneumobilia and air within the gallbladder lumen. This is consistent with sequelae associated with emphysematous cholecystitis. Aortic Atherosclerosis (ICD10-I70.0). Electronically Signed   By: Virgina Norfolk M.D.   On: 07/04/2019 23:13   DG Chest Portable 1 View  Result Date: 07/04/2019 CLINICAL DATA:  Shortness of breath. Nausea and vomiting for 2 days. EXAM: PORTABLE CHEST 1 VIEW COMPARISON:  Radiograph 07/11/2018 FINDINGS: Lower most aspect of the chest not included in the field of view. Left-sided pacemaker remains in place. Stable cardiomegaly with transcatheter aortic valve replacement. No focal airspace disease or pulmonary edema. No large pleural effusion. No pneumothorax. Degenerative change of both shoulders. No acute osseous abnormalities are seen. IMPRESSION: Stable cardiomegaly. No acute abnormality. Electronically Signed   By: Keith Rake M.D.   On: 07/04/2019 19:04    My personal review of EKG: Rhythm NSR, no ST changes   Assessment & Plan:    Active Problems:   Enteritis   1. Enteritis-patient presenting with nausea vomiting abdominal pain.  CT scan showed multiple dilated loops of fluid-filled small bowel throughout the abdomen pelvis with associated mild periintestinal inflammatory fat stranding.  Consistent with infectious or inflammatory enteritis and subsequent ileus.  Patient started on ciprofloxacin and Flagyl in the ED.  Would continue with these antibiotics.  We will keep her n.p.o.  IV normal saline at 100 mL/h.  Morphine 2 mg IV every 4 hours as needed for pain.  Zofran for nausea and vomiting.  Consult general surgery in a.m. 2. Cholelithiasis/?  Pneumobilia-CT shows central intrahepatic biliary dilation common bile duct dilatation with moderate amount of pneumobilia and air within the gallbladder lumen.  Patient  has previous history of cholelithiasis with ERCP in the past.  She was supposed to get cholecystectomy but it was postponed due to COVID-19 infection last year.  Patient will be kept n.p.o.  Consult general surgery in a.m.  Will obtain abdominal ultrasound in a.m. 3. Acute respiratory failure with hypoxia-patient presented with hypoxia with O2 sats in 80s, she was put on 2 L/min of oxygen.  Chest x-ray is unremarkable.  Patient does have a history of chronic diastolic CHF.  She has been started on IV normal saline as above.  Will monitor strict intake and output.  SARS-CoV-2 RT-PCR is negative. 4. Severe aortic stenosis-status post TAVR.  Stable 5. Hypertension-blood pressure is mildly elevated.  Start hydralazine 10 mg IV every 4 hours as needed for BP more than 160/100. 6.    DVT Prophylaxis-   Lovenox   AM Labs Ordered, also please review Full Orders  Family Communication: Admission, patients condition and plan of care including tests being ordered have been discussed with the patient  who indicate understanding and agree with the plan and Code Status.  Code Status:  Full code  Admission status:  Inpatient :The appropriate admission status for this patient is INPATIENT. Inpatient status is judged to be reasonable and necessary in order to provide the required intensity of service to ensure the patient's safety. The patient's presenting symptoms, physical exam findings, and initial radiographic and laboratory data in the context of their chronic comorbidities is felt to place them at high risk for further clinical deterioration. Furthermore, it is not anticipated that the patient will be medically stable for discharge from the hospital within 2 midnights of admission. The following factors support the admission status of inpatient.     The patient's presenting symptoms include abdominal pain, vomiting The worrisome physical exam findings include abdominal tenderness. The initial radiographic  and laboratory data are worrisome because of enteritis seen on CT abdomen pelvis.      * I certify that at the point of admission it is my clinical judgment that the patient will require inpatient hospital care spanning beyond 2 midnights from the point of admission due to high intensity of service, high risk for further deterioration and high frequency of surveillance required.*  Time spent in minutes : 60 minutes   Elaisha Zahniser S Alexandre Lightsey M.D

## 2019-07-05 NOTE — Progress Notes (Signed)
Patient admitted to the hospital earlier this morning by Dr. Sharl Ma  Patient seen and examined. She is not having any abdominal pain at present. No nausea or vomiting.  She is having flatus, but has not had a bowel movement.  Abdomen is soft, nontender, bowel sounds are active.  Assessment/plan:  1. Gallstone ileus.  Appreciate general surgery assistance.  Will likely need exploratory laparotomy with extraction of gallstone and possible partial small bowel resection. 2. Chronic diastolic heart failure.  Currently appears compensated.  No signs of pulmonary edema imaging.  Continue to follow. 3. History of severe aortic stenosis, status post TAVR 4. History of complete heart block status post pacemaker 5. Morbid obesity.  Would benefit from weight loss  [employer]

## 2019-07-06 ENCOUNTER — Inpatient Hospital Stay (HOSPITAL_COMMUNITY): Payer: Medicare PPO

## 2019-07-06 DIAGNOSIS — K8020 Calculus of gallbladder without cholecystitis without obstruction: Secondary | ICD-10-CM

## 2019-07-06 DIAGNOSIS — I340 Nonrheumatic mitral (valve) insufficiency: Secondary | ICD-10-CM

## 2019-07-06 DIAGNOSIS — I350 Nonrheumatic aortic (valve) stenosis: Secondary | ICD-10-CM

## 2019-07-06 DIAGNOSIS — I5032 Chronic diastolic (congestive) heart failure: Secondary | ICD-10-CM

## 2019-07-06 DIAGNOSIS — I351 Nonrheumatic aortic (valve) insufficiency: Secondary | ICD-10-CM

## 2019-07-06 DIAGNOSIS — I342 Nonrheumatic mitral (valve) stenosis: Secondary | ICD-10-CM

## 2019-07-06 NOTE — Progress Notes (Signed)
*  PRELIMINARY RESULTS* Echocardiogram 2D Echocardiogram has been performed.  Jeryl Columbia 07/06/2019, 4:28 PM

## 2019-07-06 NOTE — Progress Notes (Signed)
PROGRESS NOTE    CHRYSTEN WOULFE  ZVJ:[id number] DOB: February 25, 1941 DOA: 07/04/2019 PCP: Benita Stabile, MD    Brief Narrative:  79 year old female with a history of chronic diastolic heart failure, severe aortic stenosis status post TAVR, history of complete heart block status post pacemaker, presents to the hospital with nausea, vomiting and abdominal pain.  CT scan indicated pneumobilia as well as small bowel enteritis consistent with possible ileus.  She was seen by general surgery and noted to have a gallstone ileus.  There was a fistulous tract from the gallbladder to the small bowel.  Plans are for possible operative management.   Assessment & Plan:   Principal Problem:   Gallstone ileus (HCC) Active Problems:   Severe aortic stenosis   Obesity, Class III, BMI 40-49.9 (morbid obesity) (HCC)   Chronic diastolic CHF (congestive heart failure), NYHA class 2 (HCC)   S/P TAVR (transcatheter aortic valve replacement)   Enteritis   Complete heart block (HCC)   History of cardiac pacemaker   Cholelithiasis   1. Gallstone ileus.  Appreciate general surgery assistance.    The patient is having bowel movements which indicates that her partial small bowel obstruction may be resolving.  Plans are for small bowel follow-through in a.m. 2. Chronic diastolic heart failure.  Currently appears compensated.  No signs of pulmonary edema imaging.  Discontinue further IV fluids so as not to precipitate heart failure exacerbation. 3. History of severe aortic stenosis, status post TAVR 4. History of complete heart block status post pacemaker 5. Morbid obesity.  Would benefit from weight loss 6. Preoperative evaluation.  Will likely need cardiology consultation for cardiac clearance.  Echocardiogram has been performed.   DVT prophylaxis: Lovenox Code Status: Full code Family Communication: Discussed with daughter over the phone on 3/6 Disposition Plan: Discharge home when cleared by general  surgery.  Disposition will depend on whether patient will need operative management   Consultants:   General surgery  Procedures:   Echo  Antimicrobials:   Cipro 3/6 >  Flagyl 3/6 >   Subjective: No nausea or vomiting.  Feels abdominal pain is improving.  She is having bowel movements.  Objective: Vitals:   07/05/19 2240 07/05/19 2325 07/06/19 0434 07/06/19 1647  BP:   (!) 115/47 (!) 147/67  Pulse:   82 76  Resp:   19 17  Temp:   98.7 F (37.1 C) 98.9 F (37.2 C)  TempSrc:   Oral Oral  SpO2: 94% 93% 95% 94%  Weight:      Height:       No intake or output data in the 24 hours ending 07/06/19 1757 Filed Weights   07/04/19 1741  Weight: 121.6 kg    Examination:  General exam: Appears calm and comfortable  Respiratory system: Clear to auscultation. Respiratory effort normal. Cardiovascular system: S1 & S2 heard, RRR. No JVD, murmurs, rubs, gallops or clicks.  Gastrointestinal system: Abdomen is nondistended, soft and nontender. No organomegaly or masses felt. Normal bowel sounds heard. Central nervous system: Alert and oriented. No focal neurological deficits. Extremities: Chronic venous stasis changes with 1+ edema bilaterally Skin: No rashes, lesions or ulcers Psychiatry: Judgement and insight appear normal. Mood & affect appropriate.     Data Reviewed: I have personally reviewed following labs and imaging studies  CBC: Recent Labs  Lab 07/04/19 2000 07/05/19 0709  WBC 12.9* 11.8*  NEUTROABS 10.3*  --   HGB 14.0 13.0  HCT 45.3 42.4  MCV 95.8 96.1  PLT  265 272   Basic Metabolic Panel: Recent Labs  Lab 07/04/19 2000 07/05/19 0709  NA 138 138  K 4.4 4.1  CL 96* 98  CO2 34* 32  GLUCOSE 115* 109*  BUN 27* 24*  CREATININE 0.81 0.68  CALCIUM 9.4 8.9   GFR: Estimated Creatinine Clearance: 77 mL/min (by C-G formula based on SCr of 0.68 mg/dL). Liver Function Tests: Recent Labs  Lab 07/04/19 2000 07/05/19 0709  AST 18 16  ALT 11 10    ALKPHOS 69 64  BILITOT 0.5 0.8  PROT 7.4 6.5  ALBUMIN 3.6 3.1*   Recent Labs  Lab 07/04/19 2000  LIPASE 14   No results for input(s): AMMONIA in the last 168 hours. Coagulation Profile: No results for input(s): INR, PROTIME in the last 168 hours. Cardiac Enzymes: No results for input(s): CKTOTAL, CKMB, CKMBINDEX, TROPONINI in the last 168 hours. BNP (last 3 results) No results for input(s): PROBNP in the last 8760 hours. HbA1C: No results for input(s): HGBA1C in the last 72 hours. CBG: No results for input(s): GLUCAP in the last 168 hours. Lipid Profile: No results for input(s): CHOL, HDL, LDLCALC, TRIG, CHOLHDL, LDLDIRECT in the last 72 hours. Thyroid Function Tests: No results for input(s): TSH, T4TOTAL, FREET4, T3FREE, THYROIDAB in the last 72 hours. Anemia Panel: No results for input(s): VITAMINB12, FOLATE, FERRITIN, TIBC, IRON, RETICCTPCT in the last 72 hours. Sepsis Labs: No results for input(s): PROCALCITON, LATICACIDVEN in the last 168 hours.  Recent Results (from the past 240 hour(s))  Respiratory Panel by RT PCR (Flu A&B, Covid) - Nasopharyngeal Swab     Status: None   Collection Time: 07/04/19  9:45 PM   Specimen: Nasopharyngeal Swab  Result Value Ref Range Status   SARS Coronavirus 2 by RT PCR NEGATIVE NEGATIVE Final    Comment: (NOTE) SARS-CoV-2 target nucleic acids are NOT DETECTED. The SARS-CoV-2 RNA is generally detectable in upper respiratoy specimens during the acute phase of infection. The lowest concentration of SARS-CoV-2 viral copies this assay can detect is 131 copies/mL. A negative result does not preclude SARS-Cov-2 infection and should not be used as the sole basis for treatment or other patient management decisions. A negative result may occur with  improper specimen collection/handling, submission of specimen other than nasopharyngeal swab, presence of viral mutation(s) within the areas targeted by this assay, and inadequate number of viral  copies (<131 copies/mL). A negative result must be combined with clinical observations, patient history, and epidemiological information. The expected result is Negative. Fact Sheet for Patients:  https://www.moore.com/ Fact Sheet for Healthcare Providers:  https://www.young.biz/ This test is not yet ap proved or cleared by the [place] FDA and  has been authorized for detection and/or diagnosis of SARS-CoV-2 by FDA under an Emergency Use Authorization (EUA). This EUA will remain  in effect (meaning this test can be used) for the duration of the COVID-19 declaration under Section 564(b)(1) of the Act, 21 U.S.C. section 360bbb-3(b)(1), unless the authorization is terminated or revoked sooner.    Influenza A by PCR NEGATIVE NEGATIVE Final   Influenza B by PCR NEGATIVE NEGATIVE Final    Comment: (NOTE) The Xpert Xpress SARS-CoV-2/FLU/RSV assay is intended as an aid in  the diagnosis of influenza from Nasopharyngeal swab specimens and  should not be used as a sole basis for treatment. Nasal washings and  aspirates are unacceptable for Xpert Xpress SARS-CoV-2/FLU/RSV  testing. Fact Sheet for Patients: https://www.moore.com/ Fact Sheet for Healthcare Providers: https://www.young.biz/ This test is not yet approved  or cleared by the [place] and  has been authorized for detection and/or diagnosis of SARS-CoV-2 by  FDA under an Emergency Use Authorization (EUA). This EUA will remain  in effect (meaning this test can be used) for the duration of the  Covid-19 declaration under Section 564(b)(1) of the Act, 21  U.S.C. section 360bbb-3(b)(1), unless the authorization is  terminated or revoked. Performed at [facility], [address], [place], [place] [address]          Radiology Studies: US Abdomen Complete  Result Date: 07/05/2019 CLINICAL DATA:  Cholelithiasis and air in the gallbladder on an  abdomen and pelvis CT obtained yesterday. EXAM: ABDOMEN ULTRASOUND COMPLETE COMPARISON:  Abdomen and pelvis CT obtained yesterday. Limited abdomen ultrasound dated 02/06/2018. FINDINGS: Gallbladder: Interval diffuse gallbladder wall thickening with a maximum thickness of 7.9 mm. The gallbladder also contains air in the previously seen gallstone, measuring 1.4 cm in maximum diameter. No pericholecystic fluid seen. No sonographic Murphy sign. Common bile duct: Diameter: Approximately 9 mm, difficult to evaluate due to air in the common duct as well as possible common duct calculi. Liver: Intrahepatic biliary dilatation and air. No focal lesion identified. Within normal limits in parenchymal echogenicity. Portal vein is patent on color Doppler imaging with normal direction of blood flow towards the liver. IVC: No abnormality visualized. Pancreas: Obscured by overlying gas.  No visible abnormality. Spleen: Not visualized due to overlying gas and body habitus. Right Kidney: Length: 11.4 cm. Echogenicity within normal limits. No mass or hydronephrosis visualized. Left Kidney: Length: Not visualized due to overlying gas and body habitus. Abdominal aorta: Not visualized due to overlying gas and body habitus. Other findings: None. IMPRESSION: 1. Cholelithiasis with interval diffuse gallbladder wall thickening and air in the gallbladder. This could be an indication of acute or chronic cholecystitis. There was no sonographic Murphy sign. 2. Biliary ductal dilatation with air in the bile ducts making it difficult to exclude common duct stones. 3. Limited examination due to bowel gas and body habitus. Electronically Signed   By: Beckie Salts M.D.   On: 07/05/2019 11:19   CT Abdomen Pelvis W Contrast  Result Date: 07/04/2019 CLINICAL DATA:  Nausea and vomiting for 2 days. EXAM: CT ABDOMEN AND PELVIS WITH CONTRAST TECHNIQUE: Multidetector CT imaging of the abdomen and pelvis was performed using the standard protocol following  bolus administration of intravenous contrast. CONTRAST:  OMNIPAQUE IOHEXOL 300 MG/ML  SOLN COMPARISON:  February 09, 2018 FINDINGS: Lower chest: No acute abnormality. Hepatobiliary: No focal liver abnormality is seen. There is moderate severity central intrahepatic biliary dilatation with dilatation of the common bile duct. A moderate amount of intrahepatic biliary air is seen which extends into the common bile duct. Gallstones and a moderate amount of heterogeneous attenuation is seen within the gallbladder lumen. A mild-to-moderate amount of air is also seen within the gallbladder. Pancreas: Unremarkable. No pancreatic ductal dilatation or surrounding inflammatory changes. Spleen: Normal in size without focal abnormality. Adrenals/Urinary Tract: Adrenal glands are unremarkable. Kidneys are normal in size, without renal calculi or hydronephrosis. A 1.6 cm simple cyst is seen within the lateral aspect of the mid right kidney. A 2.0 cm exophytic cyst is seen along the medial aspect of the mid left kidney. Bladder is unremarkable. Stomach/Bowel: Stomach is within normal limits. Appendix appears normal. Multiple dilated loops of fluid-filled small bowel are seen throughout the abdomen and pelvis (maximum small bowel diameter of approximately 4.0 cm). Mild Peri intestinal inflammatory fat stranding is seen  throughout the mesentery. Vascular/Lymphatic: Moderate to marked severity aortic tortuosity and atherosclerosis. No enlarged abdominal or pelvic lymph nodes. Reproductive: Uterus and bilateral adnexa are unremarkable. Other: No abdominal wall hernia or abnormality. No abdominopelvic ascites. Musculoskeletal: Degenerative changes are seen throughout the lumbar spine. IMPRESSION: 1. Multiple dilated loops of fluid-filled small bowel throughout the abdomen and pelvis, with associated mild peri intestinal inflammatory fat stranding. This is consistent with an infectious or inflammatory enteritis and subsequent ileus.  2. Cholelithiasis, moderate severity central intrahepatic biliary dilatation and common bile duct dilatation with a moderate amount of pneumobilia and air within the gallbladder lumen. This is consistent with sequelae associated with emphysematous cholecystitis. Aortic Atherosclerosis (ICD10-I70.0). Electronically Signed   By: Virgina Norfolk M.D.   On: 07/04/2019 23:13   DG Chest Portable 1 View  Result Date: 07/04/2019 CLINICAL DATA:  Shortness of breath. Nausea and vomiting for 2 days. EXAM: PORTABLE CHEST 1 VIEW COMPARISON:  Radiograph 07/11/2018 FINDINGS: Lower most aspect of the chest not included in the field of view. Left-sided pacemaker remains in place. Stable cardiomegaly with transcatheter aortic valve replacement. No focal airspace disease or pulmonary edema. No large pleural effusion. No pneumothorax. Degenerative change of both shoulders. No acute osseous abnormalities are seen. IMPRESSION: Stable cardiomegaly. No acute abnormality. Electronically Signed   By: Keith Rake M.D.   On: 07/04/2019 19:04        Scheduled Meds: . aspirin EC  81 mg Oral Daily  . enoxaparin (LOVENOX) injection  60 mg Subcutaneous Q24H  . gabapentin  300 mg Oral TID  . pravastatin  20 mg Oral QHS  . venlafaxine  37.5 mg Oral Daily   Continuous Infusions: . sodium chloride 100 mL/hr at 07/05/19 0454  . ciprofloxacin 400 mg (07/06/19 0915)  . metronidazole 500 mg (07/06/19 1442)     LOS: 1 day    Time spent: 23mins    Kathie Dike, MD [facility]   If 7PM-7AM, please contact night-coverage [email or web address]  07/06/2019, 5:57 PM

## 2019-07-06 NOTE — Progress Notes (Signed)
Subjective: Patient reports having multiple bowel movements.  She denies any nausea or vomiting.  Patient feels achy, but does feel better since admission.  Objective: Vital signs in last 24 hours: Temp:  [98.2 F (36.8 C)-98.7 F (37.1 C)] 98.7 F (37.1 C) (03/07 0434) Pulse Rate:  [78-96] 82 (03/07 0434) Resp:  [16-19] 19 (03/07 0434) BP: (115-145)/(47-67) 115/47 (03/07 0434) SpO2:  [80 %-97 %] 95 % (03/07 0434) Last BM Date: 07/05/19  Intake/Output from previous day: 03/06 0701 - 03/07 0700 In: 1390 [I.V.:990; IV Piggyback:400] Out: 2 [Stool:2] Intake/Output this shift: No intake/output data recorded.  General appearance: alert, cooperative, appears stated age and no distress GI: soft, non-tender; bowel sounds normal; no masses,  no organomegaly  Lab Results:  Recent Labs    07/04/19 2000 07/05/19 0709  WBC 12.9* 11.8*  HGB 14.0 13.0  HCT 45.3 42.4  PLT 265 272   BMET Recent Labs    07/04/19 2000 07/05/19 0709  NA 138 138  K 4.4 4.1  CL 96* 98  CO2 34* 32  GLUCOSE 115* 109*  BUN 27* 24*  CREATININE 0.81 0.68  CALCIUM 9.4 8.9   PT/INR No results for input(s): LABPROT, INR in the last 72 hours.  Studies/Results: US Abdomen Complete  Result Date: 07/05/2019 CLINICAL DATA:  Cholelithiasis and air in the gallbladder on an abdomen and pelvis CT obtained yesterday. EXAM: ABDOMEN ULTRASOUND COMPLETE COMPARISON:  Abdomen and pelvis CT obtained yesterday. Limited abdomen ultrasound dated 02/06/2018. FINDINGS: Gallbladder: Interval diffuse gallbladder wall thickening with a maximum thickness of 7.9 mm. The gallbladder also contains air in the previously seen gallstone, measuring 1.4 cm in maximum diameter. No pericholecystic fluid seen. No sonographic Murphy sign. Common bile duct: Diameter: Approximately 9 mm, difficult to evaluate due to air in the common duct as well as possible common duct calculi. Liver: Intrahepatic biliary dilatation and air. No focal lesion  identified. Within normal limits in parenchymal echogenicity. Portal vein is patent on color Doppler imaging with normal direction of blood flow towards the liver. IVC: No abnormality visualized. Pancreas: Obscured by overlying gas.  No visible abnormality. Spleen: Not visualized due to overlying gas and body habitus. Right Kidney: Length: 11.4 cm. Echogenicity within normal limits. No mass or hydronephrosis visualized. Left Kidney: Length: Not visualized due to overlying gas and body habitus. Abdominal aorta: Not visualized due to overlying gas and body habitus. Other findings: None. IMPRESSION: 1. Cholelithiasis with interval diffuse gallbladder wall thickening and air in the gallbladder. This could be an indication of acute or chronic cholecystitis. There was no sonographic Murphy sign. 2. Biliary ductal dilatation with air in the bile ducts making it difficult to exclude common duct stones. 3. Limited examination due to bowel gas and body habitus. Electronically Signed   By: Beckie Salts M.D.   On: 07/05/2019 11:19   CT Abdomen Pelvis W Contrast  Result Date: 07/04/2019 CLINICAL DATA:  Nausea and vomiting for 2 days. EXAM: CT ABDOMEN AND PELVIS WITH CONTRAST TECHNIQUE: Multidetector CT imaging of the abdomen and pelvis was performed using the standard protocol following bolus administration of intravenous contrast. CONTRAST:  OMNIPAQUE IOHEXOL 300 MG/ML  SOLN COMPARISON:  February 09, 2018 FINDINGS: Lower chest: No acute abnormality. Hepatobiliary: No focal liver abnormality is seen. There is moderate severity central intrahepatic biliary dilatation with dilatation of the common bile duct. A moderate amount of intrahepatic biliary air is seen which extends into the common bile duct. Gallstones and a moderate amount of heterogeneous attenuation  is seen within the gallbladder lumen. A mild-to-moderate amount of air is also seen within the gallbladder. Pancreas: Unremarkable. No pancreatic ductal dilatation  or surrounding inflammatory changes. Spleen: Normal in size without focal abnormality. Adrenals/Urinary Tract: Adrenal glands are unremarkable. Kidneys are normal in size, without renal calculi or hydronephrosis. A 1.6 cm simple cyst is seen within the lateral aspect of the mid right kidney. A 2.0 cm exophytic cyst is seen along the medial aspect of the mid left kidney. Bladder is unremarkable. Stomach/Bowel: Stomach is within normal limits. Appendix appears normal. Multiple dilated loops of fluid-filled small bowel are seen throughout the abdomen and pelvis (maximum small bowel diameter of approximately 4.0 cm). Mild Peri intestinal inflammatory fat stranding is seen throughout the mesentery. Vascular/Lymphatic: Moderate to marked severity aortic tortuosity and atherosclerosis. No enlarged abdominal or pelvic lymph nodes. Reproductive: Uterus and bilateral adnexa are unremarkable. Other: No abdominal wall hernia or abnormality. No abdominopelvic ascites. Musculoskeletal: Degenerative changes are seen throughout the lumbar spine. IMPRESSION: 1. Multiple dilated loops of fluid-filled small bowel throughout the abdomen and pelvis, with associated mild peri intestinal inflammatory fat stranding. This is consistent with an infectious or inflammatory enteritis and subsequent ileus. 2. Cholelithiasis, moderate severity central intrahepatic biliary dilatation and common bile duct dilatation with a moderate amount of pneumobilia and air within the gallbladder lumen. This is consistent with sequelae associated with emphysematous cholecystitis. Aortic Atherosclerosis (ICD10-I70.0). Electronically Signed   By: Virgina Norfolk M.D.   On: 07/04/2019 23:13   DG Chest Portable 1 View  Result Date: 07/04/2019 CLINICAL DATA:  Shortness of breath. Nausea and vomiting for 2 days. EXAM: PORTABLE CHEST 1 VIEW COMPARISON:  Radiograph 07/11/2018 FINDINGS: Lower most aspect of the chest not included in the field of view. Left-sided  pacemaker remains in place. Stable cardiomegaly with transcatheter aortic valve replacement. No focal airspace disease or pulmonary edema. No large pleural effusion. No pneumothorax. Degenerative change of both shoulders. No acute osseous abnormalities are seen. IMPRESSION: Stable cardiomegaly. No acute abnormality. Electronically Signed   By: Keith Rake M.D.   On: 07/04/2019 19:04    Anti-infectives: Anti-infectives (From admission, onward)   Start     Dose/Rate Route Frequency Ordered Stop   07/05/19 1000  ciprofloxacin (CIPRO) IVPB 400 mg     400 mg 200 mL/hr over 60 Minutes Intravenous Every 12 hours 07/05/19 0345     07/05/19 0600  metroNIDAZOLE (FLAGYL) IVPB 500 mg     500 mg 100 mL/hr over 60 Minutes Intravenous Every 8 hours 07/05/19 0345     07/04/19 2330  ciprofloxacin (CIPRO) IVPB 400 mg     400 mg 200 mL/hr over 60 Minutes Intravenous  Once 07/04/19 2326 07/05/19 0120   07/04/19 2330  metroNIDAZOLE (FLAGYL) IVPB 500 mg     500 mg 100 mL/hr over 60 Minutes Intravenous  Once 07/04/19 2326 07/05/19 0229      Assessment/Plan: Impression: Gallstone ileus, cholelithiasis, dilated common bile duct.  Patient's partial bowel obstruction seems to be resolving. Plan: Will do small bowel series tomorrow to assess whether the gallstone has passed into the colon.  Consultation with cardiology pending.  2D echo pending.  No need for acute surgical intervention at this time.  LOS: 1 day    Aviva Signs 07/06/2019

## 2019-07-07 ENCOUNTER — Inpatient Hospital Stay (HOSPITAL_COMMUNITY): Payer: Medicare PPO

## 2019-07-07 ENCOUNTER — Encounter (HOSPITAL_COMMUNITY): Payer: Self-pay | Admitting: Family Medicine

## 2019-07-07 DIAGNOSIS — Z01810 Encounter for preprocedural cardiovascular examination: Secondary | ICD-10-CM

## 2019-07-07 DIAGNOSIS — K563 Gallstone ileus: Principal | ICD-10-CM

## 2019-07-07 DIAGNOSIS — I442 Atrioventricular block, complete: Secondary | ICD-10-CM

## 2019-07-07 DIAGNOSIS — Z950 Presence of cardiac pacemaker: Secondary | ICD-10-CM

## 2019-07-07 DIAGNOSIS — Z952 Presence of prosthetic heart valve: Secondary | ICD-10-CM

## 2019-07-07 LAB — BASIC METABOLIC PANEL
Anion gap: 9 (ref 5–15)
BUN: 15 mg/dL (ref 8–23)
CO2: 26 mmol/L (ref 22–32)
Calcium: 8.2 mg/dL — ABNORMAL LOW (ref 8.9–10.3)
Chloride: 101 mmol/L (ref 98–111)
Creatinine, Ser: 0.61 mg/dL (ref 0.44–1.00)
GFR calc Af Amer: >60 mL/min (ref >60)
GFR calc non Af Amer: >60 mL/min (ref >60)
Glucose, Bld: 96 mg/dL (ref 70–99)
Potassium: 3.7 mmol/L (ref 3.5–5.1)
Sodium: 136 mmol/L (ref 135–145)

## 2019-07-07 LAB — CBC
HCT: 40.9 % (ref 36.0–46.0)
Hemoglobin: 12.8 g/dL (ref 12.0–15.0)
MCH: 29.6 pg (ref 26.0–34.0)
MCHC: 31.3 g/dL (ref 30.0–36.0)
MCV: 94.7 fL (ref 80.0–100.0)
Platelets: 239 10*3/uL (ref 150–400)
RBC: 4.32 MIL/uL (ref 3.87–5.11)
RDW: 15.2 % (ref 11.5–15.5)
WBC: 7.9 10*3/uL (ref 4.0–10.5)
nRBC: 0 % (ref 0.0–0.2)

## 2019-07-07 LAB — ECHOCARDIOGRAM COMPLETE
Height: 66 in
Weight: 4288 oz

## 2019-07-07 MED ORDER — METRONIDAZOLE 500 MG PO TABS
500.0000 mg | ORAL_TABLET | Freq: Three times a day (TID) | ORAL | Status: DC
  Administered 2019-07-07 – 2019-07-08 (×5): 500 mg via ORAL
  Filled 2019-07-07 (×5): qty 1

## 2019-07-07 MED ORDER — CIPROFLOXACIN HCL 250 MG PO TABS
500.0000 mg | ORAL_TABLET | Freq: Two times a day (BID) | ORAL | Status: DC
  Administered 2019-07-07 – 2019-07-08 (×4): 500 mg via ORAL
  Filled 2019-07-07 (×4): qty 2

## 2019-07-07 MED ORDER — NYSTATIN 100000 UNIT/GM EX POWD
Freq: Three times a day (TID) | CUTANEOUS | Status: DC
  Filled 2019-07-07 (×2): qty 15

## 2019-07-07 NOTE — Progress Notes (Signed)
Subjective: Patient feels well.  Denies any nausea, vomiting, abdominal pain.  Is tolerating sips of clears.  Objective: Vital signs in last 24 hours: Temp:  [98.3 F (36.8 C)-98.9 F (37.2 C)] 98.3 F (36.8 C) (03/07 2140) Pulse Rate:  [72-76] 72 (03/07 2140) Resp:  [17-18] 18 (03/07 2140) BP: (147-149)/(57-67) 149/57 (03/07 2140) SpO2:  [92 %-94 %] 92 % (03/07 2140) Last BM Date: 07/05/19  Intake/Output from previous day: 03/07 0701 - 03/08 0700 In: 240 [P.O.:240] Out: 500 [Urine:500] Intake/Output this shift: No intake/output data recorded.  General appearance: alert, cooperative and no distress GI: Soft, not particularly tender.  Difficult to assess for distention.  She does have bowel sounds present.  No rigidity is noted.  Lab Results:  Recent Labs    07/05/19 0709 07/07/19 0538  WBC 11.8* 7.9  HGB 13.0 12.8  HCT 42.4 40.9  PLT 272 239   BMET Recent Labs    07/05/19 0709 07/07/19 0538  NA 138 136  K 4.1 3.7  CL 98 101  CO2 32 26  GLUCOSE 109* 96  BUN 24* 15  CREATININE 0.68 0.61  CALCIUM 8.9 8.2*   PT/INR No results for input(s): LABPROT, INR in the last 72 hours.  Studies/Results: US Abdomen Complete  Result Date: 07/05/2019 CLINICAL DATA:  Cholelithiasis and air in the gallbladder on an abdomen and pelvis CT obtained yesterday. EXAM: ABDOMEN ULTRASOUND COMPLETE COMPARISON:  Abdomen and pelvis CT obtained yesterday. Limited abdomen ultrasound dated 02/06/2018. FINDINGS: Gallbladder: Interval diffuse gallbladder wall thickening with a maximum thickness of 7.9 mm. The gallbladder also contains air in the previously seen gallstone, measuring 1.4 cm in maximum diameter. No pericholecystic fluid seen. No sonographic Murphy sign. Common bile duct: Diameter: Approximately 9 mm, difficult to evaluate due to air in the common duct as well as possible common duct calculi. Liver: Intrahepatic biliary dilatation and air. No focal lesion identified. Within normal  limits in parenchymal echogenicity. Portal vein is patent on color Doppler imaging with normal direction of blood flow towards the liver. IVC: No abnormality visualized. Pancreas: Obscured by overlying gas.  No visible abnormality. Spleen: Not visualized due to overlying gas and body habitus. Right Kidney: Length: 11.4 cm. Echogenicity within normal limits. No mass or hydronephrosis visualized. Left Kidney: Length: Not visualized due to overlying gas and body habitus. Abdominal aorta: Not visualized due to overlying gas and body habitus. Other findings: None. IMPRESSION: 1. Cholelithiasis with interval diffuse gallbladder wall thickening and air in the gallbladder. This could be an indication of acute or chronic cholecystitis. There was no sonographic Murphy sign. 2. Biliary ductal dilatation with air in the bile ducts making it difficult to exclude common duct stones. 3. Limited examination due to bowel gas and body habitus. Electronically Signed   By: Beckie Salts M.D.   On: 07/05/2019 11:19    Anti-infectives: Anti-infectives (From admission, onward)   Start     Dose/Rate Route Frequency Ordered Stop   07/07/19 0145  ciprofloxacin (CIPRO) tablet 500 mg     500 mg Oral 2 times daily 07/07/19 0135     07/07/19 0145  metroNIDAZOLE (FLAGYL) tablet 500 mg     500 mg Oral Every 8 hours 07/07/19 0135     07/05/19 1000  ciprofloxacin (CIPRO) IVPB 400 mg  Status:  Discontinued     400 mg 200 mL/hr over 60 Minutes Intravenous Every 12 hours 07/05/19 0345 07/07/19 0134   07/05/19 0600  metroNIDAZOLE (FLAGYL) IVPB 500 mg  Status:  Discontinued     500 mg 100 mL/hr over 60 Minutes Intravenous Every 8 hours 07/05/19 0345 07/07/19 0134   07/04/19 2330  ciprofloxacin (CIPRO) IVPB 400 mg     400 mg 200 mL/hr over 60 Minutes Intravenous  Once 07/04/19 2326 07/05/19 0120   07/04/19 2330  metroNIDAZOLE (FLAGYL) IVPB 500 mg     500 mg 100 mL/hr over 60 Minutes Intravenous  Once 07/04/19 2326 07/05/19 0229       Assessment/Plan: Impression: Gallstone ileus.  Clinically, the patient does not appear to be obstructed.  We will check to see whether the gallstone has passed the ileocecal valve.  This would then preclude any surgical intervention.  Cardiology has been consulted for preoperative clearance.  We will get KUB and upright to assess for obstruction.  Continue clear liquid diet at this point.  LOS: 2 days    Aviva Signs 07/07/2019

## 2019-07-07 NOTE — Consult Note (Signed)
Cardiology Consultation:   Patient ID: CHISOM [id number]; [id number]; 02/01/1941   Admit date: 07/04/2019 Date of Consult: 07/07/2019  Primary Care Provider: Benita Stabile, MD Primary Cardiologist: Verne Carrow, MD Primary Electrophysiologist: Lewayne Bunting, MD   Patient Profile:   CORINN LARICCIA is a 79 y.o. 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 Lovell Sheehan and Dr. Kerry Hough.  History of Present Illness:   Ms. Clift is currently admitted to the hospital with recent nausea and emesis as well as abdominal pain.  CT imaging shows gallbladder disease with pneumobilia as well as evidence of small bowel enteritis/ileus. There is evidence of a fistulous tract from the gallbladder to the small bowel.  Operative discussions are underway with Dr. Lovell Sheehan.  Patient states that she lives at home in [place] with her husband.  She is functionally limited at baseline, uses a walker and a wheelchair.  She does not report any recent exertional chest tightness, no palpitations, no syncope.  Patient has a Biotronik pacemaker in place followed by Dr. Ladona Ridgel.  Device interrogation of December 2020 showed normal function.  Last echocardiogram in August 2020 showed LVEF 60 to 65%, mild mitral stenosis with moderately dilated left atrium, TAVR prosthesis in position with mild to moderate perivalvular leak and a mean gradient of 20 mmHg.  Per discussion with the valve team, follow-up imaging study was recommended for this month.  RCRI cardiac risk calculator indicates class IV range, 11% risk of major adverse cardiac event.  Past Medical History:  Diagnosis Date  . Anemia   . Anxiety   .  Arthritis   . Bilateral renal cysts   . Cholecystitis   . Chronic diastolic heart failure (HCC)   . Depression   . Essential hypertension   . History of cellulitis   . History of CVA (cerebrovascular accident)   . History of endocarditis   . Hyperlipemia   . Morbid obesity (HCC)   . Neuropathy   . Right bundle branch block (RBBB)   . 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 Severe aortic stenosis   . Sleep apnea     Past Surgical History:  Procedure Laterality Date  . Abdominal gangrene    . ADENOIDECTOMY    . Cataract surgery    . COLONOSCOPY    . ENDOSCOPIC RETROGRADE CHOLANGIOPANCREATOGRAPHY (ERCP) WITH PROPOFOL N/A 02/08/2018   Procedure: ENDOSCOPIC RETROGRADE CHOLANGIOPANCREATOGRAPHY (ERCP) WITH PROPOFOL;  Surgeon: Meridee Score Netty Starring., MD;  Location: [facility] ENDOSCOPY;  Service: Gastroenterology;  Laterality: N/A;  . EUS  02/08/2018   Procedure: UPPER ENDOSCOPIC ULTRASOUND (EUS) LINEAR;  Surgeon: Lemar Lofty., MD;  Location: [facility] ENDOSCOPY;  Service: Gastroenterology;;  . EYE SURGERY    . IR FLUORO RM 30-60 MIN  03/27/2018  . IR PERC CHOLECYSTOSTOMY  02/09/2018  . IR RADIOLOGIST EVAL & MGMT  03/26/2018  . IR RADIOLOGY PERIPHERAL GUIDED IV START  05/22/2018  . IR RADIOLOGY PERIPHERAL GUIDED IV START  05/30/2018  . IR RADIOLOGY PERIPHERAL GUIDED IV START  06/05/2018  . IR US GUIDE VASC ACCESS RIGHT  05/22/2018  . IR US GUIDE VASC ACCESS RIGHT  05/30/2018  .  IR US GUIDE VASC ACCESS RIGHT  06/05/2018  . PACEMAKER IMPLANT N/A 07/10/2018   Procedure: PACEMAKER IMPLANT;  Surgeon: Marinus Maw, MD;  Location: [facility] INVASIVE CV LAB;  Service: Cardiovascular;  Laterality: N/A;  . REMOVAL OF STONES  02/08/2018   Procedure: REMOVAL OF STONES;  Surgeon: Lemar Lofty., MD;  Location: [facility] ENDOSCOPY;  Service: Gastroenterology;;  . Dennison Mascot   02/08/2018   Procedure: Dennison Mascot;  Surgeon: Mansouraty, Netty Starring., MD;  Location: [facility] ENDOSCOPY;  Service: Gastroenterology;;  . TONSILLECTOMY    . TOOTH EXTRACTION N/A 06/18/2018   Procedure: DENTAL RESTORATION/EXTRACTIONS;  Surgeon: Ocie Doyne, DDS;  Location: [facility] OR;  Service: Oral Surgery;  Laterality: N/A;  . TRANSCATHETER AORTIC VALVE REPLACEMENT, TRANSFEMORAL N/A 07/09/2018   Procedure: TRANSCATHETER AORTIC VALVE REPLACEMENT, TRANSFEMORAL;  Surgeon: Tonny Bollman, MD;  Location: [facility] INVASIVE CV LAB;  Service: Cardiovascular;  Laterality: N/A;  . Tummy tuck       Inpatient Medications: Scheduled Meds: . aspirin EC  81 mg Oral Daily  . ciprofloxacin  500 mg Oral BID  . enoxaparin (LOVENOX) injection  60 mg Subcutaneous Q24H  . gabapentin  300 mg Oral TID  . metroNIDAZOLE  500 mg Oral Q8H  . nystatin   Topical TID  . pravastatin  20 mg Oral QHS  . venlafaxine  37.5 mg Oral Daily    PRN Meds: acetaminophen **OR** acetaminophen, hydrALAZINE, morphine injection, ondansetron (ZOFRAN) IV  Allergies:   No Known Allergies  Social History:   Social History   Socioeconomic History  . Marital status: Married    Spouse name: Not on file  . Number of children: 3  . Years of education: Not on file  . Highest education level: Not on file  Occupational History  . Occupation: Retired houswife  Tobacco Use  . Smoking status: Former Smoker    Types: Cigarettes    Quit date: 05/23/1978    Years since quitting: 41.1  . Smokeless tobacco: Never Used  . Tobacco comment: only 1 pack pewr week   Substance and Sexual Activity  . Alcohol use: Not Currently    Comment: Occasional  . Drug use: Never  . Sexual activity: Not on file  Other Topics Concern  . Not on file  Social History Narrative  . Not on file   Social Determinants of Health   Financial Resource Strain:   . Difficulty of Paying Living Expenses: Not on file  Food Insecurity:   . Worried About Brewing technologist in the Last Year: Not on file  . Ran Out of Food in the Last Year: Not on file  Transportation Needs:   . Lack of Transportation (Medical): Not on file  . Lack of Transportation (Non-Medical): Not on file  Physical Activity:   . Days of Exercise per Week: Not on file  . Minutes of Exercise per Session: Not on file  Stress:   . Feeling of Stress : Not on file  Social Connections:   . Frequency of Communication with Friends and Family: Not on file  . Frequency of Social Gatherings with Friends and Family: Not on file  . Attends Religious Services: Not on file  . Active Member of Clubs or Organizations: Not on file  . Attends Banker Meetings: Not on file  . Marital Status: Not on file  Intimate Partner Violence:   . Fear of Current or Ex-Partner: Not on file  . Emotionally Abused: Not on file  . Physically Abused: Not  on file  . Sexually Abused: Not on file    Family History:   The patient's family history includes Arthritis in her father; Cancer in her mother; Heart attack in her father; Hypertension in her father.  ROS:  Please see the history of present illness.  Recent diarrhea.  No fevers or chills.  All other ROS reviewed and negative.     Physical Exam/Data:   Vitals:   07/06/19 0434 07/06/19 1647 07/06/19 1958 07/06/19 2140  BP: (!) 115/47 (!) 147/67  (!) 149/57  Pulse: 82 76  72  Resp: 19 17  18   Temp: 98.7 F (37.1 C) 98.9 F (37.2 C)  98.3 F (36.8 C)  TempSrc: Oral Oral  Oral  SpO2: 95% 94% 94% 92%  Weight:      Height:        Intake/Output Summary (Last 24 hours) at 07/07/2019 1023 Last data filed at 07/07/2019 0535 Gross per 24 hour  Intake 240 ml  Output 500 ml  Net -260 ml   Filed Weights   07/04/19 1741  Weight: 121.6 kg   Body mass index is 43.26 kg/m.   Gen: Morbidly obese woman, no distress. HEENT: Conjunctiva and lids normal, oropharynx clear. Neck: Supple, increased girth without obvious elevated JVP or carotid bruits,  no thyromegaly. Lungs: Decreased breath sounds without wheezing, nonlabored breathing at rest. Cardiac: Regular rate and rhythm, no S3, soft systolic murmur, no pericardial rub. Abdomen: Obese, mildly tender, bowel sounds present. Extremities: Venous stasis with mild lower leg edema, distal pulses 2+. Skin: Warm and dry. Musculoskeletal: No kyphosis. Neuropsychiatric: Alert and oriented x3, affect grossly appropriate.  EKG:  An ECG dated 07/04/2019 was personally reviewed today and demonstrated:  Sinus rhythm with right bundle branch block and leftward axis.  Telemetry:  Not on telemetry.  Relevant CV Studies:  Echocardiogram 12/26/2018: 1. The left ventricle has normal systolic function with an ejection  fraction of 60-65%. The cavity size was mildly dilated. There is mildly  increased left ventricular wall thickness with severe asymmetric septal  hypertrophy.  2. The right ventricle has normal systolic function. The cavity was  normal. There is no increase in right ventricular wall thickness.  3. The mitral valve is degenerative. Moderate thickening of the anterior  mitral valve leaflet. Mild calcification of the anterior mitral valve  leaflet. There is severe mitral annular calcification present. Mild mitral  valve stenosis.  4. Left atrial size was moderately dilated.  5. The aorta is normal unless otherwise noted.  6. - TAVR: S/P 26mm TAVR . The aortic valve bioprosthesis appear more  elliptical and total circumference smaller compared to prior echo post op.  There is a lucent area in the right of the AV near to RVOT and possible  flow in it. There is a mild to  moderate perivalvular leak. The mean AVG is .  7. Compared to prior echo, the TAVR appears more elliptical in shape with  increased lucency in the perivavluar area near the RVOT. The perivavlular  AI appears to have worsened.+8. Consider TEE for further evaluation.  Laboratory Data:  Chemistry Recent Labs   Lab 07/04/19 2000 07/05/19 0709 07/07/19 0538  NA 138 138 136  K 4.4 4.1 3.7  CL 96* 98 101  CO2 34* 32 26  GLUCOSE 115* 109* 96  BUN 27* 24* 15  CREATININE 0.81 0.68 0.61  CALCIUM 9.4 8.9 8.2*  GFRNONAA >60 >60 >60  GFRAA >60 >60 >60  ANIONGAP 8 8 9  Recent Labs  Lab 07/04/19 2000 07/05/19 0709  PROT 7.4 6.5  ALBUMIN 3.6 3.1*  AST 18 16  ALT 11 10  ALKPHOS 69 64  BILITOT 0.5 0.8   Hematology Recent Labs  Lab 07/04/19 2000 07/05/19 0709 07/07/19 0538  WBC 12.9* 11.8* 7.9  RBC 4.73 4.41 4.32  HGB 14.0 13.0 12.8  HCT 45.3 42.4 40.9  MCV 95.8 96.1 94.7  MCH 29.6 29.5 29.6  MCHC 30.9 30.7 31.3  RDW 15.6* 15.3 15.2  PLT 265 272 239    Radiology/Studies:  US Abdomen Complete  Result Date: 07/05/2019 CLINICAL DATA:  Cholelithiasis and air in the gallbladder on an abdomen and pelvis CT obtained yesterday. EXAM: ABDOMEN ULTRASOUND COMPLETE COMPARISON:  Abdomen and pelvis CT obtained yesterday. Limited abdomen ultrasound dated 02/06/2018. FINDINGS: Gallbladder: Interval diffuse gallbladder wall thickening with a maximum thickness of 7.9 mm. The gallbladder also contains air in the previously seen gallstone, measuring 1.4 cm in maximum diameter. No pericholecystic fluid seen. No sonographic Murphy sign. Common bile duct: Diameter: Approximately 9 mm, difficult to evaluate due to air in the common duct as well as possible common duct calculi. Liver: Intrahepatic biliary dilatation and air. No focal lesion identified. Within normal limits in parenchymal echogenicity. Portal vein is patent on color Doppler imaging with normal direction of blood flow towards the liver. IVC: No abnormality visualized. Pancreas: Obscured by overlying gas.  No visible abnormality. Spleen: Not visualized due to overlying gas and body habitus. Right Kidney: Length: 11.4 cm. Echogenicity within normal limits. No mass or hydronephrosis visualized. Left Kidney: Length: Not visualized due to overlying gas  and body habitus. Abdominal aorta: Not visualized due to overlying gas and body habitus. Other findings: None. IMPRESSION: 1. Cholelithiasis with interval diffuse gallbladder wall thickening and air in the gallbladder. This could be an indication of acute or chronic cholecystitis. There was no sonographic Murphy sign. 2. Biliary ductal dilatation with air in the bile ducts making it difficult to exclude common duct stones. 3. Limited examination due to bowel gas and body habitus. Electronically Signed   By: Beckie Salts M.D.   On: 07/05/2019 11:19   CT Abdomen Pelvis W Contrast  Result Date: 07/04/2019 CLINICAL DATA:  Nausea and vomiting for 2 days. EXAM: CT ABDOMEN AND PELVIS WITH CONTRAST TECHNIQUE: Multidetector CT imaging of the abdomen and pelvis was performed using the standard protocol following bolus administration of intravenous contrast. CONTRAST:  OMNIPAQUE IOHEXOL 300 MG/ML  SOLN COMPARISON:  February 09, 2018 FINDINGS: Lower chest: No acute abnormality. Hepatobiliary: No focal liver abnormality is seen. There is moderate severity central intrahepatic biliary dilatation with dilatation of the common bile duct. A moderate amount of intrahepatic biliary air is seen which extends into the common bile duct. Gallstones and a moderate amount of heterogeneous attenuation is seen within the gallbladder lumen. A mild-to-moderate amount of air is also seen within the gallbladder. Pancreas: Unremarkable. No pancreatic ductal dilatation or surrounding inflammatory changes. Spleen: Normal in size without focal abnormality. Adrenals/Urinary Tract: Adrenal glands are unremarkable. Kidneys are normal in size, without renal calculi or hydronephrosis. A 1.6 cm simple cyst is seen within the lateral aspect of the mid right kidney. A 2.0 cm exophytic cyst is seen along the medial aspect of the mid left kidney. Bladder is unremarkable. Stomach/Bowel: Stomach is within normal limits. Appendix appears normal. Multiple  dilated loops of fluid-filled small bowel are seen throughout the abdomen and pelvis (maximum small bowel diameter of approximately 4.0 cm). Mild Peri  intestinal inflammatory fat stranding is seen throughout the mesentery. Vascular/Lymphatic: Moderate to marked severity aortic tortuosity and atherosclerosis. No enlarged abdominal or pelvic lymph nodes. Reproductive: Uterus and bilateral adnexa are unremarkable. Other: No abdominal wall hernia or abnormality. No abdominopelvic ascites. Musculoskeletal: Degenerative changes are seen throughout the lumbar spine. IMPRESSION: 1. Multiple dilated loops of fluid-filled small bowel throughout the abdomen and pelvis, with associated mild peri intestinal inflammatory fat stranding. This is consistent with an infectious or inflammatory enteritis and subsequent ileus. 2. Cholelithiasis, moderate severity central intrahepatic biliary dilatation and common bile duct dilatation with a moderate amount of pneumobilia and air within the gallbladder lumen. This is consistent with sequelae associated with emphysematous cholecystitis. Aortic Atherosclerosis (ICD10-I70.0). Electronically Signed   By: Aram Candela M.D.   On: 07/04/2019 23:13   DG Chest Portable 1 View  Result Date: 07/04/2019 CLINICAL DATA:  Shortness of breath. Nausea and vomiting for 2 days. EXAM: PORTABLE CHEST 1 VIEW COMPARISON:  Radiograph 07/11/2018 FINDINGS: Lower most aspect of the chest not included in the field of view. Left-sided pacemaker remains in place. Stable cardiomegaly with transcatheter aortic valve replacement. No focal airspace disease or pulmonary edema. No large pleural effusion. No pneumothorax. Degenerative change of both shoulders. No acute osseous abnormalities are seen. IMPRESSION: Stable cardiomegaly. No acute abnormality. Electronically Signed   By: Narda Rutherford M.D.   On: 07/04/2019 19:04    Assessment and Plan:   1.  Preoperative cardiac assessment in a 79 year old woman  with morbid obesity, mild mitral stenosis (with prior history of endocarditis), history of severe aortic stenosis status post TAVR in March 2020, chronic diastolic heart failure, cerebrovascular disease, and complete heart block status post Biotronik pacemaker.  Cardiac catheterization prior to TAVR did not demonstrate any significant CAD.  She is functionally limited at baseline, uses a wheelchair and a walker.  RCRI cardiac risk calculator indicates relatively high risk, class IV with 11% chance of major adverse cardiac event.  Follow-up echocardiogram is pending for reassessment of LVEF and status of TAVR.  2.  Gallstone ileus, fistulous tract from gallbladder to the small bowel as well.  Patient has been seen by Dr. Lovell Sheehan for discussion regarding operative management.  We will follow up on the echocardiogram.  Current surgical risk does not preclude proceeding with operation, may want to consider having this done in tertiary care environment however.  Plavix has been held, she continues on aspirin and Pravachol.  Signed, Nona Dell, MD  07/07/2019 10:23 AM

## 2019-07-07 NOTE — Progress Notes (Signed)
PROGRESS NOTE    Dawn Gonzales  CHE:[id number] DOB: 03-06-1941 DOA: 07/04/2019 PCP: Benita Stabile, MD    Brief Narrative:  79 year old female with a history of chronic diastolic heart failure, severe aortic stenosis status post TAVR, history of complete heart block status post pacemaker, presents to the hospital with nausea, vomiting and abdominal pain.  CT scan indicated pneumobilia as well as small bowel enteritis consistent with possible ileus.  She was seen by general surgery and noted to have a gallstone ileus.  There was a fistulous tract from the gallbladder to the small bowel.  Plans are for possible operative management.   Assessment & Plan:   Principal Problem:   Gallstone ileus (HCC) Active Problems:   Severe aortic stenosis   Obesity, Class III, BMI 40-49.9 (morbid obesity) (HCC)   Chronic diastolic CHF (congestive heart failure), NYHA class 2 (HCC)   S/P TAVR (transcatheter aortic valve replacement)   Enteritis   Complete heart block (HCC)   History of cardiac pacemaker   Cholelithiasis   1. Gallstone ileus.  Appreciate general surgery assistance.  Continues to have bowel movements, but does have some abdominal pain.  Plans are for small bowel follow-through today.  Further management per general surgery.  She is on antibiotic coverage with ciprofloxacin and Flagyl. 2. Chronic diastolic heart failure.  Currently appears compensated.  No signs of pulmonary edema imaging.   3. History of severe aortic stenosis, status post TAVR 4. History of complete heart block status post pacemaker 5. Morbid obesity.  Would benefit from weight loss 6. Preoperative evaluation.  Appreciate cardiology input for preop cardiac evaluation.    DVT prophylaxis: Lovenox Code Status: Full code Family Communication: Discussed with daughter over the phone on 3/6 Disposition Plan: Discharge home when cleared by general surgery.  Disposition will depend on whether patient will need operative  management   Consultants:   General surgery    Procedures:   Echo:1. Left ventricular ejection fraction, by estimation, is 60 to 65%. The  left ventricle has normal function. The left ventricle has no regional  wall motion abnormalities. There is mild left ventricular hypertrophy.  Left ventricular diastolic parameters  are indeterminate.  2. Right ventricular systolic function is normal. The right ventricular  size is normal. Tricuspid regurgitation signal is inadequate for assessing  PA pressure.  3. Left atrial size was severely dilated.  4. Right atrial size was mildly dilated.  5. The mitral valve is degenerative with mild thickening and  calcification associated with severe annular calcification. Mild mitral  valve regurgitation. Mild mitral stenosis. The mean mitral valve gradient  is 4.0 mmHg.  6. The aortic valve has been repaired/replaced. There is a 26 mm Edwards  Sapien 3 prosthesis in position. Aortic valve regurgitation is mild to  moderate - perivalvular near septum. Stable mean gradient of 19 mmHg.  7. The inferior vena cava is dilated in size with >50% respiratory  variability, suggesting right atrial pressure of 8 mmHg.  Antimicrobials:   Cipro 3/6 >  Flagyl 3/6 >   Subjective: No nausea or vomiting.  Feels abdominal pain is improving.  She is having bowel movements.  Objective: Vitals:   07/06/19 1647 07/06/19 1958 07/06/19 2140 07/07/19 1355  BP: (!) 147/67  (!) 149/57 (!) 166/79  Pulse: 76  72 73  Resp: 17  18 20   Temp: 98.9 F (37.2 C)  98.3 F (36.8 C) 98.3 F (36.8 C)  TempSrc: Oral  Oral   SpO2: 94% 94%  92% 97%  Weight:      Height:        Intake/Output Summary (Last 24 hours) at 07/07/2019 1810 Last data filed at 07/07/2019 0535 Gross per 24 hour  Intake 240 ml  Output 500 ml  Net -260 ml   Filed Weights   07/04/19 1741  Weight: 121.6 kg    Examination:  General exam: Appears calm and comfortable  Respiratory system:  Clear to auscultation bilaterally. Respiratory effort normal. Cardiovascular system: S1 & S2 heard, RRR. No JVD, murmurs, rubs, gallops or clicks.  Gastrointestinal system: Abdomen is nondistended, soft and mildly tender throughout abdomen. No organomegaly or masses felt. Normal bowel sounds heard. Central nervous system: Alert and oriented. No focal neurological deficits. Extremities: Chronic venous stasis changes with 1+ edema bilaterally Skin: No rashes, lesions or ulcers Psychiatry: Judgement and insight appear normal. Mood & affect appropriate.     Data Reviewed: I have personally reviewed following labs and imaging studies  CBC: Recent Labs  Lab 07/04/19 2000 07/05/19 0709 07/07/19 0538  WBC 12.9* 11.8* 7.9  NEUTROABS 10.3*  --   --   HGB 14.0 13.0 12.8  HCT 45.3 42.4 40.9  MCV 95.8 96.1 94.7  PLT 265 272 239   Basic Metabolic Panel: Recent Labs  Lab 07/04/19 2000 07/05/19 0709 07/07/19 0538  NA 138 138 136  K 4.4 4.1 3.7  CL 96* 98 101  CO2 34* 32 26  GLUCOSE 115* 109* 96  BUN 27* 24* 15  CREATININE 0.81 0.68 0.61  CALCIUM 9.4 8.9 8.2*   GFR: Estimated Creatinine Clearance: 77 mL/min (by C-G formula based on SCr of 0.61 mg/dL). Liver Function Tests: Recent Labs  Lab 07/04/19 2000 07/05/19 0709  AST 18 16  ALT 11 10  ALKPHOS 69 64  BILITOT 0.5 0.8  PROT 7.4 6.5  ALBUMIN 3.6 3.1*   Recent Labs  Lab 07/04/19 2000  LIPASE 14   No results for input(s): AMMONIA in the last 168 hours. Coagulation Profile: No results for input(s): INR, PROTIME in the last 168 hours. Cardiac Enzymes: No results for input(s): CKTOTAL, CKMB, CKMBINDEX, TROPONINI in the last 168 hours. BNP (last 3 results) No results for input(s): PROBNP in the last 8760 hours. HbA1C: No results for input(s): HGBA1C in the last 72 hours. CBG: No results for input(s): GLUCAP in the last 168 hours. Lipid Profile: No results for input(s): CHOL, HDL, LDLCALC, TRIG, CHOLHDL, LDLDIRECT in  the last 72 hours. Thyroid Function Tests: No results for input(s): TSH, T4TOTAL, FREET4, T3FREE, THYROIDAB in the last 72 hours. Anemia Panel: No results for input(s): VITAMINB12, FOLATE, FERRITIN, TIBC, IRON, RETICCTPCT in the last 72 hours. Sepsis Labs: No results for input(s): PROCALCITON, LATICACIDVEN in the last 168 hours.  Recent Results (from the past 240 hour(s))  Respiratory Panel by RT PCR (Flu A&B, Covid) - Nasopharyngeal Swab     Status: None   Collection Time: 07/04/19  9:45 PM   Specimen: Nasopharyngeal Swab  Result Value Ref Range Status   SARS Coronavirus 2 by RT PCR NEGATIVE NEGATIVE Final    Comment: (NOTE) SARS-CoV-2 target nucleic acids are NOT DETECTED. The SARS-CoV-2 RNA is generally detectable in upper respiratoy specimens during the acute phase of infection. The lowest concentration of SARS-CoV-2 viral copies this assay can detect is 131 copies/mL. A negative result does not preclude SARS-Cov-2 infection and should not be used as the sole basis for treatment or other patient management decisions. A negative result may occur with  improper specimen collection/handling, submission of specimen other than nasopharyngeal swab, presence of viral mutation(s) within the areas targeted by this assay, and inadequate number of viral copies (<131 copies/mL). A negative result must be combined with clinical observations, patient history, and epidemiological information. The expected result is Negative. Fact Sheet for Patients:  PinkCheek.be Fact Sheet for Healthcare Providers:  GravelBags.it This test is not yet ap proved or cleared by the [place] FDA and  has been authorized for detection and/or diagnosis of SARS-CoV-2 by FDA under an Emergency Use Authorization (EUA). This EUA will remain  in effect (meaning this test can be used) for the duration of the COVID-19 declaration under Section 564(b)(1) of the  Act, 21 U.S.C. section 360bbb-3(b)(1), unless the authorization is terminated or revoked sooner.    Influenza A by PCR NEGATIVE NEGATIVE Final   Influenza B by PCR NEGATIVE NEGATIVE Final    Comment: (NOTE) The Xpert Xpress SARS-CoV-2/FLU/RSV assay is intended as an aid in  the diagnosis of influenza from Nasopharyngeal swab specimens and  should not be used as a sole basis for treatment. Nasal washings and  aspirates are unacceptable for Xpert Xpress SARS-CoV-2/FLU/RSV  testing. Fact Sheet for Patients: PinkCheek.be Fact Sheet for Healthcare Providers: GravelBags.it This test is not yet approved or cleared by the [place] FDA and  has been authorized for detection and/or diagnosis of SARS-CoV-2 by  FDA under an Emergency Use Authorization (EUA). This EUA will remain  in effect (meaning this test can be used) for the duration of the  Covid-19 declaration under Section 564(b)(1) of the Act, 21  U.S.C. section 360bbb-3(b)(1), unless the authorization is  terminated or revoked. Performed at [facility], [address] [place], [place] [address]          Radiology Studies: DG Abd 2 Views  Result Date: 07/07/2019 CLINICAL DATA:  Generalized abdominal pain. EXAM: ABDOMEN - 2 VIEW COMPARISON:  None. FINDINGS: Dilated small bowel loops are noted concerning for distal small bowel obstruction or ileus. No colonic dilatation is noted. There is no evidence of free air. No radio-opaque calculi or other significant radiographic abnormality is seen. IMPRESSION: Dilated small bowel loops are noted concerning for distal small bowel obstruction or ileus. Follow-up radiographs are recommended. Electronically Signed   By: Marijo Conception M.D.   On: 07/07/2019 13:30   ECHOCARDIOGRAM COMPLETE  Result Date: 07/07/2019    ECHOCARDIOGRAM REPORT   Patient Name:   Dawn Gonzales Date of Exam: 07/06/2019 Medical Rec #:  [id number]              Height:       66.0 in Accession #:    [id number]            Weight:       268.0 lb Date of Birth:  January 04, 1941             BSA:          2.265 m Patient Age:    31 years              BP:           115/47 mmHg Patient Gender: F                     HR:           82 bpm. Exam Location:  [facility] Procedure: 2D Echo Indications:    Abnormal ECG 794.31 / R94.31  History:  Patient has prior history of Echocardiogram examinations, most                 recent 12/26/2018. CHF, TIA; Risk Factors:Former Smoker,                 Dyslipidemia and Hypertension. TAVR, RBBB, History of cardiac                 pacemaker.  Sonographer:    Jeryl Columbia RDCS (AE) Referring Phys: [phone] [facility] Marnee Sherrard IMPRESSIONS  1. Left ventricular ejection fraction, by estimation, is 60 to 65%. The left ventricle has normal function. The left ventricle has no regional wall motion abnormalities. There is mild left ventricular hypertrophy. Left ventricular diastolic parameters are indeterminate.  2. Right ventricular systolic function is normal. The right ventricular size is normal. Tricuspid regurgitation signal is inadequate for assessing PA pressure.  3. Left atrial size was severely dilated.  4. Right atrial size was mildly dilated.  5. The mitral valve is degenerative with mild thickening and calcification associated with severe annular calcification. Mild mitral valve regurgitation. Mild mitral stenosis. The mean mitral valve gradient is 4.0 mmHg.  6. The aortic valve has been repaired/replaced. 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 Aortic valve regurgitation is mild to moderate - perivalvular near septum. Stable mean gradient of 19 mmHg.  7. The inferior vena cava is dilated in size with >50% respiratory variability, suggesting right atrial pressure of 8 mmHg. FINDINGS  Left Ventricle: Left ventricular ejection fraction, by estimation, is 60 to 65%. The left ventricle has normal function. The left ventricle has no  regional wall motion abnormalities. The left ventricular internal cavity size was normal in size. There is  mild left ventricular hypertrophy. Left ventricular diastolic parameters are indeterminate. Right Ventricle: The right ventricular size is normal. No increase in right ventricular wall thickness. Right ventricular systolic function is normal. Tricuspid regurgitation signal is inadequate for assessing PA pressure. Left Atrium: Left atrial size was severely dilated. Right Atrium: Right atrial size was mildly dilated. Pericardium: There is no evidence of pericardial effusion. Mitral Valve: The mitral valve is degenerative in appearance. There is mild thickening of the mitral valve leaflet(s). There is mild calcification of the mitral valve leaflet(s). Severe mitral annular calcification. Mild mitral valve regurgitation. Mild mitral valve stenosis. MV peak gradient, 9.6 mmHg. The mean mitral valve gradient is 4.0 mmHg. Tricuspid Valve: The tricuspid valve is grossly normal. Tricuspid valve regurgitation is trivial. Aortic Valve: The aortic valve has been repaired/replaced. Aortic valve regurgitation is mild to moderate. Aortic regurgitation PHT measures 804 msec. Aortic valve mean gradient measures 19.0 mmHg. Aortic valve peak gradient measures 31.3 mmHg. Aortic valve area, by VTI measures 0.92 cm. Pulmonic Valve: The pulmonic valve was grossly normal. Pulmonic valve regurgitation is trivial. Aorta: The aortic root is normal in size and structure. Venous: The inferior vena cava is dilated in size with greater than 50% respiratory variability, suggesting right atrial pressure of 8 mmHg. IAS/Shunts: No atrial level shunt detected by color flow Doppler.  LEFT VENTRICLE PLAX 2D LVIDd:         5.09 cm  Diastology LVIDs:         2.62 cm  LV e' lateral:   7.51 cm/s LV PW:         1.34 cm  LV E/e' lateral: 16.2 LV IVS:        1.11 cm  LV e' medial:    5.33 cm/s LVOT diam:  1.90 cm  LV E/e' medial:  22.9 LV SV:          53 LV SV Index:   24 LVOT Area:     2.84 cm  RIGHT VENTRICLE RV S prime:     11.00 cm/s TAPSE (M-mode): 2.8 cm LEFT ATRIUM              Index       RIGHT ATRIUM           Index LA diam:        5.20 cm  2.30 cm/m  RA Area:     24.60 cm LA Vol (A2C):   126.0 ml 55.64 ml/m RA Volume:   80.90 ml  35.72 ml/m LA Vol (A4C):   124.0 ml 54.75 ml/m LA Biplane Vol: 126.0 ml 55.64 ml/m  AORTIC VALVE AV Area (Vmax):    0.90 cm AV Area (Vmean):   0.81 cm AV Area (VTI):     0.92 cm AV Vmax:           279.67 cm/s AV Vmean:          207.333 cm/s AV VTI:            0.579 m AV Peak Grad:      31.3 mmHg AV Mean Grad:      19.0 mmHg LVOT Vmax:         88.80 cm/s LVOT Vmean:        59.333 cm/s LVOT VTI:          0.189 m LVOT/AV VTI ratio: 0.33 AI PHT:            804 msec  AORTA Ao Root diam: 3.20 cm MITRAL VALVE MV Area (PHT): 2.42 cm     SHUNTS MV Peak grad:  9.6 mmHg     Systemic VTI:  0.19 m MV Mean grad:  4.0 mmHg     Systemic Diam: 1.90 cm MV Vmax:       1.55 m/s MV Vmean:      88.4 cm/s MV Decel Time: 313 msec MR Peak grad: 70.2 mmHg MR Vmax:      419.00 cm/s MV E velocity: 122.00 cm/s MV A velocity: 128.00 cm/s MV E/A ratio:  0.95 Nona DellSamuel Mcdowell MD Electronically signed by Nona DellSamuel Mcdowell MD Signature Date/Time: 07/07/2019/11:49:08 AM    Final         Scheduled Meds: . aspirin EC  81 mg Oral Daily  . ciprofloxacin  500 mg Oral BID  . enoxaparin (LOVENOX) injection  60 mg Subcutaneous Q24H  . gabapentin  300 mg Oral TID  . metroNIDAZOLE  500 mg Oral Q8H  . nystatin   Topical TID  . pravastatin  20 mg Oral QHS  . venlafaxine  37.5 mg Oral Daily   Continuous Infusions:    LOS: 2 days    Time spent: 30mins    Erick BlinksJehanzeb Luccia Reinheimer, MD [facility]   If 7PM-7AM, please contact night-coverage [email or web address]  07/07/2019, 6:10 PM

## 2019-07-07 NOTE — Progress Notes (Signed)
Loss of IV access x2 after 7 attempts by Dept 300 RN and ICU RN  to gain IV access. Both IV access' obtained infiltrated at this time. Pt tolerated attempts very well. MD notified and has changed IV medications to PO. Will continue to monitor patient.

## 2019-07-08 ENCOUNTER — Inpatient Hospital Stay (HOSPITAL_COMMUNITY): Payer: Medicare PPO

## 2019-07-08 MED ORDER — IOHEXOL 9 MG/ML PO SOLN
ORAL | Status: AC
  Filled 2019-07-08: qty 1000

## 2019-07-08 MED ORDER — POLYETHYLENE GLYCOL 3350 17 G PO PACK
17.0000 g | PACK | Freq: Every day | ORAL | Status: DC
  Administered 2019-07-08: 17 g via ORAL
  Filled 2019-07-08: qty 1

## 2019-07-08 MED ORDER — FUROSEMIDE 10 MG/ML IJ SOLN
20.0000 mg | Freq: Once | INTRAMUSCULAR | Status: AC
  Administered 2019-07-08: 20 mg via INTRAVENOUS
  Filled 2019-07-08: qty 2

## 2019-07-08 MED ORDER — IOHEXOL 300 MG/ML  SOLN
100.0000 mL | Freq: Once | INTRAMUSCULAR | Status: AC | PRN
  Administered 2019-07-08: 100 mL via INTRAVENOUS

## 2019-07-08 NOTE — Care Management Important Message (Signed)
Important Message  Patient Details  Name: Dawn Gonzales MRN: [id number] Date of Birth: [birth year]   Medicare Important Message Given:  Yes     Corey Harold 07/08/2019, 2:39 PM

## 2019-07-08 NOTE — Progress Notes (Signed)
Pt resting in room and has completed contrast for CT. She voices no complaints at this time. Call bell is within reach.

## 2019-07-08 NOTE — Progress Notes (Signed)
PROGRESS NOTE    Dawn Gonzales  MGQ:[id number] DOB: 1940/06/25 DOA: 07/04/2019 PCP: Benita Stabile, MD    Brief Narrative:  79 year old female with a history of chronic diastolic heart failure, severe aortic stenosis status post TAVR, history of complete heart block status post pacemaker, presents to the hospital with nausea, vomiting and abdominal pain.  CT scan indicated pneumobilia as well as small bowel enteritis consistent with possible ileus.  She was seen by general surgery and noted to have a gallstone ileus.  There was a fistulous tract from the gallbladder to the small bowel.  Plans are for possible operative management.   Assessment & Plan:   Principal Problem:   Gallstone ileus (HCC) Active Problems:   Severe aortic stenosis   Obesity, Class III, BMI 40-49.9 (morbid obesity) (HCC)   Chronic diastolic CHF (congestive heart failure), NYHA class 2 (HCC)   S/P TAVR (transcatheter aortic valve replacement)   Enteritis   Complete heart block (HCC)   History of cardiac pacemaker   Cholelithiasis   1. Gallstone ileus.  Appreciate general surgery assistance.  Patient is having bowel movements at this time.  CT scan was repeated today and it appears that gallstone ileus has resolved.  Diet is being advanced.  Discontinue further antibiotics.  No indication for surgery at this time. 2. Chronic diastolic heart failure.  She is developing some signs of volume overload with increasing lower extremity edema and crackles at bases.  Will give 1 dose of Lasix..   3. History of severe aortic stenosis, status post TAVR 4. History of complete heart block status post pacemaker 5. Morbid obesity.  Would benefit from weight loss 6. Preoperative evaluation.  Appreciate cardiology input for preop cardiac evaluation.    DVT prophylaxis: Lovenox Code Status: Full code Family Communication: Discussed with daughter over the phone on 3/6 Disposition Plan: Discharge home when cleared by general  surgery.  Disposition will depend on whether patient will need operative management   Consultants:   General surgery  Cardiology  Procedures:   Echo:1. Left ventricular ejection fraction, by estimation, is 60 to 65%. The  left ventricle has normal function. The left ventricle has no regional  wall motion abnormalities. There is mild left ventricular hypertrophy.  Left ventricular diastolic parameters  are indeterminate.  2. Right ventricular systolic function is normal. The right ventricular  size is normal. Tricuspid regurgitation signal is inadequate for assessing  PA pressure.  3. Left atrial size was severely dilated.  4. Right atrial size was mildly dilated.  5. The mitral valve is degenerative with mild thickening and  calcification associated with severe annular calcification. Mild mitral  valve regurgitation. Mild mitral stenosis. The mean mitral valve gradient  is 4.0 mmHg.  6. The aortic valve has been repaired/replaced. There is a 26 mm Edwards  Sapien 3 prosthesis in position. Aortic valve regurgitation is mild to  moderate - perivalvular near septum. Stable mean gradient of 19 mmHg.  7. The inferior vena cava is dilated in size with >50% respiratory  variability, suggesting right atrial pressure of 8 mmHg.  Antimicrobials:   Cipro 3/6 > 3/9  Flagyl 3/6 > 3/9   Subjective: She is having bowel movements.  No abdominal pain.  No nausea or vomiting.  She is feeling better.  Objective: Vitals:   07/07/19 2157 07/08/19 0500 07/08/19 1028 07/08/19 1511  BP: (!) 163/78 (!) 170/77  (!) 161/80  Pulse: 71 82  79  Resp:  20  20  Temp:  98.3 F (36.8 C) 98 F (36.7 C)  98.8 F (37.1 C)  TempSrc: Oral   Oral  SpO2: 100% 96% 97% (!) 89%  Weight:      Height:        Intake/Output Summary (Last 24 hours) at 07/08/2019 2049 Last data filed at 07/08/2019 1900 Gross per 24 hour  Intake 600 ml  Output 2550 ml  Net -1950 ml   Filed Weights   07/04/19 1741    Weight: 121.6 kg    Examination:  General exam: Appears calm and comfortable  Respiratory system: Crackles at bases. Respiratory effort normal. Cardiovascular system: S1 & S2 heard, RRR. No JVD, murmurs, rubs, gallops or clicks.  Gastrointestinal system: Abdomen is nondistended, soft and mildly tender throughout abdomen. No organomegaly or masses felt. Normal bowel sounds heard. Central nervous system: Alert and oriented. No focal neurological deficits. Extremities: 1+ edema bilaterally Skin: No rashes, lesions or ulcers Psychiatry: Judgement and insight appear normal. Mood & affect appropriate.     Data Reviewed: I have personally reviewed following labs and imaging studies  CBC: Recent Labs  Lab 07/04/19 2000 07/05/19 0709 07/07/19 0538  WBC 12.9* 11.8* 7.9  NEUTROABS 10.3*  --   --   HGB 14.0 13.0 12.8  HCT 45.3 42.4 40.9  MCV 95.8 96.1 94.7  PLT 265 272 607   Basic Metabolic Panel: Recent Labs  Lab 07/04/19 2000 07/05/19 0709 07/07/19 0538  NA 138 138 136  K 4.4 4.1 3.7  CL 96* 98 101  CO2 34* 32 26  GLUCOSE 115* 109* 96  BUN 27* 24* 15  CREATININE 0.81 0.68 0.61  CALCIUM 9.4 8.9 8.2*   GFR: Estimated Creatinine Clearance: 77 mL/min (by C-G formula based on SCr of 0.61 mg/dL). Liver Function Tests: Recent Labs  Lab 07/04/19 2000 07/05/19 0709  AST 18 16  ALT 11 10  ALKPHOS 69 64  BILITOT 0.5 0.8  PROT 7.4 6.5  ALBUMIN 3.6 3.1*   Recent Labs  Lab 07/04/19 2000  LIPASE 14   No results for input(s): AMMONIA in the last 168 hours. Coagulation Profile: No results for input(s): INR, PROTIME in the last 168 hours. Cardiac Enzymes: No results for input(s): CKTOTAL, CKMB, CKMBINDEX, TROPONINI in the last 168 hours. BNP (last 3 results) No results for input(s): PROBNP in the last 8760 hours. HbA1C: No results for input(s): HGBA1C in the last 72 hours. CBG: No results for input(s): GLUCAP in the last 168 hours. Lipid Profile: No results for  input(s): CHOL, HDL, LDLCALC, TRIG, CHOLHDL, LDLDIRECT in the last 72 hours. Thyroid Function Tests: No results for input(s): TSH, T4TOTAL, FREET4, T3FREE, THYROIDAB in the last 72 hours. Anemia Panel: No results for input(s): VITAMINB12, FOLATE, FERRITIN, TIBC, IRON, RETICCTPCT in the last 72 hours. Sepsis Labs: No results for input(s): PROCALCITON, LATICACIDVEN in the last 168 hours.  Recent Results (from the past 240 hour(s))  Respiratory Panel by RT PCR (Flu A&B, Covid) - Nasopharyngeal Swab     Status: None   Collection Time: 07/04/19  9:45 PM   Specimen: Nasopharyngeal Swab  Result Value Ref Range Status   SARS Coronavirus 2 by RT PCR NEGATIVE NEGATIVE Final    Comment: (NOTE) SARS-CoV-2 target nucleic acids are NOT DETECTED. The SARS-CoV-2 RNA is generally detectable in upper respiratoy specimens during the acute phase of infection. The lowest concentration of SARS-CoV-2 viral copies this assay can detect is 131 copies/mL. A negative result does not preclude SARS-Cov-2 infection and should not be  used as the sole basis for treatment or other patient management decisions. A negative result may occur with  improper specimen collection/handling, submission of specimen other than nasopharyngeal swab, presence of viral mutation(s) within the areas targeted by this assay, and inadequate number of viral copies (<131 copies/mL). A negative result must be combined with clinical observations, patient history, and epidemiological information. The expected result is Negative. Fact Sheet for Patients:  https://www.moore.com/ Fact Sheet for Healthcare Providers:  https://www.young.biz/ This test is not yet ap proved or cleared by the [place] FDA and  has been authorized for detection and/or diagnosis of SARS-CoV-2 by FDA under an Emergency Use Authorization (EUA). This EUA will remain  in effect (meaning this test can be used) for the duration  of the COVID-19 declaration under Section 564(b)(1) of the Act, 21 U.S.C. section 360bbb-3(b)(1), unless the authorization is terminated or revoked sooner.    Influenza A by PCR NEGATIVE NEGATIVE Final   Influenza B by PCR NEGATIVE NEGATIVE Final    Comment: (NOTE) The Xpert Xpress SARS-CoV-2/FLU/RSV assay is intended as an aid in  the diagnosis of influenza from Nasopharyngeal swab specimens and  should not be used as a sole basis for treatment. Nasal washings and  aspirates are unacceptable for Xpert Xpress SARS-CoV-2/FLU/RSV  testing. Fact Sheet for Patients: https://www.moore.com/ Fact Sheet for Healthcare Providers: https://www.young.biz/ This test is not yet approved or cleared by the [place] FDA and  has been authorized for detection and/or diagnosis of SARS-CoV-2 by  FDA under an Emergency Use Authorization (EUA). This EUA will remain  in effect (meaning this test can be used) for the duration of the  Covid-19 declaration under Section 564(b)(1) of the Act, 21  U.S.C. section 360bbb-3(b)(1), unless the authorization is  terminated or revoked. Performed at [facility], [address] [place], [place] [address]          Radiology Studies: CT ABDOMEN PELVIS W CONTRAST  Result Date: 07/08/2019 CLINICAL DATA:  Bowel obstruction suspected, gallstone ileus, nausea and vomiting EXAM: CT ABDOMEN AND PELVIS WITH CONTRAST TECHNIQUE: Multidetector CT imaging of the abdomen and pelvis was performed using the standard protocol following bolus administration of intravenous contrast. CONTRAST:  OMNIPAQUE IOHEXOL 300 MG/ML SOLN, additional oral enteric contrast COMPARISON:  07/04/2019 FINDINGS: Lower chest: New small bilateral pleural effusions and associated atelectasis or consolidation. Cardiomegaly with aortic valve stent endograft. Coronary artery calcifications. Hepatobiliary: No solid liver abnormality is seen. Redemonstrated biliary  ductal dilatation and pneumobilia, with faintly calcified gallstones in the gallbladder. Unchanged gallbladder wall thickening and trace pericholecystic fluid. Pancreas: Unremarkable. No pancreatic ductal dilatation or surrounding inflammatory changes. Spleen: Normal in size without significant abnormality. Adrenals/Urinary Tract: Adrenal glands are unremarkable. Kidneys are normal, without renal calculi, solid lesion, or hydronephrosis. Bladder is unremarkable. Stomach/Bowel: Stomach is within normal limits. Appendix appears normal. The small bowel is diffusely fluid and contrast filled, bowel loops not overly distended, measuring no greater than 3.3 cm in caliber. There are mildly inflamed appearing loops of distal ileum (e.g. Series 2, image 56). The colon is generally decompressed although there is scattered gas and stool present to the rectum. A previously seen rim calcified gallstone in the distal small bowel is now present in the rectum (series 2, image 86). Vascular/Lymphatic: Aortic atherosclerosis. No enlarged abdominal or pelvic lymph nodes. Reproductive: No mass or other significant abnormality. Other: No abdominal wall hernia or abnormality. Mild anasarca. Small volume fluid in the right lower quadrant unchanged compared to prior examination. Musculoskeletal: No  acute or significant osseous findings. IMPRESSION: 1. The small bowel is diffusely fluid and contrast filled, although bowel loops not overtly distended to suggest high-grade obstruction. There are mildly inflamed loops of distal ileum, suggesting nonspecific infectious, inflammatory, or ischemic enteritis. There may be some degree of ileus. 2. A previously seen rim calcified gallstone in the distal small bowel is now present in the rectum. 3. Unchanged biliary ductal dilatation and pneumobilia with faintly calcified gallstones in the gallbladder. Unchanged gallbladder wall thickening and trace pericholecystic fluid. 4. Unchanged small volume  fluid in the right lower quadrant. 5. New small bilateral pleural effusions and associated atelectasis or consolidation. 6. Aortic Atherosclerosis (ICD10-I70.0). Electronically Signed   By: Lauralyn Primes M.D.   On: 07/08/2019 14:34   DG Abd 2 Views  Result Date: 07/07/2019 CLINICAL DATA:  Generalized abdominal pain. EXAM: ABDOMEN - 2 VIEW COMPARISON:  None. FINDINGS: Dilated small bowel loops are noted concerning for distal small bowel obstruction or ileus. No colonic dilatation is noted. There is no evidence of free air. No radio-opaque calculi or other significant radiographic abnormality is seen. IMPRESSION: Dilated small bowel loops are noted concerning for distal small bowel obstruction or ileus. Follow-up radiographs are recommended. Electronically Signed   By: Lupita Raider M.D.   On: 07/07/2019 13:30        Scheduled Meds: . aspirin EC  81 mg Oral Daily  . enoxaparin (LOVENOX) injection  60 mg Subcutaneous Q24H  . furosemide  20 mg Intravenous Once  . gabapentin  300 mg Oral TID  . nystatin   Topical TID  . polyethylene glycol  17 g Oral Daily  . pravastatin  20 mg Oral QHS  . venlafaxine  37.5 mg Oral Daily   Continuous Infusions:    LOS: 3 days    Time spent:    Erick Blinks, MD [facility]   If 7PM-7AM, please contact night-coverage [email or web address]  07/08/2019, 8:49 PM

## 2019-07-08 NOTE — Progress Notes (Signed)
Called CT dept and spoke with Sharma Covert. He reported that the CT at the hospital is down and that the ED has them backed up, but team from [facility] will be coming this afternoon to perform the CT.

## 2019-07-08 NOTE — Progress Notes (Signed)
Subjective: Patient has no abdominal complaints.  She has been having multiple bowel movements.  She denies any nausea or vomiting.  Objective: Vital signs in last 24 hours: Temp:  [98 F (36.7 C)-98.3 F (36.8 C)] 98 F (36.7 C) (03/09 0500) Pulse Rate:  [71-82] 82 (03/09 0500) Resp:  [20] 20 (03/09 0500) BP: (163-170)/(77-78) 170/77 (03/09 0500) SpO2:  [96 %-100 %] 96 % (03/09 0500) Last BM Date: 07/07/19  Intake/Output from previous day: 03/08 0701 - 03/09 0700 In: 1080 [P.O.:1080] Out: 2400 [Urine:2400] Intake/Output this shift: No intake/output data recorded.  General appearance: alert, cooperative and no distress GI: soft, non-tender; bowel sounds normal; no masses,  no organomegaly  Lab Results:  Recent Labs    07/07/19 0538  WBC 7.9  HGB 12.8  HCT 40.9  PLT 239   BMET Recent Labs    07/07/19 0538  NA 136  K 3.7  CL 101  CO2 26  GLUCOSE 96  BUN 15  CREATININE 0.61  CALCIUM 8.2*   PT/INR No results for input(s): LABPROT, INR in the last 72 hours.  Studies/Results: CT ABDOMEN PELVIS W CONTRAST  Result Date: 07/08/2019 CLINICAL DATA:  Bowel obstruction suspected, gallstone ileus, nausea and vomiting EXAM: CT ABDOMEN AND PELVIS WITH CONTRAST TECHNIQUE: Multidetector CT imaging of the abdomen and pelvis was performed using the standard protocol following bolus administration of intravenous contrast. CONTRAST:  OMNIPAQUE IOHEXOL 300 MG/ML SOLN, additional oral enteric contrast COMPARISON:  07/04/2019 FINDINGS: Lower chest: New small bilateral pleural effusions and associated atelectasis or consolidation. Cardiomegaly with aortic valve stent endograft. Coronary artery calcifications. Hepatobiliary: No solid liver abnormality is seen. Redemonstrated biliary ductal dilatation and pneumobilia, with faintly calcified gallstones in the gallbladder. Unchanged gallbladder wall thickening and trace pericholecystic fluid. Pancreas: Unremarkable. No pancreatic  ductal dilatation or surrounding inflammatory changes. Spleen: Normal in size without significant abnormality. Adrenals/Urinary Tract: Adrenal glands are unremarkable. Kidneys are normal, without renal calculi, solid lesion, or hydronephrosis. Bladder is unremarkable. Stomach/Bowel: Stomach is within normal limits. Appendix appears normal. The small bowel is diffusely fluid and contrast filled, bowel loops not overly distended, measuring no greater than 3.3 cm in caliber. There are mildly inflamed appearing loops of distal ileum (e.g. Series 2, image 56). The colon is generally decompressed although there is scattered gas and stool present to the rectum. A previously seen rim calcified gallstone in the distal small bowel is now present in the rectum (series 2, image 86). Vascular/Lymphatic: Aortic atherosclerosis. No enlarged abdominal or pelvic lymph nodes. Reproductive: No mass or other significant abnormality. Other: No abdominal wall hernia or abnormality. Mild anasarca. Small volume fluid in the right lower quadrant unchanged compared to prior examination. Musculoskeletal: No acute or significant osseous findings. IMPRESSION: 1. The small bowel is diffusely fluid and contrast filled, although bowel loops not overtly distended to suggest high-grade obstruction. There are mildly inflamed loops of distal ileum, suggesting nonspecific infectious, inflammatory, or ischemic enteritis. There may be some degree of ileus. 2. A previously seen rim calcified gallstone in the distal small bowel is now present in the rectum. 3. Unchanged biliary ductal dilatation and pneumobilia with faintly calcified gallstones in the gallbladder. Unchanged gallbladder wall thickening and trace pericholecystic fluid. 4. Unchanged small volume fluid in the right lower quadrant. 5. New small bilateral pleural effusions and associated atelectasis or consolidation. 6. Aortic Atherosclerosis (ICD10-I70.0). Electronically Signed   By: Lauralyn Primes M.D.   On: 07/08/2019 14:34   DG Abd 2 Views  Result Date:  07/07/2019 CLINICAL DATA:  Generalized abdominal pain. EXAM: ABDOMEN - 2 VIEW COMPARISON:  None. FINDINGS: Dilated small bowel loops are noted concerning for distal small bowel obstruction or ileus. No colonic dilatation is noted. There is no evidence of free air. No radio-opaque calculi or other significant radiographic abnormality is seen. IMPRESSION: Dilated small bowel loops are noted concerning for distal small bowel obstruction or ileus. Follow-up radiographs are recommended. Electronically Signed   By: Marijo Conception M.D.   On: 07/07/2019 13:30   ECHOCARDIOGRAM COMPLETE  Result Date: 07/07/2019    ECHOCARDIOGRAM REPORT   Patient Name:   MAXIMINA PIROZZI Date of Exam: 07/06/2019 Medical Rec #:  [id number]             Height:       66.0 in Accession #:    [id number]            Weight:       268.0 lb Date of Birth:  Jan 17, 1941             BSA:          2.265 m Patient Age:    79 years              BP:           115/47 mmHg Patient Gender: F                     HR:           82 bpm. Exam Location:  [facility] Procedure: 2D Echo Indications:    Abnormal ECG 794.31 / R94.31  History:        Patient has prior history of Echocardiogram examinations, most                 recent 12/26/2018. CHF, TIA; Risk Factors:Former Smoker,                 Dyslipidemia and Hypertension. TAVR, RBBB, History of cardiac                 pacemaker.  Sonographer:    Leavy Cella RDCS (AE) Referring Phys: [place]  1. Left ventricular ejection fraction, by estimation, is 60 to 65%. The left ventricle has normal function. The left ventricle has no regional wall motion abnormalities. There is mild left ventricular hypertrophy. Left ventricular diastolic parameters are indeterminate.  2. Right ventricular systolic function is normal. The right ventricular size is normal. Tricuspid regurgitation signal is inadequate for assessing PA pressure.   3. Left atrial size was severely dilated.  4. Right atrial size was mildly dilated.  5. The mitral valve is degenerative with mild thickening and calcification associated with severe annular calcification. Mild mitral valve regurgitation. Mild mitral stenosis. The mean mitral valve gradient is 4.0 mmHg.  6. The aortic valve has been repaired/replaced. 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 Aortic valve regurgitation is mild to moderate - perivalvular near septum. Stable mean gradient of 19 mmHg.  7. The inferior vena cava is dilated in size with >50% respiratory variability, suggesting right atrial pressure of 8 mmHg. FINDINGS  Left Ventricle: Left ventricular ejection fraction, by estimation, is 60 to 65%. The left ventricle has normal function. The left ventricle has no regional wall motion abnormalities. The left ventricular internal cavity size was normal in size. There is  mild left ventricular hypertrophy. Left ventricular diastolic parameters are indeterminate. Right Ventricle: The right ventricular  size is normal. No increase in right ventricular wall thickness. Right ventricular systolic function is normal. Tricuspid regurgitation signal is inadequate for assessing PA pressure. Left Atrium: Left atrial size was severely dilated. Right Atrium: Right atrial size was mildly dilated. Pericardium: There is no evidence of pericardial effusion. Mitral Valve: The mitral valve is degenerative in appearance. There is mild thickening of the mitral valve leaflet(s). There is mild calcification of the mitral valve leaflet(s). Severe mitral annular calcification. Mild mitral valve regurgitation. Mild mitral valve stenosis. MV peak gradient, 9.6 mmHg. The mean mitral valve gradient is 4.0 mmHg. Tricuspid Valve: The tricuspid valve is grossly normal. Tricuspid valve regurgitation is trivial. Aortic Valve: The aortic valve has been repaired/replaced. Aortic valve regurgitation is mild to moderate. Aortic  regurgitation PHT measures 804 msec. Aortic valve mean gradient measures 19.0 mmHg. Aortic valve peak gradient measures 31.3 mmHg. Aortic valve area, by VTI measures 0.92 cm. Pulmonic Valve: The pulmonic valve was grossly normal. Pulmonic valve regurgitation is trivial. Aorta: The aortic root is normal in size and structure. Venous: The inferior vena cava is dilated in size with greater than 50% respiratory variability, suggesting right atrial pressure of 8 mmHg. IAS/Shunts: No atrial level shunt detected by color flow Doppler.  LEFT VENTRICLE PLAX 2D LVIDd:         5.09 cm  Diastology LVIDs:         2.62 cm  LV e' lateral:   7.51 cm/s LV PW:         1.34 cm  LV E/e' lateral: 16.2 LV IVS:        1.11 cm  LV e' medial:    5.33 cm/s LVOT diam:     1.90 cm  LV E/e' medial:  22.9 LV SV:         53 LV SV Index:   24 LVOT Area:     2.84 cm  RIGHT VENTRICLE RV S prime:     11.00 cm/s TAPSE (M-mode): 2.8 cm LEFT ATRIUM              Index       RIGHT ATRIUM           Index LA diam:        5.20 cm  2.30 cm/m  RA Area:     24.60 cm LA Vol (A2C):   126.0 ml 55.64 ml/m RA Volume:   80.90 ml  35.72 ml/m LA Vol (A4C):   124.0 ml 54.75 ml/m LA Biplane Vol: 126.0 ml 55.64 ml/m  AORTIC VALVE AV Area (Vmax):    0.90 cm AV Area (Vmean):   0.81 cm AV Area (VTI):     0.92 cm AV Vmax:           279.67 cm/s AV Vmean:          207.333 cm/s AV VTI:            0.579 m AV Peak Grad:      31.3 mmHg AV Mean Grad:      19.0 mmHg LVOT Vmax:         88.80 cm/s LVOT Vmean:        59.333 cm/s LVOT VTI:          0.189 m LVOT/AV VTI ratio: 0.33 AI PHT:            804 msec  AORTA Ao Root diam: 3.20 cm MITRAL VALVE MV Area (PHT): 2.42 cm     SHUNTS MV Peak grad:  9.6 mmHg     Systemic VTI:  0.19 m MV Mean grad:  4.0 mmHg     Systemic Diam: 1.90 cm MV Vmax:       1.55 m/s MV Vmean:      88.4 cm/s MV Decel Time: 313 msec MR Peak grad: 70.2 mmHg MR Vmax:      419.00 cm/s MV E velocity: 122.00 cm/s MV A velocity: 128.00 cm/s MV E/A ratio:  0.95  Nona Dell MD Electronically signed by Nona Dell MD Signature Date/Time: 07/07/2019/11:49:08 AM    Final     Anti-infectives: Anti-infectives (From admission, onward)   Start     Dose/Rate Route Frequency Ordered Stop   07/07/19 0145  ciprofloxacin (CIPRO) tablet 500 mg  Status:  Discontinued     500 mg Oral 2 times daily 07/07/19 0135 07/08/19 1446   07/07/19 0145  metroNIDAZOLE (FLAGYL) tablet 500 mg  Status:  Discontinued     500 mg Oral Every 8 hours 07/07/19 0135 07/08/19 1446   07/05/19 1000  ciprofloxacin (CIPRO) IVPB 400 mg  Status:  Discontinued     400 mg 200 mL/hr over 60 Minutes Intravenous Every 12 hours 07/05/19 0345 07/07/19 0134   07/05/19 0600  metroNIDAZOLE (FLAGYL) IVPB 500 mg  Status:  Discontinued     500 mg 100 mL/hr over 60 Minutes Intravenous Every 8 hours 07/05/19 0345 07/07/19 0134   07/04/19 2330  ciprofloxacin (CIPRO) IVPB 400 mg     400 mg 200 mL/hr over 60 Minutes Intravenous  Once 07/04/19 2326 07/05/19 0120   07/04/19 2330  metroNIDAZOLE (FLAGYL) IVPB 500 mg     500 mg 100 mL/hr over 60 Minutes Intravenous  Once 07/04/19 2326 07/05/19 0229      Assessment/Plan: Impression: Gallstone ileus, resolved.  Repeat CT scan of the abdomen reveals the gallstone to be within the rectum.  She does still have some mild small bowel dilatation, but no transition zone. Plan: No need for acute surgical intervention at this time.  Will advance to carb modified diet.  Will stop antibiotics.  Will await to restart Plavix until discharge.  Discussed with Dr. Kerry Hough.  LOS: 3 days    Franky Macho 07/08/2019

## 2019-07-09 DIAGNOSIS — K8020 Calculus of gallbladder without cholecystitis without obstruction: Secondary | ICD-10-CM

## 2019-07-09 DIAGNOSIS — K625 Hemorrhage of anus and rectum: Secondary | ICD-10-CM

## 2019-07-09 DIAGNOSIS — I350 Nonrheumatic aortic (valve) stenosis: Secondary | ICD-10-CM

## 2019-07-09 DIAGNOSIS — K563 Gallstone ileus: Secondary | ICD-10-CM

## 2019-07-09 LAB — BASIC METABOLIC PANEL
Anion gap: 9 (ref 5–15)
BUN: 8 mg/dL (ref 8–23)
CO2: 30 mmol/L (ref 22–32)
Calcium: 8.2 mg/dL — ABNORMAL LOW (ref 8.9–10.3)
Chloride: 97 mmol/L — ABNORMAL LOW (ref 98–111)
Creatinine, Ser: 0.65 mg/dL (ref 0.44–1.00)
GFR calc Af Amer: >60 mL/min (ref >60)
GFR calc non Af Amer: >60 mL/min (ref >60)
Glucose, Bld: 98 mg/dL (ref 70–99)
Potassium: 2.9 mmol/L — ABNORMAL LOW (ref 3.5–5.1)
Sodium: 136 mmol/L (ref 135–145)

## 2019-07-09 LAB — CBC
HCT: 38.7 % (ref 36.0–46.0)
Hemoglobin: 12.5 g/dL (ref 12.0–15.0)
MCH: 29.7 pg (ref 26.0–34.0)
MCHC: 32.3 g/dL (ref 30.0–36.0)
MCV: 91.9 fL (ref 80.0–100.0)
Platelets: 229 10*3/uL (ref 150–400)
RBC: 4.21 MIL/uL (ref 3.87–5.11)
RDW: 14.9 % (ref 11.5–15.5)
WBC: 6.9 10*3/uL (ref 4.0–10.5)
nRBC: 0 % (ref 0.0–0.2)

## 2019-07-09 MED ORDER — POTASSIUM CHLORIDE CRYS ER 20 MEQ PO TBCR
40.0000 meq | EXTENDED_RELEASE_TABLET | ORAL | Status: AC
  Administered 2019-07-09 (×2): 40 meq via ORAL
  Filled 2019-07-09 (×2): qty 2

## 2019-07-09 NOTE — Consult Note (Signed)
Referring Provider: Shon Hale, MD Primary Care Physician:  Benita Stabile, MD Primary Gastroenterologist:  Dr. Karilyn Cota  Reason for Consultation:   Rectal bleeding.  HPI:   Patient is 79 year old Caucasian female with multiple medical problems including coronary artery disease history of CVA, status post aortic valve replacement obesity history of cholecystitis treated with cholecystostomy on 2 occasions status post ERCP with sphincterotomy and stone extraction in October 2019 who presented to emergency room 5 days ago with 2-day history of nausea and vomiting as well as abdominal pain.  On presentation she was felt to have dilated fluid-filled loops of small bowel and she had calcified stone in her small bowel.  She was felt to have gallstone ileus.  She was seen by Dr. Lovell Sheehan of surgical service.  She was begun on antibiotics.  Ultrasound 4 days ago revealed cholelithiasis gallbladder wall thickening air in the gallbladder dilated bile duct but no evidence of choledocholithiasis.  Prior CT had shown pneumobilia secondary to prior sphincterotomy.  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  She began to improve with IV antibiotics.  Follow-up abdominal pelvic CT yesterday which revealed large stone with calcified rim in the rectum. Patient reports very painful defecation last night when she passed large amount of fresh blood per rectum.  She is convinced that she passed the stone.  She has not had any more rectal bleeding.  Since then her bowels have moved and she has not passed any more blood.  She is prone to diarrhea lately but no constipation.  She has been being her food down.  She is hoping to go home soon. Patient states her last colonoscopy was Dr. Jodie Echevaria of [place], [place] with removal of few small polyps.  She believes this exam was within the last couple of years.  Patient is retired.  She worked at a [employer] for few years.  She lives with her husband in [place].  She says her husband has dementia.  She had 3 children.  Her son died of fentanyl overdose.  He was in his 42s.  Her 2 daughters are in good health.   She states one of her daughters helps at home.  Patient states that she uses walker and wheelchair and able to perform usual household work. She smokes cigarettes about 10 years but quit in 1980.  She is states she smoked 1 pack of cigarettes per week.  She does not drink alcohol. Her mother died at age 64 of uterine cancer when patient was 86 months old.  Her father died at 49 of MI.  She lost 1 brother age 76 because of auto accident.  1 sister died at age 14 or 55.   Past Medical History:  Diagnosis Date  . Anemia   . Anxiety   . Arthritis   . Bilateral renal cysts   . Cholecystitis   . Chronic diastolic heart failure (HCC)   . Depression   . Essential hypertension   . History of cellulitis   . History of CVA (cerebrovascular accident)   . History of endocarditis   . Hyperlipemia   . Morbid obesity (HCC)   . Neuropathy   . Right bundle branch block (RBBB)   . 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 Severe aortic stenosis   . Sleep apnea     Past Surgical  History:  Procedure Laterality Date  . Abdominal gangrene    . ADENOIDECTOMY    . Cataract surgery    . COLONOSCOPY    . ENDOSCOPIC RETROGRADE CHOLANGIOPANCREATOGRAPHY (ERCP) WITH PROPOFOL N/A 02/08/2018   Procedure: ENDOSCOPIC RETROGRADE CHOLANGIOPANCREATOGRAPHY (ERCP) WITH PROPOFOL;  Surgeon: Meridee Score Netty Starring., MD;  Location: [facility]OSCOPY;  Service: Gastroenterology;  Laterality: N/A;  . EUS  02/08/2018   Procedure: UPPER ENDOSCOPIC ULTRASOUND (EUS) LINEAR;  Surgeon: Lemar Lofty., MD;  Location: [facility] ENDOSCOPY;  Service: Gastroenterology;;  . EYE SURGERY    . IR FLUORO RM 30-60 MIN  03/27/2018  . IR PERC  CHOLECYSTOSTOMY  02/09/2018  . IR RADIOLOGIST EVAL & MGMT  03/26/2018  . IR RADIOLOGY PERIPHERAL GUIDED IV START  05/22/2018  . IR RADIOLOGY PERIPHERAL GUIDED IV START  05/30/2018  . IR RADIOLOGY PERIPHERAL GUIDED IV START  06/05/2018  . IR US GUIDE VASC ACCESS RIGHT  05/22/2018  . IR US GUIDE VASC ACCESS RIGHT  05/30/2018  . IR US GUIDE VASC ACCESS RIGHT  06/05/2018  . PACEMAKER IMPLANT N/A 07/10/2018   Procedure: PACEMAKER IMPLANT;  Surgeon: Marinus Maw, MD;  Location: [facility] INVASIVE CV LAB;  Service: Cardiovascular;  Laterality: N/A;  . REMOVAL OF STONES  02/08/2018   Procedure: REMOVAL OF STONES;  Surgeon: Lemar Lofty., MD;  Location: [facility] ENDOSCOPY;  Service: Gastroenterology;;  . Dennison Mascot  02/08/2018   Procedure: Dennison Mascot;  Surgeon: Mansouraty, Netty Starring., MD;  Location: [facility] ENDOSCOPY;  Service: Gastroenterology;;  . TONSILLECTOMY    . TOOTH EXTRACTION N/A 06/18/2018   Procedure: DENTAL RESTORATION/EXTRACTIONS;  Surgeon: Ocie Doyne, DDS;  Location: [facility] OR;  Service: Oral Surgery;  Laterality: N/A;  . TRANSCATHETER AORTIC VALVE REPLACEMENT, TRANSFEMORAL N/A 07/09/2018   Procedure: TRANSCATHETER AORTIC VALVE REPLACEMENT, TRANSFEMORAL;  Surgeon: Tonny Bollman, MD;  Location: [facility] INVASIVE CV LAB;  Service: Cardiovascular;  Laterality: N/A;  . Tummy tuck      Prior to Admission medications   Medication Sig Start Date End Date Taking? Authorizing Provider  acetaminophen (TYLENOL) 650 MG CR tablet Take 1,300 mg by mouth every 8 (eight) hours as needed for pain.   Yes [provider]  aspirin EC 81 MG tablet Take 81 mg by mouth daily.   Yes [provider]  Calcium Carbonate (CALCIUM-CARB 600 PO) Take 600 mg by mouth daily.   Yes [provider]  cholecalciferol (VITAMIN D) 1000 units tablet Take 1,000 Units by mouth daily.   Yes [provider]  clopidogrel (PLAVIX) 75 MG tablet TAKE 1 TABLET BY MOUTH EVERY DAY 12/23/18  Yes Janetta Hora, PA-C  ferrous sulfate 325 (65 FE) MG tablet Take 325 mg by mouth daily with breakfast.   Yes [provider]  gabapentin (NEURONTIN) 300 MG capsule Take 300 mg by mouth 3 (three) times daily.   Yes [provider]  Multiple Vitamin (MULTIVITAMIN WITH MINERALS) TABS tablet Take 1 tablet by mouth daily.   Yes [provider]  silver sulfADIAZINE (SILVADENE) 1 % cream Apply 1 application topically 2 (two) times daily. 05/30/19  Yes [provider]  traMADol (ULTRAM) 50 MG tablet Take 50 mg by mouth every 6 (six) hours as needed for moderate pain.   Yes [provider]  venlafaxine (EFFEXOR) 37.5 MG tablet Take 37.5 mg by mouth daily.   Yes [provider]  vitamin C (ASCORBIC ACID) 500 MG tablet Take 500 mg by mouth daily.   Yes [provider]  Current Facility-Administered Medications  Medication Dose Route Frequency Provider Last Rate Last Admin  . acetaminophen (TYLENOL) tablet 650 mg  650 mg Oral Q6H PRN Meredeth IdeLama, Gagan S, MD   650 mg at 07/09/19 0451   Or  . acetaminophen (TYLENOL) suppository 650 mg  650 mg Rectal Q6H PRN Meredeth IdeLama, Gagan S, MD      . aspirin EC tablet 81 mg  81 mg Oral Daily Meredeth IdeLama, Gagan S, MD   81 mg at 07/09/19 [id number]  . enoxaparin (LOVENOX) injection 60 mg  60 mg Subcutaneous Q24H Meredeth IdeLama, Gagan S, MD   60 mg at 07/09/19 1626  . gabapentin (NEURONTIN) capsule 300 mg  300 mg Oral TID Meredeth IdeLama, Gagan S, MD   300 mg at 07/09/19 1625  . hydrALAZINE (APRESOLINE) injection 10 mg  10 mg Intravenous Q4H PRN Meredeth IdeLama, Gagan S, MD      . morphine 2 MG/ML injection 2 mg  2 mg Intravenous Q4H PRN Meredeth IdeLama, Gagan S, MD   2 mg at 07/08/19 0132  . nystatin (MYCOSTATIN/NYSTOP) topical powder   Topical TID Meredeth IdeLama, Gagan S, MD   Given at 07/09/19 1626  . ondansetron (ZOFRAN) injection 4 mg  4 mg Intravenous Q6H PRN Meredeth IdeLama, Gagan S, MD   4 mg at 07/05/19 0059  . pravastatin (PRAVACHOL) tablet 20 mg  20 mg Oral QHS Meredeth IdeLama, Gagan S, MD   20 mg at  07/08/19 2140  . venlafaxine (EFFEXOR) tablet 37.5 mg  37.5 mg Oral Daily Meredeth IdeLama, Gagan S, MD   37.5 mg at 07/09/19 [id number]    Allergies as of 07/04/2019  . (No Known Allergies)    Family History  Problem Relation Age of Onset  . Cancer Mother   . Hypertension Father   . Arthritis Father   . Heart attack Father     Social History   Socioeconomic History  . Marital status: Married    Spouse name: Not on file  . Number of children: 3  . Years of education: Not on file  . Highest education level: Not on file  Occupational History  . Occupation: Retired houswife  Tobacco Use  . Smoking status: Former Smoker    Types: Cigarettes    Quit date: 05/23/1978    Years since quitting: 41.1  . Smokeless tobacco: Never Used  . Tobacco comment: only 1 pack pewr week   Substance and Sexual Activity  . Alcohol use: Not Currently    Comment: Occasional  . Drug use: Never  . Sexual activity: Not on file  Other Topics Concern  . Not on file  Social History Narrative  . Not on file   Social Determinants of Health   Financial Resource Strain:   . Difficulty of Paying Living Expenses: Not on file  Food Insecurity:   . Worried About Programme researcher, broadcasting/film/videounning Out of Food in the Last Year: Not on file  . Ran Out of Food in the Last Year: Not on file  Transportation Needs:   . Lack of Transportation (Medical): Not on file  . Lack of Transportation (Non-Medical): Not on file  Physical Activity:   . Days of Exercise per Week: Not on file  . Minutes of Exercise per Session: Not on file  Stress:   . Feeling of Stress : Not on file  Social Connections:   . Frequency of Communication with Friends and Family: Not on file  . Frequency of Social Gatherings with Friends and Family: Not on file  . Attends Religious Services: Not on  file  . Active Member of Clubs or Organizations: Not on file  . Attends Archivist Meetings: Not on file  . Marital Status: Not on file  Intimate Partner Violence:   . Fear  of Current or Ex-Partner: Not on file  . Emotionally Abused: Not on file  . Physically Abused: Not on file  . Sexually Abused: Not on file    Review of Systems: See HPI, otherwise normal ROS  Physical Exam: Temp:  [99.2 F (37.3 C)-99.3 F (37.4 C)] 99.3 F (37.4 C) (03/10 0500) Pulse Rate:  [77-82] 77 (03/10 0500) Resp:  [16] 16 (03/10 0500) BP: (139-156)/(63-73) 139/63 (03/10 0500) SpO2:  [91 %-92 %] 92 % (03/10 0836) Last BM Date: 07/07/19  Patient is alert and in no acute distress Conjunctiva is pink.  Sclera is nonicteric. Oropharyngeal mucosa is normal.  She is edentulous. Neck without masses or thyromegaly. She has a pacemaker in left pectoral region. Cardiac exam with regular rhythm normal S1 and S2.  She has grade 2/6 systolic murmur best heard at aortic area. Auscultation of lungs reveal vesicular breath sounds bilaterally. Abdomen is to [place] with extensive scarring across lower abdomen.  Bowel sounds are normal.  She has mild tenderness right mid abdomen without guarding or rebound.  No organomegaly or masses. She has stasis dermatitis or pigmentation of skin of both legs but there is no peripheral edema.  She does not have clubbing or koilonychia.  Lab Results: Recent Labs    07/07/19 0538 07/09/19 0514  WBC 7.9 6.9  HGB 12.8 12.5  HCT 40.9 38.7  PLT 239 229   BMET Recent Labs    07/07/19 0538 07/09/19 0514  NA 136 136  K 3.7 2.9*  CL 101 97*  CO2 26 30  GLUCOSE 96 98  BUN 15 8  CREATININE 0.61 0.65  CALCIUM 8.2* 8.2*    Studies/Results: CT ABDOMEN PELVIS W CONTRAST  Result Date: 07/08/2019 CLINICAL DATA:  Bowel obstruction suspected, gallstone ileus, nausea and vomiting EXAM: CT ABDOMEN AND PELVIS WITH CONTRAST TECHNIQUE: Multidetector CT imaging of the abdomen and pelvis was performed using the standard protocol following bolus administration of intravenous contrast. CONTRAST:  172mL OMNIPAQUE IOHEXOL 300 MG/ML SOLN, additional oral enteric  contrast COMPARISON:  07/04/2019 FINDINGS: Lower chest: New small bilateral pleural effusions and associated atelectasis or consolidation. Cardiomegaly with aortic valve stent endograft. Coronary artery calcifications. Hepatobiliary: No solid liver abnormality is seen. Redemonstrated biliary ductal dilatation and pneumobilia, with faintly calcified gallstones in the gallbladder. Unchanged gallbladder wall thickening and trace pericholecystic fluid. Pancreas: Unremarkable. No pancreatic ductal dilatation or surrounding inflammatory changes. Spleen: Normal in size without significant abnormality. Adrenals/Urinary Tract: Adrenal glands are unremarkable. Kidneys are normal, without renal calculi, solid lesion, or hydronephrosis. Bladder is unremarkable. Stomach/Bowel: Stomach is within normal limits. Appendix appears normal. The small bowel is diffusely fluid and contrast filled, bowel loops not overly distended, measuring no greater than 3.3 cm in caliber. There are mildly inflamed appearing loops of distal ileum (e.g. Series 2, image 56). The colon is generally decompressed although there is scattered gas and stool present to the rectum. A previously seen rim calcified gallstone in the distal small bowel is now present in the rectum (series 2, image 86). Vascular/Lymphatic: Aortic atherosclerosis. No enlarged abdominal or pelvic lymph nodes. Reproductive: No mass or other significant abnormality. Other: No abdominal wall hernia or abnormality. Mild anasarca. Small volume fluid in the right lower quadrant unchanged compared to prior examination. Musculoskeletal: No acute or  significant osseous findings. IMPRESSION: 1. The small bowel is diffusely fluid and contrast filled, although bowel loops not overtly distended to suggest high-grade obstruction. There are mildly inflamed loops of distal ileum, suggesting nonspecific infectious, inflammatory, or ischemic enteritis. There may be some degree of ileus. 2. A previously  seen rim calcified gallstone in the distal small bowel is now present in the rectum. 3. Unchanged biliary ductal dilatation and pneumobilia with faintly calcified gallstones in the gallbladder. Unchanged gallbladder wall thickening and trace pericholecystic fluid. 4. Unchanged small volume fluid in the right lower quadrant. 5. New small bilateral pleural effusions and associated atelectasis or consolidation. 6. Aortic Atherosclerosis (ICD10-I70.0). Electronically Signed   By: Lauralyn Primes M.D.   On: 07/08/2019 14:34   I have reviewed CT from 07/04/2019 and CT from 07/08/2019 as well as ultrasound from 07/05/2019.  Assessment;  Patient is 79 year old Caucasian female with multiple comorbidities who was being treated for gallstone ileus who experienced large-volume painful hematochezia last night.  She has not passed any more blood per rectum.   I suspect rectal bleeding resulted from anorectal injury and passing large stone with calcified rim.  The stone was documented to have moved from small bowel into the rectum on sequential imaging studies.  Patient does not have perianal pain or painful defecation.  Therefore I doubt that she has anal fissure. Her H&H remained stable.  No indication for endoscopy at this time. Will request colonoscopy records from [place].   Recommendations;  Request colonoscopy records from Dr. Leone Brand office in [place]. Will consider sigmoidoscopy only if bleeding recurs.   LOS: 4 days   Lakya Schrupp  07/09/2019, 4:32 PM

## 2019-07-09 NOTE — Progress Notes (Addendum)
Subjective: Patient is well appearing and has no abdominal complaints. Patient states she had one bowel movement with hematochezia that felt like "she was passing a gumball". She has since had two additional bowel movements without blood in the stool. Patient denies any nausea, or pain on defecation.  Objective: Vital signs in last 24 hours: Temp:  [98.8 F (37.1 C)-99.3 F (37.4 C)] 99.3 F (37.4 C) (03/10 0500) Pulse Rate:  [77-82] 77 (03/10 0500) Resp:  [16-20] 16 (03/10 0500) BP: (139-161)/(63-80) 139/63 (03/10 0500) SpO2:  [89 %-97 %] 91 % (03/10 0500) Last BM Date: 07/07/19  Intake/Output from previous day: 03/09 0701 - 03/10 0700 In: 600 [P.O.:600] Out: 3150 [Urine:3150] Intake/Output this shift: No intake/output data recorded.  General appearance: alert, cooperative and no distress Resp: clear to auscultation bilaterally Cardio: regular rate and rhythm, S1, S2 normal, no murmur, click, rub or gallop GI: Soft, non tender to palpation. Bowel signs present. no rigidity present.  Lab Results:  Recent Labs    07/07/19 0538 07/09/19 0514  WBC 7.9 6.9  HGB 12.8 12.5  HCT 40.9 38.7  PLT 239 229   BMET Recent Labs    07/07/19 0538 07/09/19 0514  NA 136 136  K 3.7 2.9*  CL 101 97*  CO2 26 30  GLUCOSE 96 98  BUN 15 8  CREATININE 0.61 0.65  CALCIUM 8.2* 8.2*   PT/INR No results for input(s): LABPROT, INR in the last 72 hours.  Studies/Results: CT ABDOMEN PELVIS W CONTRAST  Result Date: 07/08/2019 CLINICAL DATA:  Bowel obstruction suspected, gallstone ileus, nausea and vomiting EXAM: CT ABDOMEN AND PELVIS WITH CONTRAST TECHNIQUE: Multidetector CT imaging of the abdomen and pelvis was performed using the standard protocol following bolus administration of intravenous contrast. CONTRAST:  175mL OMNIPAQUE IOHEXOL 300 MG/ML SOLN, additional oral enteric contrast COMPARISON:  07/04/2019 FINDINGS: Lower chest: New small bilateral pleural effusions and associated  atelectasis or consolidation. Cardiomegaly with aortic valve stent endograft. Coronary artery calcifications. Hepatobiliary: No solid liver abnormality is seen. Redemonstrated biliary ductal dilatation and pneumobilia, with faintly calcified gallstones in the gallbladder. Unchanged gallbladder wall thickening and trace pericholecystic fluid. Pancreas: Unremarkable. No pancreatic ductal dilatation or surrounding inflammatory changes. Spleen: Normal in size without significant abnormality. Adrenals/Urinary Tract: Adrenal glands are unremarkable. Kidneys are normal, without renal calculi, solid lesion, or hydronephrosis. Bladder is unremarkable. Stomach/Bowel: Stomach is within normal limits. Appendix appears normal. The small bowel is diffusely fluid and contrast filled, bowel loops not overly distended, measuring no greater than 3.3 cm in caliber. There are mildly inflamed appearing loops of distal ileum (e.g. Series 2, image 56). The colon is generally decompressed although there is scattered gas and stool present to the rectum. A previously seen rim calcified gallstone in the distal small bowel is now present in the rectum (series 2, image 86). Vascular/Lymphatic: Aortic atherosclerosis. No enlarged abdominal or pelvic lymph nodes. Reproductive: No mass or other significant abnormality. Other: No abdominal wall hernia or abnormality. Mild anasarca. Small volume fluid in the right lower quadrant unchanged compared to prior examination. Musculoskeletal: No acute or significant osseous findings. IMPRESSION: 1. The small bowel is diffusely fluid and contrast filled, although bowel loops not overtly distended to suggest high-grade obstruction. There are mildly inflamed loops of distal ileum, suggesting nonspecific infectious, inflammatory, or ischemic enteritis. There may be some degree of ileus. 2. A previously seen rim calcified gallstone in the distal small bowel is now present in the rectum. 3. Unchanged biliary  ductal dilatation and pneumobilia  with faintly calcified gallstones in the gallbladder. Unchanged gallbladder wall thickening and trace pericholecystic fluid. 4. Unchanged small volume fluid in the right lower quadrant. 5. New small bilateral pleural effusions and associated atelectasis or consolidation. 6. Aortic Atherosclerosis (ICD10-I70.0). Electronically Signed   By: Lauralyn Primes M.D.   On: 07/08/2019 14:34   DG Abd 2 Views  Result Date: 07/07/2019 CLINICAL DATA:  Generalized abdominal pain. EXAM: ABDOMEN - 2 VIEW COMPARISON:  None. FINDINGS: Dilated small bowel loops are noted concerning for distal small bowel obstruction or ileus. No colonic dilatation is noted. There is no evidence of free air. No radio-opaque calculi or other significant radiographic abnormality is seen. IMPRESSION: Dilated small bowel loops are noted concerning for distal small bowel obstruction or ileus. Follow-up radiographs are recommended. Electronically Signed   By: Lupita Raider M.D.   On: 07/07/2019 13:30    Anti-infectives: Anti-infectives (From admission, onward)   Start     Dose/Rate Route Frequency Ordered Stop   07/07/19 0145  ciprofloxacin (CIPRO) tablet 500 mg  Status:  Discontinued     500 mg Oral 2 times daily 07/07/19 0135 07/08/19 1446   07/07/19 0145  metroNIDAZOLE (FLAGYL) tablet 500 mg  Status:  Discontinued     500 mg Oral Every 8 hours 07/07/19 0135 07/08/19 1446   07/05/19 1000  ciprofloxacin (CIPRO) IVPB 400 mg  Status:  Discontinued     400 mg 200 mL/hr over 60 Minutes Intravenous Every 12 hours 07/05/19 0345 07/07/19 0134   07/05/19 0600  metroNIDAZOLE (FLAGYL) IVPB 500 mg  Status:  Discontinued     500 mg 100 mL/hr over 60 Minutes Intravenous Every 8 hours 07/05/19 0345 07/07/19 0134   07/04/19 2330  ciprofloxacin (CIPRO) IVPB 400 mg     400 mg 200 mL/hr over 60 Minutes Intravenous  Once 07/04/19 2326 07/05/19 0120   07/04/19 2330  metroNIDAZOLE (FLAGYL) IVPB 500 mg     500 mg 100 mL/hr  over 60 Minutes Intravenous  Once 07/04/19 2326 07/05/19 0229      Assessment/Plan: Impression: Gallstone ileus, resolved. Patient appears to have passed gallstone late last night. Bowel pain has been resolved. Patient has had recurrent bowel movements.  Plan: There is no need for surgical intervention at this time. Given patients regular bowel movements and passing of the gallstone in stool without abdominal pain or discomfort, there is no present need for our follow-up. We will discontinue our services.  LOS: 4 days    Eliezer Lofts 07/09/2019   Agree with above.  No need for exploratory laparotomy now.  Cholecystectomy with possible repair of bowel probably too extensive for patient to tolerate.  Blood per rectum probably secondary to passing stone, enteritis.  Discussed with Dr. Karilyn Cota.

## 2019-07-09 NOTE — Progress Notes (Signed)
PROGRESS NOTE    Dawn Gonzales  GOT:[id number] DOB: April 04, 1941 DOA: 07/04/2019 PCP: Benita Stabile, MD    Brief Narrative:  79 year old female with a history of chronic diastolic heart failure, severe aortic stenosis status post TAVR, history of complete heart block status post pacemaker, presents to the hospital with nausea, vomiting and abdominal pain.  CT scan indicated pneumobilia as well as small bowel enteritis consistent with possible ileus.  She was seen by general surgery and noted to have a gallstone ileus.  There was a fistulous tract from the gallbladder to the small bowel.  Plans are for possible operative management.   Assessment & Plan:   Principal Problem:   Gallstone ileus (HCC) Active Problems:   Severe aortic stenosis   Obesity, Class III, BMI 40-49.9 (morbid obesity) (HCC)   Chronic diastolic CHF (congestive heart failure), NYHA class 2 (HCC)   S/P TAVR (transcatheter aortic valve replacement)   Enteritis   Complete heart block (HCC)   History of cardiac pacemaker   Cholelithiasis   1. Gallstone ileus.  Appreciate general surgery assistance.  Patient had bloody BMs, CT scan was repeated today and it appears that gallstone ileus has resolved.  Diet is being advanced.  Discontinued antibiotics.  No indication for surgery at this time. Patient had bloody BMs x 3 episodes discussed with general surgery and GI service, observe overnight, if no further bloody BM and H&H is stable and patient tolerating oral intake well may discharge home on 07/10/2019 -Per GI service will consider endoluminal evaluation if further bloody BMs  2. Chronic diastolic heart failure. -Appears euvolemic after IV Lasix,.   3. History of severe aortic stenosis, status post TAVR--stable,   4)history of complete heart block status post pacemaker--- stable,  5)Morbid obesity.  Would benefit from weight loss  6)Preoperative evaluation.  Appreciate cardiology input for preop cardiac  evaluation.    DVT prophylaxis: Stop Lovenox due to concerns about GI bleed and use SCDs instead Code Status: Full code Family Communication: Previously discussed with daughter over the phone  4. Disposition Plan:  --Episodes of bloody BM x3,  Patient had bloody BMs x 3 episodes discussed with general surgery and GI service, observe overnight, if no further bloody BM and H&H is stable and patient tolerating oral intake well may discharge home on 07/10/2019   Consultants:   General surgery  Cardiology  GI  Procedures:   Echo:1. Left ventricular ejection fraction, by estimation, is 60 to 65%. The  left ventricle has normal function. The left ventricle has no regional  wall motion abnormalities. There is mild left ventricular hypertrophy.  Left ventricular diastolic parameters  are indeterminate.  2. Right ventricular systolic function is normal. The right ventricular  size is normal. Tricuspid regurgitation signal is inadequate for assessing  PA pressure.  3. Left atrial size was severely dilated.  4. Right atrial size was mildly dilated.  5. The mitral valve is degenerative with mild thickening and  calcification associated with severe annular calcification. Mild mitral  valve regurgitation. Mild mitral stenosis. The mean mitral valve gradient  is 4.0 mmHg.  6. The aortic valve has been repaired/replaced. There is a 26 mm Edwards  Sapien 3 prosthesis in position. Aortic valve regurgitation is mild to  moderate - perivalvular near septum. Stable mean gradient of 19 mmHg.  7. The inferior vena cava is dilated in size with >50% respiratory  variability, suggesting right atrial pressure of 8 mmHg.  Antimicrobials:   Cipro 3/6 > 3/9  Flagyl 3/6 > 3/9   Subjective:  Patient had bloody BMs x 3 episodes   -No chest pains no dizziness -No emesis  Objective: Vitals:   07/08/19 1511 07/08/19 2208 07/09/19 0500 07/09/19 0836  BP: (!) 161/80 (!) 156/73 139/63   Pulse: 79  82 77   Resp: 20 16 16    Temp: 98.8 F (37.1 C) 99.2 F (37.3 C) 99.3 F (37.4 C)   TempSrc: Oral Oral Oral   SpO2: (!) 89% 92% 91% 92%  Weight:      Height:        Intake/Output Summary (Last 24 hours) at 07/09/2019 1952 Last data filed at 07/09/2019 1305 Gross per 24 hour  Intake 480 ml  Output 1100 ml  Net -620 ml   Filed Weights   07/04/19 1741  Weight: 121.6 kg    Examination:  General exam: Appears calm and comfortable  Respiratory system: Crackles at bases. Respiratory effort normal. Cardiovascular system: S1 & S2 heard, RRR. No JVD, murmurs, rubs, gallops or clicks.  Gastrointestinal system: Abdomen is nondistended, soft and mildly tender throughout abdomen.  Normal bowel sounds heard. Central nervous system: Alert and oriented. No focal neurological deficits. Extremities: 1+ edema bilaterally Skin: No rashes, lesions or ulcers Psychiatry: Judgement and insight appear normal. Mood & affect appropriate.   Data Reviewed:    CBC: Recent Labs  Lab 07/04/19 2000 07/05/19 0709 07/07/19 0538 07/09/19 0514  WBC 12.9* 11.8* 7.9 6.9  NEUTROABS 10.3*  --   --   --   HGB 14.0 13.0 12.8 12.5  HCT 45.3 42.4 40.9 38.7  MCV 95.8 96.1 94.7 91.9  PLT 265 272 239 229   Basic Metabolic Panel: Recent Labs  Lab 07/04/19 2000 07/05/19 0709 07/07/19 0538 07/09/19 0514  NA 138 138 136 136  K 4.4 4.1 3.7 2.9*  CL 96* 98 101 97*  CO2 34* 32 26 30  GLUCOSE 115* 109* 96 98  BUN 27* 24* 15 8  CREATININE 0.81 0.68 0.61 0.65  CALCIUM 9.4 8.9 8.2* 8.2*   GFR: Estimated Creatinine Clearance: 77 mL/min (by C-G formula based on SCr of 0.65 mg/dL). Liver Function Tests: Recent Labs  Lab 07/04/19 2000 07/05/19 0709  AST 18 16  ALT 11 10  ALKPHOS 69 64  BILITOT 0.5 0.8  PROT 7.4 6.5  ALBUMIN 3.6 3.1*   Recent Labs  Lab 07/04/19 2000  LIPASE 14   No results for input(s): AMMONIA in the last 168 hours. Coagulation Profile: No results for input(s): INR, PROTIME in  the last 168 hours. Cardiac Enzymes: No results for input(s): CKTOTAL, CKMB, CKMBINDEX, TROPONINI in the last 168 hours. BNP (last 3 results) No results for input(s): PROBNP in the last 8760 hours. HbA1C: No results for input(s): HGBA1C in the last 72 hours. CBG: No results for input(s): GLUCAP in the last 168 hours. Lipid Profile: No results for input(s): CHOL, HDL, LDLCALC, TRIG, CHOLHDL, LDLDIRECT in the last 72 hours. Thyroid Function Tests: No results for input(s): TSH, T4TOTAL, FREET4, T3FREE, THYROIDAB in the last 72 hours. Anemia Panel: No results for input(s): VITAMINB12, FOLATE, FERRITIN, TIBC, IRON, RETICCTPCT in the last 72 hours. Sepsis Labs: No results for input(s): PROCALCITON, LATICACIDVEN in the last 168 hours.  Recent Results (from the past 240 hour(s))  Respiratory Panel by RT PCR (Flu A&B, Covid) - Nasopharyngeal Swab     Status: None   Collection Time: 07/04/19  9:45 PM   Specimen: Nasopharyngeal Swab  Result Value Ref Range Status  SARS Coronavirus 2 by RT PCR NEGATIVE NEGATIVE Final    Comment: (NOTE) SARS-CoV-2 target nucleic acids are NOT DETECTED. The SARS-CoV-2 RNA is generally detectable in upper respiratoy specimens during the acute phase of infection. The lowest concentration of SARS-CoV-2 viral copies this assay can detect is 131 copies/mL. A negative result does not preclude SARS-Cov-2 infection and should not be used as the sole basis for treatment or other patient management decisions. A negative result may occur with  improper specimen collection/handling, submission of specimen other than nasopharyngeal swab, presence of viral mutation(s) within the areas targeted by this assay, and inadequate number of viral copies (<131 copies/mL). A negative result must be combined with clinical observations, patient history, and epidemiological information. The expected result is Negative. Fact Sheet for Patients:    https://www.moore.com/ Fact Sheet for Healthcare Providers:  https://www.young.biz/ This test is not yet ap proved or cleared by the [place] FDA and  has been authorized for detection and/or diagnosis of SARS-CoV-2 by FDA under an Emergency Use Authorization (EUA). This EUA will remain  in effect (meaning this test can be used) for the duration of the COVID-19 declaration under Section 564(b)(1) of the Act, 21 U.S.C. section 360bbb-3(b)(1), unless the authorization is terminated or revoked sooner.    Influenza A by PCR NEGATIVE NEGATIVE Final   Influenza B by PCR NEGATIVE NEGATIVE Final    Comment: (NOTE) The Xpert Xpress SARS-CoV-2/FLU/RSV assay is intended as an aid in  the diagnosis of influenza from Nasopharyngeal swab specimens and  should not be used as a sole basis for treatment. Nasal washings and  aspirates are unacceptable for Xpert Xpress SARS-CoV-2/FLU/RSV  testing. Fact Sheet for Patients: https://www.moore.com/ Fact Sheet for Healthcare Providers: https://www.young.biz/ This test is not yet approved or cleared by the [place] FDA and  has been authorized for detection and/or diagnosis of SARS-CoV-2 by  FDA under an Emergency Use Authorization (EUA). This EUA will remain  in effect (meaning this test can be used) for the duration of the  Covid-19 declaration under Section 564(b)(1) of the Act, 21  U.S.C. section 360bbb-3(b)(1), unless the authorization is  terminated or revoked. Performed at [facility], [address], [place], [place] [address]      Radiology Studies: CT ABDOMEN PELVIS W CONTRAST  Result Date: 07/08/2019 CLINICAL DATA:  Bowel obstruction suspected, gallstone ileus, nausea and vomiting EXAM: CT ABDOMEN AND PELVIS WITH CONTRAST TECHNIQUE: Multidetector CT imaging of the abdomen and pelvis was performed using the standard protocol following bolus administration of  intravenous contrast. CONTRAST:  OMNIPAQUE IOHEXOL 300 MG/ML SOLN, additional oral enteric contrast COMPARISON:  07/04/2019 FINDINGS: Lower chest: New small bilateral pleural effusions and associated atelectasis or consolidation. Cardiomegaly with aortic valve stent endograft. Coronary artery calcifications. Hepatobiliary: No solid liver abnormality is seen. Redemonstrated biliary ductal dilatation and pneumobilia, with faintly calcified gallstones in the gallbladder. Unchanged gallbladder wall thickening and trace pericholecystic fluid. Pancreas: Unremarkable. No pancreatic ductal dilatation or surrounding inflammatory changes. Spleen: Normal in size without significant abnormality. Adrenals/Urinary Tract: Adrenal glands are unremarkable. Kidneys are normal, without renal calculi, solid lesion, or hydronephrosis. Bladder is unremarkable. Stomach/Bowel: Stomach is within normal limits. Appendix appears normal. The small bowel is diffusely fluid and contrast filled, bowel loops not overly distended, measuring no greater than 3.3 cm in caliber. There are mildly inflamed appearing loops of distal ileum (e.g. Series 2, image 56). The colon is generally decompressed although there is scattered gas and stool present to the rectum. A previously  seen rim calcified gallstone in the distal small bowel is now present in the rectum (series 2, image 86). Vascular/Lymphatic: Aortic atherosclerosis. No enlarged abdominal or pelvic lymph nodes. Reproductive: No mass or other significant abnormality. Other: No abdominal wall hernia or abnormality. Mild anasarca. Small volume fluid in the right lower quadrant unchanged compared to prior examination. Musculoskeletal: No acute or significant osseous findings. IMPRESSION: 1. The small bowel is diffusely fluid and contrast filled, although bowel loops not overtly distended to suggest high-grade obstruction. There are mildly inflamed loops of distal ileum, suggesting nonspecific  infectious, inflammatory, or ischemic enteritis. There may be some degree of ileus. 2. A previously seen rim calcified gallstone in the distal small bowel is now present in the rectum. 3. Unchanged biliary ductal dilatation and pneumobilia with faintly calcified gallstones in the gallbladder. Unchanged gallbladder wall thickening and trace pericholecystic fluid. 4. Unchanged small volume fluid in the right lower quadrant. 5. New small bilateral pleural effusions and associated atelectasis or consolidation. 6. Aortic Atherosclerosis (ICD10-I70.0). Electronically Signed   By: Eddie Candle M.D.   On: 07/08/2019 14:34    Scheduled Meds: . aspirin EC  81 mg Oral Daily  . enoxaparin (LOVENOX) injection  60 mg Subcutaneous Q24H  . gabapentin  300 mg Oral TID  . nystatin   Topical TID  . pravastatin  20 mg Oral QHS  . venlafaxine  37.5 mg Oral Daily   Continuous Infusions:   LOS: 4 days   Roxan Hockey, MD [facility]  If 7PM-7AM, please contact night-coverage [email or web address]  07/09/2019, 7:52 PM

## 2019-07-09 NOTE — Progress Notes (Signed)
Patient has had two liquid bowel movements at this time in shift.  During each bowel movement, patient has had frank blood mixed in liquid stool.  Notified MD of patient development.  Received order.  Patient has had no other complaints at this time.  Will continue to monitor patient.

## 2019-07-10 LAB — CBC
HCT: 41 % (ref 36.0–46.0)
Hemoglobin: 13 g/dL (ref 12.0–15.0)
MCH: 29.6 pg (ref 26.0–34.0)
MCHC: 31.7 g/dL (ref 30.0–36.0)
MCV: 93.4 fL (ref 80.0–100.0)
Platelets: 245 10*3/uL (ref 150–400)
RBC: 4.39 MIL/uL (ref 3.87–5.11)
RDW: 15.4 % (ref 11.5–15.5)
WBC: 6.4 10*3/uL (ref 4.0–10.5)
nRBC: 0 % (ref 0.0–0.2)

## 2019-07-10 MED ORDER — ACETAMINOPHEN 325 MG PO TABS: 650.0000 mg | ORAL_TABLET | Freq: Four times a day (QID) | ORAL | 0 refills | Status: AC | PRN

## 2019-07-10 MED ORDER — POTASSIUM CHLORIDE CRYS ER 20 MEQ PO TBCR
40.0000 meq | EXTENDED_RELEASE_TABLET | ORAL | Status: DC
  Administered 2019-07-10: 40 meq via ORAL
  Filled 2019-07-10: qty 2

## 2019-07-10 MED ORDER — POLYETHYLENE GLYCOL 3350 17 G PO PACK: 17.0000 g | PACK | Freq: Every day | ORAL | 0 refills | Status: DC | PRN

## 2019-07-10 MED ORDER — ASPIRIN EC 81 MG PO TBEC: 81.0000 mg | DELAYED_RELEASE_TABLET | Freq: Every day | ORAL | 0 refills | Status: DC

## 2019-07-10 MED ORDER — PRAVASTATIN SODIUM 20 MG PO TABS: 20.0000 mg | ORAL_TABLET | Freq: Every day | ORAL | 3 refills | Status: DC

## 2019-07-10 MED ORDER — PANTOPRAZOLE SODIUM 40 MG PO TBEC: 40.0000 mg | DELAYED_RELEASE_TABLET | Freq: Every day | ORAL | 1 refills | Status: DC

## 2019-07-10 NOTE — Progress Notes (Signed)
Patient has had no episodes of rectal bleeding.  Patient did have a nose bleed, which lasted only a few minutes.  Patient has been stable. Vitals stable.

## 2019-07-10 NOTE — Discharge Summary (Signed)
Dawn Gonzales, is a 79 y.o. female  DOB 11/15/1940  MRN [id number].  Admission date:  07/04/2019  Admitting Physician  Meredeth Ide, MD  Discharge Date:  07/10/2019   Primary MD  Benita Stabile, MD  Recommendations for primary care physician for things to follow:   Avoid ibuprofen/Advil/Aleve/Motrin/Goody Powders/Naproxen/BC powders/Meloxicam/Diclofenac/Indomethacin and other Nonsteroidal anti-inflammatory medications as these will make you more likely to bleed and can cause stomach ulcers, can also cause Kidney problems.   Admission Diagnosis  Enteritis [K52.9] Ileus (HCC) [K56.7] Acute respiratory failure with hypoxia (HCC) [J96.01]   Discharge Diagnosis  Enteritis [K52.9] Ileus (HCC) [K56.7] Acute respiratory failure with hypoxia (HCC) [J96.01]    Principal Problem:   Gallstone ileus (HCC) Active Problems:   Chronic diastolic CHF (congestive heart failure), NYHA class 2 (HCC)   S/P TAVR (transcatheter aortic valve replacement)   History of cardiac pacemaker   Severe aortic stenosis   Obesity, Class III, BMI 40-49.9 (morbid obesity) (HCC)   Hypertension   Enteritis   Complete heart block (HCC)   Cholelithiasis      Past Medical History:  Diagnosis Date  . Anemia   . Anxiety   . Arthritis   . Bilateral renal cysts   . Cholecystitis   . Chronic diastolic heart failure (HCC)   . Depression   . Essential hypertension   . History of cellulitis   . History of CVA (cerebrovascular accident)   . History of endocarditis   . Hyperlipemia   . Morbid obesity (HCC)   . Neuropathy   . Right bundle branch block (RBBB)   . 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 Severe aortic stenosis   . Sleep apnea     Past Surgical History:  Procedure Laterality Date  .  Abdominal gangrene    . ADENOIDECTOMY    . Cataract surgery    . COLONOSCOPY    . ENDOSCOPIC RETROGRADE CHOLANGIOPANCREATOGRAPHY (ERCP) WITH PROPOFOL N/A 02/08/2018   Procedure: ENDOSCOPIC RETROGRADE CHOLANGIOPANCREATOGRAPHY (ERCP) WITH PROPOFOL;  Surgeon: Meridee Score Netty Starring., MD;  Location: [facility] ENDOSCOPY;  Service: Gastroenterology;  Laterality: N/A;  . EUS  02/08/2018   Procedure: UPPER ENDOSCOPIC ULTRASOUND (EUS) LINEAR;  Surgeon: Lemar Lofty., MD;  Location: [facility] ENDOSCOPY;  Service: Gastroenterology;;  . EYE SURGERY    . IR FLUORO RM 30-60 MIN  03/27/2018  . IR PERC CHOLECYSTOSTOMY  02/09/2018  . IR RADIOLOGIST EVAL & MGMT  03/26/2018  . IR RADIOLOGY PERIPHERAL GUIDED IV START  05/22/2018  . IR RADIOLOGY PERIPHERAL GUIDED IV START  05/30/2018  . IR RADIOLOGY PERIPHERAL GUIDED IV START  06/05/2018  . IR US GUIDE VASC ACCESS RIGHT  05/22/2018  . IR US GUIDE VASC ACCESS RIGHT  05/30/2018  . IR US GUIDE VASC ACCESS RIGHT  06/05/2018  . PACEMAKER IMPLANT N/A 07/10/2018   Procedure: PACEMAKER IMPLANT;  Surgeon: Marinus Maw, MD;  Location: [facility] INVASIVE  CV LAB;  Service: Cardiovascular;  Laterality: N/A;  . REMOVAL OF STONES  02/08/2018   Procedure: REMOVAL OF STONES;  Surgeon: Lemar Lofty., MD;  Location: [facility] ENDOSCOPY;  Service: Gastroenterology;;  . Dennison Mascot  02/08/2018   Procedure: Dennison Mascot;  Surgeon: Mansouraty, Netty Starring., MD;  Location: [facility] ENDOSCOPY;  Service: Gastroenterology;;  . TONSILLECTOMY    . TOOTH EXTRACTION N/A 06/18/2018   Procedure: DENTAL RESTORATION/EXTRACTIONS;  Surgeon: Ocie Doyne, DDS;  Location: [facility] OR;  Service: Oral Surgery;  Laterality: N/A;  . TRANSCATHETER AORTIC VALVE REPLACEMENT, TRANSFEMORAL N/A 07/09/2018   Procedure: TRANSCATHETER AORTIC VALVE REPLACEMENT, TRANSFEMORAL;  Surgeon: Tonny Bollman, MD;  Location: [facility] INVASIVE CV LAB;  Service: Cardiovascular;  Laterality: N/A;  . Tummy tuck         HPI  from the history and  physical done on the day of admission:         Hospital Course:     Brief Narrative:  79 year old female with a history of chronic diastolic heart failure, severe aortic stenosis status post TAVR, history of complete heart block status post pacemaker, presents to the hospital with nausea, vomiting and abdominal pain.  CT scan indicated pneumobilia as well as small bowel enteritis consistent with possible ileus.  She was seen by general surgery and noted to have a gallstone ileus.  There was a fistulous tract from the gallbladder to the small bowel.  Plans are for possible operative management.   Assessment & Plan:   Principal Problem:   Gallstone ileus (HCC) Active Problems:   Severe aortic stenosis   Obesity, Class III, BMI 40-49.9 (morbid obesity) (HCC)   Chronic diastolic CHF (congestive heart failure), NYHA class 2 (HCC)   S/P TAVR (transcatheter aortic valve replacement)   Enteritis   Complete heart block (HCC)   History of cardiac pacemaker   Cholelithiasis   1. Gallstone ileus--- history of cholecystitis treated with cholecystostomy on 2 occasions status post ERCP with sphincterotomy and stone extraction in October 2019 Appreciate general surgery assistance.  Patient had bloody BMs, CT scan was repeated  and it appears that gallstone ileus has resolved.  Diet is being advanced.  Discontinued antibiotics.  No indication for surgery at this time. Patient had bloody BMs x 3 episodes,  discussed with general surgery and GI service, patient was observed overnight for additional day,  no further bloody BM and H&H is stable and patient tolerating oral intake so discharge home on 07/10/2019 -Per GI service will consider endoluminal evaluation if further bloody BMs  2. Chronic diastolic heart failure. -Appears euvolemic after IV Lasix,.   3.  4. History of severe aortic stenosis, status post TAVR--stable,   4)history of complete heart block status post pacemaker---  stable,  5)Morbid obesity. Would benefit from weight loss  6)Preoperative evaluation.  Appreciate cardiology input for preop cardiac evaluation.   Code Status: Full code Family Communication: Previously discussed with daughter over the phone  Disposition Plan:  --Discharged home in stable condition Consultants:   General surgery  Cardiology  GI  Procedures:   Echo:1. Left ventricular ejection fraction, by estimation, is 60 to 65%. The  left ventricle has normal function. The left ventricle has no regional  wall motion abnormalities. There is mild left ventricular hypertrophy.  Left ventricular diastolic parameters  are indeterminate.  2. Right ventricular systolic function is normal. The right ventricular  size is normal. Tricuspid regurgitation signal is inadequate for assessing  PA pressure.  3. Left atrial size was severely dilated.  4. Right atrial size was mildly dilated.  5. The mitral valve is degenerative with mild thickening and  calcification associated with severe annular calcification. Mild mitral  valve regurgitation. Mild mitral stenosis. The mean mitral valve gradient  is 4.0 mmHg.  6. The aortic valve has been repaired/replaced. There is a 26 mm Edwards  Sapien 3 prosthesis in position. Aortic valve regurgitation is mild to  moderate - perivalvular near septum. Stable mean gradient of 19 mmHg.  7. The inferior vena cava is dilated in size with >50% respiratory  variability, suggesting right atrial pressure of 8 mmHg.  Antimicrobials:   Cipro 3/6 > 3/9  Flagyl 3/6 > 3/9  Discharge Condition: stable  Follow UP--- general surgery as needed abdominal pain  Diet and Activity recommendation:  As advised  Discharge Instructions    Discharge Instructions    Call MD for:  difficulty breathing, headache or visual disturbances   Complete by: As directed    Call MD for:  extreme fatigue   Complete by: As directed    Call MD for:  persistant  dizziness or light-headedness   Complete by: As directed    Call MD for:  persistant nausea and vomiting   Complete by: As directed    Call MD for:  severe uncontrolled pain   Complete by: As directed    Call MD for:  temperature >100.4   Complete by: As directed    Diet - low sodium heart healthy   Complete by: As directed    Discharge instructions   Complete by: As directed    1)Avoid ibuprofen/Advil/Aleve/Motrin/Goody Powders/Naproxen/BC powders/Meloxicam/Diclofenac/Indomethacin and other Nonsteroidal anti-inflammatory medications as these will make you more likely to bleed and can cause stomach ulcers, can also cause Kidney problems.   2) avoid constipation   Increase activity slowly   Complete by: As directed        Discharge Medications     Allergies as of 07/10/2019   No Known Allergies     Medication List    STOP taking these medications   acetaminophen 650 MG CR tablet Commonly known as: TYLENOL Replaced by: acetaminophen 325 MG tablet     TAKE these medications   acetaminophen 325 MG tablet Commonly known as: TYLENOL Take 2 tablets (650 mg total) by mouth every 6 (six) hours as needed for mild pain, fever or headache (or Fever >/= 101). Replaces: acetaminophen 650 MG CR tablet   aspirin EC 81 MG tablet Take 1 tablet (81 mg total) by mouth daily with breakfast. What changed: when to take this   CALCIUM-CARB 600 PO Take 600 mg by mouth daily.   cholecalciferol 1000 units tablet Commonly known as: VITAMIN D Take 1,000 Units by mouth daily.   clopidogrel 75 MG tablet Commonly known as: PLAVIX TAKE 1 TABLET BY MOUTH EVERY DAY   ferrous sulfate 325 (65 FE) MG tablet Take 325 mg by mouth daily with breakfast.   gabapentin 300 MG capsule Commonly known as: NEURONTIN Take 300 mg by mouth 3 (three) times daily.   multivitamin with minerals Tabs tablet Take 1 tablet by mouth daily.   pantoprazole 40 MG tablet Commonly known as: Protonix Take 1 tablet  (40 mg total) by mouth daily.   polyethylene glycol 17 g packet Commonly known as: MiraLax Mix-In Pax Take 17 g by mouth daily as needed for mild constipation or moderate constipation.   pravastatin 20 MG tablet Commonly known as: PRAVACHOL Take 1 tablet (20 mg total) by mouth at  bedtime.   silver sulfADIAZINE 1 % cream Commonly known as: SILVADENE Apply 1 application topically 2 (two) times daily.   traMADol 50 MG tablet Commonly known as: ULTRAM Take 50 mg by mouth every 6 (six) hours as needed for moderate pain.   venlafaxine 37.5 MG tablet Commonly known as: EFFEXOR Take 37.5 mg by mouth daily.   vitamin C 500 MG tablet Commonly known as: ASCORBIC ACID Take 500 mg by mouth daily.       Major procedures and Radiology Reports - PLEASE review detailed and final reports for all details, in brief -   US Abdomen Complete  Result Date: 07/05/2019 CLINICAL DATA:  Cholelithiasis and air in the gallbladder on an abdomen and pelvis CT obtained yesterday. EXAM: ABDOMEN ULTRASOUND COMPLETE COMPARISON:  Abdomen and pelvis CT obtained yesterday. Limited abdomen ultrasound dated 02/06/2018. FINDINGS: Gallbladder: Interval diffuse gallbladder wall thickening with a maximum thickness of 7.9 mm. The gallbladder also contains air in the previously seen gallstone, measuring 1.4 cm in maximum diameter. No pericholecystic fluid seen. No sonographic Murphy sign. Common bile duct: Diameter: Approximately 9 mm, difficult to evaluate due to air in the common duct as well as possible common duct calculi. Liver: Intrahepatic biliary dilatation and air. No focal lesion identified. Within normal limits in parenchymal echogenicity. Portal vein is patent on color Doppler imaging with normal direction of blood flow towards the liver. IVC: No abnormality visualized. Pancreas: Obscured by overlying gas.  No visible abnormality. Spleen: Not visualized due to overlying gas and body habitus. Right Kidney: Length: 11.4  cm. Echogenicity within normal limits. No mass or hydronephrosis visualized. Left Kidney: Length: Not visualized due to overlying gas and body habitus. Abdominal aorta: Not visualized due to overlying gas and body habitus. Other findings: None. IMPRESSION: 1. Cholelithiasis with interval diffuse gallbladder wall thickening and air in the gallbladder. This could be an indication of acute or chronic cholecystitis. There was no sonographic Murphy sign. 2. Biliary ductal dilatation with air in the bile ducts making it difficult to exclude common duct stones. 3. Limited examination due to bowel gas and body habitus. Electronically Signed   By: Beckie Salts M.D.   On: 07/05/2019 11:19   CT ABDOMEN PELVIS W CONTRAST  Result Date: 07/08/2019 CLINICAL DATA:  Bowel obstruction suspected, gallstone ileus, nausea and vomiting EXAM: CT ABDOMEN AND PELVIS WITH CONTRAST TECHNIQUE: Multidetector CT imaging of the abdomen and pelvis was performed using the standard protocol following bolus administration of intravenous contrast. CONTRAST:  OMNIPAQUE IOHEXOL 300 MG/ML SOLN, additional oral enteric contrast COMPARISON:  07/04/2019 FINDINGS: Lower chest: New small bilateral pleural effusions and associated atelectasis or consolidation. Cardiomegaly with aortic valve stent endograft. Coronary artery calcifications. Hepatobiliary: No solid liver abnormality is seen. Redemonstrated biliary ductal dilatation and pneumobilia, with faintly calcified gallstones in the gallbladder. Unchanged gallbladder wall thickening and trace pericholecystic fluid. Pancreas: Unremarkable. No pancreatic ductal dilatation or surrounding inflammatory changes. Spleen: Normal in size without significant abnormality. Adrenals/Urinary Tract: Adrenal glands are unremarkable. Kidneys are normal, without renal calculi, solid lesion, or hydronephrosis. Bladder is unremarkable. Stomach/Bowel: Stomach is within normal limits. Appendix appears normal. The small  bowel is diffusely fluid and contrast filled, bowel loops not overly distended, measuring no greater than 3.3 cm in caliber. There are mildly inflamed appearing loops of distal ileum (e.g. Series 2, image 56). The colon is generally decompressed although there is scattered gas and stool present to the rectum. A previously seen rim calcified gallstone in the distal small bowel  is now present in the rectum (series 2, image 86). Vascular/Lymphatic: Aortic atherosclerosis. No enlarged abdominal or pelvic lymph nodes. Reproductive: No mass or other significant abnormality. Other: No abdominal wall hernia or abnormality. Mild anasarca. Small volume fluid in the right lower quadrant unchanged compared to prior examination. Musculoskeletal: No acute or significant osseous findings. IMPRESSION: 1. The small bowel is diffusely fluid and contrast filled, although bowel loops not overtly distended to suggest high-grade obstruction. There are mildly inflamed loops of distal ileum, suggesting nonspecific infectious, inflammatory, or ischemic enteritis. There may be some degree of ileus. 2. A previously seen rim calcified gallstone in the distal small bowel is now present in the rectum. 3. Unchanged biliary ductal dilatation and pneumobilia with faintly calcified gallstones in the gallbladder. Unchanged gallbladder wall thickening and trace pericholecystic fluid. 4. Unchanged small volume fluid in the right lower quadrant. 5. New small bilateral pleural effusions and associated atelectasis or consolidation. 6. Aortic Atherosclerosis (ICD10-I70.0). Electronically Signed   By: Lauralyn Primes M.D.   On: 07/08/2019 14:34   CT Abdomen Pelvis W Contrast  Result Date: 07/04/2019 CLINICAL DATA:  Nausea and vomiting for 2 days. EXAM: CT ABDOMEN AND PELVIS WITH CONTRAST TECHNIQUE: Multidetector CT imaging of the abdomen and pelvis was performed using the standard protocol following bolus administration of intravenous contrast. CONTRAST:   OMNIPAQUE IOHEXOL 300 MG/ML  SOLN COMPARISON:  February 09, 2018 FINDINGS: Lower chest: No acute abnormality. Hepatobiliary: No focal liver abnormality is seen. There is moderate severity central intrahepatic biliary dilatation with dilatation of the common bile duct. A moderate amount of intrahepatic biliary air is seen which extends into the common bile duct. Gallstones and a moderate amount of heterogeneous attenuation is seen within the gallbladder lumen. A mild-to-moderate amount of air is also seen within the gallbladder. Pancreas: Unremarkable. No pancreatic ductal dilatation or surrounding inflammatory changes. Spleen: Normal in size without focal abnormality. Adrenals/Urinary Tract: Adrenal glands are unremarkable. Kidneys are normal in size, without renal calculi or hydronephrosis. A 1.6 cm simple cyst is seen within the lateral aspect of the mid right kidney. A 2.0 cm exophytic cyst is seen along the medial aspect of the mid left kidney. Bladder is unremarkable. Stomach/Bowel: Stomach is within normal limits. Appendix appears normal. Multiple dilated loops of fluid-filled small bowel are seen throughout the abdomen and pelvis (maximum small bowel diameter of approximately 4.0 cm). Mild Peri intestinal inflammatory fat stranding is seen throughout the mesentery. Vascular/Lymphatic: Moderate to marked severity aortic tortuosity and atherosclerosis. No enlarged abdominal or pelvic lymph nodes. Reproductive: Uterus and bilateral adnexa are unremarkable. Other: No abdominal wall hernia or abnormality. No abdominopelvic ascites. Musculoskeletal: Degenerative changes are seen throughout the lumbar spine. IMPRESSION: 1. Multiple dilated loops of fluid-filled small bowel throughout the abdomen and pelvis, with associated mild peri intestinal inflammatory fat stranding. This is consistent with an infectious or inflammatory enteritis and subsequent ileus. 2. Cholelithiasis, moderate severity central  intrahepatic biliary dilatation and common bile duct dilatation with a moderate amount of pneumobilia and air within the gallbladder lumen. This is consistent with sequelae associated with emphysematous cholecystitis. Aortic Atherosclerosis (ICD10-I70.0). Electronically Signed   By: Aram Candela M.D.   On: 07/04/2019 23:13   DG Chest Portable 1 View  Result Date: 07/04/2019 CLINICAL DATA:  Shortness of breath. Nausea and vomiting for 2 days. EXAM: PORTABLE CHEST 1 VIEW COMPARISON:  Radiograph 07/11/2018 FINDINGS: Lower most aspect of the chest not included in the field of view. Left-sided pacemaker remains in place. Stable  cardiomegaly with transcatheter aortic valve replacement. No focal airspace disease or pulmonary edema. No large pleural effusion. No pneumothorax. Degenerative change of both shoulders. No acute osseous abnormalities are seen. IMPRESSION: Stable cardiomegaly. No acute abnormality. Electronically Signed   By: Narda Rutherford M.D.   On: 07/04/2019 19:04   DG Abd 2 Views  Result Date: 07/07/2019 CLINICAL DATA:  Generalized abdominal pain. EXAM: ABDOMEN - 2 VIEW COMPARISON:  None. FINDINGS: Dilated small bowel loops are noted concerning for distal small bowel obstruction or ileus. No colonic dilatation is noted. There is no evidence of free air. No radio-opaque calculi or other significant radiographic abnormality is seen. IMPRESSION: Dilated small bowel loops are noted concerning for distal small bowel obstruction or ileus. Follow-up radiographs are recommended. Electronically Signed   By: Lupita Raider M.D.   On: 07/07/2019 13:30   ECHOCARDIOGRAM COMPLETE  Result Date: 07/07/2019    ECHOCARDIOGRAM REPORT   Patient Name:   Dawn Gonzales Date of Exam: 07/06/2019 Medical Rec #:  [id number]             Height:       66.0 in Accession #:    [id number]            Weight:       268.0 lb Date of Birth:  [birth year]             BSA:          2.265 m Patient Age:    78 years               BP:           115/47 mmHg Patient Gender: F                     HR:           82 bpm. [facility] Procedure: 2D Echo Indications:    Abnormal ECG 794.31 / R94.31  History:        Patient has prior history of Echocardiogram examinations, most                 recent 12/26/2018. CHF, TIA; Risk Factors:Former Smoker,                 Dyslipidemia and Hypertension. TAVR, RBBB, History of cardiac                 pacemaker.  Sonographer:    Jeryl Columbia RDCS (AE) Referring Phys: [phone] [facility] IMPRESSIONS  1. Left ventricular ejection fraction, by estimation, is 60 to 65%. The left ventricle has normal function. The left ventricle has no regional wall motion abnormalities. There is mild left ventricular hypertrophy. Left ventricular diastolic parameters are indeterminate.  2. Right ventricular systolic function is normal. The right ventricular size is normal. Tricuspid regurgitation signal is inadequate for assessing PA pressure.  3. Left atrial size was severely dilated.  4. Right atrial size was mildly dilated.  5. The mitral valve is degenerative with mild thickening and calcification associated with severe annular calcification. Mild mitral valve regurgitation. Mild mitral stenosis. The mean mitral valve gradient is 4.0 mmHg.  6. The aortic valve has been repaired/replaced. 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 Aortic valve regurgitation is mild to moderate - perivalvular near septum. Stable mean gradient of 19 mmHg.  7. The inferior vena cava is dilated in size with >50% respiratory variability, suggesting right atrial pressure of  8 mmHg. FINDINGS  Left Ventricle: Left ventricular ejection fraction, by estimation, is 60 to 65%. The left ventricle has normal function. The left ventricle has no regional wall motion abnormalities. The left ventricular internal cavity size was normal in size. There is  mild left ventricular hypertrophy. Left ventricular diastolic parameters are  indeterminate. Right Ventricle: The right ventricular size is normal. No increase in right ventricular wall thickness. Right ventricular systolic function is normal. Tricuspid regurgitation signal is inadequate for assessing PA pressure. Left Atrium: Left atrial size was severely dilated. Right Atrium: Right atrial size was mildly dilated. Pericardium: There is no evidence of pericardial effusion. Mitral Valve: The mitral valve is degenerative in appearance. There is mild thickening of the mitral valve leaflet(s). There is mild calcification of the mitral valve leaflet(s). Severe mitral annular calcification. Mild mitral valve regurgitation. Mild mitral valve stenosis. MV peak gradient, 9.6 mmHg. The mean mitral valve gradient is 4.0 mmHg. Tricuspid Valve: The tricuspid valve is grossly normal. Tricuspid valve regurgitation is trivial. Aortic Valve: The aortic valve has been repaired/replaced. Aortic valve regurgitation is mild to moderate. Aortic regurgitation PHT measures 804 msec. Aortic valve mean gradient measures 19.0 mmHg. Aortic valve peak gradient measures 31.3 mmHg. Aortic valve area, by VTI measures 0.92 cm. Pulmonic Valve: The pulmonic valve was grossly normal. Pulmonic valve regurgitation is trivial. Aorta: The aortic root is normal in size and structure. Venous: The inferior vena cava is dilated in size with greater than 50% respiratory variability, suggesting right atrial pressure of 8 mmHg. IAS/Shunts: No atrial level shunt detected by color flow Doppler.  LEFT VENTRICLE PLAX 2D LVIDd:         5.09 cm  Diastology LVIDs:         2.62 cm  LV e' lateral:   7.51 cm/s LV PW:         1.34 cm  LV E/e' lateral: 16.2 LV IVS:        1.11 cm  LV e' medial:    5.33 cm/s LVOT diam:     1.90 cm  LV E/e' medial:  22.9 LV SV:         53 LV SV Index:   24 LVOT Area:     2.84 cm  RIGHT VENTRICLE RV S prime:     11.00 cm/s TAPSE (M-mode): 2.8 cm LEFT ATRIUM              Index       RIGHT ATRIUM           Index LA  diam:        5.20 cm  2.30 cm/m  RA Area:     24.60 cm LA Vol (A2C):   126.0 ml 55.64 ml/m RA Volume:   80.90 ml  35.72 ml/m LA Vol (A4C):   124.0 ml 54.75 ml/m LA Biplane Vol: 126.0 ml 55.64 ml/m  AORTIC VALVE AV Area (Vmax):    0.90 cm AV Area (Vmean):   0.81 cm AV Area (VTI):     0.92 cm AV Vmax:           279.67 cm/s AV Vmean:          207.333 cm/s AV VTI:            0.579 m AV Peak Grad:      31.3 mmHg AV Mean Grad:      19.0 mmHg LVOT Vmax:         88.80 cm/s LVOT Vmean:  59.333 cm/s LVOT VTI:          0.189 m LVOT/AV VTI ratio: 0.33 AI PHT:            804 msec  AORTA Ao Root diam: 3.20 cm MITRAL VALVE MV Area (PHT): 2.42 cm     SHUNTS MV Peak grad:  9.6 mmHg     Systemic VTI:  0.19 m MV Mean grad:  4.0 mmHg     Systemic Diam: 1.90 cm MV Vmax:       1.55 m/s MV Vmean:      88.4 cm/s MV Decel Time: 313 msec MR Peak grad: 70.2 mmHg MR Vmax:      419.00 cm/s MV E velocity: 122.00 cm/s MV A velocity: 128.00 cm/s MV E/A ratio:  0.95 Rozann Lesches MD Electronically signed by Rozann Lesches MD Signature Date/Time: 07/07/2019/11:49:08 AM    Final     Micro Results    Recent Results (from the past 240 hour(s))  Respiratory Panel by RT PCR (Flu A&B, Covid) - Nasopharyngeal Swab     Status: None   Collection Time: 07/04/19  9:45 PM   Specimen: Nasopharyngeal Swab  Result Value Ref Range Status   SARS Coronavirus 2 by RT PCR NEGATIVE NEGATIVE Final    Comment: (NOTE) SARS-CoV-2 target nucleic acids are NOT DETECTED. The SARS-CoV-2 RNA is generally detectable in upper respiratoy specimens during the acute phase of infection. The lowest concentration of SARS-CoV-2 viral copies this assay can detect is 131 copies/mL. A negative result does not preclude SARS-Cov-2 infection and should not be used as the sole basis for treatment or other patient management decisions. A negative result may occur with  improper specimen collection/handling, submission of specimen other than nasopharyngeal  swab, presence of viral mutation(s) within the areas targeted by this assay, and inadequate number of viral copies (<131 copies/mL). A negative result must be combined with clinical observations, patient history, and epidemiological information. The expected result is Negative. Fact Sheet for Patients:  PinkCheek.be Fact Sheet for Healthcare Providers:  GravelBags.it This test is not yet ap proved or cleared by the [place] FDA and  has been authorized for detection and/or diagnosis of SARS-CoV-2 by FDA under an Emergency Use Authorization (EUA). This EUA will remain  in effect (meaning this test can be used) for the duration of the COVID-19 declaration under Section 564(b)(1) of the Act, 21 U.S.C. section 360bbb-3(b)(1), unless the authorization is terminated or revoked sooner.    Influenza A by PCR NEGATIVE NEGATIVE Final   Influenza B by PCR NEGATIVE NEGATIVE Final    Comment: (NOTE) The Xpert Xpress SARS-CoV-2/FLU/RSV assay is intended as an aid in  the diagnosis of influenza from Nasopharyngeal swab specimens and  should not be used as a sole basis for treatment. Nasal washings and  aspirates are unacceptable for Xpert Xpress SARS-CoV-2/FLU/RSV  testing. Fact Sheet for Patients: PinkCheek.be Fact Sheet for Healthcare Providers: GravelBags.it This test is not yet approved or cleared by the [place] FDA and  has been authorized for detection and/or diagnosis of SARS-CoV-2 by  FDA under an Emergency Use Authorization (EUA). This EUA will remain  in effect (meaning this test can be used) for the duration of the  Covid-19 declaration under Section 564(b)(1) of the Act, 21  U.S.C. section 360bbb-3(b)(1), unless the authorization is  terminated or revoked. Performed at [facility], [address], [place], [place] [address]        Today   Subjective  Dawn Gonzales today has no new complaints       -  no further bloody BMs  -No chest pains no dizziness -No emesis -Tolerating oral intake     Patient has been seen and examined prior to discharge   Objective   Blood pressure (!) 149/72, pulse 72, temperature 99.7 F (37.6 C), temperature source Oral, resp. rate 20, height  (1.676 m), weight 121.6 kg, SpO2 98 %.   Intake/Output Summary (Last 24 hours) at 07/10/2019 1353 Last data filed at 07/10/2019 1140 Gross per 24 hour  Intake --  Output 1150 ml  Net -1150 ml   Exam Gen:- Awake Alert, no acute distress  HEENT:- [place].AT, No sclera icterus Neck-Supple Neck,No JVD,.  Lungs-  CTAB , good air movement bilaterally  CV- S1, S2 normal, regular Abd-  +ve B.Sounds, Abd Soft, No tenderness,    Extremity/Skin:- No  edema,   good pulses Psych-affect is appropriate, oriented x3 Neuro-no new focal deficits, no tremors    Data Review   CBC w Diff:  Lab Results  Component Value Date   WBC 6.4 07/10/2019   HGB 13.0 07/10/2019   HCT 41.0 07/10/2019   PLT 245 07/10/2019   LYMPHOPCT 12 07/04/2019   MONOPCT 7 07/04/2019   EOSPCT 1 07/04/2019   BASOPCT 0 07/04/2019    CMP:  Lab Results  Component Value Date   NA 136 07/09/2019   NA 141 05/10/2018   K 2.9 (L) 07/09/2019   CL 97 (L) 07/09/2019   CO2 30 07/09/2019   BUN 8 07/09/2019   BUN 27 05/10/2018   CREATININE 0.65 07/09/2019   PROT 6.5 07/05/2019   ALBUMIN 3.1 (L) 07/05/2019   BILITOT 0.8 07/05/2019   ALKPHOS 64 07/05/2019   AST 16 07/05/2019   ALT 10 07/05/2019  .   Total Discharge time is about 33 minutes  Shon Hale M.D on 07/10/2019 at 1:53 PM  Go to [email or web address] -  for contact info  [facility] - Office  [phone]

## 2019-07-10 NOTE — Progress Notes (Signed)
IV removed, D/C instructions reviewed with patient and daughter, verbalized understanding. Patient transported to private vehicle via persona wheelchair.

## 2019-07-10 NOTE — Discharge Instructions (Signed)
Avoid ibuprofen/Advil/Aleve/Motrin/Goody Powders/Naproxen/BC powders/Meloxicam/Diclofenac/Indomethacin and other Nonsteroidal anti-inflammatory medications as these will make you more likely to bleed and can cause stomach ulcers, can also cause Kidney problems.   -Avoid constipation

## 2019-07-14 ENCOUNTER — Ambulatory Visit (INDEPENDENT_AMBULATORY_CARE_PROVIDER_SITE_OTHER): Payer: Medicare PPO | Admitting: *Deleted

## 2019-07-14 DIAGNOSIS — R001 Bradycardia, unspecified: Secondary | ICD-10-CM | POA: Diagnosis not present

## 2019-07-14 LAB — CUP PACEART REMOTE DEVICE CHECK
Date Time Interrogation Session: 20210315173051
Implantable Lead Implant Date: 20200311
Implantable Lead Implant Date: 20200311
Implantable Lead Location: 753859
Implantable Lead Location: 753860
Implantable Lead Model: 377
Implantable Lead Model: 377
Implantable Lead Serial Number: 81024123
Implantable Lead Serial Number: 81075341
Implantable Pulse Generator Implant Date: 20200311
Pulse Gen Model: 407145
Pulse Gen Serial Number: 69550148

## 2019-07-15 DIAGNOSIS — E782 Mixed hyperlipidemia: Secondary | ICD-10-CM | POA: Diagnosis not present

## 2019-07-15 DIAGNOSIS — M180 Bilateral primary osteoarthritis of first carpometacarpal joints: Secondary | ICD-10-CM | POA: Diagnosis not present

## 2019-07-15 DIAGNOSIS — G629 Polyneuropathy, unspecified: Secondary | ICD-10-CM | POA: Diagnosis not present

## 2019-07-15 DIAGNOSIS — M170 Bilateral primary osteoarthritis of knee: Secondary | ICD-10-CM | POA: Diagnosis not present

## 2019-07-15 DIAGNOSIS — F331 Major depressive disorder, recurrent, moderate: Secondary | ICD-10-CM | POA: Diagnosis not present

## 2019-07-15 DIAGNOSIS — I350 Nonrheumatic aortic (valve) stenosis: Secondary | ICD-10-CM | POA: Diagnosis not present

## 2019-07-15 NOTE — Progress Notes (Signed)
PPM Remote  

## 2019-07-22 NOTE — Progress Notes (Signed)
[facility]   MULTIDISCIPLINARY HEART VALVE TEAM  Virtual Visit via Telephone Note   This visit type was conducted due to national recommendations for restrictions regarding the COVID-19 Pandemic (e.g. social distancing) in an effort to limit this patient's exposure and mitigate transmission in our community.  Due to her co-morbid illnesses, this patient is at least at moderate risk for complications without adequate follow up.  This format is felt to be most appropriate for this patient at this time.  The patient did not have access to video technology/had technical difficulties with video requiring transitioning to audio format only (telephone).  All issues noted in this document were discussed and addressed.  No physical exam could be performed with this format.  Please refer to the patient's chart for her  consent to telehealth for [facility].   Evaluation Performed:  Follow-up visit  Date:  07/24/2019   ID:  Dawn Gonzales, DOB 10-25-1940, MRN [id number]  Patient Location: Home Provider Location: Office  PCP:  Benita Stabile, MD  Cardiologist:  Verne Carrow, MD ---> switched to Dr. Diona Browner since it's closer to her home  Electrophysiologist:  Lewayne Bunting, MD   Chief Complaint:  1 year s/p TAVR  History of Present Illness:    Dawn Gonzales is a 79 y.o. 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   She underwent successful TAVR with a13mm Edwards Sapien 3 THV via the TF approach on 07/09/18. Post operative echoshowedEF 60%, normally functioning TAVR with mean gradient of 15.6 mmHg and two jets of trivial and mild PVL. Her post op course was complicated by complete heart block  and she underwent Biotronikdual-chamber pacemakerby Dr. Ladona Ridgel on 07/10/18. Her 1 month echo was pushed out given the covid 19 pandemic. This was completed on 12/26/18 and showed EF 60-65%, normally functioning TAVR with mean gradient of 20 mm Hg and mild-mod PVL. There was some question of the valve appearing more elliptical in shape with increased lucency in the perivavluar area near the RVOT. This was reviewed with our multidisciplinary team and felt to be stable from previous. She was recently admitted 3/5-3/11/21 for gallstone ileus. A echocardiogram was repeated during this admission on 07/07/19 which showed EF 60-65%, normally functioning TAVR with mild-mod PVL near septum and mean gradient of 19 mm Hg. Of note, there was a renal cyst that we got follow up MRI. This required 6 month follow up, which will be followed by Dr. Margo Aye.   The patient does not have symptoms concerning for COVID-19 infection (fever, chills, cough, or new shortness of breath).   Today she presents via telehealth for follow up. No CP or SOB. No LE edema, orthopnea or PND. No dizziness or syncope. No blood in stool or urine. No palpitations. She has been taking care of the house and works on the yard.    Past Medical History:  Diagnosis Date  . Anemia   . Anxiety   . Arthritis   . Bilateral renal cysts   . Cholecystitis   . Chronic diastolic heart failure (HCC)   . Depression   . Essential hypertension   . History of cellulitis   . History of CVA (cerebrovascular accident)   . History of endocarditis   . Hyperlipemia   .  Morbid obesity ([place])   . Neuropathy   . Right bundle branch block (RBBB)   . 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 Severe aortic stenosis   . Sleep apnea    Past Surgical History:  Procedure Laterality Date  . Abdominal gangrene    . ADENOIDECTOMY    . Cataract  surgery    . COLONOSCOPY    . ENDOSCOPIC RETROGRADE CHOLANGIOPANCREATOGRAPHY (ERCP) WITH PROPOFOL N/A 02/08/2018   Procedure: ENDOSCOPIC RETROGRADE CHOLANGIOPANCREATOGRAPHY (ERCP) WITH PROPOFOL;  Surgeon: Rush Landmark Telford Nab., MD;  Location: [facility];  Service: Gastroenterology;  Laterality: N/A;  . EUS  02/08/2018   Procedure: UPPER ENDOSCOPIC ULTRASOUND (EUS) LINEAR;  Surgeon: Irving Copas., MD;  Location: [place];  Service: Gastroenterology;;  . EYE SURGERY    . IR FLUORO RM 30-60 MIN  03/27/2018  . IR PERC CHOLECYSTOSTOMY  02/09/2018  . IR RADIOLOGIST EVAL & MGMT  03/26/2018  . IR RADIOLOGY PERIPHERAL GUIDED IV START  05/22/2018  . IR RADIOLOGY PERIPHERAL GUIDED IV START  05/30/2018  . IR RADIOLOGY PERIPHERAL GUIDED IV START  06/05/2018  . IR US GUIDE VASC ACCESS RIGHT  05/22/2018  . IR US GUIDE VASC ACCESS RIGHT  05/30/2018  . IR US GUIDE VASC ACCESS RIGHT  06/05/2018  . PACEMAKER IMPLANT N/A 07/10/2018   Procedure: PACEMAKER IMPLANT;  Surgeon: Evans Lance, MD;  Location: [facility];  Service: Cardiovascular;  Laterality: N/A;  . REMOVAL OF STONES  02/08/2018   Procedure: REMOVAL OF STONES;  Surgeon: Irving Copas., MD;  Location: [facility];  Service: Gastroenterology;;  . Joan Mayans  02/08/2018   Procedure: Joan Mayans;  Surgeon: Mansouraty, Telford Nab., MD;  Location: [place];  Service: Gastroenterology;;  . TONSILLECTOMY    . TOOTH EXTRACTION N/A 06/18/2018   Procedure: DENTAL RESTORATION/EXTRACTIONS;  Surgeon: Diona Browner, DDS;  Location: [employer];  Service: Oral Surgery;  Laterality: N/A;  . TRANSCATHETER AORTIC VALVE REPLACEMENT, TRANSFEMORAL N/A 07/09/2018   Procedure: TRANSCATHETER AORTIC VALVE REPLACEMENT, TRANSFEMORAL;  Surgeon: Sherren Mocha, MD;  Location: [facility];  Service: Cardiovascular;  Laterality: N/A;  . Tummy tuck       Current Meds  Medication Sig  . acetaminophen (TYLENOL) 325 MG tablet Take 2  tablets (650 mg total) by mouth every 6 (six) hours as needed for mild pain, fever or headache (or Fever >/= 101).  Marland Kitchen aspirin EC 81 MG tablet Take 1 tablet (81 mg total) by mouth daily with breakfast.  . Calcium Carbonate (CALCIUM-CARB 600 PO) Take 600 mg by mouth daily.  . cholecalciferol (VITAMIN D) 1000 units tablet Take 1,000 Units by mouth daily.  . ferrous sulfate 325 (65 FE) MG tablet Take 325 mg by mouth daily with breakfast.  . gabapentin (NEURONTIN) 300 MG capsule Take 300 mg by mouth 3 (three) times daily.  . Multiple Vitamin (MULTIVITAMIN WITH MINERALS) TABS tablet Take 1 tablet by mouth daily.  . pantoprazole (PROTONIX) 40 MG tablet Take 1 tablet (40 mg total) by mouth daily.  . polyethylene glycol (MIRALAX MIX-IN PAX) 17 g packet Take 17 g by mouth daily as needed for mild constipation or moderate constipation.  . pravastatin (PRAVACHOL) 20 MG tablet Take 1 tablet (20 mg total) by mouth at bedtime.  . silver sulfADIAZINE (SILVADENE) 1 % cream Apply 1 application topically 2 (two) times daily.  . traMADol (ULTRAM) 50 MG tablet  Take 50 mg by mouth every 6 (six) hours as needed for moderate pain.  Marland Kitchen venlafaxine (EFFEXOR) 37.5 MG tablet Take 37.5 mg by mouth daily.  . vitamin C (ASCORBIC ACID) 500 MG tablet Take 500 mg by mouth daily.     Allergies:   Patient has no known allergies.   Social History   Tobacco Use  . Smoking status: Former Smoker    Types: Cigarettes    Quit date: 05/23/1978    Years since quitting: 41.1  . Smokeless tobacco: Never Used  . Tobacco comment: only 1 pack pewr week   Substance Use Topics  . Alcohol use: Not Currently    Comment: Occasional  . Drug use: Never     Family Hx: The patient's family history includes Arthritis in her father; Cancer in her mother; Heart attack in her father; Hypertension in her father.  ROS:   Please see the history of present illness.    All other systems reviewed and are negative.   Prior CV studies:   The  following studies were reviewed today:  TAVR OPERATIVE NOTE   Date of Procedure:                07/09/2018  Preoperative Diagnosis:      Severe Aortic Stenosis   Postoperative Diagnosis:    Same   Procedure:        Transcatheter Aortic Valve Replacement - Percutaneous Transfemoral Approach             Edwards Sapien 3 THV (size 26 mm, model # 9600TFX, serial #[id number])              Co-Surgeons:                        Salvatore Decent. Cornelius Moras, MD and Tonny Bollman, MD  Anesthesiologist:                  Karna Christmas, MD  Echocardiographer:              Tobias Alexander, MD  Pre-operative Echo Findings: ? Severe aortic stenosis ? Normal left ventricular systolic function  Post-operative Echo Findings: ? Mild paravalvular leak ? Normal/unchanged left ventricular systolic function  _________________   Echo 12/26/2018 IMPRESSIONS  1. Left ventricular ejection fraction, by estimation, is 60 to 65%. The  left ventricle has normal function. The left ventricle has no regional  wall motion abnormalities. There is mild left ventricular hypertrophy.  Left ventricular diastolic parameters  are indeterminate.  2. Right ventricular systolic function is normal. The right ventricular  size is normal. Tricuspid regurgitation signal is inadequate for assessing  PA pressure.  3. Left atrial size was severely dilated.  4. Right atrial size was mildly dilated.  5. The mitral valve is degenerative with mild thickening and  calcification associated with severe annular calcification. Mild mitral  valve regurgitation. Mild mitral stenosis. The mean mitral valve gradient  is 4.0 mmHg.  6. The aortic valve has been repaired/replaced. There is a 26 mm Edwards  Sapien 3 prosthesis in position. Aortic valve regurgitation is mild to  moderate - perivalvular near septum. Stable mean gradient of 19 mmHg.  7. The inferior vena cava is dilated in size with >50% respiratory  variability,  suggesting right atrial pressure of 8 mmHg.  Compared to prior echo, the TAVR appears more elliptical in shape with increased lucency in the perivavluar area near the RVOT. The perivavlular AI appears to have worsened.  Consider TEE for further evaluation  _________________  Echo 07/06/2019 IMPRESSIONS 1. Left ventricular ejection fraction, by estimation, is 60 to 65%. The  left ventricle has normal function. The left ventricle has no regional  wall motion abnormalities. There is mild left ventricular hypertrophy.  Left ventricular diastolic parameters  are indeterminate.  2. Right ventricular systolic function is normal. The right ventricular  size is normal. Tricuspid regurgitation signal is inadequate for assessing  PA pressure.  3. Left atrial size was severely dilated.  4. Right atrial size was mildly dilated.  5. The mitral valve is degenerative with mild thickening and  calcification associated with severe annular calcification. Mild mitral  valve regurgitation. Mild mitral stenosis. The mean mitral valve gradient  is 4.0 mmHg.  6. The aortic valve has been repaired/replaced. There is a 26 mm Edwards  Sapien 3 prosthesis in position. Aortic valve regurgitation is mild to  moderate - perivalvular near septum. Stable mean gradient of 19 mmHg.  7. The inferior vena cava is dilated in size with >50% respiratory  variability, suggesting right atrial pressure of 8 mmHg.   Labs/Other Tests and Data Reviewed:    EKG:  No ECG reviewed.  Recent Labs: 07/05/2019: ALT 10 07/09/2019: BUN 8; Creatinine, Ser 0.65; Potassium 2.9; Sodium 136 07/10/2019: Hemoglobin 13.0; Platelets 245   Recent Lipid Panel No results found for: CHOL, TRIG, HDL, CHOLHDL, LDLCALC, LDLDIRECT  Wt Readings from Last 3 Encounters:  07/24/19 282 lb 3 oz (128 kg)  07/04/19 268 lb (121.6 kg)  12/26/18 270 lb 1.9 oz (122.5 kg)     Objective:    Vital Signs:  BP (!) 125/55   Pulse 72   Ht 5\' 6"  (1.676 m)    Wt 282 lb 3 oz (128 kg)   BMI 45.55 kg/m     ASSESSMENT & PLAN:    Severe AS s/p TAVR: echo 07/06/19 showed EF 60%, normally functioning TAVR with a mean gradient of 19 mm Hg and stable mild-mod PVL She has NYHA class I symptoms. SBE prophylaxis discussed; the patient is edentulous and does not go to the dentist. She will continue on a baby aspirin indefinitely. She will continue long term follow up with Dr. 09/05/19 (she switched from [place] since [place] since it is closer).   COVID-19 Education: The signs and symptoms of COVID-19 were discussed with the patient and how to seek care for testing (follow up with PCP or arrange E-visit).  The importance of social distancing was discussed today.  Time:   Today, I have spent 11 minutes with the patient with telehealth technology discussing the above problems.     Medication Adjustments/Labs and Tests Ordered: Current medicines are reviewed at length with the patient today.  Concerns regarding medicines are outlined above.   Tests Ordered: No orders of the defined types were placed in this encounter.   Medication Changes: No orders of the defined types were placed in this encounter.    Disposition:  Follow up in 6 month(s)  Signed, [place], PA-C  07/24/2019 2:29 PM    [facility]

## 2019-07-22 NOTE — Progress Notes (Deleted)
[facility]   MULTIDISCIPLINARY HEART VALVE CLINIC                                       Cardiology Office Note    Date:  07/22/2019   ID:  Dawn Gonzales, DOB 04/30/1941, MRN [id number]  PCP:  Benita Stabile, MD  Cardiologist: Verne Carrow, MD   CC: 1 year s/p TAVR  History of Present Illness:  Dawn Gonzales is a 79 y.o. 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   She underwent successful TAVR with a53mm Edwards Sapien 3 THV via the TF approach on 07/09/18. Post operative echoshowedEF 60%, normally functioning TAVR with mean gradient of 15.6 mmHg and two jets of trivial and mild PVL. Her post op course was complicated by complete heart block and she underwent Biotronikdual-chamber pacemakerby Dr. Ladona Ridgel on 07/10/18. Her 1 month echo was pushed out given the covid 19 pandemic. This was completed on 12/26/18 and showed EF 60-65%, normally functioning TAVR with mean gradient of 20 mm Hg and mild-mod PVL. There was some question of the valve appearing more elliptical in shape with increased lucency in the perivavluar area near the RVOT. This was reviewed with our multidisciplinary team and felt to be stable from previous. She was    Past Medical History:  Diagnosis Date  . Anemia   . Anxiety   . Arthritis   . Bilateral renal cysts   . Cholecystitis   . Chronic diastolic heart failure (HCC)   . Depression   . Essential hypertension   . History of cellulitis   . History of CVA (cerebrovascular accident)   . History of endocarditis   . Hyperlipemia   . Morbid obesity (HCC)   . Neuropathy   . Right bundle branch block (RBBB)   . 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 Severe aortic stenosis   . Sleep apnea     Past Surgical History:  Procedure Laterality Date  . Abdominal gangrene    . ADENOIDECTOMY    . Cataract surgery    . COLONOSCOPY    . ENDOSCOPIC RETROGRADE CHOLANGIOPANCREATOGRAPHY (ERCP) WITH PROPOFOL N/A 02/08/2018   Procedure: ENDOSCOPIC RETROGRADE CHOLANGIOPANCREATOGRAPHY (ERCP) WITH PROPOFOL;  Surgeon: Meridee Score Netty Starring., MD;  Location: [facility] ENDOSCOPY;  Service: Gastroenterology;  Laterality: N/A;  . EUS  02/08/2018   Procedure: UPPER ENDOSCOPIC ULTRASOUND (EUS) LINEAR;  Surgeon: Lemar Lofty., MD;  Location: [facility] ENDOSCOPY;  Service: Gastroenterology;;  . EYE SURGERY    . IR FLUORO RM 30-60 MIN  03/27/2018  . IR PERC CHOLECYSTOSTOMY  02/09/2018  . IR RADIOLOGIST EVAL & MGMT  03/26/2018  . IR RADIOLOGY PERIPHERAL GUIDED IV START  05/22/2018  . IR RADIOLOGY PERIPHERAL GUIDED IV START  05/30/2018  . IR RADIOLOGY PERIPHERAL GUIDED IV START  06/05/2018  . IR US GUIDE VASC ACCESS RIGHT  05/22/2018  . IR US GUIDE VASC ACCESS RIGHT  05/30/2018  . IR US GUIDE VASC ACCESS RIGHT  06/05/2018  . PACEMAKER IMPLANT N/A 07/10/2018   Procedure: PACEMAKER IMPLANT;  Surgeon:  Evans Lance, MD;  Location: [place] CV LAB;  Service: Cardiovascular;  Laterality: N/A;  . REMOVAL OF STONES  02/08/2018   Procedure: REMOVAL OF STONES;  Surgeon: Irving Copas., MD;  Location: [place];  Service: Gastroenterology;;  . Joan Mayans  02/08/2018   Procedure: Joan Mayans;  Surgeon: Mansouraty, Telford Nab., MD;  Location: [place];  Service: Gastroenterology;;  . TONSILLECTOMY    . TOOTH EXTRACTION N/A 06/18/2018   Procedure: DENTAL RESTORATION/EXTRACTIONS;  Surgeon: Diona Browner, DDS;  Location: [place];  Service: Oral Surgery;  Laterality: N/A;  . TRANSCATHETER AORTIC VALVE REPLACEMENT, TRANSFEMORAL N/A 07/09/2018    Procedure: TRANSCATHETER AORTIC VALVE REPLACEMENT, TRANSFEMORAL;  Surgeon: Sherren Mocha, MD;  Location: [place] [facility];  Service: Cardiovascular;  Laterality: N/A;  . Tummy tuck      Current Medications: Outpatient Medications Prior to Visit  Medication Sig Dispense Refill  . acetaminophen (TYLENOL) 325 MG tablet Take 2 tablets (650 mg total) by mouth every 6 (six) hours as needed for mild pain, fever or headache (or Fever >/= 101). 12 tablet 0  . aspirin EC 81 MG tablet Take 1 tablet (81 mg total) by mouth daily with breakfast. 12 tablet 0  . Calcium Carbonate (CALCIUM-CARB 600 PO) Take 600 mg by mouth daily.    . cholecalciferol (VITAMIN D) 1000 units tablet Take 1,000 Units by mouth daily.    . clopidogrel (PLAVIX) 75 MG tablet TAKE 1 TABLET BY MOUTH EVERY DAY 90 tablet 2  . ferrous sulfate 325 (65 FE) MG tablet Take 325 mg by mouth daily with breakfast.    . gabapentin (NEURONTIN) 300 MG capsule Take 300 mg by mouth 3 (three) times daily.    . Multiple Vitamin (MULTIVITAMIN WITH MINERALS) TABS tablet Take 1 tablet by mouth daily.    . pantoprazole (PROTONIX) 40 MG tablet Take 1 tablet (40 mg total) by mouth daily. 30 tablet 1  . polyethylene glycol (MIRALAX MIX-IN PAX) 17 g packet Take 17 g by mouth daily as needed for mild constipation or moderate constipation. 14 each 0  . pravastatin (PRAVACHOL) 20 MG tablet Take 1 tablet (20 mg total) by mouth at bedtime. 30 tablet 3  . silver sulfADIAZINE (SILVADENE) 1 % cream Apply 1 application topically 2 (two) times daily.    . traMADol (ULTRAM) 50 MG tablet Take 50 mg by mouth every 6 (six) hours as needed for moderate pain.    Marland Kitchen venlafaxine (EFFEXOR) 37.5 MG tablet Take 37.5 mg by mouth daily.    . vitamin C (ASCORBIC ACID) 500 MG tablet Take 500 mg by mouth daily.     No facility-administered medications prior to visit.     Allergies:   Patient has no known allergies.   Social History   Socioeconomic History  . Marital status:  Married    Spouse name: Not on file  . Number of children: 3  . Years of education: Not on file  . Highest education level: Not on file  Occupational History  . Occupation: Retired houswife  Tobacco Use  . Smoking status: Former Smoker    Types: Cigarettes    Quit date: 05/23/1978    Years since quitting: 41.1  . Smokeless tobacco: Never Used  . Tobacco comment: only 1 pack pewr week   Substance and Sexual Activity  . Alcohol use: Not Currently    Comment: Occasional  . Drug use: Never  . Sexual activity: Not on file  Other Topics Concern  . Not on file  Social History Narrative  . Not on file   Social Determinants of Health   Financial Resource Strain:   . Difficulty of Paying Living Expenses:   Food Insecurity:   . Worried About Programme researcher, broadcasting/film/video in the Last Year:   . Barista in the Last Year:   Transportation Needs:   . Freight forwarder (Medical):   Marland Kitchen Lack of Transportation (Non-Medical):   Physical Activity:   . Days of Exercise per Week:   . Minutes of Exercise per Session:   Stress:   . Feeling of Stress :   Social Connections:   . Frequency of Communication with Friends and Family:   . Frequency of Social Gatherings with Friends and Family:   . Attends Religious Services:   . Active Member of Clubs or Organizations:   . Attends Banker Meetings:   Marland Kitchen Marital Status:      Family History:  The patient's ***family history includes Arthritis in her father; Cancer in her mother; Heart attack in her father; Hypertension in her father.     ROS:   Please see the history of present illness.    ROS All other systems reviewed and are negative.   PHYSICAL EXAM:   VS:  There were no vitals taken for this visit.   GEN: Well nourished, well developed, in no acute distress HEENT: normal Neck: no JVD or masses Cardiac: ***RRR; no murmurs, rubs, or gallops,no edema  Respiratory:  clear to auscultation bilaterally, normal work of breathing  GI: soft, nontender, nondistended, + BS MS: no deformity or atrophy Skin: warm and dry, no rash Neuro:  Alert and Oriented x 3, Strength and sensation are intact Psych: euthymic mood, full affect   Wt Readings from Last 3 Encounters:  07/04/19 268 lb (121.6 kg)  12/26/18 270 lb 1.9 oz (122.5 kg)  10/14/18 273 lb 9.6 oz (124.1 kg)      Studies/Labs Reviewed:   EKG:  EKG is*** ordered today.  The ekg ordered today demonstrates ***  Recent Labs: 07/05/2019: ALT 10 07/09/2019: BUN 8; Creatinine, Ser 0.65; Potassium 2.9; Sodium 136 07/10/2019: Hemoglobin 13.0; Platelets 245   Lipid Panel No results found for: CHOL, TRIG, HDL, CHOLHDL, VLDL, LDLCALC, LDLDIRECT  Additional studies/ records that were reviewed today include:  ***   ASSESSMENT & PLAN:       Medication Adjustments/Labs and Tests Ordered: Current medicines are reviewed at length with the patient today.  Concerns regarding medicines are outlined above.  Medication changes, Labs and Tests ordered today are listed in the Patient Instructions below. There are no Patient Instructions on file for this visit.   Signed, Cline Crock, PA-C  07/22/2019 4:29 PM    [facility] [address] [place], [place], [place]  [address] Phone: [phone]; Fax: [phone]

## 2019-07-23 ENCOUNTER — Ambulatory Visit: Payer: Self-pay | Admitting: Physician Assistant

## 2019-07-24 ENCOUNTER — Telehealth (INDEPENDENT_AMBULATORY_CARE_PROVIDER_SITE_OTHER): Payer: Medicare PPO | Admitting: Physician Assistant

## 2019-07-24 ENCOUNTER — Encounter: Payer: Self-pay | Admitting: Physician Assistant

## 2019-07-24 ENCOUNTER — Ambulatory Visit: Payer: Self-pay | Admitting: Physician Assistant

## 2019-07-24 ENCOUNTER — Other Ambulatory Visit (HOSPITAL_COMMUNITY): Payer: Medicare PPO

## 2019-07-24 ENCOUNTER — Other Ambulatory Visit: Payer: Self-pay

## 2019-07-24 VITALS — BP 125/55 | HR 72 | Ht 66.0 in | Wt 282.2 lb

## 2019-07-24 DIAGNOSIS — Z952 Presence of prosthetic heart valve: Secondary | ICD-10-CM

## 2019-08-12 ENCOUNTER — Emergency Department (HOSPITAL_COMMUNITY): Payer: Medicare PPO

## 2019-08-12 ENCOUNTER — Other Ambulatory Visit: Payer: Self-pay

## 2019-08-12 ENCOUNTER — Encounter (HOSPITAL_COMMUNITY): Payer: Self-pay

## 2019-08-12 ENCOUNTER — Emergency Department (HOSPITAL_COMMUNITY)
Admission: EM | Admit: 2019-08-12 | Discharge: 2019-08-13 | Disposition: A | Discharge status: Home / Self Care | Payer: Medicare PPO | Attending: Emergency Medicine | Admitting: Emergency Medicine

## 2019-08-12 DIAGNOSIS — Z79899 Other long term (current) drug therapy: Secondary | ICD-10-CM | POA: Diagnosis not present

## 2019-08-12 DIAGNOSIS — Z7982 Long term (current) use of aspirin: Secondary | ICD-10-CM | POA: Diagnosis not present

## 2019-08-12 DIAGNOSIS — I110 Hypertensive heart disease with heart failure: Secondary | ICD-10-CM | POA: Insufficient documentation

## 2019-08-12 DIAGNOSIS — R109 Unspecified abdominal pain: Secondary | ICD-10-CM | POA: Diagnosis present

## 2019-08-12 DIAGNOSIS — R1011 Right upper quadrant pain: Secondary | ICD-10-CM | POA: Insufficient documentation

## 2019-08-12 DIAGNOSIS — Z87891 Personal history of nicotine dependence: Secondary | ICD-10-CM | POA: Diagnosis not present

## 2019-08-12 DIAGNOSIS — I5032 Chronic diastolic (congestive) heart failure: Secondary | ICD-10-CM | POA: Insufficient documentation

## 2019-08-12 DIAGNOSIS — R1084 Generalized abdominal pain: Secondary | ICD-10-CM | POA: Diagnosis not present

## 2019-08-12 DIAGNOSIS — I959 Hypotension, unspecified: Secondary | ICD-10-CM | POA: Diagnosis not present

## 2019-08-12 DIAGNOSIS — R112 Nausea with vomiting, unspecified: Secondary | ICD-10-CM | POA: Diagnosis not present

## 2019-08-12 DIAGNOSIS — R110 Nausea: Secondary | ICD-10-CM | POA: Diagnosis not present

## 2019-08-12 DIAGNOSIS — R52 Pain, unspecified: Secondary | ICD-10-CM | POA: Diagnosis not present

## 2019-08-12 DIAGNOSIS — K823 Fistula of gallbladder: Secondary | ICD-10-CM | POA: Diagnosis not present

## 2019-08-12 DIAGNOSIS — R1111 Vomiting without nausea: Secondary | ICD-10-CM | POA: Diagnosis not present

## 2019-08-12 DIAGNOSIS — R0902 Hypoxemia: Secondary | ICD-10-CM | POA: Diagnosis not present

## 2019-08-12 LAB — CBC
HCT: 41.6 % (ref 36.0–46.0)
Hemoglobin: 12.7 g/dL (ref 12.0–15.0)
MCH: 29.6 pg (ref 26.0–34.0)
MCHC: 30.5 g/dL (ref 30.0–36.0)
MCV: 97 fL (ref 80.0–100.0)
Platelets: 228 10*3/uL (ref 150–400)
RBC: 4.29 MIL/uL (ref 3.87–5.11)
RDW: 15.2 % (ref 11.5–15.5)
WBC: 8.5 10*3/uL (ref 4.0–10.5)
nRBC: 0 % (ref 0.0–0.2)

## 2019-08-12 LAB — COMPREHENSIVE METABOLIC PANEL
ALT: 11 U/L (ref 0–44)
AST: 15 U/L (ref 15–41)
Albumin: 4.1 g/dL (ref 3.5–5.0)
Alkaline Phosphatase: 74 U/L (ref 38–126)
Anion gap: 11 (ref 5–15)
BUN: 20 mg/dL (ref 8–23)
CO2: 27 mmol/L (ref 22–32)
Calcium: 9.3 mg/dL (ref 8.9–10.3)
Chloride: 100 mmol/L (ref 98–111)
Creatinine, Ser: 0.7 mg/dL (ref 0.44–1.00)
GFR calc Af Amer: >60 mL/min (ref >60)
GFR calc non Af Amer: >60 mL/min (ref >60)
Glucose, Bld: 122 mg/dL — ABNORMAL HIGH (ref 70–99)
Potassium: 3.6 mmol/L (ref 3.5–5.1)
Sodium: 138 mmol/L (ref 135–145)
Total Bilirubin: 0.6 mg/dL (ref 0.3–1.2)
Total Protein: 7.6 g/dL (ref 6.5–8.1)

## 2019-08-12 LAB — LIPASE, BLOOD: Lipase: 18 U/L (ref 11–51)

## 2019-08-12 MED ORDER — ONDANSETRON 4 MG PO TBDP
4.0000 mg | ORAL_TABLET | Freq: Once | ORAL | Status: AC
  Administered 2019-08-12: 4 mg via ORAL
  Filled 2019-08-12: qty 1

## 2019-08-12 MED ORDER — FENTANYL CITRATE (PF) 100 MCG/2ML IJ SOLN: 50.0000 ug | Freq: Once | INTRAMUSCULAR | Status: DC

## 2019-08-12 MED ORDER — FENTANYL CITRATE (PF) 100 MCG/2ML IJ SOLN
50.0000 ug | Freq: Once | INTRAMUSCULAR | Status: AC
  Administered 2019-08-12: 50 ug via INTRAMUSCULAR
  Filled 2019-08-12: qty 2

## 2019-08-12 MED ORDER — SODIUM CHLORIDE 0.9 % IV BOLUS: 1000.0000 mL | Freq: Once | INTRAVENOUS | Status: DC

## 2019-08-12 MED ORDER — ONDANSETRON HCL 4 MG/2ML IJ SOLN: 4.0000 mg | Freq: Once | INTRAMUSCULAR | Status: DC

## 2019-08-12 MED ORDER — IOHEXOL 300 MG/ML  SOLN: 100.0000 mL | Freq: Once | INTRAMUSCULAR | Status: DC | PRN

## 2019-08-12 NOTE — ED Provider Notes (Signed)
[facility] EMERGENCY DEPARTMENT Provider Note   CSN: [id number] Arrival date & time: 08/12/19  1947     History Chief Complaint  Patient presents with  . Abdominal Pain    Dawn Gonzales is a 79 y.o. female.  Dawn Gonzales is a 79 y.o. female with a history of gallstones, chronic diastolic heart failure, hypertension, obesity, hyperlipidemia, stroke, who presents to the emergency department for evaluation of right upper quadrant abdominal pain.  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  She reports that this morning she had acute onset of right upper quadrant pain similar to previous episodes.  She reports a few episodes of nonbloody emesis.  She has been able to pass stools without difficulty.  Denies any fevers or chills.  She has not had surgery on her gallbladder, additional history obtained from chart review, of patient's recent admission, she was seen and followed by Dr. Arnoldo Morale with general surgery who wanted to hold off on surgery with inflammation present.  Patient has not followed up with Dr. Arnoldo Morale in the office since her admission.  No other aggravating or alleviating factors.        Past Medical History:  Diagnosis Date  . Anemia   . Anxiety   . Arthritis   . Bilateral renal cysts   . Cholecystitis   . Chronic diastolic heart failure ([place])   . Depression   . Essential hypertension   . History of cellulitis   . History of CVA (cerebrovascular accident)   . History of endocarditis   . Hyperlipemia   . Morbid obesity ([place])   . Neuropathy   . Right bundle branch block (RBBB)   . 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 Severe aortic stenosis   . Sleep apnea     Patient Active  Problem List   Diagnosis Date Noted  . Enteritis 07/05/2019  . 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  . Cholelithiasis 07/05/2019  . Gallstone ileus ([place])   . Kidney lesion, native, right 07/11/2018  . 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 Sleep apnea   . Hypertension   . Hyperlipemia   . Chronic diastolic CHF (congestive heart failure), NYHA class 2 ([place])   . Neuropathy   . Physical deconditioning   . History of TIA (transient ischemic attack)   . Right bundle branch block (RBBB)   . Obesity, Class III, BMI 40-49.9 (morbid obesity) ([place])   . Junctional bradycardia   . Preoperative cardiovascular examination   . Severe aortic stenosis   . Choledocholithiasis   . Epigastric pain 02/06/2018    Past Surgical History:  Procedure Laterality Date  . Abdominal gangrene    . ADENOIDECTOMY    . Cataract surgery    . COLONOSCOPY    . ENDOSCOPIC RETROGRADE CHOLANGIOPANCREATOGRAPHY (ERCP) WITH PROPOFOL N/A 02/08/2018   Procedure: ENDOSCOPIC RETROGRADE CHOLANGIOPANCREATOGRAPHY (ERCP) WITH PROPOFOL;  Surgeon: Rush Landmark Telford Nab., MD;  Location: [facility];  Service: Gastroenterology;  Laterality: N/A;  . EUS  02/08/2018   Procedure: UPPER ENDOSCOPIC ULTRASOUND (EUS) LINEAR;  Surgeon: Irving Copas., MD;  Location: [facility];  Service: Gastroenterology;;  . EYE SURGERY    . IR FLUORO RM 30-60 MIN  03/27/2018  .  IR PERC CHOLECYSTOSTOMY  02/09/2018  . IR RADIOLOGIST EVAL & MGMT  03/26/2018  . IR RADIOLOGY PERIPHERAL GUIDED IV START  05/22/2018  . IR RADIOLOGY PERIPHERAL GUIDED IV START  05/30/2018  . IR RADIOLOGY PERIPHERAL GUIDED IV START  06/05/2018  . IR US GUIDE VASC ACCESS RIGHT  05/22/2018  . IR US GUIDE VASC ACCESS RIGHT  05/30/2018  . IR US GUIDE VASC ACCESS RIGHT  06/05/2018  . PACEMAKER IMPLANT N/A 07/10/2018   Procedure: PACEMAKER IMPLANT;  Surgeon: Marinus Maw, MD;  [facility] INVASIVE CV LAB;   Service: Cardiovascular;  Laterality: N/A;  . REMOVAL OF STONES  02/08/2018   Procedure: REMOVAL OF STONES;  Surgeon: Lemar Lofty., MD;  Location: [facility] ENDOSCOPY;  Service: Gastroenterology;;  . Dennison Mascot  02/08/2018   Procedure: Dennison Mascot;  Surgeon: Mansouraty, Netty Starring., MD;  Location: [facility] ENDOSCOPY;  Service: Gastroenterology;;  . TONSILLECTOMY    . TOOTH EXTRACTION N/A 06/18/2018   Procedure: DENTAL RESTORATION/EXTRACTIONS;  Surgeon: Ocie Doyne, DDS;  Location: [facility] OR;  Service: Oral Surgery;  Laterality: N/A;  . TRANSCATHETER AORTIC VALVE REPLACEMENT, TRANSFEMORAL N/A 07/09/2018   Procedure: TRANSCATHETER AORTIC VALVE REPLACEMENT, TRANSFEMORAL;  Surgeon: Tonny Bollman, MD;  Location: [facility] INVASIVE CV LAB;  Service: Cardiovascular;  Laterality: N/A;  . Tummy tuck       OB History   No obstetric history on file.     Family History  Problem Relation Age of Onset  . Cancer Mother   . Hypertension Father   . Arthritis Father   . Heart attack Father     Social History   Tobacco Use  . Smoking status: Former Smoker    Types: Cigarettes    Quit date: 05/23/1978    Years since quitting: 41.2  . Smokeless tobacco: Never Used  . Tobacco comment: only 1 pack pewr week   Substance Use Topics  . Alcohol use: Not Currently    Comment: Occasional  . Drug use: Never    Home Medications Prior to Admission medications   Medication Sig Start Date End Date Taking? Authorizing Provider  acetaminophen (TYLENOL) 325 MG tablet Take 2 tablets (650 mg total) by mouth every 6 (six) hours as needed for mild pain, fever or headache (or Fever >/= 101). 07/10/19   Shon Hale, MD  aspirin EC 81 MG tablet Take 1 tablet (81 mg total) by mouth daily with breakfast. 07/10/19   Shon Hale, MD  Calcium Carbonate (CALCIUM-CARB 600 PO) Take 600 mg by mouth daily.    [provider]  cholecalciferol (VITAMIN D) 1000 units tablet Take 1,000 Units by mouth daily.     [provider]  ferrous sulfate 325 (65 FE) MG tablet Take 325 mg by mouth daily with breakfast.    [provider]  gabapentin (NEURONTIN) 300 MG capsule Take 300 mg by mouth 3 (three) times daily.    [provider]  Multiple Vitamin (MULTIVITAMIN WITH MINERALS) TABS tablet Take 1 tablet by mouth daily.    [provider]  pantoprazole (PROTONIX) 40 MG tablet Take 1 tablet (40 mg total) by mouth daily. 07/10/19 07/09/20  Shon Hale, MD  polyethylene glycol (MIRALAX MIX-IN PAX) 17 g packet Take 17 g by mouth daily as needed for mild constipation or moderate constipation. 07/10/19   Shon Hale, MD  pravastatin (PRAVACHOL) 20 MG tablet Take 1 tablet (20 mg total) by mouth at bedtime. 07/10/19   Shon Hale, MD  silver sulfADIAZINE (SILVADENE) 1 % cream Apply 1  application topically 2 (two) times daily. 05/30/19   [provider]  traMADol (ULTRAM) 50 MG tablet Take 50 mg by mouth every 6 (six) hours as needed for moderate pain.    [provider]  venlafaxine (EFFEXOR) 37.5 MG tablet Take 37.5 mg by mouth daily.    [provider]  vitamin C (ASCORBIC ACID) 500 MG tablet Take 500 mg by mouth daily.    [provider]    Allergies    Patient has no known allergies.  Review of Systems   Review of Systems  Constitutional: Negative for chills and fever.  Respiratory: Negative for cough and shortness of breath.   Cardiovascular: Negative for chest pain.  Gastrointestinal: Positive for abdominal pain, nausea and vomiting. Negative for blood in stool, constipation and diarrhea.  Genitourinary: Negative for dysuria, flank pain and frequency.  Musculoskeletal: Negative for arthralgias and myalgias.  Skin: Negative for color change and rash.  Neurological: Negative for dizziness, syncope and light-headedness.  All other systems reviewed and are negative.   Physical Exam Updated Vital Signs BP (!) 145/55 (BP  Location: Right Arm)   Pulse 80   Temp 98.5 F (36.9 C) (Oral)   Resp (!) 22   Physical Exam Vitals and nursing note reviewed.  Constitutional:      General: She is not in acute distress.    Appearance: She is well-developed. She is obese. She is not ill-appearing or diaphoretic.     Comments: Obese female, well-appearing and in no distress  HENT:     Head: Normocephalic and atraumatic.     Mouth/Throat:     Mouth: Mucous membranes are moist.     Pharynx: Oropharynx is clear.  Eyes:     General:        Right eye: No discharge.        Left eye: No discharge.  Cardiovascular:     Rate and Rhythm: Normal rate and regular rhythm.     Heart sounds: Normal heart sounds. No murmur. No friction rub. No gallop.   Pulmonary:     Effort: Pulmonary effort is normal. No respiratory distress.     Breath sounds: Normal breath sounds. No wheezing or rales.     Comments: Respirations equal and unlabored, patient able to speak in full sentences, lungs clear to auscultation bilaterally Abdominal:     General: Bowel sounds are normal. There is no distension.     Palpations: Abdomen is soft. There is no mass.     Tenderness: There is abdominal tenderness in the right upper quadrant. There is no guarding. Negative signs include Murphy's sign.     Comments: Abdomen soft, nondistended, bowel sounds present throughout, focal tenderness noted in the right upper quadrant, but without guarding or true Murphy sign, all other quadrants nontender to palpation  Musculoskeletal:        General: No deformity.     Cervical back: Neck supple.  Skin:    General: Skin is warm and dry.     Capillary Refill: Capillary refill takes less than 2 seconds.  Neurological:     Mental Status: She is alert and oriented to person, place, and time.     Coordination: Coordination normal.     Comments: Speech is clear, able to follow commands Moves extremities without ataxia, coordination intact  Psychiatric:        Mood  and Affect: Mood normal.        Behavior: Behavior normal.     ED Results /  Procedures / Treatments   Labs (all labs ordered are listed, but only abnormal results are displayed) Labs Reviewed  COMPREHENSIVE METABOLIC PANEL - Abnormal; Notable for the following components:      Result Value   Glucose, Bld 122 (*)    All other components within normal limits  LIPASE, BLOOD  CBC    EKG None  Radiology CT ABDOMEN PELVIS WO CONTRAST  Result Date: 08/13/2019 CLINICAL DATA:  Right upper quadrant pain with nausea and vomiting EXAM: CT ABDOMEN AND PELVIS WITHOUT CONTRAST TECHNIQUE: Multidetector CT imaging of the abdomen and pelvis was performed following the standard protocol without IV contrast. COMPARISON:  CT 07/08/2019, 07/04/2019 MRI 02/06/2018, 09/18/2018 FINDINGS: Lower chest: Lung bases demonstrate no acute consolidation or pleural effusion. Cardiomegaly. Aortic valve stent graft. Partially visualized intracardiac pacing leads. Hepatobiliary: No focal hepatic abnormality. Redemonstrated pneumobilia. Thick walled gallbladder with air in the lumen as before. Mild soft tissue stranding in the region. Pancreas: Unremarkable. No pancreatic ductal dilatation or surrounding inflammatory changes. Spleen: Normal in size without focal abnormality. Adrenals/Urinary Tract: Adrenal glands are within normal limits. Kidneys show no hydronephrosis. Indeterminate lesion off the lower pole right kidney better seen with contrast. Cyst in the midpole on the left. The bladder is unremarkable Stomach/Bowel: The stomach is nonenlarged. Wall thickening of the duodenum with surrounding inflammatory process. Suspected fistula between the duodenal bulb and the gallbladder, series 2, image number 29. Lamellated appearing density within the duodenal bulb measuring 2.7 cm suspected to represent a gallstone. The small bowel distal to this is nondilated. Negative appendix. No colon wall thickening. Vascular/Lymphatic:  Moderate aortic atherosclerosis without aneurysm. No suspicious adenopathy. Reproductive: Uterus and bilateral adnexa are unremarkable. Other: Negative for free air or free fluid. Musculoskeletal: Advanced degenerative changes. No acute or suspicious osseous abnormality IMPRESSION: 1. Redemonstrated pneumobilia with air in the gallbladder, gallbladder wall thickening and surrounding inflammatory changes in the right upper quadrant. 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 There is duodenal wall thickening and inflammatory change. No free air. 2. Indeterminate right renal lesion previously evaluated, refer to prior MRI for follow-up recommendations. Electronically Signed   By: Jasmine Pang M.D.   On: 08/13/2019 00:08     Procedures Procedures (including critical care time)  Medications Ordered in ED Medications  sodium chloride 0.9 % bolus 1,000 mL (1,000 mLs Intravenous Not Given 08/12/19 2347)  iohexol (OMNIPAQUE) 300 MG/ML solution 100 mL (has no administration in time range)  fentaNYL (SUBLIMAZE) injection 50 mcg (50 mcg Intramuscular Given 08/12/19 2352)  ondansetron (ZOFRAN-ODT) disintegrating tablet 4 mg (4 mg Oral Given 08/12/19 2353)    ED Course  I have reviewed the triage vital signs and the nursing notes.  Pertinent labs & imaging results that were available during my care of the patient were reviewed by me and considered in my medical decision making (see chart for details).    MDM Rules/Calculators/A&P                      79 year old female presents with right upper quadrant abdominal pain, known history of gallbladder disease, and this feels similar to pain she has had previously.  History of recent gallbladder ileus, but patient is not having any difficulty passing stools or flatus today.  Unfortunately ultrasound is not available at this hour but due to tenderness and known disease we will get  labs and CT abdomen pelvis.  Patient very difficult to  obtain IV access on, will scan without contrast, she has enough abdominal fat that I feel we will still be able to get a good view of the gallbladder and abdomen.  IM pain and nausea medicine given.  Labs and imaging ordered, reviewed and interpreted by myself.  Lab work is very reassuring with no leukocytosis and normal hemoglobin.  Glucose of 122 but no other electrolyte derangements, no elevation in LFTs and normal kidney function.  Normal lipase.  Labs are reassuring and do not suggest a biliary obstruction.  CT abdomen pelvis shows redemonstrated pneumobilia with air in the gallbladder, gallbladder wall thickening and inflammatory changes in the right upper quadrant, suspected fistula between the gallbladder and duodenum with probable 2.7 cm lamellated stone visualized in the duodenum, there is some duodenal wall thickening inflammatory change but no free air.  These findings seem fairly consistent with imaging during patient's recent admission.  I discussed these findings with Dr. Preston Fleeting who felt that given that these changes are fairly stable compared to recent CT and lab work is very reassuring that there would unlikely be any surgical intervention at this time.  On reevaluation patient is now very comfortable with resolution in pain and she is able to keep down fluids.  Dr. Preston Fleeting recommends discharge home with outpatient surgery follow-up, and patient is in agreement with this plan.  Return precautions discussed.  She has tramadol at home, which she did not try for the symptoms today prior to coming in, she is also been prescribed Zofran.  Final Clinical Impression(s) / ED Diagnoses Final diagnoses:  Right upper quadrant abdominal pain  Fistula of gallbladder    Rx / DC Orders ED Discharge Orders         Ordered    ondansetron (ZOFRAN ODT) 4 MG disintegrating tablet     08/13/19 0053           Dartha Lodge, PA-C 08/15/19 1122     Dione Booze, MD 08/16/19 [id number]

## 2019-08-12 NOTE — ED Triage Notes (Signed)
Pt presents to ED via [facility] for RUQ abdominal pain, nausea, vomiting x 1 today.

## 2019-08-13 DIAGNOSIS — R279 Unspecified lack of coordination: Secondary | ICD-10-CM | POA: Diagnosis not present

## 2019-08-13 DIAGNOSIS — Z743 Need for continuous supervision: Secondary | ICD-10-CM | POA: Diagnosis not present

## 2019-08-13 MED ORDER — ONDANSETRON 4 MG PO TBDP: ORAL_TABLET | ORAL | 0 refills | Status: DC

## 2019-08-13 NOTE — Discharge Instructions (Addendum)
Your lab work today is very reassuring, your CT scan shows known gallbladder disease with fistula between your gallbladder and small bowel.  There appears to be a gallstone in your small intestine.  These findings are not significantly changed from your recent CT, you continue to have some inflammation in this area.  Since your pain has been well controlled here in the ED please continue to treat pain at home with tramadol, use prescribed Zofran as needed for nausea and vomiting.  Please call to schedule follow-up appointment with Dr. Lovell Sheehan with general surgery, he saw you during her recent hospitalization, and wanted to wait to do any surgery on the gallbladder until inflammation had improved.

## 2019-08-15 ENCOUNTER — Other Ambulatory Visit: Payer: Self-pay

## 2019-08-15 ENCOUNTER — Emergency Department (HOSPITAL_COMMUNITY)
Admission: EM | Admit: 2019-08-15 | Discharge: 2019-08-16 | Disposition: A | Discharge status: Home / Self Care | Payer: Medicare PPO | Attending: Emergency Medicine | Admitting: Emergency Medicine

## 2019-08-15 ENCOUNTER — Encounter (HOSPITAL_COMMUNITY): Payer: Self-pay | Admitting: Pediatrics

## 2019-08-15 ENCOUNTER — Emergency Department (HOSPITAL_COMMUNITY): Payer: Medicare PPO

## 2019-08-15 DIAGNOSIS — R1013 Epigastric pain: Secondary | ICD-10-CM | POA: Diagnosis not present

## 2019-08-15 DIAGNOSIS — Z87891 Personal history of nicotine dependence: Secondary | ICD-10-CM | POA: Insufficient documentation

## 2019-08-15 DIAGNOSIS — N2889 Other specified disorders of kidney and ureter: Secondary | ICD-10-CM | POA: Diagnosis not present

## 2019-08-15 DIAGNOSIS — M180 Bilateral primary osteoarthritis of first carpometacarpal joints: Secondary | ICD-10-CM | POA: Diagnosis not present

## 2019-08-15 DIAGNOSIS — I110 Hypertensive heart disease with heart failure: Secondary | ICD-10-CM | POA: Diagnosis not present

## 2019-08-15 DIAGNOSIS — I5032 Chronic diastolic (congestive) heart failure: Secondary | ICD-10-CM | POA: Insufficient documentation

## 2019-08-15 DIAGNOSIS — Z79899 Other long term (current) drug therapy: Secondary | ICD-10-CM | POA: Insufficient documentation

## 2019-08-15 DIAGNOSIS — E782 Mixed hyperlipidemia: Secondary | ICD-10-CM | POA: Diagnosis not present

## 2019-08-15 DIAGNOSIS — R1011 Right upper quadrant pain: Secondary | ICD-10-CM | POA: Insufficient documentation

## 2019-08-15 DIAGNOSIS — Z8673 Personal history of transient ischemic attack (TIA), and cerebral infarction without residual deficits: Secondary | ICD-10-CM | POA: Diagnosis not present

## 2019-08-15 DIAGNOSIS — Z950 Presence of cardiac pacemaker: Secondary | ICD-10-CM | POA: Insufficient documentation

## 2019-08-15 DIAGNOSIS — R109 Unspecified abdominal pain: Secondary | ICD-10-CM

## 2019-08-15 DIAGNOSIS — F331 Major depressive disorder, recurrent, moderate: Secondary | ICD-10-CM | POA: Diagnosis not present

## 2019-08-15 DIAGNOSIS — Z952 Presence of prosthetic heart valve: Secondary | ICD-10-CM | POA: Insufficient documentation

## 2019-08-15 DIAGNOSIS — M170 Bilateral primary osteoarthritis of knee: Secondary | ICD-10-CM | POA: Diagnosis not present

## 2019-08-15 DIAGNOSIS — Z7982 Long term (current) use of aspirin: Secondary | ICD-10-CM | POA: Diagnosis not present

## 2019-08-15 DIAGNOSIS — G629 Polyneuropathy, unspecified: Secondary | ICD-10-CM | POA: Diagnosis not present

## 2019-08-15 DIAGNOSIS — I350 Nonrheumatic aortic (valve) stenosis: Secondary | ICD-10-CM | POA: Diagnosis not present

## 2019-08-15 DIAGNOSIS — K828 Other specified diseases of gallbladder: Secondary | ICD-10-CM | POA: Diagnosis not present

## 2019-08-15 LAB — CBC
HCT: 39.6 % (ref 36.0–46.0)
Hemoglobin: 12.7 g/dL (ref 12.0–15.0)
MCH: 29.9 pg (ref 26.0–34.0)
MCHC: 32.1 g/dL (ref 30.0–36.0)
MCV: 93.2 fL (ref 80.0–100.0)
Platelets: 310 10*3/uL (ref 150–400)
RBC: 4.25 MIL/uL (ref 3.87–5.11)
RDW: 14.2 % (ref 11.5–15.5)
WBC: 8.2 10*3/uL (ref 4.0–10.5)
nRBC: 0 % (ref 0.0–0.2)

## 2019-08-15 LAB — COMPREHENSIVE METABOLIC PANEL
ALT: 11 U/L (ref 0–44)
AST: 17 U/L (ref 15–41)
Albumin: 3.5 g/dL (ref 3.5–5.0)
Alkaline Phosphatase: 68 U/L (ref 38–126)
Anion gap: 10 (ref 5–15)
BUN: 16 mg/dL (ref 8–23)
CO2: 30 mmol/L (ref 22–32)
Calcium: 9.7 mg/dL (ref 8.9–10.3)
Chloride: 100 mmol/L (ref 98–111)
Creatinine, Ser: 0.71 mg/dL (ref 0.44–1.00)
GFR calc Af Amer: >60 mL/min (ref >60)
GFR calc non Af Amer: >60 mL/min (ref >60)
Glucose, Bld: 110 mg/dL — ABNORMAL HIGH (ref 70–99)
Potassium: 4.4 mmol/L (ref 3.5–5.1)
Sodium: 140 mmol/L (ref 135–145)
Total Bilirubin: 0.8 mg/dL (ref 0.3–1.2)
Total Protein: 6.8 g/dL (ref 6.5–8.1)

## 2019-08-15 LAB — LIPASE, BLOOD: Lipase: 17 U/L (ref 11–51)

## 2019-08-15 MED ORDER — SODIUM CHLORIDE 0.9% FLUSH: 3.0000 mL | Freq: Once | INTRAVENOUS | Status: DC

## 2019-08-15 NOTE — ED Provider Notes (Signed)
[facility] Provider Note   CSN: [id number] Arrival date & time: 08/15/19  1245     History Chief Complaint  Patient presents with  . Abdominal Pain    [facility] is a 79 y.o. female.  HPI     79 year old female comes in a chief complaint of abdominal pain.  Patient has history of gallstone ileus, hyperlipidemia.  She reports that she was recently seen in the hospital for gallbladder complication and nobody took her gallbladder out.  She started having current abdominal pain yesterday.  The pain is constant and located in the right upper quadrant.  There is associated nausea and vomiting without any diarrhea.  Patient returns to the ER because she would like the gallbladder to be taken out.  She currently has an appointment with surgery at the end of the month.  Past Medical History:  Diagnosis Date  . Anemia   . Anxiety   . Arthritis   . Bilateral renal cysts   . Cholecystitis   . Chronic diastolic heart failure (HCC)   . Depression   . Essential hypertension   . History of cellulitis   . History of CVA (cerebrovascular accident)   . History of endocarditis   . Hyperlipemia   . Morbid obesity (HCC)   . Neuropathy   . Right bundle branch block (RBBB)   . 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 Severe aortic stenosis   . Sleep apnea     Patient Active Problem List   Diagnosis Date Noted  . Enteritis 07/05/2019  . 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  . Cholelithiasis 07/05/2019  . Gallstone ileus (HCC)   . Kidney lesion, native, right 07/11/2018  . 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 Sleep apnea   . Hypertension   . Hyperlipemia   . Chronic diastolic CHF (congestive heart failure), NYHA class 2 (HCC)   . Neuropathy    . Physical deconditioning   . History of TIA (transient ischemic attack)   . Right bundle branch block (RBBB)   . Obesity, Class III, BMI 40-49.9 (morbid obesity) (HCC)   . Junctional bradycardia   . Preoperative cardiovascular examination   . Severe aortic stenosis   . Choledocholithiasis   . Epigastric pain 02/06/2018    Past Surgical History:  Procedure Laterality Date  . Abdominal gangrene    . ADENOIDECTOMY    . Cataract surgery    . COLONOSCOPY    . ENDOSCOPIC RETROGRADE CHOLANGIOPANCREATOGRAPHY (ERCP) WITH PROPOFOL N/A 02/08/2018   Procedure: ENDOSCOPIC RETROGRADE CHOLANGIOPANCREATOGRAPHY (ERCP) WITH PROPOFOL;  Surgeon: Meridee Score Netty Starring., MD;  Location: [facility] ENDOSCOPY;  Service: Gastroenterology;  Laterality: N/A;  . EUS  02/08/2018   Procedure: UPPER ENDOSCOPIC ULTRASOUND (EUS) LINEAR;  Surgeon: Lemar Lofty., MD;  Location: [facility] ENDOSCOPY;  Service: Gastroenterology;;  . EYE SURGERY    . IR FLUORO RM 30-60 MIN  03/27/2018  . IR PERC CHOLECYSTOSTOMY  02/09/2018  . IR RADIOLOGIST EVAL & MGMT  03/26/2018  . IR RADIOLOGY PERIPHERAL GUIDED IV START  05/22/2018  . IR RADIOLOGY PERIPHERAL GUIDED IV START  05/30/2018  . IR RADIOLOGY PERIPHERAL GUIDED IV START  06/05/2018  . IR US GUIDE VASC ACCESS RIGHT  05/22/2018  . IR US GUIDE VASC ACCESS RIGHT  05/30/2018  . IR US GUIDE VASC  ACCESS RIGHT  06/05/2018  . PACEMAKER IMPLANT N/A 07/10/2018   Procedure: PACEMAKER IMPLANT;  Surgeon: Evans Lance, MD;  Location: [place] [facility];  Service: Cardiovascular;  Laterality: N/A;  . REMOVAL OF STONES  02/08/2018   Procedure: REMOVAL OF STONES;  Surgeon: Irving Copas., MD;  Location: [place];  Service: Gastroenterology;;  . Joan Mayans  02/08/2018   Procedure: Joan Mayans;  Surgeon: Mansouraty, Telford Nab., MD;  Location: [place];  Service: Gastroenterology;;  . TONSILLECTOMY    . TOOTH EXTRACTION N/A 06/18/2018   Procedure: DENTAL  RESTORATION/EXTRACTIONS;  Surgeon: Diona Browner, DDS;  Location: [place];  Service: Oral Surgery;  Laterality: N/A;  . TRANSCATHETER AORTIC VALVE REPLACEMENT, TRANSFEMORAL N/A 07/09/2018   Procedure: TRANSCATHETER AORTIC VALVE REPLACEMENT, TRANSFEMORAL;  Surgeon: Sherren Mocha, MD;  Location: [place] [facility];  Service: Cardiovascular;  Laterality: N/A;  . Tummy tuck       OB History   No obstetric history on file.     Family History  Problem Relation Age of Onset  . Cancer Mother   . Hypertension Father   . Arthritis Father   . Heart attack Father     Social History   Tobacco Use  . Smoking status: Former Smoker    Types: Cigarettes    Quit date: 05/23/1978    Years since quitting: 41.2  . Smokeless tobacco: Never Used  . Tobacco comment: only 1 pack pewr week   Substance Use Topics  . Alcohol use: Not Currently    Comment: Occasional  . Drug use: Never    Home Medications Prior to Admission medications   Medication Sig Start Date End Date Taking? Authorizing Provider  acetaminophen (TYLENOL) 325 MG tablet Take 2 tablets (650 mg total) by mouth every 6 (six) hours as needed for mild pain, fever or headache (or Fever >/= 101). 07/10/19  Yes Roxan Hockey, MD  aspirin EC 81 MG tablet Take 1 tablet (81 mg total) by mouth daily with breakfast. 07/10/19  Yes Emokpae, Courage, MD  Calcium Carbonate (CALCIUM-CARB 600 PO) Take 600 mg by mouth daily.   Yes [provider]  cholecalciferol (VITAMIN D) 1000 units tablet Take 1,000 Units by mouth daily.   Yes [provider]  ferrous sulfate 325 (65 FE) MG tablet Take 325 mg by mouth daily with breakfast.   Yes [provider]  gabapentin (NEURONTIN) 300 MG capsule Take 300 mg by mouth 3 (three) times daily.   Yes [provider]  Multiple Vitamin (MULTIVITAMIN WITH MINERALS) TABS tablet Take 1 tablet by mouth daily.   Yes [provider]  ondansetron (ZOFRAN ODT) 4 MG disintegrating  tablet 4mg  ODT q4 hours prn nausea/vomit Patient taking differently: Take 4 mg by mouth every 4 (four) hours as needed for nausea or vomiting.  08/13/19  Yes Jacqlyn Larsen, PA-C  pantoprazole (PROTONIX) 40 MG tablet Take 1 tablet (40 mg total) by mouth daily. 07/10/19 07/09/20 Yes Emokpae, Courage, MD  polyethylene glycol (MIRALAX MIX-IN PAX) 17 g packet Take 17 g by mouth daily as needed for mild constipation or moderate constipation. 07/10/19  Yes Emokpae, Courage, MD  pravastatin (PRAVACHOL) 20 MG tablet Take 1 tablet (20 mg total) by mouth at bedtime. 07/10/19  Yes Emokpae, Courage, MD  silver sulfADIAZINE (SILVADENE) 1 % cream Apply 1 application topically 2 (two) times daily. 05/30/19  Yes [provider]  traMADol (ULTRAM) 50 MG tablet Take 50 mg by mouth every 6 (six) hours as needed for  moderate pain.   Yes [provider]  venlafaxine (EFFEXOR) 37.5 MG tablet Take 37.5 mg by mouth daily.   Yes [provider]  vitamin C (ASCORBIC ACID) 500 MG tablet Take 500 mg by mouth daily.   Yes [provider]    Allergies    Patient has no known allergies.  Review of Systems   Review of Systems  Constitutional: Positive for activity change.  Gastrointestinal: Positive for abdominal pain, nausea and vomiting.  All other systems reviewed and are negative.   Physical Exam Updated Vital Signs BP (!) 156/83   Pulse 65   Temp 98.6 F (37 C) (Oral)   Resp 20   Ht 5\' 6"  (1.676 m)   Wt 121.6 kg   SpO2 97%   BMI 43.26 kg/m   Physical Exam Vitals and nursing note reviewed.  Constitutional:      Appearance: She is well-developed.  HENT:     Head: Normocephalic and atraumatic.  Eyes:     Pupils: Pupils are equal, round, and reactive to light.  Cardiovascular:     Rate and Rhythm: Normal rate and regular rhythm.     Heart sounds: Normal heart sounds. No murmur.  Pulmonary:     Effort: Pulmonary effort is normal. No respiratory distress.  Abdominal:      General: There is no distension.     Palpations: Abdomen is soft.     Tenderness: There is abdominal tenderness in the right upper quadrant and epigastric area. There is no guarding or rebound. Negative signs include Murphy's sign.  Musculoskeletal:     Cervical back: Neck supple.  Skin:    General: Skin is warm and dry.  Neurological:     Mental Status: She is alert and oriented to person, place, and time.     ED Results / Procedures / Treatments   Labs (all labs ordered are listed, but only abnormal results are displayed) Labs Reviewed  COMPREHENSIVE METABOLIC PANEL - Abnormal; Notable for the following components:      Result Value   Glucose, Bld 110 (*)    All other components within normal limits  LIPASE, BLOOD  CBC  URINALYSIS, ROUTINE W REFLEX MICROSCOPIC    EKG None  Radiology Abdomen Limited RUQ  Result Date: 08/15/2019 CLINICAL DATA:  Upper abdominal pain for 1 day, known history of gallstones EXAM: ULTRASOUND ABDOMEN LIMITED RIGHT UPPER QUADRANT COMPARISON:  08/12/2019 FINDINGS: Gallbladder: Gallbladder demonstrates thickened wall to 4 mm. Negative sonographic Murphy's sign is noted. Echogenicity is noted within the gallbladder lumen which when compared with the prior CT examination likely represents air. Common bile duct: Diameter: 4.2 mm. Liver: No focal lesion identified. Within normal limits in parenchymal echogenicity. Portal vein is patent on color Doppler imaging with normal direction of blood flow towards the liver. Echogenicity is noted within the left lobe of the liver likely related to pneumobilia as previously seen. Other: None. IMPRESSION: Gallbladder wall thickening with echogenicity within consistent with air as seen previously. It would be difficult to exclude some underlying gallstones as well. Previously seen pneumobilia is less well appreciated on today's exam. Electronically Signed   By: 08/14/2019 M.D.   On: 08/15/2019 22:00     Procedures Procedures (including critical care time)  Medications Ordered in ED Medications  sodium chloride flush (NS) 0.9 % injection 3 mL (has no administration in time range)    ED Course  I have reviewed the triage vital signs and the nursing notes.  Pertinent labs & imaging results that were available during my care of the patient were reviewed by me and considered in my medical decision making (see chart for details).    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  Differential diagnosis includes gallstone ileus.  Cholecystitis and complication from her fistulization also possible including perforation.  Patient's abdominal exam is not showing any peritoneal findings.  Ultrasound was ordered and there is goal letter wall thickening.  I discussed the case with Dr. Corliss Skains.  He reports that it is better to get a CT scan to see if the primary issue is cholelithiasis versus gallstone ileus again.  CT ordered.  Patient's care will be likely transferred to the incoming team, who will need to follow-up with the CT scan finding and contact surgery if needed.  Final Clinical Impression(s) / ED Diagnoses Final diagnoses:  Abdominal pain    Rx / DC Orders ED Discharge Orders    None       Derwood Kaplan, MD 08/15/19 2249

## 2019-08-15 NOTE — ED Triage Notes (Signed)
Patient stated has had problems with her gallbladder for a while and reported passing a stone; c/o vomiting all morning as well. Stated RUQ pain.

## 2019-08-16 ENCOUNTER — Emergency Department (HOSPITAL_COMMUNITY): Payer: Medicare PPO

## 2019-08-16 DIAGNOSIS — N2889 Other specified disorders of kidney and ureter: Secondary | ICD-10-CM | POA: Diagnosis not present

## 2019-08-16 MED ORDER — IOHEXOL 300 MG/ML  SOLN
100.0000 mL | Freq: Once | INTRAMUSCULAR | Status: AC | PRN
  Administered 2019-08-16: 03:00:00 100 mL via INTRAVENOUS

## 2019-08-16 MED ORDER — METOCLOPRAMIDE HCL 10 MG PO TABS: 10.0000 mg | ORAL_TABLET | Freq: Four times a day (QID) | ORAL | 0 refills | Status: DC | PRN

## 2019-08-16 NOTE — ED Notes (Signed)
Pt given gingerale and sandwich

## 2019-08-16 NOTE — ED Notes (Signed)
Pt to ct 

## 2019-08-16 NOTE — ED Notes (Signed)
Pt verbalized understanding of d/c instructions, follow up care and s/s requiring return to ed. Pt had no additional questions at this time.  Pt transported via wheelchair to exit

## 2019-08-16 NOTE — ED Notes (Signed)
While sleeping pt repeatedly dropped SpO2 sats into low 80s. Pt placed on 2L via [place]. Will continue to [place]

## 2019-08-20 ENCOUNTER — Inpatient Hospital Stay (HOSPITAL_COMMUNITY)
Admission: EM | Admit: 2019-08-20 | Discharge: 2019-08-28 | DRG: 388 | Disposition: A | Discharge status: Skilled Nursing Facility | Payer: Medicare PPO | Attending: Internal Medicine | Admitting: Internal Medicine

## 2019-08-20 ENCOUNTER — Emergency Department (HOSPITAL_COMMUNITY): Payer: Medicare PPO

## 2019-08-20 ENCOUNTER — Inpatient Hospital Stay (HOSPITAL_COMMUNITY): Payer: Medicare PPO

## 2019-08-20 ENCOUNTER — Other Ambulatory Visit: Payer: Self-pay

## 2019-08-20 ENCOUNTER — Encounter (HOSPITAL_COMMUNITY): Payer: Self-pay | Admitting: *Deleted

## 2019-08-20 DIAGNOSIS — R9431 Abnormal electrocardiogram [ECG] [EKG]: Secondary | ICD-10-CM | POA: Diagnosis present

## 2019-08-20 DIAGNOSIS — R069 Unspecified abnormalities of breathing: Secondary | ICD-10-CM | POA: Diagnosis not present

## 2019-08-20 DIAGNOSIS — E860 Dehydration: Secondary | ICD-10-CM | POA: Diagnosis present

## 2019-08-20 DIAGNOSIS — D72829 Elevated white blood cell count, unspecified: Secondary | ICD-10-CM | POA: Diagnosis present

## 2019-08-20 DIAGNOSIS — Z4659 Encounter for fitting and adjustment of other gastrointestinal appliance and device: Secondary | ICD-10-CM

## 2019-08-20 DIAGNOSIS — Z7401 Bed confinement status: Secondary | ICD-10-CM | POA: Diagnosis not present

## 2019-08-20 DIAGNOSIS — Z953 Presence of xenogenic heart valve: Secondary | ICD-10-CM

## 2019-08-20 DIAGNOSIS — K8020 Calculus of gallbladder without cholecystitis without obstruction: Secondary | ICD-10-CM | POA: Diagnosis not present

## 2019-08-20 DIAGNOSIS — Z0189 Encounter for other specified special examinations: Secondary | ICD-10-CM

## 2019-08-20 DIAGNOSIS — K5669 Other partial intestinal obstruction: Secondary | ICD-10-CM | POA: Diagnosis present

## 2019-08-20 DIAGNOSIS — Z87891 Personal history of nicotine dependence: Secondary | ICD-10-CM

## 2019-08-20 DIAGNOSIS — J189 Pneumonia, unspecified organism: Secondary | ICD-10-CM | POA: Diagnosis not present

## 2019-08-20 DIAGNOSIS — Z8261 Family history of arthritis: Secondary | ICD-10-CM

## 2019-08-20 DIAGNOSIS — Z950 Presence of cardiac pacemaker: Secondary | ICD-10-CM | POA: Diagnosis not present

## 2019-08-20 DIAGNOSIS — Z4682 Encounter for fitting and adjustment of non-vascular catheter: Secondary | ICD-10-CM | POA: Diagnosis not present

## 2019-08-20 DIAGNOSIS — F339 Major depressive disorder, recurrent, unspecified: Secondary | ICD-10-CM | POA: Diagnosis not present

## 2019-08-20 DIAGNOSIS — Z8249 Family history of ischemic heart disease and other diseases of the circulatory system: Secondary | ICD-10-CM

## 2019-08-20 DIAGNOSIS — R0902 Hypoxemia: Principal | ICD-10-CM | POA: Diagnosis present

## 2019-08-20 DIAGNOSIS — N2889 Other specified disorders of kidney and ureter: Secondary | ICD-10-CM | POA: Diagnosis present

## 2019-08-20 DIAGNOSIS — I517 Cardiomegaly: Secondary | ICD-10-CM | POA: Diagnosis not present

## 2019-08-20 DIAGNOSIS — E785 Hyperlipidemia, unspecified: Secondary | ICD-10-CM | POA: Diagnosis present

## 2019-08-20 DIAGNOSIS — I5032 Chronic diastolic (congestive) heart failure: Secondary | ICD-10-CM | POA: Diagnosis not present

## 2019-08-20 DIAGNOSIS — E662 Morbid (severe) obesity with alveolar hypoventilation: Secondary | ICD-10-CM | POA: Diagnosis not present

## 2019-08-20 DIAGNOSIS — D649 Anemia, unspecified: Secondary | ICD-10-CM | POA: Diagnosis not present

## 2019-08-20 DIAGNOSIS — S36498D Other injury of other part of small intestine, subsequent encounter: Secondary | ICD-10-CM | POA: Diagnosis not present

## 2019-08-20 DIAGNOSIS — I712 Thoracic aortic aneurysm, without rupture: Secondary | ICD-10-CM | POA: Diagnosis not present

## 2019-08-20 DIAGNOSIS — J9601 Acute respiratory failure with hypoxia: Secondary | ICD-10-CM | POA: Diagnosis present

## 2019-08-20 DIAGNOSIS — K563 Gallstone ileus: Secondary | ICD-10-CM | POA: Diagnosis not present

## 2019-08-20 DIAGNOSIS — J90 Pleural effusion, not elsewhere classified: Secondary | ICD-10-CM | POA: Diagnosis not present

## 2019-08-20 DIAGNOSIS — E876 Hypokalemia: Secondary | ICD-10-CM | POA: Diagnosis present

## 2019-08-20 DIAGNOSIS — R69 Illness, unspecified: Secondary | ICD-10-CM | POA: Diagnosis not present

## 2019-08-20 DIAGNOSIS — N179 Acute kidney failure, unspecified: Secondary | ICD-10-CM | POA: Diagnosis present

## 2019-08-20 DIAGNOSIS — J9811 Atelectasis: Secondary | ICD-10-CM | POA: Diagnosis present

## 2019-08-20 DIAGNOSIS — I110 Hypertensive heart disease with heart failure: Secondary | ICD-10-CM | POA: Diagnosis present

## 2019-08-20 DIAGNOSIS — I878 Other specified disorders of veins: Secondary | ICD-10-CM | POA: Diagnosis present

## 2019-08-20 DIAGNOSIS — I442 Atrioventricular block, complete: Secondary | ICD-10-CM | POA: Diagnosis not present

## 2019-08-20 DIAGNOSIS — R52 Pain, unspecified: Secondary | ICD-10-CM | POA: Diagnosis not present

## 2019-08-20 DIAGNOSIS — E669 Obesity, unspecified: Secondary | ICD-10-CM | POA: Diagnosis present

## 2019-08-20 DIAGNOSIS — R0689 Other abnormalities of breathing: Secondary | ICD-10-CM

## 2019-08-20 DIAGNOSIS — G4733 Obstructive sleep apnea (adult) (pediatric): Secondary | ICD-10-CM | POA: Diagnosis present

## 2019-08-20 DIAGNOSIS — R5381 Other malaise: Secondary | ICD-10-CM | POA: Diagnosis not present

## 2019-08-20 DIAGNOSIS — R000 Tachycardia, unspecified: Secondary | ICD-10-CM | POA: Diagnosis not present

## 2019-08-20 DIAGNOSIS — R6510 Systemic inflammatory response syndrome (SIRS) of non-infectious origin without acute organ dysfunction: Secondary | ICD-10-CM | POA: Diagnosis present

## 2019-08-20 DIAGNOSIS — R0602 Shortness of breath: Secondary | ICD-10-CM | POA: Diagnosis not present

## 2019-08-20 DIAGNOSIS — Z993 Dependence on wheelchair: Secondary | ICD-10-CM | POA: Diagnosis not present

## 2019-08-20 DIAGNOSIS — Z8673 Personal history of transient ischemic attack (TIA), and cerebral infarction without residual deficits: Secondary | ICD-10-CM

## 2019-08-20 DIAGNOSIS — Z6841 Body Mass Index (BMI) 40.0 and over, adult: Secondary | ICD-10-CM

## 2019-08-20 DIAGNOSIS — I872 Venous insufficiency (chronic) (peripheral): Secondary | ICD-10-CM | POA: Diagnosis present

## 2019-08-20 DIAGNOSIS — I5031 Acute diastolic (congestive) heart failure: Secondary | ICD-10-CM | POA: Diagnosis not present

## 2019-08-20 DIAGNOSIS — K56609 Unspecified intestinal obstruction, unspecified as to partial versus complete obstruction: Secondary | ICD-10-CM

## 2019-08-20 DIAGNOSIS — Z20822 Contact with and (suspected) exposure to covid-19: Secondary | ICD-10-CM | POA: Diagnosis present

## 2019-08-20 DIAGNOSIS — G4730 Sleep apnea, unspecified: Secondary | ICD-10-CM | POA: Diagnosis not present

## 2019-08-20 DIAGNOSIS — G629 Polyneuropathy, unspecified: Secondary | ICD-10-CM | POA: Diagnosis not present

## 2019-08-20 DIAGNOSIS — R0600 Dyspnea, unspecified: Secondary | ICD-10-CM

## 2019-08-20 DIAGNOSIS — I10 Essential (primary) hypertension: Secondary | ICD-10-CM | POA: Diagnosis present

## 2019-08-20 LAB — CBC WITH DIFFERENTIAL/PLATELET
Abs Immature Granulocytes: 0.06 10*3/uL (ref 0.00–0.07)
Basophils Absolute: 0.1 10*3/uL (ref 0.0–0.1)
Basophils Relative: 1 %
Eosinophils Absolute: 0 10*3/uL (ref 0.0–0.5)
Eosinophils Relative: 0 %
HCT: 48.3 % — ABNORMAL HIGH (ref 36.0–46.0)
Hemoglobin: 15.5 g/dL — ABNORMAL HIGH (ref 12.0–15.0)
Immature Granulocytes: 0 %
Lymphocytes Relative: 4 %
Lymphs Abs: 0.6 10*3/uL — ABNORMAL LOW (ref 0.7–4.0)
MCH: 30.1 pg (ref 26.0–34.0)
MCHC: 32.1 g/dL (ref 30.0–36.0)
MCV: 93.8 fL (ref 80.0–100.0)
Monocytes Absolute: 1.8 10*3/uL — ABNORMAL HIGH (ref 0.1–1.0)
Monocytes Relative: 11 %
Neutro Abs: 14.1 10*3/uL — ABNORMAL HIGH (ref 1.7–7.7)
Neutrophils Relative %: 84 %
Platelets: 341 10*3/uL (ref 150–400)
RBC: 5.15 MIL/uL — ABNORMAL HIGH (ref 3.87–5.11)
RDW: 14.9 % (ref 11.5–15.5)
WBC: 16.7 10*3/uL — ABNORMAL HIGH (ref 4.0–10.5)
nRBC: 0 % (ref 0.0–0.2)

## 2019-08-20 LAB — BLOOD GAS, ARTERIAL
Acid-Base Excess: 10.4 mmol/L — ABNORMAL HIGH (ref 0.0–2.0)
Bicarbonate: 32.1 mmol/L — ABNORMAL HIGH (ref 20.0–28.0)
FIO2: 28
O2 Saturation: 90.6 %
Patient temperature: 37
pCO2 arterial: 59.2 mmHg — ABNORMAL HIGH (ref 32.0–48.0)
pH, Arterial: 7.395 (ref 7.350–7.450)
pO2, Arterial: 66.1 mmHg — ABNORMAL LOW (ref 83.0–108.0)

## 2019-08-20 LAB — PROTIME-INR
INR: 1.1 (ref 0.8–1.2)
Prothrombin Time: 14.1 seconds (ref 11.4–15.2)

## 2019-08-20 LAB — URINALYSIS, ROUTINE W REFLEX MICROSCOPIC
Glucose, UA: NEGATIVE mg/dL
Ketones, ur: NEGATIVE mg/dL
Nitrite: NEGATIVE
Protein, ur: 30 mg/dL — AB
Specific Gravity, Urine: 1.023 (ref 1.005–1.030)
pH: 5 (ref 5.0–8.0)

## 2019-08-20 LAB — COMPREHENSIVE METABOLIC PANEL
ALT: 22 U/L (ref 0–44)
AST: 34 U/L (ref 15–41)
Albumin: 3.7 g/dL (ref 3.5–5.0)
Alkaline Phosphatase: 75 U/L (ref 38–126)
Anion gap: 16 — ABNORMAL HIGH (ref 5–15)
BUN: 45 mg/dL — ABNORMAL HIGH (ref 8–23)
CO2: 31 mmol/L (ref 22–32)
Calcium: 9.7 mg/dL (ref 8.9–10.3)
Chloride: 90 mmol/L — ABNORMAL LOW (ref 98–111)
Creatinine, Ser: 2.23 mg/dL — ABNORMAL HIGH (ref 0.44–1.00)
GFR calc Af Amer: 24 mL/min — ABNORMAL LOW (ref >60)
GFR calc non Af Amer: 20 mL/min — ABNORMAL LOW (ref >60)
Glucose, Bld: 133 mg/dL — ABNORMAL HIGH (ref 70–99)
Potassium: 3.8 mmol/L (ref 3.5–5.1)
Sodium: 137 mmol/L (ref 135–145)
Total Bilirubin: 1.1 mg/dL (ref 0.3–1.2)
Total Protein: 7.6 g/dL (ref 6.5–8.1)

## 2019-08-20 LAB — LIPASE, BLOOD: Lipase: 29 U/L (ref 11–51)

## 2019-08-20 LAB — RESPIRATORY PANEL BY RT PCR (FLU A&B, COVID)
Influenza A by PCR: NEGATIVE
Influenza B by PCR: NEGATIVE
SARS Coronavirus 2 by RT PCR: NEGATIVE

## 2019-08-20 LAB — LACTIC ACID, PLASMA
Lactic Acid, Venous: 1.5 mmol/L (ref 0.5–1.9)
Lactic Acid, Venous: 1.3 mmol/L (ref 0.5–1.9)

## 2019-08-20 LAB — BRAIN NATRIURETIC PEPTIDE: B Natriuretic Peptide: 339 pg/mL — ABNORMAL HIGH (ref 0.0–100.0)

## 2019-08-20 LAB — APTT: aPTT: 31 seconds (ref 24–36)

## 2019-08-20 LAB — MAGNESIUM: Magnesium: 2.1 mg/dL (ref 1.7–2.4)

## 2019-08-20 MED ORDER — ACETAMINOPHEN 325 MG PO TABS
650.0000 mg | ORAL_TABLET | Freq: Once | ORAL | Status: AC
  Administered 2019-08-20: 650 mg via ORAL
  Filled 2019-08-20: qty 2

## 2019-08-20 MED ORDER — TECHNETIUM TO 99M ALBUMIN AGGREGATED
1.5000 | Freq: Once | INTRAVENOUS | Status: AC | PRN
  Administered 2019-08-20: 1.5 via INTRAVENOUS

## 2019-08-20 MED ORDER — TECHNETIUM TC 99M DIETHYLENETRIAME-PENTAACETIC ACID
40.0000 | Freq: Once | INTRAVENOUS | Status: AC | PRN
  Administered 2019-08-20: 44 via RESPIRATORY_TRACT

## 2019-08-20 MED ORDER — ENOXAPARIN SODIUM 30 MG/0.3ML ~~LOC~~ SOLN
30.0000 mg | SUBCUTANEOUS | Status: DC
  Administered 2019-08-20: 30 mg via SUBCUTANEOUS
  Filled 2019-08-20: qty 0.3

## 2019-08-20 MED ORDER — ACETAMINOPHEN 325 MG PO TABS
650.0000 mg | ORAL_TABLET | Freq: Four times a day (QID) | ORAL | Status: DC | PRN
  Administered 2019-08-24 – 2019-08-25 (×3): 650 mg via ORAL
  Filled 2019-08-20 (×4): qty 2

## 2019-08-20 MED ORDER — VANCOMYCIN HCL 1750 MG/350ML IV SOLN: 1750.0000 mg | INTRAVENOUS | Status: DC

## 2019-08-20 MED ORDER — PANTOPRAZOLE SODIUM 40 MG IV SOLR
40.0000 mg | INTRAVENOUS | Status: DC
  Administered 2019-08-20 – 2019-08-27 (×8): 40 mg via INTRAVENOUS
  Filled 2019-08-20 (×8): qty 40

## 2019-08-20 MED ORDER — SODIUM CHLORIDE 0.9 % IV SOLN: 2.0000 g | INTRAVENOUS | Status: DC

## 2019-08-20 MED ORDER — VANCOMYCIN HCL 2000 MG/400ML IV SOLN
2000.0000 mg | Freq: Once | INTRAVENOUS | Status: AC
  Administered 2019-08-20: 2000 mg via INTRAVENOUS
  Filled 2019-08-20: qty 400

## 2019-08-20 MED ORDER — SODIUM CHLORIDE 0.9 % IV BOLUS
500.0000 mL | Freq: Once | INTRAVENOUS | Status: AC
  Administered 2019-08-20: 500 mL via INTRAVENOUS

## 2019-08-20 MED ORDER — POTASSIUM CHLORIDE IN NACL 20-0.9 MEQ/L-% IV SOLN: INTRAVENOUS | Status: DC

## 2019-08-20 MED ORDER — SODIUM CHLORIDE 0.9 % IV SOLN: INTRAVENOUS | Status: DC

## 2019-08-20 MED ORDER — SODIUM CHLORIDE 0.9 % IV SOLN
2.0000 g | Freq: Once | INTRAVENOUS | Status: AC
  Administered 2019-08-20: 2 g via INTRAVENOUS
  Filled 2019-08-20: qty 2

## 2019-08-20 MED ORDER — ENOXAPARIN SODIUM 100 MG/ML ~~LOC~~ SOLN
90.0000 mg | Freq: Once | SUBCUTANEOUS | Status: AC
  Administered 2019-08-20: 90 mg via SUBCUTANEOUS
  Filled 2019-08-20: qty 1

## 2019-08-20 MED ORDER — METRONIDAZOLE IN NACL 5-0.79 MG/ML-% IV SOLN
500.0000 mg | Freq: Once | INTRAVENOUS | Status: AC
  Administered 2019-08-20: 500 mg via INTRAVENOUS
  Filled 2019-08-20: qty 100

## 2019-08-20 MED ORDER — VANCOMYCIN HCL IN DEXTROSE 1-5 GM/200ML-% IV SOLN: 1000.0000 mg | Freq: Once | INTRAVENOUS | Status: DC

## 2019-08-20 MED ORDER — ACETAMINOPHEN 650 MG RE SUPP
650.0000 mg | Freq: Four times a day (QID) | RECTAL | Status: DC | PRN
  Administered 2019-08-22: 650 mg via RECTAL
  Filled 2019-08-20: qty 1

## 2019-08-20 MED ORDER — ENOXAPARIN SODIUM 120 MG/0.8ML ~~LOC~~ SOLN
120.0000 mg | SUBCUTANEOUS | Status: DC
  Administered 2019-08-21 – 2019-08-22 (×2): 120 mg via SUBCUTANEOUS
  Filled 2019-08-20 (×2): qty 0.8

## 2019-08-20 NOTE — ED Provider Notes (Signed)
[facility] EMERGENCY DEPARTMENT Provider Note   CSN: [id number] Arrival date & time: 08/20/19  0865     History Chief Complaint  Patient presents with  . Weakness    Dawn Gonzales is a 79 y.o. female.  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  Patient brought in by EMS with complaint of weakness and worsening low back pain that radiates towards the left shoulder.  EMS reported that patient's breathing was shallow and that her oxygen saturations were 80% on room air when they arrived.  Patient states that she does not use any home oxygen.  Patient was placed on 2 L of nasal cannula oxygen and that brought her sats up to 91%.  Her blood sugar was 180.  Patient's main complaint to me was the shortness of breath.  In addition patient complaining of persistent nausea.  Has had some vomiting over the past few days.  Not actively vomiting here in the emergency department.        Past Medical History:  Diagnosis Date  . Anemia   . Anxiety   . Arthritis   . Bilateral renal cysts   . Cholecystitis   . Chronic diastolic heart failure ([place])   . Depression   . Essential hypertension   . History of cellulitis   . History of CVA (cerebrovascular accident)   . History of endocarditis   . Hyperlipemia   . Morbid obesity ([place])   . Neuropathy   . Right bundle branch block (RBBB)   . 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 Severe aortic stenosis   . Sleep apnea     Patient Active Problem List   Diagnosis Date Noted  . Hypoxia 08/20/2019  . Enteritis 07/05/2019  . 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  . Cholelithiasis 07/05/2019  . Gallstone ileus ([place])   . Kidney lesion, native, right 07/11/2018  . 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  Sleep apnea   . Hypertension   . Hyperlipemia   . Chronic diastolic CHF (congestive heart failure), NYHA class 2 ([place])   . Neuropathy   . Physical deconditioning   . History of TIA (transient ischemic attack)   . Right bundle branch block (RBBB)   . Obesity, Class III, BMI 40-49.9 (morbid obesity) ([place])   . Junctional bradycardia   . Preoperative cardiovascular examination   . Severe aortic stenosis   . Choledocholithiasis   . Epigastric pain 02/06/2018    Past Surgical History:  Procedure Laterality Date  . Abdominal gangrene    . ADENOIDECTOMY    . Cataract surgery    . COLONOSCOPY    . ENDOSCOPIC RETROGRADE CHOLANGIOPANCREATOGRAPHY (ERCP) WITH PROPOFOL N/A 02/08/2018   Procedure: ENDOSCOPIC RETROGRADE CHOLANGIOPANCREATOGRAPHY (ERCP) WITH PROPOFOL;  Surgeon: Rush Landmark Telford Nab., MD;  Location: [place];  Service: Gastroenterology;  Laterality: N/A;  . EUS  02/08/2018   Procedure: UPPER ENDOSCOPIC ULTRASOUND (EUS) LINEAR;  Surgeon: Irving Copas., MD;  Location: [place];  Service: Gastroenterology;;  . EYE SURGERY    . IR FLUORO RM 30-60 MIN  03/27/2018  . IR PERC CHOLECYSTOSTOMY  02/09/2018  . IR RADIOLOGIST EVAL & MGMT  03/26/2018  . IR RADIOLOGY PERIPHERAL GUIDED IV START  05/22/2018  . IR RADIOLOGY PERIPHERAL GUIDED IV START  05/30/2018  .  IR RADIOLOGY PERIPHERAL GUIDED IV START  06/05/2018  . IR US GUIDE VASC ACCESS RIGHT  05/22/2018  . IR US GUIDE VASC ACCESS RIGHT  05/30/2018  . IR US GUIDE VASC ACCESS RIGHT  06/05/2018  . PACEMAKER IMPLANT N/A 07/10/2018   Procedure: PACEMAKER IMPLANT;  Surgeon: Marinus Maw, MD;  Location: [facility] INVASIVE CV LAB;  Service: Cardiovascular;  Laterality: N/A;  . REMOVAL OF STONES  02/08/2018   Procedure: REMOVAL OF STONES;  Surgeon: Lemar Lofty., MD;  Location: [facility] ENDOSCOPY;  Service: Gastroenterology;;  . Dennison Mascot  02/08/2018   Procedure: Dennison Mascot;  Surgeon: Mansouraty, Netty Starring., MD;  Location: [facility]  ENDOSCOPY;  Service: Gastroenterology;;  . TONSILLECTOMY    . TOOTH EXTRACTION N/A 06/18/2018   Procedure: DENTAL RESTORATION/EXTRACTIONS;  Surgeon: Ocie Doyne, DDS;  Location: [facility] OR;  Service: Oral Surgery;  Laterality: N/A;  . TRANSCATHETER AORTIC VALVE REPLACEMENT, TRANSFEMORAL N/A 07/09/2018   Procedure: TRANSCATHETER AORTIC VALVE REPLACEMENT, TRANSFEMORAL;  Surgeon: Tonny Bollman, MD;  Location: [facility] INVASIVE CV LAB;  Service: Cardiovascular;  Laterality: N/A;  . Tummy tuck       OB History   No obstetric history on file.     Family History  Problem Relation Age of Onset  . Cancer Mother   . Hypertension Father   . Arthritis Father   . Heart attack Father     Social History   Tobacco Use  . Smoking status: Former Smoker    Types: Cigarettes    Quit date: 05/23/1978    Years since quitting: 41.2  . Smokeless tobacco: Never Used  Substance Use Topics  . Alcohol use: Not Currently    Comment: Occasional  . Drug use: Never    Home Medications Prior to Admission medications   Medication Sig Start Date End Date Taking? Authorizing Provider  acetaminophen (TYLENOL) 325 MG tablet Take 2 tablets (650 mg total) by mouth every 6 (six) hours as needed for mild pain, fever or headache (or Fever >/= 101). 07/10/19  Yes Shon Hale, MD  aspirin EC 81 MG tablet Take 1 tablet (81 mg total) by mouth daily with breakfast. 07/10/19  Yes Emokpae, Courage, MD  Calcium Carbonate (CALCIUM-CARB 600 PO) Take 600 mg by mouth daily.   Yes [provider]  cholecalciferol (VITAMIN D) 1000 units tablet Take 1,000 Units by mouth daily.   Yes [provider]  ferrous sulfate 325 (65 FE) MG tablet Take 325 mg by mouth daily with breakfast.   Yes [provider]  gabapentin (NEURONTIN) 300 MG capsule Take 300 mg by mouth 3 (three) times daily.   Yes [provider]  metoCLOPramide (REGLAN) 10 MG tablet Take 1 tablet (10 mg total) by mouth every 6 (six) hours as  needed for nausea (nausea/headache). 08/16/19  Yes Mesner, Barbara Cower, MD  Multiple Vitamin (MULTIVITAMIN WITH MINERALS) TABS tablet Take 1 tablet by mouth daily.   Yes [provider]  ondansetron (ZOFRAN) 4 MG tablet Take 4 mg by mouth every 8 (eight) hours as needed. 08/19/19  Yes [provider]  pantoprazole (PROTONIX) 40 MG tablet Take 1 tablet (40 mg total) by mouth daily. Patient taking differently: Take 40 mg by mouth 2 (two) times daily.  07/10/19 07/09/20 Yes Emokpae, Courage, MD  polyethylene glycol (MIRALAX MIX-IN PAX) 17 g packet Take 17 g by mouth daily as needed for mild constipation or moderate constipation. 07/10/19  Yes Emokpae, Courage, MD  pravastatin (PRAVACHOL) 20 MG tablet Take 1 tablet (20 mg total)  by mouth at bedtime. 07/10/19  Yes Emokpae, Courage, MD  silver sulfADIAZINE (SILVADENE) 1 % cream Apply 1 application topically 2 (two) times daily. 05/30/19  Yes [provider]  traMADol (ULTRAM) 50 MG tablet Take 50 mg by mouth every 6 (six) hours as needed for moderate pain.   Yes [provider]  venlafaxine (EFFEXOR) 37.5 MG tablet Take 37.5 mg by mouth daily.   Yes [provider]  vitamin C (ASCORBIC ACID) 500 MG tablet Take 500 mg by mouth daily.   Yes [provider]  ondansetron (ZOFRAN ODT) 4 MG disintegrating tablet  ODT q4 hours prn nausea/vomit Patient not taking: Reported on 08/20/2019 08/13/19   Dartha Lodge, PA-C    Allergies    Patient has no known allergies.  Review of Systems   Review of Systems  Constitutional: Positive for fatigue. Negative for chills and fever.  HENT: Negative for congestion, rhinorrhea and sore throat.   Eyes: Negative for visual disturbance.  Respiratory: Positive for shortness of breath. Negative for cough.   Cardiovascular: Negative for chest pain and leg swelling.  Gastrointestinal: Positive for abdominal pain, nausea and vomiting. Negative for diarrhea.  Genitourinary: Negative  for dysuria.  Musculoskeletal: Positive for back pain. Negative for neck pain.  Skin: Negative for rash.  Neurological: Negative for dizziness, light-headedness and headaches.  Hematological: Does not bruise/bleed easily.  Psychiatric/Behavioral: Negative for confusion.    Physical Exam Updated Vital Signs BP 139/76   Pulse (!) 106   Temp 98.3 F (36.8 C) (Oral)   Resp (!) 29   Wt 125.6 kg   SpO2 90%   BMI 44.71 kg/m   Physical Exam Vitals and nursing note reviewed.  Constitutional:      General: She is not in acute distress.    Appearance: Normal appearance. She is well-developed.  HENT:     Head: Normocephalic and atraumatic.  Eyes:     Extraocular Movements: Extraocular movements intact.     Conjunctiva/sclera: Conjunctivae normal.     Pupils: Pupils are equal, round, and reactive to light.  Cardiovascular:     Rate and Rhythm: Regular rhythm. Tachycardia present.     Heart sounds: No murmur.  Pulmonary:     Effort: No respiratory distress.     Breath sounds: Normal breath sounds.  Abdominal:     Palpations: Abdomen is soft.     Tenderness: There is abdominal tenderness.     Comments: Mild diffuse abdominal discomfort.  Not necessarily localized over the right upper quadrant.  Musculoskeletal:        General: Normal range of motion.     Cervical back: Normal range of motion and neck supple.  Skin:    General: Skin is warm and dry.  Neurological:     General: No focal deficit present.     Mental Status: She is alert and oriented to person, place, and time.     ED Results / Procedures / Treatments   Labs (all labs ordered are listed, but only abnormal results are displayed) Labs Reviewed  COMPREHENSIVE METABOLIC PANEL - Abnormal; Notable for the following components:      Result Value   Chloride 90 (*)    Glucose, Bld 133 (*)    BUN 45 (*)    Creatinine, Ser 2.23 (*)    GFR calc non Af Amer 20 (*)    GFR calc Af Amer 24 (*)    Anion gap 16 (*)    All  other components  within normal limits  CBC WITH DIFFERENTIAL/PLATELET - Abnormal; Notable for the following components:   WBC 16.7 (*)    RBC 5.15 (*)    Hemoglobin 15.5 (*)    HCT 48.3 (*)    Neutro Abs 14.1 (*)    Lymphs Abs 0.6 (*)    Monocytes Absolute 1.8 (*)    All other components within normal limits  URINALYSIS, ROUTINE W REFLEX MICROSCOPIC - Abnormal; Notable for the following components:   Color, Urine AMBER (*)    APPearance CLOUDY (*)    Hgb urine dipstick SMALL (*)    Bilirubin Urine SMALL (*)    Protein, ur 30 (*)    Leukocytes,Ua TRACE (*)    Bacteria, UA RARE (*)    All other components within normal limits  BRAIN NATRIURETIC PEPTIDE - Abnormal; Notable for the following components:   B Natriuretic Peptide 339.0 (*)    All other components within normal limits  BLOOD GAS, ARTERIAL - Abnormal; Notable for the following components:   pCO2 arterial 59.2 (*)    pO2, Arterial 66.1 (*)    Bicarbonate 32.1 (*)    Acid-Base Excess 10.4 (*)    All other components within normal limits  CULTURE, BLOOD (ROUTINE X 2)  CULTURE, BLOOD (ROUTINE X 2)  RESPIRATORY PANEL BY RT PCR (FLU A&B, COVID)  URINE CULTURE  LACTIC ACID, PLASMA  LACTIC ACID, PLASMA  APTT  PROTIME-INR  LIPASE, BLOOD  MAGNESIUM    EKG EKG Interpretation  Date/Time:  Wednesday August 20 2019 09:18:27 EDT Ventricular Rate:  114 PR Interval:    QRS Duration: 175 QT Interval:  432 QTC Calculation: 595 R Axis:   -38 Text Interpretation: Sinus tachycardia Right bundle branch block ST depr, consider ischemia, anterolateral lds Confirmed by Vanetta Mulders [phone]) on 08/20/2019 9:20:41 AM   Radiology CT Abdomen Pelvis Wo Contrast  Result Date: 08/20/2019 CLINICAL DATA:  Weakness with worsening low back pain. Awaiting gallbladder surgery for cholecystitis. Shortness of breath. EXAM: CT CHEST, ABDOMEN AND PELVIS WITHOUT CONTRAST TECHNIQUE: Multidetector CT imaging of the chest, abdomen and pelvis was  performed following the standard protocol without IV contrast. COMPARISON:  Abdominopelvic CT 08/16/2019 and 08/12/2019. Chest CTA 05/30/2018. FINDINGS: CT CHEST FINDINGS Cardiovascular: Mild diffuse atherosclerosis of the aorta, great vessels and coronary arteries. No acute vascular findings on noncontrast imaging. Left subclavian pacemaker leads extend into the right atrium and right ventricle. 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 Mitral annular calcifications are noted. No significant pericardial fluid. Mediastinum/Nodes: There are no enlarged mediastinal, hilar or axillary lymph nodes. The thyroid gland, trachea and esophagus demonstrate no significant findings. Lungs/Pleura: Trace dependent left pleural effusion. No pneumothorax. Atelectasis at both lung bases has mildly progressed from the recent abdominal CT. No confluent airspace opacity or significant pulmonary nodularity. Musculoskeletal/Chest wall: No chest wall mass or acute osseous findings. Multilevel thoracic spondylosis. Advanced glenohumeral arthropathy bilaterally. CT ABDOMEN AND PELVIS FINDINGS Hepatobiliary: The liver appears stable as imaged in the noncontrast state. Again demonstrated is diffuse pneumobilia with gas in the gallbladder lumen. There is mild gallbladder wall thickening and surrounding inflammatory change. 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 Pancreas: Atrophy. No pancreatic ductal dilatation or focal surrounding inflammatory change. Spleen: Normal in size without focal abnormality. Adrenals/Urinary Tract: Stable mild thickening of the adrenal glands without suspicious findings. Mild bilateral renal cortical thinning with stable exophytic renal lesions bilaterally. Of note, suspected solid lesion in the lower interpolar region of  the right kidney is not well characterized on the current study due to lack of contrast. No hydronephrosis. The  bladder appears unremarkable for its degree of distention. Stomach/Bowel: The stomach is now markedly distended with fluid. In addition, there is at least moderate proximal to mid small bowel dilatation with scattered air-fluid levels consistent with a distal small bowel obstruction. There appear to be a few loops of distal decompressed small bowel, although the specific transition point is difficult to identify. Based on the prior study and clinical likelihood of gallstone ileus, there may be an obstructing gallstone in the distal small bowel (axial image 65/2 and coronal image 74/5), although this is not definitive. The colon is decompressed. Vascular/Lymphatic: There are no enlarged abdominal or pelvic lymph nodes. Aortic and branch vessel atherosclerosis. Reproductive: The uterus and ovaries appear normal. No adnexal mass. Other: Mild central mesenteric edema without focal extraluminal fluid collection. No free intraperitoneal air. No evidence of abdominal wall hernia. Musculoskeletal: No acute or significant osseous findings. Multilevel lumbar spondylosis. Mild edema throughout the subcutaneous fat. IMPRESSION: 1. Findings are consistent with a high-grade distal small bowel obstruction, likely from an obstructing gallstone in the distal small bowel (gallstone ileus). 2. The stomach is now markedly distended with fluid, and the patient may benefit from nasogastric tube decompression. No evidence of bowel perforation or abscess. 3. 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 4. Mildly progressed bibasilar atelectasis. Trace dependent left pleural effusion. 5. Aortic Atherosclerosis (ICD10-I70.0). Electronically Signed   By: Carey BullocksWilliam  Veazey M.D.   On: 08/20/2019 13:56   CT Chest Wo Contrast  Result Date: 08/20/2019 CLINICAL DATA:  Weakness with worsening low back pain. Awaiting gallbladder surgery for cholecystitis. Shortness of  breath. EXAM: CT CHEST, ABDOMEN AND PELVIS WITHOUT CONTRAST TECHNIQUE: Multidetector CT imaging of the chest, abdomen and pelvis was performed following the standard protocol without IV contrast. COMPARISON:  Abdominopelvic CT 08/16/2019 and 08/12/2019. Chest CTA 05/30/2018. FINDINGS: CT CHEST FINDINGS Cardiovascular: Mild diffuse atherosclerosis of the aorta, great vessels and coronary arteries. No acute vascular findings on noncontrast imaging. Left subclavian pacemaker leads extend into the right atrium and right ventricle. 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 Mitral annular calcifications are noted. No significant pericardial fluid. Mediastinum/Nodes: There are no enlarged mediastinal, hilar or axillary lymph nodes. The thyroid gland, trachea and esophagus demonstrate no significant findings. Lungs/Pleura: Trace dependent left pleural effusion. No pneumothorax. Atelectasis at both lung bases has mildly progressed from the recent abdominal CT. No confluent airspace opacity or significant pulmonary nodularity. Musculoskeletal/Chest wall: No chest wall mass or acute osseous findings. Multilevel thoracic spondylosis. Advanced glenohumeral arthropathy bilaterally. CT ABDOMEN AND PELVIS FINDINGS Hepatobiliary: The liver appears stable as imaged in the noncontrast state. Again demonstrated is diffuse pneumobilia with gas in the gallbladder lumen. There is mild gallbladder wall thickening and surrounding inflammatory change. 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 Pancreas: Atrophy. No pancreatic ductal dilatation or focal surrounding inflammatory change. Spleen: Normal in size without focal abnormality. Adrenals/Urinary Tract: Stable mild thickening of the adrenal glands without suspicious findings. Mild bilateral renal cortical thinning with stable exophytic renal lesions bilaterally. Of note, suspected solid lesion in the lower  interpolar region of the right kidney is not well characterized on the current study due to lack of contrast. No hydronephrosis. The bladder appears unremarkable for its degree of distention. Stomach/Bowel: The stomach is now markedly distended with fluid. In  addition, there is at least moderate proximal to mid small bowel dilatation with scattered air-fluid levels consistent with a distal small bowel obstruction. There appear to be a few loops of distal decompressed small bowel, although the specific transition point is difficult to identify. Based on the prior study and clinical likelihood of gallstone ileus, there may be an obstructing gallstone in the distal small bowel (axial image 65/2 and coronal image 74/5), although this is not definitive. The colon is decompressed. Vascular/Lymphatic: There are no enlarged abdominal or pelvic lymph nodes. Aortic and branch vessel atherosclerosis. Reproductive: The uterus and ovaries appear normal. No adnexal mass. Other: Mild central mesenteric edema without focal extraluminal fluid collection. No free intraperitoneal air. No evidence of abdominal wall hernia. Musculoskeletal: No acute or significant osseous findings. Multilevel lumbar spondylosis. Mild edema throughout the subcutaneous fat. IMPRESSION: 1. Findings are consistent with a high-grade distal small bowel obstruction, likely from an obstructing gallstone in the distal small bowel (gallstone ileus). 2. The stomach is now markedly distended with fluid, and the patient may benefit from nasogastric tube decompression. No evidence of bowel perforation or abscess. 3. 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 4. Mildly progressed bibasilar atelectasis. Trace dependent left pleural effusion. 5. Aortic Atherosclerosis (ICD10-I70.0). Electronically Signed   By: Carey Bullocks M.D.   On: 08/20/2019 13:56   DG Chest Port 1 View  Result  Date: 08/20/2019 CLINICAL DATA:  79 year old female with history of hypoxia. EXAM: PORTABLE CHEST 1 VIEW COMPARISON:  Chest x-ray 07/04/2019. FINDINGS: Lung volumes are low. Opacity at the left base which may reflect atelectasis and/or consolidation, possibly with small left pleural effusion. Right lung is clear. No definite right pleural effusion. No pneumothorax. No definite suspicious appearing pulmonary nodules or masses are noted. No evidence of pulmonary edema. Mild cardiomegaly. Upper mediastinal contours are distorted by patient positioning. Aortic atherosclerosis. Status post TAVR. Left-sided pacemaker device in place with lead tips projecting over the expected location of the right atrium and right ventricle. IMPRESSION: 1. Low lung volumes with atelectasis and/or consolidation in the left lower lobe with probable small left pleural effusion. 2. Mild cardiomegaly. 3. Aortic atherosclerosis. 4. Postprocedural changes and support apparatus, as above. Electronically Signed   By: Trudie Reed M.D.   On: 08/20/2019 10:33    Procedures Procedures (including critical care time)  CRITICAL CARE Performed by: Vanetta Mulders Total critical care time: 45 minutes Critical care time was exclusive of separately billable procedures and treating other patients. Critical care was necessary to treat or prevent imminent or life-threatening deterioration. Critical care was time spent personally by me on the following activities: development of treatment plan with patient and/or surrogate as well as nursing, discussions with consultants, evaluation of patient's response to treatment, examination of patient, obtaining history from patient or surrogate, ordering and performing treatments and interventions, ordering and review of laboratory studies, ordering and review of radiographic studies, pulse oximetry and re-evaluation of patient's condition.   Medications Ordered in ED Medications  0.9 %  sodium  chloride infusion ( Intravenous New Bag/Given 08/20/19 1127)  vancomycin (VANCOREADY) IVPB 1750 mg/350 mL (has no administration in time range)  ceFEPIme (MAXIPIME) 2 g in sodium chloride 0.9 % 100 mL IVPB (has no administration in time range)  ceFEPIme (MAXIPIME) 2 g in sodium chloride 0.9 % 100 mL IVPB (0 g Intravenous Stopped 08/20/19 1342)  metroNIDAZOLE (FLAGYL) IVPB 500 mg (0 mg Intravenous Stopped 08/20/19 1332)  sodium  chloride 0.9 % bolus 500 mL (0 mLs Intravenous Stopped 08/20/19 1332)  vancomycin (VANCOREADY) IVPB 2000 mg/400 mL (0 mg Intravenous Stopped 08/20/19 1332)  acetaminophen (TYLENOL) tablet 650 mg (650 mg Oral Given 08/20/19 1629)    ED Course  I have reviewed the triage vital signs and the nursing notes.  Pertinent labs & imaging results that were available during my care of the patient were reviewed by me and considered in my medical decision making (see chart for details).    MDM Rules/Calculators/A&P                     Patient main complaint of shortness of breath.  Definitely has some hypoxia.  Requiring oxygen she does not have at home.  Patient also based on blood gas showing some hypercapnia.  So patient will be started on BiPAP because of this.  Patient also in acute kidney injury.  This is a change.  CTs had to be done without contrast because of the acute kidney injury.  CT abdomen raise concerns for gallstone obstruction in the distal small bowel.  This was discussed with Dr. Lovell Sheehan.  Who will consult and follow. Patient clinically not acting as if she has a bowel obstruction.  She talks about some vomiting over the last few days.  But has not had any persistent vomiting here today.  NG tube not required discussed with Dr. Lovell Sheehan.  Very possible some of this could be chronic.   Patient will be admitted by hospitalist service for the acute kidney injury the respiratory failure.  Final Clinical Impression(s) / ED Diagnoses Final diagnoses:  Hypoxia   Gallstone ileus (HCC)  AKI (acute kidney injury) (HCC)  Hypercapnia    Rx / DC Orders ED Discharge Orders    None       Vanetta Mulders, MD 08/25/19 [phone]

## 2019-08-20 NOTE — Progress Notes (Signed)
Pharmacy Antibiotic Note  Dawn Gonzales is a 79 y.o. female admitted on 08/20/2019 with unknown source.  Pharmacy has been consulted for Vancomycin and cefepime dosing.  Plan: Vancomycin 2000mg  loading dose, then 1750mg  IV every 48 hours.  Goal trough 15-20 mcg/mL.  Cefepime 2gm IV q24h F/U cxs and clinical progress Monitor V/S, labs and levels as indicated  Weight: 125.6 kg (277 lb)  Temp (24hrs), Avg:98.3 F (36.8 C), Min:98.3 F (36.8 C), Max:98.3 F (36.8 C)  Recent Labs  Lab 08/15/19 1412 08/20/19 1046 08/20/19 1047 08/20/19 1248  WBC 8.2 16.7*  --   --   CREATININE 0.71 2.23*  --   --   LATICACIDVEN  --   --  1.5 1.3    Estimated Creatinine Clearance: 27.7 mL/min (A) (by C-G formula based on SCr of 2.23 mg/dL (H)).   Normalized CrCl 80mls/min  No Known Allergies  Antimicrobials this admission: Vancomycin 4/21 >>  Cefepime 4/21 >>   Dose adjustments this admission: prn  Microbiology results: 4/21 5/21 4/21 UCx: pending  MRSA PCR:   Thank you for allowing pharmacy to be a part of this patient's care.  ONG:[email or web address], BS 5/21, Elder Cyphers Clinical Pharmacist Pager [phone]  08/20/2019 3:38 PM

## 2019-08-20 NOTE — Progress Notes (Signed)
ANTICOAGULATION CONSULT NOTE - Initial Consult  Pharmacy Consult for Lovenox  Indication: pulmonary embolus  No Known Allergies  Patient Measurements: Height: 5\' 6"  (167.6 cm) Weight: 124.2 kg (273 lb 13 oz) IBW/kg (Calculated) : 59.3  Vital Signs: Temp: 98.1 F (36.7 C) (04/21 1959) Temp Source: Oral (04/21 1959) BP: 149/94 (04/21 1959) Pulse Rate: 113 (04/21 1959)  Labs: Recent Labs    08/20/19 1046  HGB 15.5*  HCT 48.3*  PLT 341  APTT 31  LABPROT 14.1  INR 1.1  CREATININE 2.23*    Estimated Creatinine Clearance: 27.5 mL/min (A) (by C-G formula based on SCr of 2.23 mg/dL (H)).   Medical History: Past Medical History:  Diagnosis Date  . Anemia   . Anxiety   . Arthritis   . Bilateral renal cysts   . Cholecystitis   . Chronic diastolic heart failure (HCC)   . Depression   . Essential hypertension   . History of cellulitis   . History of CVA (cerebrovascular accident)   . History of endocarditis   . Hyperlipemia   . Morbid obesity (HCC)   . Neuropathy   . Right bundle branch block (RBBB)   . 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 Severe aortic stenosis   . Sleep apnea     Assessment: 79 y/o F with weakness/back pain. VQ scan with intermediate probability for PE. To begin Lovenox. PTA meds reviewed. Renal function will require frequency adjustment.   Goal of Therapy:  Monitor platelets by anticoagulation protocol: Yes   Plan:  Lovenox 120 mg subcutaneous q24h Daily CBC Monitor for bleeding F/U long term anti-coagulation plans  76, PharmD, BCPS Clinical Pharmacist Phone: [phone]

## 2019-08-20 NOTE — Progress Notes (Signed)
Patient has decided not to use a CPAP while in hospital at this time. She has never worn one or had to be placed on one. She is currently wearing 2lpm Parker with a saturation of 94% and HR 109. I informed patient that I would have a unit availble if her needs change. I also informed her nurse about what was decided by the patient.

## 2019-08-20 NOTE — ED Notes (Signed)
IV access obtained, antibiotics infusing.

## 2019-08-20 NOTE — H&P (Addendum)
History and Physical    Dawn Gonzales KPV:[id number] DOB: [birth year] DOA: 08/20/2019  PCP: Benita Stabile, MD   Patient coming from: Home  I have personally briefly reviewed patient's old medical records in [facility]  Chief Complaint: Weakness, back pain  HPI: Dawn Gonzales is a 79 y.o. female with medical history significant for complete heart block with pacemaker, diastolic CHF, morbid obesity, aortic stenosis status post TAVR, RBBB. EMS was called out for generalized weakness, and low back pain.  Patient was found to be hypoxic with O2 sats 80% on room air.  Patient is not on home oxygen.   Patient denies difficulty breathing, has mild chronic cough that is unchanged, no wheezing.  Reports some mild occasional sharp chest pains on the left side of her chest, but she has not had any chest pains today.  She is unaware if she has had any leg swelling or redness, as she has not looked at her legs.  Today she has had shoulder pains.  She has chronic arthritis back pain, that is unchanged from prior,  For which she takes Tylenol.  Denies falls.  She lives alone, but her daughter comes in to check on her.  She ambulates with a wheelchair. She tells me she has had multiple episodes of vomiting every day over the past few days, she has not had any vomiting today.  She also reports poor p.o. intake.  Last bowel movement was a few days ago, she is unsure of exactly when.  She denies abdominal pain. She denies urinary frequency or pain with urination.  She denies bloody stools vomiting of blood or black stools since discharge.  Recent hospitalization 3/5-3/11 for gallstone ileus, managed conservatively and which patient passed per rectum prior to discharge.  Patient also had an episode of bloody bowel movement in hospitalization which resolved prior to discharge.  Evaluated by GI thought to be rectal bleeding from anorectal injury and passing large stool with calcified rim.  ED  Course: Tachycardic to 115, temperature 98.3.  O2 sats on my evaluation ranging from 89% to 91% on 2 L O2.  Lactic acid 1.3.  UA- rare bacteria trace leukocytes.  ABG shows pH of 7.39, PCO2 of 59, PO2 of 66. Respiratory virus panel negative for Covid and influenza.  Leukocytosis of 16.2.  0.23, BUN 45. Chest CT-mildly progressed bibasilar atelectasis trace dependent left pleural effusion. Abdominal CT shows findings consistent with high-grade distal small bowel obstruction likely from an obstructing gallstone in the distal small bowel gallstone ileus. Initial concern for sepsis, code sepsis called, patient started on broad-spectrum antibiotics Vanco, cefepime and metronidazole. EDP talked to Dr. Lovell Sheehan, about CT findings, okay with her admission here, managed conservatively for now.  Review of Systems: As per HPI all other systems reviewed and negative.  Past Medical History:  Diagnosis Date  . Anemia   . Anxiety   . Arthritis   . Bilateral renal cysts   . Cholecystitis   . Chronic diastolic heart failure (HCC)   . Depression   . Essential hypertension   . History of cellulitis   . History of CVA (cerebrovascular accident)   . History of endocarditis   . Hyperlipemia   . Morbid obesity (HCC)   . Neuropathy   . Right bundle branch block (RBBB)   . 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 Severe aortic  stenosis   . Sleep apnea     Past Surgical History:  Procedure Laterality Date  . Abdominal gangrene    . ADENOIDECTOMY    . Cataract surgery    . COLONOSCOPY    . ENDOSCOPIC RETROGRADE CHOLANGIOPANCREATOGRAPHY (ERCP) WITH PROPOFOL N/A 02/08/2018   Procedure: ENDOSCOPIC RETROGRADE CHOLANGIOPANCREATOGRAPHY (ERCP) WITH PROPOFOL;  Surgeon: Meridee Score Netty Starring., MD;  Location: [facility] ENDOSCOPY;  Service: Gastroenterology;  Laterality: N/A;  . EUS  02/08/2018    Procedure: UPPER ENDOSCOPIC ULTRASOUND (EUS) LINEAR;  Surgeon: Lemar Lofty., MD;  Location: [facility] ENDOSCOPY;  Service: Gastroenterology;;  . EYE SURGERY    . IR FLUORO RM 30-60 MIN  03/27/2018  . IR PERC CHOLECYSTOSTOMY  02/09/2018  . IR RADIOLOGIST EVAL & MGMT  03/26/2018  . IR RADIOLOGY PERIPHERAL GUIDED IV START  05/22/2018  . IR RADIOLOGY PERIPHERAL GUIDED IV START  05/30/2018  . IR RADIOLOGY PERIPHERAL GUIDED IV START  06/05/2018  . IR US GUIDE VASC ACCESS RIGHT  05/22/2018  . IR US GUIDE VASC ACCESS RIGHT  05/30/2018  . IR US GUIDE VASC ACCESS RIGHT  06/05/2018  . PACEMAKER IMPLANT N/A 07/10/2018   Procedure: PACEMAKER IMPLANT;  Surgeon: Marinus Maw, MD;  Location: [facility] INVASIVE CV LAB;  Service: Cardiovascular;  Laterality: N/A;  . REMOVAL OF STONES  02/08/2018   Procedure: REMOVAL OF STONES;  Surgeon: Lemar Lofty., MD;  Location: [facility] ENDOSCOPY;  Service: Gastroenterology;;  . Dennison Mascot  02/08/2018   Procedure: Dennison Mascot;  Surgeon: Mansouraty, Netty Starring., MD;  Location: [facility] ENDOSCOPY;  Service: Gastroenterology;;  . TONSILLECTOMY    . TOOTH EXTRACTION N/A 06/18/2018   Procedure: DENTAL RESTORATION/EXTRACTIONS;  Surgeon: Ocie Doyne, DDS;  Location: [facility] OR;  Service: Oral Surgery;  Laterality: N/A;  . TRANSCATHETER AORTIC VALVE REPLACEMENT, TRANSFEMORAL N/A 07/09/2018   Procedure: TRANSCATHETER AORTIC VALVE REPLACEMENT, TRANSFEMORAL;  Surgeon: Tonny Bollman, MD;  Location: [facility] INVASIVE CV LAB;  Service: Cardiovascular;  Laterality: N/A;  . Tummy tuck       reports that she quit smoking about 41 years ago. Her smoking use included cigarettes. She has never used smokeless tobacco. She reports previous alcohol use. She reports that she does not use drugs.  No Known Allergies  Family History  Problem Relation Age of Onset  . Cancer Mother   . Hypertension Father   . Arthritis Father   . Heart attack Father     Prior to Admission medications   Medication  Sig Start Date End Date Taking? Authorizing Provider  acetaminophen (TYLENOL) 325 MG tablet Take 2 tablets (650 mg total) by mouth every 6 (six) hours as needed for mild pain, fever or headache (or Fever >/= 101). 07/10/19  Yes Shon Hale, MD  aspirin EC 81 MG tablet Take 1 tablet (81 mg total) by mouth daily with breakfast. 07/10/19  Yes Shawnta Schlegel, Courage, MD  Calcium Carbonate (CALCIUM-CARB 600 PO) Take 600 mg by mouth daily.   Yes [provider]  cholecalciferol (VITAMIN D) 1000 units tablet Take 1,000 Units by mouth daily.   Yes [provider]  ferrous sulfate 325 (65 FE) MG tablet Take 325 mg by mouth daily with breakfast.   Yes [provider]  gabapentin (NEURONTIN) 300 MG capsule Take 300 mg by mouth 3 (three) times daily.   Yes [provider]  metoCLOPramide (REGLAN) 10 MG tablet Take 1 tablet (10 mg total) by mouth every 6 (six) hours as needed for nausea (nausea/headache). 08/16/19  Yes Mesner, Barbara Cower, MD  Multiple Vitamin (MULTIVITAMIN WITH MINERALS) TABS tablet Take 1 tablet by mouth daily.   Yes [provider]  ondansetron (ZOFRAN) 4 MG tablet Take 4 mg by mouth every 8 (eight) hours as needed. 08/19/19  Yes [provider]  pantoprazole (PROTONIX) 40 MG tablet Take 1 tablet (40 mg total) by mouth daily. Patient taking differently: Take 40 mg by mouth 2 (two) times daily.  07/10/19 07/09/20 Yes Ahmiya Abee, Courage, MD  polyethylene glycol (MIRALAX MIX-IN PAX) 17 g packet Take 17 g by mouth daily as needed for mild constipation or moderate constipation. 07/10/19  Yes Diasha Castleman, Courage, MD  pravastatin (PRAVACHOL) 20 MG tablet Take 1 tablet (20 mg total) by mouth at bedtime. 07/10/19  Yes Claudie Brickhouse, Courage, MD  silver sulfADIAZINE (SILVADENE) 1 % cream Apply 1 application topically 2 (two) times daily. 05/30/19  Yes [provider]  traMADol (ULTRAM) 50 MG tablet Take 50 mg by mouth every 6 (six) hours as needed for moderate pain.    Yes [provider]  venlafaxine (EFFEXOR) 37.5 MG tablet Take 37.5 mg by mouth daily.   Yes [provider]  vitamin C (ASCORBIC ACID) 500 MG tablet Take 500 mg by mouth daily.   Yes [provider]  ondansetron (ZOFRAN ODT) 4 MG disintegrating tablet 4mg  ODT q4 hours prn nausea/vomit Patient not taking: Reported on 08/20/2019 08/13/19   08/15/19, Dartha Lodge    Physical Exam: Vitals:   08/20/19 1345 08/20/19 1400 08/20/19 1430 08/20/19 1500  BP:  (!) 158/93 133/88 128/76  Pulse: (!) 106 (!) 106 (!) 102 75  Resp: (!) 29     Temp:      TempSrc:      SpO2: 94% 93% 94% (!) 89%  Weight:        Constitutional: NAD, calm, comfortable Vitals:   08/20/19 1345 08/20/19 1400 08/20/19 1430 08/20/19 1500  BP:  (!) 158/93 133/88 128/76  Pulse: (!) 106 (!) 106 (!) 102 75  Resp: (!) 29     Temp:      TempSrc:      SpO2: 94% 93% 94% (!) 89%  Weight:       Eyes: PERRL, lids and conjunctivae normal ENMT: Mucous membranes are markedly dry.  Neck: normal, supple, no masses, no thyromegaly Respiratory: clear to auscultation bilaterally, no wheezing, no crackles. Normal respiratory effort. No accessory muscle use.  Cardiovascular: Mildly tachycardic, regular rate and rhythm, no murmurs / rubs / gallops. No extremity edema with chronic stasis changes bilateral lower half of legs, lower extremities warm and well perfused.   Abdomen: Obese abdomen, unable to tell if upper abdominal fullness is from adipose tissue or distention, abdomen soft, minimal tenderness right lower abdomen.  Musculoskeletal: Left shoulder pain, without tenderness or erythema, able to move left upper extremity without resistance or limitation, no clubbing / cyanosis. No joint deformity upper and lower extremities. Good ROM, no contractures. Normal muscle tone.  Skin: no rashes, lesions, ulcers. No induration Neurologic: No apparent cranial nerve abnormality, 4/5 strength upper and lower extremities.   Moving all extremities spontaneously.  Psychiatric: Normal judgment and insight. Alert and oriented x 3. Normal mood.   Labs on Admission: I have personally reviewed following labs and imaging studies  CBC: Recent Labs  Lab 08/15/19 1412 08/20/19 1046  WBC 8.2 16.7*  NEUTROABS  --  14.1*  HGB 12.7 15.5*  HCT 39.6 48.3*  MCV 93.2 93.8  PLT 310 341   Basic Metabolic Panel: Recent Labs  Lab  08/15/19 1412 08/20/19 1046  NA 140 137  K 4.4 3.8  CL 100 90*  CO2 30 31  GLUCOSE 110* 133*  BUN 16 45*  CREATININE 0.71 2.23*  CALCIUM 9.7 9.7   Liver Function Tests: Recent Labs  Lab 08/15/19 1412 08/20/19 1046  AST 17 34  ALT 11 22  ALKPHOS 68 75  BILITOT 0.8 1.1  PROT 6.8 7.6  ALBUMIN 3.5 3.7   Recent Labs  Lab 08/15/19 1412 08/20/19 1046  LIPASE 17 29   Coagulation Profile: Recent Labs  Lab 08/20/19 1046  INR 1.1    Urine analysis:    Component Value Date/Time   COLORURINE AMBER (A) 08/20/2019 1348   APPEARANCEUR CLOUDY (A) 08/20/2019 1348   LABSPEC 1.023 08/20/2019 1348   PHURINE 5.0 08/20/2019 1348   GLUCOSEU NEGATIVE 08/20/2019 1348   HGBUR SMALL (A) 08/20/2019 1348   BILIRUBINUR SMALL (A) 08/20/2019 [place] 08/20/2019 1348   PROTEINUR 30 (A) 08/20/2019 1348   NITRITE NEGATIVE 08/20/2019 1348   LEUKOCYTESUR TRACE (A) 08/20/2019 1348    Radiological Exams on Admission: CT Abdomen Pelvis Wo Contrast  Result Date: 08/20/2019 CLINICAL DATA:  Weakness with worsening low back pain. Awaiting gallbladder surgery for cholecystitis. Shortness of breath. EXAM: CT CHEST, ABDOMEN AND PELVIS WITHOUT CONTRAST TECHNIQUE: Multidetector CT imaging of the chest, abdomen and pelvis was performed following the standard protocol without IV contrast. COMPARISON:  Abdominopelvic CT 08/16/2019 and 08/12/2019. Chest CTA 05/30/2018. FINDINGS: CT CHEST FINDINGS Cardiovascular: Mild diffuse atherosclerosis of the aorta, great vessels and coronary arteries. No  acute vascular findings on noncontrast imaging. Left subclavian pacemaker leads extend into the right atrium and right ventricle. 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 Mitral annular calcifications are noted. No significant pericardial fluid. Mediastinum/Nodes: There are no enlarged mediastinal, hilar or axillary lymph nodes. The thyroid gland, trachea and esophagus demonstrate no significant findings. Lungs/Pleura: Trace dependent left pleural effusion. No pneumothorax. Atelectasis at both lung bases has mildly progressed from the recent abdominal CT. No confluent airspace opacity or significant pulmonary nodularity. Musculoskeletal/Chest wall: No chest wall mass or acute osseous findings. Multilevel thoracic spondylosis. Advanced glenohumeral arthropathy bilaterally. CT ABDOMEN AND PELVIS FINDINGS Hepatobiliary: The liver appears stable as imaged in the noncontrast state. Again demonstrated is diffuse pneumobilia with gas in the gallbladder lumen. There is mild gallbladder wall thickening and surrounding inflammatory change. 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 Pancreas: Atrophy. No pancreatic ductal dilatation or focal surrounding inflammatory change. Spleen: Normal in size without focal abnormality. Adrenals/Urinary Tract: Stable mild thickening of the adrenal glands without suspicious findings. Mild bilateral renal cortical thinning with stable exophytic renal lesions bilaterally. Of note, suspected solid lesion in the lower interpolar region of the right kidney is not well characterized on the current study due to lack of contrast. No hydronephrosis. The bladder appears unremarkable for its degree of distention. Stomach/Bowel: The stomach is now markedly distended with fluid. In addition, there is at least moderate proximal to mid small bowel dilatation with scattered air-fluid levels consistent with a distal small bowel  obstruction. There appear to be a few loops of distal decompressed small bowel, although the specific transition point is difficult to identify. Based on the prior study and clinical likelihood of gallstone ileus, there may be an obstructing gallstone in the distal small bowel (axial image 65/2 and coronal image 74/5), although this is not definitive. The colon is decompressed. Vascular/Lymphatic: There are no enlarged  abdominal or pelvic lymph nodes. Aortic and branch vessel atherosclerosis. Reproductive: The uterus and ovaries appear normal. No adnexal mass. Other: Mild central mesenteric edema without focal extraluminal fluid collection. No free intraperitoneal air. No evidence of abdominal wall hernia. Musculoskeletal: No acute or significant osseous findings. Multilevel lumbar spondylosis. Mild edema throughout the subcutaneous fat. IMPRESSION: 1. Findings are consistent with a high-grade distal small bowel obstruction, likely from an obstructing gallstone in the distal small bowel (gallstone ileus). 2. The stomach is now markedly distended with fluid, and the patient may benefit from nasogastric tube decompression. No evidence of bowel perforation or abscess. 3. 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 4. Mildly progressed bibasilar atelectasis. Trace dependent left pleural effusion. 5. Aortic Atherosclerosis (ICD10-I70.0). Electronically Signed   By: Carey Bullocks M.D.   On: 08/20/2019 13:56   CT Chest Wo Contrast  Result Date: 08/20/2019 CLINICAL DATA:  Weakness with worsening low back pain. Awaiting gallbladder surgery for cholecystitis. Shortness of breath. EXAM: CT CHEST, ABDOMEN AND PELVIS WITHOUT CONTRAST TECHNIQUE: Multidetector CT imaging of the chest, abdomen and pelvis was performed following the standard protocol without IV contrast. COMPARISON:  Abdominopelvic CT 08/16/2019 and 08/12/2019. Chest CTA 05/30/2018.  FINDINGS: CT CHEST FINDINGS Cardiovascular: Mild diffuse atherosclerosis of the aorta, great vessels and coronary arteries. No acute vascular findings on noncontrast imaging. Left subclavian pacemaker leads extend into the right atrium and right ventricle. 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 Mitral annular calcifications are noted. No significant pericardial fluid. Mediastinum/Nodes: There are no enlarged mediastinal, hilar or axillary lymph nodes. The thyroid gland, trachea and esophagus demonstrate no significant findings. Lungs/Pleura: Trace dependent left pleural effusion. No pneumothorax. Atelectasis at both lung bases has mildly progressed from the recent abdominal CT. No confluent airspace opacity or significant pulmonary nodularity. Musculoskeletal/Chest wall: No chest wall mass or acute osseous findings. Multilevel thoracic spondylosis. Advanced glenohumeral arthropathy bilaterally. CT ABDOMEN AND PELVIS FINDINGS Hepatobiliary: The liver appears stable as imaged in the noncontrast state. Again demonstrated is diffuse pneumobilia with gas in the gallbladder lumen. There is mild gallbladder wall thickening and surrounding inflammatory change. 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 Pancreas: Atrophy. No pancreatic ductal dilatation or focal surrounding inflammatory change. Spleen: Normal in size without focal abnormality. Adrenals/Urinary Tract: Stable mild thickening of the adrenal glands without suspicious findings. Mild bilateral renal cortical thinning with stable exophytic renal lesions bilaterally. Of note, suspected solid lesion in the lower interpolar region of the right kidney is not well characterized on the current study due to lack of contrast. No hydronephrosis. The bladder appears unremarkable for its degree of distention. Stomach/Bowel: The stomach is now markedly distended with fluid. In addition, there is  at least moderate proximal to mid small bowel dilatation with scattered air-fluid levels consistent with a distal small bowel obstruction. There appear to be a few loops of distal decompressed small bowel, although the specific transition point is difficult to identify. Based on the prior study and clinical likelihood of gallstone ileus, there may be an obstructing gallstone in the distal small bowel (axial image 65/2 and coronal image 74/5), although this is not definitive. The colon is decompressed. Vascular/Lymphatic: There are no enlarged abdominal or pelvic lymph nodes. Aortic and branch vessel atherosclerosis. Reproductive: The uterus and ovaries appear normal. No adnexal mass. Other: Mild central mesenteric edema without focal extraluminal fluid collection. No free intraperitoneal air. No evidence of abdominal  wall hernia. Musculoskeletal: No acute or significant osseous findings. Multilevel lumbar spondylosis. Mild edema throughout the subcutaneous fat. IMPRESSION: 1. Findings are consistent with a high-grade distal small bowel obstruction, likely from an obstructing gallstone in the distal small bowel (gallstone ileus). 2. The stomach is now markedly distended with fluid, and the patient may benefit from nasogastric tube decompression. No evidence of bowel perforation or abscess. 3. 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 4. Mildly progressed bibasilar atelectasis. Trace dependent left pleural effusion. 5. Aortic Atherosclerosis (ICD10-I70.0). Electronically Signed   By: Carey BullocksWilliam  Veazey M.D.   On: 08/20/2019 13:56   DG Chest Port 1 View  Result Date: 08/20/2019 CLINICAL DATA:  79 year old female with history of hypoxia. EXAM: PORTABLE CHEST 1 VIEW COMPARISON:  Chest x-ray 07/04/2019. FINDINGS: Lung volumes are low. Opacity at the left base which may reflect atelectasis and/or consolidation, possibly with small left  pleural effusion. Right lung is clear. No definite right pleural effusion. No pneumothorax. No definite suspicious appearing pulmonary nodules or masses are noted. No evidence of pulmonary edema. Mild cardiomegaly. Upper mediastinal contours are distorted by patient positioning. Aortic atherosclerosis. Status post TAVR. Left-sided pacemaker device in place with lead tips projecting over the expected location of the right atrium and right ventricle. IMPRESSION: 1. Low lung volumes with atelectasis and/or consolidation in the left lower lobe with probable small left pleural effusion. 2. Mild cardiomegaly. 3. Aortic atherosclerosis. 4. Postprocedural changes and support apparatus, as above. Electronically Signed   By: Trudie Reedaniel  Entrikin M.D.   On: 08/20/2019 10:33    EKG: Independently reviewed.  Sinus rhythm, old right bundle branch block.  No significant ST or T wave changes compared to prior.  QTc prolonged 595.  Assessment/Plan Principal Problem:   Hypoxia Active Problems:   Obesity, Class III, BMI 40-49.9 (morbid obesity) (HCC)   Sleep apnea   Hypertension   Chronic diastolic CHF (congestive heart failure), NYHA class 2 (HCC)   Gallstone ileus (HCC)   Complete heart block (HCC)   History of cardiac pacemaker   Acute hypoxic respiratory failure- Per EMS O2 sats 80% on room air, on my evaluation O2 sats 89 to 91% on 2 L O2.  No wheezing, unremarkable lung exam.  ABG shows pH of 7.39, PCO2 of 59, PO2 of 66.  Chest CT shows progressed bibasilar atelectasis trace dependent left pleural effusion.  Ambulates with a wheelchair, obese , not on anticoagulation, at risk for PE.  If rules out for PE, respiratory failure may be due to OHS, OSA and atelectasis. -Stat VQ scan -Incentive spirometry -Hold off on BiPAP for now, she is awake alert oriented, PCO2 not markedly elevated, pH is normal, also reports multiple episodes of vomiting, with abdominal CT showing markedly distended stomach. - addendum- Stat  VQ scan-read as intermediate.  As she is high risk for PE, persistently tachycardic, I will start anticoagulation with Lovenox, pending clinical course or till further definitive imaging can be done.  Acute kidney injury- creatinine elevated 2.23, baseline 0.6-0.7.  Likely prerenal, from vomiting and poor p.o. intake.  - 500mls N/s bolus given, continue N/s + 20 KCL 100 cc/hr x 1 day - BMP a.m  Gallstone ileus- recent hospitalization for same.  Reports multiple episodes of vomiting, abdominal CT shows markedly distended stomach with fluid, NG tube decompression recommended.,  High-grade distal small bowel obstruction likely from an obstructing gallstone in the distal small bowel. -General surgery consulted in ED -Will  insert NG tube - NPO   Prolonged QTC-markedly prolonged at 595.  Prior recent EKGs show QTC at most 495.  Potassium 3.8.  Likely due to new medications include Reglan, Zofran. Patient also on venlafaxine. -Check magnesium -EKG a.m. -Hold Reglan Zofran venlafaxine for now, also NPO status  SIRs- tachycardic(likely from hypoxia), with leukocytosis of 16.2.  Normal lactic acid 1.3.  Denies urinary symptoms, UA with trace leukocytes and rare bacteria, sample appears dirty -Spectrum antibiotic started in ED, hold off on further antibiotics for now. -Follow-up blood and urine cultures obtained in the ED -CBC a.m.  Pacemaker, TAVR status  Chronic diastolic CHF- stable and compensated.  Not on diuretics.  Last echo 06/2019 shows EF of 60 to 65%, indeterminate LV diastolic parameters.  -Hydrate. - Hold aspirin, statins, while NPO  OSA, morbid obesity-likely contributing to hypoxia. -CPAP nightly.  DVT prophylaxis: Lovenox Code Status: Full code Family Communication: None at bedside Disposition Plan: ~ 2 days, pending improvement in renal function, and work-up for respiratory failure. Consults called: General surgery Admission status: Inpt, tele I certify that at the point of  admission it is my clinical judgment that the patient will require inpatient hospital care spanning beyond 2 midnights from the point of admission due to high intensity of service, high risk for further deterioration and high frequency of surveillance required. The following factors support the patient status of inpatient: Respiratory failure, with acute kidney injury and gallstone ileus, multiple problems requiring close monitoring and management while hospitalized.   Onnie Boer MD [facility]  08/20/2019, 10:35 PM

## 2019-08-20 NOTE — ED Notes (Signed)
Code sepsis delayed by inability to obtain IV access. Pt has been stuck by this nurse and Fransico Him, RN. Victorino Dike, charge, notified.

## 2019-08-20 NOTE — ED Triage Notes (Signed)
Pt brought in by [facility] with c/o weakness and worsening lower back pain that radiates up to the left shoulder. Pt is currently waiting for gallbladder surgery until she can regain her strength. EMS reports pt's breathing was shallow and her O2 sat was 80% on RA when they arrived. Pt denies using home O2. Pt was placed on 2L O2 via [place] by EMS and O2 sat increased to 91%. CBG 180.

## 2019-08-21 ENCOUNTER — Inpatient Hospital Stay (HOSPITAL_COMMUNITY): Payer: Medicare PPO

## 2019-08-21 DIAGNOSIS — K56609 Unspecified intestinal obstruction, unspecified as to partial versus complete obstruction: Secondary | ICD-10-CM

## 2019-08-21 LAB — COMPREHENSIVE METABOLIC PANEL
ALT: 20 U/L (ref 0–44)
AST: 24 U/L (ref 15–41)
Albumin: 2.8 g/dL — ABNORMAL LOW (ref 3.5–5.0)
Alkaline Phosphatase: 62 U/L (ref 38–126)
Anion gap: 13 (ref 5–15)
BUN: 62 mg/dL — ABNORMAL HIGH (ref 8–23)
CO2: 29 mmol/L (ref 22–32)
Calcium: 8.5 mg/dL — ABNORMAL LOW (ref 8.9–10.3)
Chloride: 96 mmol/L — ABNORMAL LOW (ref 98–111)
Creatinine, Ser: 2.85 mg/dL — ABNORMAL HIGH (ref 0.44–1.00)
GFR calc Af Amer: 17 mL/min — ABNORMAL LOW (ref >60)
GFR calc non Af Amer: 15 mL/min — ABNORMAL LOW (ref >60)
Glucose, Bld: 124 mg/dL — ABNORMAL HIGH (ref 70–99)
Potassium: 3.9 mmol/L (ref 3.5–5.1)
Sodium: 138 mmol/L (ref 135–145)
Total Bilirubin: 0.8 mg/dL (ref 0.3–1.2)
Total Protein: 5.9 g/dL — ABNORMAL LOW (ref 6.5–8.1)

## 2019-08-21 LAB — CBC
HCT: 43.5 % (ref 36.0–46.0)
Hemoglobin: 13.5 g/dL (ref 12.0–15.0)
MCH: 29.7 pg (ref 26.0–34.0)
MCHC: 31 g/dL (ref 30.0–36.0)
MCV: 95.8 fL (ref 80.0–100.0)
Platelets: 285 10*3/uL (ref 150–400)
RBC: 4.54 MIL/uL (ref 3.87–5.11)
RDW: 15.1 % (ref 11.5–15.5)
WBC: 15.2 10*3/uL — ABNORMAL HIGH (ref 4.0–10.5)
nRBC: 0 % (ref 0.0–0.2)

## 2019-08-21 MED ORDER — LACTATED RINGERS IV SOLN: INTRAVENOUS | Status: DC

## 2019-08-21 MED ORDER — LACTATED RINGERS IV BOLUS
1000.0000 mL | Freq: Once | INTRAVENOUS | Status: AC
  Administered 2019-08-21: 1000 mL via INTRAVENOUS

## 2019-08-21 NOTE — Progress Notes (Addendum)
16 fr NG tube inserted L nare, 2 RNs at bedside, along with nursing students. Imaging ordered to check correct placement.

## 2019-08-21 NOTE — Progress Notes (Signed)
With Carollee Herter RN at bedside and Destiny RN and this writer Claris Gladden "Shayanna Thatch" Proofreader with [employer] present, RN student Melissa Ragland inserted 16 fr NG tube in pt's L nare. Correct measuring procedure followed. Pt at 90 degree angle.  Pt assisted by drinking water to promote the descent of the tube and did not appear to have any distress or verbalize any distress afterwards.  Pt was calm and cooperative.  Placement check done by radiography.

## 2019-08-21 NOTE — Progress Notes (Signed)
Asked patient about using CPAP.  Patient refused CPAP and stated that she does not use one at home.  Patient did not appear to be in any distress but does however currently have an NG tube in place due to GI tract issues.  Patient is still on [place] 2L and seems to be tolerating well.  Will continue to monitor.

## 2019-08-21 NOTE — Plan of Care (Signed)

## 2019-08-21 NOTE — Progress Notes (Signed)
Patient has coughed up blood, one hour post NG tube insertion. Suspecting trauma from insertion, as students met some resistance,and french was larger than prior tube. MD notified, new orders placed.

## 2019-08-21 NOTE — Consult Note (Signed)
Reason for Consult: Small bowel obstruction, possible gallstone ileus Referring Physician: Dr. Ola Spurr is an 79 y.o. female.  HPI: Patient is a 79 year old white female with multiple medical problems who I last saw during her admission for a gallstone ileus as well as a choledocho duodenal fistula.  She has a complex history with multiple surgeries to treat her hepatobiliary issues.  She has multiple medical problems which preclude any surgery to be performed in the hepatobiliary region.  She recently presented back to the emergency room with hypoxia and possible sepsis.  She was noted on CT scan of the abdomen to have multiple dilated loops of small bowel.  No transition zone was seen and no specific gallstone was seen in the GI tract.  During her previous admission, she did pass a gallstone and did not require any surgery.  She currently states she does not have any significant abdominal pain.  An NG tube is in place.  She states she passed gas after being admitted to the floor.  Past Medical History:  Diagnosis Date  . Anemia   . Anxiety   . Arthritis   . Bilateral renal cysts   . Cholecystitis   . Chronic diastolic heart failure (HCC)   . Depression   . Essential hypertension   . History of cellulitis   . History of CVA (cerebrovascular accident)   . History of endocarditis   . Hyperlipemia   . Morbid obesity (HCC)   . Neuropathy   . Right bundle branch block (RBBB)   . 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 Severe aortic stenosis   . Sleep apnea     Past Surgical History:  Procedure Laterality Date  . Abdominal gangrene    . ADENOIDECTOMY    . Cataract surgery    . COLONOSCOPY    . ENDOSCOPIC RETROGRADE CHOLANGIOPANCREATOGRAPHY (ERCP) WITH PROPOFOL N/A 02/08/2018   Procedure: ENDOSCOPIC RETROGRADE CHOLANGIOPANCREATOGRAPHY (ERCP)  WITH PROPOFOL;  Surgeon: Meridee Score Netty Starring., MD;  Location: [facility] ENDOSCOPY;  Service: Gastroenterology;  Laterality: N/A;  . EUS  02/08/2018   Procedure: UPPER ENDOSCOPIC ULTRASOUND (EUS) LINEAR;  Surgeon: Lemar Lofty., MD;  Location: [facility] ENDOSCOPY;  Service: Gastroenterology;;  . EYE SURGERY    . IR FLUORO RM 30-60 MIN  03/27/2018  . IR PERC CHOLECYSTOSTOMY  02/09/2018  . IR RADIOLOGIST EVAL & MGMT  03/26/2018  . IR RADIOLOGY PERIPHERAL GUIDED IV START  05/22/2018  . IR RADIOLOGY PERIPHERAL GUIDED IV START  05/30/2018  . IR RADIOLOGY PERIPHERAL GUIDED IV START  06/05/2018  . IR US GUIDE VASC ACCESS RIGHT  05/22/2018  . IR US GUIDE VASC ACCESS RIGHT  05/30/2018  . IR US GUIDE VASC ACCESS RIGHT  06/05/2018  . PACEMAKER IMPLANT N/A 07/10/2018   Procedure: PACEMAKER IMPLANT;  Surgeon: Marinus Maw, MD;  Location: [facility] INVASIVE CV LAB;  Service: Cardiovascular;  Laterality: N/A;  . REMOVAL OF STONES  02/08/2018   Procedure: REMOVAL OF STONES;  Surgeon: Lemar Lofty., MD;  Location: [facility] ENDOSCOPY;  Service: Gastroenterology;;  . Dennison Mascot  02/08/2018   Procedure: Dennison Mascot;  Surgeon: Mansouraty, Netty Starring., MD;  Location: [facility] ENDOSCOPY;  Service: Gastroenterology;;  . TONSILLECTOMY    . TOOTH EXTRACTION N/A 06/18/2018   Procedure: DENTAL RESTORATION/EXTRACTIONS;  Surgeon: Ocie Doyne, DDS;  Location: [facility] OR;  Service: Oral Surgery;  Laterality: N/A;  . TRANSCATHETER  AORTIC VALVE REPLACEMENT, TRANSFEMORAL N/A 07/09/2018   Procedure: TRANSCATHETER AORTIC VALVE REPLACEMENT, TRANSFEMORAL;  Surgeon: Tonny Bollman, MD;  Location: [facility] INVASIVE CV LAB;  Service: Cardiovascular;  Laterality: N/A;  . Tummy tuck      Family History  Problem Relation Age of [place]  . Cancer Mother   . Hypertension Father   . Arthritis Father   . Heart attack Father     Social History:  reports that she quit smoking about 41 years ago. Her smoking use included cigarettes. She has never used  smokeless tobacco. She reports previous alcohol use. She reports that she does not use drugs.  Allergies: No Known Allergies  Medications:  Scheduled: . enoxaparin (LOVENOX) injection  120 mg Subcutaneous Q24H  . pantoprazole (PROTONIX) IV  40 mg Intravenous Q24H    Results for orders placed or performed during the hospital encounter of 08/20/19 (from the past 48 hour(s))  Blood gas, arterial ([facility], [facility], [facility])     Status: Abnormal   Collection Time: 08/20/19  9:54 AM  Result Value Ref Range   FIO2 28.00    pH, Arterial 7.395 7.350 - 7.450   pCO2 arterial 59.2 (H) 32.0 - 48.0 mmHg   pO2, Arterial 66.1 (L) 83.0 - 108.0 mmHg   Bicarbonate 32.1 (H) 20.0 - 28.0 mmol/L   Acid-Base Excess 10.4 (H) 0.0 - 2.0 mmol/L   O2 Saturation 90.6 %   Patient temperature 37.0    Allens test (pass/fail) PASS PASS    Comment: Performed at [facility], [address], [place], [place] [address]  Respiratory Panel by RT PCR (Flu A&B, Covid) - Nasopharyngeal Swab     Status: None   Collection Time: 08/20/19  9:56 AM   Specimen: Nasopharyngeal Swab  Result Value Ref Range   SARS Coronavirus 2 by RT PCR NEGATIVE NEGATIVE    Comment: (NOTE) SARS-CoV-2 target nucleic acids are NOT DETECTED. The SARS-CoV-2 RNA is generally detectable in upper respiratoy specimens during the acute phase of infection. The lowest concentration of SARS-CoV-2 viral copies this assay can detect is 131 copies/mL. A negative result does not preclude SARS-Cov-2 infection and should not be used as the sole basis for treatment or other patient management decisions. A negative result may occur with  improper specimen collection/handling, submission of specimen other than nasopharyngeal swab, presence of viral mutation(s) within the areas targeted by this assay, and inadequate number of viral copies (<131 copies/mL). A negative result must be combined with clinical observations, patient history, and epidemiological information.  The expected result is Negative. Fact Sheet for Patients:  https://www.moore.com/ Fact Sheet for Healthcare Providers:  https://www.young.biz/ This test is not yet ap proved or cleared by the [place] FDA and  has been authorized for detection and/or diagnosis of SARS-CoV-2 by FDA under an Emergency Use Authorization (EUA). This EUA will remain  in effect (meaning this test can be used) for the duration of the COVID-19 declaration under Section 564(b)(1) of the Act, 21 U.S.C. section 360bbb-3(b)(1), unless the authorization is terminated or revoked sooner.    Influenza A by PCR NEGATIVE NEGATIVE   Influenza B by PCR NEGATIVE NEGATIVE    Comment: (NOTE) The Xpert Xpress SARS-CoV-2/FLU/RSV assay is intended as an aid in  the diagnosis of influenza from Nasopharyngeal swab specimens and  should not be used as a sole basis for treatment. Nasal washings and  aspirates are unacceptable for Xpert Xpress SARS-CoV-2/FLU/RSV  testing. Fact Sheet for Patients: https://www.moore.com/ Fact Sheet for Healthcare Providers: https://www.young.biz/ This  test is not yet approved or cleared by the [place] and  has been authorized for detection and/or diagnosis of SARS-CoV-2 by  FDA under an Emergency Use Authorization (EUA). This EUA will remain  in effect (meaning this test can be used) for the duration of the  Covid-19 declaration under Section 564(b)(1) of the Act, 21  U.S.C. section 360bbb-3(b)(1), unless the authorization is  terminated or revoked. Performed at [facility], [address], [place], [place] [address]   Comprehensive metabolic panel     Status: Abnormal   Collection Time: 08/20/19 10:46 AM  Result Value Ref Range   Sodium 137 135 - 145 mmol/L   Potassium 3.8 3.5 - 5.1 mmol/L   Chloride 90 (L) 98 - 111 mmol/L   CO2 31 22 - 32 mmol/L   Glucose, Bld 133 (H) 70 - 99 mg/dL    Comment:  Glucose reference range applies only to samples taken after fasting for at least 8 hours.   BUN 45 (H) 8 - 23 mg/dL   Creatinine, Ser 7.51 (H) 0.44 - 1.00 mg/dL   Calcium 9.7 8.9 - 02.5 mg/dL   Total Protein 7.6 6.5 - 8.1 g/dL   Albumin 3.7 3.5 - 5.0 g/dL   AST 34 15 - 41 U/L   ALT 22 0 - 44 U/L   Alkaline Phosphatase 75 38 - 126 U/L   Total Bilirubin 1.1 0.3 - 1.2 mg/dL   GFR calc non Af Amer 20 (L) >60 mL/min   GFR calc Af Amer 24 (L) >60 mL/min   Anion gap 16 (H) 5 - 15    Comment: Performed at [facility], [address] [place], [place] [address]  CBC WITH DIFFERENTIAL     Status: Abnormal   Collection Time: 08/20/19 10:46 AM  Result Value Ref Range   WBC 16.7 (H) 4.0 - 10.5 K/uL   RBC 5.15 (H) 3.87 - 5.11 MIL/uL   Hemoglobin 15.5 (H) 12.0 - 15.0 g/dL   HCT 82.4 (H) 23.5 - 36.1 %   MCV 93.8 80.0 - 100.0 fL   MCH 30.1 26.0 - 34.0 pg   MCHC 32.1 30.0 - 36.0 g/dL   RDW 44.3 15.4 - 00.8 %   Platelets 341 150 - 400 K/uL   nRBC 0.0 0.0 - 0.2 %   Neutrophils Relative % 84 %   Neutro Abs 14.1 (H) 1.7 - 7.7 K/uL   Lymphocytes Relative 4 %   Lymphs Abs 0.6 (L) 0.7 - 4.0 K/uL   Monocytes Relative 11 %   Monocytes Absolute 1.8 (H) 0.1 - 1.0 K/uL   Eosinophils Relative 0 %   Eosinophils Absolute 0.0 0.0 - 0.5 K/uL   Basophils Relative 1 %   Basophils Absolute 0.1 0.0 - 0.1 K/uL   Immature Granulocytes 0 %   Abs Immature Granulocytes 0.06 0.00 - 0.07 K/uL    Comment: Performed at [facility], [address], [place], [place] [address]  APTT     Status: None   Collection Time: 08/20/19 10:46 AM  Result Value Ref Range   aPTT 31 24 - 36 seconds    Comment: Performed at [facility], [address] [place], [place] [address]  Protime-INR     Status: None   Collection Time: 08/20/19 10:46 AM  Result Value Ref Range   Prothrombin Time 14.1 11.4 - 15.2 seconds   INR 1.1 0.8 - 1.2    Comment: (NOTE) INR goal varies based on device and disease  states. Performed at [facility], [address] [place], [place] [address]   Lipase, blood     Status: None   Collection Time: 08/20/19 10:46 AM  Result Value Ref Range   Lipase 29 11 - 51 U/L    Comment: Performed at [facility], [address] [place], [place] [address]  Magnesium     Status: None   Collection Time: 08/20/19 10:46 AM  Result Value Ref Range   Magnesium 2.1 1.7 - 2.4 mg/dL    Comment: Performed at [facility], [address], [place], [place] [address]  Lactic acid, plasma     Status: None   Collection Time: 08/20/19 10:47 AM  Result Value Ref Range   Lactic Acid, Venous 1.5 0.5 - 1.9 mmol/L    Comment: Performed at [facility], [address], [place], [place] [address]  Blood Culture (routine x 2)     Status: None (Preliminary result)   Collection Time: 08/20/19 10:47 AM   Specimen: BLOOD LEFT HAND  Result Value Ref Range   Specimen Description BLOOD LEFT HAND DRAWN BY RN    Special Requests      BOTTLES DRAWN AEROBIC AND ANAEROBIC Blood Culture adequate volume   Culture      NO GROWTH < 24 HOURS Performed at [facility], [address], [place], [place] [address]    Report Status PENDING   Blood Culture (routine x 2)     Status: None (Preliminary result)   Collection Time: 08/20/19 10:48 AM   Specimen: BLOOD RIGHT HAND  Result Value Ref Range   Specimen Description BLOOD RIGHT HAND DRAWN BY RN    Special Requests      BOTTLES DRAWN AEROBIC AND ANAEROBIC Blood Culture results may not be optimal due to an inadequate volume of blood received in culture bottles   Culture      NO GROWTH < 24 HOURS Performed at [facility], [address], [place], [place] [address]    Report Status PENDING   Brain natriuretic peptide     Status: Abnormal   Collection Time: 08/20/19 10:48 AM  Result Value Ref Range   B Natriuretic Peptide 339.0 (H) 0.0 - 100.0 pg/mL    Comment: Performed at [facility], [address], [place], [place] [address]  Lactic acid, plasma     Status: None   Collection  Time: 08/20/19 12:48 PM  Result Value Ref Range   Lactic Acid, Venous 1.3 0.5 - 1.9 mmol/L    Comment: Performed at [facility], [address], [place], [place] [address]  Urinalysis, Routine w reflex microscopic     Status: Abnormal   Collection Time: 08/20/19  1:48 PM  Result Value Ref Range   Color, Urine AMBER (A) YELLOW    Comment: BIOCHEMICALS MAY BE AFFECTED BY COLOR   APPearance CLOUDY (A) CLEAR   Specific Gravity, Urine 1.023 1.005 - 1.030   pH 5.0 5.0 - 8.0   Glucose, UA NEGATIVE NEGATIVE mg/dL   Hgb urine dipstick SMALL (A) NEGATIVE   Bilirubin Urine SMALL (A) NEGATIVE   Ketones, ur NEGATIVE NEGATIVE mg/dL   Protein, ur 30 (A) NEGATIVE mg/dL   Nitrite NEGATIVE NEGATIVE   Leukocytes,Ua TRACE (A) NEGATIVE   RBC / HPF 6-10 0 - 5 RBC/hpf   WBC, UA 0-5 0 - 5 WBC/hpf   Bacteria, UA RARE (A) NONE SEEN   Squamous Epithelial / LPF 11-20 0 - 5   Mucus PRESENT     Comment:  Performed at [facility], [address], [place], [place] [address]  CBC     Status: Abnormal   Collection Time: 08/21/19  5:42 AM  Result Value Ref Range   WBC 15.2 (H) 4.0 - 10.5 K/uL   RBC 4.54 3.87 - 5.11 MIL/uL   Hemoglobin 13.5 12.0 - 15.0 g/dL   HCT 43.5 36.0 - 46.0 %   MCV 95.8 80.0 - 100.0 fL   MCH 29.7 26.0 - 34.0 pg   MCHC 31.0 30.0 - 36.0 g/dL   RDW 15.1 11.5 - 15.5 %   Platelets 285 150 - 400 K/uL   nRBC 0.0 0.0 - 0.2 %    Comment: Performed at [facility], [address], [place], [place] [address]  Comprehensive metabolic panel     Status: Abnormal   Collection Time: 08/21/19  5:42 AM  Result Value Ref Range   Sodium 138 135 - 145 mmol/L   Potassium 3.9 3.5 - 5.1 mmol/L   Chloride 96 (L) 98 - 111 mmol/L   CO2 29 22 - 32 mmol/L   Glucose, Bld 124 (H) 70 - 99 mg/dL    Comment: Glucose reference range applies only to samples taken after fasting for at least 8 hours.   BUN 62 (H) 8 - 23 mg/dL   Creatinine, Ser 2.85 (H) 0.44 - 1.00 mg/dL   Calcium 8.5 (L) 8.9 - 10.3 mg/dL   Total  Protein 5.9 (L) 6.5 - 8.1 g/dL   Albumin 2.8 (L) 3.5 - 5.0 g/dL   AST 24 15 - 41 U/L   ALT 20 0 - 44 U/L   Alkaline Phosphatase 62 38 - 126 U/L   Total Bilirubin 0.8 0.3 - 1.2 mg/dL   GFR calc non Af Amer 15 (L) >60 mL/min   GFR calc Af Amer 17 (L) >60 mL/min   Anion gap 13 5 - 15    Comment: Performed at [facility], [address] [place], [place] [address]    CT Abdomen Pelvis Wo Contrast  Result Date: 08/20/2019 CLINICAL DATA:  Weakness with worsening low back pain. Awaiting gallbladder surgery for cholecystitis. Shortness of breath. EXAM: CT CHEST, ABDOMEN AND PELVIS WITHOUT CONTRAST TECHNIQUE: Multidetector CT imaging of the chest, abdomen and pelvis was performed following the standard protocol without IV contrast. COMPARISON:  Abdominopelvic CT 08/16/2019 and 08/12/2019. Chest CTA 05/30/2018. FINDINGS: CT CHEST FINDINGS Cardiovascular: Mild diffuse atherosclerosis of the aorta, great vessels and coronary arteries. No acute vascular findings on noncontrast imaging. Left subclavian pacemaker leads extend into the right atrium and right ventricle. 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 Mitral annular calcifications are noted. No significant pericardial fluid. Mediastinum/Nodes: There are no enlarged mediastinal, hilar or axillary lymph nodes. The thyroid gland, trachea and esophagus demonstrate no significant findings. Lungs/Pleura: Trace dependent left pleural effusion. No pneumothorax. Atelectasis at both lung bases has mildly progressed from the recent abdominal CT. No confluent airspace opacity or significant pulmonary nodularity. Musculoskeletal/Chest wall: No chest wall mass or acute osseous findings. Multilevel thoracic spondylosis. Advanced glenohumeral arthropathy bilaterally. CT ABDOMEN AND PELVIS FINDINGS Hepatobiliary: The liver appears stable as imaged in the noncontrast state. Again demonstrated is diffuse pneumobilia with gas in the gallbladder lumen. There is mild gallbladder wall  thickening and surrounding inflammatory change. 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 Pancreas: Atrophy. No pancreatic ductal dilatation or focal surrounding inflammatory change. Spleen: Normal in size without focal abnormality. Adrenals/Urinary Tract: Stable mild thickening of the  adrenal glands without suspicious findings. Mild bilateral renal cortical thinning with stable exophytic renal lesions bilaterally. Of note, suspected solid lesion in the lower interpolar region of the right kidney is not well characterized on the current study due to lack of contrast. No hydronephrosis. The bladder appears unremarkable for its degree of distention. Stomach/Bowel: The stomach is now markedly distended with fluid. In addition, there is at least moderate proximal to mid small bowel dilatation with scattered air-fluid levels consistent with a distal small bowel obstruction. There appear to be a few loops of distal decompressed small bowel, although the specific transition point is difficult to identify. Based on the prior study and clinical likelihood of gallstone ileus, there may be an obstructing gallstone in the distal small bowel (axial image 65/2 and coronal image 74/5), although this is not definitive. The colon is decompressed. Vascular/Lymphatic: There are no enlarged abdominal or pelvic lymph nodes. Aortic and branch vessel atherosclerosis. Reproductive: The uterus and ovaries appear normal. No adnexal mass. Other: Mild central mesenteric edema without focal extraluminal fluid collection. No free intraperitoneal air. No evidence of abdominal wall hernia. Musculoskeletal: No acute or significant osseous findings. Multilevel lumbar spondylosis. Mild edema throughout the subcutaneous fat. IMPRESSION: 1. Findings are consistent with a high-grade distal small bowel obstruction, likely from an obstructing gallstone in the distal  small bowel (gallstone ileus). 2. The stomach is now markedly distended with fluid, and the patient may benefit from nasogastric tube decompression. No evidence of bowel perforation or abscess. 3. 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 4. Mildly progressed bibasilar atelectasis. Trace dependent left pleural effusion. 5. Aortic Atherosclerosis (ICD10-I70.0). Electronically Signed   By: Carey BullocksWilliam  Veazey M.D.   On: 08/20/2019 13:56   CT Chest Wo Contrast  Result Date: 08/20/2019 CLINICAL DATA:  Weakness with worsening low back pain. Awaiting gallbladder surgery for cholecystitis. Shortness of breath. EXAM: CT CHEST, ABDOMEN AND PELVIS WITHOUT CONTRAST TECHNIQUE: Multidetector CT imaging of the chest, abdomen and pelvis was performed following the standard protocol without IV contrast. COMPARISON:  Abdominopelvic CT 08/16/2019 and 08/12/2019. Chest CTA 05/30/2018. FINDINGS: CT CHEST FINDINGS Cardiovascular: Mild diffuse atherosclerosis of the aorta, great vessels and coronary arteries. No acute vascular findings on noncontrast imaging. Left subclavian pacemaker leads extend into the right atrium and right ventricle. 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 Mitral annular calcifications are noted. No significant pericardial fluid. Mediastinum/Nodes: There are no enlarged mediastinal, hilar or axillary lymph nodes. The thyroid gland, trachea and esophagus demonstrate no significant findings. Lungs/Pleura: Trace dependent left pleural effusion. No pneumothorax. Atelectasis at both lung bases has mildly progressed from the recent abdominal CT. No confluent airspace opacity or significant pulmonary nodularity. Musculoskeletal/Chest wall: No chest wall mass or acute osseous findings. Multilevel thoracic spondylosis. Advanced glenohumeral arthropathy bilaterally. CT ABDOMEN AND PELVIS FINDINGS Hepatobiliary: The liver appears stable as imaged in the  noncontrast state. Again demonstrated is diffuse pneumobilia with gas in the gallbladder lumen. There is mild gallbladder wall thickening and surrounding inflammatory change. 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 Pancreas: Atrophy. No pancreatic ductal dilatation or focal surrounding inflammatory change. Spleen: Normal in size without focal abnormality. Adrenals/Urinary Tract: Stable mild thickening of the adrenal glands without suspicious findings. Mild bilateral renal cortical thinning with stable exophytic renal lesions bilaterally. Of note, suspected solid lesion in the lower interpolar region of the right kidney is not well characterized on the current study  due to lack of contrast. No hydronephrosis. The bladder appears unremarkable for its degree of distention. Stomach/Bowel: The stomach is now markedly distended with fluid. In addition, there is at least moderate proximal to mid small bowel dilatation with scattered air-fluid levels consistent with a distal small bowel obstruction. There appear to be a few loops of distal decompressed small bowel, although the specific transition point is difficult to identify. Based on the prior study and clinical likelihood of gallstone ileus, there may be an obstructing gallstone in the distal small bowel (axial image 65/2 and coronal image 74/5), although this is not definitive. The colon is decompressed. Vascular/Lymphatic: There are no enlarged abdominal or pelvic lymph nodes. Aortic and branch vessel atherosclerosis. Reproductive: The uterus and ovaries appear normal. No adnexal mass. Other: Mild central mesenteric edema without focal extraluminal fluid collection. No free intraperitoneal air. No evidence of abdominal wall hernia. Musculoskeletal: No acute or significant osseous findings. Multilevel lumbar spondylosis. Mild edema throughout the subcutaneous fat. IMPRESSION:  1. Findings are consistent with a high-grade distal small bowel obstruction, likely from an obstructing gallstone in the distal small bowel (gallstone ileus). 2. The stomach is now markedly distended with fluid, and the patient may benefit from nasogastric tube decompression. No evidence of bowel perforation or abscess. 3. 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 4. Mildly progressed bibasilar atelectasis. Trace dependent left pleural effusion. 5. Aortic Atherosclerosis (ICD10-I70.0). Electronically Signed   By: Carey Bullocks M.D.   On: 08/20/2019 13:56   NM Pulmonary Perf and Vent  Result Date: 08/20/2019 CLINICAL DATA:  Respiratory failure with hypoxemia and acute kidney injury EXAM: NUCLEAR MEDICINE VENTILATION - PERFUSION LUNG SCAN TECHNIQUE: Ventilation images were obtained in multiple projections using inhaled aerosol Tc-32m DTPA. Perfusion images were obtained in multiple projections after intravenous injection of Tc-68m MAA. RADIOPHARMACEUTICALS:  40 mCi of Tc-89m DTPA aerosol inhalation and 1.5 mCi Tc26m MAA IV COMPARISON:  CT chest 08/20/2019, chest radiograph 08/20/2019 FINDINGS: Ventilation: Heterogenous with diffusely decreased ventilation to the left thorax. Smaller ventilation defects at the right lung base. Moderate defect at the left lower lobe. Perfusion: Diffusely decreased perfusion to the left lung. Moderate perfusion defect left lower lobe. IMPRESSION: 1. Study is limited by habitus. 2. Diffusely decreased perfusion and ventilation to the left thorax, suspect that some of this is related to soft tissue attenuation. Moderate matched lower lung zone defect on the left with radiographic opacity. Overall intermediate probability for PE. Electronically Signed   By: Jasmine Pang M.D.   On: 08/20/2019 18:58   DG CHEST PORT 1 VIEW  Result Date: 08/20/2019 CLINICAL DATA:  NG tube placement. EXAM: PORTABLE CHEST  1 VIEW COMPARISON:  Imaging earlier this day. FINDINGS: The enteric tube is visualized to the gastroesophageal junction, side port tentatively in the lower esophagus. Left-sided pacemaker in place. Cardiomegaly with unchanged mediastinal contours. TAVR. Small left pleural effusion with multifocal atelectasis. IMPRESSION: The enteric tube is visualized to the distal esophagus, tip likely at the gastroesophageal junction, side-port tentatively seen in the distal esophagus. Recommend advancement of 8-10 cm for optimal placement. Electronically Signed   By: Narda Rutherford M.D.   On: 08/20/2019 23:56   DG Chest Port 1 View  Result Date: 08/20/2019 CLINICAL DATA:  79 year old female with history of hypoxia. EXAM: PORTABLE CHEST 1 VIEW COMPARISON:  Chest x-ray 07/04/2019. FINDINGS: Lung volumes are low. Opacity at the left base which may reflect atelectasis and/or consolidation, possibly with small  left pleural effusion. Right lung is clear. No definite right pleural effusion. No pneumothorax. No definite suspicious appearing pulmonary nodules or masses are noted. No evidence of pulmonary edema. Mild cardiomegaly. Upper mediastinal contours are distorted by patient positioning. Aortic atherosclerosis. Status post TAVR. Left-sided pacemaker device in place with lead tips projecting over the expected location of the right atrium and right ventricle. IMPRESSION: 1. Low lung volumes with atelectasis and/or consolidation in the left lower lobe with probable small left pleural effusion. 2. Mild cardiomegaly. 3. Aortic atherosclerosis. 4. Postprocedural changes and support apparatus, as above. Electronically Signed   By: Trudie Reed M.D.   On: 08/20/2019 10:33    ROS:  Pertinent items are noted in HPI.  Blood pressure 126/74, pulse (!) 103, temperature 98.9 F (37.2 C), temperature source Oral, resp. rate 20, height  (1.676 m), weight 124.2 kg, SpO2 98 %. Physical Exam: Morbidly obese white female no acute  distress Head is normocephalic, atraumatic Eyes are without scleral icterus Lungs are clear to auscultation with good breath sounds bilaterally Heart examination reveals a regular rate and rhythm Abdomen is soft.  Occasional bowel sounds appreciated.  Difficult to fully examine due to body habitus.  CT scan images personally reviewed.  Labs reviewed.  Assessment/Plan: Impression: Patient with multiple medical problems and is difficult to certain whether the leukocytosis is secondary to her hepatobiliary issues or another source.  Liver enzyme tests are within normal limits.  Patient is at very high risk for any surgical intervention.  Hopefully, her bowel findings are secondary to another process and not gallstone ileus. Plan: Would continue NG tube decompression for now.  Will follow with you.  Franky Macho 08/21/2019, 8:11 AM

## 2019-08-21 NOTE — Progress Notes (Signed)
PROGRESS NOTE    Dawn Gonzales  RDE:[id number] DOB: 1940-10-06 DOA: 08/20/2019 PCP: [name], MD   Brief Narrative:  Per HPI: Dawn Gonzales is a 79 y.o. female with medical history significant for complete heart block with pacemaker, diastolic CHF, morbid obesity, aortic stenosis status post TAVR, RBBB. EMS was called out for generalized weakness, and low back pain.  Patient was found to be hypoxic with O2 sats 80% on room air.  Patient is not on home oxygen.   Patient denies difficulty breathing, has mild chronic cough that is unchanged, no wheezing.  Reports some mild occasional sharp chest pains on the left side of her chest, but she has not had any chest pains today.  She is unaware if she has had any leg swelling or redness, as she has not looked at her legs.  Today she has had shoulder pains.  She has chronic arthritis back pain, that is unchanged from prior,  For which she takes Tylenol.  Denies falls.  She lives alone, but her daughter comes in to check on her.  She ambulates with a wheelchair. She tells me she has had multiple episodes of vomiting every day over the past few days, she has not had any vomiting today.  She also reports poor p.o. intake.  Last bowel movement was a few days ago, she is unsure of exactly when.  She denies abdominal pain. She denies urinary frequency or pain with urination.  She denies bloody stools vomiting of blood or black stools since discharge.  4/22: Patient has been admitted with concern for gallstone ileus and has associated AKI as well as acute hypoxemic respiratory failure.  There was some question of possible PE and now she has an indeterminate VQ scan.  She remains on full dose Lovenox at the moment and has worsening AKI and will require greater amounts of IV fluid since she still has significant NG tube output noted.  LVEF noted to be 60 to 65% on 07/06/2019.  Continue to monitor labs.  General surgery evaluation with plans for  conservative management at this point using NG tube.  Assessment & Plan:   Principal Problem:   Hypoxia Active Problems:   Obesity, Class III, BMI 40-49.9 (morbid obesity) (HCC)   Sleep apnea   Hypertension   Chronic diastolic CHF (congestive heart failure), NYHA class 2 (HCC)   Gallstone ileus (HCC)   Complete heart block (HCC)   History of cardiac pacemaker   Small bowel obstruction (HCC)   Generalized weakness likely secondary to dehydration/AKI in the setting of gallstone ileus -Continue overall hydration and conservative management as noted below -Mostly wheelchair-bound at home  Ileus -Possibly related to gallstones, but this remains uncertain -Continue NG tube decompression -Monitor electrolytes -Appreciate general surgery evaluation  AKI -Appears to be worsening, but likely prerenal -Noted to have solid mass to right kidney on CT with renal ultrasound ordered and pending -Urine electrolytes pending -Aggressive IV fluid and monitor I's and O's -Avoid nephrotoxic agents and follow a.m. labs  Acute hypoxemic respiratory failure-multifactorial -Very questionable if this is actually a PE, I doubt this given the fact that she has had no significant chest pain, cough, or even shortness of breath -Continue full dose Lovenox for now, and hopefully proper study can be obtained prior to discharge -May very well be related more to OHS/OSA and atelectasis -Continue incentive spirometry and wean oxygen as tolerated  Prolonged QTC -595 MS on repeat EKG -Monitor electrolytes and repeat EKG  in a.m.   Chronic diastolic CHF with permanent pacemaker and TAVR -Last 2D echocardiogram 06/2019 with EF 60-65% -Okay for aggressive hydration -Monitor carefully  OSA/morbid obesity -Likely source of hypoxemia -CPAP nightly  DVT prophylaxis: Full dose Lovenox Code Status: Full code Family Communication: Discussed with daughter on phone, [name] Disposition Plan: Continue  conservative management with NG tube as well as aggressive IV fluid and evaluation of urine electrolytes and renal ultrasound pending.   Consultants:   None  Procedures:   None  Antimicrobials:   None   Subjective: Patient seen and evaluated today with no new acute complaints or concerns. No acute concerns or events noted overnight.  She has had high amounts of NG tube output with no bowel movements.  She denies any nausea or vomiting or abdominal pain at this time.  Objective: Vitals:   08/21/19 0005 08/21/19 0136 08/21/19 0406 08/21/19 0900  BP: 123/83 133/82 126/74   Pulse: (!) 111 (!) 109 (!) 103   Resp: Temp: 98.3 F (36.8 C) 98.4 F (36.9 C) 98.9 F (37.2 C)   TempSrc: Oral  Oral   SpO2: 92%  98% 96%  Weight:      Height:        Intake/Output Summary (Last 24 hours) at 08/21/2019 1039 Last data filed at 08/21/2019 0400 Gross per 24 hour  Intake 691.77 ml  Output 3300 ml  Net -2608.23 ml   Filed Weights   08/20/19 0910 08/20/19 2008  Weight: 125.6 kg 124.2 kg    Examination:  General exam: Appears calm and comfortable, morbidly obese Respiratory system: Clear to auscultation. Respiratory effort normal.  Currently on 1.5 L nasal cannula oxygen Cardiovascular system: S1 & S2 heard, RRR. No JVD, murmurs, rubs, gallops or clicks. No pedal edema. Gastrointestinal system: Abdomen is nondistended, soft and nontender. No organomegaly or masses felt. Normal bowel sounds heard.  NG tube present. Central nervous system: Alert and oriented. No focal neurological deficits. Extremities: Symmetric 5 x 5 power. Skin: No rashes, lesions or ulcers Psychiatry: Judgement and insight appear normal. Mood & affect appropriate.     Data Reviewed: I have personally reviewed following labs and imaging studies  CBC: Recent Labs  Lab 08/15/19 1412 08/20/19 1046 08/21/19 0542  WBC 8.2 16.7* 15.2*  NEUTROABS  --  14.1*  --   HGB 12.7 15.5* 13.5  HCT 39.6 48.3*  43.5  MCV 93.2 93.8 95.8  PLT 310 341 285   Basic Metabolic Panel: Recent Labs  Lab 08/15/19 1412 08/20/19 1046 08/21/19 0542  NA 140 137 138  K 4.4 3.8 3.9  CL 100 90* 96*  CO2 GLUCOSE 110* 133* 124*  BUN 16 45* 62*  CREATININE 0.71 2.23* 2.85*  CALCIUM 9.7 9.7 8.5*  MG  --  2.1  --    GFR: Estimated Creatinine Clearance: 21.6 mL/min (A) (by C-G formula based on SCr of 2.85 mg/dL (H)). Liver Function Tests: Recent Labs  Lab 08/15/19 1412 08/20/19 1046 08/21/19 0542  AST 17 34 24  ALT ALKPHOS 68 75 62  BILITOT 0.8 1.1 0.8  PROT 6.8 7.6 5.9*  ALBUMIN 3.5 3.7 2.8*   Recent Labs  Lab 08/15/19 1412 08/20/19 1046  LIPASE 17 29   No results for input(s): AMMONIA in the last 168 hours. Coagulation Profile: Recent Labs  Lab 08/20/19 1046  INR 1.1   Cardiac Enzymes: No results for input(s): CKTOTAL, CKMB, CKMBINDEX, TROPONINI  in the last 168 hours. BNP (last 3 results) No results for input(s): PROBNP in the last 8760 hours. HbA1C: No results for input(s): HGBA1C in the last 72 hours. CBG: No results for input(s): GLUCAP in the last 168 hours. Lipid Profile: No results for input(s): CHOL, HDL, LDLCALC, TRIG, CHOLHDL, LDLDIRECT in the last 72 hours. Thyroid Function Tests: No results for input(s): TSH, T4TOTAL, FREET4, T3FREE, THYROIDAB in the last 72 hours. Anemia Panel: No results for input(s): VITAMINB12, FOLATE, FERRITIN, TIBC, IRON, RETICCTPCT in the last 72 hours. Sepsis Labs: Recent Labs  Lab 08/20/19 1047 08/20/19 1248  LATICACIDVEN 1.5 1.3    Recent Results (from the past 240 hour(s))  Respiratory Panel by RT PCR (Flu A&B, Covid) - Nasopharyngeal Swab     Status: None   Collection Time: 08/20/19  9:56 AM   Specimen: Nasopharyngeal Swab  Result Value Ref Range Status   SARS Coronavirus 2 by RT PCR NEGATIVE NEGATIVE Final    Comment: (NOTE) SARS-CoV-2 target nucleic acids are NOT DETECTED. The SARS-CoV-2 RNA is generally  detectable in upper respiratoy specimens during the acute phase of infection. The lowest concentration of SARS-CoV-2 viral copies this assay can detect is 131 copies/mL. A negative result does not preclude SARS-Cov-2 infection and should not be used as the sole basis for treatment or other patient management decisions. A negative result may occur with  improper specimen collection/handling, submission of specimen other than nasopharyngeal swab, presence of viral mutation(s) within the areas targeted by this assay, and inadequate number of viral copies (<131 copies/mL). A negative result must be combined with clinical observations, patient history, and epidemiological information. The expected result is Negative. Fact Sheet for Patients:  https://www.moore.com/ Fact Sheet for Healthcare Providers:  https://www.young.biz/ This test is not yet ap proved or cleared by the [place] FDA and  has been authorized for detection and/or diagnosis of SARS-CoV-2 by FDA under an Emergency Use Authorization (EUA). This EUA will remain  in effect (meaning this test can be used) for the duration of the COVID-19 declaration under Section 564(b)(1) of the Act, 21 U.S.C. section 360bbb-3(b)(1), unless the authorization is terminated or revoked sooner.    Influenza A by PCR NEGATIVE NEGATIVE Final   Influenza B by PCR NEGATIVE NEGATIVE Final    Comment: (NOTE) The Xpert Xpress SARS-CoV-2/FLU/RSV assay is intended as an aid in  the diagnosis of influenza from Nasopharyngeal swab specimens and  should not be used as a sole basis for treatment. Nasal washings and  aspirates are unacceptable for Xpert Xpress SARS-CoV-2/FLU/RSV  testing. Fact Sheet for Patients: https://www.moore.com/ Fact Sheet for Healthcare Providers: https://www.young.biz/ This test is not yet approved or cleared by the [place] FDA and  has been  authorized for detection and/or diagnosis of SARS-CoV-2 by  FDA under an Emergency Use Authorization (EUA). This EUA will remain  in effect (meaning this test can be used) for the duration of the  Covid-19 declaration under Section 564(b)(1) of the Act, 21  U.S.C. section 360bbb-3(b)(1), unless the authorization is  terminated or revoked. Performed at [facility], [address] [place], [place] [address]   Blood Culture (routine x 2)     Status: None (Preliminary result)   Collection Time: 08/20/19 10:47 AM   Specimen: BLOOD LEFT HAND  Result Value Ref Range Status   Specimen Description BLOOD LEFT HAND DRAWN BY RN  Final   Special Requests   Final    BOTTLES DRAWN AEROBIC AND ANAEROBIC Blood Culture adequate volume  Culture   Final    NO GROWTH < 24 HOURS Performed at [facility], [address] [place], [place] [address]    Report Status PENDING  Incomplete  Blood Culture (routine x 2)     Status: None (Preliminary result)   Collection Time: 08/20/19 10:48 AM   Specimen: BLOOD RIGHT HAND  Result Value Ref Range Status   Specimen Description BLOOD RIGHT HAND DRAWN BY RN  Final   Special Requests   Final    BOTTLES DRAWN AEROBIC AND ANAEROBIC Blood Culture results may not be optimal due to an inadequate volume of blood received in culture bottles   Culture   Final    NO GROWTH < 24 HOURS Performed at [facility], [address], [place], [place] [address]    Report Status PENDING  Incomplete         Radiology Studies: CT Abdomen Pelvis Wo Contrast  Result Date: 08/20/2019 CLINICAL DATA:  Weakness with worsening low back pain. Awaiting gallbladder surgery for cholecystitis. Shortness of breath. EXAM: CT CHEST, ABDOMEN AND PELVIS WITHOUT CONTRAST TECHNIQUE: Multidetector CT imaging of the chest, abdomen and pelvis was performed following the standard protocol without IV contrast. COMPARISON:  Abdominopelvic CT 08/16/2019 and 08/12/2019. Chest CTA 05/30/2018. FINDINGS: CT  CHEST FINDINGS Cardiovascular: Mild diffuse atherosclerosis of the aorta, great vessels and coronary arteries. No acute vascular findings on noncontrast imaging. Left subclavian pacemaker leads extend into the right atrium and right ventricle. 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 Mitral annular calcifications are noted. No significant pericardial fluid. Mediastinum/Nodes: There are no enlarged mediastinal, hilar or axillary lymph nodes. The thyroid gland, trachea and esophagus demonstrate no significant findings. Lungs/Pleura: Trace dependent left pleural effusion. No pneumothorax. Atelectasis at both lung bases has mildly progressed from the recent abdominal CT. No confluent airspace opacity or significant pulmonary nodularity. Musculoskeletal/Chest wall: No chest wall mass or acute osseous findings. Multilevel thoracic spondylosis. Advanced glenohumeral arthropathy bilaterally. CT ABDOMEN AND PELVIS FINDINGS Hepatobiliary: The liver appears stable as imaged in the noncontrast state. Again demonstrated is diffuse pneumobilia with gas in the gallbladder lumen. There is mild gallbladder wall thickening and surrounding inflammatory change. 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 Pancreas: Atrophy. No pancreatic ductal dilatation or focal surrounding inflammatory change. Spleen: Normal in size without focal abnormality. Adrenals/Urinary Tract: Stable mild thickening of the adrenal glands without suspicious findings. Mild bilateral renal cortical thinning with stable exophytic renal lesions bilaterally. Of note, suspected solid lesion in the lower interpolar region of the right kidney is not well characterized on the current study due to lack of contrast. No hydronephrosis. The bladder appears unremarkable for its degree of distention. Stomach/Bowel: The stomach is now markedly distended with fluid. In addition, there is at least  moderate proximal to mid small bowel dilatation with scattered air-fluid levels consistent with a distal small bowel obstruction. There appear to be a few loops of distal decompressed small bowel, although the specific transition point is difficult to identify. Based on the prior study and clinical likelihood of gallstone ileus, there may be an obstructing gallstone in the distal small bowel (axial image 65/2 and coronal image 74/5), although this is not definitive. The colon is decompressed. Vascular/Lymphatic: There are no enlarged abdominal or pelvic lymph nodes. Aortic and branch vessel atherosclerosis. Reproductive: The uterus and ovaries appear normal. No adnexal mass. Other: Mild central mesenteric edema without focal extraluminal fluid collection. No free intraperitoneal air. No evidence of abdominal  wall hernia. Musculoskeletal: No acute or significant osseous findings. Multilevel lumbar spondylosis. Mild edema throughout the subcutaneous fat. IMPRESSION: 1. Findings are consistent with a high-grade distal small bowel obstruction, likely from an obstructing gallstone in the distal small bowel (gallstone ileus). 2. The stomach is now markedly distended with fluid, and the patient may benefit from nasogastric tube decompression. No evidence of bowel perforation or abscess. 3. 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 4. Mildly progressed bibasilar atelectasis. Trace dependent left pleural effusion. 5. Aortic Atherosclerosis (ICD10-I70.0). Electronically Signed   By: Carey BullocksWilliam  Veazey M.D.   On: 08/20/2019 13:56   CT Chest Wo Contrast  Result Date: 08/20/2019 CLINICAL DATA:  Weakness with worsening low back pain. Awaiting gallbladder surgery for cholecystitis. Shortness of breath. EXAM: CT CHEST, ABDOMEN AND PELVIS WITHOUT CONTRAST TECHNIQUE: Multidetector CT imaging of the chest, abdomen and pelvis was performed following the  standard protocol without IV contrast. COMPARISON:  Abdominopelvic CT 08/16/2019 and 08/12/2019. Chest CTA 05/30/2018. FINDINGS: CT CHEST FINDINGS Cardiovascular: Mild diffuse atherosclerosis of the aorta, great vessels and coronary arteries. No acute vascular findings on noncontrast imaging. Left subclavian pacemaker leads extend into the right atrium and right ventricle. 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 Mitral annular calcifications are noted. No significant pericardial fluid. Mediastinum/Nodes: There are no enlarged mediastinal, hilar or axillary lymph nodes. The thyroid gland, trachea and esophagus demonstrate no significant findings. Lungs/Pleura: Trace dependent left pleural effusion. No pneumothorax. Atelectasis at both lung bases has mildly progressed from the recent abdominal CT. No confluent airspace opacity or significant pulmonary nodularity. Musculoskeletal/Chest wall: No chest wall mass or acute osseous findings. Multilevel thoracic spondylosis. Advanced glenohumeral arthropathy bilaterally. CT ABDOMEN AND PELVIS FINDINGS Hepatobiliary: The liver appears stable as imaged in the noncontrast state. Again demonstrated is diffuse pneumobilia with gas in the gallbladder lumen. There is mild gallbladder wall thickening and surrounding inflammatory change. 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 Pancreas: Atrophy. No pancreatic ductal dilatation or focal surrounding inflammatory change. Spleen: Normal in size without focal abnormality. Adrenals/Urinary Tract: Stable mild thickening of the adrenal glands without suspicious findings. Mild bilateral renal cortical thinning with stable exophytic renal lesions bilaterally. Of note, suspected solid lesion in the lower interpolar region of the right kidney is not well characterized on the current study due to lack of contrast. No hydronephrosis. The bladder appears  unremarkable for its degree of distention. Stomach/Bowel: The stomach is now markedly distended with fluid. In addition, there is at least moderate proximal to mid small bowel dilatation with scattered air-fluid levels consistent with a distal small bowel obstruction. There appear to be a few loops of distal decompressed small bowel, although the specific transition point is difficult to identify. Based on the prior study and clinical likelihood of gallstone ileus, there may be an obstructing gallstone in the distal small bowel (axial image 65/2 and coronal image 74/5), although this is not definitive. The colon is decompressed. Vascular/Lymphatic: There are no enlarged abdominal or pelvic lymph nodes. Aortic and branch vessel atherosclerosis. Reproductive: The uterus and ovaries appear normal. No adnexal mass. Other: Mild central mesenteric edema without focal extraluminal fluid collection. No free intraperitoneal air. No evidence of abdominal wall hernia. Musculoskeletal: No acute or significant osseous findings. Multilevel lumbar spondylosis. Mild edema throughout the subcutaneous fat. IMPRESSION: 1. Findings are consistent with a high-grade distal small bowel obstruction, likely from an obstructing gallstone in the distal  small bowel (gallstone ileus). 2. The stomach is now markedly distended with fluid, and the patient may benefit from nasogastric tube decompression. No evidence of bowel perforation or abscess. 3. 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 4. Mildly progressed bibasilar atelectasis. Trace dependent left pleural effusion. 5. Aortic Atherosclerosis (ICD10-I70.0). Electronically Signed   By: Carey Bullocks M.D.   On: 08/20/2019 13:56   NM Pulmonary Perf and Vent  Result Date: 08/20/2019 CLINICAL DATA:  Respiratory failure with hypoxemia and acute kidney injury EXAM: NUCLEAR MEDICINE VENTILATION - PERFUSION LUNG  SCAN TECHNIQUE: Ventilation images were obtained in multiple projections using inhaled aerosol Tc-55m DTPA. Perfusion images were obtained in multiple projections after intravenous injection of Tc-12m MAA. RADIOPHARMACEUTICALS:  40 mCi of Tc-98m DTPA aerosol inhalation and 1.5 mCi Tc76m MAA IV COMPARISON:  CT chest 08/20/2019, chest radiograph 08/20/2019 FINDINGS: Ventilation: Heterogenous with diffusely decreased ventilation to the left thorax. Smaller ventilation defects at the right lung base. Moderate defect at the left lower lobe. Perfusion: Diffusely decreased perfusion to the left lung. Moderate perfusion defect left lower lobe. IMPRESSION: 1. Study is limited by habitus. 2. Diffusely decreased perfusion and ventilation to the left thorax, suspect that some of this is related to soft tissue attenuation. Moderate matched lower lung zone defect on the left with radiographic opacity. Overall intermediate probability for PE. Electronically Signed   By: Jasmine Pang M.D.   On: 08/20/2019 18:58   DG CHEST PORT 1 VIEW  Result Date: 08/20/2019 CLINICAL DATA:  NG tube placement. EXAM: PORTABLE CHEST 1 VIEW COMPARISON:  Imaging earlier this day. FINDINGS: The enteric tube is visualized to the gastroesophageal junction, side port tentatively in the lower esophagus. Left-sided pacemaker in place. Cardiomegaly with unchanged mediastinal contours. TAVR. Small left pleural effusion with multifocal atelectasis. IMPRESSION: The enteric tube is visualized to the distal esophagus, tip likely at the gastroesophageal junction, side-port tentatively seen in the distal esophagus. Recommend advancement of 8-10 cm for optimal placement. Electronically Signed   By: Narda Rutherford M.D.   On: 08/20/2019 23:56   DG Chest Port 1 View  Result Date: 08/20/2019 CLINICAL DATA:  79 year old female with history of hypoxia. EXAM: PORTABLE CHEST 1 VIEW COMPARISON:  Chest x-ray 07/04/2019. FINDINGS: Lung volumes are low. Opacity at the  left base which may reflect atelectasis and/or consolidation, possibly with small left pleural effusion. Right lung is clear. No definite right pleural effusion. No pneumothorax. No definite suspicious appearing pulmonary nodules or masses are noted. No evidence of pulmonary edema. Mild cardiomegaly. Upper mediastinal contours are distorted by patient positioning. Aortic atherosclerosis. Status post TAVR. Left-sided pacemaker device in place with lead tips projecting over the expected location of the right atrium and right ventricle. IMPRESSION: 1. Low lung volumes with atelectasis and/or consolidation in the left lower lobe with probable small left pleural effusion. 2. Mild cardiomegaly. 3. Aortic atherosclerosis. 4. Postprocedural changes and support apparatus, as above. Electronically Signed   By: Trudie Reed M.D.   On: 08/20/2019 10:33        Scheduled Meds:  enoxaparin (LOVENOX) injection  120 mg Subcutaneous Q24H   pantoprazole (PROTONIX) IV  40 mg Intravenous Q24H   Continuous Infusions:  lactated ringers     lactated ringers       LOS: 1 day    Time spent: 35 minutes    Quindon Denker Hoover Brunette, DO [facility]  If 7PM-7AM, please contact night-coverage [email or web address] 08/21/2019, 10:39 AM

## 2019-08-22 LAB — COMPREHENSIVE METABOLIC PANEL
ALT: 17 U/L (ref 0–44)
AST: 18 U/L (ref 15–41)
Albumin: 2.5 g/dL — ABNORMAL LOW (ref 3.5–5.0)
Alkaline Phosphatase: 56 U/L (ref 38–126)
Anion gap: 13 (ref 5–15)
BUN: 70 mg/dL — ABNORMAL HIGH (ref 8–23)
CO2: 27 mmol/L (ref 22–32)
Calcium: 8.2 mg/dL — ABNORMAL LOW (ref 8.9–10.3)
Chloride: 100 mmol/L (ref 98–111)
Creatinine, Ser: 2.18 mg/dL — ABNORMAL HIGH (ref 0.44–1.00)
GFR calc Af Amer: 24 mL/min — ABNORMAL LOW (ref >60)
GFR calc non Af Amer: 21 mL/min — ABNORMAL LOW (ref >60)
Glucose, Bld: 88 mg/dL (ref 70–99)
Potassium: 3.5 mmol/L (ref 3.5–5.1)
Sodium: 140 mmol/L (ref 135–145)
Total Bilirubin: 0.7 mg/dL (ref 0.3–1.2)
Total Protein: 5.6 g/dL — ABNORMAL LOW (ref 6.5–8.1)

## 2019-08-22 LAB — CBC
HCT: 40.2 % (ref 36.0–46.0)
Hemoglobin: 12.3 g/dL (ref 12.0–15.0)
MCH: 30.1 pg (ref 26.0–34.0)
MCHC: 30.6 g/dL (ref 30.0–36.0)
MCV: 98.3 fL (ref 80.0–100.0)
Platelets: 235 10*3/uL (ref 150–400)
RBC: 4.09 MIL/uL (ref 3.87–5.11)
RDW: 15.3 % (ref 11.5–15.5)
WBC: 9.6 10*3/uL (ref 4.0–10.5)
nRBC: 0 % (ref 0.0–0.2)

## 2019-08-22 LAB — URINE CULTURE: Culture: >=100000 — AB

## 2019-08-22 LAB — MAGNESIUM: Magnesium: 2.1 mg/dL (ref 1.7–2.4)

## 2019-08-22 LAB — SODIUM, URINE, RANDOM: Sodium, Ur: 32 mmol/L

## 2019-08-22 LAB — CREATININE, URINE, RANDOM: Creatinine, Urine: 122.84 mg/dL

## 2019-08-22 MED ORDER — BISACODYL 10 MG RE SUPP
10.0000 mg | Freq: Two times a day (BID) | RECTAL | Status: DC
  Administered 2019-08-22 – 2019-08-26 (×7): 10 mg via RECTAL
  Filled 2019-08-22 (×7): qty 1

## 2019-08-22 NOTE — Plan of Care (Signed)

## 2019-08-22 NOTE — Progress Notes (Signed)
Subjective: Resting comfortably.  No significant abdominal pain noted.  No bowel movement yet.  Objective: Vital signs in last 24 hours: Temp:  [97.9 F (36.6 C)-98.2 F (36.8 C)] 98.2 F (36.8 C) (04/23 0536) Pulse Rate:  [89-95] 89 (04/23 0536) Resp:  [16-18] 16 (04/23 0536) BP: (144-145)/(71-75) 145/75 (04/23 0536) SpO2:  [95 %-98 %] 95 % (04/23 0536)    Intake/Output from previous day: 04/22 0701 - 04/23 0700 In: 1957.4 [I.V.:1957.4] Out: 1500 [Emesis/NG output:1500] Intake/Output this shift: No intake/output data recorded.  General appearance: alert, cooperative and no distress GI: soft, non-tender; bowel sounds normal; no masses,  no organomegaly  Lab Results:  Recent Labs    08/21/19 0542 08/22/19 0512  WBC 15.2* 9.6  HGB 13.5 12.3  HCT 43.5 40.2  PLT 285 235   BMET Recent Labs    08/21/19 0542 08/22/19 0512  NA 138 140  K 3.9 3.5  CL 96* 100  CO2 29 27  GLUCOSE 124* 88  BUN 62* 70*  CREATININE 2.85* 2.18*  CALCIUM 8.5* 8.2*   PT/INR Recent Labs    08/20/19 1046  LABPROT 14.1  INR 1.1    Studies/Results: CT Abdomen Pelvis Wo Contrast  Result Date: 08/20/2019 CLINICAL DATA:  Weakness with worsening low back pain. Awaiting gallbladder surgery for cholecystitis. Shortness of breath. EXAM: CT CHEST, ABDOMEN AND PELVIS WITHOUT CONTRAST TECHNIQUE: Multidetector CT imaging of the chest, abdomen and pelvis was performed following the standard protocol without IV contrast. COMPARISON:  Abdominopelvic CT 08/16/2019 and 08/12/2019. Chest CTA 05/30/2018. FINDINGS: CT CHEST FINDINGS Cardiovascular: Mild diffuse atherosclerosis of the aorta, great vessels and coronary arteries. No acute vascular findings on noncontrast imaging. Left subclavian pacemaker leads extend into the right atrium and right ventricle. 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 Mitral annular calcifications are noted. No significant pericardial fluid. Mediastinum/Nodes: There are no enlarged  mediastinal, hilar or axillary lymph nodes. The thyroid gland, trachea and esophagus demonstrate no significant findings. Lungs/Pleura: Trace dependent left pleural effusion. No pneumothorax. Atelectasis at both lung bases has mildly progressed from the recent abdominal CT. No confluent airspace opacity or significant pulmonary nodularity. Musculoskeletal/Chest wall: No chest wall mass or acute osseous findings. Multilevel thoracic spondylosis. Advanced glenohumeral arthropathy bilaterally. CT ABDOMEN AND PELVIS FINDINGS Hepatobiliary: The liver appears stable as imaged in the noncontrast state. Again demonstrated is diffuse pneumobilia with gas in the gallbladder lumen. There is mild gallbladder wall thickening and surrounding inflammatory change. 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 Pancreas: Atrophy. No pancreatic ductal dilatation or focal surrounding inflammatory change. Spleen: Normal in size without focal abnormality. Adrenals/Urinary Tract: Stable mild thickening of the adrenal glands without suspicious findings. Mild bilateral renal cortical thinning with stable exophytic renal lesions bilaterally. Of note, suspected solid lesion in the lower interpolar region of the right kidney is not well characterized on the current study due to lack of contrast. No hydronephrosis. The bladder appears unremarkable for its degree of distention. Stomach/Bowel: The stomach is now markedly distended with fluid. In addition, there is at least moderate proximal to mid small bowel dilatation with scattered air-fluid levels consistent with a distal small bowel obstruction. There appear to be a few loops of distal decompressed small bowel, although the specific transition point is difficult to identify. Based on the prior study and clinical likelihood of gallstone ileus, there may be an obstructing gallstone in the distal small bowel  (axial image 65/2 and coronal image 74/5), although this  is not definitive. The colon is decompressed. Vascular/Lymphatic: There are no enlarged abdominal or pelvic lymph nodes. Aortic and branch vessel atherosclerosis. Reproductive: The uterus and ovaries appear normal. No adnexal mass. Other: Mild central mesenteric edema without focal extraluminal fluid collection. No free intraperitoneal air. No evidence of abdominal wall hernia. Musculoskeletal: No acute or significant osseous findings. Multilevel lumbar spondylosis. Mild edema throughout the subcutaneous fat. IMPRESSION: 1. Findings are consistent with a high-grade distal small bowel obstruction, likely from an obstructing gallstone in the distal small bowel (gallstone ileus). 2. The stomach is now markedly distended with fluid, and the patient may benefit from nasogastric tube decompression. No evidence of bowel perforation or abscess. 3. 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 4. Mildly progressed bibasilar atelectasis. Trace dependent left pleural effusion. 5. Aortic Atherosclerosis (ICD10-I70.0). Electronically Signed   By: Richardean Sale M.D.   On: 08/20/2019 13:56   CT Chest Wo Contrast  Result Date: 08/20/2019 CLINICAL DATA:  Weakness with worsening low back pain. Awaiting gallbladder surgery for cholecystitis. Shortness of breath. EXAM: CT CHEST, ABDOMEN AND PELVIS WITHOUT CONTRAST TECHNIQUE: Multidetector CT imaging of the chest, abdomen and pelvis was performed following the standard protocol without IV contrast. COMPARISON:  Abdominopelvic CT 08/16/2019 and 08/12/2019. Chest CTA 05/30/2018. FINDINGS: CT CHEST FINDINGS Cardiovascular: Mild diffuse atherosclerosis of the aorta, great vessels and coronary arteries. No acute vascular findings on noncontrast imaging. Left subclavian pacemaker leads extend into the right atrium and right ventricle. The heart is  enlarged post TAVR. Mitral annular calcifications are noted. No significant pericardial fluid. Mediastinum/Nodes: There are no enlarged mediastinal, hilar or axillary lymph nodes. The thyroid gland, trachea and esophagus demonstrate no significant findings. Lungs/Pleura: Trace dependent left pleural effusion. No pneumothorax. Atelectasis at both lung bases has mildly progressed from the recent abdominal CT. No confluent airspace opacity or significant pulmonary nodularity. Musculoskeletal/Chest wall: No chest wall mass or acute osseous findings. Multilevel thoracic spondylosis. Advanced glenohumeral arthropathy bilaterally. CT ABDOMEN AND PELVIS FINDINGS Hepatobiliary: The liver appears stable as imaged in the noncontrast state. Again demonstrated is diffuse pneumobilia with gas in the gallbladder lumen. There is mild gallbladder wall thickening and surrounding inflammatory change. 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 Pancreas: Atrophy. No pancreatic ductal dilatation or focal surrounding inflammatory change. Spleen: Normal in size without focal abnormality. Adrenals/Urinary Tract: Stable mild thickening of the adrenal glands without suspicious findings. Mild bilateral renal cortical thinning with stable exophytic renal lesions bilaterally. Of note, suspected solid lesion in the lower interpolar region of the right kidney is not well characterized on the current study due to lack of contrast. No hydronephrosis. The bladder appears unremarkable for its degree of distention. Stomach/Bowel: The stomach is now markedly distended with fluid. In addition, there is at least moderate proximal to mid small bowel dilatation with scattered air-fluid levels consistent with a distal small bowel obstruction. There appear to be a few loops of distal decompressed small bowel, although the specific transition point is difficult to  identify. Based on the prior study and clinical likelihood of gallstone ileus, there may be an obstructing gallstone in the distal small bowel (axial image 65/2 and coronal image 74/5), although this is not definitive. The colon is decompressed. Vascular/Lymphatic: There are no enlarged abdominal or pelvic lymph nodes. Aortic and branch vessel atherosclerosis. Reproductive: The uterus and ovaries appear normal. No adnexal mass. Other: Mild central mesenteric edema without  focal extraluminal fluid collection. No free intraperitoneal air. No evidence of abdominal wall hernia. Musculoskeletal: No acute or significant osseous findings. Multilevel lumbar spondylosis. Mild edema throughout the subcutaneous fat. IMPRESSION: 1. Findings are consistent with a high-grade distal small bowel obstruction, likely from an obstructing gallstone in the distal small bowel (gallstone ileus). 2. The stomach is now markedly distended with fluid, and the patient may benefit from nasogastric tube decompression. No evidence of bowel perforation or abscess. 3. 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 4. Mildly progressed bibasilar atelectasis. Trace dependent left pleural effusion. 5. Aortic Atherosclerosis (ICD10-I70.0). Electronically Signed   By: Carey Bullocks M.D.   On: 08/20/2019 13:56   US RENAL  Result Date: 08/21/2019 CLINICAL DATA:  Acute kidney injury. EXAM: RENAL / URINARY TRACT ULTRASOUND COMPLETE COMPARISON:  None. FINDINGS: Right Kidney: Renal measurements: 9.1 x 5.8 x 5.2 cm = volume: 144 mL . Echogenicity within normal limits. No mass or hydronephrosis visualized. Left Kidney: Not visualized. Bladder: Not seen, presumably decompressed. Other: None. IMPRESSION: 1. Visualized portion of the RIGHT kidney appears normal. Earlier CT abdomen of 08/16/2019 described a solid/cystic appearing lesion arising from the lower pole of the RIGHT kidney.  This mass is not able to be visualized by ultrasound, presumably obscured by overlying bowel gas. Consider MRI for definitive characterization. When the patient is clinically stable and able to follow directions and hold their breath (preferably as an outpatient) further evaluation with dedicated abdominal MRI should be considered. 2. LEFT kidney is not able to be visualized by ultrasound, presumably obscured by overlying bowel gas. 3. No acute appearing renal findings.  No hydronephrosis. 4. Bladder is not seen, presumably decompressed. Electronically Signed   By: Bary Richard M.D.   On: 08/21/2019 14:25   NM Pulmonary Perf and Vent  Result Date: 08/20/2019 CLINICAL DATA:  Respiratory failure with hypoxemia and acute kidney injury EXAM: NUCLEAR MEDICINE VENTILATION - PERFUSION LUNG SCAN TECHNIQUE: Ventilation images were obtained in multiple projections using inhaled aerosol Tc-40m DTPA. Perfusion images were obtained in multiple projections after intravenous injection of Tc-54m MAA. RADIOPHARMACEUTICALS:  40 mCi of Tc-77m DTPA aerosol inhalation and 1.5 mCi Tc27m MAA IV COMPARISON:  CT chest 08/20/2019, chest radiograph 08/20/2019 FINDINGS: Ventilation: Heterogenous with diffusely decreased ventilation to the left thorax. Smaller ventilation defects at the right lung base. Moderate defect at the left lower lobe. Perfusion: Diffusely decreased perfusion to the left lung. Moderate perfusion defect left lower lobe. IMPRESSION: 1. Study is limited by habitus. 2. Diffusely decreased perfusion and ventilation to the left thorax, suspect that some of this is related to soft tissue attenuation. Moderate matched lower lung zone defect on the left with radiographic opacity. Overall intermediate probability for PE. Electronically Signed   By: Jasmine Pang M.D.   On: 08/20/2019 18:58   DG CHEST PORT 1 VIEW  Result Date: 08/20/2019 CLINICAL DATA:  NG tube placement. EXAM: PORTABLE CHEST 1 VIEW COMPARISON:  Imaging  earlier this day. FINDINGS: The enteric tube is visualized to the gastroesophageal junction, side port tentatively in the lower esophagus. Left-sided pacemaker in place. Cardiomegaly with unchanged mediastinal contours. TAVR. Small left pleural effusion with multifocal atelectasis. IMPRESSION: The enteric tube is visualized to the distal esophagus, tip likely at the gastroesophageal junction, side-port tentatively seen in the distal esophagus. Recommend advancement of 8-10 cm for optimal placement. Electronically Signed   By: Narda Rutherford M.D.   On: 08/20/2019 23:56   DG  Chest Port 1 View  Result Date: 08/20/2019 CLINICAL DATA:  79 year old female with history of hypoxia. EXAM: PORTABLE CHEST 1 VIEW COMPARISON:  Chest x-ray 07/04/2019. FINDINGS: Lung volumes are low. Opacity at the left base which may reflect atelectasis and/or consolidation, possibly with small left pleural effusion. Right lung is clear. No definite right pleural effusion. No pneumothorax. No definite suspicious appearing pulmonary nodules or masses are noted. No evidence of pulmonary edema. Mild cardiomegaly. Upper mediastinal contours are distorted by patient positioning. Aortic atherosclerosis. Status post TAVR. Left-sided pacemaker device in place with lead tips projecting over the expected location of the right atrium and right ventricle. IMPRESSION: 1. Low lung volumes with atelectasis and/or consolidation in the left lower lobe with probable small left pleural effusion. 2. Mild cardiomegaly. 3. Aortic atherosclerosis. 4. Postprocedural changes and support apparatus, as above. Electronically Signed   By: Trudie Reed M.D.   On: 08/20/2019 10:33   DG Abd Portable 1V  Result Date: 08/21/2019 CLINICAL DATA:  NG tube placement. Small bowel obstruction. EXAM: PORTABLE ABDOMEN - 1 VIEW COMPARISON:  CT scan of the abdomen dated 08/20/2019 FINDINGS: NG tube tip appears to be distal body of the stomach. Persistent dilated small bowel.  The colon is not distended. Air in the biliary tree as noted on the prior CT scan. No acute bone abnormality. IMPRESSION: NG tube tip appears to be in the distal body of the stomach. Persistent small bowel obstruction. Electronically Signed   By: Francene Boyers M.D.   On: 08/21/2019 10:50    Anti-infectives: Anti-infectives (From admission, onward)   Start     Dose/Rate Route Frequency Ordered Stop   08/22/19 1100  vancomycin (VANCOREADY) IVPB 1750 mg/350 mL  Status:  Discontinued     1,750 mg 175 mL/hr over 120 Minutes Intravenous Every 48 hours 08/20/19 1550 08/20/19 1947   08/21/19 1000  ceFEPIme (MAXIPIME) 2 g in sodium chloride 0.9 % 100 mL IVPB  Status:  Discontinued     2 g 200 mL/hr over 30 Minutes Intravenous Every 24 hours 08/20/19 1550 08/20/19 1947   08/20/19 1100  vancomycin (VANCOREADY) IVPB 2000 mg/400 mL     2,000 mg 200 mL/hr over 120 Minutes Intravenous  Once 08/20/19 1007 08/20/19 1332   08/20/19 1000  ceFEPIme (MAXIPIME) 2 g in sodium chloride 0.9 % 100 mL IVPB     2 g 200 mL/hr over 30 Minutes Intravenous  Once 08/20/19 0954 08/20/19 1342   08/20/19 1000  metroNIDAZOLE (FLAGYL) IVPB 500 mg     500 mg 100 mL/hr over 60 Minutes Intravenous  Once 08/20/19 0954 08/20/19 1332   08/20/19 1000  vancomycin (VANCOCIN) IVPB 1000 mg/200 mL premix  Status:  Discontinued     1,000 mg 200 mL/hr over 60 Minutes Intravenous  Once 08/20/19 0954 08/20/19 1007      Assessment/Plan: Impression: Partial bowel obstruction, question gallstone ileus versus ileus of unknown etiology.  Leukocytosis has resolved.  Still with elevated NG tube output.  No return of bowel function yet. Plan: No need for acute surgical intervention at this time.  Continue NG tube decompression.  LOS: 2 days    Franky Macho 08/22/2019

## 2019-08-22 NOTE — Progress Notes (Signed)
PROGRESS NOTE    Dawn Gonzales  FBP:[id number] DOB: [birth year] DOA: 08/20/2019 PCP: Benita Stabile, MD   Brief Narrative:  Per HPI: Dawn Studzinski [place] a 79 y.o.femalewith medical history significant forcomplete heart block with pacemaker,diastolic CHF, morbid obesity, aortic stenosis status post TAVR, RBBB. EMS was called out for generalized weakness, and low back pain. Patient was found to be hypoxic with O2 sats 80% on room air. Patient is not on home oxygen.  Patient denies difficulty breathing, has mild chronic cough that is unchanged, no wheezing. Reports some mild occasional sharp chest pains on the left side of her chest, but she has not had any chest pains today. She is unaware if she has had any leg swelling or redness, asshehas not looked ather legs. Today she has had shoulder pains. She has chronic arthritis back pain, that is unchanged from prior,For which she takes Tylenol. Denies falls. She lives alone, but her daughter comes in to check on her. She ambulates with a wheelchair. She tells me she has had multiple episodes of vomiting every day over the past few days, she has not had any vomiting today. She also reports poor p.o. intake. Last bowel movement was a few days ago,she is unsure of exactly when. She denies abdominal pain. She denies urinary frequency or pain with urination. She denies bloody stools vomiting of blood or black stools since discharge.  4/22: Patient has been admitted with concern for gallstone ileus and has associated AKI as well as acute hypoxemic respiratory failure.  There was some question of possible PE and now she has an indeterminate VQ scan.  She remains on full dose Lovenox at the moment and has worsening AKI and will require greater amounts of IV fluid since she still has significant NG tube output noted.  LVEF noted to be 60 to 65% on 07/06/2019.  Continue to monitor labs.  General surgery evaluation with plans for  conservative management at this point using NG tube.  4/23: Patient continues to have high amounts of NG tube output with no bowel movement noted.  Her renal function is improving on aggressive IV fluid hydration.  Continue monitor a.m. labs.  Assessment & Plan:   Principal Problem:   Hypoxia Active Problems:   Obesity, Class III, BMI 40-49.9 (morbid obesity) (HCC)   Sleep apnea   Hypertension   Chronic diastolic CHF (congestive heart failure), NYHA class 2 (HCC)   Gallstone ileus (HCC)   Complete heart block (HCC)   History of cardiac pacemaker   Small bowel obstruction (HCC)   Generalized weakness likely secondary to dehydration/AKI in the setting of gallstone ileus -Continue overall hydration and conservative management as noted below -Mostly wheelchair-bound at home  Ileus -Possibly related to gallstones, but this remains uncertain -Continue NG tube decompression -Monitor electrolytes -Appreciate general surgery evaluation  AKI-improving -Likely prerenal -Noted to have solid mass to right kidney on CT with renal ultrasound performed.  Patient is aware of this and should follow-up with MRI in the outpatient setting. -Urine electrolytes pending -Aggressive IV fluid and monitor I's and O's -Avoid nephrotoxic agents and follow a.m. labs  Acute hypoxemic respiratory failure-multifactorial -Very questionable if this is actually a PE, I doubt this given the fact that she has had no significant chest pain, cough, or even shortness of breath -Continue full dose Lovenox for now, and hopefully proper study can be obtained prior to discharge -May very well be related more to OHS/OSA and atelectasis -Continue incentive spirometry and wean  oxygen as tolerated  Prolonged QTC - 537 ms on EKG which is improved today -Continue to monitor a.m. potassium and magnesium   Chronic diastolic CHF with permanent pacemaker and TAVR -Last 2D echocardiogram 06/2019 with EF 60-65% -Okay  for aggressive hydration -Monitor carefully  OSA/morbid obesity -Likely source of hypoxemia -CPAP nightly  DVT prophylaxis: Full dose Lovenox Code Status: Full code Family Communication: Discussed with daughter on phone, [place] Disposition Plan: Continue conservative management with NG tube as well as aggressive IV fluid and evaluation of urine electrolytes pending.  Monitor a.m. labs.   Consultants:   General Surgery  Procedures:   None  Antimicrobials:   None  Subjective: Patient seen and evaluated today with no new acute complaints or concerns. No acute concerns or events noted overnight.  She continues to have high NG tube output, but no significant abdominal pain or bowel movement.  Objective: Vitals:   08/21/19 0800 08/21/19 0900 08/21/19 2117 08/22/19 0536  BP: 122/78  (!) 144/71 (!) 145/75  Pulse: (!) 102  95 89  Resp: 18  18 16   Temp: 98 F (36.7 C)  97.9 F (36.6 C) 98.2 F (36.8 C)  TempSrc: Oral  Oral Oral  SpO2: 95% 96% 98% 95%  Weight:      Height:        Intake/Output Summary (Last 24 hours) at 08/22/2019 1319 Last data filed at 08/22/2019 1037 Gross per 24 hour  Intake 1957.35 ml  Output 2300 ml  Net -342.65 ml   Filed Weights   08/20/19 0910 08/20/19 2008  Weight: 125.6 kg 124.2 kg    Examination:  General exam: Appears calm and comfortable, obese Respiratory system: Clear to auscultation. Respiratory effort normal. Cardiovascular system: S1 & S2 heard, RRR. No JVD, murmurs, rubs, gallops or clicks. No pedal edema. Gastrointestinal system: Abdomen is nondistended, soft and nontender. No organomegaly or masses felt. Normal bowel sounds heard.  NG tube present. Central nervous system: Alert and oriented. No focal neurological deficits. Extremities: Symmetric 5 x 5 power. Skin: No rashes, lesions or ulcers Psychiatry: Flat affect.   Data Reviewed: I have personally reviewed following labs and imaging  studies  CBC: Recent Labs  Lab 08/15/19 1412 08/20/19 1046 08/21/19 0542 08/22/19 0512  WBC 8.2 16.7* 15.2* 9.6  NEUTROABS  --  14.1*  --   --   HGB 12.7 15.5* 13.5 12.3  HCT 39.6 48.3* 43.5 40.2  MCV 93.2 93.8 95.8 98.3  PLT 310 341 285 102   Basic Metabolic Panel: Recent Labs  Lab 08/15/19 1412 08/20/19 1046 08/21/19 0542 08/22/19 0512  NA 140 137 138 140  K 4.4 3.8 3.9 3.5  CL 100 90* 96* 100  CO2 30 31 29 27   GLUCOSE 110* 133* 124* 88  BUN 16 45* 62* 70*  CREATININE 0.71 2.23* 2.85* 2.18*  CALCIUM 9.7 9.7 8.5* 8.2*  MG  --  2.1  --  2.1   GFR: Estimated Creatinine Clearance: 28.2 mL/min (A) (by C-G formula based on SCr of 2.18 mg/dL (H)). Liver Function Tests: Recent Labs  Lab 08/15/19 1412 08/20/19 1046 08/21/19 0542 08/22/19 0512  AST 17 34 24 18  ALT 11 22 20 17   ALKPHOS 68 75 62 56  BILITOT 0.8 1.1 0.8 0.7  PROT 6.8 7.6 5.9* 5.6*  ALBUMIN 3.5 3.7 2.8* 2.5*   Recent Labs  Lab 08/15/19 1412 08/20/19 1046  LIPASE 17 29   No results for input(s): AMMONIA in the last 168 hours. Coagulation  Profile: Recent Labs  Lab 08/20/19 1046  INR 1.1   Cardiac Enzymes: No results for input(s): CKTOTAL, CKMB, CKMBINDEX, TROPONINI in the last 168 hours. BNP (last 3 results) No results for input(s): PROBNP in the last 8760 hours. HbA1C: No results for input(s): HGBA1C in the last 72 hours. CBG: No results for input(s): GLUCAP in the last 168 hours. Lipid Profile: No results for input(s): CHOL, HDL, LDLCALC, TRIG, CHOLHDL, LDLDIRECT in the last 72 hours. Thyroid Function Tests: No results for input(s): TSH, T4TOTAL, FREET4, T3FREE, THYROIDAB in the last 72 hours. Anemia Panel: No results for input(s): VITAMINB12, FOLATE, FERRITIN, TIBC, IRON, RETICCTPCT in the last 72 hours. Sepsis Labs: Recent Labs  Lab 08/20/19 1047 08/20/19 1248  LATICACIDVEN 1.5 1.3    Recent Results (from the past 240 hour(s))  Respiratory Panel by RT PCR (Flu A&B, Covid) -  Nasopharyngeal Swab     Status: None   Collection Time: 08/20/19  9:56 AM   Specimen: Nasopharyngeal Swab  Result Value Ref Range Status   SARS Coronavirus 2 by RT PCR NEGATIVE NEGATIVE Final    Comment: (NOTE) SARS-CoV-2 target nucleic acids are NOT DETECTED. The SARS-CoV-2 RNA is generally detectable in upper respiratoy specimens during the acute phase of infection. The lowest concentration of SARS-CoV-2 viral copies this assay can detect is 131 copies/mL. A negative result does not preclude SARS-Cov-2 infection and should not be used as the sole basis for treatment or other patient management decisions. A negative result may occur with  improper specimen collection/handling, submission of specimen other than nasopharyngeal swab, presence of viral mutation(s) within the areas targeted by this assay, and inadequate number of viral copies (<131 copies/mL). A negative result must be combined with clinical observations, patient history, and epidemiological information. The expected result is Negative. Fact Sheet for Patients:  https://www.moore.com/https://www.fda.gov/media/142436/download Fact Sheet for Healthcare Providers:  https://www.young.biz/https://www.fda.gov/media/142435/download This test is not yet ap proved or cleared by the [place] FDA and  has been authorized for detection and/or diagnosis of SARS-CoV-2 by FDA under an Emergency Use Authorization (EUA). This EUA will remain  in effect (meaning this test can be used) for the duration of the COVID-19 declaration under Section 564(b)(1) of the Act, 21 U.S.C. section 360bbb-3(b)(1), unless the authorization is terminated or revoked sooner.    Influenza A by PCR NEGATIVE NEGATIVE Final   Influenza B by PCR NEGATIVE NEGATIVE Final    Comment: (NOTE) The Xpert Xpress SARS-CoV-2/FLU/RSV assay is intended as an aid in  the diagnosis of influenza from Nasopharyngeal swab specimens and  should not be used as a sole basis for treatment. Nasal washings and  aspirates  are unacceptable for Xpert Xpress SARS-CoV-2/FLU/RSV  testing. Fact Sheet for Patients: https://www.moore.com/https://www.fda.gov/media/142436/download Fact Sheet for Healthcare Providers: https://www.young.biz/https://www.fda.gov/media/142435/download This test is not yet approved or cleared by the [place] FDA and  has been authorized for detection and/or diagnosis of SARS-CoV-2 by  FDA under an Emergency Use Authorization (EUA). This EUA will remain  in effect (meaning this test can be used) for the duration of the  Covid-19 declaration under Section 564(b)(1) of the Act, 21  U.S.C. section 360bbb-3(b)(1), unless the authorization is  terminated or revoked. Performed at [facility], [address] [place], [place] [address]   Blood Culture (routine x 2)     Status: None (Preliminary result)   Collection Time: 08/20/19 10:47 AM   Specimen: BLOOD LEFT HAND  Result Value Ref Range Status   Specimen Description BLOOD LEFT HAND DRAWN BY  RN  Final   Special Requests   Final    BOTTLES DRAWN AEROBIC AND ANAEROBIC Blood Culture adequate volume   Culture   Final    NO GROWTH 2 DAYS Performed at [facility], [address] [place], [place] [address]    Report Status PENDING  Incomplete  Blood Culture (routine x 2)     Status: None (Preliminary result)   Collection Time: 08/20/19 10:48 AM   Specimen: BLOOD RIGHT HAND  Result Value Ref Range Status   Specimen Description BLOOD RIGHT HAND DRAWN BY RN  Final   Special Requests   Final    BOTTLES DRAWN AEROBIC AND ANAEROBIC Blood Culture results may not be optimal due to an inadequate volume of blood received in culture bottles   Culture   Final    NO GROWTH 2 DAYS Performed at [facility], [address] [place], [place] [address]    Report Status PENDING  Incomplete  Urine culture     Status: Abnormal   Collection Time: 08/20/19  1:49 PM   Specimen: Urine, Catheterized  Result Value Ref Range Status   Specimen Description   Final    URINE, CATHETERIZED Performed at  [facility], [address], [place], [place] [address]    Special Requests   Final    NONE Performed at [facility], [address] [place], [place] [address]    Culture (A)  Final    >=100,000 COLONIES/mL LACTOBACILLUS SPECIES Standardized susceptibility testing for this organism is not available. Performed at [facility], [address], [place], [place] [address]    Report Status 08/22/2019 FINAL  Final         Radiology Studies: US RENAL  Result Date: 08/21/2019 CLINICAL DATA:  Acute kidney injury. EXAM: RENAL / URINARY TRACT ULTRASOUND COMPLETE COMPARISON:  None. FINDINGS: Right Kidney: Renal measurements: 9.1 x 5.8 x 5.2 cm = volume: 144 mL . Echogenicity within normal limits. No mass or hydronephrosis visualized. Left Kidney: Not visualized. Bladder: Not seen, presumably decompressed. Other: None. IMPRESSION: 1. Visualized portion of the RIGHT kidney appears normal. Earlier CT abdomen of 08/16/2019 described a solid/cystic appearing lesion arising from the lower pole of the RIGHT kidney. This mass is not able to be visualized by ultrasound, presumably obscured by overlying bowel gas. Consider MRI for definitive characterization. When the patient is clinically stable and able to follow directions and hold their breath (preferably as an outpatient) further evaluation with dedicated abdominal MRI should be considered. 2. LEFT kidney is not able to be visualized by ultrasound, presumably obscured by overlying bowel gas. 3. No acute appearing renal findings.  No hydronephrosis. 4. Bladder is not seen, presumably decompressed. Electronically Signed   By: Bary Richard M.D.   On: 08/21/2019 14:25   NM Pulmonary Perf and Vent  Result Date: 08/20/2019 CLINICAL DATA:  Respiratory failure with hypoxemia and acute kidney injury EXAM: NUCLEAR MEDICINE VENTILATION - PERFUSION LUNG SCAN TECHNIQUE: Ventilation images were obtained in multiple projections using inhaled aerosol Tc-74m DTPA.  Perfusion images were obtained in multiple projections after intravenous injection of Tc-98m MAA. RADIOPHARMACEUTICALS:  40 mCi of Tc-43m DTPA aerosol inhalation and 1.5 mCi Tc33m MAA IV COMPARISON:  CT chest 08/20/2019, chest radiograph 08/20/2019 FINDINGS: Ventilation: Heterogenous with diffusely decreased ventilation to the left thorax. Smaller ventilation defects at the right lung base. Moderate defect at the left lower lobe. Perfusion: Diffusely decreased perfusion to the left lung. Moderate perfusion defect left lower lobe. IMPRESSION: 1. Study  is limited by habitus. 2. Diffusely decreased perfusion and ventilation to the left thorax, suspect that some of this is related to soft tissue attenuation. Moderate matched lower lung zone defect on the left with radiographic opacity. Overall intermediate probability for PE. Electronically Signed   By: Jasmine Pang M.D.   On: 08/20/2019 18:58   DG CHEST PORT 1 VIEW  Result Date: 08/20/2019 CLINICAL DATA:  NG tube placement. EXAM: PORTABLE CHEST 1 VIEW COMPARISON:  Imaging earlier this day. FINDINGS: The enteric tube is visualized to the gastroesophageal junction, side port tentatively in the lower esophagus. Left-sided pacemaker in place. Cardiomegaly with unchanged mediastinal contours. TAVR. Small left pleural effusion with multifocal atelectasis. IMPRESSION: The enteric tube is visualized to the distal esophagus, tip likely at the gastroesophageal junction, side-port tentatively seen in the distal esophagus. Recommend advancement of 8-10 cm for optimal placement. Electronically Signed   By: Narda Rutherford M.D.   On: 08/20/2019 23:56   DG Abd Portable 1V  Result Date: 08/21/2019 CLINICAL DATA:  NG tube placement. Small bowel obstruction. EXAM: PORTABLE ABDOMEN - 1 VIEW COMPARISON:  CT scan of the abdomen dated 08/20/2019 FINDINGS: NG tube tip appears to be distal body of the stomach. Persistent dilated small bowel. The colon is not distended. Air in the  biliary tree as noted on the prior CT scan. No acute bone abnormality. IMPRESSION: NG tube tip appears to be in the distal body of the stomach. Persistent small bowel obstruction. Electronically Signed   By: Francene Boyers M.D.   On: 08/21/2019 10:50        Scheduled Meds:  bisacodyl  10 mg Rectal BID   enoxaparin (LOVENOX) injection  120 mg Subcutaneous Q24H   pantoprazole (PROTONIX) IV  40 mg Intravenous Q24H   Continuous Infusions:  lactated ringers 150 mL/hr at 08/22/19 0200     LOS: 2 days    Time spent: 30 minutes    Ramello Cordial Hoover Brunette, DO [facility]  If 7PM-7AM, please contact night-coverage [email or web address] 08/22/2019, 1:19 PM

## 2019-08-23 ENCOUNTER — Inpatient Hospital Stay (HOSPITAL_COMMUNITY): Payer: Medicare PPO

## 2019-08-23 LAB — COMPREHENSIVE METABOLIC PANEL
ALT: 15 U/L (ref 0–44)
AST: 16 U/L (ref 15–41)
Albumin: 2.3 g/dL — ABNORMAL LOW (ref 3.5–5.0)
Alkaline Phosphatase: 48 U/L (ref 38–126)
Anion gap: 12 (ref 5–15)
BUN: 52 mg/dL — ABNORMAL HIGH (ref 8–23)
CO2: 29 mmol/L (ref 22–32)
Calcium: 8.4 mg/dL — ABNORMAL LOW (ref 8.9–10.3)
Chloride: 101 mmol/L (ref 98–111)
Creatinine, Ser: 1.17 mg/dL — ABNORMAL HIGH (ref 0.44–1.00)
GFR calc Af Amer: 51 mL/min — ABNORMAL LOW (ref >60)
GFR calc non Af Amer: 44 mL/min — ABNORMAL LOW (ref >60)
Glucose, Bld: 79 mg/dL (ref 70–99)
Potassium: 2.8 mmol/L — ABNORMAL LOW (ref 3.5–5.1)
Sodium: 142 mmol/L (ref 135–145)
Total Bilirubin: 1 mg/dL (ref 0.3–1.2)
Total Protein: 5.5 g/dL — ABNORMAL LOW (ref 6.5–8.1)

## 2019-08-23 LAB — MAGNESIUM: Magnesium: 2.1 mg/dL (ref 1.7–2.4)

## 2019-08-23 MED ORDER — POTASSIUM CHLORIDE 10 MEQ/100ML IV SOLN
10.0000 meq | INTRAVENOUS | Status: AC
  Administered 2019-08-23 (×5): 10 meq via INTRAVENOUS
  Filled 2019-08-23 (×4): qty 100

## 2019-08-23 MED ORDER — ENOXAPARIN SODIUM 40 MG/0.4ML ~~LOC~~ SOLN: 40.0000 mg | SUBCUTANEOUS | Status: DC

## 2019-08-23 MED ORDER — IOHEXOL 350 MG/ML SOLN
80.0000 mL | Freq: Once | INTRAVENOUS | Status: AC | PRN
  Administered 2019-08-23: 80 mL via INTRAVENOUS

## 2019-08-23 MED ORDER — ENOXAPARIN SODIUM 120 MG/0.8ML ~~LOC~~ SOLN
120.0000 mg | Freq: Two times a day (BID) | SUBCUTANEOUS | Status: DC
  Filled 2019-08-23: qty 0.8

## 2019-08-23 MED ORDER — ENOXAPARIN SODIUM 40 MG/0.4ML ~~LOC~~ SOLN
40.0000 mg | SUBCUTANEOUS | Status: DC
  Filled 2019-08-23: qty 0.4

## 2019-08-23 MED ORDER — MENTHOL 3 MG MT LOZG: 1.0000 | LOZENGE | OROMUCOSAL | Status: DC | PRN

## 2019-08-23 NOTE — Progress Notes (Signed)
Pharmacy Renal Dosing Adjustment-->Lovenox  Drug: Lovenox  Original dose: 120mg  sub-q q24h Scr: 1.17mg /dL CrClest~ mg/dL New dose: Lovenox 120mg  sub-q q12h  Thank You, 93.1, Pharm. D. Clinical Pharmacist 08/23/2019 12:16 PM

## 2019-08-23 NOTE — Progress Notes (Signed)
PROGRESS NOTE    Dawn Gonzales  WJX:[id number] DOB: Sep 06, 1940 DOA: 08/20/2019 PCP: Benita Stabile, MD   Brief Narrative:  Per HPI: Dawn Gonzales a 79 y.o.femalewith medical history significant forcomplete heart block with pacemaker,diastolic CHF, morbid obesity, aortic stenosis status post TAVR, RBBB. EMS was called out for generalized weakness, and low back pain. Patient was found to be hypoxic with O2 sats 80% on room air. Patient is not on home oxygen.  Patient denies difficulty breathing, has mild chronic cough that is unchanged, no wheezing. Reports some mild occasional sharp chest pains on the left side of her chest, but she has not had any chest pains today. She is unaware if she has had any leg swelling or redness, asshehas not looked ather legs. Today she has had shoulder pains. She has chronic arthritis back pain, that is unchanged from prior,For which she takes Tylenol. Denies falls. She lives alone, but her daughter comes in to check on her. She ambulates with a wheelchair. She tells me she has had multiple episodes of vomiting every day over the past few days, she has not had any vomiting today. She also reports poor p.o. intake. Last bowel movement was a few days ago,she is unsure of exactly when. She denies abdominal pain. She denies urinary frequency or pain with urination. She denies bloody stools vomiting of blood or black stools since discharge.  4/22:Patient has been admitted with concern for gallstone ileus and has associated AKI as well as acute hypoxemic respiratory failure. There was some question of possible PE and now she has an indeterminate VQ scan. She remains on full dose Lovenox at the moment and has worsening AKI and will require greater amounts of IV fluid since she still has significant NG tube output noted. LVEF noted to be 60 to 65% on 07/06/2019. Continue to monitor labs. General surgery evaluation with plans for  conservative management at this point using NG tube.  4/23: Patient continues to have high amounts of NG tube output with no bowel movement noted.  Her renal function is improving on aggressive IV fluid hydration.  Continue monitor a.m. labs.  4/24: Patient continues to have high amounts of output via NG tube, but did have bowel movement last night.  Plan to continue NG tube to suction due to high output per general surgery.  She is noted to have some bloody NG tube output, but this appears minimal.  Renal function has improved, and will check CT chest with contrast to ensure that there is no PE and hopefully, will be able to lower dose of her Lovenox.  Supplement potassium.  Assessment & Plan:   Principal Problem:   Hypoxia Active Problems:   Obesity, Class III, BMI 40-49.9 (morbid obesity) (HCC)   Sleep apnea   Hypertension   Chronic diastolic CHF (congestive heart failure), NYHA class 2 (HCC)   Gallstone ileus (HCC)   Complete heart block (HCC)   History of cardiac pacemaker   Small bowel obstruction (HCC)   Generalized weakness likely secondary to dehydration/AKI in the setting of gallstone ileus -Continue overall hydration and conservative management as noted below -Mostly wheelchair-bound at home  Ileus -Possibly related to gallstones, but this remains uncertain -Continue NG tube decompression due to ongoing high output -Monitor electrolytes -Appreciate general surgery evaluation  AKI-improving -Prerenal and nonoliguric -Noted to have solid mass to right kidney on CT with renal ultrasound performed.  Patient is aware of this and should follow-up with MRI in the outpatient  setting. -Urine electrolytes evaluated -Aggressive IV fluid and monitor I's and O's -Avoid nephrotoxic agents and follow a.m. labs  Acute hypoxemic respiratory failure-multifactorial -Very questionable if this is actually a PE, I doubt this given the fact that she has had no significant chest pain,  cough, or even shortness of breath -Continue full dose Lovenox for now and obtain CT chest angiogram to hopefully rule out PE -May very well be related more to OHS/OSA and atelectasis -Continue incentive spirometry and wean oxygen as tolerated  Hypokalemia -Replete IV and recheck in a.m. along with magnesium  Prolonged QTC - 537 ms on EKG which is improved today -Continue to monitor a.m. potassium and magnesium   Chronic diastolic CHF with permanent pacemaker and TAVR -Last 2D echocardiogram 06/2019 with EF 60-65% -Okay for aggressive hydration especially with good urine output -Monitor carefully  OSA/morbid obesity -Likely source of hypoxemia, but will plan to rule out PE today -CPAP nightly  DVT prophylaxis:Full dose Lovenox Code Status:Full code Family Communication:Discussed with daughter on phone, [name] Disposition Plan:Continue conservative management with NG tube as well as aggressive IV fluid and evaluation of urine electrolytes pending.  Monitor a.m. labs.  Check chest CT today.   Consultants:  General Surgery  Procedures:  None  Antimicrobials:   None  Subjective: Patient seen and evaluated today with no new acute complaints or concerns. No acute concerns or events noted overnight.  She is eager to have NG tube out if possible.  Some blood-tinged fluid noted.  Positive bowel movement last night.  Objective: Vitals:   08/22/19 1932 08/22/19 2223 08/23/19 0242 08/23/19 0500  BP:  136/62 (!) 144/59 131/65  Pulse: 95 96 98 95  Resp: 16     Temp:  98.1 F (36.7 C) 99.9 F (37.7 C) 98.3 F (36.8 C)  TempSrc:  Oral Oral Oral  SpO2: 95% 94% 94% 92%  Weight:      Height:        Intake/Output Summary (Last 24 hours) at 08/23/2019 1043 Last data filed at 08/23/2019 0649 Gross per 24 hour  Intake --  Output 3625 ml  Net -3625 ml   Filed Weights   08/20/19 0910 08/20/19 2008  Weight: 125.6 kg 124.2 kg    Examination:  General  exam: Appears calm and comfortable, obese Respiratory system: Clear to auscultation. Respiratory effort normal.  On nasal cannula oxygen. Cardiovascular system: S1 & S2 heard, RRR. No JVD, murmurs, rubs, gallops or clicks. No pedal edema. Gastrointestinal system: Abdomen is nondistended, soft and nontender. No organomegaly or masses felt. Normal bowel sounds heard.  NG tube with ongoing high output noted. Central nervous system: Alert and oriented. No focal neurological deficits. Extremities: Symmetric 5 x 5 power. Skin: No rashes, lesions or ulcers Psychiatry: Judgement and insight appear normal. Mood & affect appropriate.     Data Reviewed: I have personally reviewed following labs and imaging studies  CBC: Recent Labs  Lab 08/20/19 1046 08/21/19 0542 08/22/19 0512  WBC 16.7* 15.2* 9.6  NEUTROABS 14.1*  --   --   HGB 15.5* 13.5 12.3  HCT 48.3* 43.5 40.2  MCV 93.8 95.8 98.3  PLT 341 285 235   Basic Metabolic Panel: Recent Labs  Lab 08/20/19 1046 08/21/19 0542 08/22/19 0512 08/23/19 0507  NA 137 138 140 142  K 3.8 3.9 3.5 2.8*  CL 90* 96* 100 101  CO2 31 29 27 29   GLUCOSE 133* 124* 88 79  BUN 45* 62* 70* 52*  CREATININE 2.23*  2.85* 2.18* 1.17*  CALCIUM 9.7 8.5* 8.2* 8.4*  MG 2.1  --  2.1 2.1   GFR: Estimated Creatinine Clearance: 52.5 mL/min (A) (by C-G formula based on SCr of 1.17 mg/dL (H)). Liver Function Tests: Recent Labs  Lab 08/20/19 1046 08/21/19 0542 08/22/19 0512 08/23/19 0507  AST 34 24 18 16   ALT 22 20 17 15   ALKPHOS 75 62 56 48  BILITOT 1.1 0.8 0.7 1.0  PROT 7.6 5.9* 5.6* 5.5*  ALBUMIN 3.7 2.8* 2.5* 2.3*   Recent Labs  Lab 08/20/19 1046  LIPASE 29   No results for input(s): AMMONIA in the last 168 hours. Coagulation Profile: Recent Labs  Lab 08/20/19 1046  INR 1.1   Cardiac Enzymes: No results for input(s): CKTOTAL, CKMB, CKMBINDEX, TROPONINI in the last 168 hours. BNP (last 3 results) No results for input(s): PROBNP in the last  8760 hours. HbA1C: No results for input(s): HGBA1C in the last 72 hours. CBG: No results for input(s): GLUCAP in the last 168 hours. Lipid Profile: No results for input(s): CHOL, HDL, LDLCALC, TRIG, CHOLHDL, LDLDIRECT in the last 72 hours. Thyroid Function Tests: No results for input(s): TSH, T4TOTAL, FREET4, T3FREE, THYROIDAB in the last 72 hours. Anemia Panel: No results for input(s): VITAMINB12, FOLATE, FERRITIN, TIBC, IRON, RETICCTPCT in the last 72 hours. Sepsis Labs: Recent Labs  Lab 08/20/19 1047 08/20/19 1248  LATICACIDVEN 1.5 1.3    Recent Results (from the past 240 hour(s))  Respiratory Panel by RT PCR (Flu A&B, Covid) - Nasopharyngeal Swab     Status: None   Collection Time: 08/20/19  9:56 AM   Specimen: Nasopharyngeal Swab  Result Value Ref Range Status   SARS Coronavirus 2 by RT PCR NEGATIVE NEGATIVE Final    Comment: (NOTE) SARS-CoV-2 target nucleic acids are NOT DETECTED. The SARS-CoV-2 RNA is generally detectable in upper respiratoy specimens during the acute phase of infection. The lowest concentration of SARS-CoV-2 viral copies this assay can detect is 131 copies/mL. A negative result does not preclude SARS-Cov-2 infection and should not be used as the sole basis for treatment or other patient management decisions. A negative result may occur with  improper specimen collection/handling, submission of specimen other than nasopharyngeal swab, presence of viral mutation(s) within the areas targeted by this assay, and inadequate number of viral copies (<131 copies/mL). A negative result must be combined with clinical observations, patient history, and epidemiological information. The expected result is Negative. Fact Sheet for Patients:  https://www.moore.com/https://www.fda.gov/media/142436/download Fact Sheet for Healthcare Providers:  https://www.young.biz/https://www.fda.gov/media/142435/download This test is not yet ap proved or cleared by the [place] FDA and  has been authorized for  detection and/or diagnosis of SARS-CoV-2 by FDA under an Emergency Use Authorization (EUA). This EUA will remain  in effect (meaning this test can be used) for the duration of the COVID-19 declaration under Section 564(b)(1) of the Act, 21 U.S.C. section 360bbb-3(b)(1), unless the authorization is terminated or revoked sooner.    Influenza A by PCR NEGATIVE NEGATIVE Final   Influenza B by PCR NEGATIVE NEGATIVE Final    Comment: (NOTE) The Xpert Xpress SARS-CoV-2/FLU/RSV assay is intended as an aid in  the diagnosis of influenza from Nasopharyngeal swab specimens and  should not be used as a sole basis for treatment. Nasal washings and  aspirates are unacceptable for Xpert Xpress SARS-CoV-2/FLU/RSV  testing. Fact Sheet for Patients: https://www.moore.com/https://www.fda.gov/media/142436/download Fact Sheet for Healthcare Providers: https://www.young.biz/https://www.fda.gov/media/142435/download This test is not yet approved or cleared by the [place] FDA and  has  been authorized for detection and/or diagnosis of SARS-CoV-2 by  FDA under an Emergency Use Authorization (EUA). This EUA will remain  in effect (meaning this test can be used) for the duration of the  Covid-19 declaration under Section 564(b)(1) of the Act, 21  U.S.C. section 360bbb-3(b)(1), unless the authorization is  terminated or revoked. Performed at [facility], [address] [place], [place] [address]   Blood Culture (routine x 2)     Status: None (Preliminary result)   Collection Time: 08/20/19 10:47 AM   Specimen: BLOOD LEFT HAND  Result Value Ref Range Status   Specimen Description BLOOD LEFT HAND DRAWN BY RN  Final   Special Requests   Final    BOTTLES DRAWN AEROBIC AND ANAEROBIC Blood Culture adequate volume   Culture   Final    NO GROWTH 3 DAYS Performed at [facility], [address] [place], [place] [address]    Report Status PENDING  Incomplete  Blood Culture (routine x 2)     Status: None (Preliminary result)   Collection Time:  08/20/19 10:48 AM   Specimen: BLOOD RIGHT HAND  Result Value Ref Range Status   Specimen Description BLOOD RIGHT HAND DRAWN BY RN  Final   Special Requests   Final    BOTTLES DRAWN AEROBIC AND ANAEROBIC Blood Culture results may not be optimal due to an inadequate volume of blood received in culture bottles   Culture   Final    NO GROWTH 3 DAYS Performed at [facility], [address] [place], [place] [address]    Report Status PENDING  Incomplete  Urine culture     Status: Abnormal   Collection Time: 08/20/19  1:49 PM   Specimen: Urine, Catheterized  Result Value Ref Range Status   Specimen Description   Final    URINE, CATHETERIZED Performed at [facility], [address], [place], [place] [address]    Special Requests   Final    NONE Performed at [facility], [address], [place], [place] [address]    Culture (A)  Final    >=100,000 COLONIES/mL LACTOBACILLUS SPECIES Standardized susceptibility testing for this organism is not available. Performed at [facility], [place] [address], [place], [place] [address]    Report Status 08/22/2019 FINAL  Final         Radiology Studies: US RENAL  Result Date: 08/21/2019 CLINICAL DATA:  Acute kidney injury. EXAM: RENAL / URINARY TRACT ULTRASOUND COMPLETE COMPARISON:  None. FINDINGS: Right Kidney: Renal measurements: 9.1 x 5.8 x 5.2 cm = volume: 144 mL . Echogenicity within normal limits. No mass or hydronephrosis visualized. Left Kidney: Not visualized. Bladder: Not seen, presumably decompressed. Other: None. IMPRESSION: 1. Visualized portion of the RIGHT kidney appears normal. Earlier CT abdomen of 08/16/2019 described a solid/cystic appearing lesion arising from the lower pole of the RIGHT kidney. This mass is not able to be visualized by ultrasound, presumably obscured by overlying bowel gas. Consider MRI for definitive characterization. When the patient is clinically stable and able to follow directions and hold their breath  (preferably as an outpatient) further evaluation with dedicated abdominal MRI should be considered. 2. LEFT kidney is not able to be visualized by ultrasound, presumably obscured by overlying bowel gas. 3. No acute appearing renal findings.  No hydronephrosis. 4. Bladder is not seen, presumably decompressed. Electronically Signed   By: Franki Cabot M.D.   On: 08/21/2019 14:25        Scheduled Meds: . bisacodyl  10 mg Rectal BID  . enoxaparin (LOVENOX) injection  120 mg Subcutaneous Q24H  . pantoprazole (PROTONIX) IV  40 mg Intravenous Q24H   Continuous Infusions: . lactated ringers 150 mL/hr at 08/23/19 0642  . potassium chloride 10 mEq (08/23/19 1026)     LOS: 3 days    Time spent: 35 minutes    Myleigh Amara Hoover Brunette, DO [facility]  If 7PM-7AM, please contact night-coverage [email or web address] 08/23/2019, 10:43 AM

## 2019-08-23 NOTE — Progress Notes (Signed)
Subjective: Complains of throat pain from her NG tube.  Denies any abdominal pain.  Objective: Vital signs in last 24 hours: Temp:  [98.1 F (36.7 C)-99.9 F (37.7 C)] 98.3 F (36.8 C) (04/24 0500) Pulse Rate:  [91-98] 95 (04/24 0500) Resp:  [16-17] 16 (04/23 1932) BP: (131-157)/(59-79) 131/65 (04/24 0500) SpO2:  [92 %-97 %] 92 % (04/24 0500) Last BM Date: 08/22/19  Intake/Output from previous day: 04/23 0701 - 04/24 0700 In: -  Out: 4625 [Urine:1800; Emesis/NG output:2825] Intake/Output this shift: No intake/output data recorded.  General appearance: alert, cooperative and no distress GI: soft, non-tender; bowel sounds normal; no masses,  no organomegaly  Lab Results:  Recent Labs    08/21/19 0542 08/22/19 0512  WBC 15.2* 9.6  HGB 13.5 12.3  HCT 43.5 40.2  PLT 285 235   BMET Recent Labs    08/22/19 0512 08/23/19 0507  NA 140 142  K 3.5 2.8*  CL 100 101  CO2 27 29  GLUCOSE 88 79  BUN 70* 52*  CREATININE 2.18* 1.17*  CALCIUM 8.2* 8.4*   PT/INR Recent Labs    08/20/19 1046  LABPROT 14.1  INR 1.1    Studies/Results: US RENAL  Result Date: 08/21/2019 CLINICAL DATA:  Acute kidney injury. EXAM: RENAL / URINARY TRACT ULTRASOUND COMPLETE COMPARISON:  None. FINDINGS: Right Kidney: Renal measurements: 9.1 x 5.8 x 5.2 cm = volume: 144 mL . Echogenicity within normal limits. No mass or hydronephrosis visualized. Left Kidney: Not visualized. Bladder: Not seen, presumably decompressed. Other: None. IMPRESSION: 1. Visualized portion of the RIGHT kidney appears normal. Earlier CT abdomen of 08/16/2019 described a solid/cystic appearing lesion arising from the lower pole of the RIGHT kidney. This mass is not able to be visualized by ultrasound, presumably obscured by overlying bowel gas. Consider MRI for definitive characterization. When the patient is clinically stable and able to follow directions and hold their breath (preferably as an outpatient) further evaluation  with dedicated abdominal MRI should be considered. 2. LEFT kidney is not able to be visualized by ultrasound, presumably obscured by overlying bowel gas. 3. No acute appearing renal findings.  No hydronephrosis. 4. Bladder is not seen, presumably decompressed. Electronically Signed   By: Franki Cabot M.D.   On: 08/21/2019 14:25   DG Abd Portable 1V  Result Date: 08/21/2019 CLINICAL DATA:  NG tube placement. Small bowel obstruction. EXAM: PORTABLE ABDOMEN - 1 VIEW COMPARISON:  CT scan of the abdomen dated 08/20/2019 FINDINGS: NG tube tip appears to be distal body of the stomach. Persistent dilated small bowel. The colon is not distended. Air in the biliary tree as noted on the prior CT scan. No acute bone abnormality. IMPRESSION: NG tube tip appears to be in the distal body of the stomach. Persistent small bowel obstruction. Electronically Signed   By: Lorriane Shire M.D.   On: 08/21/2019 10:50    Anti-infectives: Anti-infectives (From admission, onward)   Start     Dose/Rate Route Frequency Ordered Stop   08/22/19 1100  vancomycin (VANCOREADY) IVPB 1750 mg/350 mL  Status:  Discontinued     1,750 mg 175 mL/hr over 120 Minutes Intravenous Every 48 hours 08/20/19 1550 08/20/19 1947   08/21/19 1000  ceFEPIme (MAXIPIME) 2 g in sodium chloride 0.9 % 100 mL IVPB  Status:  Discontinued     2 g 200 mL/hr over 30 Minutes Intravenous Every 24 hours 08/20/19 1550 08/20/19 1947   08/20/19 1100  vancomycin (VANCOREADY) IVPB 2000 mg/400 mL  2,000 mg 200 mL/hr over 120 Minutes Intravenous  Once 08/20/19 1007 08/20/19 1332   08/20/19 1000  ceFEPIme (MAXIPIME) 2 g in sodium chloride 0.9 % 100 mL IVPB     2 g 200 mL/hr over 30 Minutes Intravenous  Once 08/20/19 0954 08/20/19 1342   08/20/19 1000  metroNIDAZOLE (FLAGYL) IVPB 500 mg     500 mg 100 mL/hr over 60 Minutes Intravenous  Once 08/20/19 0954 08/20/19 1332   08/20/19 1000  vancomycin (VANCOCIN) IVPB 1000 mg/200 mL premix  Status:  Discontinued      1,000 mg 200 mL/hr over 60 Minutes Intravenous  Once 08/20/19 0954 08/20/19 1007      Assessment/Plan: Impression: Partial small bowel obstruction, slowly resolving.  NG tube output still significantly elevated.  Would like to see that decreased prior to removing NG tube.  This was discussed with the patient.  Will reevaluate in a.m.  No need for acute surgical intervention at this time.  LOS: 3 days    Franky Macho 08/23/2019

## 2019-08-24 ENCOUNTER — Inpatient Hospital Stay (HOSPITAL_COMMUNITY): Payer: Medicare PPO

## 2019-08-24 LAB — CBC
HCT: 38.9 % (ref 36.0–46.0)
Hemoglobin: 11.9 g/dL — ABNORMAL LOW (ref 12.0–15.0)
MCH: 29.5 pg (ref 26.0–34.0)
MCHC: 30.6 g/dL (ref 30.0–36.0)
MCV: 96.3 fL (ref 80.0–100.0)
Platelets: 241 10*3/uL (ref 150–400)
RBC: 4.04 MIL/uL (ref 3.87–5.11)
RDW: 15.3 % (ref 11.5–15.5)
WBC: 9.7 10*3/uL (ref 4.0–10.5)
nRBC: 0 % (ref 0.0–0.2)

## 2019-08-24 LAB — BASIC METABOLIC PANEL
Anion gap: 13 (ref 5–15)
BUN: 35 mg/dL — ABNORMAL HIGH (ref 8–23)
CO2: 29 mmol/L (ref 22–32)
Calcium: 8.4 mg/dL — ABNORMAL LOW (ref 8.9–10.3)
Chloride: 102 mmol/L (ref 98–111)
Creatinine, Ser: 0.85 mg/dL (ref 0.44–1.00)
GFR calc Af Amer: >60 mL/min (ref >60)
GFR calc non Af Amer: >60 mL/min (ref >60)
Glucose, Bld: 86 mg/dL (ref 70–99)
Potassium: 3.6 mmol/L (ref 3.5–5.1)
Sodium: 144 mmol/L (ref 135–145)

## 2019-08-24 LAB — MAGNESIUM: Magnesium: 1.9 mg/dL (ref 1.7–2.4)

## 2019-08-24 MED ORDER — GABAPENTIN 300 MG PO CAPS
300.0000 mg | ORAL_CAPSULE | Freq: Three times a day (TID) | ORAL | Status: DC
  Administered 2019-08-24 – 2019-08-28 (×13): 300 mg via ORAL
  Filled 2019-08-24 (×12): qty 1

## 2019-08-24 MED ORDER — POTASSIUM CHLORIDE CRYS ER 20 MEQ PO TBCR
40.0000 meq | EXTENDED_RELEASE_TABLET | Freq: Once | ORAL | Status: AC
  Administered 2019-08-24: 40 meq via ORAL
  Filled 2019-08-24: qty 2

## 2019-08-24 MED ORDER — VENLAFAXINE HCL 37.5 MG PO TABS
37.5000 mg | ORAL_TABLET | Freq: Every day | ORAL | Status: DC
  Administered 2019-08-24 – 2019-08-28 (×5): 37.5 mg via ORAL
  Filled 2019-08-24 (×5): qty 1

## 2019-08-24 NOTE — Progress Notes (Signed)
Subjective: Patient denies abdominal pain.  NG tube in place this morning.  Objective: Vital signs in last 24 hours: Temp:  [99 F (37.2 C)-99.5 F (37.5 C)] 99 F (37.2 C) (04/25 0458) Pulse Rate:  [93-95] 95 (04/25 0458) Resp:  [18] 18 (04/25 0458) BP: (169-175)/(76-83) 169/83 (04/25 0458) SpO2:  [94 %-95 %] 95 % (04/25 0458) Last BM Date: 08/22/19  Intake/Output from previous day: 04/24 0701 - 04/25 0700 In: -  Out: 550 [Urine:350; Emesis/NG output:200] Intake/Output this shift: No intake/output data recorded.  General appearance: alert and no distress GI: soft, non-tender; bowel sounds normal; no masses,  no organomegaly  Lab Results:  Recent Labs    08/22/19 0512 08/24/19 0741  WBC 9.6 9.7  HGB 12.3 11.9*  HCT 40.2 38.9  PLT 235 241   BMET Recent Labs    08/23/19 0507 08/24/19 0741  NA 142 144  K 2.8* 3.6  CL 101 102  CO2 29 29  GLUCOSE 79 86  BUN 52* 35*  CREATININE 1.17* 0.85  CALCIUM 8.4* 8.4*   PT/INR No results for input(s): LABPROT, INR in the last 72 hours.  Studies/Results: CT ANGIO CHEST PE W OR WO CONTRAST  Result Date: 08/23/2019 CLINICAL DATA:  Indeterminate V/Q scan. Evaluate for pulmonary embolism. EXAM: CT ANGIOGRAPHY CHEST WITH CONTRAST TECHNIQUE: Multidetector CT imaging of the chest was performed using the standard protocol during bolus administration of intravenous contrast. Multiplanar CT image reconstructions and MIPs were obtained to evaluate the vascular anatomy. CONTRAST:  18mL OMNIPAQUE IOHEXOL 350 MG/ML SOLN COMPARISON:  Nuclear medicine V/Q scan-08/20/2019; chest CT-08/20/2019; 06/03/2018; CT abdomen pelvis-08/20/2019 FINDINGS: Vascular Findings: There is adequate opacification of the pulmonary arterial system with the main pulmonary artery measuring 246 Hounsfield units. There are no discrete filling defects within the pulmonary arterial tree to suggest pulmonary embolism. Enlarged caliber of the main pulmonary artery  measuring 3.9 cm in diameter (image 37, series 4). Cardiomegaly. Pacer leads terminate within the right atrium and ventricle. Calcifications about the mitral valve annulus. Post percutaneous aortic valve replacement. 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 Review of the MIP images confirms the above findings. ---------------------------------------------------------------------------------- Nonvascular Findings: Mediastinum/Lymph Nodes: No bulky mediastinal, hilar axillary lymphadenopathy. Lungs/Pleura: Worsening left basilar atelectasis/collapse. Ill-defined areas of ground-glass within the bilateral upper lobes is favored to represent areas of air trapping. Potential trace left-sided pleural effusion. No pneumothorax. Central pulmonary airways appear patent. Upper abdomen: Limited early arterial phase evaluation of the upper abdomen demonstrates nasogastric tube within the gastric fundus, tip excluded from view. 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 Musculoskeletal: No acute or aggressive osseous abnormalities. Severe degenerative change of the bilateral glenohumeral joints, right greater than left. Stigmata of dish within the thoracic spine. IMPRESSION: 1. No evidence of pulmonary embolism. 2. Worsening left lower lobe atelectasis/collapse. 3. Cardiomegaly with ill-defined areas of ground-glass within the bilateral upper lobes, potentially air trapping though early/mild pulmonary edema could have a similar appearance. Clinical correlation is advised. 4. 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 Recommend annual imaging followup by CTA or MRA. This recommendation follows 2010 ACCF/AHA/AATS/ACR/ASA/SCA/SCAI/SIR/STS/SVM Guidelines for the  Diagnosis and Management of Patients with Thoracic Aortic Disease. Circulation. 2010; 121: N462-V035. Aortic aneurysm NOS (ICD10-I71.9) 5. Otherwise, stable ancillary findings as detailed above. Electronically Signed   By: Jenny Reichmann  Watts M.D.   On: 08/23/2019 14:08    Anti-infectives: Anti-infectives (From admission, onward)   Start     Dose/Rate Route Frequency Ordered Stop   08/22/19 1100  vancomycin (VANCOREADY) IVPB 1750 mg/350 mL  Status:  Discontinued     1,750 mg 175 mL/hr over 120 Minutes Intravenous Every 48 hours 08/20/19 1550 08/20/19 1947   08/21/19 1000  ceFEPIme (MAXIPIME) 2 g in sodium chloride 0.9 % 100 mL IVPB  Status:  Discontinued     2 g 200 mL/hr over 30 Minutes Intravenous Every 24 hours 08/20/19 1550 08/20/19 1947   08/20/19 1100  vancomycin (VANCOREADY) IVPB 2000 mg/400 mL     2,000 mg 200 mL/hr over 120 Minutes Intravenous  Once 08/20/19 1007 08/20/19 1332   08/20/19 1000  ceFEPIme (MAXIPIME) 2 g in sodium chloride 0.9 % 100 mL IVPB     2 g 200 mL/hr over 30 Minutes Intravenous  Once 08/20/19 0954 08/20/19 1342   08/20/19 1000  metroNIDAZOLE (FLAGYL) IVPB 500 mg     500 mg 100 mL/hr over 60 Minutes Intravenous  Once 08/20/19 0954 08/20/19 1332   08/20/19 1000  vancomycin (VANCOCIN) IVPB 1000 mg/200 mL premix  Status:  Discontinued     1,000 mg 200 mL/hr over 60 Minutes Intravenous  Once 08/20/19 0954 08/20/19 1007      Assessment/Plan: Impression: Partial small bowel obstruction versus ileus.  Clinically, bowel function seems to be returning.  Patient has been pulling her NG tube out.  Have removed NG tube and will advance to full liquid diet.  No need for acute surgical invention at this time.  LOS: 4 days    Franky Macho 08/24/2019

## 2019-08-24 NOTE — Progress Notes (Signed)
Patient continues to decline the use of a CPAP and will continue to wear her Burnett during her time here in the hospital.

## 2019-08-24 NOTE — Progress Notes (Signed)
PROGRESS NOTE    Dawn Gonzales  HAL:[id number] DOB: 04-04-1941 DOA: 08/20/2019 PCP: Benita Stabile, MD   Brief Narrative:  Per HPI: Dawn Couzens [place] a 79 y.o.femalewith medical history significant forcomplete heart block with pacemaker,diastolic CHF, morbid obesity, aortic stenosis status post TAVR, RBBB. EMS was called out for generalized weakness, and low back pain. Patient was found to be hypoxic with O2 sats 80% on room air. Patient is not on home oxygen.  Patient denies difficulty breathing, has mild chronic cough that is unchanged, no wheezing. Reports some mild occasional sharp chest pains on the left side of her chest, but she has not had any chest pains today. She is unaware if she has had any leg swelling or redness, asshehas not looked ather legs. Today she has had shoulder pains. She has chronic arthritis back pain, that is unchanged from prior,For which she takes Tylenol. Denies falls. She lives alone, but her daughter comes in to check on her. She ambulates with a wheelchair. She tells me she has had multiple episodes of vomiting every day over the past few days, she has not had any vomiting today. She also reports poor p.o. intake. Last bowel movement was a few days ago,she is unsure of exactly when. She denies abdominal pain. She denies urinary frequency or pain with urination. She denies bloody stools vomiting of blood or black stools since discharge.  4/22:Patient has been admitted with concern for gallstone ileus and has associated AKI as well as acute hypoxemic respiratory failure. There was some question of possible PE and now she has an indeterminate VQ scan. She remains on full dose Lovenox at the moment and has worsening AKI and will require greater amounts of IV fluid since she still has significant NG tube output noted. LVEF noted to be 60 to 65% on 07/06/2019. Continue to monitor labs. General surgery evaluation with plans for  conservative management at this point using NG tube.  4/23: Patient continues to have high amounts of NG tube output with no bowel movement noted. Her renal function is improving on aggressive IV fluid hydration. Continue monitor a.m. labs.  4/24: Patient continues to have high amounts of output via NG tube, but did have bowel movement last night.  Plan to continue NG tube to suction due to high output per general surgery.  She is noted to have some bloody NG tube output, but this appears minimal.  Renal function has improved, and will check CT chest with contrast to ensure that there is no PE and hopefully, will be able to lower dose of her Lovenox.  Supplement potassium.  4/25: Patient removed NG tube last night and this was replaced this morning by nursing staff.  Seen by general surgery and NG tube was removed this morning.  She has been started on a full liquid diet.  CT chest with contrast on 4/24 with no signs of PE noted.  Lovenox has therefore been held and she has been placed on SCDs given bloody NG tube output.  Continue to follow a.m. labs.  Assessment & Plan:   Principal Problem:   Hypoxia Active Problems:   Obesity, Class III, BMI 40-49.9 (morbid obesity) (HCC)   Sleep apnea   Hypertension   Chronic diastolic CHF (congestive heart failure), NYHA class 2 (HCC)   Gallstone ileus (HCC)   Complete heart block (HCC)   History of cardiac pacemaker   Small bowel obstruction (HCC)   Generalized weakness likely secondary to dehydration/AKI in the setting  of gallstone ileus -Continue overall hydration and conservative management as noted below -Mostly wheelchair-bound at home  Ileus-resolving -Possibly related to gallstones, but this remains uncertain -NG tube discontinued today 4/25 -Monitor electrolytes -Appreciate general surgery evaluation, with full diet initiated  AKI-resolved -Prerenal and nonoliguric -Noted to have solid mass to right kidney on CT with renal  ultrasoundperformed. Patient is aware of this and should follow-up with MRI in the outpatient setting. -Urine electrolytes evaluated -Aggressive IV fluid and monitor I's and O's -Avoid nephrotoxic agents and follow a.m. labs -Discontinue IV fluid and recheck a.m. renal panel  Acute hypoxemic respiratory failure-multifactorial -CT chest study is negative and Lovenox has been discontinued -May very well be related more to OHS/OSA and atelectasis -Continue incentive spirometry and wean oxygen as tolerated  Hypokalemia-resolved -Recheck in a.m.  Worsening anemia -Dilutional versus related to NG tube trauma -Lovenox has been currently held and patient has been switched to SCDs -Monitor a.m. CBC  Prolonged QTC -537 ms on EKG which is improved today -Continue to monitor a.m. potassium and magnesium   Chronic diastolic CHF with permanent pacemaker and TAVR -Last 2D echocardiogram 06/2019 with EF 60-65% -Monitor carefully  OSA/morbid obesity -Likely source of hypoxemia, but will plan to rule out PE today -CPAP nightly  DVT prophylaxis:Full dose Lovenox switched to SCDs Code Status:Full code Family Communication:Discussed with daughter on phone, Ella BodoSarah Cavanaugh Disposition Plan:NG tube discontinued and patient started on full liquids.  Continue to monitor repeat a.m. labs.  If tolerating diet, may consider discharge to home in the next 24-48 hours.   Consultants:  General Surgery  Procedures:  None  Antimicrobials:   None  Subjective: Patient seen and evaluated today with no new acute complaints or concerns.  She did remove her NG tube last night and this has been replaced this morning.  Objective: Vitals:   08/23/19 0242 08/23/19 0500 08/23/19 2204 08/24/19 0458  BP: (!) 144/59 131/65 (!) 175/76 (!) 169/83  Pulse: 98 95 93 95  Resp:   18 18  Temp: 99.9 F (37.7 C) 98.3 F (36.8 C) 99.5 F (37.5 C) 99 F (37.2 C)  TempSrc: Oral Oral Oral Oral   SpO2: 94% 92% 94% 95%  Weight:      Height:        Intake/Output Summary (Last 24 hours) at 08/24/2019 1152 Last data filed at 08/24/2019 0900 Gross per 24 hour  Intake 0 ml  Output 550 ml  Net -550 ml   Filed Weights   08/20/19 0910 08/20/19 2008  Weight: 125.6 kg 124.2 kg    Examination:  General exam: Appears calm and comfortable, obese Respiratory system: Clear to auscultation. Respiratory effort normal.  On nasal cannula oxygen. Cardiovascular system: S1 & S2 heard, RRR. No JVD, murmurs, rubs, gallops or clicks. No pedal edema. Gastrointestinal system: Abdomen is nondistended, soft and nontender. No organomegaly or masses felt. Normal bowel sounds heard.  NG tube with bloody output. Central nervous system: Alert and oriented. No focal neurological deficits. Extremities: Symmetric 5 x 5 power. Skin: No rashes, lesions or ulcers Psychiatry: Judgement and insight appear normal. Mood & affect appropriate.     Data Reviewed: I have personally reviewed following labs and imaging studies  CBC: Recent Labs  Lab 08/20/19 1046 08/21/19 0542 08/22/19 0512 08/24/19 0741  WBC 16.7* 15.2* 9.6 9.7  NEUTROABS 14.1*  --   --   --   HGB 15.5* 13.5 12.3 11.9*  HCT 48.3* 43.5 40.2 38.9  MCV 93.8 95.8 98.3 96.3  PLT 341 285 235 241   Basic Metabolic Panel: Recent Labs  Lab 08/20/19 1046 08/21/19 0542 08/22/19 0512 08/23/19 0507 08/24/19 0741  NA 137 138 140 142 144  K 3.8 3.9 3.5 2.8* 3.6  CL 90* 96* 100 101 102  CO2 31 29 27 29 29   GLUCOSE 133* 124* 88 79 86  BUN 45* 62* 70* 52* 35*  CREATININE 2.23* 2.85* 2.18* 1.17* 0.85  CALCIUM 9.7 8.5* 8.2* 8.4* 8.4*  MG 2.1  --  2.1 2.1 1.9   GFR: Estimated Creatinine Clearance: 72.3 mL/min (by C-G formula based on SCr of 0.85 mg/dL). Liver Function Tests: Recent Labs  Lab 08/20/19 1046 08/21/19 0542 08/22/19 0512 08/23/19 0507  AST 34 24 18 16   ALT 22 20 17 15   ALKPHOS 75 62 56 48  BILITOT 1.1 0.8 0.7 1.0  PROT 7.6  5.9* 5.6* 5.5*  ALBUMIN 3.7 2.8* 2.5* 2.3*   Recent Labs  Lab 08/20/19 1046  LIPASE 29   No results for input(s): AMMONIA in the last 168 hours. Coagulation Profile: Recent Labs  Lab 08/20/19 1046  INR 1.1   Cardiac Enzymes: No results for input(s): CKTOTAL, CKMB, CKMBINDEX, TROPONINI in the last 168 hours. BNP (last 3 results) No results for input(s): PROBNP in the last 8760 hours. HbA1C: No results for input(s): HGBA1C in the last 72 hours. CBG: No results for input(s): GLUCAP in the last 168 hours. Lipid Profile: No results for input(s): CHOL, HDL, LDLCALC, TRIG, CHOLHDL, LDLDIRECT in the last 72 hours. Thyroid Function Tests: No results for input(s): TSH, T4TOTAL, FREET4, T3FREE, THYROIDAB in the last 72 hours. Anemia Panel: No results for input(s): VITAMINB12, FOLATE, FERRITIN, TIBC, IRON, RETICCTPCT in the last 72 hours. Sepsis Labs: Recent Labs  Lab 08/20/19 1047 08/20/19 1248  LATICACIDVEN 1.5 1.3    Recent Results (from the past 240 hour(s))  Respiratory Panel by RT PCR (Flu A&B, Covid) - Nasopharyngeal Swab     Status: None   Collection Time: 08/20/19  9:56 AM   Specimen: Nasopharyngeal Swab  Result Value Ref Range Status   SARS Coronavirus 2 by RT PCR NEGATIVE NEGATIVE Final    Comment: (NOTE) SARS-CoV-2 target nucleic acids are NOT DETECTED. The SARS-CoV-2 RNA is generally detectable in upper respiratoy specimens during the acute phase of infection. The lowest concentration of SARS-CoV-2 viral copies this assay can detect is 131 copies/mL. A negative result does not preclude SARS-Cov-2 infection and should not be used as the sole basis for treatment or other patient management decisions. A negative result may occur with  improper specimen collection/handling, submission of specimen other than nasopharyngeal swab, presence of viral mutation(s) within the areas targeted by this assay, and inadequate number of viral copies (<131 copies/mL). A negative  result must be combined with clinical observations, patient history, and epidemiological information. The expected result is Negative. Fact Sheet for Patients:  08/22/19 Fact Sheet for Healthcare Providers:  08/22/19 This test is not yet ap proved or cleared by the 08/22/19 FDA and  has been authorized for detection and/or diagnosis of SARS-CoV-2 by FDA under an Emergency Use Authorization (EUA). This EUA will remain  in effect (meaning this test can be used) for the duration of the COVID-19 declaration under Section 564(b)(1) of the Act, 21 U.S.C. section 360bbb-3(b)(1), unless the authorization is terminated or revoked sooner.    Influenza A by PCR NEGATIVE NEGATIVE Final   Influenza B by PCR NEGATIVE NEGATIVE Final    Comment: (NOTE) The Xpert  Xpress SARS-CoV-2/FLU/RSV assay is intended as an aid in  the diagnosis of influenza from Nasopharyngeal swab specimens and  should not be used as a sole basis for treatment. Nasal washings and  aspirates are unacceptable for Xpert Xpress SARS-CoV-2/FLU/RSV  testing. Fact Sheet for Patients: https://www.moore.com/ Fact Sheet for Healthcare Providers: https://www.young.biz/ This test is not yet approved or cleared by the [place] FDA and  has been authorized for detection and/or diagnosis of SARS-CoV-2 by  FDA under an Emergency Use Authorization (EUA). This EUA will remain  in effect (meaning this test can be used) for the duration of the  Covid-19 declaration under Section 564(b)(1) of the Act, 21  U.S.C. section 360bbb-3(b)(1), unless the authorization is  terminated or revoked. Performed at [facility], [address] [place], [place] [address]   Blood Culture (routine x 2)     Status: None (Preliminary result)   Collection Time: 08/20/19 10:47 AM   Specimen: BLOOD LEFT HAND  Result Value Ref Range Status   Specimen  Description BLOOD LEFT HAND DRAWN BY RN  Final   Special Requests   Final    BOTTLES DRAWN AEROBIC AND ANAEROBIC Blood Culture adequate volume   Culture   Final    NO GROWTH 4 DAYS Performed at [facility], [address], [place], [place] [address]    Report Status PENDING  Incomplete  Blood Culture (routine x 2)     Status: None (Preliminary result)   Collection Time: 08/20/19 10:48 AM   Specimen: BLOOD RIGHT HAND  Result Value Ref Range Status   Specimen Description BLOOD RIGHT HAND DRAWN BY RN  Final   Special Requests   Final    BOTTLES DRAWN AEROBIC AND ANAEROBIC Blood Culture results may not be optimal due to an inadequate volume of blood received in culture bottles   Culture   Final    NO GROWTH 4 DAYS Performed at [facility], [address], [place], [place] [address]    Report Status PENDING  Incomplete  Urine culture     Status: Abnormal   Collection Time: 08/20/19  1:49 PM   Specimen: Urine, Catheterized  Result Value Ref Range Status   Specimen Description   Final    URINE, CATHETERIZED Performed at [facility], [address] [place], [place] [address]    Special Requests   Final    NONE Performed at [facility], [address], [place], [place] [address]    Culture (A)  Final    >=100,000 COLONIES/mL LACTOBACILLUS SPECIES Standardized susceptibility testing for this organism is not available. Performed at [facility], [address], [place], [place] [address]    Report Status 08/22/2019 FINAL  Final         Radiology Studies: CT ANGIO CHEST PE W OR WO CONTRAST  Result Date: 08/23/2019 CLINICAL DATA:  Indeterminate V/Q scan. Evaluate for pulmonary embolism. EXAM: CT ANGIOGRAPHY CHEST WITH CONTRAST TECHNIQUE: Multidetector CT imaging of the chest was performed using the standard protocol during bolus administration of intravenous contrast. Multiplanar CT image reconstructions and MIPs were obtained to evaluate the vascular anatomy. CONTRAST:  73mL  OMNIPAQUE IOHEXOL 350 MG/ML SOLN COMPARISON:  Nuclear medicine V/Q scan-08/20/2019; chest CT-08/20/2019; 06/03/2018; CT abdomen pelvis-08/20/2019 FINDINGS: Vascular Findings: There is adequate opacification of the pulmonary arterial system with the main pulmonary artery measuring 246 Hounsfield units. There are no discrete filling defects within the pulmonary arterial tree to suggest pulmonary embolism. Enlarged caliber of the main pulmonary artery measuring  3.9 cm in diameter (image 37, series 4). Cardiomegaly. Pacer leads terminate within the right atrium and ventricle. Calcifications about the mitral valve annulus. Post percutaneous aortic valve replacement. 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 Review of the MIP images confirms the above findings. ---------------------------------------------------------------------------------- Nonvascular Findings: Mediastinum/Lymph Nodes: No bulky mediastinal, hilar axillary lymphadenopathy. Lungs/Pleura: Worsening left basilar atelectasis/collapse. Ill-defined areas of ground-glass within the bilateral upper lobes is favored to represent areas of air trapping. Potential trace left-sided pleural effusion. No pneumothorax. Central pulmonary airways appear patent. Upper abdomen: Limited early arterial phase evaluation of the upper abdomen demonstrates nasogastric tube within the gastric fundus, tip excluded from view. 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 Musculoskeletal: No acute or aggressive osseous abnormalities. Severe degenerative change of the bilateral glenohumeral joints, right greater than left. Stigmata of dish within the thoracic spine. IMPRESSION: 1. No evidence of pulmonary embolism. 2. Worsening left lower lobe atelectasis/collapse. 3. Cardiomegaly  with ill-defined areas of ground-glass within the bilateral upper lobes, potentially air trapping though early/mild pulmonary edema could have a similar appearance. Clinical correlation is advised. 4. 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 Recommend annual imaging followup by CTA or MRA. This recommendation follows 2010 ACCF/AHA/AATS/ACR/ASA/SCA/SCAI/SIR/STS/SVM Guidelines for the Diagnosis and Management of Patients with Thoracic Aortic Disease. Circulation. 2010; 121: E675-Q492. Aortic aneurysm NOS (ICD10-I71.9) 5. Otherwise, stable ancillary findings as detailed above. Electronically Signed   By: Sandi Mariscal M.D.   On: 08/23/2019 14:08        Scheduled Meds: . bisacodyl  10 mg Rectal BID  . gabapentin  300 mg Oral TID  . pantoprazole (PROTONIX) IV  40 mg Intravenous Q24H  . potassium chloride  40 mEq Oral Once  . venlafaxine  37.5 mg Oral Daily   Continuous Infusions:   LOS: 4 days    Time spent: 30 minutes    Shine Scrogham Darleen Crocker, DO [facility]  If 7PM-7AM, please contact night-coverage [email or web address] 08/24/2019, 11:52 AM

## 2019-08-24 NOTE — Progress Notes (Signed)
Attempted once again to get patient to allow RN to replace NG tube, patient absolutely refuses to have NG replaced. NG tube had output of 200 ml of brown liquid,  from 1900 to 0230 when patient pulled tube out.

## 2019-08-24 NOTE — Progress Notes (Signed)
Had long discussion with patient about NG tube and her needing to leave it alone.  Patient constantly c/o wanting tube removed.  Patient even stated MD told her he was taking it out first thing this am.  MD notes do not reflect this.  Patient pulled Ng tub out and is refusing to allow RN to replace tube.  Notified on call MD, awaiting his response as to whether he will come speak with her about the need for the tube. On call MD returned page stated he would come up and speak with patient about the need for NG tube to be replaced and left in place until MD pulls it

## 2019-08-25 LAB — CBC
HCT: 43.2 % (ref 36.0–46.0)
Hemoglobin: 13.4 g/dL (ref 12.0–15.0)
MCH: 29.6 pg (ref 26.0–34.0)
MCHC: 31 g/dL (ref 30.0–36.0)
MCV: 95.4 fL (ref 80.0–100.0)
Platelets: 293 10*3/uL (ref 150–400)
RBC: 4.53 MIL/uL (ref 3.87–5.11)
RDW: 15.4 % (ref 11.5–15.5)
WBC: 13 10*3/uL — ABNORMAL HIGH (ref 4.0–10.5)
nRBC: 0 % (ref 0.0–0.2)

## 2019-08-25 LAB — BASIC METABOLIC PANEL
Anion gap: 13 (ref 5–15)
BUN: 28 mg/dL — ABNORMAL HIGH (ref 8–23)
CO2: 28 mmol/L (ref 22–32)
Calcium: 9 mg/dL (ref 8.9–10.3)
Chloride: 103 mmol/L (ref 98–111)
Creatinine, Ser: 0.72 mg/dL (ref 0.44–1.00)
GFR calc Af Amer: >60 mL/min (ref >60)
GFR calc non Af Amer: >60 mL/min (ref >60)
Glucose, Bld: 120 mg/dL — ABNORMAL HIGH (ref 70–99)
Potassium: 3.3 mmol/L — ABNORMAL LOW (ref 3.5–5.1)
Sodium: 144 mmol/L (ref 135–145)

## 2019-08-25 LAB — CULTURE, BLOOD (ROUTINE X 2)
Culture: NO GROWTH
Culture: NO GROWTH
Special Requests: ADEQUATE

## 2019-08-25 LAB — MAGNESIUM: Magnesium: 1.9 mg/dL (ref 1.7–2.4)

## 2019-08-25 MED ORDER — POTASSIUM CHLORIDE CRYS ER 20 MEQ PO TBCR
40.0000 meq | EXTENDED_RELEASE_TABLET | Freq: Two times a day (BID) | ORAL | Status: AC
  Administered 2019-08-25 (×2): 40 meq via ORAL
  Filled 2019-08-25: qty 4
  Filled 2019-08-25: qty 2

## 2019-08-25 MED ORDER — POLYETHYLENE GLYCOL 3350 17 G PO PACK
17.0000 g | PACK | Freq: Two times a day (BID) | ORAL | Status: DC
  Administered 2019-08-25 – 2019-08-26 (×3): 17 g via ORAL
  Filled 2019-08-25 (×3): qty 1

## 2019-08-25 NOTE — Progress Notes (Signed)
   08/25/19 1441  Assess: MEWS Score  Temp 98.4 F (36.9 C)  BP (!) 133/91  Pulse Rate (!) 109 (RN Notified)  Resp (!) 22  SpO2 94 %  O2 Device Nasal Cannula  Assess: MEWS Score  MEWS Temp 0  MEWS Systolic 0  MEWS Pulse 1  MEWS RR 1  MEWS LOC 0  MEWS Score 2  MEWS Score Color Yellow  Assess: if the MEWS score is Yellow or Red  Were vital signs taken at a resting state? No (ambulation from chair to bed)  Focused Assessment Documented focused assessment  Early Detection of Sepsis Score *See Row Information* Low  MEWS guidelines implemented *See Row Information* No, vital signs rechecked  Treat  MEWS Interventions Other (Comment) (Adjusted patient in bed, patient less restless at this time)  Take Vital Signs  Increase Vital Sign Frequency  Yellow: Q 2hr X 2 then Q 4hr X 2, if remains yellow, continue Q 4hrs  Escalate  MEWS: Escalate Yellow: discuss with charge nurse/RN and consider discussing with provider and RRT  Notify: Charge Nurse/RN  Name of Charge Nurse/RN Notified Misty Stanley B  Date Charge Nurse/RN Notified 08/25/19  Time Charge Nurse/RN Notified 1441

## 2019-08-25 NOTE — Progress Notes (Signed)
Subjective: Patient denies any abdominal pain.  Did have an episode of a small amount of emesis, which she states was mostly phlegm.  She is passing gas.  Objective: Vital signs in last 24 hours: Temp:  [97.4 F (36.3 C)-98.1 F (36.7 C)] 97.4 F (36.3 C) (04/26 0533) Pulse Rate:  [97-103] 102 (04/26 0533) Resp:  [18-20] 20 (04/26 0533) BP: (154-179)/(81-88) 154/88 (04/26 0533) SpO2:  [77 %-96 %] 96 % (04/26 0533) Last BM Date: 08/22/19  Intake/Output from previous day: 04/25 0701 - 04/26 0700 In: 600 [P.O.:600] Out: 900 [Urine:900] Intake/Output this shift: No intake/output data recorded.  General appearance: alert, cooperative and no distress GI: soft, non-tender; bowel sounds normal; no masses,  no organomegaly  Lab Results:  Recent Labs    08/24/19 0741 08/25/19 0443  WBC 9.7 13.0*  HGB 11.9* 13.4  HCT 38.9 43.2  PLT 241 293   BMET Recent Labs    08/24/19 0741 08/25/19 0443  NA 144 144  K 3.6 3.3*  CL 102 103  CO2 29 28  GLUCOSE 86 120*  BUN 35* 28*  CREATININE 0.85 0.72  CALCIUM 8.4* 9.0   PT/INR No results for input(s): LABPROT, INR in the last 72 hours.  Studies/Results: CT ANGIO CHEST PE W OR WO CONTRAST  Result Date: 08/23/2019 CLINICAL DATA:  Indeterminate V/Q scan. Evaluate for pulmonary embolism. EXAM: CT ANGIOGRAPHY CHEST WITH CONTRAST TECHNIQUE: Multidetector CT imaging of the chest was performed using the standard protocol during bolus administration of intravenous contrast. Multiplanar CT image reconstructions and MIPs were obtained to evaluate the vascular anatomy. CONTRAST:  35mL OMNIPAQUE IOHEXOL 350 MG/ML SOLN COMPARISON:  Nuclear medicine V/Q scan-08/20/2019; chest CT-08/20/2019; 06/03/2018; CT abdomen pelvis-08/20/2019 FINDINGS: Vascular Findings: There is adequate opacification of the pulmonary arterial system with the main pulmonary artery measuring 246 Hounsfield units. There are no discrete filling defects within the pulmonary  arterial tree to suggest pulmonary embolism. Enlarged caliber of the main pulmonary artery measuring 3.9 cm in diameter (image 37, series 4). Cardiomegaly. Pacer leads terminate within the right atrium and ventricle. Calcifications about the mitral valve annulus. Post percutaneous aortic valve replacement. 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 Review of the MIP images confirms the above findings. ---------------------------------------------------------------------------------- Nonvascular Findings: Mediastinum/Lymph Nodes: No bulky mediastinal, hilar axillary lymphadenopathy. Lungs/Pleura: Worsening left basilar atelectasis/collapse. Ill-defined areas of ground-glass within the bilateral upper lobes is favored to represent areas of air trapping. Potential trace left-sided pleural effusion. No pneumothorax. Central pulmonary airways appear patent. Upper abdomen: Limited early arterial phase evaluation of the upper abdomen demonstrates nasogastric tube within the gastric fundus, tip excluded from view. 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 Musculoskeletal: No acute or aggressive osseous abnormalities. Severe degenerative change of the bilateral glenohumeral joints, right greater than left. Stigmata of dish within the thoracic spine. IMPRESSION: 1. No evidence of pulmonary embolism. 2. Worsening left lower lobe atelectasis/collapse. 3. Cardiomegaly with ill-defined areas of ground-glass within the bilateral upper lobes, potentially air trapping though early/mild pulmonary edema could have a similar appearance. Clinical correlation is advised. 4. 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 Recommend annual imaging followup by CTA or MRA. This  recommendation follows 2010 ACCF/AHA/AATS/ACR/ASA/SCA/SCAI/SIR/STS/SVM Guidelines for the Diagnosis and Management of Patients with Thoracic Aortic Disease. Circulation. 2010; 121: R518-A416. Aortic aneurysm NOS (  ICD10-I71.9) 5. Otherwise, stable ancillary findings as detailed above. Electronically Signed   By: Simonne Come M.D.   On: 08/23/2019 14:08   DG Chest Port 1 View  Result Date: 08/24/2019 CLINICAL DATA:  NG tube placement. EXAM: PORTABLE CHEST 1 VIEW COMPARISON:  08/20/2019 chest radiograph FINDINGS: An NG tube is identified entering the stomach with tip off the field of view. Cardiomediastinal silhouette is unchanged. LEFT LOWER lung consolidation/atelectasis and RIGHT LOWER lung atelectasis again noted. A pacemaker and aortic valve replacement again noted. IMPRESSION: NG tube entering the stomach with tip off the field of view. Electronically Signed   By: Harmon Pier M.D.   On: 08/24/2019 12:30    Anti-infectives: Anti-infectives (From admission, onward)   Start     Dose/Rate Route Frequency Ordered Stop   08/22/19 1100  vancomycin (VANCOREADY) IVPB 1750 mg/350 mL  Status:  Discontinued     1,750 mg 175 mL/hr over 120 Minutes Intravenous Every 48 hours 08/20/19 1550 08/20/19 1947   08/21/19 1000  ceFEPIme (MAXIPIME) 2 g in sodium chloride 0.9 % 100 mL IVPB  Status:  Discontinued     2 g 200 mL/hr over 30 Minutes Intravenous Every 24 hours 08/20/19 1550 08/20/19 1947   08/20/19 1100  vancomycin (VANCOREADY) IVPB 2000 mg/400 mL     2,000 mg 200 mL/hr over 120 Minutes Intravenous  Once 08/20/19 1007 08/20/19 1332   08/20/19 1000  ceFEPIme (MAXIPIME) 2 g in sodium chloride 0.9 % 100 mL IVPB     2 g 200 mL/hr over 30 Minutes Intravenous  Once 08/20/19 0954 08/20/19 1342   08/20/19 1000  metroNIDAZOLE (FLAGYL) IVPB 500 mg     500 mg 100 mL/hr over 60 Minutes Intravenous  Once 08/20/19 0954 08/20/19 1332   08/20/19 1000  vancomycin (VANCOCIN) IVPB 1000 mg/200 mL premix  Status:   Discontinued     1,000 mg 200 mL/hr over 60 Minutes Intravenous  Once 08/20/19 0954 08/20/19 1007      Assessment/Plan: Impression: Partial small bowel obstruction versus ileus, slowly resolving.  Seems to be tolerating full liquid diet. Plan: Will add MiraLAX to her regimen.  May advance diet should her bowel function fully return.  No need for surgical intervention at this time.  May consider repeat CT scan of the abdomen should she not progress.  LOS: 5 days    Franky Macho 08/25/2019

## 2019-08-25 NOTE — Evaluation (Signed)
Physical Therapy Evaluation Patient Details Name: Dawn Gonzales MRN: [id number] DOB: 1941-02-24 Today's Date: 08/25/2019   History of Present Illness  Dawn Gonzales is a 78 y.o. female with medical history significant for complete heart block with pacemaker, diastolic CHF, morbid obesity, aortic stenosis status post TAVR, RBBB.EMS was called out for generalized weakness, and low back pain.  Patient was found to be hypoxic with O2 sats 80% on room air.  Patient is not on home oxygen.  Patient denies difficulty breathing, has mild chronic cough that is unchanged, no wheezing.  Reports some mild occasional sharp chest pains on the left side of her chest, but she has not had any chest pains today.  She is unaware if she has had any leg swelling or redness, as she has not looked at her legs.  Today she has had shoulder pains.  She has chronic arthritis back pain, that is unchanged from prior,  For which she takes Tylenol.  Denies falls.  She lives alone, but her daughter comes in to check on her.  She ambulates with a wheelchair.She tells me she has had multiple episodes of vomiting every day over the past few days, she has not had any vomiting today.  She also reports poor p.o. intake.  Last bowel movement was a few days ago, she is unsure of exactly when.  She denies abdominal pain.She denies urinary frequency or pain with urination.  She denies bloody stools vomiting of blood or black stools since discharge.    Clinical Impression  Patient demonstrates slow labored movement for sitting up at bedside, at high risk for falls due to BLE weakness, limited to a few slow unsteady side steps with difficulty advancing BLE due to weakness and on room air with SpO2 between 88-92%.  Once sitting in chair patient c/o difficulty breathing and put on 2 LPM with SpO2 maintaining 92% - RN notified.  Patient will benefit from continued physical therapy in hospital and recommended venue below to increase  strength, balance, endurance for safe ADLs and gait.     Follow Up Recommendations SNF    Equipment Recommendations  None recommended by PT    Recommendations for Other Services       Precautions / Restrictions Precautions Precautions: Fall Restrictions Weight Bearing Restrictions: No      Mobility  Bed Mobility Overal bed mobility: Needs Assistance Bed Mobility: Supine to Sit     Supine to sit: Min assist;Mod assist     General bed mobility comments: slow labored movement  Transfers Overall transfer level: Needs assistance Equipment used: Rolling walker (2 wheeled) Transfers: Sit to/from [facility] Sit to Stand: Mod assist Stand pivot transfers: Mod assist       General transfer comment: unsteady on feet due to BLE weakness  Ambulation/Gait Ambulation/Gait assistance: Mod assist;Max assist Gait Distance (Feet): 3 Feet Assistive device: Rolling walker (2 wheeled) Gait Pattern/deviations: Decreased step length - right;Decreased step length - left;Decreased stride length Gait velocity: decreased   General Gait Details: limited to 3-4 very unsteady labored side steps with difficulty advancing BLE due to weakness  Stairs            Wheelchair Mobility    Modified Rankin (Stroke Patients Only)       Balance Overall balance assessment: Needs assistance Sitting-balance support: Feet supported;No upper extremity supported Sitting balance-Leahy Scale: Fair Sitting balance - Comments: seated at EOB   Standing balance support: During functional activity;Bilateral upper extremity supported Standing balance-Leahy Scale:  Poor Standing balance comment: using RW                             Pertinent Vitals/Pain Pain Assessment: 0-10 Pain Score: 3  Pain Location: stomach Pain Descriptors / Indicators: Aching Pain Intervention(s): Limited activity within patient's tolerance;Monitored during session    Home Living  Family/patient expects to be discharged to:: Private residence Living Arrangements: Alone Available Help at Discharge: Family;Available PRN/intermittently Type of Home: House Home Access: Ramped entrance     Home Layout: One level Home Equipment: Walker - 2 wheels;Wheelchair - manual;Bedside commode      Prior Function Level of Independence: Needs assistance   Gait / Transfers Assistance Needed: household ambulator using RW  ADL's / Homemaking Assistance Needed: Assisted with Community ADL's by family (her daughter lives next door)        Higher education careers adviser Dominance   Dominant Hand: Right    Extremity/Trunk Assessment   Upper Extremity Assessment Upper Extremity Assessment: Generalized weakness    Lower Extremity Assessment Lower Extremity Assessment: Generalized weakness    Cervical / Trunk Assessment Cervical / Trunk Assessment: Kyphotic  Communication   Communication: No difficulties  Cognition Arousal/Alertness: Awake/alert Behavior During Therapy: WFL for tasks assessed/performed Overall Cognitive Status: Within Functional Limits for tasks assessed                                        General Comments      Exercises     Assessment/Plan    PT Assessment Patient needs continued PT services  PT Problem List Decreased strength;Decreased activity tolerance;Decreased balance;Decreased mobility       PT Treatment Interventions Gait training;Stair training;Functional mobility training;Therapeutic activities;Therapeutic exercise;Patient/family education    PT Goals (Current goals can be found in the Care Plan section)  Acute Rehab PT Goals Patient Stated Goal: return home with family to assist PT Goal Formulation: With patient Time For Goal Achievement: 09/08/19 Potential to Achieve Goals: Good    Frequency Min 3X/week   Barriers to discharge        Co-evaluation               AM-PAC PT "6 Clicks" Mobility  Outcome Measure Help needed  turning from your back to your side while in a flat bed without using bedrails?: A Lot Help needed moving from lying on your back to sitting on the side of a flat bed without using bedrails?: A Lot Help needed moving to and from a bed to a chair (including a wheelchair)?: A Lot Help needed standing up from a chair using your arms (e.g., wheelchair or bedside chair)?: A Lot Help needed to walk in hospital room?: A Lot Help needed climbing 3-5 steps with a railing? : Total 6 Click Score: 11    End of Session Equipment Utilized During Treatment: Oxygen Activity Tolerance: Patient tolerated treatment well;Patient limited by fatigue Patient left: in chair;with call bell/phone within reach Nurse Communication: Mobility status PT Visit Diagnosis: Unsteadiness on feet (R26.81);Other abnormalities of gait and mobility (R26.89);Muscle weakness (generalized) (M62.81);History of falling (Z91.81)    Time: 1245-8099 PT Time Calculation (min) (ACUTE ONLY): 22 min   Charges:   PT Evaluation $PT Eval Moderate Complexity: 1 Mod PT Treatments $Therapeutic Activity: 8-22 mins        2:30 PM, 08/25/19 Ocie Bob, MPT Physical Therapist with Dawn Gonzales  [place] office [phone] mobile phone

## 2019-08-25 NOTE — Care Management Important Message (Signed)
Important Message  Patient Details  Name: Dawn Gonzales MRN: [id number] Date of Birth: 1940/10/06   Medicare Important Message Given:  Yes     Corey Harold 08/25/2019, 3:33 PM

## 2019-08-25 NOTE — Progress Notes (Signed)
PROGRESS NOTE    Dawn Gonzales  INO:[id number] DOB: [birth year] DOA: 08/20/2019 PCP: Benita Stabile, MD   Brief Narrative:  Per HPI: Dawn Long [place] a 79 y.o.femalewith medical history significant forcomplete heart block with pacemaker,diastolic CHF, morbid obesity, aortic stenosis status post TAVR, RBBB. EMS was called out for generalized weakness, and low back pain. Patient was found to be hypoxic with O2 sats 80% on room air. Patient is not on home oxygen.  Patient denies difficulty breathing, has mild chronic cough that is unchanged, no wheezing. Reports some mild occasional sharp chest pains on the left side of her chest, but she has not had any chest pains today. She is unaware if she has had any leg swelling or redness, asshehas not looked ather legs. Today she has had shoulder pains. She has chronic arthritis back pain, that is unchanged from prior,For which she takes Tylenol. Denies falls. She lives alone, but her daughter comes in to check on her. She ambulates with a wheelchair. She tells me she has had multiple episodes of vomiting every day over the past few days, she has not had any vomiting today. She also reports poor p.o. intake. Last bowel movement was a few days ago,she is unsure of exactly when. She denies abdominal pain. She denies urinary frequency or pain with urination. She denies bloody stools vomiting of blood or black stools since discharge.  4/22:Patient has been admitted with concern for gallstone ileus and has associated AKI as well as acute hypoxemic respiratory failure. There was some question of possible PE and now she has an indeterminate VQ scan. She remains on full dose Lovenox at the moment and has worsening AKI and will require greater amounts of IV fluid since she still has significant NG tube output noted. LVEF noted to be 60 to 65% on 07/06/2019. Continue to monitor labs. General surgery evaluation with plans for  conservative management at this point using NG tube.  4/23: Patient continues to have high amounts of NG tube output with no bowel movement noted. Her renal function is improving on aggressive IV fluid hydration. Continue monitor a.m. labs.  4/24:Patient continues to have high amounts of output via NG tube, but did have bowel movement last night. Plan to continue NG tube to suction due to high output per general surgery. She is noted to have some bloodyNG tube output, but this appears minimal. Renal function has improved, and will check CT chest with contrast to ensure that there is no PE and hopefully, will be able to lower dose of her Lovenox. Supplement potassium.  4/25: Patient removed NG tube last night and this was replaced this morning by nursing staff.  Seen by general surgery and NG tube was removed this morning.  She has been started on a full liquid diet.  CT chest with contrast on 4/24 with no signs of PE noted.  Lovenox has therefore been held and she has been placed on SCDs given bloody NG tube output.  Continue to follow a.m. labs.  4/26: Patient is tolerating full liquid diet and did have a small amount of emesis last night.  General surgery has recommended MiraLAX which has been initiated.  Continue to monitor throughout the day for improving bowel function.  Supplement potassium.  PT evaluation pending.  Anticipate discharge in next 24 hours if stable and improved.  Assessment & Plan:   Principal Problem:   Hypoxia Active Problems:   Obesity, Class III, BMI 40-49.9 (morbid obesity) (HCC)  Sleep apnea   Hypertension   Chronic diastolic CHF (congestive heart failure), NYHA class 2 (HCC)   Gallstone ileus (HCC)   Complete heart block (HCC)   History of cardiac pacemaker   Small bowel obstruction (HCC)   Generalized weakness likely secondary to dehydration/AKI in the setting of gallstone ileus -Continue overall hydration and conservative management as noted  below -Mostly wheelchair-bound at home -PT evaluation ordered for today  Ileus-resolving -Possibly related to gallstones, but this remains uncertain -NG tube discontinued 4/25 -Monitor electrolytes -Appreciate general surgery evaluation, with full diet initiated -MiraLAX ordered  AKI-resolved -Prerenal and nonoliguric -Noted to have solid mass to right kidney on CT with renal ultrasoundperformed. Patient is aware of this and should follow-up with MRI in the outpatient setting. -Urine electrolytesevaluated -Aggressive IV fluid and monitor I's and O's -Avoid nephrotoxic agents and follow a.m. labs -Discontinue IV fluid and recheck a.m. renal panel  Acute hypoxemic respiratory failure-multifactorial -CT chest study is negative and Lovenox has been discontinued -May very well be related more to OHS/OSA and atelectasis -Continue incentive spirometry and wean oxygen as tolerated  Hypokalemia-resolved -Recheck in a.m.  Worsening anemia -Dilutional versus related to NG tube trauma -Lovenox has been currently held and patient has been switched to SCDs -Monitor a.m. CBC  Prolonged QTC -537 ms on EKG which is improved today -Continue to monitor a.m. potassium and magnesium  Chronic diastolic CHF with permanent pacemaker and TAVR -Last 2D echocardiogram 06/2019 with EF 60-65% -Monitor carefully  OSA/morbid obesity -Likely source of hypoxemia, but will plan to rule out PE today -CPAP nightly  DVT prophylaxis:Full dose Lovenox switched to SCDs Code Status:Full code Family Communication:Discussed with daughter on phone, [name] Disposition Plan:Continue on full liquid diet with MiraLAX ordered.  Full bowel function has not quite returned.  Anticipate discharge in next 24 hours if further improved.  Supplement potassium.  PT evaluation pending.   Consultants:  General Surgery  Procedures:  None  Antimicrobials:   None  Subjective: Patient  seen and evaluated today with no new acute complaints or concerns. No acute concerns or events noted overnight.  She appears to be tolerating some of her diet.  She did have an episode of emesis last night.  She is passing gas, no bowel movement.  Objective: Vitals:   08/24/19 1314 08/24/19 2045 08/24/19 2208 08/25/19 0533  BP: (!) 169/81  (!) 179/86 (!) 154/88  Pulse: 97  (!) 103 (!) 102  Resp: 18  20 20   Temp: 98.1 F (36.7 C)  97.9 F (36.6 C) (!) 97.4 F (36.3 C)  TempSrc:      SpO2: (!) 77% 94% 94% 96%  Weight:      Height:        Intake/Output Summary (Last 24 hours) at 08/25/2019 1104 Last data filed at 08/24/2019 1700 Gross per 24 hour  Intake 600 ml  Output 900 ml  Net -300 ml   Filed Weights   08/20/19 0910 08/20/19 2008  Weight: 125.6 kg 124.2 kg    Examination:  General exam: Appears calm and comfortable, obese Respiratory system: Clear to auscultation. Respiratory effort normal.  On nasal cannula oxygen. Cardiovascular system: S1 & S2 heard, RRR. No JVD, murmurs, rubs, gallops or clicks. No pedal edema. Gastrointestinal system: Abdomen is nondistended, soft and nontender. No organomegaly or masses felt. Normal bowel sounds heard. Central nervous system: Alert and oriented. No focal neurological deficits. Extremities: Symmetric 5 x 5 power. Skin: No rashes, lesions or ulcers Psychiatry: Judgement and  insight appear normal. Mood & affect appropriate.     Data Reviewed: I have personally reviewed following labs and imaging studies  CBC: Recent Labs  Lab 08/20/19 1046 08/21/19 0542 08/22/19 0512 08/24/19 0741 08/25/19 0443  WBC 16.7* 15.2* 9.6 9.7 13.0*  NEUTROABS 14.1*  --   --   --   --   HGB 15.5* 13.5 12.3 11.9* 13.4  HCT 48.3* 43.5 40.2 38.9 43.2  MCV 93.8 95.8 98.3 96.3 95.4  PLT 341 285 235 241 277   Basic Metabolic Panel: Recent Labs  Lab 08/20/19 1046 08/20/19 1046 08/21/19 0542 08/22/19 0512 08/23/19 0507 08/24/19 0741  08/25/19 0443  NA 137   < > 138 140 142 144 144  K 3.8   < > 3.9 3.5 2.8* 3.6 3.3*  CL 90*   < > 96* 100 101 102 103  CO2 31   < > 29 27 29 29 28   GLUCOSE 133*   < > 124* 88 79 86 120*  BUN 45*   < > 62* 70* 52* 35* 28*  CREATININE 2.23*   < > 2.85* 2.18* 1.17* 0.85 0.72  CALCIUM 9.7   < > 8.5* 8.2* 8.4* 8.4* 9.0  MG 2.1  --   --  2.1 2.1 1.9 1.9   < > = values in this interval not displayed.   GFR: Estimated Creatinine Clearance: 76.8 mL/min (by C-G formula based on SCr of 0.72 mg/dL). Liver Function Tests: Recent Labs  Lab 08/20/19 1046 08/21/19 0542 08/22/19 0512 08/23/19 0507  AST 34 24 18 16   ALT 22 20 17 15   ALKPHOS 75 62 56 48  BILITOT 1.1 0.8 0.7 1.0  PROT 7.6 5.9* 5.6* 5.5*  ALBUMIN 3.7 2.8* 2.5* 2.3*   Recent Labs  Lab 08/20/19 1046  LIPASE 29   No results for input(s): AMMONIA in the last 168 hours. Coagulation Profile: Recent Labs  Lab 08/20/19 1046  INR 1.1   Cardiac Enzymes: No results for input(s): CKTOTAL, CKMB, CKMBINDEX, TROPONINI in the last 168 hours. BNP (last 3 results) No results for input(s): PROBNP in the last 8760 hours. HbA1C: No results for input(s): HGBA1C in the last 72 hours. CBG: No results for input(s): GLUCAP in the last 168 hours. Lipid Profile: No results for input(s): CHOL, HDL, LDLCALC, TRIG, CHOLHDL, LDLDIRECT in the last 72 hours. Thyroid Function Tests: No results for input(s): TSH, T4TOTAL, FREET4, T3FREE, THYROIDAB in the last 72 hours. Anemia Panel: No results for input(s): VITAMINB12, FOLATE, FERRITIN, TIBC, IRON, RETICCTPCT in the last 72 hours. Sepsis Labs: Recent Labs  Lab 08/20/19 1047 08/20/19 1248  LATICACIDVEN 1.5 1.3    Recent Results (from the past 240 hour(s))  Respiratory Panel by RT PCR (Flu A&B, Covid) - Nasopharyngeal Swab     Status: None   Collection Time: 08/20/19  9:56 AM   Specimen: Nasopharyngeal Swab  Result Value Ref Range Status   SARS Coronavirus 2 by RT PCR NEGATIVE NEGATIVE Final     Comment: (NOTE) SARS-CoV-2 target nucleic acids are NOT DETECTED. The SARS-CoV-2 RNA is generally detectable in upper respiratoy specimens during the acute phase of infection. The lowest concentration of SARS-CoV-2 viral copies this assay can detect is 131 copies/mL. A negative result does not preclude SARS-Cov-2 infection and should not be used as the sole basis for treatment or other patient management decisions. A negative result may occur with  improper specimen collection/handling, submission of specimen other than nasopharyngeal swab, presence of viral mutation(s) within the  areas targeted by this assay, and inadequate number of viral copies (<131 copies/mL). A negative result must be combined with clinical observations, patient history, and epidemiological information. The expected result is Negative. Fact Sheet for Patients:  https://www.moore.com/ Fact Sheet for Healthcare Providers:  https://www.young.biz/ This test is not yet ap proved or cleared by the [place] FDA and  has been authorized for detection and/or diagnosis of SARS-CoV-2 by FDA under an Emergency Use Authorization (EUA). This EUA will remain  in effect (meaning this test can be used) for the duration of the COVID-19 declaration under Section 564(b)(1) of the Act, 21 U.S.C. section 360bbb-3(b)(1), unless the authorization is terminated or revoked sooner.    Influenza A by PCR NEGATIVE NEGATIVE Final   Influenza B by PCR NEGATIVE NEGATIVE Final    Comment: (NOTE) The Xpert Xpress SARS-CoV-2/FLU/RSV assay is intended as an aid in  the diagnosis of influenza from Nasopharyngeal swab specimens and  should not be used as a sole basis for treatment. Nasal washings and  aspirates are unacceptable for Xpert Xpress SARS-CoV-2/FLU/RSV  testing. Fact Sheet for Patients: https://www.moore.com/ Fact Sheet for Healthcare  Providers: https://www.young.biz/ This test is not yet approved or cleared by the [place] FDA and  has been authorized for detection and/or diagnosis of SARS-CoV-2 by  FDA under an Emergency Use Authorization (EUA). This EUA will remain  in effect (meaning this test can be used) for the duration of the  Covid-19 declaration under Section 564(b)(1) of the Act, 21  U.S.C. section 360bbb-3(b)(1), unless the authorization is  terminated or revoked. Performed at [facility], [address], [place], [place] [address]   Blood Culture (routine x 2)     Status: None   Collection Time: 08/20/19 10:47 AM   Specimen: BLOOD LEFT HAND  Result Value Ref Range Status   Specimen Description BLOOD LEFT HAND DRAWN BY RN  Final   Special Requests   Final    BOTTLES DRAWN AEROBIC AND ANAEROBIC Blood Culture adequate volume   Culture   Final    NO GROWTH 5 DAYS Performed at [facility], [address] [place], [place] [address]    Report Status 08/25/2019 FINAL  Final  Blood Culture (routine x 2)     Status: None   Collection Time: 08/20/19 10:48 AM   Specimen: BLOOD RIGHT HAND  Result Value Ref Range Status   Specimen Description BLOOD RIGHT HAND DRAWN BY RN  Final   Special Requests   Final    BOTTLES DRAWN AEROBIC AND ANAEROBIC Blood Culture results may not be optimal due to an inadequate volume of blood received in culture bottles   Culture   Final    NO GROWTH 5 DAYS Performed at [facility], [address] [place], [place] [address]    Report Status 08/25/2019 FINAL  Final  Urine culture     Status: Abnormal   Collection Time: 08/20/19  1:49 PM   Specimen: Urine, Catheterized  Result Value Ref Range Status   Specimen Description   Final    URINE, CATHETERIZED Performed at [facility], [address] [place], [place] [address]    Special Requests   Final    NONE Performed at [facility], [address] [place], [place] [address]    Culture (A)  Final     >=100,000 COLONIES/mL LACTOBACILLUS SPECIES Standardized susceptibility testing for this organism is not available. Performed at [facility], [address] [place], [place] [address]  Report Status 08/22/2019 FINAL  Final         Radiology Studies: CT ANGIO CHEST PE W OR WO CONTRAST  Result Date: 08/23/2019 CLINICAL DATA:  Indeterminate V/Q scan. Evaluate for pulmonary embolism. EXAM: CT ANGIOGRAPHY CHEST WITH CONTRAST TECHNIQUE: Multidetector CT imaging of the chest was performed using the standard protocol during bolus administration of intravenous contrast. Multiplanar CT image reconstructions and MIPs were obtained to evaluate the vascular anatomy. CONTRAST:  61mL OMNIPAQUE IOHEXOL 350 MG/ML SOLN COMPARISON:  Nuclear medicine V/Q scan-08/20/2019; chest CT-08/20/2019; 06/03/2018; CT abdomen pelvis-08/20/2019 FINDINGS: Vascular Findings: There is adequate opacification of the pulmonary arterial system with the main pulmonary artery measuring 246 Hounsfield units. There are no discrete filling defects within the pulmonary arterial tree to suggest pulmonary embolism. Enlarged caliber of the main pulmonary artery measuring 3.9 cm in diameter (image 37, series 4). Cardiomegaly. Pacer leads terminate within the right atrium and ventricle. Calcifications about the mitral valve annulus. Post percutaneous aortic valve replacement. 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 Review of the MIP images confirms the above findings. ---------------------------------------------------------------------------------- Nonvascular Findings: Mediastinum/Lymph Nodes: No bulky mediastinal, hilar axillary lymphadenopathy. Lungs/Pleura: Worsening left basilar atelectasis/collapse. Ill-defined areas of ground-glass within the bilateral upper lobes is favored to represent areas of air trapping. Potential trace left-sided pleural  effusion. No pneumothorax. Central pulmonary airways appear patent. Upper abdomen: Limited early arterial phase evaluation of the upper abdomen demonstrates nasogastric tube within the gastric fundus, tip excluded from view. 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 Musculoskeletal: No acute or aggressive osseous abnormalities. Severe degenerative change of the bilateral glenohumeral joints, right greater than left. Stigmata of dish within the thoracic spine. IMPRESSION: 1. No evidence of pulmonary embolism. 2. Worsening left lower lobe atelectasis/collapse. 3. Cardiomegaly with ill-defined areas of ground-glass within the bilateral upper lobes, potentially air trapping though early/mild pulmonary edema could have a similar appearance. Clinical correlation is advised. 4. 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 Recommend annual imaging followup by CTA or MRA. This recommendation follows 2010 ACCF/AHA/AATS/ACR/ASA/SCA/SCAI/SIR/STS/SVM Guidelines for the Diagnosis and Management of Patients with Thoracic Aortic Disease. Circulation. 2010; 121: I433-I951. Aortic aneurysm NOS (ICD10-I71.9) 5. Otherwise, stable ancillary findings as detailed above. Electronically Signed   By: Simonne Come M.D.   On: 08/23/2019 14:08   DG Chest Port 1 View  Result Date: 08/24/2019 CLINICAL DATA:  NG tube placement. EXAM: PORTABLE CHEST 1 VIEW COMPARISON:  08/20/2019 chest radiograph FINDINGS: An NG tube is identified entering the stomach with tip off the field of view. Cardiomediastinal silhouette is unchanged. LEFT LOWER lung consolidation/atelectasis and RIGHT LOWER lung atelectasis again noted. A pacemaker and aortic valve replacement again noted. IMPRESSION: NG tube entering the stomach with tip off the field of view. Electronically Signed    By: Harmon Pier M.D.   On: 08/24/2019 12:30        Scheduled Meds: . bisacodyl  10 mg Rectal BID  . gabapentin  300 mg Oral TID  . pantoprazole (PROTONIX) IV  40 mg Intravenous Q24H  . polyethylene glycol  17 g Oral BID  . potassium chloride  40 mEq Oral BID  . venlafaxine  37.5 mg Oral Daily   Continuous Infusions:   LOS: 5 days    Time spent: 30 minutes    Maudie Shingledecker D  Sherryll BurgerShah, DO [facility]  If 7PM-7AM, please contact night-coverage [email or web address] 08/25/2019, 11:04 AM

## 2019-08-25 NOTE — Plan of Care (Signed)
  Problem: Acute Rehab PT Goals(only PT should resolve) Goal: Pt Will Go Supine/Side To Sit Outcome: Progressing Flowsheets (Taken 08/25/2019 1431) Pt will go Supine/Side to Sit: . with min guard assist . with minimal assist Goal: Patient Will Transfer Sit To/From Stand Outcome: Progressing Flowsheets (Taken 08/25/2019 1431) Patient will transfer sit to/from stand: with minimal assist Goal: Pt Will Transfer Bed To Chair/Chair To Bed Outcome: Progressing Flowsheets (Taken 08/25/2019 1431) Pt will Transfer Bed to Chair/Chair to Bed: . with mod assist . with min assist Goal: Pt Will Ambulate Outcome: Progressing Flowsheets (Taken 08/25/2019 1431) Pt will Ambulate: . 15 feet . with moderate assist . with rolling walker   2:32 PM, 08/25/19 Ocie Bob, MPT Physical Therapist with [facility] [phone] office [phone] mobile phone

## 2019-08-26 ENCOUNTER — Inpatient Hospital Stay (HOSPITAL_COMMUNITY): Payer: Medicare PPO

## 2019-08-26 LAB — BASIC METABOLIC PANEL
Anion gap: 12 (ref 5–15)
BUN: 35 mg/dL — ABNORMAL HIGH (ref 8–23)
CO2: 28 mmol/L (ref 22–32)
Calcium: 8.8 mg/dL — ABNORMAL LOW (ref 8.9–10.3)
Chloride: 102 mmol/L (ref 98–111)
Creatinine, Ser: 0.91 mg/dL (ref 0.44–1.00)
GFR calc Af Amer: >60 mL/min (ref >60)
GFR calc non Af Amer: >60 mL/min (ref >60)
Glucose, Bld: 121 mg/dL — ABNORMAL HIGH (ref 70–99)
Potassium: 4 mmol/L (ref 3.5–5.1)
Sodium: 142 mmol/L (ref 135–145)

## 2019-08-26 LAB — CBC
HCT: 42.7 % (ref 36.0–46.0)
Hemoglobin: 13.1 g/dL (ref 12.0–15.0)
MCH: 29.2 pg (ref 26.0–34.0)
MCHC: 30.7 g/dL (ref 30.0–36.0)
MCV: 95.1 fL (ref 80.0–100.0)
Platelets: 273 10*3/uL (ref 150–400)
RBC: 4.49 MIL/uL (ref 3.87–5.11)
RDW: 15.2 % (ref 11.5–15.5)
WBC: 14.9 10*3/uL — ABNORMAL HIGH (ref 4.0–10.5)
nRBC: 0 % (ref 0.0–0.2)

## 2019-08-26 LAB — SEDIMENTATION RATE: Sed Rate: 24 mm/hr — ABNORMAL HIGH (ref 0–22)

## 2019-08-26 LAB — MAGNESIUM: Magnesium: 1.8 mg/dL (ref 1.7–2.4)

## 2019-08-26 LAB — BRAIN NATRIURETIC PEPTIDE: B Natriuretic Peptide: 120 pg/mL — ABNORMAL HIGH (ref 0.0–100.0)

## 2019-08-26 LAB — C-REACTIVE PROTEIN: CRP: 22.1 mg/dL — ABNORMAL HIGH (ref <1.0)

## 2019-08-26 LAB — PROCALCITONIN: Procalcitonin: 0.18 ng/mL

## 2019-08-26 MED ORDER — ALBUTEROL SULFATE (2.5 MG/3ML) 0.083% IN NEBU: 2.5000 mg | INHALATION_SOLUTION | Freq: Four times a day (QID) | RESPIRATORY_TRACT | Status: DC | PRN

## 2019-08-26 MED ORDER — ENOXAPARIN SODIUM 60 MG/0.6ML ~~LOC~~ SOLN
60.0000 mg | SUBCUTANEOUS | Status: DC
  Administered 2019-08-26 – 2019-08-28 (×3): 60 mg via SUBCUTANEOUS
  Filled 2019-08-26 (×3): qty 0.6

## 2019-08-26 NOTE — Progress Notes (Signed)
PROGRESS NOTE    Dawn Gonzales  ZOX:[id number] DOB: [birth year] DOA: 08/20/2019 PCP: Benita StabileHall, John Z, MD   Brief Narrative:  Per HPI: Dawn MajorsSandra F Westmorelandis a 79 y.o.femalewith medical history significant forcomplete heart block with pacemaker,diastolic CHF, morbid obesity, aortic stenosis status post TAVR, RBBB. EMS was called out for generalized weakness, and low back pain. Patient was found to be hypoxic with O2 sats 80% on room air. Patient is not on home oxygen.  Patient denies difficulty breathing, has mild chronic cough that is unchanged, no wheezing. Reports some mild occasional sharp chest pains on the left side of her chest, but she has not had any chest pains today. She is unaware if she has had any leg swelling or redness, asshehas not looked ather legs. Today she has had shoulder pains. She has chronic arthritis back pain, that is unchanged from prior,For which she takes Tylenol. Denies falls. She lives alone, but her daughter comes in to check on her. She ambulates with a wheelchair. She tells me she has had multiple episodes of vomiting every day over the past few days, she has not had any vomiting today. She also reports poor p.o. intake. Last bowel movement was a few days ago,she is unsure of exactly when. She denies abdominal pain. She denies urinary frequency or pain with urination. She denies bloody stools vomiting of blood or black stools since discharge.  4/22:Patient has been admitted with concern for gallstone ileus and has associated AKI as well as acute hypoxemic respiratory failure. There was some question of possible PE and now she has an indeterminate VQ scan. She remains on full dose Lovenox at the moment and has worsening AKI and will require greater amounts of IV fluid since she still has significant NG tube output noted. LVEF noted to be 60 to 65% on 07/06/2019. Continue to monitor labs. General surgery evaluation with plans for  conservative management at this point using NG tube.  4/23: Patient continues to have high amounts of NG tube output with no bowel movement noted. Her renal function is improving on aggressive IV fluid hydration. Continue monitor a.m. labs.  4/24:Patient continues to have high amounts of output via NG tube, but did have bowel movement last night. Plan to continue NG tube to suction due to high output per general surgery. She is noted to have some bloodyNG tube output, but this appears minimal. Renal function has improved, and will check CT chest with contrast to ensure that there is no PE and hopefully, will be able to lower dose of her Lovenox. Supplement potassium.  4/25:Patient removed NG tube last night and this was replaced this morning by nursing staff. Seen by general surgery and NG tube was removed this morning. She has been started on a full liquid diet. CT chest with contrast on 4/24 with no signs of PE noted. Lovenox has therefore been held and she has been placed on SCDs given bloody NG tube output. Continue to follow a.m. labs.  4/26: Patient is tolerating full liquid diet and did have a small amount of emesis last night.  General surgery has recommended MiraLAX which has been initiated.  Continue to monitor throughout the day for improving bowel function.  Supplement potassium.  PT evaluation pending.  Anticipate discharge in next 24 hours if stable and improved.  4/27: Patient has been advanced to heart healthy diet by general surgery today as her bowel functions are returning.  She is noted to have some persistent leukocytosis with a  mild upward trend.  She will need close follow-up with [place] and further inflammatory markers will be ordered to check today.  Additionally, she will have chest x-ray and BNP as she has been somewhat more tachypneic and tachycardic today.  Plan for discharge to rehab when stable in the next 24-48 hours.  Assessment & Plan:   Principal  Problem:   Hypoxia Active Problems:   Obesity, Class III, BMI 40-49.9 (morbid obesity) (HCC)   Sleep apnea   Hypertension   Chronic diastolic CHF (congestive heart failure), NYHA class 2 (HCC)   Gallstone ileus (HCC)   Complete heart block (HCC)   History of cardiac pacemaker   Small bowel obstruction (HCC)   Generalized weakness likely secondary to dehydration/AKI in the setting of gallstone ileus -Continue overall hydration and conservative management as noted below -Mostly wheelchair-bound at home -PT evaluation recommending SNF which will be arranged on discharge  Leukocytosis and tachypnea -Plan to check repeat labs as well as inflammatory markers and procalcitonin -Check chest x-ray and BNP -Remains on nasal cannula oxygen -Encouraged incentive spirometry -Albuterol ordered for as needed shortness of breath or wheezing  Ileus-resolving -Possibly related to gallstones, but this remains uncertain -NG tube discontinued 4/25 -Monitor electrolytes -Appreciate general surgery evaluation, with full diet initiated today -MiraLAX ordered twice daily  AKI-resolved -Prerenal and nonoliguric -Noted to have solid mass to right kidney on CT with renal ultrasoundperformed. Patient is aware of this and should follow-up with MRI in the outpatient setting. -Urine electrolytesevaluated -Aggressive IV fluid and monitor I's and O's -Avoid nephrotoxic agents and follow a.m. labs -Discontinue IV fluid and recheck a.m. renal panel  Acute hypoxemic respiratory failure-multifactorial -CT chest PE study is negative and full dose Lovenox had been discontinued -May very well be related more to OHS/OSA and atelectasis -Continue incentive spirometry and wean oxygen as tolerated  Hypokalemia-resolved -Recheck in a.m.  Anemia-stable -Dilutionalversus related to NG tube trauma -Resume Lovenox -Monitor a.m. CBC  Prolonged QTC -537 ms on EKG which is improved today -Continue to  monitor a.m. potassium and magnesium  Chronic diastolic CHF with permanent pacemaker and TAVR -Last 2D echocardiogram 06/2019 with EF 60-65% -Monitor carefully  OSA/morbid obesity -Likely source of hypoxemia, but will plan to rule out PE today -CPAP nightly  DVT prophylaxis:Resume Lovenox Code Status:Full code Family Communication:Discussed with daughter on phone, [name] Disposition Plan: Full diet ordered per general surgery as bowel function appears to have returned.  She is unfortunately having some worsening tachypnea and leukocytosis which will need closer monitoring prior to discharge to SNF.  Anticipate discharge in the next 24-48 hours.   Consultants:  General Surgery  Procedures:  None  Antimicrobials:   None  Subjective: Patient seen and evaluated today with positive bowel movements yesterday and patient is passing flatus.  No nausea or vomiting noted.  She is tolerating diet.  She is noted to have some tachypnea and tachycardia however.  Objective: Vitals:   08/25/19 2039 08/26/19 0510 08/26/19 0653 08/26/19 1243  BP: (!) 149/91 (!) 151/86  (!) 153/95  Pulse: (!) 104 (!) 102 (!) 101 (!) 106  Resp: 20 (!) 28  (!) 22  Temp: 98.4 F (36.9 C) 97.7 F (36.5 C)    TempSrc: Oral Oral    SpO2: 95% 97% 94% 97%  Weight:      Height:        Intake/Output Summary (Last 24 hours) at 08/26/2019 1408 Last data filed at 08/25/2019 2300 Gross per 24 hour  Intake --  Output 700 ml  Net -700 ml   Filed Weights   08/20/19 0910 08/20/19 2008  Weight: 125.6 kg 124.2 kg    Examination:  General exam: Appears calm and comfortable, morbid obesity Respiratory system: Clear to auscultation. Respiratory effort normal.  Remains on nasal cannula. Cardiovascular system: S1 & S2 heard, RRR. No JVD, murmurs, rubs, gallops or clicks. No pedal edema. Gastrointestinal system: Abdomen is nondistended, soft and nontender. No organomegaly or masses felt. Normal  bowel sounds heard. Central nervous system: Alert and oriented. No focal neurological deficits. Extremities: Symmetric 5 x 5 power. Skin: No rashes, lesions or ulcers Psychiatry: Judgement and insight appear normal. Mood & affect appropriate.     Data Reviewed: I have personally reviewed following labs and imaging studies  CBC: Recent Labs  Lab 08/20/19 1046 08/20/19 1046 08/21/19 0542 08/22/19 0512 08/24/19 0741 08/25/19 0443 08/26/19 0531  WBC 16.7*   < > 15.2* 9.6 9.7 13.0* 14.9*  NEUTROABS 14.1*  --   --   --   --   --   --   HGB 15.5*   < > 13.5 12.3 11.9* 13.4 13.1  HCT 48.3*   < > 43.5 40.2 38.9 43.2 42.7  MCV 93.8   < > 95.8 98.3 96.3 95.4 95.1  PLT 341   < > 285 235 241 293 273   < > = values in this interval not displayed.   Basic Metabolic Panel: Recent Labs  Lab 08/22/19 0512 08/23/19 0507 08/24/19 0741 08/25/19 0443 08/26/19 0531  NA 140 142 144 144 142  K 3.5 2.8* 3.6 3.3* 4.0  CL 100 101 102 103 102  CO2 27 29 29 28 28   GLUCOSE 88 79 86 120* 121*  BUN 70* 52* 35* 28* 35*  CREATININE 2.18* 1.17* 0.85 0.72 0.91  CALCIUM 8.2* 8.4* 8.4* 9.0 8.8*  MG 2.1 2.1 1.9 1.9 1.8   GFR: Estimated Creatinine Clearance: 67.5 mL/min (by C-G formula based on SCr of 0.91 mg/dL). Liver Function Tests: Recent Labs  Lab 08/20/19 1046 08/21/19 0542 08/22/19 0512 08/23/19 0507  AST 34 24 18 16   ALT 22 20 17 15   ALKPHOS 75 62 56 48  BILITOT 1.1 0.8 0.7 1.0  PROT 7.6 5.9* 5.6* 5.5*  ALBUMIN 3.7 2.8* 2.5* 2.3*   Recent Labs  Lab 08/20/19 1046  LIPASE 29   No results for input(s): AMMONIA in the last 168 hours. Coagulation Profile: Recent Labs  Lab 08/20/19 1046  INR 1.1   Cardiac Enzymes: No results for input(s): CKTOTAL, CKMB, CKMBINDEX, TROPONINI in the last 168 hours. BNP (last 3 results) No results for input(s): PROBNP in the last 8760 hours. HbA1C: No results for input(s): HGBA1C in the last 72 hours. CBG: No results for input(s): GLUCAP in the  last 168 hours. Lipid Profile: No results for input(s): CHOL, HDL, LDLCALC, TRIG, CHOLHDL, LDLDIRECT in the last 72 hours. Thyroid Function Tests: No results for input(s): TSH, T4TOTAL, FREET4, T3FREE, THYROIDAB in the last 72 hours. Anemia Panel: No results for input(s): VITAMINB12, FOLATE, FERRITIN, TIBC, IRON, RETICCTPCT in the last 72 hours. Sepsis Labs: Recent Labs  Lab 08/20/19 1047 08/20/19 1248  LATICACIDVEN 1.5 1.3    Recent Results (from the past 240 hour(s))  Respiratory Panel by RT PCR (Flu A&B, Covid) - Nasopharyngeal Swab     Status: None   Collection Time: 08/20/19  9:56 AM   Specimen: Nasopharyngeal Swab  Result Value Ref Range Status   SARS Coronavirus 2 by RT  PCR NEGATIVE NEGATIVE Final    Comment: (NOTE) SARS-CoV-2 target nucleic acids are NOT DETECTED. The SARS-CoV-2 RNA is generally detectable in upper respiratoy specimens during the acute phase of infection. The lowest concentration of SARS-CoV-2 viral copies this assay can detect is 131 copies/mL. A negative result does not preclude SARS-Cov-2 infection and should not be used as the sole basis for treatment or other patient management decisions. A negative result may occur with  improper specimen collection/handling, submission of specimen other than nasopharyngeal swab, presence of viral mutation(s) within the areas targeted by this assay, and inadequate number of viral copies (<131 copies/mL). A negative result must be combined with clinical observations, patient history, and epidemiological information. The expected result is Negative. Fact Sheet for Patients:  PinkCheek.be Fact Sheet for Healthcare Providers:  GravelBags.it This test is not yet ap proved or cleared by the [place] FDA and  has been authorized for detection and/or diagnosis of SARS-CoV-2 by FDA under an Emergency Use Authorization (EUA). This EUA will remain  in effect  (meaning this test can be used) for the duration of the COVID-19 declaration under Section 564(b)(1) of the Act, 21 U.S.C. section 360bbb-3(b)(1), unless the authorization is terminated or revoked sooner.    Influenza A by PCR NEGATIVE NEGATIVE Final   Influenza B by PCR NEGATIVE NEGATIVE Final    Comment: (NOTE) The Xpert Xpress SARS-CoV-2/FLU/RSV assay is intended as an aid in  the diagnosis of influenza from Nasopharyngeal swab specimens and  should not be used as a sole basis for treatment. Nasal washings and  aspirates are unacceptable for Xpert Xpress SARS-CoV-2/FLU/RSV  testing. Fact Sheet for Patients: PinkCheek.be Fact Sheet for Healthcare Providers: GravelBags.it This test is not yet approved or cleared by the [place] FDA and  has been authorized for detection and/or diagnosis of SARS-CoV-2 by  FDA under an Emergency Use Authorization (EUA). This EUA will remain  in effect (meaning this test can be used) for the duration of the  Covid-19 declaration under Section 564(b)(1) of the Act, 21  U.S.C. section 360bbb-3(b)(1), unless the authorization is  terminated or revoked. Performed at [facility], [address] [place], [place] [address]   Blood Culture (routine x 2)     Status: None   Collection Time: 08/20/19 10:47 AM   Specimen: BLOOD LEFT HAND  Result Value Ref Range Status   Specimen Description BLOOD LEFT HAND DRAWN BY RN  Final   Special Requests   Final    BOTTLES DRAWN AEROBIC AND ANAEROBIC Blood Culture adequate volume   Culture   Final    NO GROWTH 5 DAYS Performed at [facility], [address] [place], [place] [address]    Report Status 08/25/2019 FINAL  Final  Blood Culture (routine x 2)     Status: None   Collection Time: 08/20/19 10:48 AM   Specimen: BLOOD RIGHT HAND  Result Value Ref Range Status   Specimen Description BLOOD RIGHT HAND DRAWN BY RN  Final   Special Requests   Final     BOTTLES DRAWN AEROBIC AND ANAEROBIC Blood Culture results may not be optimal due to an inadequate volume of blood received in culture bottles   Culture   Final    NO GROWTH 5 DAYS Performed at [facility], [address], [place], [place] [address]    Report Status 08/25/2019 FINAL  Final  Urine culture     Status: Abnormal   Collection Time: 08/20/19  1:49 PM   Specimen:  Urine, Catheterized  Result Value Ref Range Status   Specimen Description   Final    URINE, CATHETERIZED Performed at [facility], [address], [place], [place] [address]    Special Requests   Final    NONE Performed at [facility], [address] [place], [place] [address]    Culture (A)  Final    >=100,000 COLONIES/mL LACTOBACILLUS SPECIES Standardized susceptibility testing for this organism is not available. Performed at [facility], [address] [place], [place] [address]    Report Status 08/22/2019 FINAL  Final         Radiology Studies: No results found.      Scheduled Meds: . bisacodyl  10 mg Rectal BID  . gabapentin  300 mg Oral TID  . pantoprazole (PROTONIX) IV  40 mg Intravenous Q24H  . polyethylene glycol  17 g Oral BID  . venlafaxine  37.5 mg Oral Daily   Continuous Infusions:   LOS: 6 days    Time spent: 35 minutes    Porshe Fleagle Hoover Brunette, DO [facility]  If 7PM-7AM, please contact night-coverage [email or web address] 08/26/2019, 2:08 PM

## 2019-08-26 NOTE — TOC Initial Note (Signed)
Transition of Care [facility]) - Initial/Assessment Note    Patient Details  Name: Dawn Gonzales MRN: [id number] Date of Birth: 1940-05-12  Transition of Care [facility]) CM/SW Contact:    Leitha Bleak, RN Phone Number: 08/26/2019, 11:54 AM  Clinical Narrative:     Patient admitted with hypoxia. Patient lives at home alone. PT recommending SNF. TOC spoke with Huntley Dec - daughter. Patient and Huntley Dec agrees, they are requesting [facility] or [facility] as first choice. TOC called [employer] to start [employer]. Discharge plan is for tomorrow.               Expected Discharge Plan: Skilled Nursing Facility Barriers to Discharge: Continued Medical Work up   Patient Goals and CMS Choice Patient states their goals for this hospitalization and ongoing recovery are:: to go to SNF then home. CMS Medicare.gov Compare Post Acute Care list provided to:: Patient Represenative (must comment) Choice offered to / list presented to : Adult Children  Expected Discharge Plan and Services Expected Discharge Plan: Skilled Nursing Facility        Prior Living Arrangements/Services           Need for Family Participation in Patient Care: Yes (Comment) Care giver support system in place?: Yes (comment)   Criminal Activity/Legal Involvement Pertinent to Current Situation/Hospitalization: No - Comment as needed  Activities of Daily Living Home Assistive Devices/Equipment: Wheelchair, Environmental consultant (specify type) ADL Screening (condition at time of admission) Patient's cognitive ability adequate to safely complete daily activities?: Yes Is the patient deaf or have difficulty hearing?: No Does the patient have difficulty seeing, even when wearing glasses/contacts?: No Does the patient have difficulty concentrating, remembering, or making decisions?: No Patient able to express need for assistance with ADLs?: Yes Does the patient have difficulty dressing or bathing?: No Independently performs ADLs?: Yes  (appropriate for developmental age) Does the patient have difficulty walking or climbing stairs?: Yes Weakness of Legs: Both Weakness of Arms/Hands: None  Permission Sought/Granted      Share Information with NAME: Huntley Dec     Permission granted to share info w Relationship: Daughter     Emotional Assessment     Affect (typically observed): Accepting Orientation: : Oriented to Self Alcohol / Substance Use: Not Applicable Psych Involvement: No (comment)  Admission diagnosis:  Gallstone ileus (HCC) [K56.3] Hypercapnia [R06.89] Hypoxia [R09.02] AKI (acute kidney injury) (HCC) [N17.9] Patient Active Problem List   Diagnosis Date Noted  . Small bowel obstruction (HCC)   . Hypoxia 08/20/2019  . Enteritis 07/05/2019  . 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  . Cholelithiasis 07/05/2019  . Gallstone ileus (HCC)   . Kidney lesion, native, right 07/11/2018  . 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 Sleep apnea   . Hypertension   . Hyperlipemia   . Chronic diastolic CHF (congestive heart failure), NYHA class 2 (HCC)   . Neuropathy   . Physical deconditioning   . History of TIA (transient ischemic attack)   . Right bundle branch block (RBBB)   . Obesity, Class III, BMI 40-49.9 (morbid obesity) (HCC)   . Junctional bradycardia   . Preoperative cardiovascular examination   . Severe aortic stenosis   . Choledocholithiasis   . Epigastric pain 02/06/2018   PCP:  Benita Stabile, MD Pharmacy:   [facility] [address] [place] - [address] [place] [address] Phone: [phone] Fax: [phone]
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
## 2019-08-26 NOTE — Progress Notes (Signed)
  Subjective: Patient states she is slightly better.  She is tolerating full liquid diet well.  Denies any significant emesis.  She is moving her bowels.  She states she only has abdominal pain when she is in a certain sitting position.  Objective: Vital signs in last 24 hours: Temp:  [97.7 F (36.5 C)-98.4 F (36.9 C)] 97.7 F (36.5 C) (04/27 0510) Pulse Rate:  [101-109] 101 (04/27 0653) Resp:  [18-28] 28 (04/27 0510) BP: (133-151)/(86-91) 151/86 (04/27 0510) SpO2:  [94 %-97 %] 94 % (04/27 0653) Last BM Date: 08/25/19  Intake/Output from previous day: 04/26 0701 - 04/27 0700 In: 480 [P.O.:480] Out: 700 [Urine:700] Intake/Output this shift: No intake/output data recorded.  General appearance: alert, cooperative and no distress GI: Soft, nontender, nondistended.  Minimal bowel sounds appreciated.  Examination limited secondary to body habitus.  Lab Results:  Recent Labs    08/25/19 0443 08/26/19 0531  WBC 13.0* 14.9*  HGB 13.4 13.1  HCT 43.2 42.7  PLT 293 273   BMET Recent Labs    08/25/19 0443 08/26/19 0531  NA 144 142  K 3.3* 4.0  CL 103 102  CO2 28 28  GLUCOSE 120* 121*  BUN 28* 35*  CREATININE 0.72 0.91  CALCIUM 9.0 8.8*   PT/INR No results for input(s): LABPROT, INR in the last 72 hours.  Studies/Results: No results found.  Anti-infectives: Anti-infectives (From admission, onward)   Start     Dose/Rate Route Frequency Ordered Stop   08/22/19 1100  vancomycin (VANCOREADY) IVPB 1750 mg/350 mL  Status:  Discontinued     1,750 mg 175 mL/hr over 120 Minutes Intravenous Every 48 hours 08/20/19 1550 08/20/19 1947   08/21/19 1000  ceFEPIme (MAXIPIME) 2 g in sodium chloride 0.9 % 100 mL IVPB  Status:  Discontinued     2 g 200 mL/hr over 30 Minutes Intravenous Every 24 hours 08/20/19 1550 08/20/19 1947   08/20/19 1100  vancomycin (VANCOREADY) IVPB 2000 mg/400 mL     2,000 mg 200 mL/hr over 120 Minutes Intravenous  Once 08/20/19 1007 08/20/19 1332   08/20/19 1000  ceFEPIme (MAXIPIME) 2 g in sodium chloride 0.9 % 100 mL IVPB     2 g 200 mL/hr over 30 Minutes Intravenous  Once 08/20/19 0954 08/20/19 1342   08/20/19 1000  metroNIDAZOLE (FLAGYL) IVPB 500 mg     500 mg 100 mL/hr over 60 Minutes Intravenous  Once 08/20/19 0954 08/20/19 1332   08/20/19 1000  vancomycin (VANCOCIN) IVPB 1000 mg/200 mL premix  Status:  Discontinued     1,000 mg 200 mL/hr over 60 Minutes Intravenous  Once 08/20/19 0954 08/20/19 1007      Assessment/Plan: Impression: Bowel function seems to have returned.  I do not have an etiology for the patient's recent leukocytosis.  She clinically does not have evidence of a bowel obstruction.  Will advance to heart healthy diet.  LOS: 6 days    Franky Macho 08/26/2019

## 2019-08-27 DIAGNOSIS — I5032 Chronic diastolic (congestive) heart failure: Secondary | ICD-10-CM

## 2019-08-27 DIAGNOSIS — N179 Acute kidney failure, unspecified: Secondary | ICD-10-CM

## 2019-08-27 DIAGNOSIS — K563 Gallstone ileus: Principal | ICD-10-CM

## 2019-08-27 LAB — BASIC METABOLIC PANEL
Anion gap: 11 (ref 5–15)
BUN: 38 mg/dL — ABNORMAL HIGH (ref 8–23)
CO2: 27 mmol/L (ref 22–32)
Calcium: 8.1 mg/dL — ABNORMAL LOW (ref 8.9–10.3)
Chloride: 103 mmol/L (ref 98–111)
Creatinine, Ser: 0.98 mg/dL (ref 0.44–1.00)
GFR calc Af Amer: >60 mL/min (ref >60)
GFR calc non Af Amer: 55 mL/min — ABNORMAL LOW (ref >60)
Glucose, Bld: 109 mg/dL — ABNORMAL HIGH (ref 70–99)
Potassium: 4.2 mmol/L (ref 3.5–5.1)
Sodium: 141 mmol/L (ref 135–145)

## 2019-08-27 LAB — CBC
HCT: 39.6 % (ref 36.0–46.0)
Hemoglobin: 12.1 g/dL (ref 12.0–15.0)
MCH: 29.1 pg (ref 26.0–34.0)
MCHC: 30.6 g/dL (ref 30.0–36.0)
MCV: 95.2 fL (ref 80.0–100.0)
Platelets: 250 10*3/uL (ref 150–400)
RBC: 4.16 MIL/uL (ref 3.87–5.11)
RDW: 15.3 % (ref 11.5–15.5)
WBC: 17.7 10*3/uL — ABNORMAL HIGH (ref 4.0–10.5)
nRBC: 0 % (ref 0.0–0.2)

## 2019-08-27 LAB — SEDIMENTATION RATE: Sed Rate: 15 mm/hr (ref 0–22)

## 2019-08-27 LAB — C-REACTIVE PROTEIN: CRP: 20.6 mg/dL — ABNORMAL HIGH (ref <1.0)

## 2019-08-27 LAB — MAGNESIUM: Magnesium: 1.6 mg/dL — ABNORMAL LOW (ref 1.7–2.4)

## 2019-08-27 MED ORDER — SODIUM CHLORIDE 0.9 % IV SOLN
3.0000 g | Freq: Four times a day (QID) | INTRAVENOUS | Status: DC
  Administered 2019-08-27 – 2019-08-28 (×3): 3 g via INTRAVENOUS
  Filled 2019-08-27 (×5): qty 8
  Filled 2019-08-27: qty 3
  Filled 2019-08-27 (×3): qty 8

## 2019-08-27 MED ORDER — NYSTATIN 100000 UNIT/GM EX POWD
1.0000 "application " | Freq: Two times a day (BID) | CUTANEOUS | Status: DC
  Administered 2019-08-27 – 2019-08-28 (×2): 1 via TOPICAL
  Filled 2019-08-27: qty 15

## 2019-08-27 MED ORDER — MAGNESIUM SULFATE 2 GM/50ML IV SOLN
2.0000 g | Freq: Once | INTRAVENOUS | Status: AC
  Administered 2019-08-27: 2 g via INTRAVENOUS
  Filled 2019-08-27: qty 50

## 2019-08-27 NOTE — Progress Notes (Signed)
PROGRESS NOTE    Dawn Gonzales  YQI:[id number] DOB: Nov 16, 1940 DOA: 08/20/2019 PCP: Benita Stabile, MD   Brief Narrative:  Per HPI: Dawn Fisk Westmorelandis a 79 y.o.femalewith medical history significant forcomplete heart block with pacemaker,diastolic CHF, morbid obesity, aortic stenosis status post TAVR, RBBB. EMS was called out for generalized weakness, and low back pain. Patient was found to be hypoxic with O2 sats 80% on room air. Patient is not on home oxygen.  Patient denies difficulty breathing, has mild chronic cough that is unchanged, no wheezing. Reports some mild occasional sharp chest pains on the left side of her chest, but she has not had any chest pains today. She is unaware if she has had any leg swelling or redness, asshehas not looked ather legs. Today she has had shoulder pains. She has chronic arthritis back pain, that is unchanged from prior,For which she takes Tylenol. Denies falls. She lives alone, but her daughter comes in to check on her. She ambulates with a wheelchair. She tells me she has had multiple episodes of vomiting every day over the past few days, she has not had any vomiting today. She also reports poor p.o. intake. Last bowel movement was a few days ago,she is unsure of exactly when. She denies abdominal pain. She denies urinary frequency or pain with urination. She denies bloody stools vomiting of blood or black stools since discharge.    Assessment & Plan:   Principal Problem:   Hypoxia Active Problems:   Obesity, Class III, BMI 40-49.9 (morbid obesity) (HCC)   Sleep apnea   Hypertension   Chronic diastolic CHF (congestive heart failure), NYHA class 2 (HCC)   Gallstone ileus (HCC)   Complete heart block (HCC)   History of cardiac pacemaker   Small bowel obstruction (HCC)   Generalized weakness likely secondary to dehydration/AKI in the setting of gallstone ileus -Continue overall hydration and conservative  management as noted below -Mostly wheelchair-bound at home -PT evaluation recommending SNF which will be arranged on discharge  Leukocytosis and tachypnea -Concern for underlying pneumonia. -Remains on nasal cannula oxygen -We will start on a course of Unasyn. -Encouraged incentive spirometry -Albuterol ordered for as needed shortness of breath or wheezing  Ileus-resolving -Possibly related to gallstones, but this remains uncertain -NG tube discontinued 4/25 -Monitor electrolytes -Appreciate general surgery evaluation, she is now tolerating a solid -MiraLAX and Dulcolax suppositories ordered twice daily -She is having multiple bowel movements -We will discontinue MiraLAX for now.  AKI-resolved -Prerenal and nonoliguric -Noted to have solid mass to right kidney on CT with renal ultrasoundperformed. Patient is aware of this and should follow-up with MRI in the outpatient setting. -Urine electrolytesevaluated -Aggressive IV fluid and monitor I's and O's -Avoid nephrotoxic agents and follow a.m. labs   Acute hypoxemic respiratory failure-multifactorial -CT chest PE study is negative and full dose Lovenox had been discontinued -May very well be related more to OHS/OSA and atelectasis -Continue incentive spirometry and wean oxygen as tolerated  Hypokalemia-resolved -Recheck in a.m.  Anemia-stable -Dilutionalversus related to NG tube trauma -Resume Lovenox -Monitor a.m. CBC  Prolonged QTC -537 ms on EKG which is improved today -Continue to monitor a.m. potassium and magnesium  Chronic diastolic CHF with permanent pacemaker and TAVR -Last 2D echocardiogram 06/2019 with EF 60-65% -Monitor carefully  OSA/morbid obesity -Likely source of hypoxemia, but will plan to rule out PE today -CPAP nightly  DVT prophylaxis:Resume Lovenox Code Status:Full code Family Communication:Discussed with daughter on phone, [name] Disposition Plan:  She  is  unfortunately having some worsening tachypnea and leukocytosis which will need closer monitoring prior to discharge to SNF.  Anticipate discharge in the next 24-48 hours.   Consultants:  General Surgery  Procedures:  None  Antimicrobials:   Unasyn 4/28 >  Subjective: Patient is having multiple bowel movements today.  She denies any nausea or vomiting.  No abdominal pain.  Tolerating solid diet.  She is having occasional shortness of breath.  She denies any cough, although staff does note that she was coughing earlier today.  Objective: Vitals:   08/27/19 0406 08/27/19 1000 08/27/19 1331 08/27/19 1929  BP: 128/72 133/72 (!) 141/76   Pulse: (!) 108 98 94   Resp: (!) 24 20 16    Temp: 97.7 F (36.5 C) 99.2 F (37.3 C) 98.5 F (36.9 C)   TempSrc: Oral  Oral   SpO2: 100% 95% 97% 97%  Weight: 123.7 kg     Height:        Intake/Output Summary (Last 24 hours) at 08/27/2019 2040 Last data filed at 08/27/2019 1800 Gross per 24 hour  Intake 800 ml  Output 800 ml  Net 0 ml   Filed Weights   08/20/19 0910 08/20/19 2008 08/27/19 0406  Weight: 125.6 kg 124.2 kg 123.7 kg    Examination:  General exam: Appears calm and comfortable, morbid obesity Respiratory system: Clear to auscultation. Respiratory effort normal.  Remains on nasal cannula. Cardiovascular system: S1 & S2 heard, RRR. No JVD, murmurs, rubs, gallops or clicks. No pedal edema. Gastrointestinal system: Abdomen is nondistended, soft and nontender. No organomegaly or masses felt. Normal bowel sounds heard. Central nervous system: Alert and oriented. No focal neurological deficits. Extremities: Symmetric 5 x 5 power. Skin: No rashes, lesions or ulcers Psychiatry: Judgement and insight appear normal. Mood & affect appropriate.     Data Reviewed: I have personally reviewed following labs and imaging studies  CBC: Recent Labs  Lab 08/22/19 0512 08/24/19 0741 08/25/19 0443 08/26/19 0531 08/27/19 0507  WBC  9.6 9.7 13.0* 14.9* 17.7*  HGB 12.3 11.9* 13.4 13.1 12.1  HCT 40.2 38.9 43.2 42.7 39.6  MCV 98.3 96.3 95.4 95.1 95.2  PLT 235 241 293 273 194   Basic Metabolic Panel: Recent Labs  Lab 08/23/19 0507 08/24/19 0741 08/25/19 0443 08/26/19 0531 08/27/19 0507  NA 142 144 144 142 141  K 2.8* 3.6 3.3* 4.0 4.2  CL 101 102 103 102 103  CO2 29 29 28 28 27   GLUCOSE 79 86 120* 121* 109*  BUN 52* 35* 28* 35* 38*  CREATININE 1.17* 0.85 0.72 0.91 0.98  CALCIUM 8.4* 8.4* 9.0 8.8* 8.1*  MG 2.1 1.9 1.9 1.8 1.6*   GFR: Estimated Creatinine Clearance: 62.5 mL/min (by C-G formula based on SCr of 0.98 mg/dL). Liver Function Tests: Recent Labs  Lab 08/21/19 0542 08/22/19 0512 08/23/19 0507  AST 24 18 16   ALT 20 17 15   ALKPHOS 62 56 48  BILITOT 0.8 0.7 1.0  PROT 5.9* 5.6* 5.5*  ALBUMIN 2.8* 2.5* 2.3*   No results for input(s): LIPASE, AMYLASE in the last 168 hours. No results for input(s): AMMONIA in the last 168 hours. Coagulation Profile: No results for input(s): INR, PROTIME in the last 168 hours. Cardiac Enzymes: No results for input(s): CKTOTAL, CKMB, CKMBINDEX, TROPONINI in the last 168 hours. BNP (last 3 results) No results for input(s): PROBNP in the last 8760 hours. HbA1C: No results for input(s): HGBA1C in the last 72 hours. CBG: No results for input(s):  GLUCAP in the last 168 hours. Lipid Profile: No results for input(s): CHOL, HDL, LDLCALC, TRIG, CHOLHDL, LDLDIRECT in the last 72 hours. Thyroid Function Tests: No results for input(s): TSH, T4TOTAL, FREET4, T3FREE, THYROIDAB in the last 72 hours. Anemia Panel: No results for input(s): VITAMINB12, FOLATE, FERRITIN, TIBC, IRON, RETICCTPCT in the last 72 hours. Sepsis Labs: Recent Labs  Lab 08/26/19 1443  PROCALCITON 0.18    Recent Results (from the past 240 hour(s))  Respiratory Panel by RT PCR (Flu A&B, Covid) - Nasopharyngeal Swab     Status: None   Collection Time: 08/20/19  9:56 AM   Specimen: Nasopharyngeal  Swab  Result Value Ref Range Status   SARS Coronavirus 2 by RT PCR NEGATIVE NEGATIVE Final    Comment: (NOTE) SARS-CoV-2 target nucleic acids are NOT DETECTED. The SARS-CoV-2 RNA is generally detectable in upper respiratoy specimens during the acute phase of infection. The lowest concentration of SARS-CoV-2 viral copies this assay can detect is 131 copies/mL. A negative result does not preclude SARS-Cov-2 infection and should not be used as the sole basis for treatment or other patient management decisions. A negative result may occur with  improper specimen collection/handling, submission of specimen other than nasopharyngeal swab, presence of viral mutation(s) within the areas targeted by this assay, and inadequate number of viral copies (<131 copies/mL). A negative result must be combined with clinical observations, patient history, and epidemiological information. The expected result is Negative. Fact Sheet for Patients:  https://www.moore.com/ Fact Sheet for Healthcare Providers:  https://www.young.biz/ This test is not yet ap proved or cleared by the [place] FDA and  has been authorized for detection and/or diagnosis of SARS-CoV-2 by FDA under an Emergency Use Authorization (EUA). This EUA will remain  in effect (meaning this test can be used) for the duration of the COVID-19 declaration under Section 564(b)(1) of the Act, 21 U.S.C. section 360bbb-3(b)(1), unless the authorization is terminated or revoked sooner.    Influenza A by PCR NEGATIVE NEGATIVE Final   Influenza B by PCR NEGATIVE NEGATIVE Final    Comment: (NOTE) The Xpert Xpress SARS-CoV-2/FLU/RSV assay is intended as an aid in  the diagnosis of influenza from Nasopharyngeal swab specimens and  should not be used as a sole basis for treatment. Nasal washings and  aspirates are unacceptable for Xpert Xpress SARS-CoV-2/FLU/RSV  testing. Fact Sheet for  Patients: https://www.moore.com/ Fact Sheet for Healthcare Providers: https://www.young.biz/ This test is not yet approved or cleared by the [place] FDA and  has been authorized for detection and/or diagnosis of SARS-CoV-2 by  FDA under an Emergency Use Authorization (EUA). This EUA will remain  in effect (meaning this test can be used) for the duration of the  Covid-19 declaration under Section 564(b)(1) of the Act, 21  U.S.C. section 360bbb-3(b)(1), unless the authorization is  terminated or revoked. Performed at [facility], [address], [place], [place] [address]   Blood Culture (routine x 2)     Status: None   Collection Time: 08/20/19 10:47 AM   Specimen: Blood  Result Value Ref Range Status   Specimen Description BLOOD LEFT HAND DRAWN BY RN  Final   Special Requests   Final    BOTTLES DRAWN AEROBIC AND ANAEROBIC Blood Culture adequate volume   Culture   Final    NO GROWTH 5 DAYS Performed at [facility], [address] [place], [place] [address]    Report Status 08/25/2019 FINAL  Final  Blood Culture (routine x 2)  Status: None   Collection Time: 08/20/19 10:48 AM   Specimen: Blood  Result Value Ref Range Status   Specimen Description BLOOD RIGHT HAND DRAWN BY RN  Final   Special Requests   Final    BOTTLES DRAWN AEROBIC AND ANAEROBIC Blood Culture results may not be optimal due to an inadequate volume of blood received in culture bottles   Culture   Final    NO GROWTH 5 DAYS Performed at [facility], [address] [place], [place] [address]    Report Status 08/25/2019 FINAL  Final  Urine culture     Status: Abnormal   Collection Time: 08/20/19  1:49 PM   Specimen: Urine, Catheterized  Result Value Ref Range Status   Specimen Description   Final    URINE, CATHETERIZED Performed at [facility], [address] [place], [place] [address]    Special Requests   Final    NONE Performed at [facility], [address], [place], [place] [address]    Culture (A)  Final    >=100,000 COLONIES/mL LACTOBACILLUS SPECIES Standardized susceptibility testing for this organism is not available. Performed at [facility], [address], [place], [place] [address]    Report Status 08/22/2019 FINAL  Final         Radiology Studies: DG Chest Port 1 View  Result Date: 08/26/2019 CLINICAL DATA:  Short of breath EXAM: PORTABLE CHEST 1 VIEW COMPARISON:  08/24/2019 FINDINGS: Postop TAVR. Dual lead pacemaker unchanged. NG tube is been removed Bibasilar airspace disease left greater than right is unchanged. Negative for edema. IMPRESSION: Bibasilar airspace disease left greater than right unchanged. Possible atelectasis versus pneumonia. Electronically Signed   By: Marlan Palau M.D.   On: 08/26/2019 14:33        Scheduled Meds: . bisacodyl  10 mg Rectal BID  . enoxaparin (LOVENOX) injection  60 mg Subcutaneous Q24H  . gabapentin  300 mg Oral TID  . nystatin  1 application Topical BID  . pantoprazole (PROTONIX) IV  40 mg Intravenous Q24H  . venlafaxine  37.5 mg Oral Daily   Continuous Infusions: . ampicillin-sulbactam (UNASYN) IV       LOS: 7 days    Time spent: 35 minutes    Erick Blinks, MD [facility]  If 7PM-7AM, please contact night-coverage [email or web address] 08/27/2019, 8:40 PM

## 2019-08-27 NOTE — Care Management Important Message (Signed)
Important Message  Patient Details  Name: Dawn Gonzales MRN: [id number] Date of Birth: Aug 13, 1940   Medicare Important Message Given:  Yes     Corey Harold 08/27/2019, 4:19 PM

## 2019-08-27 NOTE — Progress Notes (Signed)
Patients MEWS score continues to be 3.  On-call MD notified that it continues to be high.  Has a C-Pap order that might help with the score but patient refuses to wear.  Will continue vitals every 4 hours and continue to monitor.

## 2019-08-27 NOTE — Progress Notes (Signed)
Patient ID: Dawn Gonzales, female   DOB: Sep 10, 1940, 79 y.o.   MRN: [id number]    Subjective: Dawn Gonzales was seen this morning and reports having bowel movements and passing flatus. She reports having abdominal pain only when moving around. She denies any significant vomiting. She has been tolerating heart healthy diet well.   Objective: Vital signs in last 24 hours: Temp:  [97.7 F (36.5 C)-99 F (37.2 C)] 97.7 F (36.5 C) (04/28 0406) Pulse Rate:  [106-108] 108 (04/28 0406) Resp:  [22-32] 24 (04/28 0406) BP: (128-155)/(72-95) 128/72 (04/28 0406) SpO2:  [96 %-100 %] 100 % (04/28 0406) Weight:  [123.7 kg] 123.7 kg (04/28 0406) Last BM Date: 08/25/19  Intake/Output from previous day: 04/27 0701 - 04/28 0700 In: 480 [P.O.:480] Out: -  Intake/Output this shift: No intake/output data recorded.  Physical Exam Abdominal:     General: Bowel sounds are decreased.     Palpations: Abdomen is soft.     Tenderness: There is no abdominal tenderness.  Skin:    General: Skin is warm and dry.  Neurological:     Mental Status: She is alert.    Lab Results:  Recent Labs    08/26/19 0531 08/27/19 0507  WBC 14.9* 17.7*  HGB 13.1 12.1  HCT 42.7 39.6  PLT 273 250   BMET Recent Labs    08/26/19 0531 08/27/19 0507  NA 142 141  K 4.0 4.2  CL 102 103  CO2 28 27  GLUCOSE 121* 109*  BUN 35* 38*  CREATININE 0.91 0.98  CALCIUM 8.8* 8.1*   PT/INR No results for input(s): LABPROT, INR in the last 72 hours.  Studies/Results: DG Chest Port 1 View  Result Date: 08/26/2019 CLINICAL DATA:  Short of breath EXAM: PORTABLE CHEST 1 VIEW COMPARISON:  08/24/2019 FINDINGS: Postop TAVR. Dual lead pacemaker unchanged. NG tube is been removed Bibasilar airspace disease left greater than right is unchanged. Negative for edema. IMPRESSION: Bibasilar airspace disease left greater than right unchanged. Possible atelectasis versus pneumonia. Electronically Signed   By: Marlan Palau  M.D.   On: 08/26/2019 14:33    Anti-infectives: Anti-infectives (From admission, onward)   Start     Dose/Rate Route Frequency Ordered Stop   08/22/19 1100  vancomycin (VANCOREADY) IVPB 1750 mg/350 mL  Status:  Discontinued     1,750 mg 175 mL/hr over 120 Minutes Intravenous Every 48 hours 08/20/19 1550 08/20/19 1947   08/21/19 1000  ceFEPIme (MAXIPIME) 2 g in sodium chloride 0.9 % 100 mL IVPB  Status:  Discontinued     2 g 200 mL/hr over 30 Minutes Intravenous Every 24 hours 08/20/19 1550 08/20/19 1947   08/20/19 1100  vancomycin (VANCOREADY) IVPB 2000 mg/400 mL     2,000 mg 200 mL/hr over 120 Minutes Intravenous  Once 08/20/19 1007 08/20/19 1332   08/20/19 1000  ceFEPIme (MAXIPIME) 2 g in sodium chloride 0.9 % 100 mL IVPB     2 g 200 mL/hr over 30 Minutes Intravenous  Once 08/20/19 0954 08/20/19 1342   08/20/19 1000  metroNIDAZOLE (FLAGYL) IVPB 500 mg     500 mg 100 mL/hr over 60 Minutes Intravenous  Once 08/20/19 0954 08/20/19 1332   08/20/19 1000  vancomycin (VANCOCIN) IVPB 1000 mg/200 mL premix  Status:  Discontinued     1,000 mg 200 mL/hr over 60 Minutes Intravenous  Once 08/20/19 0954 08/20/19 1007      Assessment/Plan: Dawn Gonzales has been improving and is tolerating a heart healthy diet. She  is passing flatus and having bowel movements. She has no clinical evidence of small bowel obstruction and bowel function appears to have returned. She continues to have an increased white cell count, but is clinically improving and stable for discharge to SNF.  - stable for discharge to SNF today  - ileus is resolving; no need for surgical intervention at this time   LOS: 7 days    Bartholome Bill 08/27/2019

## 2019-08-27 NOTE — Progress Notes (Signed)
Patient is refusing the use of CPAP at this time. RT informed patient if she changes her mind have RN contact RT. 

## 2019-08-28 ENCOUNTER — Ambulatory Visit: Payer: Medicare PPO | Admitting: General Surgery

## 2019-08-28 DIAGNOSIS — Z7401 Bed confinement status: Secondary | ICD-10-CM | POA: Diagnosis not present

## 2019-08-28 DIAGNOSIS — F329 Major depressive disorder, single episode, unspecified: Secondary | ICD-10-CM | POA: Diagnosis not present

## 2019-08-28 DIAGNOSIS — G4730 Sleep apnea, unspecified: Secondary | ICD-10-CM | POA: Diagnosis not present

## 2019-08-28 DIAGNOSIS — I639 Cerebral infarction, unspecified: Secondary | ICD-10-CM | POA: Diagnosis not present

## 2019-08-28 DIAGNOSIS — R5381 Other malaise: Secondary | ICD-10-CM | POA: Diagnosis not present

## 2019-08-28 DIAGNOSIS — K563 Gallstone ileus: Secondary | ICD-10-CM | POA: Diagnosis not present

## 2019-08-28 DIAGNOSIS — F339 Major depressive disorder, recurrent, unspecified: Secondary | ICD-10-CM | POA: Diagnosis not present

## 2019-08-28 DIAGNOSIS — J9601 Acute respiratory failure with hypoxia: Secondary | ICD-10-CM | POA: Diagnosis not present

## 2019-08-28 DIAGNOSIS — J189 Pneumonia, unspecified organism: Secondary | ICD-10-CM | POA: Diagnosis not present

## 2019-08-28 DIAGNOSIS — I5031 Acute diastolic (congestive) heart failure: Secondary | ICD-10-CM | POA: Diagnosis not present

## 2019-08-28 DIAGNOSIS — R0902 Hypoxemia: Secondary | ICD-10-CM | POA: Diagnosis not present

## 2019-08-28 DIAGNOSIS — G894 Chronic pain syndrome: Secondary | ICD-10-CM | POA: Diagnosis not present

## 2019-08-28 DIAGNOSIS — I10 Essential (primary) hypertension: Secondary | ICD-10-CM | POA: Diagnosis not present

## 2019-08-28 DIAGNOSIS — I350 Nonrheumatic aortic (valve) stenosis: Secondary | ICD-10-CM | POA: Diagnosis not present

## 2019-08-28 DIAGNOSIS — R69 Illness, unspecified: Secondary | ICD-10-CM | POA: Diagnosis not present

## 2019-08-28 DIAGNOSIS — G629 Polyneuropathy, unspecified: Secondary | ICD-10-CM | POA: Diagnosis not present

## 2019-08-28 DIAGNOSIS — I5032 Chronic diastolic (congestive) heart failure: Secondary | ICD-10-CM | POA: Diagnosis not present

## 2019-08-28 DIAGNOSIS — S36498D Other injury of other part of small intestine, subsequent encounter: Secondary | ICD-10-CM | POA: Diagnosis not present

## 2019-08-28 DIAGNOSIS — J9621 Acute and chronic respiratory failure with hypoxia: Secondary | ICD-10-CM | POA: Diagnosis not present

## 2019-08-28 DIAGNOSIS — D649 Anemia, unspecified: Secondary | ICD-10-CM | POA: Diagnosis not present

## 2019-08-28 DIAGNOSIS — N179 Acute kidney failure, unspecified: Secondary | ICD-10-CM | POA: Diagnosis not present

## 2019-08-28 LAB — CBC
HCT: 36 % (ref 36.0–46.0)
Hemoglobin: 11 g/dL — ABNORMAL LOW (ref 12.0–15.0)
MCH: 29.2 pg (ref 26.0–34.0)
MCHC: 30.6 g/dL (ref 30.0–36.0)
MCV: 95.5 fL (ref 80.0–100.0)
Platelets: 234 10*3/uL (ref 150–400)
RBC: 3.77 MIL/uL — ABNORMAL LOW (ref 3.87–5.11)
RDW: 15.1 % (ref 11.5–15.5)
WBC: 11.2 10*3/uL — ABNORMAL HIGH (ref 4.0–10.5)
nRBC: 0 % (ref 0.0–0.2)

## 2019-08-28 LAB — BASIC METABOLIC PANEL
Anion gap: 8 (ref 5–15)
BUN: 31 mg/dL — ABNORMAL HIGH (ref 8–23)
CO2: 27 mmol/L (ref 22–32)
Calcium: 7.8 mg/dL — ABNORMAL LOW (ref 8.9–10.3)
Chloride: 105 mmol/L (ref 98–111)
Creatinine, Ser: 0.75 mg/dL (ref 0.44–1.00)
GFR calc Af Amer: >60 mL/min (ref >60)
GFR calc non Af Amer: >60 mL/min (ref >60)
Glucose, Bld: 99 mg/dL (ref 70–99)
Potassium: 3.5 mmol/L (ref 3.5–5.1)
Sodium: 140 mmol/L (ref 135–145)

## 2019-08-28 MED ORDER — AMOXICILLIN-POT CLAVULANATE 875-125 MG PO TABS: 1.0000 | ORAL_TABLET | Freq: Two times a day (BID) | ORAL | Status: DC

## 2019-08-28 MED ORDER — AMOXICILLIN-POT CLAVULANATE 875-125 MG PO TABS
1.0000 | ORAL_TABLET | Freq: Two times a day (BID) | ORAL | Status: DC
  Administered 2019-08-28: 1 via ORAL
  Filled 2019-08-28: qty 1

## 2019-08-28 MED ORDER — ALBUTEROL SULFATE (2.5 MG/3ML) 0.083% IN NEBU: 2.5000 mg | INHALATION_SOLUTION | Freq: Four times a day (QID) | RESPIRATORY_TRACT | 12 refills | Status: DC | PRN

## 2019-08-28 MED ORDER — TRAMADOL HCL 50 MG PO TABS: 50.0000 mg | ORAL_TABLET | Freq: Four times a day (QID) | ORAL | 0 refills | Status: DC | PRN

## 2019-08-28 NOTE — TOC Transition Note (Addendum)
Transition of Care [facility]) - CM/SW Discharge Note   Patient Details  Name: Dawn Gonzales MRN: [id number] Date of Birth: [birth year]  Transition of Care [facility]) CM/SW Contact:  Beyza Bellino, Chrystine Oiler, RN Phone Number: 08/28/2019, 4:15 PM   Clinical Narrative:   Insurance authorization received. Care Coordinator is [employer]. Ref # is [id number]. Patient is going to [facility]. Bedside RN to call report. EMS arranged. Daughter notified.     Final next level of care: Skilled Nursing Facility Barriers to Discharge: Barriers Resolved   Patient Goals and CMS Choice Patient states their goals for this hospitalization and ongoing recovery are:: to go to SNF then home. CMS Medicare.gov Compare Post Acute Care list provided to:: Patient Represenative (must comment) Choice offered to / list presented to : Adult Children  Discharge Placement              Patient chooses bed at: [facility] Patient to be transferred to facility by: EMS Name of family member notified: Huntley Dec Patient and family notified of of transfer: 08/28/19  Social Determinants of Health (SDOH) Interventions     Readmission Risk Interventions No flowsheet data found.

## 2019-08-28 NOTE — Progress Notes (Signed)
It is now 0739 and attempted to call report to [facility] for the second time.  The first time was before patient arrived at [facility] and the nurse was going to call me back and didn't.  The second attempt was just now and was put on hold for 5 minutes with no answer.

## 2019-08-28 NOTE — TOC Progression Note (Signed)
DiTransition of Care [facility]) - Progression Note    Patient Details  Name: Dawn Gonzales MRN: [id number] Date of Birth: [birth year]  Transition of Care [facility]) CM/SW Contact  Jaquarious Grey, Chrystine Oiler, RN Phone Number: 08/28/2019, 11:58 AM  Clinical Narrative:    Discussed bed offer with patient and daughter. Both agreeable to [facility]. Will notify [employer] of bed choice and proceed with insurance authorization.    Expected Discharge Plan: Skilled Nursing Facility Barriers to Discharge: Continued Medical Work up  Expected Discharge Plan and Services Expected Discharge Plan: Skilled Nursing Facility          Social Determinants of Health (SDOH) Interventions    Readmission Risk Interventions No flowsheet data found.

## 2019-08-28 NOTE — Discharge Summary (Signed)
Physician Discharge Summary  Dawn Gonzales:[id number] DOB: November 18, 1940 DOA: 08/20/2019  PCP: Benita Stabile, MD  Admit date: 08/20/2019 Discharge date: 08/28/2019  Admitted From: Home Disposition: Skilled nursing facility  Recommendations for Outpatient Follow-up:  1. Follow up with PCP in 1-2 weeks 2. Please obtain BMP/CBC in one week 3. Consider outpatient MRI to further evaluate right kidney mass.  Discharge Condition: Stable CODE STATUS: Full code Diet recommendation: Heart healthy  Brief/Interim Summary: Per HPI: Dawn Wilton Westmorelandis a 79 y.o.femalewith medical history significant forcomplete heart block with pacemaker,diastolic CHF, morbid obesity, aortic stenosis status post TAVR, RBBB. EMS was called out for generalized weakness, and low back pain. Patient was found to be hypoxic with O2 sats 80% on room air. Patient is not on home oxygen.  Patient denies difficulty breathing, has mild chronic cough that is unchanged, no wheezing. Reports some mild occasional sharp chest pains on the left side of her chest, but she has not had any chest pains today. She is unaware if she has had any leg swelling or redness, asshehas not looked ather legs. Today she has had shoulder pains. She has chronic arthritis back pain, that is unchanged from prior,For which she takes Tylenol. Denies falls. She lives alone, but her daughter comes in to check on her. She ambulates with a wheelchair. She tells me she has had multiple episodes of vomiting every day over the past few days, she has not had any vomiting today. She also reports poor p.o. intake. Last bowel movement was a few days ago,she is unsure of exactly when. She denies abdominal pain. She denies urinary frequency or pain with urination. She denies bloody stools vomiting of blood or black stools since discharge.  Discharge Diagnoses:  Principal Problem:   Hypoxia Active Problems:   Obesity, Class III, BMI  40-49.9 (morbid obesity) (HCC)   Sleep apnea   Hypertension   Chronic diastolic CHF (congestive heart failure), NYHA class 2 (HCC)   Gallstone ileus (HCC)   Complete heart block (HCC)   History of cardiac pacemaker   Small bowel obstruction (HCC)  Generalized weakness likely secondary to dehydration/AKI in the setting of gallstone ileus -Continue overall hydration and conservative management as noted below -Mostly wheelchair-bound at home -PT evaluation recommending SNF which will be arranged on discharge  Leukocytosis and tachypnea -Concern for underlying pneumonia. -Remains on nasal cannula oxygen -started on unasyn with improvement of leukocytosis. -she is afebrile, will transition to augmentin to complete her course -respiratory status is currently stable and she is breathing comfortably -Encouraged incentive spirometry -Albuterol ordered for as needed shortness of breath or wheezing  Ileus-resolving -Possibly related to gallstones, but this remains uncertain -NG tube discontinued 4/25 -Monitor electrolytes -Appreciate general surgery evaluation, she is now tolerating a solid diet -MiraLAX and Dulcolax suppositories ordered twice daily -will change miralax to prn -if this does not have adequate effect, would consider dulcolax suppositories  AKI-resolved -Prerenal and nonoliguric -Noted to have solid mass to right kidney on CT with renal ultrasoundperformed. Patient is aware of this and should follow-up with MRI in the outpatient setting. -Urine electrolytesevaluated -Aggressive IV fluid and monitor I's and O's -Avoid nephrotoxic agents and follow a.m. labs -creatinine currently stable at 0.7   Acute hypoxemic respiratory failure-multifactorial -CT chest PE study is negative  -May very well be related more to OHS/OSA and atelectasis -Continue incentive spirometry and wean oxygen as tolerated  Hypokalemia -resolved with  replacement  Anemia-stable -Dilutionalversus related to NG tube trauma -Hgb currently  stable at 11  Prolonged QTC -537 ms on EKG which is improved with correction of electrolytes  Chronic diastolic CHF with permanent pacemaker and TAVR -Last 2D echocardiogram 06/2019 with EF 60-65% -Monitor carefully   Discharge Instructions  Discharge Instructions    Diet - low sodium heart healthy   Complete by: As directed    Increase activity slowly   Complete by: As directed      Allergies as of 08/28/2019   No Known Allergies     Medication List    STOP taking these medications   ondansetron 4 MG tablet Commonly known as: ZOFRAN     TAKE these medications   acetaminophen 325 MG tablet Commonly known as: TYLENOL Take 2 tablets (650 mg total) by mouth every 6 (six) hours as needed for mild pain, fever or headache (or Fever >/= 101).   albuterol (2.5 MG/3ML) 0.083% nebulizer solution Commonly known as: PROVENTIL Take 3 mLs (2.5 mg total) by nebulization every 6 (six) hours as needed for wheezing or shortness of breath.   amoxicillin-clavulanate 875-125 MG tablet Commonly known as: AUGMENTIN Take 1 tablet by mouth every 12 (twelve) hours. For 3 days   aspirin EC 81 MG tablet Take 1 tablet (81 mg total) by mouth daily with breakfast.   CALCIUM-CARB 600 PO Take 600 mg by mouth daily.   cholecalciferol 1000 units tablet Commonly known as: VITAMIN D Take 1,000 Units by mouth daily.   ferrous sulfate 325 (65 FE) MG tablet Take 325 mg by mouth daily with breakfast.   gabapentin 300 MG capsule Commonly known as: NEURONTIN Take 300 mg by mouth 3 (three) times daily.   metoCLOPramide 10 MG tablet Commonly known as: Reglan Take 1 tablet (10 mg total) by mouth every 6 (six) hours as needed for nausea (nausea/headache).   multivitamin with minerals Tabs tablet Take 1 tablet by mouth daily.   ondansetron 4 MG disintegrating tablet Commonly known as: Zofran ODT 4mg  ODT  q4 hours prn nausea/vomit   pantoprazole 40 MG tablet Commonly known as: Protonix Take 1 tablet (40 mg total) by mouth daily. What changed: when to take this   polyethylene glycol 17 g packet Commonly known as: MiraLax Mix-In Pax Take 17 g by mouth daily as needed for mild constipation or moderate constipation.   pravastatin 20 MG tablet Commonly known as: PRAVACHOL Take 1 tablet (20 mg total) by mouth at bedtime.   silver sulfADIAZINE 1 % cream Commonly known as: SILVADENE Apply 1 application topically 2 (two) times daily.   traMADol 50 MG tablet Commonly known as: ULTRAM Take 1 tablet (50 mg total) by mouth every 6 (six) hours as needed for moderate pain.   venlafaxine 37.5 MG tablet Commonly known as: EFFEXOR Take 37.5 mg by mouth daily.   vitamin C 500 MG tablet Commonly known as: ASCORBIC ACID Take 500 mg by mouth daily.      Contact information for after-discharge care    Destination    [facility] Preferred SNF .   Service: Skilled Nursing Contact information: [address] [place] [address] [phone]             No Known Allergies  Consultations:  General surgery   Procedures/Studies: CT Abdomen Pelvis Wo Contrast  Result Date: 08/20/2019 CLINICAL DATA:  Weakness with worsening low back pain. Awaiting gallbladder surgery for cholecystitis. Shortness of breath. EXAM: CT CHEST, ABDOMEN AND PELVIS WITHOUT CONTRAST TECHNIQUE: Multidetector CT imaging of the chest, abdomen and pelvis  was performed following the standard protocol without IV contrast. COMPARISON:  Abdominopelvic CT 08/16/2019 and 08/12/2019. Chest CTA 05/30/2018. FINDINGS: CT CHEST FINDINGS Cardiovascular: Mild diffuse atherosclerosis of the aorta, great vessels and coronary arteries. No acute vascular findings on noncontrast imaging. Left subclavian pacemaker leads extend into the right atrium and right ventricle. 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 Mitral annular  calcifications are noted. No significant pericardial fluid. Mediastinum/Nodes: There are no enlarged mediastinal, hilar or axillary lymph nodes. The thyroid gland, trachea and esophagus demonstrate no significant findings. Lungs/Pleura: Trace dependent left pleural effusion. No pneumothorax. Atelectasis at both lung bases has mildly progressed from the recent abdominal CT. No confluent airspace opacity or significant pulmonary nodularity. Musculoskeletal/Chest wall: No chest wall mass or acute osseous findings. Multilevel thoracic spondylosis. Advanced glenohumeral arthropathy bilaterally. CT ABDOMEN AND PELVIS FINDINGS Hepatobiliary: The liver appears stable as imaged in the noncontrast state. Again demonstrated is diffuse pneumobilia with gas in the gallbladder lumen. There is mild gallbladder wall thickening and surrounding inflammatory change. 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 Pancreas: Atrophy. No pancreatic ductal dilatation or focal surrounding inflammatory change. Spleen: Normal in size without focal abnormality. Adrenals/Urinary Tract: Stable mild thickening of the adrenal glands without suspicious findings. Mild bilateral renal cortical thinning with stable exophytic renal lesions bilaterally. Of note, suspected solid lesion in the lower interpolar region of the right kidney is not well characterized on the current study due to lack of contrast. No hydronephrosis. The bladder appears unremarkable for its degree of distention. Stomach/Bowel: The stomach is now markedly distended with fluid. In addition, there is at least moderate proximal to mid small bowel dilatation with scattered air-fluid levels consistent with a distal small bowel obstruction. There appear to be a few loops of distal decompressed small bowel, although the specific transition point is difficult to identify. Based on the prior study and  clinical likelihood of gallstone ileus, there may be an obstructing gallstone in the distal small bowel (axial image 65/2 and coronal image 74/5), although this is not definitive. The colon is decompressed. Vascular/Lymphatic: There are no enlarged abdominal or pelvic lymph nodes. Aortic and branch vessel atherosclerosis. Reproductive: The uterus and ovaries appear normal. No adnexal mass. Other: Mild central mesenteric edema without focal extraluminal fluid collection. No free intraperitoneal air. No evidence of abdominal wall hernia. Musculoskeletal: No acute or significant osseous findings. Multilevel lumbar spondylosis. Mild edema throughout the subcutaneous fat. IMPRESSION: 1. Findings are consistent with a high-grade distal small bowel obstruction, likely from an obstructing gallstone in the distal small bowel (gallstone ileus). 2. The stomach is now markedly distended with fluid, and the patient may benefit from nasogastric tube decompression. No evidence of bowel perforation or abscess. 3. 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 4. Mildly progressed bibasilar atelectasis. Trace dependent left pleural effusion. 5. Aortic Atherosclerosis (ICD10-I70.0). Electronically Signed   By: Carey Bullocks M.D.   On: 08/20/2019 13:56   CT ABDOMEN PELVIS WO CONTRAST  Result Date: 08/13/2019 CLINICAL DATA:  Right upper quadrant pain with nausea and vomiting EXAM: CT ABDOMEN AND PELVIS WITHOUT CONTRAST TECHNIQUE: Multidetector CT imaging of the abdomen and pelvis was performed following the standard protocol without IV contrast. COMPARISON:  CT 07/08/2019, 07/04/2019 MRI 02/06/2018, 09/18/2018 FINDINGS: Lower chest: Lung bases demonstrate no acute consolidation or pleural effusion. Cardiomegaly. Aortic valve stent graft. Partially visualized intracardiac pacing leads. Hepatobiliary: No focal hepatic abnormality. Redemonstrated  pneumobilia. Thick walled gallbladder with air in the lumen as before. Mild soft tissue stranding in the region. Pancreas: Unremarkable. No pancreatic ductal dilatation or surrounding inflammatory changes. Spleen: Normal in size without focal abnormality. Adrenals/Urinary Tract: Adrenal glands are within normal limits. Kidneys show no hydronephrosis. Indeterminate lesion off the lower pole right kidney better seen with contrast. Cyst in the midpole on the left. The bladder is unremarkable Stomach/Bowel: The stomach is nonenlarged. Wall thickening of the duodenum with surrounding inflammatory process. Suspected fistula between the duodenal bulb and the gallbladder, series 2, image number 29. Lamellated appearing density within the duodenal bulb measuring 2.7 cm suspected to represent a gallstone. The small bowel distal to this is nondilated. Negative appendix. No colon wall thickening. Vascular/Lymphatic: Moderate aortic atherosclerosis without aneurysm. No suspicious adenopathy. Reproductive: Uterus and bilateral adnexa are unremarkable. Other: Negative for free air or free fluid. Musculoskeletal: Advanced degenerative changes. No acute or suspicious osseous abnormality IMPRESSION: 1. Redemonstrated pneumobilia with air in the gallbladder, gallbladder wall thickening and surrounding inflammatory changes in the right upper quadrant. 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 There is duodenal wall thickening and inflammatory change. No free air. 2. Indeterminate right renal lesion previously evaluated, refer to prior MRI for follow-up recommendations. Electronically Signed   By: Jasmine Pang M.D.   On: 08/13/2019 00:08   CT Chest Wo Contrast  Result Date: 08/20/2019 CLINICAL DATA:  Weakness with worsening low back pain. Awaiting gallbladder surgery for cholecystitis. Shortness of breath. EXAM: CT CHEST, ABDOMEN AND PELVIS WITHOUT  CONTRAST TECHNIQUE: Multidetector CT imaging of the chest, abdomen and pelvis was performed following the standard protocol without IV contrast. COMPARISON:  Abdominopelvic CT 08/16/2019 and 08/12/2019. Chest CTA 05/30/2018. FINDINGS: CT CHEST FINDINGS Cardiovascular: Mild diffuse atherosclerosis of the aorta, great vessels and coronary arteries. No acute vascular findings on noncontrast imaging. Left subclavian pacemaker leads extend into the right atrium and right ventricle. 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 Mitral annular calcifications are noted. No significant pericardial fluid. Mediastinum/Nodes: There are no enlarged mediastinal, hilar or axillary lymph nodes. The thyroid gland, trachea and esophagus demonstrate no significant findings. Lungs/Pleura: Trace dependent left pleural effusion. No pneumothorax. Atelectasis at both lung bases has mildly progressed from the recent abdominal CT. No confluent airspace opacity or significant pulmonary nodularity. Musculoskeletal/Chest wall: No chest wall mass or acute osseous findings. Multilevel thoracic spondylosis. Advanced glenohumeral arthropathy bilaterally. CT ABDOMEN AND PELVIS FINDINGS Hepatobiliary: The liver appears stable as imaged in the noncontrast state. Again demonstrated is diffuse pneumobilia with gas in the gallbladder lumen. There is mild gallbladder wall thickening and surrounding inflammatory change. 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 Pancreas: Atrophy. No pancreatic ductal dilatation or focal surrounding inflammatory change. Spleen: Normal in size without focal abnormality. Adrenals/Urinary Tract: Stable mild thickening of the adrenal glands without suspicious findings. Mild bilateral renal cortical thinning with stable exophytic renal lesions bilaterally. Of note, suspected solid lesion in the lower interpolar region of the right kidney is not well  characterized on the current study due to lack of contrast. No hydronephrosis. The bladder appears unremarkable for its degree of distention. Stomach/Bowel: The stomach is now markedly distended with fluid. In addition, there is at least moderate proximal to mid small bowel dilatation with scattered air-fluid levels consistent with a distal small bowel obstruction. There appear to be a few loops of distal decompressed small bowel, although the  specific transition point is difficult to identify. Based on the prior study and clinical likelihood of gallstone ileus, there may be an obstructing gallstone in the distal small bowel (axial image 65/2 and coronal image 74/5), although this is not definitive. The colon is decompressed. Vascular/Lymphatic: There are no enlarged abdominal or pelvic lymph nodes. Aortic and branch vessel atherosclerosis. Reproductive: The uterus and ovaries appear normal. No adnexal mass. Other: Mild central mesenteric edema without focal extraluminal fluid collection. No free intraperitoneal air. No evidence of abdominal wall hernia. Musculoskeletal: No acute or significant osseous findings. Multilevel lumbar spondylosis. Mild edema throughout the subcutaneous fat. IMPRESSION: 1. Findings are consistent with a high-grade distal small bowel obstruction, likely from an obstructing gallstone in the distal small bowel (gallstone ileus). 2. The stomach is now markedly distended with fluid, and the patient may benefit from nasogastric tube decompression. No evidence of bowel perforation or abscess. 3. 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 4. Mildly progressed bibasilar atelectasis. Trace dependent left pleural effusion. 5. Aortic Atherosclerosis (ICD10-I70.0). Electronically Signed   By: Carey Bullocks M.D.   On: 08/20/2019 13:56   CT ANGIO CHEST PE W OR WO CONTRAST  Result Date: 08/23/2019 CLINICAL DATA:   Indeterminate V/Q scan. Evaluate for pulmonary embolism. EXAM: CT ANGIOGRAPHY CHEST WITH CONTRAST TECHNIQUE: Multidetector CT imaging of the chest was performed using the standard protocol during bolus administration of intravenous contrast. Multiplanar CT image reconstructions and MIPs were obtained to evaluate the vascular anatomy. CONTRAST:  80mL OMNIPAQUE IOHEXOL 350 MG/ML SOLN COMPARISON:  Nuclear medicine V/Q scan-08/20/2019; chest CT-08/20/2019; 06/03/2018; CT abdomen pelvis-08/20/2019 FINDINGS: Vascular Findings: There is adequate opacification of the pulmonary arterial system with the main pulmonary artery measuring 246 Hounsfield units. There are no discrete filling defects within the pulmonary arterial tree to suggest pulmonary embolism. Enlarged caliber of the main pulmonary artery measuring 3.9 cm in diameter (image 37, series 4). Cardiomegaly. Pacer leads terminate within the right atrium and ventricle. Calcifications about the mitral valve annulus. Post percutaneous aortic valve replacement. 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 Review of the MIP images confirms the above findings. ---------------------------------------------------------------------------------- Nonvascular Findings: Mediastinum/Lymph Nodes: No bulky mediastinal, hilar axillary lymphadenopathy. Lungs/Pleura: Worsening left basilar atelectasis/collapse. Ill-defined areas of ground-glass within the bilateral upper lobes is favored to represent areas of air trapping. Potential trace left-sided pleural effusion. No pneumothorax. Central pulmonary airways appear patent. Upper abdomen: Limited early arterial phase evaluation of the upper abdomen demonstrates nasogastric tube within the gastric fundus, tip excluded from view. 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 Musculoskeletal: No acute or aggressive osseous abnormalities. Severe degenerative change of the bilateral glenohumeral joints, right greater than left. Stigmata of dish within the thoracic spine. IMPRESSION: 1. No evidence of pulmonary embolism. 2. Worsening left lower lobe atelectasis/collapse. 3. Cardiomegaly with ill-defined areas of ground-glass within the bilateral upper lobes, potentially air trapping though early/mild pulmonary edema could have a similar appearance. Clinical correlation is advised. 4. 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 Recommend annual imaging followup by CTA or MRA. This recommendation follows 2010 ACCF/AHA/AATS/ACR/ASA/SCA/SCAI/SIR/STS/SVM Guidelines for the Diagnosis and Management of Patients with Thoracic Aortic Disease. Circulation. 2010; 121: M578-I696. Aortic aneurysm NOS (ICD10-I71.9) 5. Otherwise, stable ancillary findings as detailed above. Electronically  Signed   By: Simonne Come M.D.   On: 08/23/2019 14:08   CT ABDOMEN PELVIS W CONTRAST  Result Date: 08/16/2019 CLINICAL DATA:  Abdominal distension, bowel obstruction suspected 0 mic EXAM: CT ABDOMEN AND PELVIS WITH CONTRAST TECHNIQUE: Multidetector CT imaging of the abdomen and pelvis was performed using the standard protocol following bolus administration of intravenous contrast. CONTRAST:  OMNIPAQUE IOHEXOL 300 MG/ML  SOLN COMPARISON:  CT 08/12/2019 FINDINGS: Lower chest: Atelectatic changes are present in the lung bases. No effusion or consolidative opacity. Mild cardiomegaly with dense mitral annular calcifications. Transcatheter aortic valve replacement is in expected stable position from the comparison CT. Cardiac pacer leads are positioned at the right atrium and cardiac apex. There is three-vessel coronary artery calcification. No pericardial effusion. Hepatobiliary: There is extensive pneumobilia  throughout the anti dependent portions of the liver. No focal liver lesion. Smooth liver surface contour and normal hepatic attenuation. The gallbladder is air-filled and diffusely thickened with pericholecystic stranding. 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 Gas is also seen within the distended extrahepatic biliary tree and cystic duct. A hypoattenuating, slightly lamellated structure seen in the region of the duodenal bulb (6/37) is suggestive of a gallstone within the duodenal lumen. Pancreas: Partial fatty replacement of the pancreas. No focal peripancreatic inflammation or ductal dilatation. Spleen: Normal splenic size. Punctate calcifications likely reflect sequela prior granulomatous disease. Small accessory splenule towards the splenic hilum. Adrenals/Urinary Tract: Nodular thickening of the adrenal glands is similar to comparison exam without worrisome dominant nodule. There is a thick walled solid/cystic appearing lesion arising from the lower pole right kidney measuring 19 mm in size. This is best seen axial 3 image 41. A 2.1 cm fluid attenuation cyst is seen arising from the interpolar left kidney. Smaller renal sinus cysts are present. Mild atrophy of both kidneys is similar to prior. No obstructive urolithiasis or hydronephrosis. Urinary bladder is unremarkable for the degree of distention. Stomach/Bowel: Distal esophagus and stomach are free of acute abnormality. There is a lamellated hypoattenuating structure in the first portion of the duodenum concerning for a intraluminal gallstone as a result of the cholecystoduodenal fistula detailed above. Overall, the degree of inflammation in this region is similar to slightly diminished from prior exam though there appendage there is to have been no significant interval migration of this structure in the interim since 08/12/2019. No evidence of resulting gastric outlet obstruction. No  frank small bowel dilatation or wall thickening elsewhere within the included small bowel. A normal appendix is visualized. No colonic dilatation or wall thickening. Vascular/Lymphatic: Extensive atherosclerotic calcification throughout the abdominal aorta and branch vessels. Major venous structures are unremarkable. Some reactive adenopathy noted in the upper abdomen. No concerning abdominopelvic lymph nodes. Reproductive: Normal appearance of the uterus and adnexal structures. Other: There is some diffuse mild edematous changes of the body wall. A ventral diastasis and mild abdominal wall laxity are noted without frank bowel containing hernia. Overall appearance is unchanged from comparison. Musculoskeletal: Reversal the normal lumbar lordosis with severe multilevel discogenic and facet degenerative changes as well as interspinous arthrosis compatible with Baastrup's disease. Mild levocurvature of the lumbar spine is present with an apex at the L1-2 level. No suspicious lytic or blastic lesions within the included portions of the spine or bony pelvis. IMPRESSION: 1. 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 Lamellated hypoattenuating structure in the region of the duodenal bulb (6/37) concerning for  a intraluminal gallstone does not demonstrate significant interval migration in the interim since the comparison study. Overall degree of inflammation is slightly diminished. No resulting gastric outlet obstruction. 2. No distal small bowel obstruction. 3. There is a thick walled solid/cystic appearing lesion arising from the lower pole right kidney measuring 19 mm in size. 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 Consider further evaluation with nonemergent outpatient contrast enhanced MRI with subtraction particularly given poor visualization on comparison ultrasound 07/05/2019. This recommendation follows ACR  consensus guidelines: Management of the Incidental Renal Mass on CT: A White Paper of the ACR Incidental Findings Committee. J Am Coll Radiol 2018;15:264-273. 4. Aortic Atherosclerosis (ICD10-I70.0). 5. Mild body wall edema, correlate with patient's fluid status. These results were called by telephone at the time of interpretation on 08/16/2019 at 3:36 am to provider Dr. Clayborne Dana, who verbally acknowledged these results. Electronically Signed   By: Kreg Shropshire M.D.   On: 08/16/2019 03:37   US RENAL  Result Date: 08/21/2019 CLINICAL DATA:  Acute kidney injury. EXAM: RENAL / URINARY TRACT ULTRASOUND COMPLETE COMPARISON:  None. FINDINGS: Right Kidney: Renal measurements: 9.1 x 5.8 x 5.2 cm = volume: 144 mL . Echogenicity within normal limits. No mass or hydronephrosis visualized. Left Kidney: Not visualized. Bladder: Not seen, presumably decompressed. Other: None. IMPRESSION: 1. Visualized portion of the RIGHT kidney appears normal. Earlier CT abdomen of 08/16/2019 described a solid/cystic appearing lesion arising from the lower pole of the RIGHT kidney. This mass is not able to be visualized by ultrasound, presumably obscured by overlying bowel gas. Consider MRI for definitive characterization. When the patient is clinically stable and able to follow directions and hold their breath (preferably as an outpatient) further evaluation with dedicated abdominal MRI should be considered. 2. LEFT kidney is not able to be visualized by ultrasound, presumably obscured by overlying bowel gas. 3. No acute appearing renal findings.  No hydronephrosis. 4. Bladder is not seen, presumably decompressed. Electronically Signed   By: Bary Richard M.D.   On: 08/21/2019 14:25   NM Pulmonary Perf and Vent  Result Date: 08/20/2019 CLINICAL DATA:  Respiratory failure with hypoxemia and acute kidney injury EXAM: NUCLEAR MEDICINE VENTILATION - PERFUSION LUNG SCAN TECHNIQUE: Ventilation images were obtained in multiple projections using  inhaled aerosol Tc-63m DTPA. Perfusion images were obtained in multiple projections after intravenous injection of Tc-50m MAA. RADIOPHARMACEUTICALS:  40 mCi of Tc-97m DTPA aerosol inhalation and 1.5 mCi Tc36m MAA IV COMPARISON:  CT chest 08/20/2019, chest radiograph 08/20/2019 FINDINGS: Ventilation: Heterogenous with diffusely decreased ventilation to the left thorax. Smaller ventilation defects at the right lung base. Moderate defect at the left lower lobe. Perfusion: Diffusely decreased perfusion to the left lung. Moderate perfusion defect left lower lobe. IMPRESSION: 1. Study is limited by habitus. 2. Diffusely decreased perfusion and ventilation to the left thorax, suspect that some of this is related to soft tissue attenuation. Moderate matched lower lung zone defect on the left with radiographic opacity. Overall intermediate probability for PE. Electronically Signed   By: Jasmine Pang M.D.   On: 08/20/2019 18:58   DG Chest Port 1 View  Result Date: 08/26/2019 CLINICAL DATA:  Short of breath EXAM: PORTABLE CHEST 1 VIEW COMPARISON:  08/24/2019 FINDINGS: Postop TAVR. Dual lead pacemaker unchanged. NG tube is been removed Bibasilar airspace disease left greater than right is unchanged. Negative for edema. IMPRESSION: Bibasilar airspace disease left greater than right unchanged. Possible atelectasis versus pneumonia. Electronically Signed   By: Marlan Palau M.D.  On: 08/26/2019 14:33   DG Chest Port 1 View  Result Date: 08/24/2019 CLINICAL DATA:  NG tube placement. EXAM: PORTABLE CHEST 1 VIEW COMPARISON:  08/20/2019 chest radiograph FINDINGS: An NG tube is identified entering the stomach with tip off the field of view. Cardiomediastinal silhouette is unchanged. LEFT LOWER lung consolidation/atelectasis and RIGHT LOWER lung atelectasis again noted. A pacemaker and aortic valve replacement again noted. IMPRESSION: NG tube entering the stomach with tip off the field of view. Electronically Signed   By:  Harmon Pier M.D.   On: 08/24/2019 12:30   DG CHEST PORT 1 VIEW  Result Date: 08/20/2019 CLINICAL DATA:  NG tube placement. EXAM: PORTABLE CHEST 1 VIEW COMPARISON:  Imaging earlier this day. FINDINGS: The enteric tube is visualized to the gastroesophageal junction, side port tentatively in the lower esophagus. Left-sided pacemaker in place. Cardiomegaly with unchanged mediastinal contours. TAVR. Small left pleural effusion with multifocal atelectasis. IMPRESSION: The enteric tube is visualized to the distal esophagus, tip likely at the gastroesophageal junction, side-port tentatively seen in the distal esophagus. Recommend advancement of 8-10 cm for optimal placement. Electronically Signed   By: Narda Rutherford M.D.   On: 08/20/2019 23:56   DG Chest Port 1 View  Result Date: 08/20/2019 CLINICAL DATA:  79 year old female with history of hypoxia. EXAM: PORTABLE CHEST 1 VIEW COMPARISON:  Chest x-ray 07/04/2019. FINDINGS: Lung volumes are low. Opacity at the left base which may reflect atelectasis and/or consolidation, possibly with small left pleural effusion. Right lung is clear. No definite right pleural effusion. No pneumothorax. No definite suspicious appearing pulmonary nodules or masses are noted. No evidence of pulmonary edema. Mild cardiomegaly. Upper mediastinal contours are distorted by patient positioning. Aortic atherosclerosis. Status post TAVR. Left-sided pacemaker device in place with lead tips projecting over the expected location of the right atrium and right ventricle. IMPRESSION: 1. Low lung volumes with atelectasis and/or consolidation in the left lower lobe with probable small left pleural effusion. 2. Mild cardiomegaly. 3. Aortic atherosclerosis. 4. Postprocedural changes and support apparatus, as above. Electronically Signed   By: Trudie Reed M.D.   On: 08/20/2019 10:33   DG Abd Portable 1V  Result Date: 08/21/2019 CLINICAL DATA:  NG tube placement. Small bowel obstruction. EXAM:  PORTABLE ABDOMEN - 1 VIEW COMPARISON:  CT scan of the abdomen dated 08/20/2019 FINDINGS: NG tube tip appears to be distal body of the stomach. Persistent dilated small bowel. The colon is not distended. Air in the biliary tree as noted on the prior CT scan. No acute bone abnormality. IMPRESSION: NG tube tip appears to be in the distal body of the stomach. Persistent small bowel obstruction. Electronically Signed   By: Francene Boyers M.D.   On: 08/21/2019 10:50   US Abdomen Limited RUQ  Result Date: 08/15/2019 CLINICAL DATA:  Upper abdominal pain for 1 day, known history of gallstones EXAM: ULTRASOUND ABDOMEN LIMITED RIGHT UPPER QUADRANT COMPARISON:  08/12/2019 FINDINGS: Gallbladder: Gallbladder demonstrates thickened wall to 4 mm. Negative sonographic Murphy's sign is noted. Echogenicity is noted within the gallbladder lumen which when compared with the prior CT examination likely represents air. Common bile duct: Diameter: 4.2 mm. Liver: No focal lesion identified. Within normal limits in parenchymal echogenicity. Portal vein is patent on color Doppler imaging with normal direction of blood flow towards the liver. Echogenicity is noted within the left lobe of the liver likely related to pneumobilia as previously seen. Other: None. IMPRESSION: Gallbladder wall thickening with echogenicity within consistent with air as seen  previously. It would be difficult to exclude some underlying gallstones as well. Previously seen pneumobilia is less well appreciated on today's exam. Electronically Signed   By: Alcide CleverMark  Lukens M.D.   On: 08/15/2019 22:00       Subjective: She denies any shortness of breath, cough, abdominal pain  Discharge Exam: Vitals:   08/27/19 2103 08/28/19 0459 08/28/19 1355 08/28/19 1406  BP: 138/72 130/63 (!) 125/96 (!) 142/69  Pulse: 84 80 89 90  Resp: 20 16 20 16   Temp: 98.7 F (37.1 C) 98.2 F (36.8 C) 98.1 F (36.7 C)   TempSrc: Oral Oral    SpO2: 97% 97% 97% 98%  Weight:       Height:        General: Pt is alert, awake, not in acute distress Cardiovascular: RRR, S1/S2 +, no rubs, no gallops Respiratory: CTA bilaterally, no wheezing, no rhonchi Abdominal: Soft, NT, ND, bowel sounds + Extremities: 1+ edema, no cyanosis    The results of significant diagnostics from this hospitalization (including imaging, microbiology, ancillary and laboratory) are listed below for reference.     Microbiology: Recent Results (from the past 240 hour(s))  Respiratory Panel by RT PCR (Flu A&B, Covid) - Nasopharyngeal Swab     Status: None   Collection Time: 08/20/19  9:56 AM   Specimen: Nasopharyngeal Swab  Result Value Ref Range Status   SARS Coronavirus 2 by RT PCR NEGATIVE NEGATIVE Final    Comment: (NOTE) SARS-CoV-2 target nucleic acids are NOT DETECTED. The SARS-CoV-2 RNA is generally detectable in upper respiratoy specimens during the acute phase of infection. The lowest concentration of SARS-CoV-2 viral copies this assay can detect is 131 copies/mL. A negative result does not preclude SARS-Cov-2 infection and should not be used as the sole basis for treatment or other patient management decisions. A negative result may occur with  improper specimen collection/handling, submission of specimen other than nasopharyngeal swab, presence of viral mutation(s) within the areas targeted by this assay, and inadequate number of viral copies (<131 copies/mL). A negative result must be combined with clinical observations, patient history, and epidemiological information. The expected result is Negative. Fact Sheet for Patients:  https://www.moore.com/https://www.fda.gov/media/142436/download Fact Sheet for Healthcare Providers:  https://www.young.biz/https://www.fda.gov/media/142435/download This test is not yet ap proved or cleared by the [place] FDA and  has been authorized for detection and/or diagnosis of SARS-CoV-2 by FDA under an Emergency Use Authorization (EUA). This EUA will remain  in effect (meaning  this test can be used) for the duration of the COVID-19 declaration under Section 564(b)(1) of the Act, 21 U.S.C. section 360bbb-3(b)(1), unless the authorization is terminated or revoked sooner.    Influenza A by PCR NEGATIVE NEGATIVE Final   Influenza B by PCR NEGATIVE NEGATIVE Final    Comment: (NOTE) The Xpert Xpress SARS-CoV-2/FLU/RSV assay is intended as an aid in  the diagnosis of influenza from Nasopharyngeal swab specimens and  should not be used as a sole basis for treatment. Nasal washings and  aspirates are unacceptable for Xpert Xpress SARS-CoV-2/FLU/RSV  testing. Fact Sheet for Patients: https://www.moore.com/https://www.fda.gov/media/142436/download Fact Sheet for Healthcare Providers: https://www.young.biz/https://www.fda.gov/media/142435/download This test is not yet approved or cleared by the [place] FDA and  has been authorized for detection and/or diagnosis of SARS-CoV-2 by  FDA under an Emergency Use Authorization (EUA). This EUA will remain  in effect (meaning this test can be used) for the duration of the  Covid-19 declaration under Section 564(b)(1) of the Act, 21  U.S.C. section 360bbb-3(b)(1), unless the  authorization is  terminated or revoked. Performed at [facility], [address] [place], [place] [address]   Blood Culture (routine x 2)     Status: None   Collection Time: 08/20/19 10:47 AM   Specimen: Blood  Result Value Ref Range Status   Specimen Description BLOOD LEFT HAND DRAWN BY RN  Final   Special Requests   Final    BOTTLES DRAWN AEROBIC AND ANAEROBIC Blood Culture adequate volume   Culture   Final    NO GROWTH 5 DAYS Performed at [facility], [address] [place], [place] [address]    Report Status 08/25/2019 FINAL  Final  Blood Culture (routine x 2)     Status: None   Collection Time: 08/20/19 10:48 AM   Specimen: Blood  Result Value Ref Range Status   Specimen Description BLOOD RIGHT HAND DRAWN BY RN  Final   Special Requests   Final    BOTTLES DRAWN AEROBIC AND  ANAEROBIC Blood Culture results may not be optimal due to an inadequate volume of blood received in culture bottles   Culture   Final    NO GROWTH 5 DAYS Performed at [facility], [address] [place], [place] [address]    Report Status 08/25/2019 FINAL  Final  Urine culture     Status: Abnormal   Collection Time: 08/20/19  1:49 PM   Specimen: Urine, Catheterized  Result Value Ref Range Status   Specimen Description   Final    URINE, CATHETERIZED Performed at [facility], [address] [place], [place] [address]    Special Requests   Final    NONE Performed at [facility], [address] [place], [place] [address]    Culture (A)  Final    >=100,000 COLONIES/mL LACTOBACILLUS SPECIES Standardized susceptibility testing for this organism is not available. Performed at [facility], [place] [address] [place], [place] [address]    Report Status 08/22/2019 FINAL  Final     Labs: BNP (last 3 results) Recent Labs    08/20/19 1048 08/26/19 0531  BNP 339.0* 209.4*   Basic Metabolic Panel: Recent Labs  Lab 08/23/19 0507 08/23/19 0507 08/24/19 0741 08/25/19 0443 08/26/19 0531 08/27/19 0507 08/28/19 0534  NA 142   < > 144 144 142 141 140  K 2.8*   < > 3.6 3.3* 4.0 4.2 3.5  CL 101   < > 102 103 102 103 105  CO2 29   < > 29 28 28 27 27   GLUCOSE 79   < > 86 120* 121* 109* 99  BUN 52*   < > 35* 28* 35* 38* 31*  CREATININE 1.17*   < > 0.85 0.72 0.91 0.98 0.75  CALCIUM 8.4*   < > 8.4* 9.0 8.8* 8.1* 7.8*  MG 2.1  --  1.9 1.9 1.8 1.6*  --    < > = values in this interval not displayed.   Liver Function Tests: Recent Labs  Lab 08/22/19 0512 08/23/19 0507  AST 18 16  ALT 17 15  ALKPHOS 56 48  BILITOT 0.7 1.0  PROT 5.6* 5.5*  ALBUMIN 2.5* 2.3*   No results for input(s): LIPASE, AMYLASE in the last 168 hours. No results for input(s): AMMONIA in the last 168 hours. CBC: Recent Labs  Lab 08/24/19 0741 08/25/19 0443 08/26/19 0531 08/27/19 0507 08/28/19 0534   WBC 9.7 13.0* 14.9* 17.7* 11.2*  HGB 11.9* 13.4 13.1 12.1 11.0*  HCT 38.9 43.2 42.7 39.6 36.0  MCV 96.3 95.4 95.1 95.2 95.5  PLT 241 293 273 250 234   Cardiac Enzymes: No results for input(s): CKTOTAL, CKMB, CKMBINDEX, TROPONINI in the last 168 hours. BNP: Invalid input(s): POCBNP CBG: No results for input(s): GLUCAP in the last 168 hours. D-Dimer No results for input(s): DDIMER in the last 72 hours. Hgb A1c No results for input(s): HGBA1C in the last 72 hours. Lipid Profile No results for input(s): CHOL, HDL, LDLCALC, TRIG, CHOLHDL, LDLDIRECT in the last 72 hours. Thyroid function studies No results for input(s): TSH, T4TOTAL, T3FREE, THYROIDAB in the last 72 hours.  Invalid input(s): FREET3 Anemia work up No results for input(s): VITAMINB12, FOLATE, FERRITIN, TIBC, IRON, RETICCTPCT in the last 72 hours. Urinalysis    Component Value Date/Time   COLORURINE AMBER (A) 08/20/2019 1348   APPEARANCEUR CLOUDY (A) 08/20/2019 1348   LABSPEC 1.023 08/20/2019 1348   PHURINE 5.0 08/20/2019 1348   GLUCOSEU NEGATIVE 08/20/2019 1348   HGBUR SMALL (A) 08/20/2019 1348   BILIRUBINUR SMALL (A) 08/20/2019 1348   KETONESUR NEGATIVE 08/20/2019 1348   PROTEINUR 30 (A) 08/20/2019 1348   NITRITE NEGATIVE 08/20/2019 1348   LEUKOCYTESUR TRACE (A) 08/20/2019 1348   Sepsis Labs Invalid input(s): PROCALCITONIN,  WBC,  LACTICIDVEN Microbiology Recent Results (from the past 240 hour(s))  Respiratory Panel by RT PCR (Flu A&B, Covid) - Nasopharyngeal Swab     Status: None   Collection Time: 08/20/19  9:56 AM   Specimen: Nasopharyngeal Swab  Result Value Ref Range Status   SARS Coronavirus 2 by RT PCR NEGATIVE NEGATIVE Final    Comment: (NOTE) SARS-CoV-2 target nucleic acids are NOT DETECTED. The SARS-CoV-2 RNA is generally detectable in upper respiratoy specimens during the acute phase of infection. The lowest concentration of SARS-CoV-2 viral copies this assay can detect is 131 copies/mL. A  negative result does not preclude SARS-Cov-2 infection and should not be used as the sole basis for treatment or other patient management decisions. A negative result may occur with  improper specimen collection/handling, submission of specimen other than nasopharyngeal swab, presence of viral mutation(s) within the areas targeted by this assay, and inadequate number of viral copies (<131 copies/mL). A negative result must be combined with clinical observations, patient history, and epidemiological information. The expected result is Negative. Fact Sheet for Patients:  https://www.moore.com/ Fact Sheet for Healthcare Providers:  https://www.young.biz/ This test is not yet ap proved or cleared by the [place] FDA and  has been authorized for detection and/or diagnosis of SARS-CoV-2 by FDA under an Emergency Use Authorization (EUA). This EUA will remain  in effect (meaning this test can be used) for the duration of the COVID-19 declaration under Section 564(b)(1) of the Act, 21 U.S.C. section 360bbb-3(b)(1), unless the authorization is terminated or revoked sooner.    Influenza A by PCR NEGATIVE NEGATIVE Final   Influenza B by PCR NEGATIVE NEGATIVE Final    Comment: (NOTE) The Xpert Xpress SARS-CoV-2/FLU/RSV assay is intended as an aid in  the diagnosis of influenza from Nasopharyngeal swab specimens and  should not be used as a sole basis for treatment. Nasal washings and  aspirates are unacceptable for Xpert Xpress SARS-CoV-2/FLU/RSV  testing. Fact Sheet for Patients: https://www.moore.com/ Fact Sheet for Healthcare Providers: https://www.young.biz/ This test is not yet approved or cleared by the [place] FDA and  has been authorized for detection and/or diagnosis of SARS-CoV-2 by  FDA under an Emergency Use Authorization (EUA). This EUA will remain  in effect (meaning this test can be  used) for  the duration of the  Covid-19 declaration under Section 564(b)(1) of the Act, 21  U.S.C. section 360bbb-3(b)(1), unless the authorization is  terminated or revoked. Performed at [facility], [address] [place], [place] [address]   Blood Culture (routine x 2)     Status: None   Collection Time: 08/20/19 10:47 AM   Specimen: Blood  Result Value Ref Range Status   Specimen Description BLOOD LEFT HAND DRAWN BY RN  Final   Special Requests   Final    BOTTLES DRAWN AEROBIC AND ANAEROBIC Blood Culture adequate volume   Culture   Final    NO GROWTH 5 DAYS Performed at [facility], [address] [place], [place] [address]    Report Status 08/25/2019 FINAL  Final  Blood Culture (routine x 2)     Status: None   Collection Time: 08/20/19 10:48 AM   Specimen: Blood  Result Value Ref Range Status   Specimen Description BLOOD RIGHT HAND DRAWN BY RN  Final   Special Requests   Final    BOTTLES DRAWN AEROBIC AND ANAEROBIC Blood Culture results may not be optimal due to an inadequate volume of blood received in culture bottles   Culture   Final    NO GROWTH 5 DAYS Performed at [facility], [address], [place], [place] [address]    Report Status 08/25/2019 FINAL  Final  Urine culture     Status: Abnormal   Collection Time: 08/20/19  1:49 PM   Specimen: Urine, Catheterized  Result Value Ref Range Status   Specimen Description   Final    URINE, CATHETERIZED Performed at [facility], [address], [place], [place] [address]    Special Requests   Final    NONE Performed at [facility], [address] [place], [place] [address]    Culture (A)  Final    >=100,000 COLONIES/mL LACTOBACILLUS SPECIES Standardized susceptibility testing for this organism is not available. Performed at [facility], [address] [place], [place] [address]    Report Status 08/22/2019 FINAL  Final     Time coordinating discharge:  SIGNED:   Erick Blinks, MD  [facility] 08/28/2019, 3:43 PM   If 7PM-7AM, please contact night-coverage [email or web address]

## 2019-09-01 DIAGNOSIS — I350 Nonrheumatic aortic (valve) stenosis: Secondary | ICD-10-CM | POA: Diagnosis not present

## 2019-09-01 DIAGNOSIS — I10 Essential (primary) hypertension: Secondary | ICD-10-CM | POA: Diagnosis not present

## 2019-09-01 DIAGNOSIS — J9621 Acute and chronic respiratory failure with hypoxia: Secondary | ICD-10-CM | POA: Diagnosis not present

## 2019-09-01 DIAGNOSIS — I5032 Chronic diastolic (congestive) heart failure: Secondary | ICD-10-CM | POA: Diagnosis not present

## 2019-09-03 DIAGNOSIS — I10 Essential (primary) hypertension: Secondary | ICD-10-CM | POA: Diagnosis not present

## 2019-09-03 DIAGNOSIS — J9621 Acute and chronic respiratory failure with hypoxia: Secondary | ICD-10-CM | POA: Diagnosis not present

## 2019-09-03 DIAGNOSIS — I639 Cerebral infarction, unspecified: Secondary | ICD-10-CM | POA: Diagnosis not present

## 2019-09-03 DIAGNOSIS — I5032 Chronic diastolic (congestive) heart failure: Secondary | ICD-10-CM | POA: Diagnosis not present

## 2019-09-10 ENCOUNTER — Telehealth: Payer: Self-pay | Admitting: *Deleted

## 2019-09-10 DIAGNOSIS — I639 Cerebral infarction, unspecified: Secondary | ICD-10-CM | POA: Diagnosis not present

## 2019-09-10 DIAGNOSIS — I10 Essential (primary) hypertension: Secondary | ICD-10-CM | POA: Diagnosis not present

## 2019-09-10 DIAGNOSIS — J9621 Acute and chronic respiratory failure with hypoxia: Secondary | ICD-10-CM | POA: Diagnosis not present

## 2019-09-10 DIAGNOSIS — I5032 Chronic diastolic (congestive) heart failure: Secondary | ICD-10-CM | POA: Diagnosis not present

## 2019-09-10 NOTE — Telephone Encounter (Signed)
LMOM (DPR) advising that [employer] PPM home monitor is disconnected. Requested that patient plug in monitor at bedside. Direct DC phone number provided for questions/concerns.

## 2019-09-11 IMAGING — RF DG ERCP WO/W SPHINCTEROTOMY
1 series · 10 of 10 positions shown · non-contrast
Comparison: None.

CLINICAL DATA: Choledocholithiasis

EXAM:
ERCP
TECHNIQUE: Multiple spot images obtained with the fluoroscopic device and
submitted for interpretation post-procedure.
FLUOROSCOPY TIME:  Fluoroscopy Time:  2 minutes and 34 seconds
Radiation Exposure Index (if provided by the fluoroscopic device):
Number of Acquired Spot Images: 0

[Series 1: run · 10 of 10 slices shown]
[im 1/10]
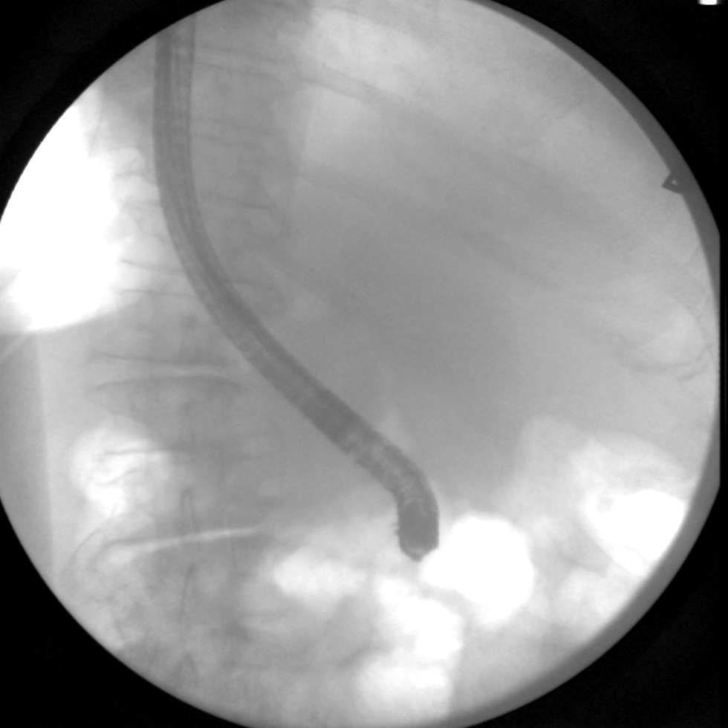
[im 2/10]
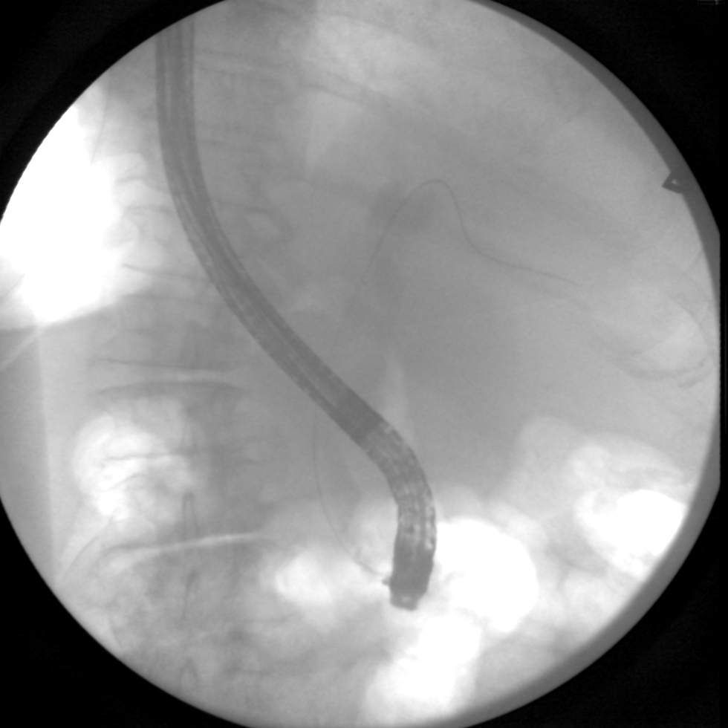
[im 3/10]
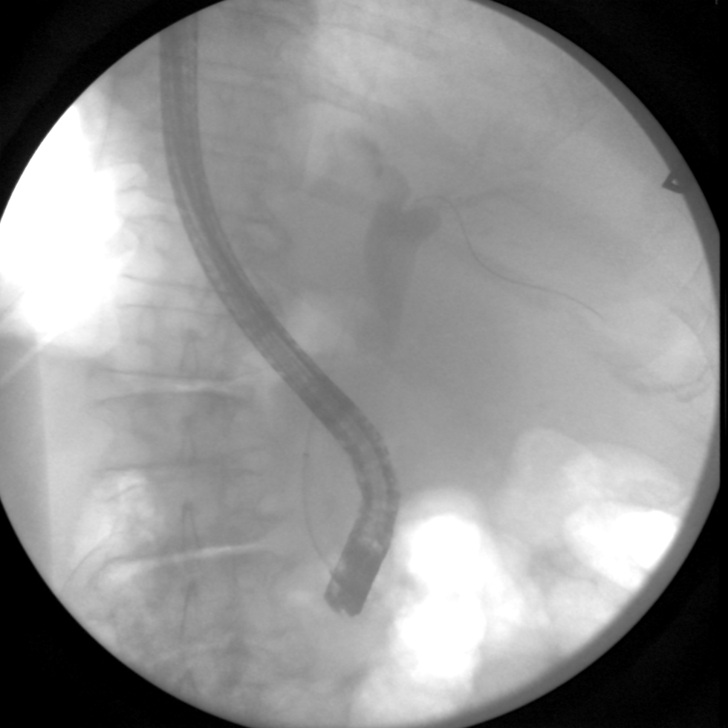
[im 4/10]
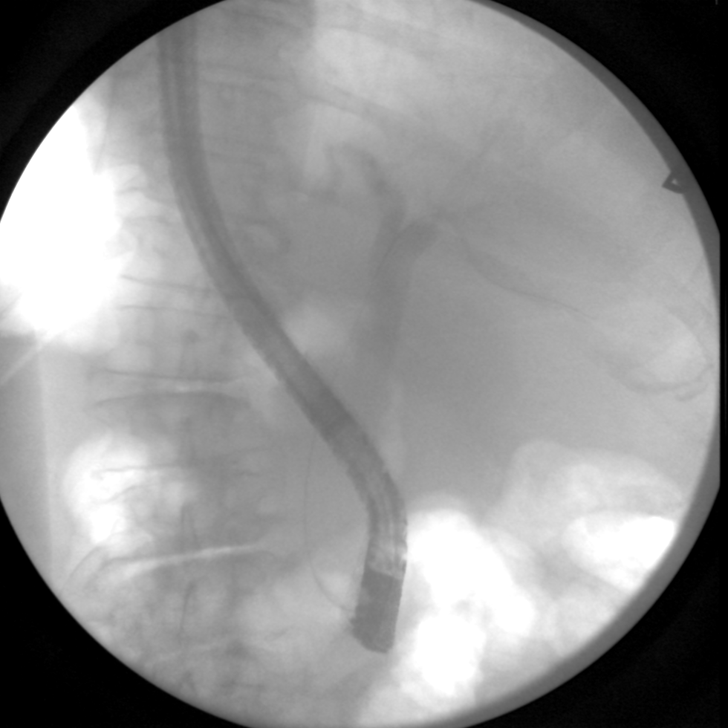
[im 5/10]
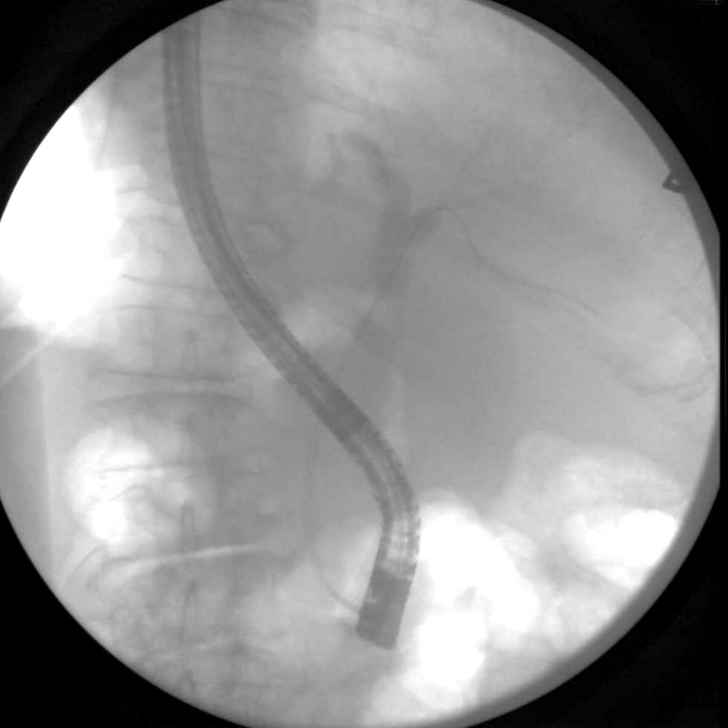
[im 6/10]
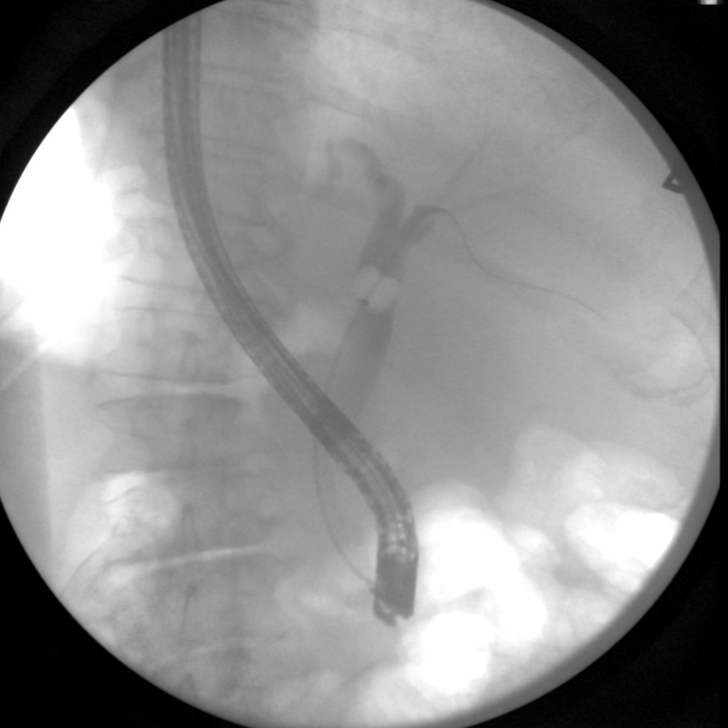
[im 7/10]
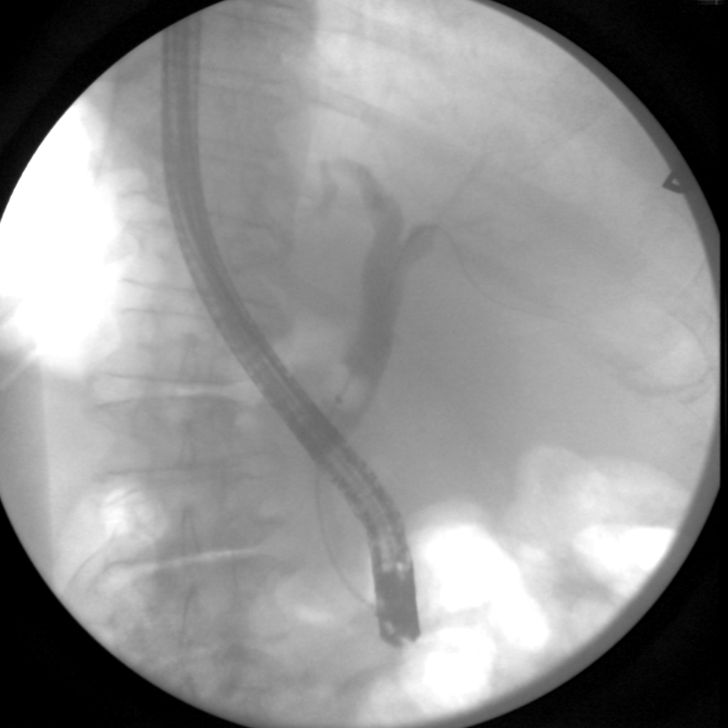
[im 8/10]
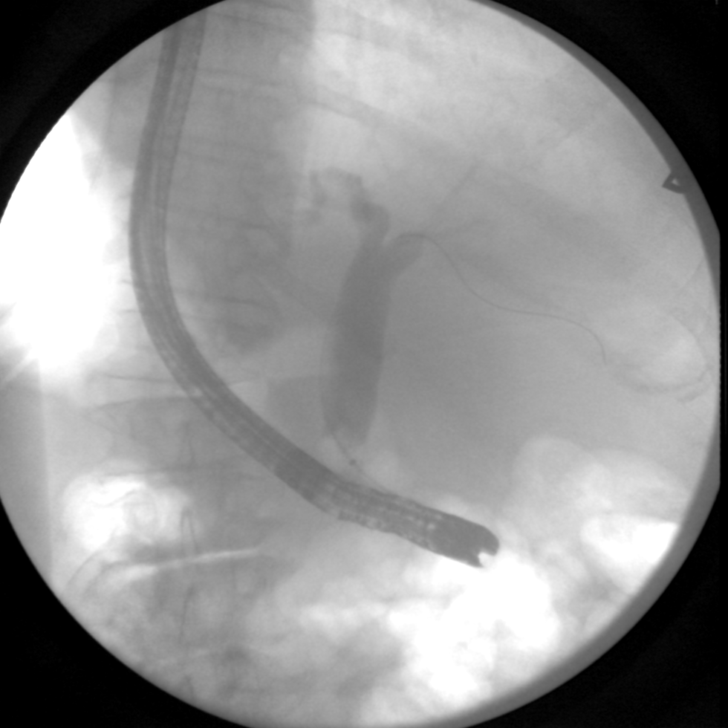
[im 9/10]
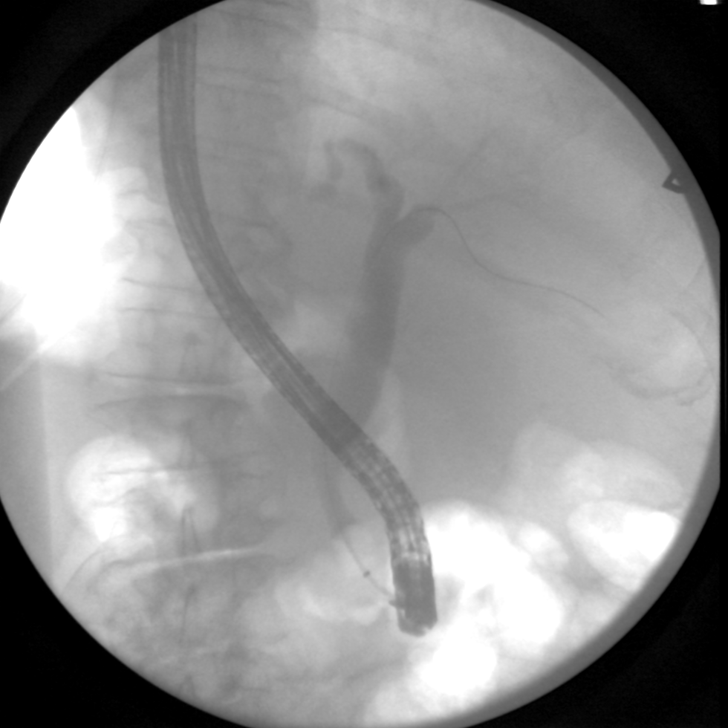
[im 10/10]
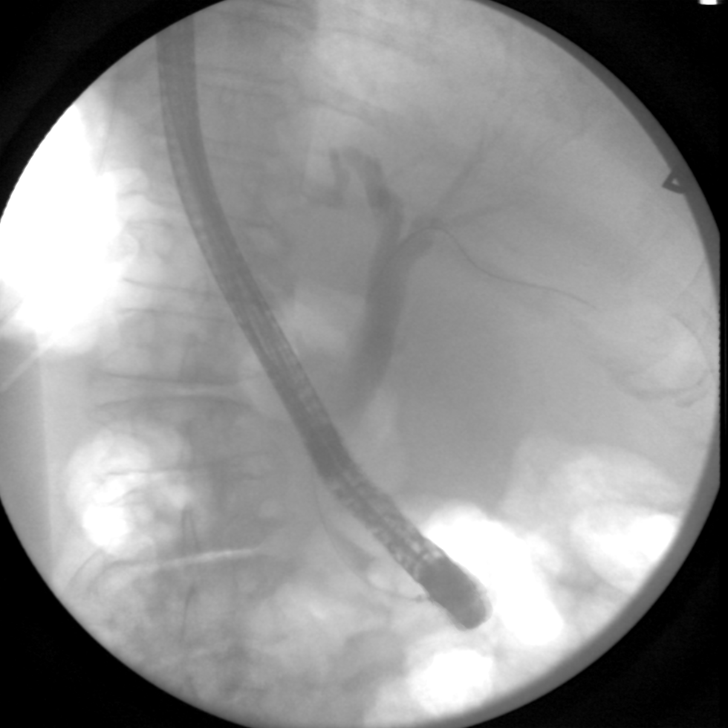

[10 of 10 positions shown; findings below may reference images not displayed]

FINDINGS: Several images demonstrate cannulation of the common bile duct and
contrast filling the biliary tree. Balloon stone retrieval is
documented.
IMPRESSION: See above.

These images were submitted for radiologic interpretation only.
Please see the procedural report for the amount of contrast and the
fluoroscopy time utilized.

## 2019-09-12 NOTE — Telephone Encounter (Signed)
[facility] requesting that pt plug in home monitor.  Spoke with Huntley Dec (DPR). She reports that pt is in a rehab facility and will be returning home next week. Requested that she bring monitor to facility if pt is not discharged next week for any reason. Huntley Dec in agreement with plan and denies additional questions or concerns at this time.

## 2019-09-16 DIAGNOSIS — J9621 Acute and chronic respiratory failure with hypoxia: Secondary | ICD-10-CM | POA: Diagnosis not present

## 2019-09-16 DIAGNOSIS — I10 Essential (primary) hypertension: Secondary | ICD-10-CM | POA: Diagnosis not present

## 2019-09-16 DIAGNOSIS — F329 Major depressive disorder, single episode, unspecified: Secondary | ICD-10-CM | POA: Diagnosis not present

## 2019-09-16 DIAGNOSIS — I5032 Chronic diastolic (congestive) heart failure: Secondary | ICD-10-CM | POA: Diagnosis not present

## 2019-09-17 ENCOUNTER — Telehealth: Payer: Self-pay | Admitting: Internal Medicine

## 2019-09-17 ENCOUNTER — Encounter: Payer: Self-pay | Admitting: Internal Medicine

## 2019-09-17 DIAGNOSIS — I69398 Other sequelae of cerebral infarction: Secondary | ICD-10-CM | POA: Diagnosis not present

## 2019-09-17 DIAGNOSIS — L89312 Pressure ulcer of right buttock, stage 2: Secondary | ICD-10-CM | POA: Diagnosis not present

## 2019-09-17 DIAGNOSIS — I110 Hypertensive heart disease with heart failure: Secondary | ICD-10-CM | POA: Diagnosis not present

## 2019-09-17 DIAGNOSIS — Z6841 Body Mass Index (BMI) 40.0 and over, adult: Secondary | ICD-10-CM | POA: Diagnosis not present

## 2019-09-17 DIAGNOSIS — E662 Morbid (severe) obesity with alveolar hypoventilation: Secondary | ICD-10-CM | POA: Diagnosis not present

## 2019-09-17 DIAGNOSIS — Z4800 Encounter for change or removal of nonsurgical wound dressing: Secondary | ICD-10-CM | POA: Diagnosis not present

## 2019-09-17 DIAGNOSIS — J9621 Acute and chronic respiratory failure with hypoxia: Secondary | ICD-10-CM | POA: Diagnosis not present

## 2019-09-17 DIAGNOSIS — I5032 Chronic diastolic (congestive) heart failure: Secondary | ICD-10-CM | POA: Diagnosis not present

## 2019-09-17 DIAGNOSIS — R29898 Other symptoms and signs involving the musculoskeletal system: Secondary | ICD-10-CM | POA: Diagnosis not present

## 2019-09-17 NOTE — Telephone Encounter (Signed)
Ok to schedule ov.  

## 2019-09-17 NOTE — Telephone Encounter (Signed)
HOSPITAL REFERRAL  

## 2019-09-17 NOTE — Telephone Encounter (Signed)
Home monitor up to date as of 09/17/19.

## 2019-09-18 DIAGNOSIS — I69398 Other sequelae of cerebral infarction: Secondary | ICD-10-CM | POA: Diagnosis not present

## 2019-09-18 DIAGNOSIS — J9621 Acute and chronic respiratory failure with hypoxia: Secondary | ICD-10-CM | POA: Diagnosis not present

## 2019-09-18 DIAGNOSIS — L89312 Pressure ulcer of right buttock, stage 2: Secondary | ICD-10-CM | POA: Diagnosis not present

## 2019-09-18 DIAGNOSIS — Z6841 Body Mass Index (BMI) 40.0 and over, adult: Secondary | ICD-10-CM | POA: Diagnosis not present

## 2019-09-18 DIAGNOSIS — Z0000 Encounter for general adult medical examination without abnormal findings: Secondary | ICD-10-CM | POA: Diagnosis not present

## 2019-09-18 DIAGNOSIS — Z4800 Encounter for change or removal of nonsurgical wound dressing: Secondary | ICD-10-CM | POA: Diagnosis not present

## 2019-09-18 DIAGNOSIS — F331 Major depressive disorder, recurrent, moderate: Secondary | ICD-10-CM | POA: Diagnosis not present

## 2019-09-18 DIAGNOSIS — E662 Morbid (severe) obesity with alveolar hypoventilation: Secondary | ICD-10-CM | POA: Diagnosis not present

## 2019-09-18 DIAGNOSIS — I5032 Chronic diastolic (congestive) heart failure: Secondary | ICD-10-CM | POA: Diagnosis not present

## 2019-09-18 DIAGNOSIS — G9009 Other idiopathic peripheral autonomic neuropathy: Secondary | ICD-10-CM | POA: Diagnosis not present

## 2019-09-18 DIAGNOSIS — I110 Hypertensive heart disease with heart failure: Secondary | ICD-10-CM | POA: Diagnosis not present

## 2019-09-18 DIAGNOSIS — G4730 Sleep apnea, unspecified: Secondary | ICD-10-CM | POA: Diagnosis not present

## 2019-09-18 DIAGNOSIS — G4733 Obstructive sleep apnea (adult) (pediatric): Secondary | ICD-10-CM | POA: Diagnosis not present

## 2019-09-18 DIAGNOSIS — R29898 Other symptoms and signs involving the musculoskeletal system: Secondary | ICD-10-CM | POA: Diagnosis not present

## 2019-09-18 DIAGNOSIS — E782 Mixed hyperlipidemia: Secondary | ICD-10-CM | POA: Diagnosis not present

## 2019-09-18 DIAGNOSIS — F329 Major depressive disorder, single episode, unspecified: Secondary | ICD-10-CM | POA: Diagnosis not present

## 2019-09-19 DIAGNOSIS — I5032 Chronic diastolic (congestive) heart failure: Secondary | ICD-10-CM | POA: Diagnosis not present

## 2019-09-19 DIAGNOSIS — L89312 Pressure ulcer of right buttock, stage 2: Secondary | ICD-10-CM | POA: Diagnosis not present

## 2019-09-19 DIAGNOSIS — I69398 Other sequelae of cerebral infarction: Secondary | ICD-10-CM | POA: Diagnosis not present

## 2019-09-19 DIAGNOSIS — J9621 Acute and chronic respiratory failure with hypoxia: Secondary | ICD-10-CM | POA: Diagnosis not present

## 2019-09-19 DIAGNOSIS — E662 Morbid (severe) obesity with alveolar hypoventilation: Secondary | ICD-10-CM | POA: Diagnosis not present

## 2019-09-19 DIAGNOSIS — R29898 Other symptoms and signs involving the musculoskeletal system: Secondary | ICD-10-CM | POA: Diagnosis not present

## 2019-09-19 DIAGNOSIS — Z6841 Body Mass Index (BMI) 40.0 and over, adult: Secondary | ICD-10-CM | POA: Diagnosis not present

## 2019-09-19 DIAGNOSIS — Z4800 Encounter for change or removal of nonsurgical wound dressing: Secondary | ICD-10-CM | POA: Diagnosis not present

## 2019-09-19 DIAGNOSIS — I110 Hypertensive heart disease with heart failure: Secondary | ICD-10-CM | POA: Diagnosis not present

## 2019-09-22 DIAGNOSIS — Z4800 Encounter for change or removal of nonsurgical wound dressing: Secondary | ICD-10-CM | POA: Diagnosis not present

## 2019-09-22 DIAGNOSIS — Z6841 Body Mass Index (BMI) 40.0 and over, adult: Secondary | ICD-10-CM | POA: Diagnosis not present

## 2019-09-22 DIAGNOSIS — J9621 Acute and chronic respiratory failure with hypoxia: Secondary | ICD-10-CM | POA: Diagnosis not present

## 2019-09-22 DIAGNOSIS — L89312 Pressure ulcer of right buttock, stage 2: Secondary | ICD-10-CM | POA: Diagnosis not present

## 2019-09-22 DIAGNOSIS — R29898 Other symptoms and signs involving the musculoskeletal system: Secondary | ICD-10-CM | POA: Diagnosis not present

## 2019-09-22 DIAGNOSIS — E662 Morbid (severe) obesity with alveolar hypoventilation: Secondary | ICD-10-CM | POA: Diagnosis not present

## 2019-09-22 DIAGNOSIS — I69398 Other sequelae of cerebral infarction: Secondary | ICD-10-CM | POA: Diagnosis not present

## 2019-09-22 DIAGNOSIS — I5032 Chronic diastolic (congestive) heart failure: Secondary | ICD-10-CM | POA: Diagnosis not present

## 2019-09-22 DIAGNOSIS — I110 Hypertensive heart disease with heart failure: Secondary | ICD-10-CM | POA: Diagnosis not present

## 2019-09-23 DIAGNOSIS — Z4800 Encounter for change or removal of nonsurgical wound dressing: Secondary | ICD-10-CM | POA: Diagnosis not present

## 2019-09-23 DIAGNOSIS — I69398 Other sequelae of cerebral infarction: Secondary | ICD-10-CM | POA: Diagnosis not present

## 2019-09-23 DIAGNOSIS — I5032 Chronic diastolic (congestive) heart failure: Secondary | ICD-10-CM | POA: Diagnosis not present

## 2019-09-23 DIAGNOSIS — R29898 Other symptoms and signs involving the musculoskeletal system: Secondary | ICD-10-CM | POA: Diagnosis not present

## 2019-09-23 DIAGNOSIS — E662 Morbid (severe) obesity with alveolar hypoventilation: Secondary | ICD-10-CM | POA: Diagnosis not present

## 2019-09-23 DIAGNOSIS — I110 Hypertensive heart disease with heart failure: Secondary | ICD-10-CM | POA: Diagnosis not present

## 2019-09-23 DIAGNOSIS — Z6841 Body Mass Index (BMI) 40.0 and over, adult: Secondary | ICD-10-CM | POA: Diagnosis not present

## 2019-09-23 DIAGNOSIS — J9621 Acute and chronic respiratory failure with hypoxia: Secondary | ICD-10-CM | POA: Diagnosis not present

## 2019-09-23 DIAGNOSIS — L89312 Pressure ulcer of right buttock, stage 2: Secondary | ICD-10-CM | POA: Diagnosis not present

## 2019-09-24 DIAGNOSIS — R29898 Other symptoms and signs involving the musculoskeletal system: Secondary | ICD-10-CM | POA: Diagnosis not present

## 2019-09-24 DIAGNOSIS — I5032 Chronic diastolic (congestive) heart failure: Secondary | ICD-10-CM | POA: Diagnosis not present

## 2019-09-24 DIAGNOSIS — E662 Morbid (severe) obesity with alveolar hypoventilation: Secondary | ICD-10-CM | POA: Diagnosis not present

## 2019-09-24 DIAGNOSIS — Z6841 Body Mass Index (BMI) 40.0 and over, adult: Secondary | ICD-10-CM | POA: Diagnosis not present

## 2019-09-24 DIAGNOSIS — J9621 Acute and chronic respiratory failure with hypoxia: Secondary | ICD-10-CM | POA: Diagnosis not present

## 2019-09-24 DIAGNOSIS — I712 Thoracic aortic aneurysm, without rupture: Secondary | ICD-10-CM | POA: Diagnosis not present

## 2019-09-24 DIAGNOSIS — L89152 Pressure ulcer of sacral region, stage 2: Secondary | ICD-10-CM | POA: Diagnosis not present

## 2019-09-24 DIAGNOSIS — I69398 Other sequelae of cerebral infarction: Secondary | ICD-10-CM | POA: Diagnosis not present

## 2019-09-24 DIAGNOSIS — Z4800 Encounter for change or removal of nonsurgical wound dressing: Secondary | ICD-10-CM | POA: Diagnosis not present

## 2019-09-24 DIAGNOSIS — E440 Moderate protein-calorie malnutrition: Secondary | ICD-10-CM | POA: Diagnosis not present

## 2019-09-24 DIAGNOSIS — I110 Hypertensive heart disease with heart failure: Secondary | ICD-10-CM | POA: Diagnosis not present

## 2019-09-24 DIAGNOSIS — G629 Polyneuropathy, unspecified: Secondary | ICD-10-CM | POA: Diagnosis not present

## 2019-09-24 DIAGNOSIS — I350 Nonrheumatic aortic (valve) stenosis: Secondary | ICD-10-CM | POA: Diagnosis not present

## 2019-09-24 DIAGNOSIS — L89312 Pressure ulcer of right buttock, stage 2: Secondary | ICD-10-CM | POA: Diagnosis not present

## 2019-09-24 DIAGNOSIS — Z952 Presence of prosthetic heart valve: Secondary | ICD-10-CM | POA: Diagnosis not present

## 2019-09-24 DIAGNOSIS — M170 Bilateral primary osteoarthritis of knee: Secondary | ICD-10-CM | POA: Diagnosis not present

## 2019-09-24 DIAGNOSIS — E782 Mixed hyperlipidemia: Secondary | ICD-10-CM | POA: Diagnosis not present

## 2019-09-24 DIAGNOSIS — I700 Atherosclerosis of aorta: Secondary | ICD-10-CM | POA: Diagnosis not present

## 2019-09-24 DIAGNOSIS — F331 Major depressive disorder, recurrent, moderate: Secondary | ICD-10-CM | POA: Diagnosis not present

## 2019-09-24 DIAGNOSIS — T8189XA Other complications of procedures, not elsewhere classified, initial encounter: Secondary | ICD-10-CM | POA: Diagnosis not present

## 2019-09-25 DIAGNOSIS — I5032 Chronic diastolic (congestive) heart failure: Secondary | ICD-10-CM | POA: Diagnosis not present

## 2019-09-25 DIAGNOSIS — R29898 Other symptoms and signs involving the musculoskeletal system: Secondary | ICD-10-CM | POA: Diagnosis not present

## 2019-09-25 DIAGNOSIS — I110 Hypertensive heart disease with heart failure: Secondary | ICD-10-CM | POA: Diagnosis not present

## 2019-09-25 DIAGNOSIS — J9621 Acute and chronic respiratory failure with hypoxia: Secondary | ICD-10-CM | POA: Diagnosis not present

## 2019-09-25 DIAGNOSIS — I69398 Other sequelae of cerebral infarction: Secondary | ICD-10-CM | POA: Diagnosis not present

## 2019-09-25 DIAGNOSIS — E662 Morbid (severe) obesity with alveolar hypoventilation: Secondary | ICD-10-CM | POA: Diagnosis not present

## 2019-09-25 DIAGNOSIS — Z4800 Encounter for change or removal of nonsurgical wound dressing: Secondary | ICD-10-CM | POA: Diagnosis not present

## 2019-09-25 DIAGNOSIS — Z6841 Body Mass Index (BMI) 40.0 and over, adult: Secondary | ICD-10-CM | POA: Diagnosis not present

## 2019-09-25 DIAGNOSIS — L89312 Pressure ulcer of right buttock, stage 2: Secondary | ICD-10-CM | POA: Diagnosis not present

## 2019-09-26 DIAGNOSIS — L89312 Pressure ulcer of right buttock, stage 2: Secondary | ICD-10-CM | POA: Diagnosis not present

## 2019-09-26 DIAGNOSIS — I5032 Chronic diastolic (congestive) heart failure: Secondary | ICD-10-CM | POA: Diagnosis not present

## 2019-09-26 DIAGNOSIS — Z6841 Body Mass Index (BMI) 40.0 and over, adult: Secondary | ICD-10-CM | POA: Diagnosis not present

## 2019-09-26 DIAGNOSIS — I110 Hypertensive heart disease with heart failure: Secondary | ICD-10-CM | POA: Diagnosis not present

## 2019-09-26 DIAGNOSIS — E662 Morbid (severe) obesity with alveolar hypoventilation: Secondary | ICD-10-CM | POA: Diagnosis not present

## 2019-09-26 DIAGNOSIS — I69398 Other sequelae of cerebral infarction: Secondary | ICD-10-CM | POA: Diagnosis not present

## 2019-09-26 DIAGNOSIS — J9621 Acute and chronic respiratory failure with hypoxia: Secondary | ICD-10-CM | POA: Diagnosis not present

## 2019-09-26 DIAGNOSIS — R29898 Other symptoms and signs involving the musculoskeletal system: Secondary | ICD-10-CM | POA: Diagnosis not present

## 2019-09-26 DIAGNOSIS — Z4800 Encounter for change or removal of nonsurgical wound dressing: Secondary | ICD-10-CM | POA: Diagnosis not present

## 2019-09-30 DIAGNOSIS — I69398 Other sequelae of cerebral infarction: Secondary | ICD-10-CM | POA: Diagnosis not present

## 2019-09-30 DIAGNOSIS — I110 Hypertensive heart disease with heart failure: Secondary | ICD-10-CM | POA: Diagnosis not present

## 2019-09-30 DIAGNOSIS — R29898 Other symptoms and signs involving the musculoskeletal system: Secondary | ICD-10-CM | POA: Diagnosis not present

## 2019-09-30 DIAGNOSIS — L89312 Pressure ulcer of right buttock, stage 2: Secondary | ICD-10-CM | POA: Diagnosis not present

## 2019-09-30 DIAGNOSIS — Z4800 Encounter for change or removal of nonsurgical wound dressing: Secondary | ICD-10-CM | POA: Diagnosis not present

## 2019-09-30 DIAGNOSIS — I5032 Chronic diastolic (congestive) heart failure: Secondary | ICD-10-CM | POA: Diagnosis not present

## 2019-09-30 DIAGNOSIS — J9621 Acute and chronic respiratory failure with hypoxia: Secondary | ICD-10-CM | POA: Diagnosis not present

## 2019-09-30 DIAGNOSIS — Z6841 Body Mass Index (BMI) 40.0 and over, adult: Secondary | ICD-10-CM | POA: Diagnosis not present

## 2019-09-30 DIAGNOSIS — E662 Morbid (severe) obesity with alveolar hypoventilation: Secondary | ICD-10-CM | POA: Diagnosis not present

## 2019-10-02 DIAGNOSIS — Z4800 Encounter for change or removal of nonsurgical wound dressing: Secondary | ICD-10-CM | POA: Diagnosis not present

## 2019-10-02 DIAGNOSIS — I5032 Chronic diastolic (congestive) heart failure: Secondary | ICD-10-CM | POA: Diagnosis not present

## 2019-10-02 DIAGNOSIS — E662 Morbid (severe) obesity with alveolar hypoventilation: Secondary | ICD-10-CM | POA: Diagnosis not present

## 2019-10-02 DIAGNOSIS — I110 Hypertensive heart disease with heart failure: Secondary | ICD-10-CM | POA: Diagnosis not present

## 2019-10-02 DIAGNOSIS — J9621 Acute and chronic respiratory failure with hypoxia: Secondary | ICD-10-CM | POA: Diagnosis not present

## 2019-10-02 DIAGNOSIS — L89312 Pressure ulcer of right buttock, stage 2: Secondary | ICD-10-CM | POA: Diagnosis not present

## 2019-10-02 DIAGNOSIS — I69398 Other sequelae of cerebral infarction: Secondary | ICD-10-CM | POA: Diagnosis not present

## 2019-10-02 DIAGNOSIS — R29898 Other symptoms and signs involving the musculoskeletal system: Secondary | ICD-10-CM | POA: Diagnosis not present

## 2019-10-02 DIAGNOSIS — Z6841 Body Mass Index (BMI) 40.0 and over, adult: Secondary | ICD-10-CM | POA: Diagnosis not present

## 2019-10-03 DIAGNOSIS — J9621 Acute and chronic respiratory failure with hypoxia: Secondary | ICD-10-CM | POA: Diagnosis not present

## 2019-10-03 DIAGNOSIS — E662 Morbid (severe) obesity with alveolar hypoventilation: Secondary | ICD-10-CM | POA: Diagnosis not present

## 2019-10-03 DIAGNOSIS — Z6841 Body Mass Index (BMI) 40.0 and over, adult: Secondary | ICD-10-CM | POA: Diagnosis not present

## 2019-10-03 DIAGNOSIS — L89312 Pressure ulcer of right buttock, stage 2: Secondary | ICD-10-CM | POA: Diagnosis not present

## 2019-10-03 DIAGNOSIS — Z4800 Encounter for change or removal of nonsurgical wound dressing: Secondary | ICD-10-CM | POA: Diagnosis not present

## 2019-10-03 DIAGNOSIS — R29898 Other symptoms and signs involving the musculoskeletal system: Secondary | ICD-10-CM | POA: Diagnosis not present

## 2019-10-03 DIAGNOSIS — I110 Hypertensive heart disease with heart failure: Secondary | ICD-10-CM | POA: Diagnosis not present

## 2019-10-03 DIAGNOSIS — I69398 Other sequelae of cerebral infarction: Secondary | ICD-10-CM | POA: Diagnosis not present

## 2019-10-03 DIAGNOSIS — I5032 Chronic diastolic (congestive) heart failure: Secondary | ICD-10-CM | POA: Diagnosis not present

## 2019-10-07 DIAGNOSIS — R29898 Other symptoms and signs involving the musculoskeletal system: Secondary | ICD-10-CM | POA: Diagnosis not present

## 2019-10-07 DIAGNOSIS — L89312 Pressure ulcer of right buttock, stage 2: Secondary | ICD-10-CM | POA: Diagnosis not present

## 2019-10-07 DIAGNOSIS — E662 Morbid (severe) obesity with alveolar hypoventilation: Secondary | ICD-10-CM | POA: Diagnosis not present

## 2019-10-07 DIAGNOSIS — Z6841 Body Mass Index (BMI) 40.0 and over, adult: Secondary | ICD-10-CM | POA: Diagnosis not present

## 2019-10-07 DIAGNOSIS — I69398 Other sequelae of cerebral infarction: Secondary | ICD-10-CM | POA: Diagnosis not present

## 2019-10-07 DIAGNOSIS — I110 Hypertensive heart disease with heart failure: Secondary | ICD-10-CM | POA: Diagnosis not present

## 2019-10-07 DIAGNOSIS — Z4800 Encounter for change or removal of nonsurgical wound dressing: Secondary | ICD-10-CM | POA: Diagnosis not present

## 2019-10-07 DIAGNOSIS — I5032 Chronic diastolic (congestive) heart failure: Secondary | ICD-10-CM | POA: Diagnosis not present

## 2019-10-07 DIAGNOSIS — J9621 Acute and chronic respiratory failure with hypoxia: Secondary | ICD-10-CM | POA: Diagnosis not present

## 2019-10-08 DIAGNOSIS — I69398 Other sequelae of cerebral infarction: Secondary | ICD-10-CM | POA: Diagnosis not present

## 2019-10-08 DIAGNOSIS — J9621 Acute and chronic respiratory failure with hypoxia: Secondary | ICD-10-CM | POA: Diagnosis not present

## 2019-10-08 DIAGNOSIS — Z4800 Encounter for change or removal of nonsurgical wound dressing: Secondary | ICD-10-CM | POA: Diagnosis not present

## 2019-10-08 DIAGNOSIS — I110 Hypertensive heart disease with heart failure: Secondary | ICD-10-CM | POA: Diagnosis not present

## 2019-10-08 DIAGNOSIS — L89312 Pressure ulcer of right buttock, stage 2: Secondary | ICD-10-CM | POA: Diagnosis not present

## 2019-10-08 DIAGNOSIS — Z6841 Body Mass Index (BMI) 40.0 and over, adult: Secondary | ICD-10-CM | POA: Diagnosis not present

## 2019-10-08 DIAGNOSIS — I5032 Chronic diastolic (congestive) heart failure: Secondary | ICD-10-CM | POA: Diagnosis not present

## 2019-10-08 DIAGNOSIS — R29898 Other symptoms and signs involving the musculoskeletal system: Secondary | ICD-10-CM | POA: Diagnosis not present

## 2019-10-08 DIAGNOSIS — E662 Morbid (severe) obesity with alveolar hypoventilation: Secondary | ICD-10-CM | POA: Diagnosis not present

## 2019-10-09 DIAGNOSIS — I110 Hypertensive heart disease with heart failure: Secondary | ICD-10-CM | POA: Diagnosis not present

## 2019-10-09 DIAGNOSIS — Z6841 Body Mass Index (BMI) 40.0 and over, adult: Secondary | ICD-10-CM | POA: Diagnosis not present

## 2019-10-09 DIAGNOSIS — I69398 Other sequelae of cerebral infarction: Secondary | ICD-10-CM | POA: Diagnosis not present

## 2019-10-09 DIAGNOSIS — L89312 Pressure ulcer of right buttock, stage 2: Secondary | ICD-10-CM | POA: Diagnosis not present

## 2019-10-09 DIAGNOSIS — R29898 Other symptoms and signs involving the musculoskeletal system: Secondary | ICD-10-CM | POA: Diagnosis not present

## 2019-10-09 DIAGNOSIS — I5032 Chronic diastolic (congestive) heart failure: Secondary | ICD-10-CM | POA: Diagnosis not present

## 2019-10-09 DIAGNOSIS — J9621 Acute and chronic respiratory failure with hypoxia: Secondary | ICD-10-CM | POA: Diagnosis not present

## 2019-10-09 DIAGNOSIS — Z4800 Encounter for change or removal of nonsurgical wound dressing: Secondary | ICD-10-CM | POA: Diagnosis not present

## 2019-10-09 DIAGNOSIS — E662 Morbid (severe) obesity with alveolar hypoventilation: Secondary | ICD-10-CM | POA: Diagnosis not present

## 2019-10-13 ENCOUNTER — Ambulatory Visit (INDEPENDENT_AMBULATORY_CARE_PROVIDER_SITE_OTHER): Payer: Medicare PPO | Admitting: *Deleted

## 2019-10-13 DIAGNOSIS — I442 Atrioventricular block, complete: Secondary | ICD-10-CM

## 2019-10-13 LAB — CUP PACEART REMOTE DEVICE CHECK
Date Time Interrogation Session: 20210614111009
Implantable Lead Implant Date: 20200311
Implantable Lead Implant Date: 20200311
Implantable Lead Location: 753859
Implantable Lead Location: 753860
Implantable Lead Model: 377
Implantable Lead Model: 377
Implantable Lead Serial Number: 81024123
Implantable Lead Serial Number: 81075341
Implantable Pulse Generator Implant Date: 20200311
Pulse Gen Model: 407145
Pulse Gen Serial Number: 69550148

## 2019-10-14 ENCOUNTER — Encounter: Payer: Self-pay | Admitting: General Surgery

## 2019-10-14 ENCOUNTER — Other Ambulatory Visit: Payer: Self-pay

## 2019-10-14 ENCOUNTER — Ambulatory Visit (INDEPENDENT_AMBULATORY_CARE_PROVIDER_SITE_OTHER): Payer: Medicare PPO | Admitting: General Surgery

## 2019-10-14 VITALS — BP 119/74 | HR 90 | Temp 97.6°F | Resp 17

## 2019-10-14 DIAGNOSIS — I5032 Chronic diastolic (congestive) heart failure: Secondary | ICD-10-CM | POA: Diagnosis not present

## 2019-10-14 DIAGNOSIS — J9621 Acute and chronic respiratory failure with hypoxia: Secondary | ICD-10-CM | POA: Diagnosis not present

## 2019-10-14 DIAGNOSIS — K823 Fistula of gallbladder: Secondary | ICD-10-CM | POA: Diagnosis not present

## 2019-10-14 DIAGNOSIS — E662 Morbid (severe) obesity with alveolar hypoventilation: Secondary | ICD-10-CM | POA: Diagnosis not present

## 2019-10-14 DIAGNOSIS — Z6841 Body Mass Index (BMI) 40.0 and over, adult: Secondary | ICD-10-CM | POA: Diagnosis not present

## 2019-10-14 DIAGNOSIS — L89312 Pressure ulcer of right buttock, stage 2: Secondary | ICD-10-CM | POA: Diagnosis not present

## 2019-10-14 DIAGNOSIS — R29898 Other symptoms and signs involving the musculoskeletal system: Secondary | ICD-10-CM | POA: Diagnosis not present

## 2019-10-14 DIAGNOSIS — I69398 Other sequelae of cerebral infarction: Secondary | ICD-10-CM | POA: Diagnosis not present

## 2019-10-14 DIAGNOSIS — I110 Hypertensive heart disease with heart failure: Secondary | ICD-10-CM | POA: Diagnosis not present

## 2019-10-14 DIAGNOSIS — Z4800 Encounter for change or removal of nonsurgical wound dressing: Secondary | ICD-10-CM | POA: Diagnosis not present

## 2019-10-14 NOTE — Progress Notes (Signed)
Remote pacemaker transmission.   

## 2019-10-15 NOTE — Progress Notes (Signed)
Subjective:     Dawn Gonzales [facility]  Patient is here for follow-up after several admissions for partial bowel obstruction.  Patient initially had a gallstone ileus in March of this year, but the gallstone passed.  She subsequently had an admission for shortness of breath and ileus which resolved.  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  She has multiple comorbidities including and an aortic valve replacement, morbid obesity, hypertension, pulmonary issues.  She currently primarily is mobile with a wheelchair with some use of a walker.  She denies any nausea, vomiting, abdominal pain, fever, chills, or jaundice.  She has not undergone cholecystectomy due to her multiple comorbidities. Objective:    BP 119/74   Pulse 90   Temp 97.6 F (36.4 C) (Other (Comment))   Resp 17   SpO2 91%   General:  alert, cooperative and no distress   Head is normocephalic, atraumatic Eyes are without scleral icterus Lungs clear to auscultation with good breath sounds bilaterally Heart examination reveals a prominent S2 without S3, S4, murmurs.  Regular rate and rhythm. Abdomen is soft, nontender, nondistended.  Difficult to assess hepatosplenomegaly due to body habitus.  Recent hospitalization records reviewed     Assessment:    Cholecysto enteric fistula, currently asymptomatic.    Plan:   Patient is at high risk for any abdominal surgical intervention.  She understands this and agrees.  I told her that any intervention may need to be done at a tertiary care center.  As she is asymptomatic, no need for referral at this time.  Patient and daughter agree.  Follow-up as needed.

## 2019-10-16 DIAGNOSIS — L89312 Pressure ulcer of right buttock, stage 2: Secondary | ICD-10-CM | POA: Diagnosis not present

## 2019-10-16 DIAGNOSIS — I69398 Other sequelae of cerebral infarction: Secondary | ICD-10-CM | POA: Diagnosis not present

## 2019-10-16 DIAGNOSIS — Z6841 Body Mass Index (BMI) 40.0 and over, adult: Secondary | ICD-10-CM | POA: Diagnosis not present

## 2019-10-16 DIAGNOSIS — I110 Hypertensive heart disease with heart failure: Secondary | ICD-10-CM | POA: Diagnosis not present

## 2019-10-16 DIAGNOSIS — J9621 Acute and chronic respiratory failure with hypoxia: Secondary | ICD-10-CM | POA: Diagnosis not present

## 2019-10-16 DIAGNOSIS — Z4800 Encounter for change or removal of nonsurgical wound dressing: Secondary | ICD-10-CM | POA: Diagnosis not present

## 2019-10-16 DIAGNOSIS — R29898 Other symptoms and signs involving the musculoskeletal system: Secondary | ICD-10-CM | POA: Diagnosis not present

## 2019-10-16 DIAGNOSIS — I5032 Chronic diastolic (congestive) heart failure: Secondary | ICD-10-CM | POA: Diagnosis not present

## 2019-10-16 DIAGNOSIS — E662 Morbid (severe) obesity with alveolar hypoventilation: Secondary | ICD-10-CM | POA: Diagnosis not present

## 2019-10-17 DIAGNOSIS — I5032 Chronic diastolic (congestive) heart failure: Secondary | ICD-10-CM | POA: Diagnosis not present

## 2019-10-17 DIAGNOSIS — Z6841 Body Mass Index (BMI) 40.0 and over, adult: Secondary | ICD-10-CM | POA: Diagnosis not present

## 2019-10-17 DIAGNOSIS — R29898 Other symptoms and signs involving the musculoskeletal system: Secondary | ICD-10-CM | POA: Diagnosis not present

## 2019-10-17 DIAGNOSIS — Z4800 Encounter for change or removal of nonsurgical wound dressing: Secondary | ICD-10-CM | POA: Diagnosis not present

## 2019-10-17 DIAGNOSIS — L89312 Pressure ulcer of right buttock, stage 2: Secondary | ICD-10-CM | POA: Diagnosis not present

## 2019-10-17 DIAGNOSIS — E662 Morbid (severe) obesity with alveolar hypoventilation: Secondary | ICD-10-CM | POA: Diagnosis not present

## 2019-10-17 DIAGNOSIS — J9621 Acute and chronic respiratory failure with hypoxia: Secondary | ICD-10-CM | POA: Diagnosis not present

## 2019-10-17 DIAGNOSIS — I69398 Other sequelae of cerebral infarction: Secondary | ICD-10-CM | POA: Diagnosis not present

## 2019-10-17 DIAGNOSIS — I110 Hypertensive heart disease with heart failure: Secondary | ICD-10-CM | POA: Diagnosis not present

## 2019-10-23 DIAGNOSIS — I69398 Other sequelae of cerebral infarction: Secondary | ICD-10-CM | POA: Diagnosis not present

## 2019-10-23 DIAGNOSIS — J9621 Acute and chronic respiratory failure with hypoxia: Secondary | ICD-10-CM | POA: Diagnosis not present

## 2019-10-23 DIAGNOSIS — I110 Hypertensive heart disease with heart failure: Secondary | ICD-10-CM | POA: Diagnosis not present

## 2019-10-23 DIAGNOSIS — Z6841 Body Mass Index (BMI) 40.0 and over, adult: Secondary | ICD-10-CM | POA: Diagnosis not present

## 2019-10-23 DIAGNOSIS — E662 Morbid (severe) obesity with alveolar hypoventilation: Secondary | ICD-10-CM | POA: Diagnosis not present

## 2019-10-23 DIAGNOSIS — L89312 Pressure ulcer of right buttock, stage 2: Secondary | ICD-10-CM | POA: Diagnosis not present

## 2019-10-23 DIAGNOSIS — I5032 Chronic diastolic (congestive) heart failure: Secondary | ICD-10-CM | POA: Diagnosis not present

## 2019-10-23 DIAGNOSIS — Z4800 Encounter for change or removal of nonsurgical wound dressing: Secondary | ICD-10-CM | POA: Diagnosis not present

## 2019-10-23 DIAGNOSIS — R29898 Other symptoms and signs involving the musculoskeletal system: Secondary | ICD-10-CM | POA: Diagnosis not present

## 2019-10-24 ENCOUNTER — Ambulatory Visit: Payer: Medicare PPO | Admitting: Gastroenterology

## 2019-10-24 DIAGNOSIS — E662 Morbid (severe) obesity with alveolar hypoventilation: Secondary | ICD-10-CM | POA: Diagnosis not present

## 2019-10-24 DIAGNOSIS — R29898 Other symptoms and signs involving the musculoskeletal system: Secondary | ICD-10-CM | POA: Diagnosis not present

## 2019-10-24 DIAGNOSIS — J9621 Acute and chronic respiratory failure with hypoxia: Secondary | ICD-10-CM | POA: Diagnosis not present

## 2019-10-24 DIAGNOSIS — I110 Hypertensive heart disease with heart failure: Secondary | ICD-10-CM | POA: Diagnosis not present

## 2019-10-24 DIAGNOSIS — L89312 Pressure ulcer of right buttock, stage 2: Secondary | ICD-10-CM | POA: Diagnosis not present

## 2019-10-24 DIAGNOSIS — I5032 Chronic diastolic (congestive) heart failure: Secondary | ICD-10-CM | POA: Diagnosis not present

## 2019-10-24 DIAGNOSIS — Z4800 Encounter for change or removal of nonsurgical wound dressing: Secondary | ICD-10-CM | POA: Diagnosis not present

## 2019-10-24 DIAGNOSIS — I69398 Other sequelae of cerebral infarction: Secondary | ICD-10-CM | POA: Diagnosis not present

## 2019-10-24 DIAGNOSIS — Z6841 Body Mass Index (BMI) 40.0 and over, adult: Secondary | ICD-10-CM | POA: Diagnosis not present

## 2019-10-28 ENCOUNTER — Telehealth: Payer: Self-pay

## 2019-10-28 NOTE — Telephone Encounter (Signed)
°  Patient Consent for Virtual Visit         Dawn Gonzales has provided verbal consent on 10/28/2019 for a virtual visit (video or telephone).   CONSENT FOR VIRTUAL VISIT FOR:  Dawn Gonzales  By participating in this virtual visit I agree to the following:  I hereby voluntarily request, consent and authorize [facility] and its employed or contracted physicians, physician assistants, nurse practitioners or other licensed health care professionals (the Practitioner), to provide me with telemedicine health care services (the Services") as deemed necessary by the treating Practitioner. I acknowledge and consent to receive the Services by the Practitioner via telemedicine. I understand that the telemedicine visit will involve communicating with the Practitioner through live audiovisual communication technology and the disclosure of certain medical information by electronic transmission. I acknowledge that I have been given the opportunity to request an in-person assessment or other available alternative prior to the telemedicine visit and am voluntarily participating in the telemedicine visit.  I understand that I have the right to withhold or withdraw my consent to the use of telemedicine in the course of my care at any time, without affecting my right to future care or treatment, and that the Practitioner or I may terminate the telemedicine visit at any time. I understand that I have the right to inspect all information obtained and/or recorded in the course of the telemedicine visit and may receive copies of available information for a reasonable fee.  I understand that some of the potential risks of receiving the Services via telemedicine include:   Delay or interruption in medical evaluation due to technological equipment failure or disruption;  Information transmitted may not be sufficient (e.g. poor resolution of images) to allow for appropriate medical decision making by the  Practitioner; and/or   In rare instances, security protocols could fail, causing a breach of personal health information.  Furthermore, I acknowledge that it is my responsibility to provide information about my medical history, conditions and care that is complete and accurate to the best of my ability. I acknowledge that Practitioner's advice, recommendations, and/or decision may be based on factors not within their control, such as incomplete or inaccurate data provided by me or distortions of diagnostic images or specimens that may result from electronic transmissions. I understand that the practice of medicine is not an exact science and that Practitioner makes no warranties or guarantees regarding treatment outcomes. I acknowledge that a copy of this consent can be made available to me via my patient portal [facility] MyChart), or I can request a printed copy by calling the office of [facility].    I understand that my insurance will be billed for this visit.   I have read or had this consent read to me.  I understand the contents of this consent, which adequately explains the benefits and risks of the Services being provided via telemedicine.   I have been provided ample opportunity to ask questions regarding this consent and the Services and have had my questions answered to my satisfaction.  I give my informed consent for the services to be provided through the use of telemedicine in my medical care

## 2019-10-29 ENCOUNTER — Telehealth (INDEPENDENT_AMBULATORY_CARE_PROVIDER_SITE_OTHER): Payer: Medicare PPO | Admitting: Cardiology

## 2019-10-29 ENCOUNTER — Encounter: Payer: Self-pay | Admitting: Cardiology

## 2019-10-29 ENCOUNTER — Other Ambulatory Visit: Payer: Self-pay

## 2019-10-29 VITALS — Ht 66.0 in

## 2019-10-29 DIAGNOSIS — Z950 Presence of cardiac pacemaker: Secondary | ICD-10-CM | POA: Diagnosis not present

## 2019-10-29 DIAGNOSIS — Z8679 Personal history of other diseases of the circulatory system: Secondary | ICD-10-CM | POA: Diagnosis not present

## 2019-10-29 DIAGNOSIS — I442 Atrioventricular block, complete: Secondary | ICD-10-CM

## 2019-10-29 DIAGNOSIS — Z953 Presence of xenogenic heart valve: Secondary | ICD-10-CM | POA: Diagnosis not present

## 2019-10-29 DIAGNOSIS — E782 Mixed hyperlipidemia: Secondary | ICD-10-CM | POA: Diagnosis not present

## 2019-10-29 NOTE — Patient Instructions (Addendum)

## 2019-10-29 NOTE — Progress Notes (Signed)
Virtual Visit via Telephone Note   This visit type was conducted due to national recommendations for restrictions regarding the COVID-19 Pandemic (e.g. social distancing) in an effort to limit this patient's exposure and mitigate transmission in our community.  Due to her co-morbid illnesses, this patient is at least at moderate risk for complications without adequate follow up.  This format is felt to be most appropriate for this patient at this time.  The patient did not have access to video technology/had technical difficulties with video requiring transitioning to audio format only (telephone).  All issues noted in this document were discussed and addressed.  No physical exam could be performed with this format.  Please refer to the patient's chart for her  consent to telehealth for [facility].   The patient was identified using 2 identifiers.  Date:  10/29/2019   ID:  Dawn Gonzales, DOB 1940-05-03, MRN [id number]  Patient Location: Home Provider Location: Office  PCP:  Benita Stabile, MD  Cardiologist:  Nona Dell, MD Electrophysiologist:  Lewayne Bunting, MD   Evaluation Performed:  Follow-Up Visit  Chief Complaint:   Cardiac follow-up  History of Present Illness:    Dawn Gonzales is a 79 y.o. female last seen during inpatient consultation in March.  We spoke by phone today.  She reports that she has done reasonably well with low-level activity, no progressive shortness of breath, no palpitations or syncope.  Currently without any abdominal pain.  She has been followed by Dr. Lovell Sheehan although has not yet had to pursue a cholecystectomy, and this would likely be considered in [place] if necessary.  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  She follows with Dr. Ladona Ridgel, Biotronik pacemaker in place.  Most recent device interrogation indicated normal function.  Follow-up echocardiogram  from March is reviewed below.  I reviewed her medications, she is on aspirin and Pravachol, Plavix discontinued at this point.   Past Medical History:  Diagnosis Date  . Anemia   . Anxiety   . Arthritis   . Bilateral renal cysts   . Cholecystitis   . Chronic diastolic heart failure (HCC)   . Depression   . Essential hypertension   . History of cellulitis   . History of CVA (cerebrovascular accident)   . History of endocarditis   . Hyperlipemia   . Morbid obesity (HCC)   . Neuropathy   . Right bundle branch block (RBBB)   . 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 Severe aortic stenosis   . Sleep apnea    Past Surgical History:  Procedure Laterality Date  . Abdominal gangrene    . ADENOIDECTOMY    . Cataract surgery    . COLONOSCOPY    . ENDOSCOPIC RETROGRADE CHOLANGIOPANCREATOGRAPHY (ERCP) WITH PROPOFOL N/A 02/08/2018   Procedure: ENDOSCOPIC RETROGRADE CHOLANGIOPANCREATOGRAPHY (ERCP) WITH PROPOFOL;  Surgeon: Meridee Score Netty Starring., MD;  Location: [facility] ENDOSCOPY;  Service: Gastroenterology;  Laterality: N/A;  . EUS  02/08/2018   Procedure: UPPER ENDOSCOPIC ULTRASOUND (EUS) LINEAR;  Surgeon: Lemar Lofty., MD;  Location: [facility] ENDOSCOPY;  Service: Gastroenterology;;  . EYE SURGERY    . IR FLUORO RM 30-60 MIN  03/27/2018  . IR PERC CHOLECYSTOSTOMY  02/09/2018  . IR RADIOLOGIST EVAL & MGMT  03/26/2018  . IR RADIOLOGY PERIPHERAL GUIDED IV START  05/22/2018  . IR RADIOLOGY PERIPHERAL GUIDED IV START  05/30/2018  . IR RADIOLOGY PERIPHERAL GUIDED IV START  06/05/2018  . IR US GUIDE VASC ACCESS RIGHT  05/22/2018  . IR US GUIDE VASC ACCESS RIGHT  05/30/2018  . IR US GUIDE VASC ACCESS RIGHT  06/05/2018  . PACEMAKER IMPLANT N/A 07/10/2018   Procedure: PACEMAKER IMPLANT;  Surgeon: Marinus Maw, MD;  Location: [facility] INVASIVE CV LAB;  Service: Cardiovascular;   Laterality: N/A;  . REMOVAL OF STONES  02/08/2018   Procedure: REMOVAL OF STONES;  Surgeon: Lemar Lofty., MD;  Location: [facility] ENDOSCOPY;  Service: Gastroenterology;;  . Dawn Gonzales  02/08/2018   Procedure: Dawn Gonzales;  Surgeon: Mansouraty, Netty Starring., MD;  Location: [facility] ENDOSCOPY;  Service: Gastroenterology;;  . TONSILLECTOMY    . TOOTH EXTRACTION N/A 06/18/2018   Procedure: DENTAL RESTORATION/EXTRACTIONS;  Surgeon: Ocie Doyne, DDS;  Location: [facility] OR;  Service: Oral Surgery;  Laterality: N/A;  . TRANSCATHETER AORTIC VALVE REPLACEMENT, TRANSFEMORAL N/A 07/09/2018   Procedure: TRANSCATHETER AORTIC VALVE REPLACEMENT, TRANSFEMORAL;  Surgeon: Tonny Bollman, MD;  Location: [facility] INVASIVE CV LAB;  Service: Cardiovascular;  Laterality: N/A;  . Tummy tuck       Current Meds  Medication Sig  . acetaminophen (TYLENOL) 325 MG tablet Take 2 tablets (650 mg total) by mouth every 6 (six) hours as needed for mild pain, fever or headache (or Fever >/= 101).  Marland Kitchen aspirin EC 81 MG tablet Take 1 tablet (81 mg total) by mouth daily with breakfast.  . Calcium Carbonate (CALCIUM-CARB 600 PO) Take 600 mg by mouth daily.  . cholecalciferol (VITAMIN D) 1000 units tablet Take 1,000 Units by mouth daily.  . ferrous sulfate 325 (65 FE) MG tablet Take 325 mg by mouth daily with breakfast.  . gabapentin (NEURONTIN) 300 MG capsule Take 300 mg by mouth 3 (three) times daily.  . metoCLOPramide (REGLAN) 10 MG tablet Take 1 tablet (10 mg total) by mouth every 6 (six) hours as needed for nausea (nausea/headache).  . Multiple Vitamin (MULTIVITAMIN WITH MINERALS) TABS tablet Take 1 tablet by mouth daily.  . ondansetron (ZOFRAN ODT) 4 MG disintegrating tablet 4mg  ODT q4 hours prn nausea/vomit  . polyethylene glycol (MIRALAX MIX-IN PAX) 17 g packet Take 17 g by mouth daily as needed for mild constipation or moderate constipation.  . pravastatin (PRAVACHOL) 20 MG tablet Take 20 mg by mouth daily.  . silver sulfADIAZINE  (SILVADENE) 1 % cream Apply 1 application topically 2 (two) times daily.  . traMADol (ULTRAM) 50 MG tablet Take 1 tablet (50 mg total) by mouth every 6 (six) hours as needed for moderate pain.  venlafaxine (EFFEXOR) 37.5 MG tablet Take 37.5 mg by mouth daily.  . vitamin C (ASCORBIC ACID) 500 MG tablet Take 500 mg by mouth daily.     Allergies:   Patient has no known allergies.   ROS:   No palpitations or syncope.  Prior CV studies:   The following studies were reviewed today:  Echocardiogram 07/06/2019: 1. Left ventricular ejection fraction, by estimation, is 60 to 65%. The  left ventricle has normal function. The left ventricle has no regional  wall motion abnormalities. There is mild left ventricular hypertrophy.  Left ventricular diastolic parameters  are indeterminate.  2. Right ventricular systolic function is normal. The right ventricular  size is normal. Tricuspid regurgitation signal is inadequate for assessing  PA pressure.  3. Left atrial size was severely dilated.  4. Right atrial size was mildly dilated.  5.  The mitral valve is degenerative with mild thickening and  calcification associated with severe annular calcification. Mild mitral  valve regurgitation. Mild mitral stenosis. The mean mitral valve gradient  is 4.0 mmHg.  6. The aortic valve has been repaired/replaced. There is a 26 mm Edwards  Sapien 3 prosthesis in position. Aortic valve regurgitation is mild to  moderate - perivalvular near septum. Stable mean gradient of 19 mmHg.  7. The inferior vena cava is dilated in size with >50% respiratory  variability, suggesting right atrial pressure of 8 mmHg.   Labs/Other Tests and Data Reviewed:    EKG:  An ECG dated 08/27/2019 was personally reviewed today and demonstrated:  Sinus rhythm with right bundle branch block and leftward axis.  Recent Labs: 08/23/2019: ALT 15 08/26/2019: B Natriuretic Peptide 120.0 08/27/2019: Magnesium 1.6 08/28/2019: BUN 31;  Creatinine, Ser 0.75; Hemoglobin 11.0; Platelets 234; Potassium 3.5; Sodium 140    Wt Readings from Last 3 Encounters:  08/27/19 272 lb 11.3 oz (123.7 kg)  08/15/19 268 lb (121.6 kg)  07/24/19 282 lb 3 oz (128 kg)     Objective:    Vital Signs:  Ht 5\' 6"  (1.676 m)   BMI 44.02 kg/m    Unable to obtain vital signs today. Patient spoke in full sentences, not short of breath.  ASSESSMENT & PLAN:    1.  History of severe aortic stenosis status post TAVR in March 2020.  She is no longer on Plavix, continues on aspirin.  Follow-up echocardiogram in March showed normal LVEF at 60 to 65% with stable aortic prosthesis associated with mild to moderate perivalvular regurgitation and mean gradient 19 mmHg.  2.  Biotronik pacemaker in place with history of complete heart block following TAVR.  She continues to follow with Dr. April, recent device interrogation normal.  3.  Previous history of mitral valve endocarditis.  4.  Mixed hyperlipidemia, on Pravachol.  She continues to follow with Dr. Ladona Ridgel.   Time:   Today, I have spent 5 minutes with the patient with telehealth technology discussing the above problems.     Medication Adjustments/Labs and Tests Ordered: Current medicines are reviewed at length with the patient today.  Concerns regarding medicines are outlined above.   Tests Ordered: No orders of the defined types were placed in this encounter.   Medication Changes: No orders of the defined types were placed in this encounter.   Follow Up:  Either In Person or Virtual 6 months.  Signed, Margo Aye, MD  10/29/2019 9:52 AM    [facility]

## 2019-10-31 DIAGNOSIS — I69398 Other sequelae of cerebral infarction: Secondary | ICD-10-CM | POA: Diagnosis not present

## 2019-10-31 DIAGNOSIS — G629 Polyneuropathy, unspecified: Secondary | ICD-10-CM | POA: Diagnosis not present

## 2019-10-31 DIAGNOSIS — Z6841 Body Mass Index (BMI) 40.0 and over, adult: Secondary | ICD-10-CM | POA: Diagnosis not present

## 2019-10-31 DIAGNOSIS — M170 Bilateral primary osteoarthritis of knee: Secondary | ICD-10-CM | POA: Diagnosis not present

## 2019-10-31 DIAGNOSIS — R29898 Other symptoms and signs involving the musculoskeletal system: Secondary | ICD-10-CM | POA: Diagnosis not present

## 2019-10-31 DIAGNOSIS — E662 Morbid (severe) obesity with alveolar hypoventilation: Secondary | ICD-10-CM | POA: Diagnosis not present

## 2019-10-31 DIAGNOSIS — J9621 Acute and chronic respiratory failure with hypoxia: Secondary | ICD-10-CM | POA: Diagnosis not present

## 2019-10-31 DIAGNOSIS — I110 Hypertensive heart disease with heart failure: Secondary | ICD-10-CM | POA: Diagnosis not present

## 2019-10-31 DIAGNOSIS — I5032 Chronic diastolic (congestive) heart failure: Secondary | ICD-10-CM | POA: Diagnosis not present

## 2019-11-07 DIAGNOSIS — G629 Polyneuropathy, unspecified: Secondary | ICD-10-CM | POA: Diagnosis not present

## 2019-11-07 DIAGNOSIS — R29898 Other symptoms and signs involving the musculoskeletal system: Secondary | ICD-10-CM | POA: Diagnosis not present

## 2019-11-07 DIAGNOSIS — I69398 Other sequelae of cerebral infarction: Secondary | ICD-10-CM | POA: Diagnosis not present

## 2019-11-07 DIAGNOSIS — M170 Bilateral primary osteoarthritis of knee: Secondary | ICD-10-CM | POA: Diagnosis not present

## 2019-11-07 DIAGNOSIS — E662 Morbid (severe) obesity with alveolar hypoventilation: Secondary | ICD-10-CM | POA: Diagnosis not present

## 2019-11-07 DIAGNOSIS — Z6841 Body Mass Index (BMI) 40.0 and over, adult: Secondary | ICD-10-CM | POA: Diagnosis not present

## 2019-11-07 DIAGNOSIS — I5032 Chronic diastolic (congestive) heart failure: Secondary | ICD-10-CM | POA: Diagnosis not present

## 2019-11-07 DIAGNOSIS — I110 Hypertensive heart disease with heart failure: Secondary | ICD-10-CM | POA: Diagnosis not present

## 2019-11-07 DIAGNOSIS — J9621 Acute and chronic respiratory failure with hypoxia: Secondary | ICD-10-CM | POA: Diagnosis not present

## 2019-11-13 DIAGNOSIS — G629 Polyneuropathy, unspecified: Secondary | ICD-10-CM | POA: Diagnosis not present

## 2019-11-13 DIAGNOSIS — E662 Morbid (severe) obesity with alveolar hypoventilation: Secondary | ICD-10-CM | POA: Diagnosis not present

## 2019-11-13 DIAGNOSIS — I5032 Chronic diastolic (congestive) heart failure: Secondary | ICD-10-CM | POA: Diagnosis not present

## 2019-11-13 DIAGNOSIS — R29898 Other symptoms and signs involving the musculoskeletal system: Secondary | ICD-10-CM | POA: Diagnosis not present

## 2019-11-13 DIAGNOSIS — J9621 Acute and chronic respiratory failure with hypoxia: Secondary | ICD-10-CM | POA: Diagnosis not present

## 2019-11-13 DIAGNOSIS — Z6841 Body Mass Index (BMI) 40.0 and over, adult: Secondary | ICD-10-CM | POA: Diagnosis not present

## 2019-11-13 DIAGNOSIS — I110 Hypertensive heart disease with heart failure: Secondary | ICD-10-CM | POA: Diagnosis not present

## 2019-11-13 DIAGNOSIS — M170 Bilateral primary osteoarthritis of knee: Secondary | ICD-10-CM | POA: Diagnosis not present

## 2019-11-13 DIAGNOSIS — I69398 Other sequelae of cerebral infarction: Secondary | ICD-10-CM | POA: Diagnosis not present

## 2019-11-14 ENCOUNTER — Ambulatory Visit: Payer: Medicare PPO | Admitting: Gastroenterology

## 2019-11-16 DIAGNOSIS — I5032 Chronic diastolic (congestive) heart failure: Secondary | ICD-10-CM | POA: Diagnosis not present

## 2019-11-16 DIAGNOSIS — Z6841 Body Mass Index (BMI) 40.0 and over, adult: Secondary | ICD-10-CM | POA: Diagnosis not present

## 2019-11-16 DIAGNOSIS — R29898 Other symptoms and signs involving the musculoskeletal system: Secondary | ICD-10-CM | POA: Diagnosis not present

## 2019-11-16 DIAGNOSIS — I110 Hypertensive heart disease with heart failure: Secondary | ICD-10-CM | POA: Diagnosis not present

## 2019-11-16 DIAGNOSIS — E662 Morbid (severe) obesity with alveolar hypoventilation: Secondary | ICD-10-CM | POA: Diagnosis not present

## 2019-11-16 DIAGNOSIS — J9621 Acute and chronic respiratory failure with hypoxia: Secondary | ICD-10-CM | POA: Diagnosis not present

## 2019-11-16 DIAGNOSIS — M170 Bilateral primary osteoarthritis of knee: Secondary | ICD-10-CM | POA: Diagnosis not present

## 2019-11-16 DIAGNOSIS — I69398 Other sequelae of cerebral infarction: Secondary | ICD-10-CM | POA: Diagnosis not present

## 2019-11-16 DIAGNOSIS — G629 Polyneuropathy, unspecified: Secondary | ICD-10-CM | POA: Diagnosis not present

## 2019-11-18 DIAGNOSIS — I110 Hypertensive heart disease with heart failure: Secondary | ICD-10-CM | POA: Diagnosis not present

## 2019-11-18 DIAGNOSIS — M170 Bilateral primary osteoarthritis of knee: Secondary | ICD-10-CM | POA: Diagnosis not present

## 2019-11-18 DIAGNOSIS — I69398 Other sequelae of cerebral infarction: Secondary | ICD-10-CM | POA: Diagnosis not present

## 2019-11-18 DIAGNOSIS — I5032 Chronic diastolic (congestive) heart failure: Secondary | ICD-10-CM | POA: Diagnosis not present

## 2019-11-18 DIAGNOSIS — E662 Morbid (severe) obesity with alveolar hypoventilation: Secondary | ICD-10-CM | POA: Diagnosis not present

## 2019-11-18 DIAGNOSIS — G629 Polyneuropathy, unspecified: Secondary | ICD-10-CM | POA: Diagnosis not present

## 2019-11-18 DIAGNOSIS — Z6841 Body Mass Index (BMI) 40.0 and over, adult: Secondary | ICD-10-CM | POA: Diagnosis not present

## 2019-11-18 DIAGNOSIS — J9621 Acute and chronic respiratory failure with hypoxia: Secondary | ICD-10-CM | POA: Diagnosis not present

## 2019-11-18 DIAGNOSIS — R29898 Other symptoms and signs involving the musculoskeletal system: Secondary | ICD-10-CM | POA: Diagnosis not present

## 2019-11-20 DIAGNOSIS — I5032 Chronic diastolic (congestive) heart failure: Secondary | ICD-10-CM | POA: Diagnosis not present

## 2019-11-20 DIAGNOSIS — J9621 Acute and chronic respiratory failure with hypoxia: Secondary | ICD-10-CM | POA: Diagnosis not present

## 2019-11-20 DIAGNOSIS — E662 Morbid (severe) obesity with alveolar hypoventilation: Secondary | ICD-10-CM | POA: Diagnosis not present

## 2019-11-20 DIAGNOSIS — M170 Bilateral primary osteoarthritis of knee: Secondary | ICD-10-CM | POA: Diagnosis not present

## 2019-11-20 DIAGNOSIS — Z6841 Body Mass Index (BMI) 40.0 and over, adult: Secondary | ICD-10-CM | POA: Diagnosis not present

## 2019-11-20 DIAGNOSIS — R29898 Other symptoms and signs involving the musculoskeletal system: Secondary | ICD-10-CM | POA: Diagnosis not present

## 2019-11-20 DIAGNOSIS — I110 Hypertensive heart disease with heart failure: Secondary | ICD-10-CM | POA: Diagnosis not present

## 2019-11-20 DIAGNOSIS — I69398 Other sequelae of cerebral infarction: Secondary | ICD-10-CM | POA: Diagnosis not present

## 2019-11-20 DIAGNOSIS — G629 Polyneuropathy, unspecified: Secondary | ICD-10-CM | POA: Diagnosis not present

## 2019-11-26 DIAGNOSIS — J9621 Acute and chronic respiratory failure with hypoxia: Secondary | ICD-10-CM | POA: Diagnosis not present

## 2019-11-26 DIAGNOSIS — I110 Hypertensive heart disease with heart failure: Secondary | ICD-10-CM | POA: Diagnosis not present

## 2019-11-26 DIAGNOSIS — G629 Polyneuropathy, unspecified: Secondary | ICD-10-CM | POA: Diagnosis not present

## 2019-11-26 DIAGNOSIS — I69398 Other sequelae of cerebral infarction: Secondary | ICD-10-CM | POA: Diagnosis not present

## 2019-11-26 DIAGNOSIS — M170 Bilateral primary osteoarthritis of knee: Secondary | ICD-10-CM | POA: Diagnosis not present

## 2019-11-26 DIAGNOSIS — I5032 Chronic diastolic (congestive) heart failure: Secondary | ICD-10-CM | POA: Diagnosis not present

## 2019-11-26 DIAGNOSIS — R29898 Other symptoms and signs involving the musculoskeletal system: Secondary | ICD-10-CM | POA: Diagnosis not present

## 2019-11-26 DIAGNOSIS — Z6841 Body Mass Index (BMI) 40.0 and over, adult: Secondary | ICD-10-CM | POA: Diagnosis not present

## 2019-11-26 DIAGNOSIS — E662 Morbid (severe) obesity with alveolar hypoventilation: Secondary | ICD-10-CM | POA: Diagnosis not present

## 2019-11-27 DIAGNOSIS — M170 Bilateral primary osteoarthritis of knee: Secondary | ICD-10-CM | POA: Diagnosis not present

## 2019-11-27 DIAGNOSIS — Z6841 Body Mass Index (BMI) 40.0 and over, adult: Secondary | ICD-10-CM | POA: Diagnosis not present

## 2019-11-27 DIAGNOSIS — I69398 Other sequelae of cerebral infarction: Secondary | ICD-10-CM | POA: Diagnosis not present

## 2019-11-27 DIAGNOSIS — I110 Hypertensive heart disease with heart failure: Secondary | ICD-10-CM | POA: Diagnosis not present

## 2019-11-27 DIAGNOSIS — E662 Morbid (severe) obesity with alveolar hypoventilation: Secondary | ICD-10-CM | POA: Diagnosis not present

## 2019-11-27 DIAGNOSIS — I5032 Chronic diastolic (congestive) heart failure: Secondary | ICD-10-CM | POA: Diagnosis not present

## 2019-11-27 DIAGNOSIS — J9621 Acute and chronic respiratory failure with hypoxia: Secondary | ICD-10-CM | POA: Diagnosis not present

## 2019-11-27 DIAGNOSIS — G629 Polyneuropathy, unspecified: Secondary | ICD-10-CM | POA: Diagnosis not present

## 2019-11-27 DIAGNOSIS — R29898 Other symptoms and signs involving the musculoskeletal system: Secondary | ICD-10-CM | POA: Diagnosis not present

## 2019-11-28 DIAGNOSIS — I5032 Chronic diastolic (congestive) heart failure: Secondary | ICD-10-CM | POA: Diagnosis not present

## 2019-11-28 DIAGNOSIS — R29898 Other symptoms and signs involving the musculoskeletal system: Secondary | ICD-10-CM | POA: Diagnosis not present

## 2019-11-28 DIAGNOSIS — I110 Hypertensive heart disease with heart failure: Secondary | ICD-10-CM | POA: Diagnosis not present

## 2019-11-28 DIAGNOSIS — I69398 Other sequelae of cerebral infarction: Secondary | ICD-10-CM | POA: Diagnosis not present

## 2019-11-28 DIAGNOSIS — M170 Bilateral primary osteoarthritis of knee: Secondary | ICD-10-CM | POA: Diagnosis not present

## 2019-11-28 DIAGNOSIS — Z6841 Body Mass Index (BMI) 40.0 and over, adult: Secondary | ICD-10-CM | POA: Diagnosis not present

## 2019-11-28 DIAGNOSIS — J9621 Acute and chronic respiratory failure with hypoxia: Secondary | ICD-10-CM | POA: Diagnosis not present

## 2019-11-28 DIAGNOSIS — G629 Polyneuropathy, unspecified: Secondary | ICD-10-CM | POA: Diagnosis not present

## 2019-11-28 DIAGNOSIS — E662 Morbid (severe) obesity with alveolar hypoventilation: Secondary | ICD-10-CM | POA: Diagnosis not present

## 2019-12-01 DIAGNOSIS — I5032 Chronic diastolic (congestive) heart failure: Secondary | ICD-10-CM | POA: Diagnosis not present

## 2019-12-01 DIAGNOSIS — I110 Hypertensive heart disease with heart failure: Secondary | ICD-10-CM | POA: Diagnosis not present

## 2019-12-01 DIAGNOSIS — R29898 Other symptoms and signs involving the musculoskeletal system: Secondary | ICD-10-CM | POA: Diagnosis not present

## 2019-12-01 DIAGNOSIS — E662 Morbid (severe) obesity with alveolar hypoventilation: Secondary | ICD-10-CM | POA: Diagnosis not present

## 2019-12-01 DIAGNOSIS — J9621 Acute and chronic respiratory failure with hypoxia: Secondary | ICD-10-CM | POA: Diagnosis not present

## 2019-12-01 DIAGNOSIS — M170 Bilateral primary osteoarthritis of knee: Secondary | ICD-10-CM | POA: Diagnosis not present

## 2019-12-01 DIAGNOSIS — I69398 Other sequelae of cerebral infarction: Secondary | ICD-10-CM | POA: Diagnosis not present

## 2019-12-01 DIAGNOSIS — F331 Major depressive disorder, recurrent, moderate: Secondary | ICD-10-CM | POA: Diagnosis not present

## 2019-12-01 DIAGNOSIS — I700 Atherosclerosis of aorta: Secondary | ICD-10-CM | POA: Diagnosis not present

## 2019-12-02 DIAGNOSIS — I110 Hypertensive heart disease with heart failure: Secondary | ICD-10-CM | POA: Diagnosis not present

## 2019-12-02 DIAGNOSIS — E662 Morbid (severe) obesity with alveolar hypoventilation: Secondary | ICD-10-CM | POA: Diagnosis not present

## 2019-12-02 DIAGNOSIS — M170 Bilateral primary osteoarthritis of knee: Secondary | ICD-10-CM | POA: Diagnosis not present

## 2019-12-02 DIAGNOSIS — I69398 Other sequelae of cerebral infarction: Secondary | ICD-10-CM | POA: Diagnosis not present

## 2019-12-02 DIAGNOSIS — J9621 Acute and chronic respiratory failure with hypoxia: Secondary | ICD-10-CM | POA: Diagnosis not present

## 2019-12-02 DIAGNOSIS — G629 Polyneuropathy, unspecified: Secondary | ICD-10-CM | POA: Diagnosis not present

## 2019-12-02 DIAGNOSIS — I5032 Chronic diastolic (congestive) heart failure: Secondary | ICD-10-CM | POA: Diagnosis not present

## 2019-12-02 DIAGNOSIS — Z6841 Body Mass Index (BMI) 40.0 and over, adult: Secondary | ICD-10-CM | POA: Diagnosis not present

## 2019-12-02 DIAGNOSIS — R29898 Other symptoms and signs involving the musculoskeletal system: Secondary | ICD-10-CM | POA: Diagnosis not present

## 2019-12-04 DIAGNOSIS — J9621 Acute and chronic respiratory failure with hypoxia: Secondary | ICD-10-CM | POA: Diagnosis not present

## 2019-12-04 DIAGNOSIS — I69398 Other sequelae of cerebral infarction: Secondary | ICD-10-CM | POA: Diagnosis not present

## 2019-12-04 DIAGNOSIS — G629 Polyneuropathy, unspecified: Secondary | ICD-10-CM | POA: Diagnosis not present

## 2019-12-04 DIAGNOSIS — R29898 Other symptoms and signs involving the musculoskeletal system: Secondary | ICD-10-CM | POA: Diagnosis not present

## 2019-12-04 DIAGNOSIS — M170 Bilateral primary osteoarthritis of knee: Secondary | ICD-10-CM | POA: Diagnosis not present

## 2019-12-04 DIAGNOSIS — I5032 Chronic diastolic (congestive) heart failure: Secondary | ICD-10-CM | POA: Diagnosis not present

## 2019-12-04 DIAGNOSIS — E662 Morbid (severe) obesity with alveolar hypoventilation: Secondary | ICD-10-CM | POA: Diagnosis not present

## 2019-12-04 DIAGNOSIS — Z6841 Body Mass Index (BMI) 40.0 and over, adult: Secondary | ICD-10-CM | POA: Diagnosis not present

## 2019-12-04 DIAGNOSIS — I110 Hypertensive heart disease with heart failure: Secondary | ICD-10-CM | POA: Diagnosis not present

## 2019-12-11 DIAGNOSIS — I110 Hypertensive heart disease with heart failure: Secondary | ICD-10-CM | POA: Diagnosis not present

## 2019-12-11 DIAGNOSIS — I5032 Chronic diastolic (congestive) heart failure: Secondary | ICD-10-CM | POA: Diagnosis not present

## 2019-12-11 DIAGNOSIS — M170 Bilateral primary osteoarthritis of knee: Secondary | ICD-10-CM | POA: Diagnosis not present

## 2019-12-11 DIAGNOSIS — Z6841 Body Mass Index (BMI) 40.0 and over, adult: Secondary | ICD-10-CM | POA: Diagnosis not present

## 2019-12-11 DIAGNOSIS — I69398 Other sequelae of cerebral infarction: Secondary | ICD-10-CM | POA: Diagnosis not present

## 2019-12-11 DIAGNOSIS — J9621 Acute and chronic respiratory failure with hypoxia: Secondary | ICD-10-CM | POA: Diagnosis not present

## 2019-12-11 DIAGNOSIS — E662 Morbid (severe) obesity with alveolar hypoventilation: Secondary | ICD-10-CM | POA: Diagnosis not present

## 2019-12-11 DIAGNOSIS — R29898 Other symptoms and signs involving the musculoskeletal system: Secondary | ICD-10-CM | POA: Diagnosis not present

## 2019-12-11 DIAGNOSIS — G629 Polyneuropathy, unspecified: Secondary | ICD-10-CM | POA: Diagnosis not present

## 2019-12-12 DIAGNOSIS — G629 Polyneuropathy, unspecified: Secondary | ICD-10-CM | POA: Diagnosis not present

## 2019-12-12 DIAGNOSIS — M170 Bilateral primary osteoarthritis of knee: Secondary | ICD-10-CM | POA: Diagnosis not present

## 2019-12-12 DIAGNOSIS — E662 Morbid (severe) obesity with alveolar hypoventilation: Secondary | ICD-10-CM | POA: Diagnosis not present

## 2019-12-12 DIAGNOSIS — I5032 Chronic diastolic (congestive) heart failure: Secondary | ICD-10-CM | POA: Diagnosis not present

## 2019-12-12 DIAGNOSIS — I69398 Other sequelae of cerebral infarction: Secondary | ICD-10-CM | POA: Diagnosis not present

## 2019-12-12 DIAGNOSIS — R29898 Other symptoms and signs involving the musculoskeletal system: Secondary | ICD-10-CM | POA: Diagnosis not present

## 2019-12-12 DIAGNOSIS — Z6841 Body Mass Index (BMI) 40.0 and over, adult: Secondary | ICD-10-CM | POA: Diagnosis not present

## 2019-12-12 DIAGNOSIS — J9621 Acute and chronic respiratory failure with hypoxia: Secondary | ICD-10-CM | POA: Diagnosis not present

## 2019-12-12 DIAGNOSIS — I110 Hypertensive heart disease with heart failure: Secondary | ICD-10-CM | POA: Diagnosis not present

## 2019-12-15 DIAGNOSIS — N390 Urinary tract infection, site not specified: Secondary | ICD-10-CM | POA: Diagnosis not present

## 2019-12-15 DIAGNOSIS — K8000 Calculus of gallbladder with acute cholecystitis without obstruction: Secondary | ICD-10-CM | POA: Diagnosis not present

## 2019-12-15 DIAGNOSIS — G9009 Other idiopathic peripheral autonomic neuropathy: Secondary | ICD-10-CM | POA: Diagnosis not present

## 2019-12-15 DIAGNOSIS — I350 Nonrheumatic aortic (valve) stenosis: Secondary | ICD-10-CM | POA: Diagnosis not present

## 2019-12-15 DIAGNOSIS — Z23 Encounter for immunization: Secondary | ICD-10-CM | POA: Diagnosis not present

## 2019-12-15 DIAGNOSIS — G8929 Other chronic pain: Secondary | ICD-10-CM | POA: Diagnosis not present

## 2019-12-15 DIAGNOSIS — L03115 Cellulitis of right lower limb: Secondary | ICD-10-CM | POA: Diagnosis not present

## 2019-12-15 DIAGNOSIS — N2889 Other specified disorders of kidney and ureter: Secondary | ICD-10-CM | POA: Diagnosis not present

## 2019-12-15 DIAGNOSIS — L89892 Pressure ulcer of other site, stage 2: Secondary | ICD-10-CM | POA: Diagnosis not present

## 2019-12-15 DIAGNOSIS — G4730 Sleep apnea, unspecified: Secondary | ICD-10-CM | POA: Diagnosis not present

## 2019-12-15 DIAGNOSIS — Z6841 Body Mass Index (BMI) 40.0 and over, adult: Secondary | ICD-10-CM | POA: Diagnosis not present

## 2019-12-15 DIAGNOSIS — Z0000 Encounter for general adult medical examination without abnormal findings: Secondary | ICD-10-CM | POA: Diagnosis not present

## 2019-12-16 DIAGNOSIS — Z6841 Body Mass Index (BMI) 40.0 and over, adult: Secondary | ICD-10-CM | POA: Diagnosis not present

## 2019-12-16 DIAGNOSIS — I110 Hypertensive heart disease with heart failure: Secondary | ICD-10-CM | POA: Diagnosis not present

## 2019-12-16 DIAGNOSIS — I69398 Other sequelae of cerebral infarction: Secondary | ICD-10-CM | POA: Diagnosis not present

## 2019-12-16 DIAGNOSIS — I5032 Chronic diastolic (congestive) heart failure: Secondary | ICD-10-CM | POA: Diagnosis not present

## 2019-12-16 DIAGNOSIS — J9621 Acute and chronic respiratory failure with hypoxia: Secondary | ICD-10-CM | POA: Diagnosis not present

## 2019-12-16 DIAGNOSIS — G629 Polyneuropathy, unspecified: Secondary | ICD-10-CM | POA: Diagnosis not present

## 2019-12-16 DIAGNOSIS — E662 Morbid (severe) obesity with alveolar hypoventilation: Secondary | ICD-10-CM | POA: Diagnosis not present

## 2019-12-16 DIAGNOSIS — M170 Bilateral primary osteoarthritis of knee: Secondary | ICD-10-CM | POA: Diagnosis not present

## 2019-12-16 DIAGNOSIS — R29898 Other symptoms and signs involving the musculoskeletal system: Secondary | ICD-10-CM | POA: Diagnosis not present

## 2019-12-17 DIAGNOSIS — G629 Polyneuropathy, unspecified: Secondary | ICD-10-CM | POA: Diagnosis not present

## 2019-12-17 DIAGNOSIS — J9621 Acute and chronic respiratory failure with hypoxia: Secondary | ICD-10-CM | POA: Diagnosis not present

## 2019-12-17 DIAGNOSIS — Z6841 Body Mass Index (BMI) 40.0 and over, adult: Secondary | ICD-10-CM | POA: Diagnosis not present

## 2019-12-17 DIAGNOSIS — I69398 Other sequelae of cerebral infarction: Secondary | ICD-10-CM | POA: Diagnosis not present

## 2019-12-17 DIAGNOSIS — E662 Morbid (severe) obesity with alveolar hypoventilation: Secondary | ICD-10-CM | POA: Diagnosis not present

## 2019-12-17 DIAGNOSIS — M170 Bilateral primary osteoarthritis of knee: Secondary | ICD-10-CM | POA: Diagnosis not present

## 2019-12-17 DIAGNOSIS — I110 Hypertensive heart disease with heart failure: Secondary | ICD-10-CM | POA: Diagnosis not present

## 2019-12-17 DIAGNOSIS — I5032 Chronic diastolic (congestive) heart failure: Secondary | ICD-10-CM | POA: Diagnosis not present

## 2019-12-17 DIAGNOSIS — R29898 Other symptoms and signs involving the musculoskeletal system: Secondary | ICD-10-CM | POA: Diagnosis not present

## 2019-12-22 DIAGNOSIS — J9621 Acute and chronic respiratory failure with hypoxia: Secondary | ICD-10-CM | POA: Diagnosis not present

## 2019-12-22 DIAGNOSIS — G629 Polyneuropathy, unspecified: Secondary | ICD-10-CM | POA: Diagnosis not present

## 2019-12-22 DIAGNOSIS — Z6841 Body Mass Index (BMI) 40.0 and over, adult: Secondary | ICD-10-CM | POA: Diagnosis not present

## 2019-12-22 DIAGNOSIS — I69398 Other sequelae of cerebral infarction: Secondary | ICD-10-CM | POA: Diagnosis not present

## 2019-12-22 DIAGNOSIS — I5032 Chronic diastolic (congestive) heart failure: Secondary | ICD-10-CM | POA: Diagnosis not present

## 2019-12-22 DIAGNOSIS — I110 Hypertensive heart disease with heart failure: Secondary | ICD-10-CM | POA: Diagnosis not present

## 2019-12-22 DIAGNOSIS — R29898 Other symptoms and signs involving the musculoskeletal system: Secondary | ICD-10-CM | POA: Diagnosis not present

## 2019-12-22 DIAGNOSIS — M170 Bilateral primary osteoarthritis of knee: Secondary | ICD-10-CM | POA: Diagnosis not present

## 2019-12-22 DIAGNOSIS — E662 Morbid (severe) obesity with alveolar hypoventilation: Secondary | ICD-10-CM | POA: Diagnosis not present

## 2019-12-25 DIAGNOSIS — J9621 Acute and chronic respiratory failure with hypoxia: Secondary | ICD-10-CM | POA: Diagnosis not present

## 2019-12-25 DIAGNOSIS — M170 Bilateral primary osteoarthritis of knee: Secondary | ICD-10-CM | POA: Diagnosis not present

## 2019-12-25 DIAGNOSIS — E662 Morbid (severe) obesity with alveolar hypoventilation: Secondary | ICD-10-CM | POA: Diagnosis not present

## 2019-12-25 DIAGNOSIS — I110 Hypertensive heart disease with heart failure: Secondary | ICD-10-CM | POA: Diagnosis not present

## 2019-12-25 DIAGNOSIS — I69398 Other sequelae of cerebral infarction: Secondary | ICD-10-CM | POA: Diagnosis not present

## 2019-12-25 DIAGNOSIS — R29898 Other symptoms and signs involving the musculoskeletal system: Secondary | ICD-10-CM | POA: Diagnosis not present

## 2019-12-25 DIAGNOSIS — I5032 Chronic diastolic (congestive) heart failure: Secondary | ICD-10-CM | POA: Diagnosis not present

## 2019-12-25 DIAGNOSIS — G629 Polyneuropathy, unspecified: Secondary | ICD-10-CM | POA: Diagnosis not present

## 2019-12-25 DIAGNOSIS — Z6841 Body Mass Index (BMI) 40.0 and over, adult: Secondary | ICD-10-CM | POA: Diagnosis not present

## 2020-01-06 DIAGNOSIS — I350 Nonrheumatic aortic (valve) stenosis: Secondary | ICD-10-CM | POA: Diagnosis not present

## 2020-01-06 DIAGNOSIS — E559 Vitamin D deficiency, unspecified: Secondary | ICD-10-CM | POA: Diagnosis not present

## 2020-01-06 DIAGNOSIS — Z23 Encounter for immunization: Secondary | ICD-10-CM | POA: Diagnosis not present

## 2020-01-06 DIAGNOSIS — G9009 Other idiopathic peripheral autonomic neuropathy: Secondary | ICD-10-CM | POA: Diagnosis not present

## 2020-01-06 DIAGNOSIS — G4730 Sleep apnea, unspecified: Secondary | ICD-10-CM | POA: Diagnosis not present

## 2020-01-06 DIAGNOSIS — Z6841 Body Mass Index (BMI) 40.0 and over, adult: Secondary | ICD-10-CM | POA: Diagnosis not present

## 2020-01-06 DIAGNOSIS — L03115 Cellulitis of right lower limb: Secondary | ICD-10-CM | POA: Diagnosis not present

## 2020-01-06 DIAGNOSIS — Z0000 Encounter for general adult medical examination without abnormal findings: Secondary | ICD-10-CM | POA: Diagnosis not present

## 2020-01-06 DIAGNOSIS — K8000 Calculus of gallbladder with acute cholecystitis without obstruction: Secondary | ICD-10-CM | POA: Diagnosis not present

## 2020-01-12 ENCOUNTER — Ambulatory Visit (INDEPENDENT_AMBULATORY_CARE_PROVIDER_SITE_OTHER): Payer: Medicare PPO | Admitting: *Deleted

## 2020-01-12 DIAGNOSIS — I442 Atrioventricular block, complete: Secondary | ICD-10-CM | POA: Diagnosis not present

## 2020-01-12 LAB — CUP PACEART REMOTE DEVICE CHECK
Date Time Interrogation Session: 20210913112415
Implantable Lead Implant Date: 20200311
Implantable Lead Implant Date: 20200311
Implantable Lead Location: 753859
Implantable Lead Location: 753860
Implantable Lead Model: 377
Implantable Lead Model: 377
Implantable Lead Serial Number: 81024123
Implantable Lead Serial Number: 81075341
Implantable Pulse Generator Implant Date: 20200311
Pulse Gen Model: 407145
Pulse Gen Serial Number: 69550148

## 2020-01-14 DIAGNOSIS — Z952 Presence of prosthetic heart valve: Secondary | ICD-10-CM | POA: Diagnosis not present

## 2020-01-14 DIAGNOSIS — M170 Bilateral primary osteoarthritis of knee: Secondary | ICD-10-CM | POA: Diagnosis not present

## 2020-01-14 DIAGNOSIS — G629 Polyneuropathy, unspecified: Secondary | ICD-10-CM | POA: Diagnosis not present

## 2020-01-14 DIAGNOSIS — Z0001 Encounter for general adult medical examination with abnormal findings: Secondary | ICD-10-CM | POA: Diagnosis not present

## 2020-01-14 DIAGNOSIS — Z23 Encounter for immunization: Secondary | ICD-10-CM | POA: Diagnosis not present

## 2020-01-14 DIAGNOSIS — F331 Major depressive disorder, recurrent, moderate: Secondary | ICD-10-CM | POA: Diagnosis not present

## 2020-01-14 DIAGNOSIS — E782 Mixed hyperlipidemia: Secondary | ICD-10-CM | POA: Diagnosis not present

## 2020-01-14 DIAGNOSIS — I712 Thoracic aortic aneurysm, without rupture: Secondary | ICD-10-CM | POA: Diagnosis not present

## 2020-01-14 DIAGNOSIS — I350 Nonrheumatic aortic (valve) stenosis: Secondary | ICD-10-CM | POA: Diagnosis not present

## 2020-01-14 NOTE — Progress Notes (Signed)
Remote pacemaker transmission.   

## 2020-01-29 DIAGNOSIS — I350 Nonrheumatic aortic (valve) stenosis: Secondary | ICD-10-CM | POA: Diagnosis not present

## 2020-01-29 DIAGNOSIS — M180 Bilateral primary osteoarthritis of first carpometacarpal joints: Secondary | ICD-10-CM | POA: Diagnosis not present

## 2020-01-29 DIAGNOSIS — M170 Bilateral primary osteoarthritis of knee: Secondary | ICD-10-CM | POA: Diagnosis not present

## 2020-01-29 DIAGNOSIS — E782 Mixed hyperlipidemia: Secondary | ICD-10-CM | POA: Diagnosis not present

## 2020-01-29 DIAGNOSIS — F331 Major depressive disorder, recurrent, moderate: Secondary | ICD-10-CM | POA: Diagnosis not present

## 2020-01-29 DIAGNOSIS — G629 Polyneuropathy, unspecified: Secondary | ICD-10-CM | POA: Diagnosis not present

## 2020-02-11 IMAGING — DX CHEST - 2 VIEW
2 series · 2 of 2 positions shown · non-contrast
Comparison: Two days ago

CLINICAL DATA: Pacemaker placement

EXAM:
CHEST - 2 VIEW

[chest ap]
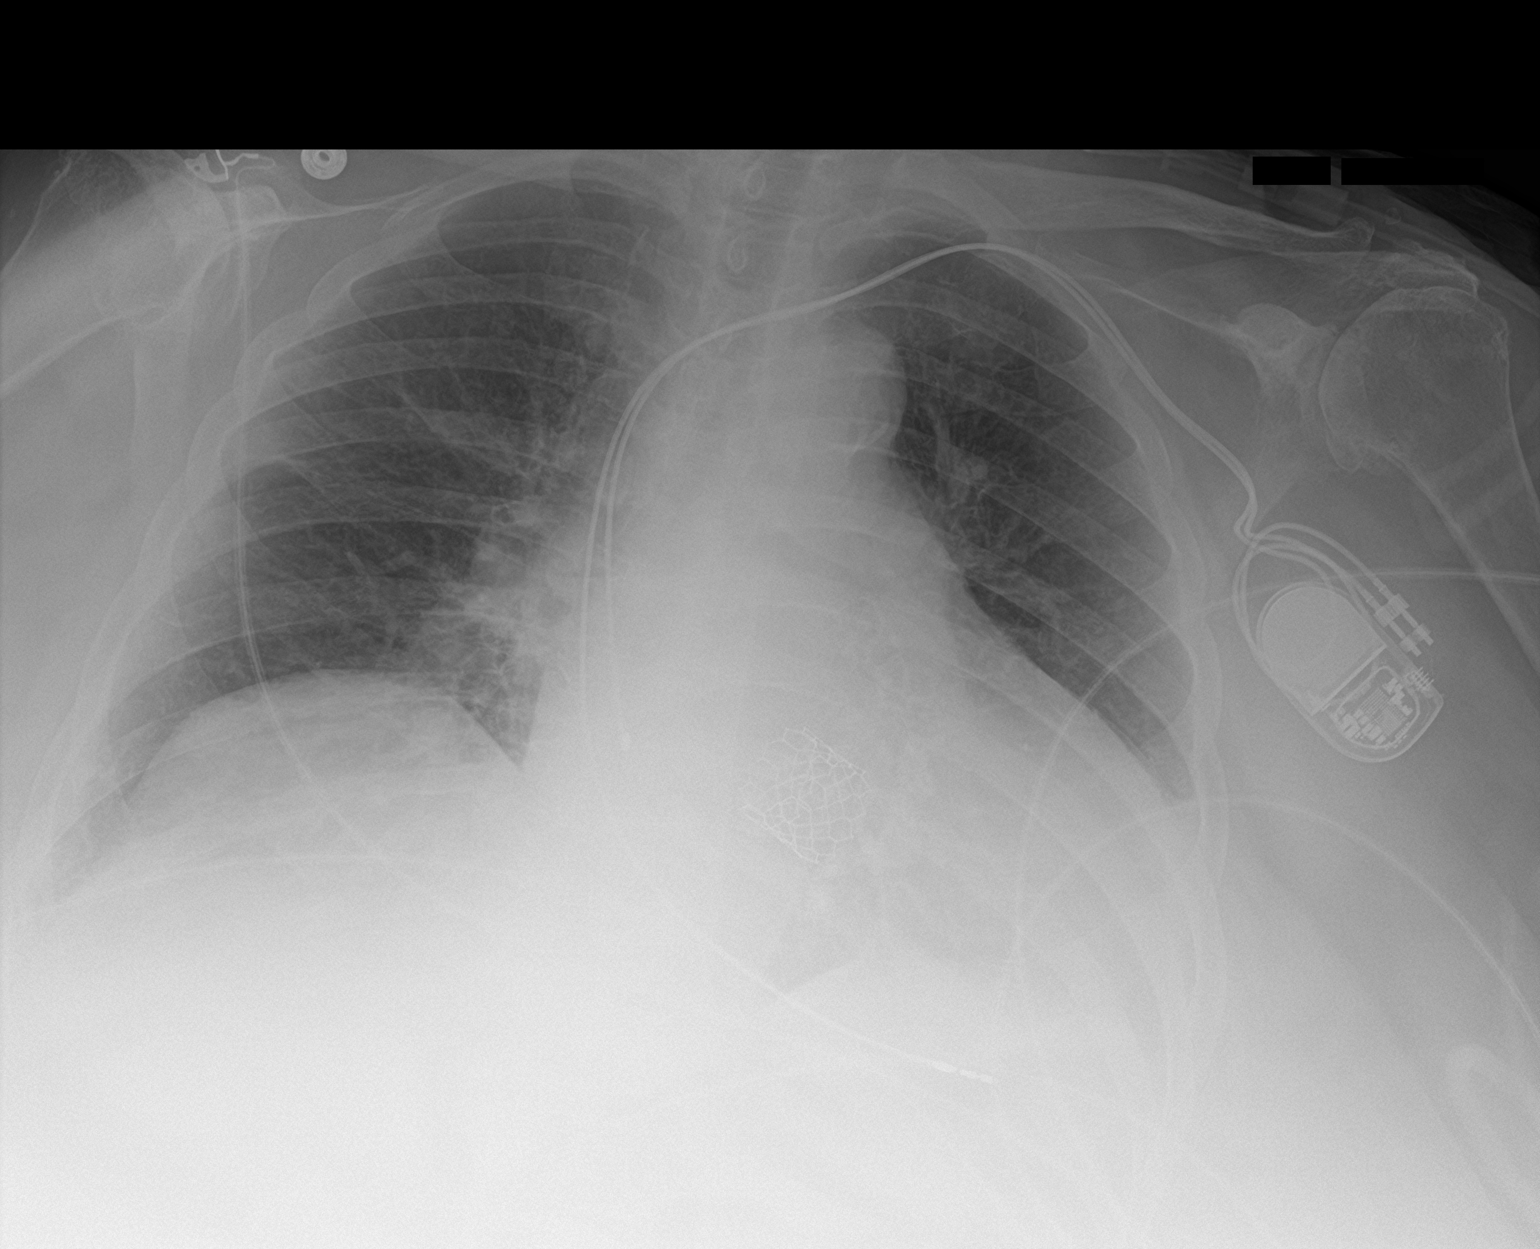

[chest lat]
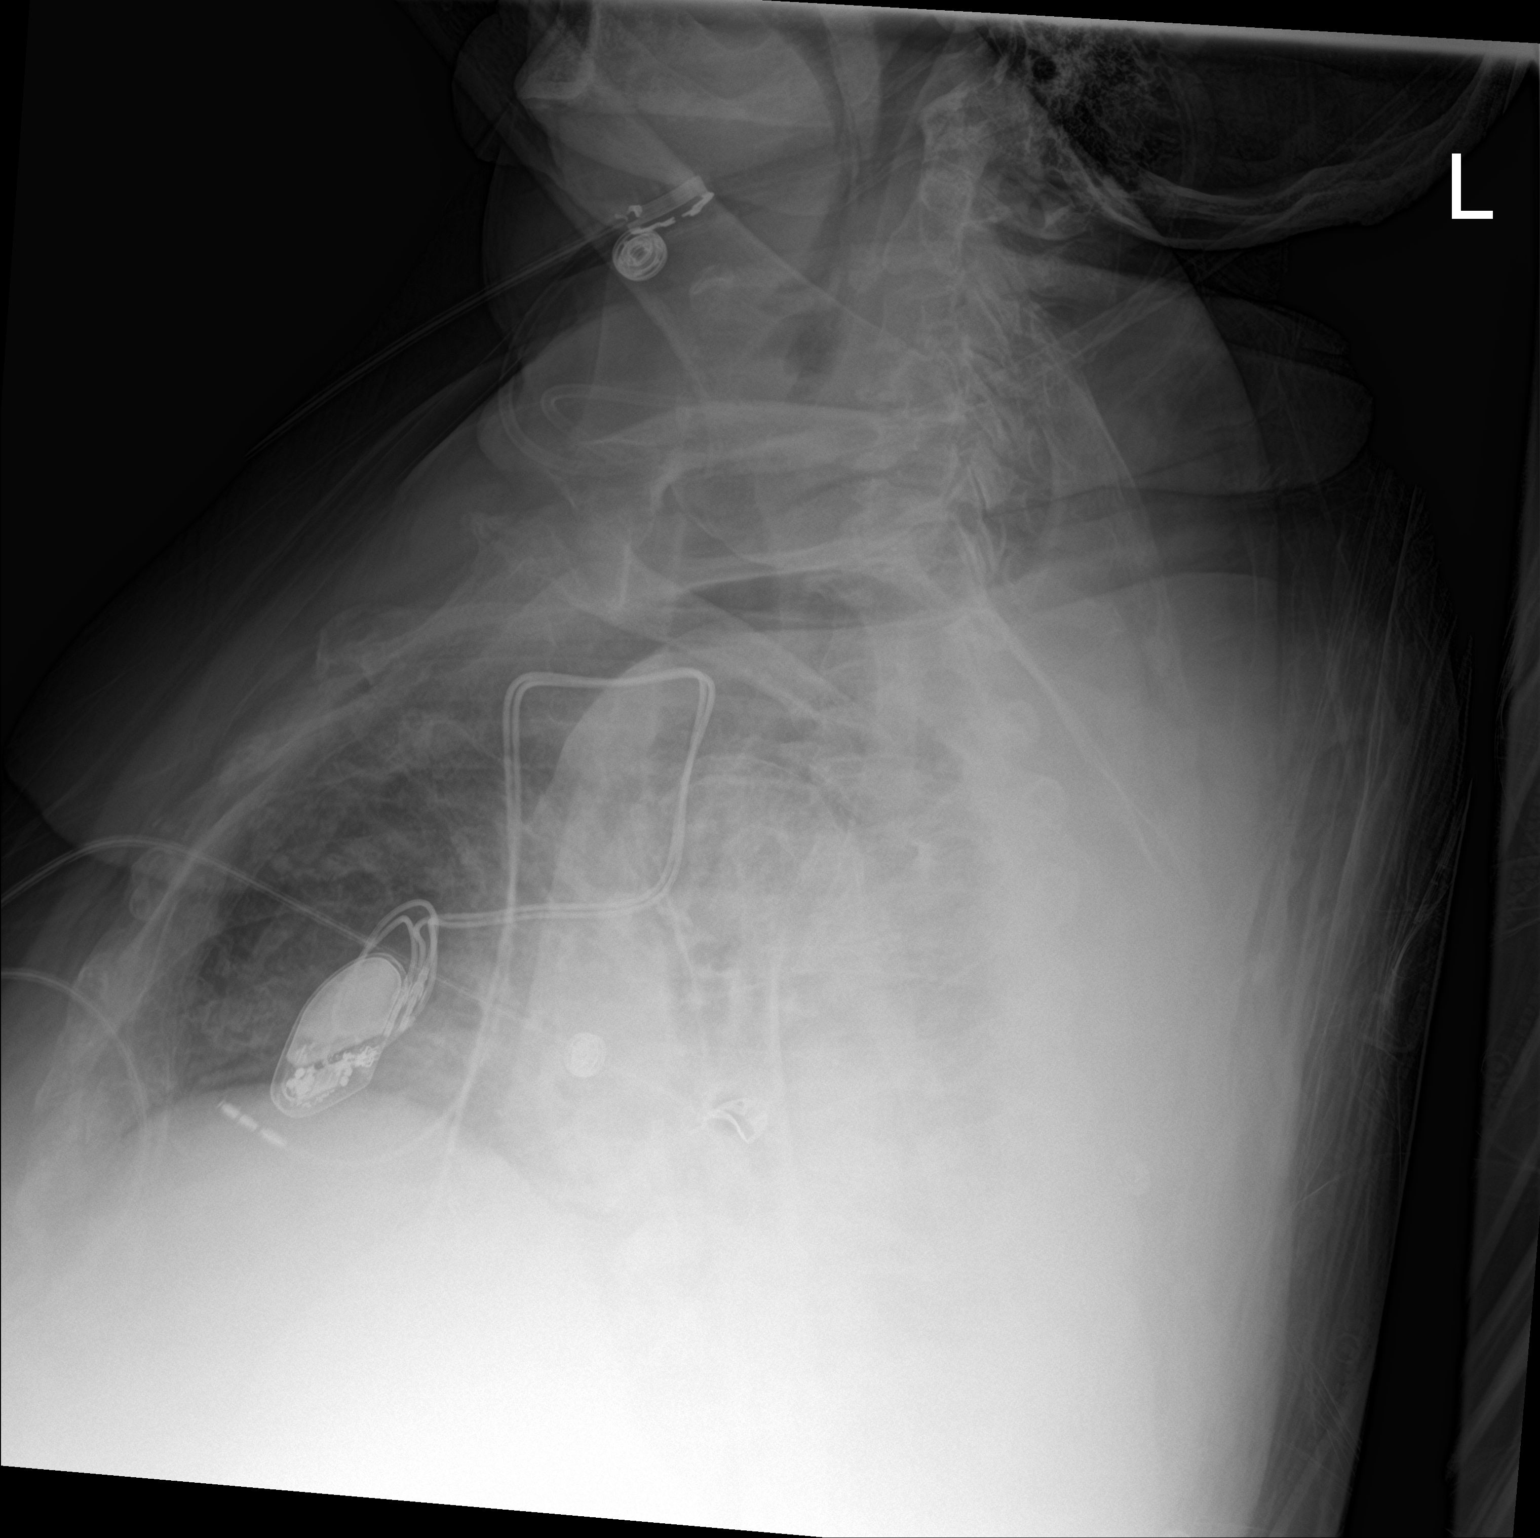

[2 of 2 positions shown; findings below may reference images not displayed]

FINDINGS: Dual-chamber pacer from the left leads over the right atrial
appendage and right ventricle. Transcatheter aortic valve
replacement. Stable cardiopericardial enlargement. Stable low lung
volumes with atelectasis. No pneumothorax.

Severely limited lateral view due to difficulty with positioning.
IMPRESSION: No complicating features after dual-chamber pacer implant

## 2020-04-12 ENCOUNTER — Ambulatory Visit (INDEPENDENT_AMBULATORY_CARE_PROVIDER_SITE_OTHER): Payer: Medicare PPO

## 2020-04-12 DIAGNOSIS — I442 Atrioventricular block, complete: Secondary | ICD-10-CM

## 2020-04-13 LAB — CUP PACEART REMOTE DEVICE CHECK
Date Time Interrogation Session: 20211214091628
Implantable Lead Implant Date: 20200311
Implantable Lead Implant Date: 20200311
Implantable Lead Location: 753859
Implantable Lead Location: 753860
Implantable Lead Model: 377
Implantable Lead Model: 377
Implantable Lead Serial Number: 81024123
Implantable Lead Serial Number: 81075341
Implantable Pulse Generator Implant Date: 20200311
Pulse Gen Model: 407145
Pulse Gen Serial Number: 69550148

## 2020-04-26 NOTE — Progress Notes (Signed)
Remote pacemaker transmission.   

## 2020-06-28 DIAGNOSIS — I350 Nonrheumatic aortic (valve) stenosis: Secondary | ICD-10-CM | POA: Diagnosis not present

## 2020-06-28 DIAGNOSIS — F331 Major depressive disorder, recurrent, moderate: Secondary | ICD-10-CM | POA: Diagnosis not present

## 2020-06-28 DIAGNOSIS — E782 Mixed hyperlipidemia: Secondary | ICD-10-CM | POA: Diagnosis not present

## 2020-06-28 DIAGNOSIS — G629 Polyneuropathy, unspecified: Secondary | ICD-10-CM | POA: Diagnosis not present

## 2020-07-12 ENCOUNTER — Ambulatory Visit (INDEPENDENT_AMBULATORY_CARE_PROVIDER_SITE_OTHER): Payer: Medicare PPO

## 2020-07-12 DIAGNOSIS — I442 Atrioventricular block, complete: Secondary | ICD-10-CM | POA: Diagnosis not present

## 2020-07-12 LAB — CUP PACEART REMOTE DEVICE CHECK
Date Time Interrogation Session: 20220314153242
Implantable Lead Implant Date: 20200311
Implantable Lead Implant Date: 20200311
Implantable Lead Location: 753859
Implantable Lead Location: 753860
Implantable Lead Model: 377
Implantable Lead Model: 377
Implantable Lead Serial Number: 81024123
Implantable Lead Serial Number: 81075341
Implantable Pulse Generator Implant Date: 20200311
Pulse Gen Model: 407145
Pulse Gen Serial Number: 69550148

## 2020-07-19 NOTE — Progress Notes (Signed)
Remote pacemaker transmission.   

## 2020-07-27 DIAGNOSIS — Z23 Encounter for immunization: Secondary | ICD-10-CM | POA: Diagnosis not present

## 2020-07-27 DIAGNOSIS — G4730 Sleep apnea, unspecified: Secondary | ICD-10-CM | POA: Diagnosis not present

## 2020-07-27 DIAGNOSIS — G629 Polyneuropathy, unspecified: Secondary | ICD-10-CM | POA: Diagnosis not present

## 2020-07-27 DIAGNOSIS — Z6841 Body Mass Index (BMI) 40.0 and over, adult: Secondary | ICD-10-CM | POA: Diagnosis not present

## 2020-07-27 DIAGNOSIS — I350 Nonrheumatic aortic (valve) stenosis: Secondary | ICD-10-CM | POA: Diagnosis not present

## 2020-07-27 DIAGNOSIS — F331 Major depressive disorder, recurrent, moderate: Secondary | ICD-10-CM | POA: Diagnosis not present

## 2020-07-27 DIAGNOSIS — I712 Thoracic aortic aneurysm, without rupture: Secondary | ICD-10-CM | POA: Diagnosis not present

## 2020-07-27 DIAGNOSIS — K8000 Calculus of gallbladder with acute cholecystitis without obstruction: Secondary | ICD-10-CM | POA: Diagnosis not present

## 2020-07-27 DIAGNOSIS — Z0001 Encounter for general adult medical examination with abnormal findings: Secondary | ICD-10-CM | POA: Diagnosis not present

## 2020-07-27 DIAGNOSIS — E440 Moderate protein-calorie malnutrition: Secondary | ICD-10-CM | POA: Diagnosis not present

## 2020-07-27 DIAGNOSIS — Z952 Presence of prosthetic heart valve: Secondary | ICD-10-CM | POA: Diagnosis not present

## 2020-07-27 DIAGNOSIS — I700 Atherosclerosis of aorta: Secondary | ICD-10-CM | POA: Diagnosis not present

## 2020-07-27 DIAGNOSIS — E782 Mixed hyperlipidemia: Secondary | ICD-10-CM | POA: Diagnosis not present

## 2020-07-27 DIAGNOSIS — M170 Bilateral primary osteoarthritis of knee: Secondary | ICD-10-CM | POA: Diagnosis not present

## 2020-07-27 DIAGNOSIS — E559 Vitamin D deficiency, unspecified: Secondary | ICD-10-CM | POA: Diagnosis not present

## 2020-07-27 DIAGNOSIS — G9009 Other idiopathic peripheral autonomic neuropathy: Secondary | ICD-10-CM | POA: Diagnosis not present

## 2020-07-27 DIAGNOSIS — Z0000 Encounter for general adult medical examination without abnormal findings: Secondary | ICD-10-CM | POA: Diagnosis not present

## 2020-08-29 DIAGNOSIS — F329 Major depressive disorder, single episode, unspecified: Secondary | ICD-10-CM | POA: Diagnosis not present

## 2020-08-29 DIAGNOSIS — I2510 Atherosclerotic heart disease of native coronary artery without angina pectoris: Secondary | ICD-10-CM | POA: Diagnosis not present

## 2020-10-11 ENCOUNTER — Ambulatory Visit (INDEPENDENT_AMBULATORY_CARE_PROVIDER_SITE_OTHER): Payer: Medicare PPO

## 2020-10-11 DIAGNOSIS — I442 Atrioventricular block, complete: Secondary | ICD-10-CM

## 2020-10-12 LAB — CUP PACEART REMOTE DEVICE CHECK
Date Time Interrogation Session: 20220614072707
Implantable Lead Implant Date: 20200311
Implantable Lead Implant Date: 20200311
Implantable Lead Location: 753859
Implantable Lead Location: 753860
Implantable Lead Model: 377
Implantable Lead Model: 377
Implantable Lead Serial Number: 81024123
Implantable Lead Serial Number: 81075341
Implantable Pulse Generator Implant Date: 20200311
Pulse Gen Model: 407145
Pulse Gen Serial Number: 69550148

## 2020-10-28 DIAGNOSIS — I2510 Atherosclerotic heart disease of native coronary artery without angina pectoris: Secondary | ICD-10-CM | POA: Diagnosis not present

## 2020-10-28 DIAGNOSIS — F329 Major depressive disorder, single episode, unspecified: Secondary | ICD-10-CM | POA: Diagnosis not present

## 2020-10-30 NOTE — Progress Notes (Signed)
Remote pacemaker transmission.   

## 2020-11-28 DIAGNOSIS — I2510 Atherosclerotic heart disease of native coronary artery without angina pectoris: Secondary | ICD-10-CM | POA: Diagnosis not present

## 2020-11-28 DIAGNOSIS — F329 Major depressive disorder, single episode, unspecified: Secondary | ICD-10-CM | POA: Diagnosis not present

## 2020-12-10 DIAGNOSIS — I10 Essential (primary) hypertension: Secondary | ICD-10-CM | POA: Insufficient documentation

## 2021-01-10 ENCOUNTER — Ambulatory Visit (INDEPENDENT_AMBULATORY_CARE_PROVIDER_SITE_OTHER): Payer: Medicare PPO

## 2021-01-10 DIAGNOSIS — I442 Atrioventricular block, complete: Secondary | ICD-10-CM | POA: Diagnosis not present

## 2021-01-11 LAB — CUP PACEART REMOTE DEVICE CHECK
Date Time Interrogation Session: 20220912105632
Implantable Lead Implant Date: 20200311
Implantable Lead Implant Date: 20200311
Implantable Lead Location: 753859
Implantable Lead Location: 753860
Implantable Lead Model: 377
Implantable Lead Model: 377
Implantable Lead Serial Number: 81024123
Implantable Lead Serial Number: 81075341
Implantable Pulse Generator Implant Date: 20200311
Pulse Gen Model: 407145
Pulse Gen Serial Number: 69550148

## 2021-01-17 NOTE — Progress Notes (Signed)
Remote pacemaker transmission.   

## 2021-03-22 IMAGING — CT CT CHEST W/O CM
2 of 4 series · 13 of 36 positions shown, 16 images · non-contrast
Comparison: Abdominopelvic CT 08/16/2019 and 08/12/2019. Chest CTA
05/30/2018.

CLINICAL DATA: Weakness with worsening low back pain. Awaiting
gallbladder surgery for cholecystitis. Shortness of breath.

EXAM:
CT CHEST, ABDOMEN AND PELVIS WITHOUT CONTRAST
TECHNIQUE: Multidetector CT imaging of the chest, abdomen and pelvis was
performed following the standard protocol without IV contrast.

[Series 3: lungs · axial · 0.89mm/px · z∈[-712,-168]mm · 10 of 305 slices shown, 13 images]
[im 17/305  mediastinal]
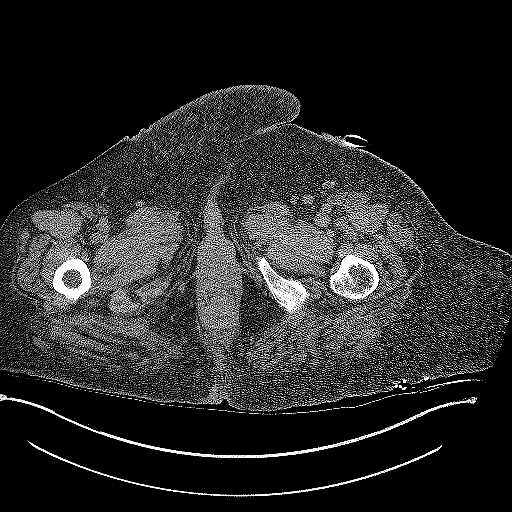
[im 17/305  lung]
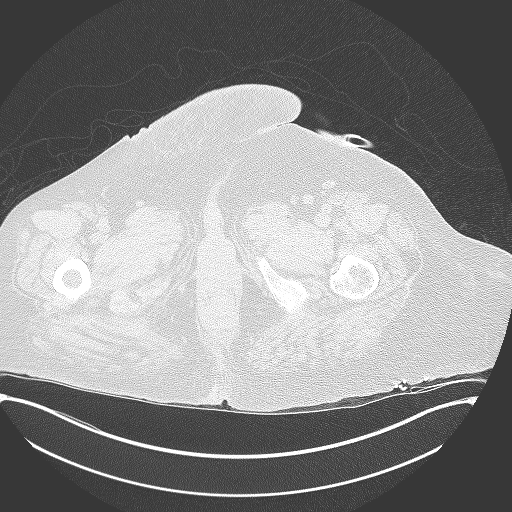
[im 49/305  lung]
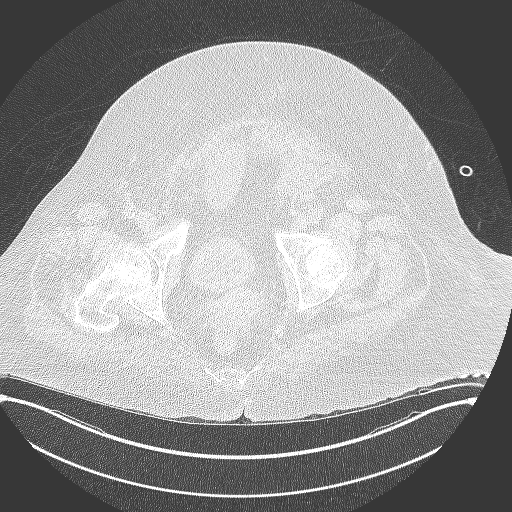
[im 81/305  lung]
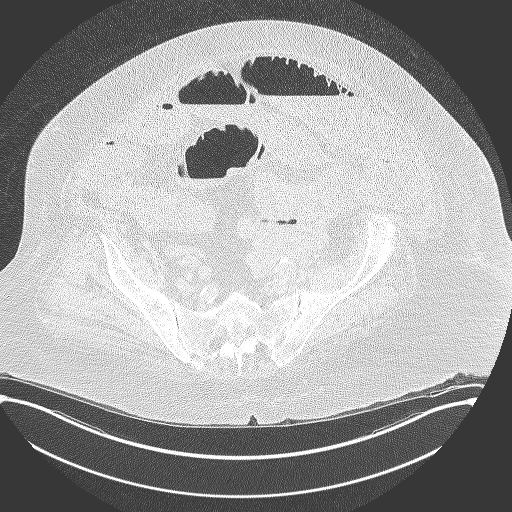
[im 113/305  lung]
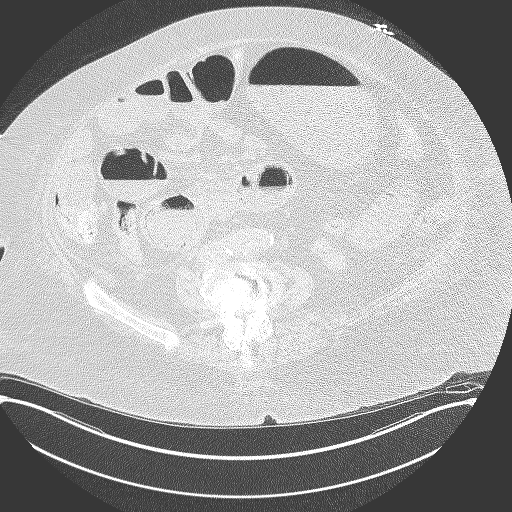
[im 145/305  mediastinal]
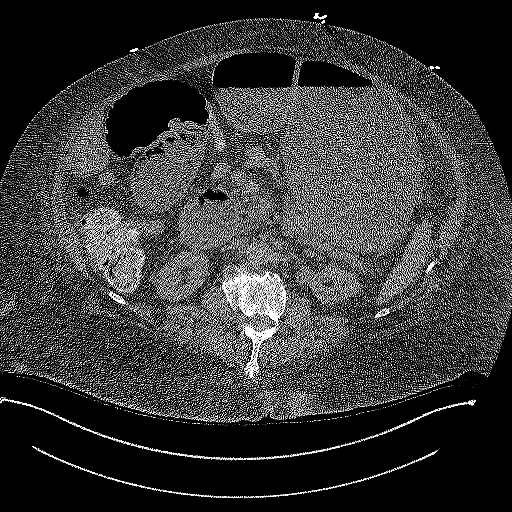
[im 145/305  lung]
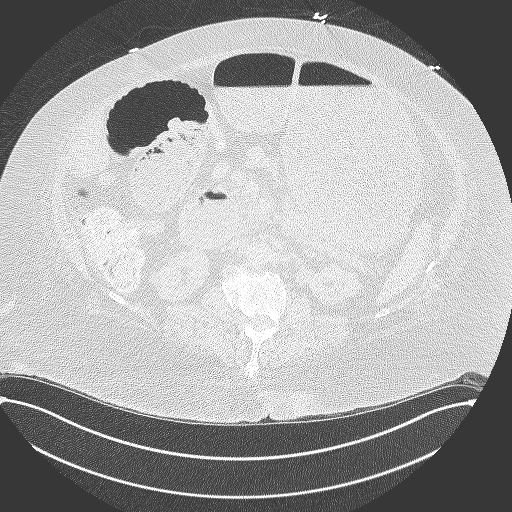
[im 161/305  lung]
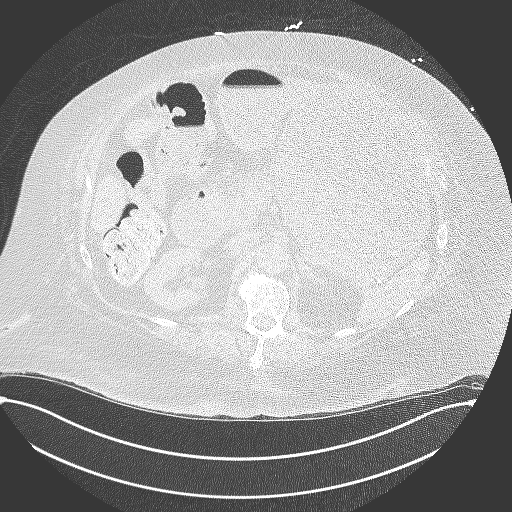
[im 193/305  lung]
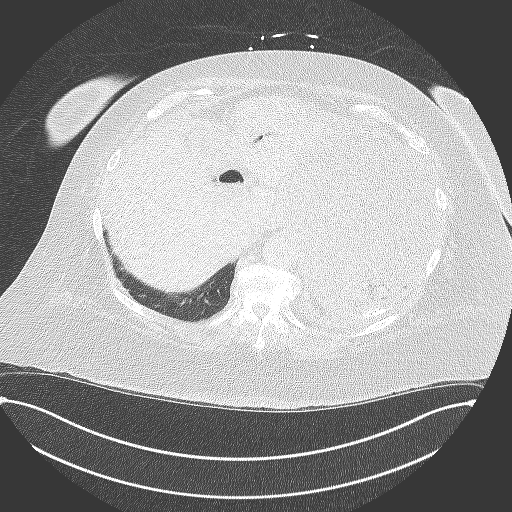
[im 225/305  lung]
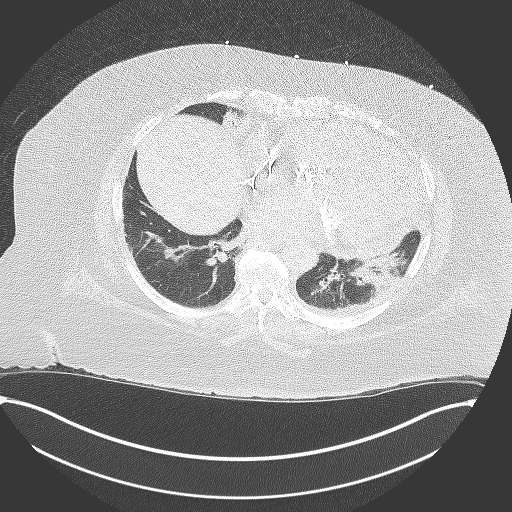
[im 257/305  mediastinal]
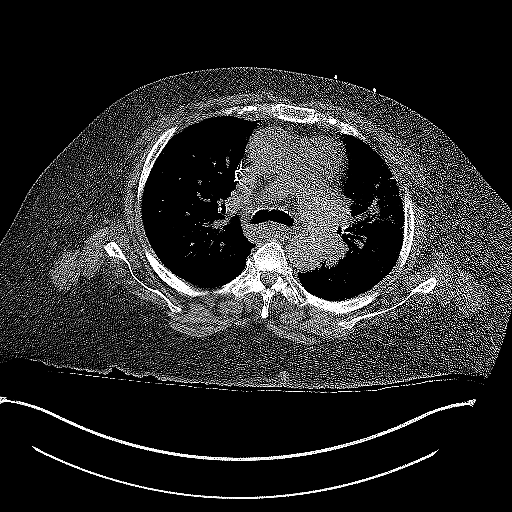
[im 257/305  lung]
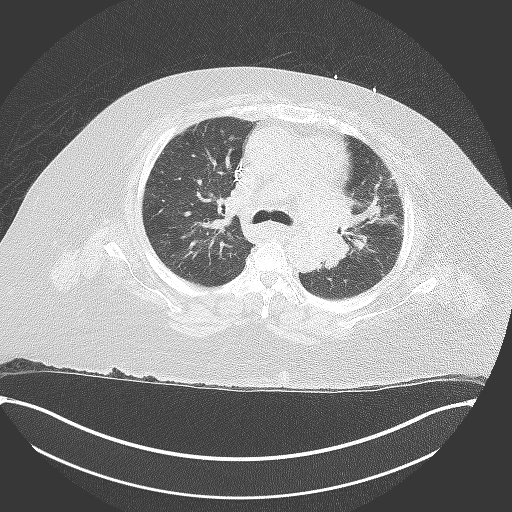
[im 289/305  lung]
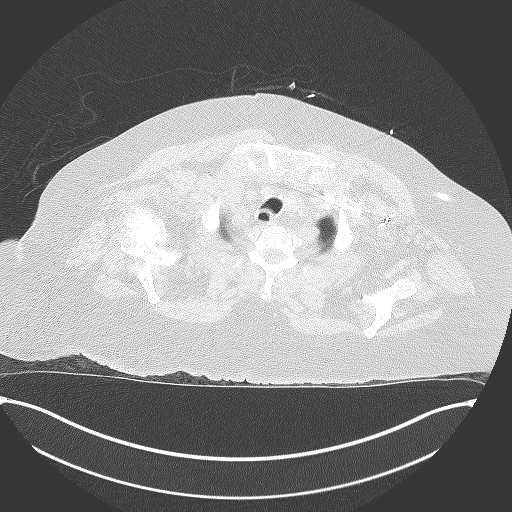

[Series 5: coronal · coronal · 0.85mm/px · 3 of 196 slices shown]
[im 40/196  lung]
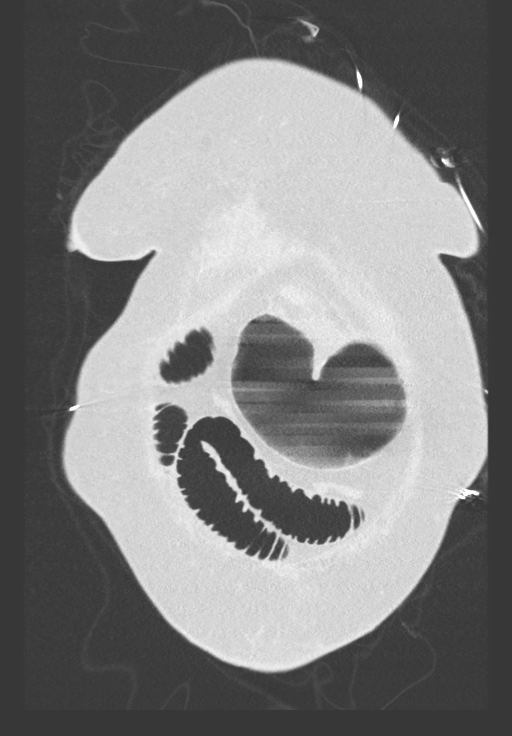
[im 79/196  lung]
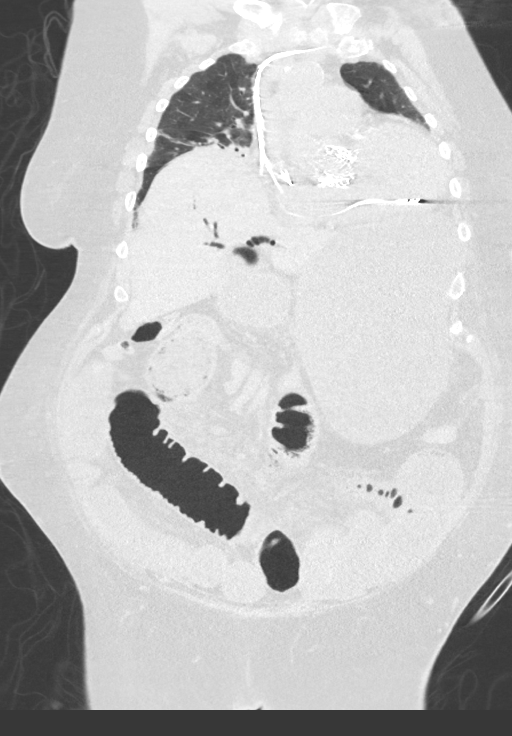
[im 118/196  lung]
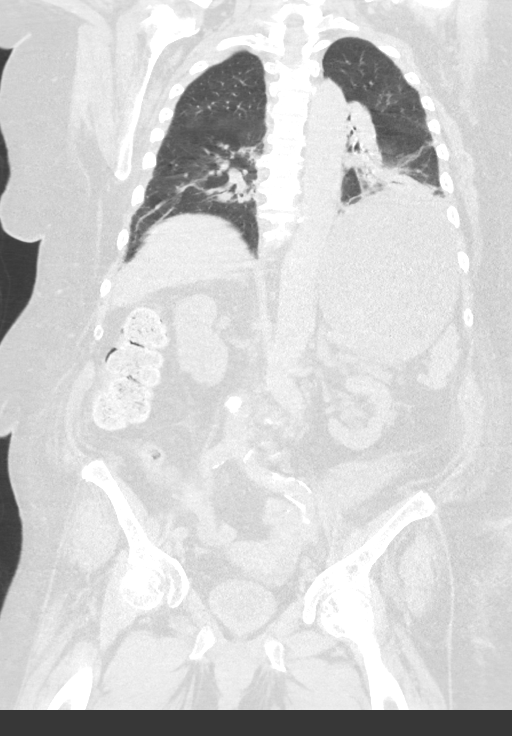

[13 of 36 positions shown; findings below may reference images not displayed]

FINDINGS: CT CHEST FINDINGS

Cardiovascular: Mild diffuse atherosclerosis of the aorta, great
vessels and coronary arteries. No acute vascular findings on
noncontrast imaging. Left subclavian pacemaker leads extend into the
right atrium and right ventricle. The heart is enlarged post TAVR.
Mitral annular calcifications are noted. No significant pericardial
fluid.

Mediastinum/Nodes: There are no enlarged mediastinal, hilar or
axillary lymph nodes. The thyroid gland, trachea and esophagus
demonstrate no significant findings.

Lungs/Pleura: Trace dependent left pleural effusion. No
pneumothorax. Atelectasis at both lung bases has mildly progressed
from the recent abdominal CT. No confluent airspace opacity or
significant pulmonary nodularity.

Musculoskeletal/Chest wall: No chest wall mass or acute osseous
findings. Multilevel thoracic spondylosis. Advanced glenohumeral
arthropathy bilaterally.

CT ABDOMEN AND PELVIS FINDINGS

Hepatobiliary: The liver appears stable as imaged in the noncontrast
state. Again demonstrated is diffuse pneumobilia with gas in the
gallbladder lumen. There is mild gallbladder wall thickening and
surrounding inflammatory change. Previously suspected communication
between the gallbladder and duodenum (cholecystoduodenal fistula) is
not clearly seen currently.

Pancreas: Atrophy. No pancreatic ductal dilatation or focal
surrounding inflammatory change.

Spleen: Normal in size without focal abnormality.

Adrenals/Urinary Tract: Stable mild thickening of the adrenal glands
without suspicious findings. Mild bilateral renal cortical thinning
with stable exophytic renal lesions bilaterally. Of note, suspected
solid lesion in the lower interpolar region of the right kidney is
not well characterized on the current study due to lack of contrast.
No hydronephrosis. The bladder appears unremarkable for its degree
of distention.

Stomach/Bowel: The stomach is now markedly distended with fluid. In
addition, there is at least moderate proximal to mid small bowel
dilatation with scattered air-fluid levels consistent with a distal
small bowel obstruction. There appear to be a few loops of distal
decompressed small bowel, although the specific transition point is
difficult to identify. Based on the prior study and clinical
likelihood of gallstone ileus, there may be an obstructing gallstone
in the distal small bowel (axial image 65/2 and coronal image 74/5),
although this is not definitive. The colon is decompressed.

Vascular/Lymphatic: There are no enlarged abdominal or pelvic lymph
nodes. Aortic and branch vessel atherosclerosis.

Reproductive: The uterus and ovaries appear normal. No adnexal mass.

Other: Mild central mesenteric edema without focal extraluminal
fluid collection. No free intraperitoneal air. No evidence of
abdominal wall hernia.

Musculoskeletal: No acute or significant osseous findings.
Multilevel lumbar spondylosis. Mild edema throughout the
subcutaneous fat.
IMPRESSION: 1. Findings are consistent with a high-grade distal small bowel
obstruction, likely from an obstructing gallstone in the distal
small bowel (gallstone ileus).
2. The stomach is now markedly distended with fluid, and the patient
may benefit from nasogastric tube decompression. No evidence of
bowel perforation or abscess.
3. Stable pneumobilia with gas in the gallbladder lumen, presumably
from a cholecystoduodenal fistula based on prior studies.
4. Mildly progressed bibasilar atelectasis. Trace dependent left
pleural effusion.
5. Aortic Atherosclerosis ([id number]).

## 2021-03-23 IMAGING — US US RENAL
1 series · 14 of 25 positions shown · non-contrast
Comparison: None.

CLINICAL DATA: Acute kidney injury.

EXAM:
RENAL / URINARY TRACT ULTRASOUND COMPLETE

[Series 1: us renal · 14 of 26 slices shown]
[im 1/26]
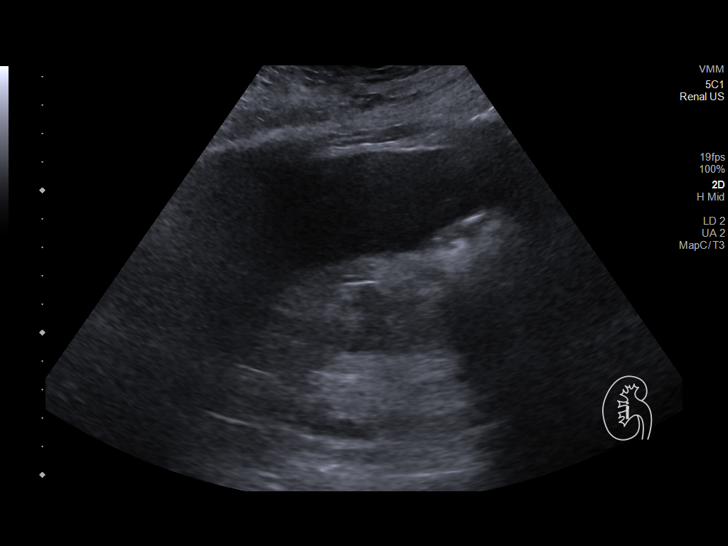
[im 3/26]
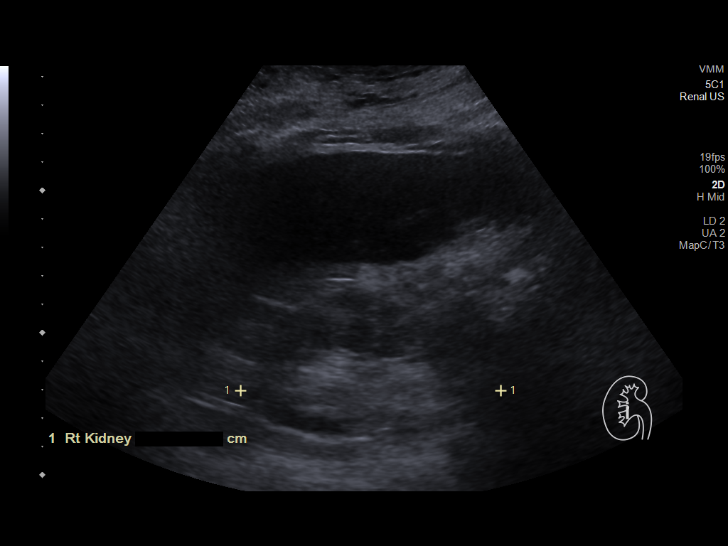
[im 5/26]
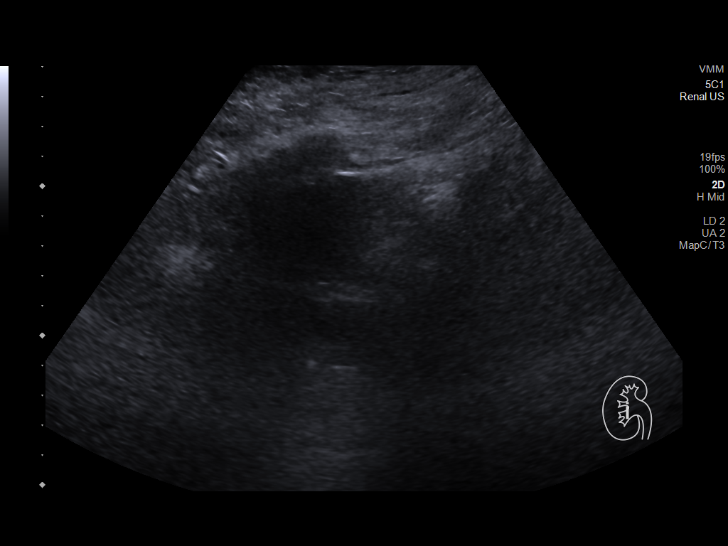
[im 7/26]
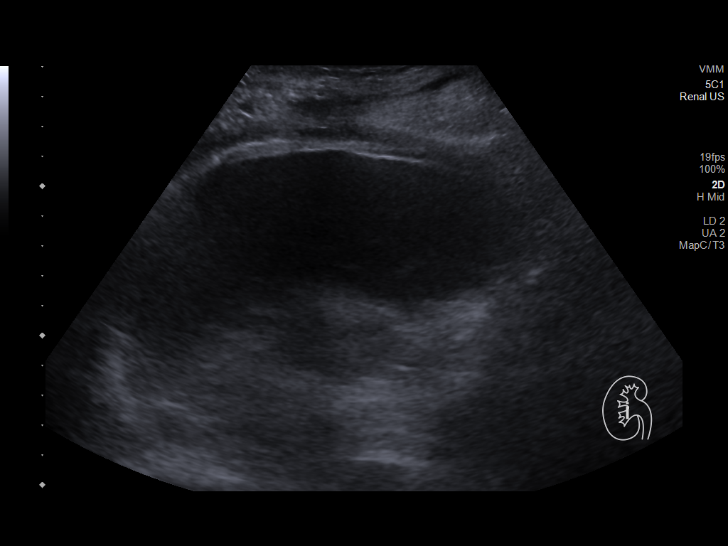
[im 9/26]
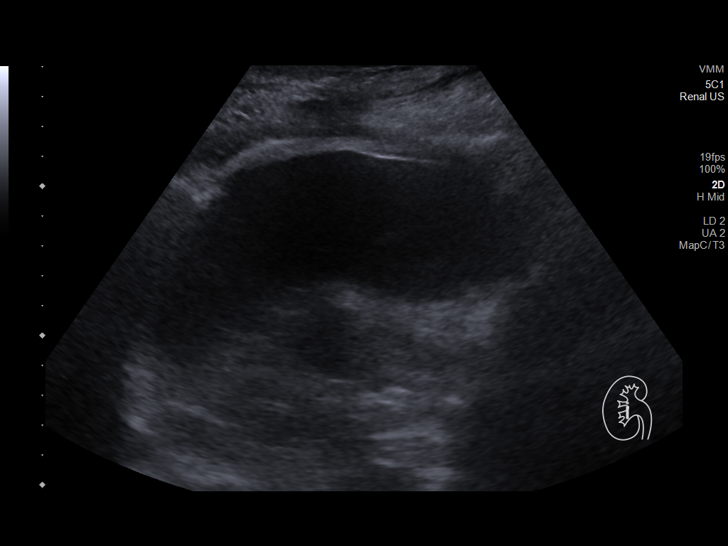
[im 10/26]
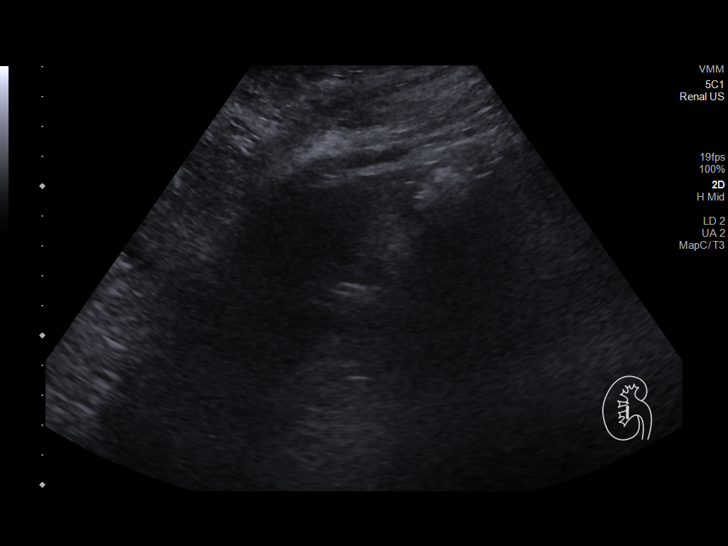
[im 12/26]
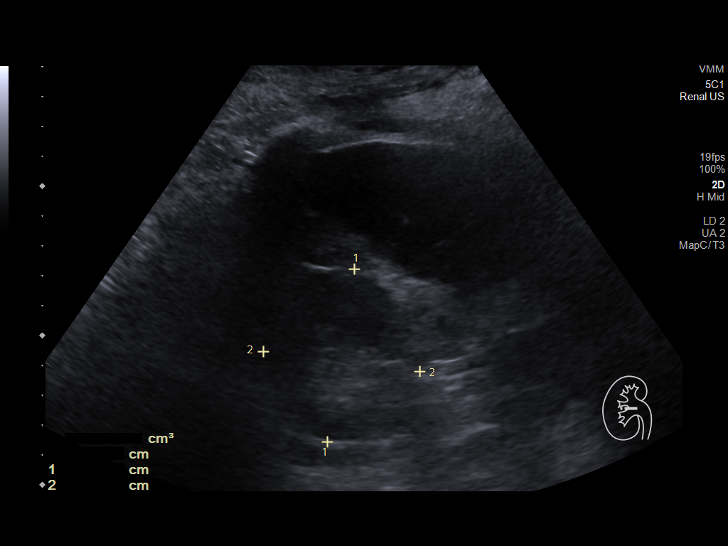
[im 14/26]
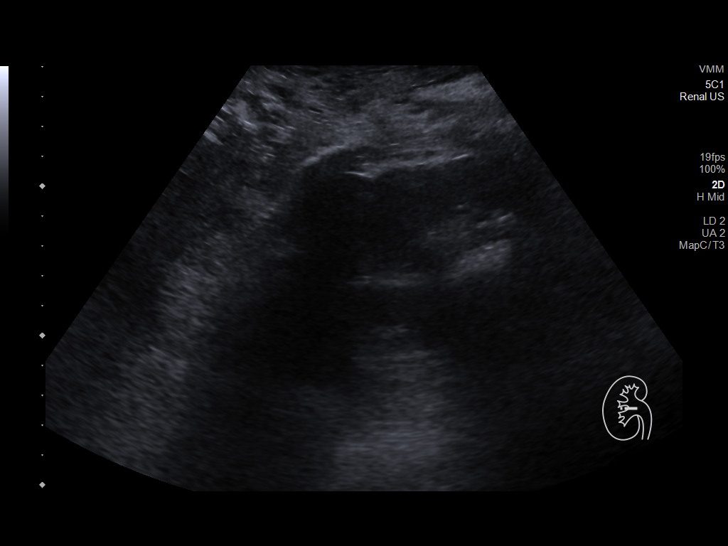
[im 16/26]
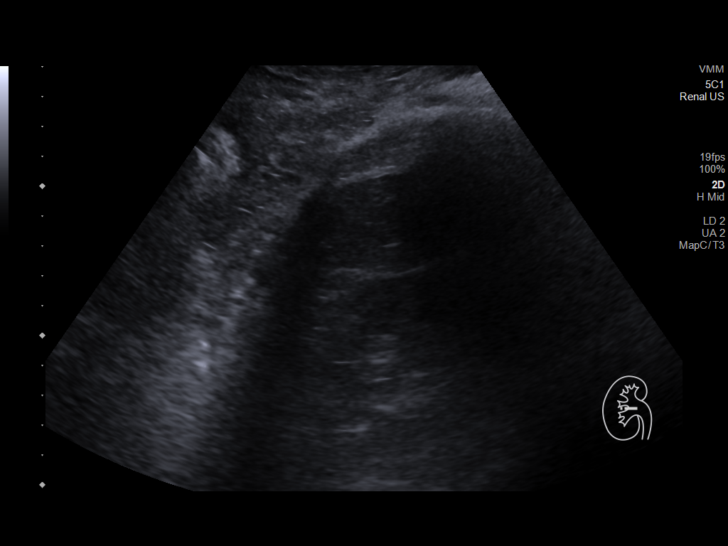
[im 17/26]
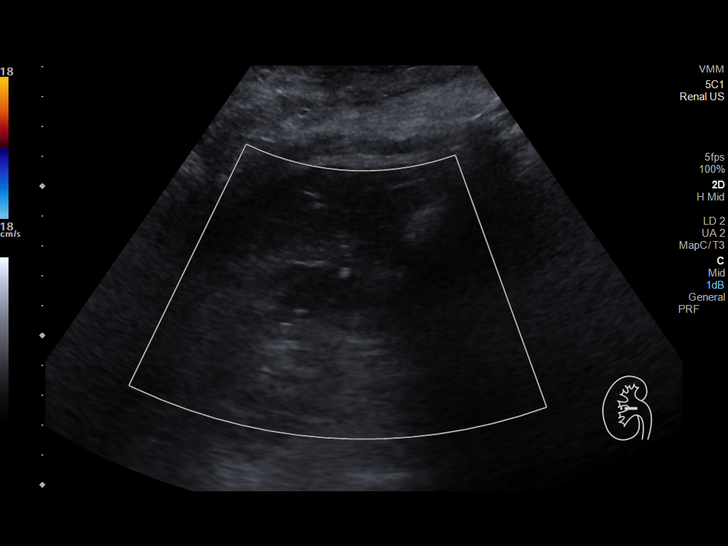
[im 19/26]
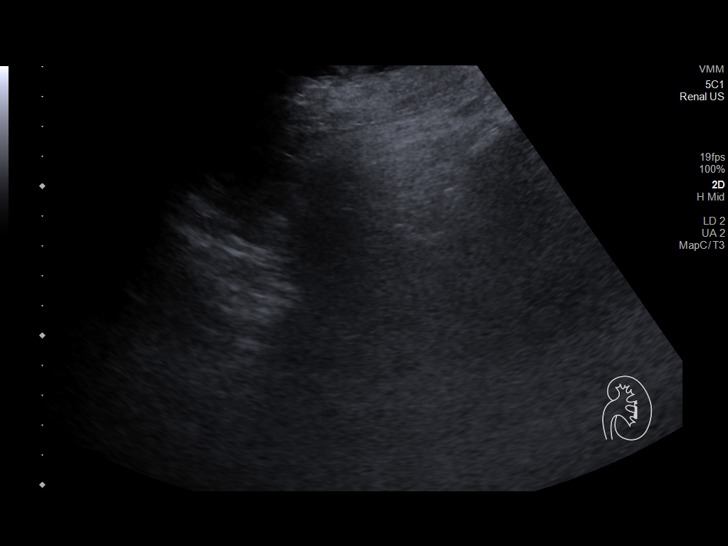
[im 21/26]
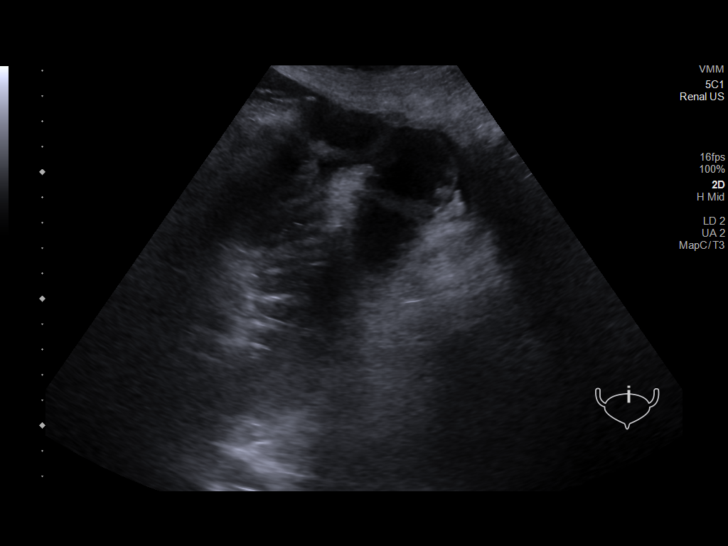
[im 23/26]
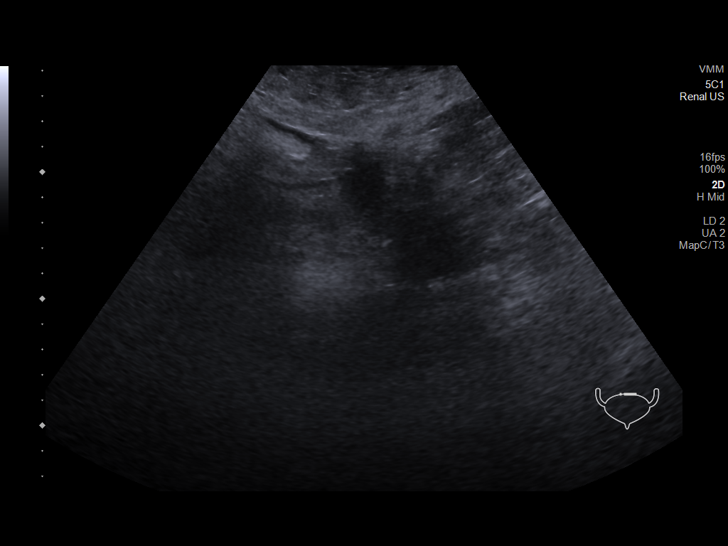
[im 26/26]
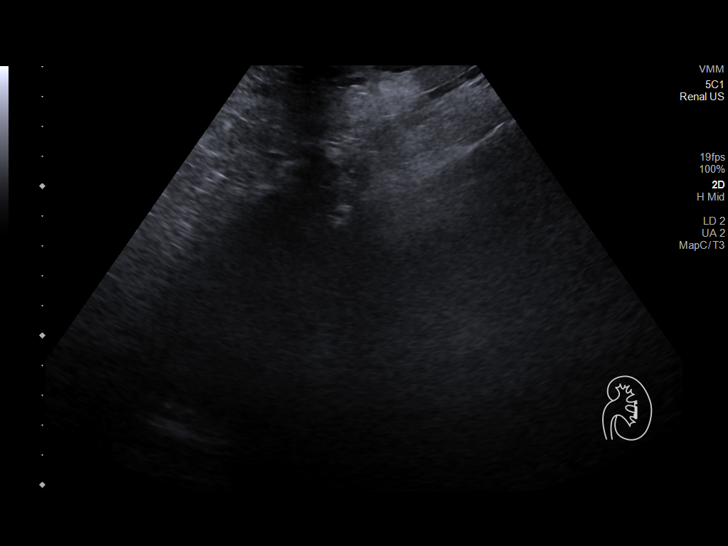

[14 of 25 positions shown; findings below may reference images not displayed]

FINDINGS: Right Kidney:

Renal measurements: 9.1 x 5.8 x 5.2 cm = volume: 144 mL .
Echogenicity within normal limits. No mass or hydronephrosis
visualized.

Left Kidney:

Not visualized.

Bladder:

Not seen, presumably decompressed.

Other:

None.
IMPRESSION: 1. Visualized portion of the RIGHT kidney appears normal. Earlier CT
abdomen of 08/16/2019 described a solid/cystic appearing lesion
arising from the lower pole of the RIGHT kidney. This mass is not
able to be visualized by ultrasound, presumably obscured by
overlying bowel gas. Consider MRI for definitive characterization.
When the patient is clinically stable and able to follow directions
and hold their breath (preferably as an outpatient) further
evaluation with dedicated abdominal MRI should be considered.
2. LEFT kidney is not able to be visualized by ultrasound,
presumably obscured by overlying bowel gas.
3. No acute appearing renal findings.  No hydronephrosis.
4. Bladder is not seen, presumably decompressed.

## 2021-04-11 ENCOUNTER — Ambulatory Visit (INDEPENDENT_AMBULATORY_CARE_PROVIDER_SITE_OTHER): Payer: Medicare PPO

## 2021-04-11 DIAGNOSIS — I442 Atrioventricular block, complete: Secondary | ICD-10-CM

## 2021-04-12 LAB — CUP PACEART REMOTE DEVICE CHECK
Date Time Interrogation Session: 20221212103216
Implantable Lead Implant Date: 20200311
Implantable Lead Implant Date: 20200311
Implantable Lead Location: 753859
Implantable Lead Location: 753860
Implantable Lead Model: 377
Implantable Lead Model: 377
Implantable Lead Serial Number: 81024123
Implantable Lead Serial Number: 81075341
Implantable Pulse Generator Implant Date: 20200311
Pulse Gen Model: 407145
Pulse Gen Serial Number: 69550148

## 2021-04-19 NOTE — Progress Notes (Signed)
Remote pacemaker transmission.   

## 2021-04-20 DIAGNOSIS — G629 Polyneuropathy, unspecified: Secondary | ICD-10-CM | POA: Insufficient documentation

## 2021-04-20 DIAGNOSIS — E782 Mixed hyperlipidemia: Secondary | ICD-10-CM | POA: Diagnosis not present

## 2021-04-20 DIAGNOSIS — Z23 Encounter for immunization: Secondary | ICD-10-CM | POA: Diagnosis not present

## 2021-04-20 DIAGNOSIS — Z950 Presence of cardiac pacemaker: Secondary | ICD-10-CM | POA: Diagnosis not present

## 2021-04-20 DIAGNOSIS — G4730 Sleep apnea, unspecified: Secondary | ICD-10-CM | POA: Diagnosis present

## 2021-04-20 DIAGNOSIS — G609 Hereditary and idiopathic neuropathy, unspecified: Secondary | ICD-10-CM | POA: Diagnosis not present

## 2021-04-20 DIAGNOSIS — I110 Hypertensive heart disease with heart failure: Secondary | ICD-10-CM | POA: Diagnosis not present

## 2021-04-20 DIAGNOSIS — E559 Vitamin D deficiency, unspecified: Secondary | ICD-10-CM | POA: Insufficient documentation

## 2021-04-20 DIAGNOSIS — G4733 Obstructive sleep apnea (adult) (pediatric): Secondary | ICD-10-CM | POA: Diagnosis present

## 2021-04-20 DIAGNOSIS — Z6841 Body Mass Index (BMI) 40.0 and over, adult: Secondary | ICD-10-CM | POA: Insufficient documentation

## 2021-04-20 DIAGNOSIS — E611 Iron deficiency: Secondary | ICD-10-CM | POA: Insufficient documentation

## 2021-04-20 DIAGNOSIS — F329 Major depressive disorder, single episode, unspecified: Secondary | ICD-10-CM | POA: Insufficient documentation

## 2021-04-20 DIAGNOSIS — E669 Obesity, unspecified: Secondary | ICD-10-CM

## 2021-04-20 DIAGNOSIS — L989 Disorder of the skin and subcutaneous tissue, unspecified: Secondary | ICD-10-CM | POA: Diagnosis not present

## 2021-05-10 NOTE — Progress Notes (Deleted)
Cardiology Office Note  Date: 05/10/2021   ID: Dawn AtlasSandra F Senteno, DOB 10-Mar-1941, MRN [id number]  PCP:  Benita StabileHall, John Z, MD  Cardiologist:  Nona DellSamuel Mehki Klumpp, MD Electrophysiologist:  Lewayne BuntingGregg Taylor, MD   No chief complaint on file.   History of Present Illness: Dawn Gonzales is an 81 y.o. female last assessed via telehealth encounter in June 2021.  She has a Biotronik pacemaker in place with followed by Dr. Ladona Ridgelaylor.  Device check in December 2022 revealed normal function.  Last echocardiogram was in March 2021 at which point LVEF was 60 to 65% and TAVR prosthesis was stable with mild to moderate paravalvular regurgitation and increased mean gradient of 19 mmHg.  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   26 mm Edwards Sapien 3 transcatheter heart valve placed via percutaneous right transfemoral approach    Severe aortic stenosis    Sleep apnea     Past Surgical History:  Procedure Laterality Date   Abdominal gangrene     ADENOIDECTOMY     Cataract surgery     COLONOSCOPY     ENDOSCOPIC RETROGRADE CHOLANGIOPANCREATOGRAPHY (ERCP) WITH PROPOFOL N/A 02/08/2018   Procedure: ENDOSCOPIC RETROGRADE CHOLANGIOPANCREATOGRAPHY (ERCP) WITH PROPOFOL;  Surgeon: Lemar LoftyMansouraty, Gabriel Jr., MD;  [facility];  Service: Gastroenterology;  Laterality: N/A;   EUS  02/08/2018   Procedure: UPPER ENDOSCOPIC ULTRASOUND (EUS) LINEAR;  Surgeon: Meridee ScoreMansouraty, Netty StarringGabriel Jr., MD;  [facility];  Service: Gastroenterology;;   EYE SURGERY     IR FLUORO RM 30-60 MIN  03/27/2018    IR PERC CHOLECYSTOSTOMY  02/09/2018   IR RADIOLOGIST EVAL & MGMT  03/26/2018   IR RADIOLOGY PERIPHERAL GUIDED IV START  05/22/2018   IR RADIOLOGY PERIPHERAL GUIDED IV START  05/30/2018   IR RADIOLOGY PERIPHERAL GUIDED IV START  06/05/2018   IR US GUIDE VASC ACCESS RIGHT  05/22/2018   IR US GUIDE VASC ACCESS RIGHT  05/30/2018   IR US GUIDE VASC ACCESS RIGHT  06/05/2018   PACEMAKER IMPLANT N/A 07/10/2018   Procedure: PACEMAKER IMPLANT;  Surgeon: Marinus Mawaylor, Gregg W, MD;  [facility];  Service: Cardiovascular;  Laterality: N/A;   REMOVAL OF STONES  02/08/2018   Procedure: REMOVAL OF STONES;  Surgeon: Meridee ScoreMansouraty, Netty StarringGabriel Jr., MD;  [facility];  Service: Gastroenterology;;   Dennison MascotSPHINCTEROTOMY  02/08/2018   Procedure: Dennison MascotSPHINCTEROTOMY;  Surgeon: Mansouraty, Netty StarringGabriel Jr., MD;  [facility];  Service: Gastroenterology;;   TONSILLECTOMY     TOOTH EXTRACTION N/A 06/18/2018   Procedure: DENTAL RESTORATION/EXTRACTIONS;  Surgeon: Ocie DoyneJensen, Scott, DDS;  [facility];  Service: Oral Surgery;  Laterality: N/A;   TRANSCATHETER AORTIC VALVE REPLACEMENT, TRANSFEMORAL N/A 07/09/2018   Procedure: TRANSCATHETER AORTIC VALVE REPLACEMENT, TRANSFEMORAL;  Surgeon: Tonny Bollmanooper, Michael, MD;  [facility];  Service: Cardiovascular;  Laterality: N/A;   Tummy tuck      Current Outpatient Medications  Medication Sig Dispense Refill   acetaminophen (TYLENOL) 325 MG tablet Take 2 tablets (  650 mg total) by mouth every 6 (six) hours as needed for mild pain, fever or headache (or Fever >/= 101). 12 tablet 0   albuterol (PROVENTIL) (2.5 MG/3ML) 0.083% nebulizer solution Take 3 mLs (2.5 mg total) by nebulization every 6 (six) hours as needed for wheezing or shortness of breath. (Patient not taking: Reported on 10/29/2019) 75 mL 12   aspirin EC 81 MG tablet Take 1 tablet (81 mg total) by mouth daily with breakfast. 12 tablet 0   Calcium Carbonate (CALCIUM-CARB 600 PO) Take 600 mg by mouth daily.      cholecalciferol (VITAMIN D) 1000 units tablet Take 1,000 Units by mouth daily.     ferrous sulfate 325 (65 FE) MG tablet Take 325 mg by mouth daily with breakfast.     gabapentin (NEURONTIN) 300 MG capsule Take 300 mg by mouth 3 (three) times daily.     metoCLOPramide (REGLAN) 10 MG tablet Take 1 tablet (10 mg total) by mouth every 6 (six) hours as needed for nausea (nausea/headache). 30 tablet 0   Multiple Vitamin (MULTIVITAMIN WITH MINERALS) TABS tablet Take 1 tablet by mouth daily.     ondansetron (ZOFRAN ODT) 4 MG disintegrating tablet 4mg  ODT q4 hours prn nausea/vomit 10 tablet 0   polyethylene glycol (MIRALAX MIX-IN PAX) 17 g packet Take 17 g by mouth daily as needed for mild constipation or moderate constipation. 14 each 0   pravastatin (PRAVACHOL) 20 MG tablet Take 20 mg by mouth daily.     silver sulfADIAZINE (SILVADENE) 1 % cream Apply 1 application topically 2 (two) times daily.     traMADol (ULTRAM) 50 MG tablet Take 1 tablet (50 mg total) by mouth every 6 (six) hours as needed for moderate pain. 10 tablet 0   venlafaxine (EFFEXOR) 37.5 MG tablet Take 37.5 mg by mouth daily.     vitamin C (ASCORBIC ACID) 500 MG tablet Take 500 mg by mouth daily.     No current facility-administered medications for this visit.   Allergies:  Patient has no known allergies.   Social History: The patient  reports that she quit smoking about 42 years ago. Her smoking use included cigarettes. She has never used smokeless tobacco. She reports that she does not currently use alcohol. She reports that she does not use drugs.   Family History: The patient's family history includes Arthritis in her father; Cancer in her mother; Heart attack in her father; Hypertension in her father.   ROS:  Please see the history of present illness. Otherwise, complete review of systems is positive for {NONE DEFAULTED:18576}.  All other systems are reviewed and negative.   Physical Exam: VS:  There were no vitals taken  for this visit., BMI There is no height or weight on file to calculate BMI.  Wt Readings from Last 3 Encounters:  08/27/19 272 lb 11.3 oz (123.7 kg)  08/15/19 268 lb (121.6 kg)  07/24/19 282 lb 3 oz (128 kg)    General: Patient appears comfortable at rest. HEENT: Conjunctiva and lids normal, oropharynx clear with moist mucosa. Neck: Supple, no elevated JVP or carotid bruits, no thyromegaly. Lungs: Clear to auscultation, nonlabored breathing at rest. Cardiac: Regular rate and rhythm, no S3 or significant systolic murmur, no pericardial rub. Abdomen: Soft, nontender, no hepatomegaly, bowel sounds present, no guarding or rebound. Extremities: No pitting edema, distal pulses 2+. Skin: Warm and dry. Musculoskeletal: No kyphosis. Neuropsychiatric: Alert and oriented x3, affect grossly appropriate.  ECG:  An ECG dated 08/27/2019 was personally  reviewed today and demonstrated:  Sinus rhythm with leftward axis and right bundle branch block.  Recent Labwork:  April 2021: Potassium 3.5, BUN 31, creatinine 0.75, hemoglobin 11.0, platelets 234  Other Studies Reviewed Today:  Echocardiogram 07/06/2019:  1. Left ventricular ejection fraction, by estimation, is 60 to 65%. The  left ventricle has normal function. The left ventricle has no regional  wall motion abnormalities. There is mild left ventricular hypertrophy.  Left ventricular diastolic parameters  are indeterminate.   2. Right ventricular systolic function is normal. The right ventricular  size is normal. Tricuspid regurgitation signal is inadequate for assessing  PA pressure.   3. Left atrial size was severely dilated.   4. Right atrial size was mildly dilated.   5. The mitral valve is degenerative with mild thickening and  calcification associated with severe annular calcification. Mild mitral  valve regurgitation. Mild mitral stenosis. The mean mitral valve gradient  is 4.0 mmHg.   6. The aortic valve has been repaired/replaced.  There is a 26 mm Edwards  Sapien 3 prosthesis in position. Aortic valve regurgitation is mild to  moderate - perivalvular near septum. Stable mean gradient of 19 mmHg.   7. The inferior vena cava is dilated in size with >50% respiratory  variability, suggesting right atrial pressure of 8 mmHg.   Assessment and Plan:   Medication Adjustments/Labs and Tests Ordered: Current medicines are reviewed at length with the patient today.  Concerns regarding medicines are outlined above.   Tests Ordered: No orders of the defined types were placed in this encounter.   Medication Changes: No orders of the defined types were placed in this encounter.   Disposition:  Follow up {follow up:15908}  Signed, Jonelle Sidle, MD, [facility] 05/10/2021 2:37 PM    [facility] at [facility] [address], [place], [place] [address] Phone: [phone]; Fax: [phone]

## 2021-05-11 ENCOUNTER — Ambulatory Visit: Payer: Medicare PPO | Admitting: Cardiology

## 2021-05-11 DIAGNOSIS — Z952 Presence of prosthetic heart valve: Secondary | ICD-10-CM

## 2021-05-31 DIAGNOSIS — I10 Essential (primary) hypertension: Secondary | ICD-10-CM | POA: Diagnosis not present

## 2021-05-31 DIAGNOSIS — E782 Mixed hyperlipidemia: Secondary | ICD-10-CM | POA: Diagnosis not present

## 2021-07-11 ENCOUNTER — Ambulatory Visit (INDEPENDENT_AMBULATORY_CARE_PROVIDER_SITE_OTHER): Payer: Medicare PPO

## 2021-07-11 DIAGNOSIS — I442 Atrioventricular block, complete: Secondary | ICD-10-CM | POA: Diagnosis not present

## 2021-07-12 LAB — CUP PACEART REMOTE DEVICE CHECK
Battery Remaining Percentage: 80 %
Brady Statistic RA Percent Paced: 3 %
Brady Statistic RV Percent Paced: 0 %
Date Time Interrogation Session: 20230313110410
Implantable Lead Implant Date: 20200311
Implantable Lead Implant Date: 20200311
Implantable Lead Location: 753859
Implantable Lead Location: 753860
Implantable Lead Model: 377
Implantable Lead Model: 377
Implantable Lead Serial Number: 81024123
Implantable Lead Serial Number: 81075341
Implantable Pulse Generator Implant Date: 20200311
Lead Channel Impedance Value: 449 Ohm
Lead Channel Impedance Value: 546 Ohm
Lead Channel Pacing Threshold Amplitude: 0.6 V
Lead Channel Pacing Threshold Pulse Width: 0.4 ms
Lead Channel Sensing Intrinsic Amplitude: 1.8 mV
Lead Channel Sensing Intrinsic Amplitude: 10.5 mV
Lead Channel Setting Pacing Amplitude: 1.6 V
Lead Channel Setting Pacing Amplitude: 1.8 V
Lead Channel Setting Pacing Pulse Width: 0.4 ms
Pulse Gen Model: 407145
Pulse Gen Serial Number: 69550148

## 2021-07-20 ENCOUNTER — Other Ambulatory Visit (HOSPITAL_COMMUNITY): Payer: Self-pay | Admitting: Internal Medicine

## 2021-07-20 ENCOUNTER — Other Ambulatory Visit: Payer: Self-pay | Admitting: Internal Medicine

## 2021-07-20 ENCOUNTER — Ambulatory Visit (HOSPITAL_COMMUNITY): Admission: RE | Admit: 2021-07-20 | Payer: Medicare PPO | Source: Ambulatory Visit

## 2021-07-20 DIAGNOSIS — Z6841 Body Mass Index (BMI) 40.0 and over, adult: Secondary | ICD-10-CM | POA: Diagnosis not present

## 2021-07-20 DIAGNOSIS — Z9189 Other specified personal risk factors, not elsewhere classified: Secondary | ICD-10-CM | POA: Diagnosis not present

## 2021-07-20 DIAGNOSIS — R269 Unspecified abnormalities of gait and mobility: Secondary | ICD-10-CM | POA: Insufficient documentation

## 2021-07-20 DIAGNOSIS — I110 Hypertensive heart disease with heart failure: Secondary | ICD-10-CM

## 2021-07-20 DIAGNOSIS — I10 Essential (primary) hypertension: Secondary | ICD-10-CM | POA: Diagnosis not present

## 2021-07-20 DIAGNOSIS — Z993 Dependence on wheelchair: Secondary | ICD-10-CM | POA: Diagnosis not present

## 2021-07-20 DIAGNOSIS — L989 Disorder of the skin and subcutaneous tissue, unspecified: Secondary | ICD-10-CM | POA: Diagnosis not present

## 2021-07-20 DIAGNOSIS — Z7409 Other reduced mobility: Secondary | ICD-10-CM | POA: Diagnosis not present

## 2021-07-20 DIAGNOSIS — R2243 Localized swelling, mass and lump, lower limb, bilateral: Secondary | ICD-10-CM

## 2021-07-21 ENCOUNTER — Ambulatory Visit (HOSPITAL_COMMUNITY): Payer: Medicare PPO

## 2021-07-21 ENCOUNTER — Ambulatory Visit (HOSPITAL_COMMUNITY)
Admission: RE | Admit: 2021-07-21 | Discharge: 2021-07-21 | Disposition: A | Discharge status: Home / Self Care | Payer: Medicare PPO | Source: Ambulatory Visit | Attending: Internal Medicine | Admitting: Internal Medicine

## 2021-07-21 ENCOUNTER — Other Ambulatory Visit: Payer: Self-pay

## 2021-07-21 DIAGNOSIS — R2243 Localized swelling, mass and lump, lower limb, bilateral: Secondary | ICD-10-CM | POA: Insufficient documentation

## 2021-07-21 DIAGNOSIS — I110 Hypertensive heart disease with heart failure: Secondary | ICD-10-CM | POA: Diagnosis not present

## 2021-07-21 DIAGNOSIS — M7989 Other specified soft tissue disorders: Secondary | ICD-10-CM | POA: Diagnosis not present

## 2021-07-22 DIAGNOSIS — G4733 Obstructive sleep apnea (adult) (pediatric): Secondary | ICD-10-CM | POA: Diagnosis not present

## 2021-07-22 DIAGNOSIS — G609 Hereditary and idiopathic neuropathy, unspecified: Secondary | ICD-10-CM | POA: Diagnosis not present

## 2021-07-22 DIAGNOSIS — E559 Vitamin D deficiency, unspecified: Secondary | ICD-10-CM | POA: Diagnosis not present

## 2021-07-22 DIAGNOSIS — L89312 Pressure ulcer of right buttock, stage 2: Secondary | ICD-10-CM | POA: Diagnosis not present

## 2021-07-22 DIAGNOSIS — L989 Disorder of the skin and subcutaneous tissue, unspecified: Secondary | ICD-10-CM | POA: Diagnosis not present

## 2021-07-22 DIAGNOSIS — F329 Major depressive disorder, single episode, unspecified: Secondary | ICD-10-CM | POA: Diagnosis not present

## 2021-07-22 DIAGNOSIS — I110 Hypertensive heart disease with heart failure: Secondary | ICD-10-CM | POA: Diagnosis not present

## 2021-07-22 DIAGNOSIS — I509 Heart failure, unspecified: Secondary | ICD-10-CM | POA: Diagnosis not present

## 2021-07-22 DIAGNOSIS — E782 Mixed hyperlipidemia: Secondary | ICD-10-CM | POA: Diagnosis not present

## 2021-07-26 NOTE — Progress Notes (Signed)
Remote pacemaker transmission.   

## 2021-07-29 DIAGNOSIS — F329 Major depressive disorder, single episode, unspecified: Secondary | ICD-10-CM | POA: Diagnosis not present

## 2021-07-29 DIAGNOSIS — I2510 Atherosclerotic heart disease of native coronary artery without angina pectoris: Secondary | ICD-10-CM | POA: Diagnosis not present

## 2021-08-03 ENCOUNTER — Ambulatory Visit: Payer: Medicare PPO | Admitting: Cardiology

## 2021-08-31 DIAGNOSIS — B372 Candidiasis of skin and nail: Secondary | ICD-10-CM | POA: Insufficient documentation

## 2021-09-21 DIAGNOSIS — L89312 Pressure ulcer of right buttock, stage 2: Secondary | ICD-10-CM | POA: Diagnosis not present

## 2021-09-21 DIAGNOSIS — G609 Hereditary and idiopathic neuropathy, unspecified: Secondary | ICD-10-CM | POA: Diagnosis not present

## 2021-09-21 DIAGNOSIS — I509 Heart failure, unspecified: Secondary | ICD-10-CM | POA: Diagnosis not present

## 2021-09-21 DIAGNOSIS — E782 Mixed hyperlipidemia: Secondary | ICD-10-CM | POA: Diagnosis not present

## 2021-09-21 DIAGNOSIS — L989 Disorder of the skin and subcutaneous tissue, unspecified: Secondary | ICD-10-CM | POA: Diagnosis not present

## 2021-09-21 DIAGNOSIS — F329 Major depressive disorder, single episode, unspecified: Secondary | ICD-10-CM | POA: Diagnosis not present

## 2021-09-21 DIAGNOSIS — E559 Vitamin D deficiency, unspecified: Secondary | ICD-10-CM | POA: Diagnosis not present

## 2021-09-21 DIAGNOSIS — G4733 Obstructive sleep apnea (adult) (pediatric): Secondary | ICD-10-CM | POA: Diagnosis not present

## 2021-09-21 DIAGNOSIS — I110 Hypertensive heart disease with heart failure: Secondary | ICD-10-CM | POA: Diagnosis not present

## 2021-10-10 ENCOUNTER — Ambulatory Visit (INDEPENDENT_AMBULATORY_CARE_PROVIDER_SITE_OTHER): Payer: Medicare PPO

## 2021-10-10 DIAGNOSIS — I442 Atrioventricular block, complete: Secondary | ICD-10-CM | POA: Diagnosis not present

## 2021-10-12 LAB — CUP PACEART REMOTE DEVICE CHECK
Date Time Interrogation Session: 20230612075051
Implantable Lead Implant Date: 20200311
Implantable Lead Implant Date: 20200311
Implantable Lead Location: 753859
Implantable Lead Location: 753860
Implantable Lead Model: 377
Implantable Lead Model: 377
Implantable Lead Serial Number: 81024123
Implantable Lead Serial Number: 81075341
Implantable Pulse Generator Implant Date: 20200311
Pulse Gen Model: 407145
Pulse Gen Serial Number: 69550148

## 2021-10-19 DIAGNOSIS — G4730 Sleep apnea, unspecified: Secondary | ICD-10-CM | POA: Diagnosis not present

## 2021-10-19 DIAGNOSIS — L89301 Pressure ulcer of unspecified buttock, stage 1: Secondary | ICD-10-CM | POA: Insufficient documentation

## 2021-10-19 DIAGNOSIS — Z7409 Other reduced mobility: Secondary | ICD-10-CM | POA: Diagnosis not present

## 2021-10-19 DIAGNOSIS — I110 Hypertensive heart disease with heart failure: Secondary | ICD-10-CM | POA: Diagnosis not present

## 2021-10-19 DIAGNOSIS — E559 Vitamin D deficiency, unspecified: Secondary | ICD-10-CM | POA: Diagnosis not present

## 2021-10-19 DIAGNOSIS — I482 Chronic atrial fibrillation, unspecified: Secondary | ICD-10-CM | POA: Insufficient documentation

## 2021-10-19 DIAGNOSIS — L989 Disorder of the skin and subcutaneous tissue, unspecified: Secondary | ICD-10-CM | POA: Diagnosis not present

## 2021-10-19 DIAGNOSIS — E782 Mixed hyperlipidemia: Secondary | ICD-10-CM | POA: Diagnosis not present

## 2021-10-19 DIAGNOSIS — L89159 Pressure ulcer of sacral region, unspecified stage: Secondary | ICD-10-CM | POA: Insufficient documentation

## 2021-10-19 DIAGNOSIS — G609 Hereditary and idiopathic neuropathy, unspecified: Secondary | ICD-10-CM | POA: Diagnosis not present

## 2021-10-19 DIAGNOSIS — R7301 Impaired fasting glucose: Secondary | ICD-10-CM | POA: Diagnosis not present

## 2021-10-19 DIAGNOSIS — Z950 Presence of cardiac pacemaker: Secondary | ICD-10-CM | POA: Diagnosis not present

## 2021-10-19 DIAGNOSIS — I10 Essential (primary) hypertension: Secondary | ICD-10-CM | POA: Diagnosis not present

## 2021-10-19 DIAGNOSIS — E611 Iron deficiency: Secondary | ICD-10-CM | POA: Diagnosis not present

## 2021-10-19 DIAGNOSIS — T148XXD Other injury of unspecified body region, subsequent encounter: Secondary | ICD-10-CM | POA: Insufficient documentation

## 2021-11-10 ENCOUNTER — Other Ambulatory Visit: Payer: Self-pay | Admitting: *Deleted

## 2021-11-10 DIAGNOSIS — I739 Peripheral vascular disease, unspecified: Secondary | ICD-10-CM

## 2021-11-14 ENCOUNTER — Telehealth: Payer: Self-pay | Admitting: Cardiology

## 2021-11-14 ENCOUNTER — Ambulatory Visit (INDEPENDENT_AMBULATORY_CARE_PROVIDER_SITE_OTHER): Payer: Medicare PPO | Admitting: Cardiology

## 2021-11-14 ENCOUNTER — Encounter: Payer: Self-pay | Admitting: Cardiology

## 2021-11-14 VITALS — BP 138/70 | HR 95 | Ht 66.0 in | Wt 285.0 lb

## 2021-11-14 DIAGNOSIS — Z952 Presence of prosthetic heart valve: Secondary | ICD-10-CM | POA: Diagnosis not present

## 2021-11-14 DIAGNOSIS — R600 Localized edema: Secondary | ICD-10-CM | POA: Diagnosis not present

## 2021-11-14 DIAGNOSIS — E782 Mixed hyperlipidemia: Secondary | ICD-10-CM

## 2021-11-14 DIAGNOSIS — I442 Atrioventricular block, complete: Secondary | ICD-10-CM

## 2021-11-14 NOTE — Patient Instructions (Signed)

## 2021-11-14 NOTE — Progress Notes (Signed)
Cardiology Office Note  Date: 11/14/2021   ID: Dawn Gonzales, DOB [birth year], MRN [id number]  PCP:  Benita Stabile, MD  Cardiologist:  Nona Dell, MD Electrophysiologist:  Lewayne Bunting, MD   Chief Complaint  Patient presents with   Cardiac follow-up    History of Present Illness: Dawn Gonzales is an 81 y.o. female last assessed via telehealth encounter in June 2021.  She is here today with her daughter for a follow-up visit.  Reports no shortness of breath at rest, no palpitations.  She has been experiencing intermittent bilateral lower leg edema with some weeping ulcerations, improving recently.  These are wrapped today.  She has a visit to see Dr. Arbie Cookey in August.  She has a Biotronik pacemaker in place with follow-up with Dr. Ladona Ridgel, history of complete heart block.  Device check in June showed normal function.  Her last echocardiogram was in March 2021 showing LVEF 60 to 65%, TAVR prosthesis with mild to moderate perivalvular regurgitation and a mean gradient of 19 mmHg.  We discussed getting an updated study.  I reviewed her medications, she has been on Demadex 20 mg to 40 mg daily depending on leg swelling.  We are requesting her most recent lab work from Dr. Margo Aye.  It may be that she needs a higher standing dose of Demadex.  I personally reviewed her ECG today which shows sinus rhythm with left anterior fascicular block and right bundle branch block.  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   26 mm Edwards Sapien 3 transcatheter heart valve placed  via percutaneous right transfemoral approach    Severe aortic stenosis    Sleep apnea     Past Surgical History:  Procedure Laterality Date   Abdominal gangrene     ADENOIDECTOMY     Cataract surgery     COLONOSCOPY     ENDOSCOPIC RETROGRADE CHOLANGIOPANCREATOGRAPHY (ERCP) WITH PROPOFOL N/A 02/08/2018   Procedure: ENDOSCOPIC RETROGRADE CHOLANGIOPANCREATOGRAPHY (ERCP) WITH PROPOFOL;  Surgeon: Lemar Lofty., MD;  [facility];  Service: Gastroenterology;  Laterality: N/A;   EUS  02/08/2018   Procedure: UPPER ENDOSCOPIC ULTRASOUND (EUS) LINEAR;  Surgeon: Meridee Score Netty Starring., MD;  Location: [facility] ENDOSCOPY;  Service: Gastroenterology;;   EYE SURGERY     IR FLUORO RM 30-60 MIN  03/27/2018   IR PERC CHOLECYSTOSTOMY  02/09/2018   IR RADIOLOGIST EVAL & MGMT  03/26/2018   IR RADIOLOGY PERIPHERAL GUIDED IV START  05/22/2018   IR RADIOLOGY PERIPHERAL GUIDED IV START  05/30/2018   IR RADIOLOGY PERIPHERAL GUIDED IV START  06/05/2018   IR US GUIDE VASC ACCESS RIGHT  05/22/2018   IR US GUIDE VASC ACCESS RIGHT  05/30/2018   IR US GUIDE VASC ACCESS RIGHT  06/05/2018   PACEMAKER IMPLANT N/A 07/10/2018   Procedure: PACEMAKER IMPLANT;  Surgeon: Marinus Maw, MD;  [facility] LAB;  Service: Cardiovascular;  Laterality: N/A;   REMOVAL OF STONES  02/08/2018  Procedure: REMOVAL OF STONES;  Surgeon: Meridee Score Netty Starring., MD;  Location: [facility] ENDOSCOPY;  Service: Gastroenterology;;   Dennison Mascot  02/08/2018   Procedure: Dennison Mascot;  Surgeon: Mansouraty, Netty Starring., MD;  Location[facility] ENDOSCOPY;  Service: Gastroenterology;;   TONSILLECTOMY     TOOTH EXTRACTION N/A 06/18/2018   Procedure: DENTAL RESTORATION/EXTRACTIONS;  Surgeon: Ocie Doyne, DDS;  Location: [facility] OR;  Service: Oral Surgery;  Laterality: N/A;   TRANSCATHETER AORTIC VALVE REPLACEMENT, TRANSFEMORAL N/A 07/09/2018   Procedure: TRANSCATHETER AORTIC VALVE REPLACEMENT, TRANSFEMORAL;  Surgeon: Tonny Bollman, MD;   Location: [facility] INVASIVE CV LAB;  Service: Cardiovascular;  Laterality: N/A;   Tummy tuck      Current Outpatient Medications  Medication Sig Dispense Refill   acetaminophen (TYLENOL) 325 MG tablet Take 2 tablets (650 mg total) by mouth every 6 (six) hours as needed for mild pain, fever or headache (or Fever >/= 101). 12 tablet 0   albuterol (VENTOLIN HFA) 108 (90 Base) MCG/ACT inhaler Inhale 2 puffs into the lungs every 6 (six) hours as needed.     aspirin EC 81 MG tablet Take 1 tablet (81 mg total) by mouth daily with breakfast. 12 tablet 0   Calcium Carbonate (CALCIUM-CARB 600 PO) Take 600 mg by mouth daily.     cholecalciferol (VITAMIN D) 1000 units tablet Take 1,000 Units by mouth daily.     ferrous sulfate 325 (65 FE) MG tablet Take 325 mg by mouth daily with breakfast.     gabapentin (NEURONTIN) 300 MG capsule Take 300 mg by mouth 3 (three) times daily.     Multiple Vitamin (MULTIVITAMIN WITH MINERALS) TABS tablet Take 1 tablet by mouth daily.     nystatin (MYCOSTATIN/NYSTOP) powder APPLY TO AFFECTED AREA TWICE A DAY     polyethylene glycol (MIRALAX MIX-IN PAX) 17 g packet Take 17 g by mouth daily as needed for mild constipation or moderate constipation. 14 each 0   pravastatin (PRAVACHOL) 20 MG tablet Take 20 mg by mouth daily.     silver sulfADIAZINE (SILVADENE) 1 % cream Apply 1 application topically 2 (two) times daily.     torsemide (DEMADEX) 20 MG tablet Take 1 tablet by mouth daily.     venlafaxine (EFFEXOR) 37.5 MG tablet Take 37.5 mg by mouth daily.     vitamin C (ASCORBIC ACID) 500 MG tablet Take 500 mg by mouth daily.     albuterol (PROVENTIL) (2.5 MG/3ML) 0.083% nebulizer solution Take 3 mLs (2.5 mg total) by nebulization every 6 (six) hours as needed for wheezing or shortness of breath. (Patient not taking: Reported on 10/29/2019) 75 mL 12   metoCLOPramide (REGLAN) 10 MG tablet Take 1 tablet (10 mg total) by mouth every 6 (six) hours as needed for nausea (nausea/headache).  (Patient not taking: Reported on 11/14/2021) 30 tablet 0   ondansetron (ZOFRAN ODT) 4 MG disintegrating tablet 4mg  ODT q4 hours prn nausea/vomit (Patient not taking: Reported on 11/14/2021) 10 tablet 0   traMADol (ULTRAM) 50 MG tablet Take 1 tablet (50 mg total) by mouth every 6 (six) hours as needed for moderate pain. (Patient not taking: Reported on 11/14/2021) 10 tablet 0   No current facility-administered medications for this visit.   Allergies:  Patient has no known allergies.   ROS: No orthopnea or PND.  Physical Exam: VS:  BP 138/70 (BP Location: Right Arm, Patient Position: Sitting, Cuff Size: Large)   Pulse 95   Ht 5\' 6"  (1.676 m)   Wt 285 lb (129.3 kg)   SpO2  91%   BMI 46.00 kg/m , BMI Body mass index is 46 kg/m.  Wt Readings from Last 3 Encounters:  11/14/21 285 lb (129.3 kg)  08/27/19 272 lb 11.3 oz (123.7 kg)  08/15/19 268 lb (121.6 kg)    General: Patient appears comfortable at rest. HEENT: Conjunctiva and lids normal, oropharynx clear. Neck: Supple, no elevated JVP or carotid bruits, no thyromegaly. Lungs: Clear to auscultation, nonlabored breathing at rest. Cardiac: Regular rate and rhythm, no S3, 2-3/6 systolic murmur and 1/6 diastolic murmur, no pericardial rub. Extremities: Bilateral leg edema with venous insufficiency and hemosiderin deposition.  Lower legs dressed.  ECG:  An ECG dated 08/27/2019 was personally reviewed today and demonstrated:  Sinus rhythm with right bundle branch block and left anterior fascicular block.  Recent Labwork:  No interval lab work for review today.  Other Studies Reviewed Today:  Echocardiogram 07/06/2019:  1. Left ventricular ejection fraction, by estimation, is 60 to 65%. The  left ventricle has normal function. The left ventricle has no regional  wall motion abnormalities. There is mild left ventricular hypertrophy.  Left ventricular diastolic parameters  are indeterminate.   2. Right ventricular systolic function is  normal. The right ventricular  size is normal. Tricuspid regurgitation signal is inadequate for assessing  PA pressure.   3. Left atrial size was severely dilated.   4. Right atrial size was mildly dilated.   5. The mitral valve is degenerative with mild thickening and  calcification associated with severe annular calcification. Mild mitral  valve regurgitation. Mild mitral stenosis. The mean mitral valve gradient  is 4.0 mmHg.   6. The aortic valve has been repaired/replaced. There is a 26 mm Edwards  Sapien 3 prosthesis in position. Aortic valve regurgitation is mild to  moderate - perivalvular near septum. Stable mean gradient of 19 mmHg.   7. The inferior vena cava is dilated in size with >50% respiratory  variability, suggesting right atrial pressure of 8 mmHg.   Assessment and Plan:  1.  History of severe aortic stenosis status post TAVR in March 2020.  Last echocardiogram in 2021 showed increased but stable mean gradient of 19 mmHg and mild to moderate paravalvular regurgitation.  We will obtain an updated echocardiogram for surveillance.  2.  Bilateral leg edema with some weeping ulcerations that are improving with wrapping.  May need higher standing dose of Demadex, she has been doubling her 20 mg daily dose as needed.  Requesting interval lab work from PCP for review of renal function and potassium.  3.  History of complete heart block with Biotronik pacemaker in place, device function stable by most recent interrogation.  She follows with Dr. Ladona Ridgel.  4.  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  Medication Adjustments/Labs and Tests Ordered: Current medicines are reviewed at length with the patient today.  Concerns regarding medicines are outlined above.   Tests Ordered: Orders Placed This Encounter  Procedures   EKG 12-Lead   ECHOCARDIOGRAM COMPLETE    Medication Changes: No orders of the defined types were placed in this encounter.   Disposition:   Follow up  6 months.  Signed, Jonelle Sidle, MD, [facility] 11/14/2021 2:48 PM    [facility] at [facility] [address] [place], [place], [place] [address] Phone: [phone]; Fax: [phone]

## 2021-11-14 NOTE — Telephone Encounter (Signed)
Checking percert on the following patient for testing scheduled at [facility].     ECHO      11-28-2021

## 2021-11-28 ENCOUNTER — Ambulatory Visit (HOSPITAL_COMMUNITY)
Admission: RE | Admit: 2021-11-28 | Discharge: 2021-11-28 | Disposition: A | Discharge status: Home / Self Care | Payer: Medicare PPO | Source: Ambulatory Visit | Attending: Cardiology | Admitting: Cardiology

## 2021-11-28 DIAGNOSIS — I442 Atrioventricular block, complete: Secondary | ICD-10-CM | POA: Diagnosis not present

## 2021-11-28 LAB — ECHOCARDIOGRAM COMPLETE
AR max vel: 1.28 cm2
AV Area VTI: 1.32 cm2
AV Area mean vel: 1.6 cm2
AV Mean grad: 13 mmHg
AV Peak grad: 33.6 mmHg
Ao pk vel: 2.9 m/s
Area-P 1/2: 2.73 cm2
S' Lateral: 3.5 cm

## 2021-11-28 MED ORDER — PERFLUTREN LIPID MICROSPHERE
1.0000 mL | INTRAVENOUS | Status: AC | PRN
  Administered 2021-11-28: 3 mL via INTRAVENOUS

## 2021-11-28 NOTE — Progress Notes (Addendum)
*  PRELIMINARY RESULTS* Echocardiogram 2D Echocardiogram has been performed with Definity.  Stacey Drain 11/28/2021, 1:03 PM

## 2021-11-29 ENCOUNTER — Encounter: Payer: Self-pay | Admitting: Internal Medicine

## 2021-11-30 ENCOUNTER — Ambulatory Visit (INDEPENDENT_AMBULATORY_CARE_PROVIDER_SITE_OTHER): Payer: Medicare PPO

## 2021-11-30 ENCOUNTER — Ambulatory Visit (INDEPENDENT_AMBULATORY_CARE_PROVIDER_SITE_OTHER): Payer: Medicare PPO | Admitting: Vascular Surgery

## 2021-11-30 ENCOUNTER — Encounter: Payer: Self-pay | Admitting: Vascular Surgery

## 2021-11-30 VITALS — BP 131/74 | HR 74 | Temp 98.1°F | Resp 18 | Ht 66.0 in | Wt 280.0 lb

## 2021-11-30 DIAGNOSIS — L97919 Non-pressure chronic ulcer of unspecified part of right lower leg with unspecified severity: Secondary | ICD-10-CM

## 2021-11-30 DIAGNOSIS — I83019 Varicose veins of right lower extremity with ulcer of unspecified site: Secondary | ICD-10-CM

## 2021-11-30 DIAGNOSIS — I739 Peripheral vascular disease, unspecified: Secondary | ICD-10-CM

## 2021-11-30 DIAGNOSIS — I83029 Varicose veins of left lower extremity with ulcer of unspecified site: Secondary | ICD-10-CM | POA: Diagnosis not present

## 2021-11-30 DIAGNOSIS — L97929 Non-pressure chronic ulcer of unspecified part of left lower leg with unspecified severity: Secondary | ICD-10-CM | POA: Diagnosis not present

## 2021-11-30 NOTE — Progress Notes (Signed)
[facility] [place]  Patient name: Dawn Gonzales MRN: [id number] DOB: April 03, 1941 Sex: female  REASON FOR CONSULT: Evaluation bilateral lower extremity ulceration, rule out arterial insufficiency  HPI: Dawn Gonzales is a 81 y.o. female, who is here today for evaluation of bilateral lower extremity ulcers and to rule out arterial insufficiency.  She has a very long history of venous stasis disease.  She has had ulceration for many years but reports it has been sometime since she had to have Unna boot placement for treatment of these.  She has extensive hemosiderin deposits bilaterally.  She has severe lower extremity chronic swelling.  She denies any prior known history of DVT.  Her most recent venous study in March 2023 showed no evidence of DVT.  She has no history of tissue loss in her feet bilaterally.  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   26 mm Edwards Sapien 3 transcatheter heart valve placed via percutaneous right transfemoral approach    Severe aortic stenosis    Sleep apnea     Family History  Problem Relation Age of Onset   Cancer Mother    Hypertension Father    Arthritis Father    Heart attack Father     SOCIAL HISTORY: Social History   Socioeconomic History   Marital status: Married    Spouse name: Not on file   Number of children: 3   Years of education: Not on file   Highest education level: Not on file  Occupational History   Occupation: Retired houswife  Tobacco Use    Smoking status: Former    Types: Cigarettes    Quit date: 05/23/1978    Years since quitting: 43.5    Passive exposure: Never   Smokeless tobacco: Never  Vaping Use   Vaping Use: Never used  Substance and Sexual Activity   Alcohol use: Not Currently    Comment: Occasional   Drug use: Never   Sexual activity: Not on file  Other Topics Concern   Not on file  Social History Narrative   Not on file   Social Determinants of Health   Financial Resource Strain: Not on file  Food Insecurity: Not on file  Transportation Needs: Not on file  Physical Activity: Not on file  Stress: Not on file  Social Connections: Not on file  Intimate Partner Violence: Not on file    No Known Allergies  Current Outpatient Medications  Medication Sig Dispense Refill   acetaminophen (TYLENOL) 325 MG tablet Take 2 tablets (650 mg total) by mouth every 6 (six) hours as needed for mild pain, fever or headache (or Fever >/= 101). 12 tablet 0   albuterol (PROVENTIL) (2.5 MG/3ML) 0.083% nebulizer solution Take 3 mLs (2.5 mg total) by nebulization every 6 (six) hours as needed for wheezing or shortness of breath. 75 mL 12   albuterol (VENTOLIN HFA) 108 (90 Base) MCG/ACT inhaler Inhale 2 puffs into the lungs every 6 (  six) hours as needed.     aspirin EC 81 MG tablet Take 1 tablet (81 mg total) by mouth daily with breakfast. 12 tablet 0   Calcium Carbonate (CALCIUM-CARB 600 PO) Take 600 mg by mouth daily.     cholecalciferol (VITAMIN D) 1000 units tablet Take 1,000 Units by mouth daily.     ferrous sulfate 325 (65 FE) MG tablet Take 325 mg by mouth daily with breakfast.     gabapentin (NEURONTIN) 300 MG capsule Take 300 mg by mouth 3 (three) times daily.     metoCLOPramide (REGLAN) 10 MG tablet Take 1 tablet (10 mg total) by mouth every 6 (six) hours as needed for nausea (nausea/headache). 30 tablet 0   Multiple Vitamin (MULTIVITAMIN WITH MINERALS) TABS tablet Take 1 tablet by mouth daily.     nystatin  (MYCOSTATIN/NYSTOP) powder APPLY TO AFFECTED AREA TWICE A DAY     ondansetron (ZOFRAN ODT) 4 MG disintegrating tablet 4mg  ODT q4 hours prn nausea/vomit 10 tablet 0   polyethylene glycol (MIRALAX MIX-IN PAX) 17 g packet Take 17 g by mouth daily as needed for mild constipation or moderate constipation. 14 each 0   pravastatin (PRAVACHOL) 20 MG tablet Take 20 mg by mouth daily.     silver sulfADIAZINE (SILVADENE) 1 % cream Apply 1 application topically 2 (two) times daily.     torsemide (DEMADEX) 20 MG tablet Take 1 tablet by mouth daily.     traMADol (ULTRAM) 50 MG tablet Take 1 tablet (50 mg total) by mouth every 6 (six) hours as needed for moderate pain. 10 tablet 0   venlafaxine (EFFEXOR) 37.5 MG tablet Take 37.5 mg by mouth daily.     vitamin C (ASCORBIC ACID) 500 MG tablet Take 500 mg by mouth daily.     No current facility-administered medications for this visit.    REVIEW OF SYSTEMS:  [X]  denotes positive finding, [ ]  denotes negative finding Cardiac  Comments:  Chest pain or chest pressure:    Shortness of breath upon exertion: x   Short of breath when lying flat:    Irregular heart rhythm:        Vascular    Pain in calf, thigh, or hip brought on by ambulation:    Pain in feet at night that wakes you up from your sleep:     Blood clot in your veins:    Leg swelling:  x       Pulmonary    Oxygen at home:    Productive cough:     Wheezing:  x       Neurologic    Sudden weakness in arms or legs:     Sudden numbness in arms or legs:     Sudden onset of difficulty speaking or slurred speech:    Temporary loss of vision in one eye:     Problems with dizziness:         Gastrointestinal    Blood in stool:     Vomited blood:         Genitourinary    Burning when urinating:     Blood in urine:        Psychiatric    Major depression:         Hematologic    Bleeding problems:    Problems with blood clotting too easily:        Skin    Rashes or ulcers: x        Constitutional  Fever or chills:      PHYSICAL EXAM: Vitals:   11/30/21 1412  BP: 131/74  Pulse: 74  Resp: 18  Temp: 98.1 F (36.7 C)  TempSrc: Temporal  SpO2: (!) 88%  Weight: 280 lb (127 kg)  Height: 5\' 6"  (1.676 m)    GENERAL: The patient is a well-nourished female, in no acute distress. The vital signs are documented above. CARDIOVASCULAR: 2+ radial pulses bilaterally.  2+ left dorsalis pedis pulse.  Do not feel a pulse in her right leg.  She does have extensive swelling making pulse examination difficult.  She does have severe swelling in both lower extremities right greater than left extending down into her feet bilaterally PULMONARY: There is good air exchange  MUSCULOSKELETAL: There are no major deformities or cyanosis. NEUROLOGIC: No focal weakness or paresthesias are detected. SKIN: Marked, extensive changes of chronic venous hypertension with hemosiderin deposits bilaterally.  She has wraps over her mid calves bilaterally from wound care PSYCHIATRIC: The patient has a normal affect.  DATA:  Noninvasive studies reveal normal ankle arm index and normal triphasic waveforms in both lower extremities  MEDICAL ISSUES: I discussed these findings at length with the patient.  She does not have any evidence of lower extremity arterial sufficiency.  She does have severe chronic venous stasis disease with chronic venous stasis ulcers bilaterally.  I would agree with her current treatment regarding her venous stasis ulcers.  Would recommend adding Ace wrap for compression.  She would not be able to wear compression currently due to the ulceration.  Also I feel that it is impractical for her to be able to wear compression due to her obesity and limited mobility.  She is wheelchair-bound.  She was reassured that this is not limb threatening and no other intervention is required.  She will see on an as-needed basis   [place], MD [facility]  [facility] Tel [phone] Pager [phone]  Note: Portions of this report may have been transcribed using voice recognition software.  Every effort has been made to ensure accuracy; however, inadvertent computerized transcription errors may still be present.

## 2022-01-09 ENCOUNTER — Ambulatory Visit (INDEPENDENT_AMBULATORY_CARE_PROVIDER_SITE_OTHER): Payer: Medicare PPO

## 2022-01-09 DIAGNOSIS — I442 Atrioventricular block, complete: Secondary | ICD-10-CM

## 2022-01-10 LAB — CUP PACEART REMOTE DEVICE CHECK
Date Time Interrogation Session: 20230911131802
Implantable Lead Implant Date: 20200311
Implantable Lead Implant Date: 20200311
Implantable Lead Location: 753859
Implantable Lead Location: 753860
Implantable Lead Model: 377
Implantable Lead Model: 377
Implantable Lead Serial Number: 81024123
Implantable Lead Serial Number: 81075341
Implantable Pulse Generator Implant Date: 20200311
Pulse Gen Model: 407145
Pulse Gen Serial Number: 69550148

## 2022-01-25 DIAGNOSIS — I872 Venous insufficiency (chronic) (peripheral): Secondary | ICD-10-CM | POA: Diagnosis not present

## 2022-01-25 DIAGNOSIS — T148XXA Other injury of unspecified body region, initial encounter: Secondary | ICD-10-CM | POA: Diagnosis not present

## 2022-01-25 DIAGNOSIS — R600 Localized edema: Secondary | ICD-10-CM | POA: Insufficient documentation

## 2022-01-25 DIAGNOSIS — L989 Disorder of the skin and subcutaneous tissue, unspecified: Secondary | ICD-10-CM | POA: Diagnosis not present

## 2022-01-25 DIAGNOSIS — T148XXD Other injury of unspecified body region, subsequent encounter: Secondary | ICD-10-CM | POA: Diagnosis not present

## 2022-01-25 DIAGNOSIS — L8996 Pressure-induced deep tissue damage of unspecified site: Secondary | ICD-10-CM | POA: Insufficient documentation

## 2022-01-26 NOTE — Progress Notes (Signed)
Remote pacemaker transmission.   

## 2022-03-02 ENCOUNTER — Telehealth: Payer: Self-pay | Admitting: Cardiology

## 2022-03-02 NOTE — Telephone Encounter (Signed)
Contacted patient to get more information Denies dizziness or chest pain. Reports having chest pain all the time and its not any worse. Says she has used her inhaler this morning. Patient did not sound SOB and no wheezing heard. Says she was wheezing earlier due to having to cough. Says after she coughed the wheezing stopped.  Patient reports her swelling has improved.  Says she does not weigh daily Reports that she feels fine and is not having any issues at this time Confirmed that she take torsemide 40 mg daily Advised to monitor for change in symptoms and to contact our office if she has any problems. Verbalized understanding.

## 2022-03-02 NOTE — Telephone Encounter (Signed)
Seen by Elmyra Ricks, (PT with [facility]) today for first visit who reports patient is showing signs of fluid overload. Says patient is not able to weigh accurately due to physical condition and balance. Reports weight was 270 lbs 3 weeks ago and today is 287 lbs with patient holding down on rails. Reports fluctuation in O2 Sat. O2 Sat 87-91 % at rest; Temp 97; BP 118/70; HR 79, Resp 18 Has swelling and SOB all the time. Reports patient says there is no increase swelling or SOB Denies chest pain or dizziness. Says patient reports sleeping in a recliner all the time Per PT, wheezing was heard.  Advised that patient would be contacted to get more information and review medications.

## 2022-03-21 ENCOUNTER — Other Ambulatory Visit (HOSPITAL_COMMUNITY): Payer: Self-pay | Admitting: Nurse Practitioner

## 2022-03-21 ENCOUNTER — Ambulatory Visit (HOSPITAL_COMMUNITY): Admission: RE | Admit: 2022-03-21 | Payer: Medicare PPO | Source: Ambulatory Visit

## 2022-03-21 DIAGNOSIS — R0600 Dyspnea, unspecified: Secondary | ICD-10-CM | POA: Insufficient documentation

## 2022-03-21 DIAGNOSIS — R0602 Shortness of breath: Secondary | ICD-10-CM | POA: Insufficient documentation

## 2022-04-05 ENCOUNTER — Emergency Department (HOSPITAL_COMMUNITY): Payer: Medicare PPO

## 2022-04-05 ENCOUNTER — Encounter (HOSPITAL_COMMUNITY): Payer: Self-pay

## 2022-04-05 ENCOUNTER — Inpatient Hospital Stay (HOSPITAL_COMMUNITY)
Admission: EM | Admit: 2022-04-05 | Discharge: 2022-04-07 | DRG: 291 | Disposition: A | Discharge status: Skilled Nursing Facility | Payer: Medicare PPO | Attending: Family Medicine | Admitting: Family Medicine

## 2022-04-05 ENCOUNTER — Other Ambulatory Visit: Payer: Self-pay

## 2022-04-05 DIAGNOSIS — E876 Hypokalemia: Secondary | ICD-10-CM | POA: Diagnosis present

## 2022-04-05 DIAGNOSIS — Z66 Do not resuscitate: Secondary | ICD-10-CM | POA: Diagnosis present

## 2022-04-05 DIAGNOSIS — I441 Atrioventricular block, second degree: Secondary | ICD-10-CM | POA: Diagnosis present

## 2022-04-05 DIAGNOSIS — Z87891 Personal history of nicotine dependence: Secondary | ICD-10-CM

## 2022-04-05 DIAGNOSIS — E785 Hyperlipidemia, unspecified: Secondary | ICD-10-CM | POA: Diagnosis present

## 2022-04-05 DIAGNOSIS — R0602 Shortness of breath: Secondary | ICD-10-CM | POA: Diagnosis present

## 2022-04-05 DIAGNOSIS — I4510 Unspecified right bundle-branch block: Secondary | ICD-10-CM | POA: Diagnosis not present

## 2022-04-05 DIAGNOSIS — J9601 Acute respiratory failure with hypoxia: Secondary | ICD-10-CM | POA: Diagnosis present

## 2022-04-05 DIAGNOSIS — Z8673 Personal history of transient ischemic attack (TIA), and cerebral infarction without residual deficits: Secondary | ICD-10-CM

## 2022-04-05 DIAGNOSIS — I5032 Chronic diastolic (congestive) heart failure: Secondary | ICD-10-CM | POA: Diagnosis not present

## 2022-04-05 DIAGNOSIS — Z950 Presence of cardiac pacemaker: Secondary | ICD-10-CM

## 2022-04-05 DIAGNOSIS — G4733 Obstructive sleep apnea (adult) (pediatric): Secondary | ICD-10-CM | POA: Diagnosis present

## 2022-04-05 DIAGNOSIS — I890 Lymphedema, not elsewhere classified: Secondary | ICD-10-CM | POA: Diagnosis present

## 2022-04-05 DIAGNOSIS — I110 Hypertensive heart disease with heart failure: Secondary | ICD-10-CM | POA: Diagnosis present

## 2022-04-05 DIAGNOSIS — E782 Mixed hyperlipidemia: Secondary | ICD-10-CM | POA: Diagnosis present

## 2022-04-05 DIAGNOSIS — Z1152 Encounter for screening for COVID-19: Secondary | ICD-10-CM

## 2022-04-05 DIAGNOSIS — R279 Unspecified lack of coordination: Secondary | ICD-10-CM | POA: Diagnosis not present

## 2022-04-05 DIAGNOSIS — J9811 Atelectasis: Secondary | ICD-10-CM | POA: Diagnosis present

## 2022-04-05 DIAGNOSIS — Z6841 Body Mass Index (BMI) 40.0 and over, adult: Secondary | ICD-10-CM | POA: Diagnosis not present

## 2022-04-05 DIAGNOSIS — J189 Pneumonia, unspecified organism: Secondary | ICD-10-CM | POA: Diagnosis not present

## 2022-04-05 DIAGNOSIS — Z8249 Family history of ischemic heart disease and other diseases of the circulatory system: Secondary | ICD-10-CM | POA: Diagnosis not present

## 2022-04-05 DIAGNOSIS — I7121 Aneurysm of the ascending aorta, without rupture: Secondary | ICD-10-CM | POA: Diagnosis not present

## 2022-04-05 DIAGNOSIS — Z809 Family history of malignant neoplasm, unspecified: Secondary | ICD-10-CM

## 2022-04-05 DIAGNOSIS — I350 Nonrheumatic aortic (valve) stenosis: Secondary | ICD-10-CM

## 2022-04-05 DIAGNOSIS — R000 Tachycardia, unspecified: Secondary | ICD-10-CM | POA: Diagnosis not present

## 2022-04-05 DIAGNOSIS — L89319 Pressure ulcer of right buttock, unspecified stage: Secondary | ICD-10-CM | POA: Diagnosis present

## 2022-04-05 DIAGNOSIS — E669 Obesity, unspecified: Secondary | ICD-10-CM | POA: Diagnosis present

## 2022-04-05 DIAGNOSIS — I878 Other specified disorders of veins: Secondary | ICD-10-CM | POA: Diagnosis present

## 2022-04-05 DIAGNOSIS — I10 Essential (primary) hypertension: Secondary | ICD-10-CM | POA: Diagnosis present

## 2022-04-05 DIAGNOSIS — G629 Polyneuropathy, unspecified: Secondary | ICD-10-CM | POA: Diagnosis present

## 2022-04-05 DIAGNOSIS — L97301 Non-pressure chronic ulcer of unspecified ankle limited to breakdown of skin: Secondary | ICD-10-CM | POA: Diagnosis not present

## 2022-04-05 DIAGNOSIS — R069 Unspecified abnormalities of breathing: Secondary | ICD-10-CM | POA: Diagnosis not present

## 2022-04-05 DIAGNOSIS — R0902 Hypoxemia: Principal | ICD-10-CM

## 2022-04-05 DIAGNOSIS — I83003 Varicose veins of unspecified lower extremity with ulcer of ankle: Secondary | ICD-10-CM | POA: Diagnosis not present

## 2022-04-05 DIAGNOSIS — G4730 Sleep apnea, unspecified: Secondary | ICD-10-CM | POA: Diagnosis present

## 2022-04-05 DIAGNOSIS — Z79899 Other long term (current) drug therapy: Secondary | ICD-10-CM | POA: Diagnosis not present

## 2022-04-05 DIAGNOSIS — I5033 Acute on chronic diastolic (congestive) heart failure: Secondary | ICD-10-CM | POA: Diagnosis present

## 2022-04-05 DIAGNOSIS — R2681 Unsteadiness on feet: Secondary | ICD-10-CM | POA: Diagnosis not present

## 2022-04-05 DIAGNOSIS — Z953 Presence of xenogenic heart valve: Secondary | ICD-10-CM

## 2022-04-05 DIAGNOSIS — Z9981 Dependence on supplemental oxygen: Secondary | ICD-10-CM | POA: Diagnosis not present

## 2022-04-05 DIAGNOSIS — Z8261 Family history of arthritis: Secondary | ICD-10-CM

## 2022-04-05 DIAGNOSIS — I442 Atrioventricular block, complete: Secondary | ICD-10-CM | POA: Diagnosis not present

## 2022-04-05 DIAGNOSIS — I872 Venous insufficiency (chronic) (peripheral): Secondary | ICD-10-CM | POA: Diagnosis present

## 2022-04-05 DIAGNOSIS — J9611 Chronic respiratory failure with hypoxia: Secondary | ICD-10-CM | POA: Diagnosis not present

## 2022-04-05 DIAGNOSIS — Z7982 Long term (current) use of aspirin: Secondary | ICD-10-CM | POA: Diagnosis not present

## 2022-04-05 DIAGNOSIS — M6281 Muscle weakness (generalized): Secondary | ICD-10-CM | POA: Diagnosis not present

## 2022-04-05 LAB — BRAIN NATRIURETIC PEPTIDE: B Natriuretic Peptide: 183 pg/mL — ABNORMAL HIGH (ref 0.0–100.0)

## 2022-04-05 LAB — COMPREHENSIVE METABOLIC PANEL
ALT: 13 U/L (ref 0–44)
AST: 17 U/L (ref 15–41)
Albumin: 3.7 g/dL (ref 3.5–5.0)
Alkaline Phosphatase: 64 U/L (ref 38–126)
Anion gap: 9 (ref 5–15)
BUN: 19 mg/dL (ref 8–23)
CO2: 35 mmol/L — ABNORMAL HIGH (ref 22–32)
Calcium: 9.1 mg/dL (ref 8.9–10.3)
Chloride: 96 mmol/L — ABNORMAL LOW (ref 98–111)
Creatinine, Ser: 0.91 mg/dL (ref 0.44–1.00)
GFR, Estimated: >60 mL/min (ref >60)
Glucose, Bld: 116 mg/dL — ABNORMAL HIGH (ref 70–99)
Potassium: 3.4 mmol/L — ABNORMAL LOW (ref 3.5–5.1)
Sodium: 140 mmol/L (ref 135–145)
Total Bilirubin: 0.5 mg/dL (ref 0.3–1.2)
Total Protein: 7.5 g/dL (ref 6.5–8.1)

## 2022-04-05 LAB — RESP PANEL BY RT-PCR (FLU A&B, COVID) ARPGX2
Influenza A by PCR: NEGATIVE
Influenza B by PCR: NEGATIVE
SARS Coronavirus 2 by RT PCR: NEGATIVE

## 2022-04-05 LAB — URINALYSIS, ROUTINE W REFLEX MICROSCOPIC
Bilirubin Urine: NEGATIVE
Glucose, UA: NEGATIVE mg/dL
Hgb urine dipstick: NEGATIVE
Ketones, ur: NEGATIVE mg/dL
Leukocytes,Ua: NEGATIVE
Nitrite: NEGATIVE
Protein, ur: NEGATIVE mg/dL
Specific Gravity, Urine: 1.005 (ref 1.005–1.030)
pH: 7 (ref 5.0–8.0)

## 2022-04-05 LAB — CBC WITH DIFFERENTIAL/PLATELET
Abs Immature Granulocytes: 0.02 10*3/uL (ref 0.00–0.07)
Basophils Absolute: 0 10*3/uL (ref 0.0–0.1)
Basophils Relative: 0 %
Eosinophils Absolute: 0.2 10*3/uL (ref 0.0–0.5)
Eosinophils Relative: 2 %
HCT: 43.3 % (ref 36.0–46.0)
Hemoglobin: 13.4 g/dL (ref 12.0–15.0)
Immature Granulocytes: 0 %
Lymphocytes Relative: 11 %
Lymphs Abs: 0.9 10*3/uL (ref 0.7–4.0)
MCH: 29.6 pg (ref 26.0–34.0)
MCHC: 30.9 g/dL (ref 30.0–36.0)
MCV: 95.8 fL (ref 80.0–100.0)
Monocytes Absolute: 0.6 10*3/uL (ref 0.1–1.0)
Monocytes Relative: 8 %
Neutro Abs: 6.2 10*3/uL (ref 1.7–7.7)
Neutrophils Relative %: 79 %
Platelets: 298 10*3/uL (ref 150–400)
RBC: 4.52 MIL/uL (ref 3.87–5.11)
RDW: 16.9 % — ABNORMAL HIGH (ref 11.5–15.5)
WBC: 8 10*3/uL (ref 4.0–10.5)
nRBC: 0 % (ref 0.0–0.2)

## 2022-04-05 LAB — PROTIME-INR
INR: 1.1 (ref 0.8–1.2)
Prothrombin Time: 13.8 seconds (ref 11.4–15.2)

## 2022-04-05 LAB — TROPONIN I (HIGH SENSITIVITY)
Troponin I (High Sensitivity): 26 ng/L — ABNORMAL HIGH (ref <18)
Troponin I (High Sensitivity): 27 ng/L — ABNORMAL HIGH (ref <18)

## 2022-04-05 LAB — D-DIMER, QUANTITATIVE: D-Dimer, Quant: 1.04 ug/mL-FEU — ABNORMAL HIGH (ref 0.00–0.50)

## 2022-04-05 LAB — LACTIC ACID, PLASMA
Lactic Acid, Venous: 1.2 mmol/L (ref 0.5–1.9)
Lactic Acid, Venous: 1 mmol/L (ref 0.5–1.9)

## 2022-04-05 LAB — MAGNESIUM: Magnesium: 2.2 mg/dL (ref 1.7–2.4)

## 2022-04-05 MED ORDER — VENLAFAXINE HCL 37.5 MG PO TABS
37.5000 mg | ORAL_TABLET | Freq: Every day | ORAL | Status: DC
  Administered 2022-04-06 – 2022-04-07 (×2): 37.5 mg via ORAL
  Filled 2022-04-05 (×2): qty 1

## 2022-04-05 MED ORDER — ENOXAPARIN SODIUM 40 MG/0.4ML IJ SOSY
40.0000 mg | PREFILLED_SYRINGE | INTRAMUSCULAR | Status: DC
  Administered 2022-04-06 – 2022-04-07 (×2): 40 mg via SUBCUTANEOUS
  Filled 2022-04-05 (×2): qty 0.4

## 2022-04-05 MED ORDER — PRAVASTATIN SODIUM 10 MG PO TABS: 20.0000 mg | ORAL_TABLET | Freq: Every day | ORAL | Status: DC

## 2022-04-05 MED ORDER — IPRATROPIUM-ALBUTEROL 0.5-2.5 (3) MG/3ML IN SOLN
3.0000 mL | Freq: Three times a day (TID) | RESPIRATORY_TRACT | Status: DC
  Administered 2022-04-06: 3 mL via RESPIRATORY_TRACT
  Filled 2022-04-05: qty 3

## 2022-04-05 MED ORDER — VITAMIN D 25 MCG (1000 UNIT) PO TABS
1000.0000 [IU] | ORAL_TABLET | Freq: Every day | ORAL | Status: DC
  Administered 2022-04-06 – 2022-04-07 (×2): 1000 [IU] via ORAL
  Filled 2022-04-05 (×2): qty 1

## 2022-04-05 MED ORDER — ALBUTEROL SULFATE (2.5 MG/3ML) 0.083% IN NEBU: 2.5000 mg | INHALATION_SOLUTION | RESPIRATORY_TRACT | Status: DC | PRN

## 2022-04-05 MED ORDER — VENLAFAXINE HCL 75 MG PO TABS: 37.5000 mg | ORAL_TABLET | Freq: Every day | ORAL | Status: DC

## 2022-04-05 MED ORDER — FUROSEMIDE 10 MG/ML IJ SOLN
40.0000 mg | Freq: Two times a day (BID) | INTRAMUSCULAR | Status: DC
  Administered 2022-04-06 – 2022-04-07 (×3): 40 mg via INTRAVENOUS
  Filled 2022-04-05 (×3): qty 4

## 2022-04-05 MED ORDER — POLYETHYLENE GLYCOL 3350 17 G PO PACK
17.0000 g | PACK | Freq: Every day | ORAL | Status: DC | PRN
  Administered 2022-04-06: 17 g via ORAL
  Filled 2022-04-05: qty 1

## 2022-04-05 MED ORDER — SODIUM CHLORIDE 0.9 % IV SOLN
1.0000 g | Freq: Once | INTRAVENOUS | Status: AC
  Administered 2022-04-05: 1 g via INTRAVENOUS
  Filled 2022-04-05: qty 10

## 2022-04-05 MED ORDER — METHYLPREDNISOLONE SODIUM SUCC 125 MG IJ SOLR
80.0000 mg | Freq: Every day | INTRAMUSCULAR | Status: DC
  Administered 2022-04-06 – 2022-04-07 (×2): 80 mg via INTRAVENOUS
  Filled 2022-04-05 (×2): qty 2

## 2022-04-05 MED ORDER — METHYLPREDNISOLONE SODIUM SUCC 125 MG IJ SOLR
125.0000 mg | Freq: Once | INTRAMUSCULAR | Status: AC
  Administered 2022-04-05: 125 mg via INTRAVENOUS
  Filled 2022-04-05: qty 2

## 2022-04-05 MED ORDER — GABAPENTIN 300 MG PO CAPS
300.0000 mg | ORAL_CAPSULE | Freq: Three times a day (TID) | ORAL | Status: DC
  Administered 2022-04-05 – 2022-04-07 (×5): 300 mg via ORAL
  Filled 2022-04-05 (×5): qty 1

## 2022-04-05 MED ORDER — POTASSIUM CHLORIDE CRYS ER 20 MEQ PO TBCR
20.0000 meq | EXTENDED_RELEASE_TABLET | Freq: Once | ORAL | Status: AC
  Administered 2022-04-05: 20 meq via ORAL
  Filled 2022-04-05: qty 1

## 2022-04-05 MED ORDER — ASPIRIN 81 MG PO TBEC
81.0000 mg | DELAYED_RELEASE_TABLET | Freq: Every day | ORAL | Status: DC
  Administered 2022-04-06 – 2022-04-07 (×2): 81 mg via ORAL
  Filled 2022-04-05 (×2): qty 1

## 2022-04-05 MED ORDER — PRAVASTATIN SODIUM 10 MG PO TABS
20.0000 mg | ORAL_TABLET | Freq: Every day | ORAL | Status: DC
  Administered 2022-04-05 – 2022-04-06 (×2): 20 mg via ORAL
  Filled 2022-04-05 (×2): qty 2

## 2022-04-05 MED ORDER — IPRATROPIUM-ALBUTEROL 0.5-2.5 (3) MG/3ML IN SOLN
3.0000 mL | RESPIRATORY_TRACT | Status: DC
  Administered 2022-04-05: 3 mL via RESPIRATORY_TRACT
  Filled 2022-04-05: qty 3

## 2022-04-05 MED ORDER — AZITHROMYCIN 250 MG PO TABS
500.0000 mg | ORAL_TABLET | Freq: Every day | ORAL | Status: DC
  Administered 2022-04-05 – 2022-04-07 (×3): 500 mg via ORAL
  Filled 2022-04-05 (×3): qty 2

## 2022-04-05 MED ORDER — FUROSEMIDE 10 MG/ML IJ SOLN
40.0000 mg | Freq: Once | INTRAMUSCULAR | Status: AC
  Administered 2022-04-05: 40 mg via INTRAVENOUS
  Filled 2022-04-05: qty 4

## 2022-04-05 MED ORDER — IOHEXOL 350 MG/ML SOLN
100.0000 mL | Freq: Once | INTRAVENOUS | Status: AC | PRN
  Administered 2022-04-05: 80 mL via INTRAVENOUS

## 2022-04-05 NOTE — H&P (Signed)
TRH H&P   Patient Demographics:    Dawn Gonzales, is a 81 y.o. female  MRN: [id number]   DOB - May 05, 1940  Admit Date - 04/05/2022  Outpatient Primary MD for the patient is Benita Stabile, MD  Referring MD/NP/PA: PA Idol  Outpatient Specialists: cardiology Dr Diona Browner    Patient coming from: home  Chief Complaint  Patient presents with   Shortness of Breath      HPI:    Dawn Gonzales  is a 81 y.o. female, with past medical history of complete heart block, has pacemaker, 3 of severe aortic stenosis, status post TAVR, lymphedema, venous stasis, following with wound clinic and vascular surgery, hyperlipidemia, chronic diastolic CHF, endocarditis, history of CVA, hypertension,. -Patient ports cough, dyspnea over the last 2 weeks, she has pulse ox at home, for which she noted her oxygen saturation to be in the mid 80s, she denies any dyspnea at rest, but it is exacerbated by activity, was referred to have chest x-ray by her PCP, she was significantly dyspneic, her oxygen saturation noted to be in the mid 70s when she arrived to EMS, she had mild wheezing initially, but this has resolved after receiving IV steroids, patient denies fever, chills, chest pain, hemoptysis, or coffee-ground emesis, she reports chronic lower extremity lymphedema, appears to be stable at her baseline. - in ED usual saturation was in the mid 70s, but this has improved with oxygen, she had wheezing which has improved with IV steroids, CTA chest negative for PE, but significant for airway inflammation, dyspnea much improved with steroids and nebulizer treatment, BNP was mildly elevated, some vascular congestion noted on imaging as well, so she received Lasix, [facility] consulted to admit for new oxygen requirement.    Review of systems:     A full 10 point Review of Systems was done, except as  stated above, all other Review of Systems were negative.   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   26 mm Edwards Sapien 3 transcatheter heart valve placed via percutaneous right transfemoral approach    Severe aortic stenosis    Sleep apnea       Past Surgical History:  Procedure Laterality Date   Abdominal gangrene     ADENOIDECTOMY     Cataract surgery     COLONOSCOPY  ENDOSCOPIC RETROGRADE CHOLANGIOPANCREATOGRAPHY (ERCP) WITH PROPOFOL N/A 02/08/2018   Procedure: ENDOSCOPIC RETROGRADE CHOLANGIOPANCREATOGRAPHY (ERCP) WITH PROPOFOL;  Surgeon: Meridee Score Netty Starring., MD;  Location: [facility] ENDOSCOPY;  Service: Gastroenterology;  Laterality: N/A;   EUS  02/08/2018   Procedure: UPPER ENDOSCOPIC ULTRASOUND (EUS) LINEAR;  Surgeon: Meridee Score Netty Starring., MD;  Location: [facility] ENDOSCOPY;  Service: Gastroenterology;;   EYE SURGERY     IR FLUORO RM 30-60 MIN  03/27/2018   IR PERC CHOLECYSTOSTOMY  02/09/2018   IR RADIOLOGIST EVAL & MGMT  03/26/2018   IR RADIOLOGY PERIPHERAL GUIDED IV START  05/22/2018   IR RADIOLOGY PERIPHERAL GUIDED IV START  05/30/2018   IR RADIOLOGY PERIPHERAL GUIDED IV START  06/05/2018   IR US GUIDE VASC ACCESS RIGHT  05/22/2018   IR US GUIDE VASC ACCESS RIGHT  05/30/2018   IR US GUIDE VASC ACCESS RIGHT  06/05/2018   PACEMAKER IMPLANT N/A 07/10/2018   Procedure: PACEMAKER IMPLANT;  Surgeon: Marinus Maw, MD;  Location: [facility] INVASIVE CV LAB;  Service: Cardiovascular;  Laterality: N/A;   REMOVAL OF STONES  02/08/2018    Procedure: REMOVAL OF STONES;  Surgeon: Meridee Score Netty Starring., MD;  Location: [facility] ENDOSCOPY;  Service: Gastroenterology;;   Dennison Mascot  02/08/2018   Procedure: Dennison Mascot;  Surgeon: Mansouraty, Netty Starring., MD;  Location: [facility] ENDOSCOPY;  Service: Gastroenterology;;   TONSILLECTOMY     TOOTH EXTRACTION N/A 06/18/2018   Procedure: DENTAL RESTORATION/EXTRACTIONS;  Surgeon: Ocie Doyne, DDS;  Location: [facility] OR;  Service: Oral Surgery;  Laterality: N/A;   TRANSCATHETER AORTIC VALVE REPLACEMENT, TRANSFEMORAL N/A 07/09/2018   Procedure: TRANSCATHETER AORTIC VALVE REPLACEMENT, TRANSFEMORAL;  Surgeon: Tonny Bollman, MD;  Location: [facility] INVASIVE CV LAB;  Service: Cardiovascular;  Laterality: N/A;   Tummy tuck        Social History:     Social History   Tobacco Use   Smoking status: Former    Types: Cigarettes    Quit date: 05/23/1978    Years since quitting: 43.8    Passive exposure: Never   Smokeless tobacco: Never  Substance Use Topics   Alcohol use: Not Currently    Comment: Occasional        Family History :     Family History  Problem Relation Age of Onset   Cancer Mother    Hypertension Father    Arthritis Father    Heart attack Father      Home Medications:   Prior to Admission medications   Medication Sig Start Date End Date Taking? Authorizing Provider  acetaminophen (TYLENOL) 325 MG tablet Take 2 tablets (650 mg total) by mouth every 6 (six) hours as needed for mild pain, fever or headache (or Fever >/= 101). 07/10/19  Yes Emokpae, Courage, MD  albuterol (VENTOLIN HFA) 108 (90 Base) MCG/ACT inhaler Inhale 2 puffs into the lungs every 6 (six) hours as needed. 10/24/21  Yes [provider]  aspirin EC 81 MG tablet Take 1 tablet (81 mg total) by mouth daily with breakfast. 07/10/19  Yes Emokpae, Courage, MD  Calcium Carbonate (CALCIUM-CARB 600 PO) Take 600 mg by mouth daily.   Yes [provider]  cholecalciferol (VITAMIN D) 1000 units tablet Take  1,000 Units by mouth daily.   Yes [provider]  ferrous sulfate 325 (65 FE) MG tablet Take 325 mg by mouth daily with breakfast.   Yes [provider]  gabapentin (NEURONTIN) 300 MG capsule Take 300 mg by mouth 3 (three) times daily.   Yes [provider]  Multiple Vitamin (MULTIVITAMIN WITH MINERALS) TABS tablet Take 1 tablet by mouth daily.   Yes [provider]  nystatin (MYCOSTATIN/NYSTOP) powder APPLY TO AFFECTED AREA TWICE A DAY   Yes [provider]  polyethylene glycol (MIRALAX MIX-IN PAX) 17 g packet Take 17 g by mouth daily as needed for mild constipation or moderate constipation. 07/10/19  Yes Emokpae, Courage, MD  pravastatin (PRAVACHOL) 20 MG tablet Take 20 mg by mouth daily.   Yes [provider]  torsemide (DEMADEX) 20 MG tablet Take 40 mg by mouth daily.   Yes [provider]  venlafaxine (EFFEXOR) 37.5 MG tablet Take 37.5 mg by mouth daily.   Yes [provider]  vitamin C (ASCORBIC ACID) 500 MG tablet Take 500 mg by mouth daily.   Yes [provider]  albuterol (PROVENTIL) (2.5 MG/3ML) 0.083% nebulizer solution Take 3 mLs (2.5 mg total) by nebulization every 6 (six) hours as needed for wheezing or shortness of breath. Patient not taking: Reported on 04/05/2022 08/28/19   Erick BlinksMemon, Jehanzeb, MD  metoCLOPramide (REGLAN) 10 MG tablet Take 1 tablet (10 mg total) by mouth every 6 (six) hours as needed for nausea (nausea/headache). Patient not taking: Reported on 04/05/2022 08/16/19   Mesner, Barbara CowerJason, MD  ondansetron (ZOFRAN ODT) 4 MG disintegrating tablet 4mg  ODT q4 hours prn nausea/vomit Patient not taking: Reported on 04/05/2022 08/13/19   Dartha LodgeFord, Kelsey N, PA-C  traMADol (ULTRAM) 50 MG tablet Take 1 tablet (50 mg total) by mouth every 6 (six) hours as needed for moderate pain. Patient not taking: Reported on 04/05/2022 08/28/19   Erick BlinksMemon, Jehanzeb, MD     Allergies:    No Known Allergies   Physical Exam:    Vitals  Blood pressure 126/79, pulse 96, temperature 98.1 F (36.7 C), temperature source Oral, resp. rate 20, height 5\' 6"  (1.676 m), weight 131.5 kg, SpO2 91 %.   1. General elderly female, with obesity, laying in bed, in no apparent distress  2. Normal affect and insight, Not Suicidal or Homicidal, Awake Alert, Oriented X 3.  3. No F.N deficits, ALL C.Nerves Intact, Strength 5/5 all 4 extremities, Sensation intact all 4 extremities, Plantars down going.  4. Ears and Eyes appear Normal, Conjunctivae clear, PERRLA. Moist Oral Mucosa.  5. Supple Neck, No JVD, No cervical lymphadenopathy appriciated, No Carotid Bruits.  6. Symmetrical Chest wall movement, decreased air entry at the bases, no wheezing  7. RRR, No Gallops, + Murmurs, No Parasternal Heave.  8. Positive Bowel Sounds, Abdomen Soft, No tenderness, No organomegaly appriciated,No rebound -guarding or rigidity.  9.  No Cyanosis, Normal Skin Turgor, lower extremity skin discoloration, chronic wounds which appears to be healing, lymphedema present.  10. Good muscle tone,  joints appear normal , no effusions, Normal ROM.     CBC Recent Labs  Lab 04/05/22 1051  WBC 8.0  HGB 13.4  HCT 43.3  PLT 298  MCV 95.8  MCH 29.6  MCHC 30.9  RDW 16.9*  LYMPHSABS 0.9  MONOABS 0.6  EOSABS 0.2  BASOSABS 0.0   ------------------------------------------------------------------------------------------------------------------  Chemistries  Recent Labs  Lab 04/05/22 1051  NA 140  K 3.4*  CL 96*  CO2 35*  GLUCOSE 116*  BUN 19  CREATININE 0.91  CALCIUM 9.1  MG 2.2  AST 17  ALT 13  ALKPHOS 64  BILITOT 0.5   ------------------------------------------------------------------------------------------------------------------ estimated creatinine clearance is 67.5 mL/min (by C-G formula based on SCr of 0.91  mg/dL). ------------------------------------------------------------------------------------------------------------------ No results for input(s): "TSH", "T4TOTAL", "T3FREE", "  THYROIDAB" in the last 72 hours.  Invalid input(s): "FREET3"  Coagulation profile Recent Labs  Lab 04/05/22 1051  INR 1.1   ------------------------------------------------------------------------------------------------------------------- Recent Labs    04/05/22 1051  DDIMER 1.04*   -------------------------------------------------------------------------------------------------------------------  Cardiac Enzymes No results for input(s): "CKMB", "TROPONINI", "MYOGLOBIN" in the last 168 hours.  Invalid input(s): "CK" ------------------------------------------------------------------------------------------------------------------    Component Value Date/Time   BNP 183.0 (H) 04/05/2022 1051     ---------------------------------------------------------------------------------------------------------------  Urinalysis    Component Value Date/Time   COLORURINE STRAW (A) 04/05/2022 1051   APPEARANCEUR CLEAR 04/05/2022 1051   LABSPEC 1.005 04/05/2022 1051   PHURINE 7.0 04/05/2022 1051   GLUCOSEU NEGATIVE 04/05/2022 1051   HGBUR NEGATIVE 04/05/2022 1051   BILIRUBINUR NEGATIVE 04/05/2022 1051   KETONESUR NEGATIVE 04/05/2022 1051   PROTEINUR NEGATIVE 04/05/2022 1051   NITRITE NEGATIVE 04/05/2022 1051   LEUKOCYTESUR NEGATIVE 04/05/2022 1051    ----------------------------------------------------------------------------------------------------------------   Imaging Results:    CT Angio Chest PE W and/or Wo Contrast  Result Date: 04/05/2022 CLINICAL DATA:  Shortness of breath. EXAM: CT ANGIOGRAPHY CHEST WITH CONTRAST TECHNIQUE: Multidetector CT imaging of the chest was performed using the standard protocol during bolus administration of intravenous contrast. Multiplanar CT image reconstructions and  MIPs were obtained to evaluate the vascular anatomy. RADIATION DOSE REDUCTION: This exam was performed according to the departmental dose-optimization program which includes automated exposure control, adjustment of the mA and/or kV according to patient size and/or use of iterative reconstruction technique. CONTRAST:  21mL OMNIPAQUE IOHEXOL 350 MG/ML SOLN COMPARISON:  August 23, 2019. FINDINGS: Cardiovascular: Satisfactory opacification of the pulmonary arteries to the segmental level. No evidence of pulmonary embolism. Moderate cardiomegaly. 4 cm ascending thoracic aortic aneurysm. Status post transcatheter aortic valve repair. No pericardial effusion. Mild coronary calcifications are noted. Mediastinum/Nodes: No enlarged mediastinal, hilar, or axillary lymph nodes. Thyroid gland, trachea, and esophagus demonstrate no significant findings. Lungs/Pleura: No pneumothorax or pleural effusion is noted. Right middle lobe subsegmental atelectasis is noted. Mosaic pattern is noted throughout both lungs suggesting either air trapping secondary to small airways disease or multifocal inflammation. Upper Abdomen: No acute abnormality. Musculoskeletal: No chest wall abnormality. No acute or significant osseous findings. Review of the MIP images confirms the above findings. IMPRESSION: No definite evidence of pulmonary embolus. 4 cm ascending thoracic aortic aneurysm. Recommend annual imaging followup by CTA or MRA. This recommendation follows 2010 ACCF/AHA/AATS/ACR/ASA/SCA/SCAI/SIR/STS/SVM Guidelines for the Diagnosis and Management of Patients with Thoracic Aortic Disease. Circulation. 2010; 121: M426-S341. Aortic aneurysm NOS (ICD10-I71.9). Moderate cardiomegaly. Right middle lobe subsegmental atelectasis. Mosaic pattern is noted throughout both lungs suggesting either air trapping secondary to small airways disease or multifocal inflammation. Electronically Signed   By: Lupita Raider M.D.   On: 04/05/2022 14:50   DG  Chest Port 1 View  Result Date: 04/05/2022 CLINICAL DATA:  Shortness of breath.  Recent pneumonia. EXAM: PORTABLE CHEST 1 VIEW COMPARISON:  08/26/2019 FINDINGS: There is a left chest wall pacer device with leads in the right atrial appendage and right ventricle. Status post TAVR. Stable cardiac enlargement. Lung volumes are low. Asymmetric opacification at the left base is noted which may reflect atelectasis, airspace disease and/or left pleural effusion. Atelectasis is identified within the right midlung and right base. IMPRESSION: 1. Low lung volumes with asymmetric opacification at the left base may reflect atelectasis, airspace disease and/or left pleural effusion. 2. Right midlung and right base atelectasis. Electronically Signed   By: Signa Kell M.D.   On: 04/05/2022 11:13  My personal review of EKG: Rhythm NSR, Rate  99 /min, QTc 487 , LPFB, and old RBBB.   Assessment & Plan:    Principal Problem:   Acute respiratory failure with hypoxia (HCC) Active Problems:   Severe aortic stenosis   Obesity, Class III, BMI 40-49.9 (morbid obesity) (HCC)   Sleep apnea   Hypertension   Hyperlipemia   Chronic diastolic CHF (congestive heart failure), NYHA class 2 (HCC)  Acute respiratory failure with hypoxia Acute on chronic diastolic CHF Atelectasis -At baseline she is with no oxygen requirement, currently requiring 2 L nasal cannula. -Peers to be multifactorial, some evidence of volume overload on imaging, BNP is elevated as well, so she will be started on 40 mg twice daily, recent 2D echo done with a preserved EF, grade 1 diastolic dysfunction. -As of evidence of multifocal inflammation/small airway disease on imaging, she will be started on azithromycin and steroids, will keep on scheduled DuoNebs as well. -Has some atelectasis on imaging, will provide incentive spirometer and encouraged to use -[place] she will need home oxygen on discharge. -She is with diagnosis of mild sleep apnea, but  daughter reports study was done 2 years ago, only mild disease, reports there was no indication for CPAP, but she is high risk OHS, of pCO2 is elevated, then will need referral to pulmonary for OHS evaluation. -CTA chest negative for PE  Hypokalemia - repleted  Obsesity Body mass index is 46.81 kg/m.  Lymphedema/chronic venous stasis -she  is on wound care at home, to continue wound care upon discharge  History of chronic heart block -She has a pacemaker  Severe aortic stenosis -Status post-TAVR March 2020  Hyperlipidemia -Continue with Pravachol   DVT Prophylaxis  Lovenox  AM Labs Ordered, also please review Full Orders  Family Communication: Admission, patients condition and plan of care including tests being ordered have been discussed with the patient and daughter* who indicate understanding and agree with the plan and Code Status.  Code Status DNR  Likely DC to  home  Condition GUARDED    Consults called: none    Admission status: observation    Time spent in minutes : 70 minutes   Huey Bienenstock M.D on 04/05/2022 at 5:48 PM   [facility] - Office  [phone]

## 2022-04-05 NOTE — ED Provider Notes (Signed)
[facility] EMERGENCY DEPARTMENT Provider Note   CSN: [id number] Arrival date & time: 04/05/22  1033     History  Chief Complaint  Patient presents with   Shortness of Breath    Dawn Gonzales is a 81 y.o. female with a history significant for CHF, history of endocarditis, history of CVA, hypertension, severe venous insufficiency of her lower extremities, prior history of aortic stenosis with surgical repair, had pneumonia several months ago, was mostly improved from this infection but daughter at bedside states that she had continued to have some coughing and mild shortness of breath, over the past 2 weeks they have been monitoring her home oxygen with an apple device and has been having episodes where her oxygen drops to the mid 80s.  At rest she essentially has no complaints, but with any exertion she becomes significantly short of breath.  She was being transferred from her wheelchair to the car this morning for an outpatient chest x-ray when she became severely short of breath and hypoxic, was in the mid 70s when EMS arrived.  She was found to be wheezing and was given a neb treatment prior to arrival.  She was also placed on 3 L nasal cannula and her oxygen increased to 93%.  She is currently on 2 L nasal cannula and she states her breathing is comfortable.  When her oxygen was discontinued here, at rest, again dropped to 86%.  She denies chest pain, daughter at the bedside states her peripheral edema is better than normal.  She has been compliant with her Lasix.  Denies fevers, cough, chest pain.  The history is provided by the patient and a relative (daughter Huntley Dec and home cna at bedside).       Home Medications Prior to Admission medications   Medication Sig Start Date End Date Taking? Authorizing Provider  acetaminophen (TYLENOL) 325 MG tablet Take 2 tablets (650 mg total) by mouth every 6 (six) hours as needed for mild pain, fever or headache (or Fever >/= 101). 07/10/19   Yes Emokpae, Courage, MD  albuterol (VENTOLIN HFA) 108 (90 Base) MCG/ACT inhaler Inhale 2 puffs into the lungs every 6 (six) hours as needed. 10/24/21  Yes [provider]  aspirin EC 81 MG tablet Take 1 tablet (81 mg total) by mouth daily with breakfast. 07/10/19  Yes Emokpae, Courage, MD  Calcium Carbonate (CALCIUM-CARB 600 PO) Take 600 mg by mouth daily.   Yes [provider]  cholecalciferol (VITAMIN D) 1000 units tablet Take 1,000 Units by mouth daily.   Yes [provider]  ferrous sulfate 325 (65 FE) MG tablet Take 325 mg by mouth daily with breakfast.   Yes [provider]  gabapentin (NEURONTIN) 300 MG capsule Take 300 mg by mouth 3 (three) times daily.   Yes [provider]  Multiple Vitamin (MULTIVITAMIN WITH MINERALS) TABS tablet Take 1 tablet by mouth daily.   Yes [provider]  nystatin (MYCOSTATIN/NYSTOP) powder APPLY TO AFFECTED AREA TWICE A DAY   Yes [provider]  polyethylene glycol (MIRALAX MIX-IN PAX) 17 g packet Take 17 g by mouth daily as needed for mild constipation or moderate constipation. 07/10/19  Yes Emokpae, Courage, MD  pravastatin (PRAVACHOL) 20 MG tablet Take 20 mg by mouth daily.   Yes [provider]  torsemide (DEMADEX) 20 MG tablet Take 40 mg by mouth daily.   Yes [provider]  venlafaxine (EFFEXOR) 37.5 MG tablet Take 37.5 mg by mouth daily.  Yes [provider]  vitamin C (ASCORBIC ACID) 500 MG tablet Take 500 mg by mouth daily.   Yes [provider]  albuterol (PROVENTIL) (2.5 MG/3ML) 0.083% nebulizer solution Take 3 mLs (2.5 mg total) by nebulization every 6 (six) hours as needed for wheezing or shortness of breath. Patient not taking: Reported on 04/05/2022 08/28/19   Erick Blinks, MD  metoCLOPramide (REGLAN) 10 MG tablet Take 1 tablet (10 mg total) by mouth every 6 (six) hours as needed for nausea (nausea/headache). Patient not taking: Reported on  04/05/2022 08/16/19   Mesner, Barbara Cower, MD  ondansetron (ZOFRAN ODT) 4 MG disintegrating tablet  ODT q4 hours prn nausea/vomit Patient not taking: Reported on 04/05/2022 08/13/19   Dartha Lodge, PA-C  traMADol (ULTRAM) 50 MG tablet Take 1 tablet (50 mg total) by mouth every 6 (six) hours as needed for moderate pain. Patient not taking: Reported on 04/05/2022 08/28/19   Erick Blinks, MD      Allergies    Patient has no known allergies.    Review of Systems   Review of Systems  Constitutional:  Negative for chills and fever.  HENT:  Negative for congestion and sore throat.   Eyes: Negative.   Respiratory:  Positive for shortness of breath and wheezing. Negative for chest tightness.   Cardiovascular:  Positive for leg swelling. Negative for chest pain and palpitations.  Gastrointestinal:  Negative for abdominal pain and nausea.  Genitourinary: Negative.   Musculoskeletal:  Negative for arthralgias, joint swelling and neck pain.  Skin: Negative.  Negative for rash and wound.  Neurological:  Positive for weakness. Negative for dizziness, light-headedness, numbness and headaches.  Psychiatric/Behavioral: Negative.    All other systems reviewed and are negative.   Physical Exam Updated Vital Signs BP (!) 136/118   Pulse (!) 101   Temp 98.6 F (37 C) (Oral)   Resp (!) 22   Ht  (1.676 m)   Wt 131.5 kg   SpO2 93%   BMI 46.81 kg/m  Physical Exam Vitals and nursing note reviewed.  Constitutional:      Appearance: She is well-developed.     Comments: Chronically ill-appearing, no acute distress on 2 L nasal cannula.  She is borderline tachycardic at 100-1 01.  HENT:     Head: Normocephalic and atraumatic.  Eyes:     Conjunctiva/sclera: Conjunctivae normal.  Cardiovascular:     Rate and Rhythm: Regular rhythm. Tachycardia present.     Heart sounds: Normal heart sounds.     Comments: End systolic click.  Lower extremity dermatitis bilaterally c/w venous stasis.  Pulmonary:      Effort: Pulmonary effort is normal.     Breath sounds: Normal breath sounds. No wheezing.  Abdominal:     General: Bowel sounds are normal.     Palpations: Abdomen is soft.     Tenderness: There is no abdominal tenderness.  Musculoskeletal:        General: Normal range of motion.     Cervical back: Normal range of motion.     Right lower leg: 2+ Pitting Edema present.     Left lower leg: 2+ Pitting Edema present.  Skin:    General: Skin is warm and dry.  Neurological:     Mental Status: She is alert.     ED Results / Procedures / Treatments   Labs (all labs ordered are listed, but only abnormal results are displayed) Labs Reviewed  COMPREHENSIVE METABOLIC PANEL - Abnormal; Notable for the following  components:      Result Value   Potassium 3.4 (*)    Chloride 96 (*)    CO2 35 (*)    Glucose, Bld 116 (*)    All other components within normal limits  CBC WITH DIFFERENTIAL/PLATELET - Abnormal; Notable for the following components:   RDW 16.9 (*)    All other components within normal limits  URINALYSIS, ROUTINE W REFLEX MICROSCOPIC - Abnormal; Notable for the following components:   Color, Urine STRAW (*)    All other components within normal limits  BRAIN NATRIURETIC PEPTIDE - Abnormal; Notable for the following components:   B Natriuretic Peptide 183.0 (*)    All other components within normal limits  D-DIMER, QUANTITATIVE - Abnormal; Notable for the following components:   D-Dimer, Quant 1.04 (*)    All other components within normal limits  TROPONIN I (HIGH SENSITIVITY) - Abnormal; Notable for the following components:   Troponin I (High Sensitivity) 26 (*)    All other components within normal limits  TROPONIN I (HIGH SENSITIVITY) - Abnormal; Notable for the following components:   Troponin I (High Sensitivity) 27 (*)    All other components within normal limits  CULTURE, BLOOD (ROUTINE X 2)  CULTURE, BLOOD (ROUTINE X 2)  RESP PANEL BY RT-PCR (FLU A&B, COVID) [id number]   LACTIC ACID, PLASMA  LACTIC ACID, PLASMA  PROTIME-INR  MAGNESIUM    EKG EKG Interpretation  Date/Time:  Wednesday April 05 2022 10:49:33 EST Ventricular Rate:  102 PR Interval:  148 QRS Duration: 172 QT Interval:  392 QTC Calculation: 511 R Axis:   40 Text Interpretation: Sinus tachycardia Right bundle branch block Borderline ST depression, lateral leads Confirmed by Gloris Manchester [phone]) on 04/05/2022 10:56:29 AM  Radiology CT Angio Chest PE W and/or Wo Contrast  Result Date: 04/05/2022 CLINICAL DATA:  Shortness of breath. EXAM: CT ANGIOGRAPHY CHEST WITH CONTRAST TECHNIQUE: Multidetector CT imaging of the chest was performed using the standard protocol during bolus administration of intravenous contrast. Multiplanar CT image reconstructions and MIPs were obtained to evaluate the vascular anatomy. RADIATION DOSE REDUCTION: This exam was performed according to the departmental dose-optimization program which includes automated exposure control, adjustment of the mA and/or kV according to patient size and/or use of iterative reconstruction technique. CONTRAST:  7mL OMNIPAQUE IOHEXOL 350 MG/ML SOLN COMPARISON:  August 23, 2019. FINDINGS: Cardiovascular: Satisfactory opacification of the pulmonary arteries to the segmental level. No evidence of pulmonary embolism. Moderate cardiomegaly. 4 cm ascending thoracic aortic aneurysm. Status post transcatheter aortic valve repair. No pericardial effusion. Mild coronary calcifications are noted. Mediastinum/Nodes: No enlarged mediastinal, hilar, or axillary lymph nodes. Thyroid gland, trachea, and esophagus demonstrate no significant findings. Lungs/Pleura: No pneumothorax or pleural effusion is noted. Right middle lobe subsegmental atelectasis is noted. Mosaic pattern is noted throughout both lungs suggesting either air trapping secondary to small airways disease or multifocal inflammation. Upper Abdomen: No acute abnormality. Musculoskeletal: No chest wall  abnormality. No acute or significant osseous findings. Review of the MIP images confirms the above findings. IMPRESSION: No definite evidence of pulmonary embolus. 4 cm ascending thoracic aortic aneurysm. Recommend annual imaging followup by CTA or MRA. This recommendation follows 2010 ACCF/AHA/AATS/ACR/ASA/SCA/SCAI/SIR/STS/SVM Guidelines for the Diagnosis and Management of Patients with Thoracic Aortic Disease. Circulation. 2010; 121: N235-T732. Aortic aneurysm NOS (ICD10-I71.9). Moderate cardiomegaly. Right middle lobe subsegmental atelectasis. Mosaic pattern is noted throughout both lungs suggesting either air trapping secondary to small airways disease or multifocal inflammation. Electronically Signed   By: Roque Lias  Jr M.D.   On: 04/05/2022 14:50   DG Chest Port 1 View  Result Date: 04/05/2022 CLINICAL DATA:  Shortness of breath.  Recent pneumonia. EXAM: PORTABLE CHEST 1 VIEW COMPARISON:  08/26/2019 FINDINGS: There is a left chest wall pacer device with leads in the right atrial appendage and right ventricle. Status post TAVR. Stable cardiac enlargement. Lung volumes are low. Asymmetric opacification at the left base is noted which may reflect atelectasis, airspace disease and/or left pleural effusion. Atelectasis is identified within the right midlung and right base. IMPRESSION: 1. Low lung volumes with asymmetric opacification at the left base may reflect atelectasis, airspace disease and/or left pleural effusion. 2. Right midlung and right base atelectasis. Electronically Signed   By: Signa Kell M.D.   On: 04/05/2022 11:13    Procedures Procedures    Medications Ordered in ED Medications  methylPREDNISolone sodium succinate (SOLU-MEDROL) 125 mg/2 mL injection 125 mg (has no administration in time range)  cefTRIAXone (ROCEPHIN) 1 g in sodium chloride 0.9 % 100 mL IVPB (has no administration in time range)  furosemide (LASIX) injection 40 mg (has no administration in time range)   potassium chloride SA (KLOR-CON M) CR tablet 20 mEq (has no administration in time range)  iohexol (OMNIPAQUE) 350 MG/ML injection 100 mL (80 mLs Intravenous Contrast Given 04/05/22 1420)    ED Course/ Medical Decision Making/ A&P                           Medical Decision Making Patient with a 2-week history of worsening shortness of breath, home oxygen saturations to the mid 80s intermittently, worse with exertion.  Today she became extremely hypoxic attempting to pivot from her wheelchair to the car for an outpatient chest x-ray, desatted to the mid 70s prior to arrival here.  She was wheezing and received a neb treatment prior to arrival, no significant wheezing currently.  She is comfortable with her breathing on 2 L of oxygen, she denies chest pain, no worsening unilateral peripheral edema, better per daughter at bedside but significantly edematous on my exam.  She has a new oxygen requirement of unclear etiology, may be multifactorial, although her BNP is slightly elevated at 183, there may be an overload component.  Her D-dimer was elevated therefore we proceeded to CT angio chest, she does not have a PE, but she does have was a trapping pattern suggesting multifocal inflammation.  She is given Lasix 40 mg here for diuresis, will also add Solu-Medrol.  Patient will require admission for further evaluation and improvement of her new oxygen requirement.  Amount and/or Complexity of Data Reviewed Labs: ordered.    Details: Per above.  Additionally significant findings is a minimally elevated troponin, initial troponin was 26, second is flat at 27.  Her D-dimer is elevated 1.04.  She does have a mild hypokalemia at 3.4, given she is being ordered some Lasix I have ordered 1 run of potassium.  Agnesian level is normal at 2.2.  BNP is 183. Radiology: ordered.    Details: CT imaging per above. Discussion of management or test interpretation with external provider(s): Call placed to the hospitalist  for admission.  Patient discussed with Dr. Randol Kern who accepts patient for admission.  Risk Prescription drug management. Decision regarding hospitalization.           Final Clinical Impression(s) / ED Diagnoses Final diagnoses:  Hypoxia  SOB (shortness of breath)    Rx / DC Orders ED  Discharge Orders     None         Victoriano Laindol, Ahmia Colford, PA-C 04/05/22 1550    Gloris Manchesterixon, Ryan, MD 04/08/22 1115

## 2022-04-05 NOTE — ED Triage Notes (Addendum)
"  Diagnoses with pneumonia this Summer. Has lingering shortness of breath. Started on albuterol in late November, Today was going to get a follow up chest x ray this morning and when she went to walk to the car she got very short of breath and called EMS, upon arrival she was having dyspnea, sats 77% on RA, not on oxygen at home, placed on oxygen at 3L and increased to 93%. Wheezing noted. Started a duoneb. Gave NS for being tachycardic and normotensive" Per EMS

## 2022-04-06 DIAGNOSIS — I110 Hypertensive heart disease with heart failure: Secondary | ICD-10-CM | POA: Diagnosis present

## 2022-04-06 DIAGNOSIS — G4730 Sleep apnea, unspecified: Secondary | ICD-10-CM | POA: Diagnosis present

## 2022-04-06 DIAGNOSIS — Z79899 Other long term (current) drug therapy: Secondary | ICD-10-CM | POA: Diagnosis not present

## 2022-04-06 DIAGNOSIS — Z1152 Encounter for screening for COVID-19: Secondary | ICD-10-CM | POA: Diagnosis not present

## 2022-04-06 DIAGNOSIS — E876 Hypokalemia: Secondary | ICD-10-CM | POA: Diagnosis present

## 2022-04-06 DIAGNOSIS — Z8261 Family history of arthritis: Secondary | ICD-10-CM | POA: Diagnosis not present

## 2022-04-06 DIAGNOSIS — I5033 Acute on chronic diastolic (congestive) heart failure: Secondary | ICD-10-CM | POA: Diagnosis present

## 2022-04-06 DIAGNOSIS — Z6841 Body Mass Index (BMI) 40.0 and over, adult: Secondary | ICD-10-CM | POA: Diagnosis not present

## 2022-04-06 DIAGNOSIS — Z66 Do not resuscitate: Secondary | ICD-10-CM | POA: Diagnosis present

## 2022-04-06 DIAGNOSIS — Z950 Presence of cardiac pacemaker: Secondary | ICD-10-CM | POA: Diagnosis not present

## 2022-04-06 DIAGNOSIS — J9601 Acute respiratory failure with hypoxia: Secondary | ICD-10-CM | POA: Diagnosis present

## 2022-04-06 DIAGNOSIS — Z87891 Personal history of nicotine dependence: Secondary | ICD-10-CM | POA: Diagnosis not present

## 2022-04-06 DIAGNOSIS — R0602 Shortness of breath: Secondary | ICD-10-CM | POA: Diagnosis present

## 2022-04-06 DIAGNOSIS — Z953 Presence of xenogenic heart valve: Secondary | ICD-10-CM | POA: Diagnosis not present

## 2022-04-06 DIAGNOSIS — L89319 Pressure ulcer of right buttock, unspecified stage: Secondary | ICD-10-CM | POA: Diagnosis present

## 2022-04-06 DIAGNOSIS — I890 Lymphedema, not elsewhere classified: Secondary | ICD-10-CM | POA: Diagnosis present

## 2022-04-06 DIAGNOSIS — Z8249 Family history of ischemic heart disease and other diseases of the circulatory system: Secondary | ICD-10-CM | POA: Diagnosis not present

## 2022-04-06 DIAGNOSIS — I878 Other specified disorders of veins: Secondary | ICD-10-CM | POA: Diagnosis present

## 2022-04-06 DIAGNOSIS — Z8673 Personal history of transient ischemic attack (TIA), and cerebral infarction without residual deficits: Secondary | ICD-10-CM | POA: Diagnosis not present

## 2022-04-06 DIAGNOSIS — I441 Atrioventricular block, second degree: Secondary | ICD-10-CM | POA: Diagnosis present

## 2022-04-06 DIAGNOSIS — Z809 Family history of malignant neoplasm, unspecified: Secondary | ICD-10-CM | POA: Diagnosis not present

## 2022-04-06 DIAGNOSIS — J9811 Atelectasis: Secondary | ICD-10-CM | POA: Diagnosis present

## 2022-04-06 DIAGNOSIS — Z7982 Long term (current) use of aspirin: Secondary | ICD-10-CM | POA: Diagnosis not present

## 2022-04-06 DIAGNOSIS — E785 Hyperlipidemia, unspecified: Secondary | ICD-10-CM | POA: Diagnosis present

## 2022-04-06 LAB — CBC
HCT: 41.6 % (ref 36.0–46.0)
Hemoglobin: 12.4 g/dL (ref 12.0–15.0)
MCH: 29.3 pg (ref 26.0–34.0)
MCHC: 29.8 g/dL — ABNORMAL LOW (ref 30.0–36.0)
MCV: 98.3 fL (ref 80.0–100.0)
Platelets: 269 10*3/uL (ref 150–400)
RBC: 4.23 MIL/uL (ref 3.87–5.11)
RDW: 16.6 % — ABNORMAL HIGH (ref 11.5–15.5)
WBC: 5.5 10*3/uL (ref 4.0–10.5)
nRBC: 0 % (ref 0.0–0.2)

## 2022-04-06 LAB — BASIC METABOLIC PANEL
Anion gap: 8 (ref 5–15)
BUN: 18 mg/dL (ref 8–23)
CO2: 35 mmol/L — ABNORMAL HIGH (ref 22–32)
Calcium: 8.5 mg/dL — ABNORMAL LOW (ref 8.9–10.3)
Chloride: 99 mmol/L (ref 98–111)
Creatinine, Ser: 0.88 mg/dL (ref 0.44–1.00)
GFR, Estimated: >60 mL/min (ref >60)
Glucose, Bld: 135 mg/dL — ABNORMAL HIGH (ref 70–99)
Potassium: 3.8 mmol/L (ref 3.5–5.1)
Sodium: 142 mmol/L (ref 135–145)

## 2022-04-06 LAB — BLOOD GAS, ARTERIAL
Acid-Base Excess: 15.1 mmol/L — ABNORMAL HIGH (ref 0.0–2.0)
Bicarbonate: 45.3 mmol/L — ABNORMAL HIGH (ref 20.0–28.0)
Drawn by: 1310
FIO2: 32 %
O2 Saturation: 90.7 %
Patient temperature: 36.4
pCO2 arterial: 84 mmHg (ref 32–48)
pH, Arterial: 7.34 — ABNORMAL LOW (ref 7.35–7.45)
pO2, Arterial: 61 mmHg — ABNORMAL LOW (ref 83–108)

## 2022-04-06 MED ORDER — IPRATROPIUM-ALBUTEROL 0.5-2.5 (3) MG/3ML IN SOLN
3.0000 mL | Freq: Two times a day (BID) | RESPIRATORY_TRACT | Status: DC
  Administered 2022-04-06 – 2022-04-07 (×2): 3 mL via RESPIRATORY_TRACT
  Filled 2022-04-06 (×3): qty 3

## 2022-04-06 MED ORDER — NYSTATIN 100000 UNIT/GM EX POWD
Freq: Two times a day (BID) | CUTANEOUS | Status: DC
  Filled 2022-04-06: qty 15

## 2022-04-06 NOTE — Progress Notes (Signed)
Rt back to check on patient on CPAP 10 with 3lpm cann spo2 82% change patient over to BIPAP 16/8 with 4lpm bleed in spo2 91%

## 2022-04-06 NOTE — Progress Notes (Signed)
ABG drawn and taken to lab 

## 2022-04-06 NOTE — TOC Initial Note (Addendum)
Transition of Care [facility]) - Initial/Assessment Note    Patient Details  Name: Dawn Gonzales MRN: [id number] Date of Birth: 1940-08-11  [facility] CM/SW Contact:    Karn Cassis, LCSW Phone Number: 04/06/2022, 12:27 PM  Clinical Narrative: Pt admitted with acute respiratory failure with hypoxia. Assessment completed with pt's daughter as unable to reach pt. Daughter reports pt lives with a roommate. She transfers independently. Discussed PT recommendation of SNF. Daughter indicates pt has been to SNF in the past and she feels pt will be agreeable. Requested [facility] or [facility] SNF. Will initiate bed search and authorization.  CHF screening completed. Daughter states pt weighs herself regularly. She follows a heart healthy diet and takes medications as prescribed.                  Update: Pt and pt's daughter agreeable to [facility] as well. Referral sent. Reviewed Medicare.gov ratings.   Expected Discharge Plan: Skilled Nursing Facility Barriers to Discharge: Continued Medical Work up   Patient Goals and CMS Choice Patient states their goals for this hospitalization and ongoing recovery are:: short term rehab   Choice offered to / list presented to : Adult Children  Expected Discharge Plan and Services Expected Discharge Plan: Skilled Nursing Facility In-house Referral: Clinical Social Work     Living arrangements for the past 2 months: Single Family Home                             HH Agency: [facility]        Prior Living Arrangements/Services Living arrangements for the past 2 months: Single Family Home Lives with:: Roommate Patient language and need for interpreter reviewed:: Yes Do you feel safe going back to the place where you live?: Yes          Current home services: DME (wheelchair, lift chair, ramp, walker) Criminal Activity/Legal Involvement Pertinent to Current Situation/Hospitalization: No - Comment as  needed  Activities of Daily Living Home Assistive Devices/Equipment: Bedside commode/3-in-1, Blood pressure cuff, Built-in shower seat, Eyeglasses, Grab bars in shower, Hand-held shower hose, Grab bars around toilet, Nebulizer, Scales, Shower chair without back, Environmental consultant (specify type), Wheelchair ADL Screening (condition at time of admission) Patient's cognitive ability adequate to safely complete daily activities?: Yes Is the patient deaf or have difficulty hearing?: No Does the patient have difficulty seeing, even when wearing glasses/contacts?: No Does the patient have difficulty concentrating, remembering, or making decisions?: No Patient able to express need for assistance with ADLs?: Yes Does the patient have difficulty dressing or bathing?: Yes Independently performs ADLs?: No Communication: Independent Dressing (OT): Needs assistance Is this a change from baseline?: Change from baseline, expected to last <3days Grooming: Needs assistance Is this a change from baseline?: Change from baseline, expected to last <3 days Feeding: Independent Bathing: Needs assistance Is this a change from baseline?: Change from baseline, expected to last <3 days Toileting: Needs assistance Is this a change from baseline?: Change from baseline, expected to last <3 days In/Out Bed: Needs assistance Is this a change from baseline?: Change from baseline, expected to last <3 days Walks in Home: Needs assistance Is this a change from baseline?: Change from baseline, expected to last <3 days Does the patient have difficulty walking or climbing stairs?: Yes Weakness of Legs: Both Weakness of Arms/Hands: Both  Permission Sought/Granted  Emotional Assessment         Alcohol / Substance Use: Not Applicable Psych Involvement: No (comment)  Admission diagnosis:  SOB (shortness of breath) [R06.02] Hypoxia [R09.02] Acute respiratory failure with hypoxia (HCC) [J96.01] Patient  Active Problem List   Diagnosis Date Noted   Acute respiratory failure with hypoxia ([place]) 04/05/2022   Small bowel obstruction ([place])    Hypoxia 08/20/2019   Enteritis 07/05/2019   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   Cholelithiasis 07/05/2019   Gallstone ileus ([place])    Kidney lesion, native, right 07/11/2018   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   Sleep apnea    Hypertension    Hyperlipemia    Chronic diastolic CHF (congestive heart failure), NYHA class 2 (HCC)    Neuropathy    Physical deconditioning    History of TIA (transient ischemic attack)    Right bundle branch block (RBBB)    Obesity, Class III, BMI 40-49.9 (morbid obesity) ([place])    Junctional bradycardia    Preoperative cardiovascular examination    Severe aortic stenosis    Choledocholithiasis    Epigastric pain 02/06/2018   PCP:  [name], MD Pharmacy:   [id number] - [place], [place] - [address] [place] [address] Phone: [phone] Fax: [phone]     Social Determinants of Health (SDOH) Interventions    Readmission Risk Interventions     No data to display

## 2022-04-06 NOTE — Progress Notes (Signed)
PROGRESS NOTE    Patient: Dawn Gonzales                            PCP: Benita Stabile, MD                    DOB: 03/28/1941            DOA: 04/05/2022 EXB:[id number]             DOS: 04/06/2022, 10:56 AM   LOS: 0 days   Date of Service: The patient was seen and examined on 04/06/2022  Subjective:   The patient was seen and examined this morning. Stable at this time. Still complaining shortness of breath, denies any chest pain  Brief Narrative:   Dawn Gonzales  is a 81 y.o. female, with past medical history of complete heart block, has pacemaker, 3 of severe aortic stenosis, status post TAVR, lymphedema, venous stasis, following with wound clinic and vascular surgery, hyperlipidemia, chronic diastolic CHF, endocarditis, history of CVA, hypertension,. -Patient ports cough, dyspnea over the last 2 weeks, she has pulse ox at home, for which she noted her oxygen saturation to be in the mid 80s, she denies any dyspnea at rest, but it is exacerbated by activity, was referred to have chest x-ray by her PCP, she was significantly dyspneic, her oxygen saturation noted to be in the mid 70s when she arrived to EMS, she had mild wheezing initially, but this has resolved after receiving IV steroids, patient denies fever, chills, chest pain, hemoptysis, or coffee-ground emesis, she reports chronic lower extremity lymphedema, appears to be stable at her baseline.  ED: Saturation was in the mid 70s, but this has improved with oxygen, she had wheezing which has improved with IV steroids, CTA chest negative for PE, but significant for airway inflammation, dyspnea much improved with steroids and nebulizer treatment, BNP was mildly elevated, some vascular congestion noted on imaging as well, so she received Lasix.  [facility] consulted to admit for new oxygen requirement.    Assessment & Plan:     Principal Problem:   Acute respiratory failure with hypoxia (HCC) Active Problems:    Severe aortic stenosis   Obesity, Class III, BMI 40-49.9 (morbid obesity) (HCC)   Sleep apnea   Hypertension   Hyperlipemia   Chronic diastolic CHF (congestive heart failure), NYHA class 2 (HCC)   Acute respiratory failure with hypoxia/ Acute on chronic diastolic CHF- HFpEF Atelectasis -Still complain of shortness of breath, but denies any chest pain -Continue IV diuretics Lasix 40 mg twice daily  -Recent echocardiogram reviewed preserved ejection fraction 55- 60%, grade 1 diastolic dysfunction noted -At baseline she is with no oxygen requirement, currently requiring 2 L nasal cannula. -Peers to be multifactorial: some evidence of volume overload on imaging, BNP is elevated  . -NUU:VOZDGUYQIH inflammation/small airway disease - started on azithromycin and scheduled DuoNebs  -Has some atelectasis on imaging, continue with incentive spirometer and encouraged to use -May  need home oxygen on discharge. -She is with diagnosis of mild sleep apnea, but daughter reports study was done 2 years ago, only mild disease, reports there was no indication for CPAP, but she is high risk OHS, of pCO2 is elevated, then will need referral to pulmonary for OHS evaluation. -CTA chest negative for PE  -Will monitor daily weight, I's and O's Filed Weights   04/05/22 1040 04/06/22 0112  Weight: 131.5 kg (!) 136.1 kg  Intake/Output Summary (Last 24 hours) at 04/06/2022 1055 Last data filed at 04/06/2022 0900 Gross per 24 hour  Intake 340.97 ml  Output 1100 ml  Net -759.03 ml    Will continue with diuresing today, will ambulate to evaluate patient for qualification of home O2    Hypokalemia - repleted   Obsesity Body mass index is 46.81 kg/m.   Lymphedema/chronic venous stasis -she is on wound care at home, to continue wound care upon discharge   History of chronic heart block -She has a pacemaker   Severe aortic stenosis -Status post-TAVR March 2020   Hyperlipidemia -Continue with  Pravachol    ----------------------------------------------------------------------------------------------------------------------------------------------- Nutritional status:  The patient's BMI is: Body mass index is 48.43 kg/m. I agree with the assessment and plan as outlined  Skin Assessment: I have examined the patient's skin and I agree with the wound assessment as performed by wound care team As outlined belowe: Pressure Injury 04/06/22 Buttocks Right  (Active)  04/06/22 0127  Location: Buttocks  Location Orientation: Right  Staging: -- (pinhole size open area)  Wound Description (Comments):   Present on Admission: Yes  Dressing Type Foam - Lift dressing to assess site every shift 04/06/22 0900     ------------------------------------------------------------------------------------------------------------------------------------------------  DVT prophylaxis:  enoxaparin (LOVENOX) injection 40 mg Start: 04/06/22 1000   Code Status:   Code Status: DNR  Family Communication: No family member present at bedside- attempt will be made to update daily The above findings and plan of care has been discussed with patient (and family)  in detail,  they expressed understanding and agreement of above. -Advance care planning has been discussed.   Admission status:   Status is: Observation The patient remains OBS appropriate and will d/c before 2 midnights.     Procedures:   No admission procedures for hospital encounter.   Antimicrobials:  Anti-infectives (From admission, onward)    Start     Dose/Rate Route Frequency Ordered Stop   04/05/22 1800  azithromycin (ZITHROMAX) tablet 500 mg        500 mg Oral Daily 04/05/22 1747     04/05/22 1530  cefTRIAXone (ROCEPHIN) 1 g in sodium chloride 0.9 % 100 mL IVPB        1 g 200 mL/hr over 30 Minutes Intravenous  Once 04/05/22 1520 04/05/22 1848        Medication:   aspirin EC  81 mg Oral Q breakfast   azithromycin  500  mg Oral Daily   cholecalciferol  1,000 Units Oral Daily   enoxaparin (LOVENOX) injection  40 mg Subcutaneous Q24H   furosemide  40 mg Intravenous BID   gabapentin  300 mg Oral TID   ipratropium-albuterol  3 mL Nebulization TID   methylPREDNISolone (SOLU-MEDROL) injection  80 mg Intravenous Daily   nystatin   Topical BID   pravastatin  20 mg Oral QHS   venlafaxine  37.5 mg Oral Daily    albuterol, polyethylene glycol   Objective:   Vitals:   04/06/22 0112 04/06/22 0502 04/06/22 0743 04/06/22 1007  BP: 124/60 (!) 103/51  127/60  Pulse: 81 100 97 96  Resp: 20 19 18    Temp: 98 F (36.7 C) (!) 97.5 F (36.4 C)  97.8 F (36.6 C)  TempSrc: Oral Oral  Oral  SpO2: 94% 91% 91% 92%  Weight:      Height:        Intake/Output Summary (Last 24 hours) at 04/06/2022 1056 Last data filed at 04/06/2022 0900 Gross per 24  hour  Intake 340.97 ml  Output 1100 ml  Net -759.03 ml   Filed Weights   04/05/22 1040 04/06/22 0112  Weight: 131.5 kg (!) 136.1 kg     Examination:   Physical Exam  Constitution:  Alert, cooperative, no distress,  Appears calm and comfortable  Psychiatric:   Normal and stable mood and affect, cognition intact,   HEENT:        Normocephalic, PERRL, otherwise with in Normal limits  Chest:         Chest symmetric Cardio vascular:  S1/S2, RRR, No murmure, No Rubs or Gallops  pulmonary: Clear to auscultation bilaterally, respirations unlabored, negative wheezes / crackles Abdomen: Soft, non-tender, non-distended, bowel sounds,no masses, no organomegaly Muscular skeletal: Limited exam - in bed, able to move all 4 extremities,   Neuro: CNII-XII intact. , normal motor and sensation, reflexes intact  Extremities: No pitting edema lower extremities, +2 pulses  Skin: Dry, warm to touch, negative for any Rashes, significant bilateral lower extremity venous stasis with erythema edema Wounds: per nursing documentation       ------------------------------------------------------------------------------------------------------------------------------------------    LABs:     Latest Ref Rng & Units 04/06/2022    5:39 AM 04/05/2022   10:51 AM 08/28/2019    5:34 AM  CBC  WBC 4.0 - 10.5 K/uL 5.5  8.0  11.2   Hemoglobin 12.0 - 15.0 g/dL 12.4  13.4  11.0   Hematocrit 36.0 - 46.0 % 41.6  43.3  36.0   Platelets 150 - 400 K/uL 269  298  234       Latest Ref Rng & Units 04/06/2022    5:39 AM 04/05/2022   10:51 AM 08/28/2019    5:34 AM  CMP  Glucose 70 - 99 mg/dL 135  116  99   BUN 8 - 23 mg/dL 18  19  31    Creatinine 0.44 - 1.00 mg/dL 0.88  0.91  0.75   Sodium 135 - 145 mmol/L 142  140  140   Potassium 3.5 - 5.1 mmol/L 3.8  3.4  3.5   Chloride 98 - 111 mmol/L 99  96  105   CO2 22 - 32 mmol/L 35  35  27   Calcium 8.9 - 10.3 mg/dL 8.5  9.1  7.8   Total Protein 6.5 - 8.1 g/dL  7.5    Total Bilirubin 0.3 - 1.2 mg/dL  0.5    Alkaline Phos 38 - 126 U/L  64    AST 15 - 41 U/L  17    ALT 0 - 44 U/L  13         Micro Results Recent Results (from the past 240 hour(s))  Culture, blood (Routine x 2)     Status: None (Preliminary result)   Collection Time: 04/05/22 10:51 AM   Specimen: BLOOD LEFT HAND  Result Value Ref Range Status   Specimen Description BLOOD LEFT HAND  Final   Special Requests   Final    BOTTLES DRAWN AEROBIC AND ANAEROBIC Blood Culture adequate volume   Culture   Final    NO GROWTH < 24 HOURS Performed at [facility], [address] [place], [place] [address]    Report Status PENDING  Incomplete  Culture, blood (Routine x 2)     Status: None (Preliminary result)   Collection Time: 04/05/22 10:59 AM   Specimen: Right Antecubital; Blood  Result Value Ref Range Status   Specimen Description RIGHT ANTECUBITAL  Final   Special Requests  Final    BOTTLES DRAWN AEROBIC AND ANAEROBIC Blood Culture adequate volume   Culture   Final    NO GROWTH < 24 HOURS Performed at [facility],  [address] [place], [place] [address]    Report Status PENDING  Incomplete  Resp Panel by RT-PCR (Flu A&B, Covid) Anterior Nasal Swab     Status: None   Collection Time: 04/05/22  4:55 PM   Specimen: Anterior Nasal Swab  Result Value Ref Range Status   SARS Coronavirus 2 by RT PCR NEGATIVE NEGATIVE Final    Comment: (NOTE) SARS-CoV-2 target nucleic acids are NOT DETECTED.  The SARS-CoV-2 RNA is generally detectable in upper respiratory specimens during the acute phase of infection. The lowest concentration of SARS-CoV-2 viral copies this assay can detect is 138 copies/mL. A negative result does not preclude SARS-Cov-2 infection and should not be used as the sole basis for treatment or other patient management decisions. A negative result may occur with  improper specimen collection/handling, submission of specimen other than nasopharyngeal swab, presence of viral mutation(s) within the areas targeted by this assay, and inadequate number of viral copies(<138 copies/mL). A negative result must be combined with clinical observations, patient history, and epidemiological information. The expected result is Negative.  Fact Sheet for Patients:  EntrepreneurPulse.com.au  Fact Sheet for Healthcare Providers:  IncredibleEmployment.be  This test is no t yet approved or cleared by the [place] FDA and  has been authorized for detection and/or diagnosis of SARS-CoV-2 by FDA under an Emergency Use Authorization (EUA). This EUA will remain  in effect (meaning this test can be used) for the duration of the COVID-19 declaration under Section 564(b)(1) of the Act, 21 U.S.C.section 360bbb-3(b)(1), unless the authorization is terminated  or revoked sooner.       Influenza A by PCR NEGATIVE NEGATIVE Final   Influenza B by PCR NEGATIVE NEGATIVE Final    Comment: (NOTE) The Xpert Xpress SARS-CoV-2/FLU/RSV plus assay is intended as an aid in the diagnosis of  influenza from Nasopharyngeal swab specimens and should not be used as a sole basis for treatment. Nasal washings and aspirates are unacceptable for Xpert Xpress SARS-CoV-2/FLU/RSV testing.  Fact Sheet for Patients: EntrepreneurPulse.com.au  Fact Sheet for Healthcare Providers: IncredibleEmployment.be  This test is not yet approved or cleared by the [place] FDA and has been authorized for detection and/or diagnosis of SARS-CoV-2 by FDA under an Emergency Use Authorization (EUA). This EUA will remain in effect (meaning this test can be used) for the duration of the COVID-19 declaration under Section 564(b)(1) of the Act, 21 U.S.C. section 360bbb-3(b)(1), unless the authorization is terminated or revoked.  Performed at [facility], [address], [place], [place] [address]     Radiology Reports CT Angio Chest PE W and/or Wo Contrast  Result Date: 04/05/2022 CLINICAL DATA:  Shortness of breath. EXAM: CT ANGIOGRAPHY CHEST WITH CONTRAST TECHNIQUE: Multidetector CT imaging of the chest was performed using the standard protocol during bolus administration of intravenous contrast. Multiplanar CT image reconstructions and MIPs were obtained to evaluate the vascular anatomy. RADIATION DOSE REDUCTION: This exam was performed according to the departmental dose-optimization program which includes automated exposure control, adjustment of the mA and/or kV according to patient size and/or use of iterative reconstruction technique. CONTRAST:  7mL OMNIPAQUE IOHEXOL 350 MG/ML SOLN COMPARISON:  August 23, 2019. FINDINGS: Cardiovascular: Satisfactory opacification of the pulmonary arteries to the segmental level. No evidence of pulmonary embolism. Moderate cardiomegaly. 4 cm ascending thoracic aortic aneurysm.  Status post transcatheter aortic valve repair. No pericardial effusion. Mild coronary calcifications are noted. Mediastinum/Nodes: No enlarged mediastinal,  hilar, or axillary lymph nodes. Thyroid gland, trachea, and esophagus demonstrate no significant findings. Lungs/Pleura: No pneumothorax or pleural effusion is noted. Right middle lobe subsegmental atelectasis is noted. Mosaic pattern is noted throughout both lungs suggesting either air trapping secondary to small airways disease or multifocal inflammation. Upper Abdomen: No acute abnormality. Musculoskeletal: No chest wall abnormality. No acute or significant osseous findings. Review of the MIP images confirms the above findings. IMPRESSION: No definite evidence of pulmonary embolus. 4 cm ascending thoracic aortic aneurysm. Recommend annual imaging followup by CTA or MRA. This recommendation follows 2010 ACCF/AHA/AATS/ACR/ASA/SCA/SCAI/SIR/STS/SVM Guidelines for the Diagnosis and Management of Patients with Thoracic Aortic Disease. Circulation. 2010; [id number]. Aortic aneurysm NOS (ICD10-I71.9). Moderate cardiomegaly. Right middle lobe subsegmental atelectasis. Mosaic pattern is noted throughout both lungs suggesting either air trapping secondary to small airways disease or multifocal inflammation. Electronically Signed   By: Marijo Conception M.D.   On: 04/05/2022 14:50   DG Chest Port 1 View  Result Date: 04/05/2022 CLINICAL DATA:  Shortness of breath.  Recent pneumonia. EXAM: PORTABLE CHEST 1 VIEW COMPARISON:  08/26/2019 FINDINGS: There is a left chest wall pacer device with leads in the right atrial appendage and right ventricle. Status post TAVR. Stable cardiac enlargement. Lung volumes are low. Asymmetric opacification at the left base is noted which may reflect atelectasis, airspace disease and/or left pleural effusion. Atelectasis is identified within the right midlung and right base. IMPRESSION: 1. Low lung volumes with asymmetric opacification at the left base may reflect atelectasis, airspace disease and/or left pleural effusion. 2. Right midlung and right base atelectasis. Electronically Signed    By: Kerby Moors M.D.   On: 04/05/2022 11:13    SIGNED: Deatra James, MD, FHM. [facility],  Pager (please use [email or web address] to page/text) Please use Epic Secure Chat for non-urgent communication (7AM-7PM)  If 7PM-7AM, please contact night-coverage [email or web address], 04/06/2022, 10:56 AM

## 2022-04-06 NOTE — Plan of Care (Signed)
  Problem: Acute Rehab OT Goals (only OT should resolve) Goal: Pt. Will Perform Grooming Flowsheets (Taken 04/06/2022 0950) Pt Will Perform Grooming:  with modified independence  sitting Goal: Pt. Will Perform Lower Body Bathing Flowsheets (Taken 04/06/2022 0950) Pt Will Perform Lower Body Bathing:  with modified independence  sitting/lateral leans Goal: Pt. Will Perform Lower Body Dressing Flowsheets (Taken 04/06/2022 0950) Pt Will Perform Lower Body Dressing:  with modified independence  sitting/lateral leans  with adaptive equipment Goal: Pt. Will Transfer To Toilet Flowsheets (Taken 04/06/2022 0950) Pt Will Transfer to Toilet:  with modified independence  stand pivot transfer Goal: Pt. Will Perform Toileting-Clothing Manipulation Flowsheets (Taken 04/06/2022 0950) Pt Will Perform Toileting - Clothing Manipulation and hygiene:  with modified independence  sitting/lateral leans Goal: Pt/Caregiver Will Perform Home Exercise Program Flowsheets (Taken 04/06/2022 [phone]) Pt/caregiver will Perform Home Exercise Program:  Increased strength  Both right and left upper extremity  Independently  Breean Nannini OT, MOT

## 2022-04-06 NOTE — [place] FL2 (Signed)
[place] MEDICAID FL2 LEVEL OF CARE FORM     IDENTIFICATION  Patient Name: Dawn Gonzales Birthdate: [birth year] Sex: female Admission Date (Current Location): 04/05/2022  [facility] and [place] Number:  [employer] and Address:  [facility],  [address], [place] [address]      Provider Number: [id number]  Attending Physician Name and Address:  Kendell Bane, MD  Relative Name and Phone Number:       Current Level of Care: Hospital Recommended Level of Care: Skilled Nursing Facility Prior Approval Number:    Date Approved/Denied:   PASRR Number: [id number]  Discharge Plan: SNF    Current Diagnoses: Patient Active Problem List   Diagnosis Date Noted   Acute respiratory failure with hypoxia (HCC) 04/05/2022   Small bowel obstruction (HCC)    Hypoxia 08/20/2019   Enteritis 07/05/2019   Complete heart block (HCC) 07/05/2019   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   Cholelithiasis 07/05/2019   Gallstone ileus (HCC)    Kidney lesion, native, right 07/11/2018   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   Sleep apnea    Hypertension    Hyperlipemia    Chronic diastolic CHF (congestive heart failure), NYHA class 2 (HCC)    Neuropathy    Physical deconditioning    History of TIA (transient ischemic attack)    Right bundle branch block (RBBB)    Obesity, Class III, BMI 40-49.9 (morbid obesity) (HCC)    Junctional bradycardia    Preoperative cardiovascular examination    Severe aortic stenosis    Choledocholithiasis    Epigastric pain 02/06/2018    Orientation RESPIRATION BLADDER Height & Weight     Self, Time, Situation, Place  O2 (3L) Incontinent Weight: (!) 300 lb 0.7 oz (136.1 kg) Height:  5\' 6"  (167.6 cm)  BEHAVIORAL SYMPTOMS/MOOD NEUROLOGICAL BOWEL NUTRITION STATUS      Incontinent Diet (Heart healthy. See d/c summary for updates)  AMBULATORY STATUS COMMUNICATION OF NEEDS Skin   Extensive Assist  Verbally Bruising, Other (Comment) (pressure injury right buttocks with foam dressing, venous stasis ulcer left pretibial distal, irritant dermatitis bilateral breasts)                       Personal Care Assistance Level of Assistance  Bathing, Feeding, Dressing Bathing Assistance: Maximum assistance Feeding assistance: Limited assistance Dressing Assistance: Maximum assistance     Functional Limitations Info  Sight, Hearing, Speech Sight Info: Impaired Hearing Info: Adequate Speech Info: Adequate    SPECIAL CARE FACTORS FREQUENCY  PT (By licensed PT)     PT Frequency: 5x weekly              Contractures      Additional Factors Info  Code Status, Allergies, Psychotropic Code Status Info: DNR Allergies Info: No known allergies Psychotropic Info: Effexor         Current Medications (04/06/2022):  This is the current hospital active medication list Current Facility-Administered Medications  Medication Dose Route Frequency Provider Last Rate Last Admin   albuterol (PROVENTIL) (2.5 MG/3ML) 0.083% nebulizer solution 2.5 mg  2.5 mg Nebulization Q4H PRN Elgergawy, 14/10/2021, MD       aspirin EC tablet 81 mg  81 mg Oral Q breakfast Elgergawy, Leana Roe, MD   81 mg at 04/06/22 0814   azithromycin (ZITHROMAX) tablet 500 mg  500 mg Oral Daily Elgergawy, 14/07/23, MD   500 mg at 04/06/22 14/07/23   cholecalciferol (VITAMIN D3)  25 MCG (1000 UNIT) tablet 1,000 Units  1,000 Units Oral Daily Elgergawy, Leana Roe, MD   1,000 Units at 04/06/22 0929   enoxaparin (LOVENOX) injection 40 mg  40 mg Subcutaneous Q24H Elgergawy, Leana Roe, MD   40 mg at 04/06/22 0930   furosemide (LASIX) injection 40 mg  40 mg Intravenous BID Elgergawy, Leana Roe, MD   40 mg at 04/06/22 0816   gabapentin (NEURONTIN) capsule 300 mg  300 mg Oral TID Elgergawy, Leana Roe, MD   300 mg at 04/06/22 0929   ipratropium-albuterol (DUONEB) 0.5-2.5 (3) MG/3ML nebulizer solution 3 mL  3 mL Nebulization BID Shahmehdi, Seyed A,  MD       methylPREDNISolone sodium succinate (SOLU-MEDROL) 125 mg/2 mL injection 80 mg  80 mg Intravenous Daily Elgergawy, Leana Roe, MD   80 mg at 04/06/22 0930   nystatin (MYCOSTATIN/NYSTOP) topical powder   Topical BID Zierle-Ghosh, Asia B, DO   Given at 04/06/22 0931   polyethylene glycol (MIRALAX / GLYCOLAX) packet 17 g  17 g Oral Daily PRN Elgergawy, Leana Roe, MD   17 g at 04/06/22 0814   pravastatin (PRAVACHOL) tablet 20 mg  20 mg Oral QHS Elgergawy, Leana Roe, MD   20 mg at 04/05/22 2217   venlafaxine (EFFEXOR) tablet 37.5 mg  37.5 mg Oral Daily Elgergawy, Leana Roe, MD   37.5 mg at 04/06/22 3709     Discharge Medications: Please see discharge summary for a list of discharge medications.  Relevant Imaging Results:  Relevant Lab Results:   Additional Information SSN: [id number]  Karn Cassis, LCSW

## 2022-04-06 NOTE — Progress Notes (Signed)
Patient reposition and treatment given, spo2 85% on 3lpm cann patient place on CPAP of 10 with 3lpm cann bleed in spo2 90% RT will continue to monitor and make adjustment. Patient tolerating very well for first time

## 2022-04-06 NOTE — Evaluation (Signed)
Physical Therapy Evaluation Patient Details Name: Dawn Gonzales MRN: [id number] DOB: [birth year] Today's Date: 04/06/2022  History of Present Illness  Dawn Gonzales  is a 81 y.o. female, with past medical history of complete heart block, has pacemaker, 3 of severe aortic stenosis, status post TAVR, lymphedema, venous stasis, following with wound clinic and vascular surgery, hyperlipidemia, chronic diastolic CHF, endocarditis, history of CVA, hypertension,.  -Patient ports cough, dyspnea over the last 2 weeks, she has pulse ox at home, for which she noted her oxygen saturation to be in the mid 80s, she denies any dyspnea at rest, but it is exacerbated by activity, was referred to have chest x-ray by her PCP, she was significantly dyspneic, her oxygen saturation noted to be in the mid 70s when she arrived to EMS, she had mild wheezing initially, but this has resolved after receiving IV steroids, patient denies fever, chills, chest pain, hemoptysis, or coffee-ground emesis, she reports chronic lower extremity lymphedema, appears to be stable at her baseline.  - in ED usual saturation was in the mid 70s, but this has improved with oxygen, she had wheezing which has improved with IV steroids, CTA chest negative for PE, but significant for airway inflammation, dyspnea much improved with steroids and nebulizer treatment, BNP was mildly elevated, some vascular congestion noted on imaging as well, so she received Lasix, [facility] hospitalist consulted to admit for new oxygen requirement. (Per MD)  Clinical Impression   Pt is a 80yo female presenting to [facility] due to reason stated in the HPI.   Pt was limited in today's Physical Therapy Evaluation and attempted to wean off oxygen, patient was on room air patient desaturated to 79%.  Patient was educated on pursed lip breathing, patient able to improve oxygen saturation to 88 to 89%.  Patient performed functional mobility assessment  on room air was placed back on 3 L after mobility.  Pt performed modified independent, with head of bed ~ 90 degrees (mimicking lift recliner) w/ bed mobility, mod assist x 1 with RW w/ STS from EOB and bed/chair transfer.  Patient reports being nonambulatory at baseline.  Patient is not safe to perform greater mobility than bed chair transfers.  Based upon pt's objective findings, pt demonstrated reduced functional mobility, reduced functional transfers, poor standing balance, reduced functional activity tolerance, secondary due to BLE weakness, deconditioning, limiting their capacity to perform functional transfers and ADLs safely and independently.  Based upon these deficits/impairments, pt would benefit from skilled acute physical therapy services to address the above deficits and improve their functional status.  PT recommends pt discharge to skilled nursing-short-term rehab in order to improve pt's functional status, safety and independence with functional mobility and overall QOL.      Recommendations for follow up therapy are one component of a multi-disciplinary discharge planning process, led by the attending physician.  Recommendations may be updated based on patient status, additional functional criteria and insurance authorization.  Follow Up Recommendations Skilled nursing-short term rehab (<3 hours/day) Can patient physically be transported by private vehicle:  (Unknown currently.)    Assistance Recommended at Discharge Intermittent Supervision/Assistance  Patient can return home with the following  A lot of help with walking and/or transfers;A lot of help with bathing/dressing/bathroom    Equipment Recommendations Other (comment) (Defer to postacute rehab.)  Recommendations for Other Services       Functional Status Assessment Patient has had a recent decline in their functional status and demonstrates the ability to make significant  improvements in function in a reasonable and  predictable amount of time.     Precautions / Restrictions Precautions Precautions: Fall Restrictions Weight Bearing Restrictions: No      Mobility  Bed Mobility Overal bed mobility: Needs Assistance Bed Mobility: Supine to Sit     Supine to sit: HOB elevated, Modified independent (Device/Increase time)     General bed mobility comments: Laboreds effort and use of rail with HOB elevated quite high.  Patient limited secondary to body habitus. Patient Response: Cooperative  Transfers Overall transfer level: Needs assistance Equipment used: Rolling walker (2 wheels) Transfers: Sit to/from Stand, Bed to chair/wheelchair/BSC Sit to Stand: Mod assist   Step pivot transfers: Mod assist       General transfer comment: Labored movement with poor control from EOB to chair with RW.  Forward trunk flexion posture with increased support on RW for transfer.  Poor eccentric control to recliner noted with fall like stand/sit.  Patient given cues for control descent with UE on armrests.    Ambulation/Gait     General Gait Details: Nonambulatory at baseline except for step pivot transfer to wheelchair.      Balance Overall balance assessment: Needs assistance Sitting-balance support: No upper extremity supported, Feet supported Sitting balance-Leahy Scale: Good Sitting balance - Comments: seated EOB.  Appropriate trunk control.   Standing balance support: Bilateral upper extremity supported, During functional activity, Reliant on assistive device for balance Standing balance-Leahy Scale: Poor Standing balance comment: using RW.  Increased forward flex trunk posture.  Very limited anticipatory/postural reactions.             Pertinent Vitals/Pain Pain Assessment Pain Assessment: No/denies pain    Home Living Family/patient expects to be discharged to:: Private residence Living Arrangements: Alone Available Help at Discharge: Family;Available PRN/intermittently;Other  (Comment) (CNA comes sometimes to help clean.) Type of Home: House Home Access: Ramped entrance       Home Layout: One level Home Equipment: Agricultural consultant (2 wheels);Wheelchair - manual;Grab bars - toilet;Grab bars - tub/shower;Shower seat - built in;BSC/3in1;Other (comment) (Hoyer lift; lift chair)      Prior Function Prior Level of Function : Needs assist       Physical Assist : Mobility (physical);ADLs (physical)   ADLs (physical): IADLs Mobility Comments: Houshold mobility with w/c. Uses RW for transfers. No assist needed. Sleeps in lift chair. ADLs Comments: Independent ADL's. Uses sock aide. Assisted for IADL's.     Hand Dominance   Dominant Hand: Right    Extremity/Trunk Assessment   Upper Extremity Assessment Upper Extremity Assessment: Defer to OT evaluation    Lower Extremity Assessment Lower Extremity Assessment: Generalized weakness    Cervical / Trunk Assessment Cervical / Trunk Assessment: Kyphotic  Communication   Communication: No difficulties  Cognition Arousal/Alertness: Awake/alert Behavior During Therapy: WFL for tasks assessed/performed Overall Cognitive Status: Within Functional Limits for tasks assessed                     Assessment/Plan    PT Assessment Patient needs continued PT services  PT Problem List Decreased strength;Obesity;Decreased activity tolerance;Decreased balance;Decreased mobility       PT Treatment Interventions DME instruction;Balance training;Gait training;Neuromuscular re-education;Cognitive remediation;Stair training;Functional mobility training;Therapeutic activities;Therapeutic exercise;Patient/family education    PT Goals (Current goals can be found in the Care Plan section)  Acute Rehab PT Goals Patient Stated Goal: "I want to go home" PT Goal Formulation: With patient Time For Goal Achievement: 04/20/22 Potential to Achieve Goals: Good  Frequency Min 3X/week     Co-evaluation   Reason for  Co-Treatment: To address functional/ADL transfers   OT goals addressed during session: ADL's and self-care       AM-PAC PT "6 Clicks" Mobility  Outcome Measure Help needed turning from your back to your side while in a flat bed without using bedrails?: None Help needed moving from lying on your back to sitting on the side of a flat bed without using bedrails?: None Help needed moving to and from a bed to a chair (including a wheelchair)?: A Lot Help needed standing up from a chair using your arms (e.g., wheelchair or bedside chair)?: A Lot Help needed to walk in hospital room?: Total Help needed climbing 3-5 steps with a railing? : Total 6 Click Score: 14    End of Session Equipment Utilized During Treatment: Gait belt Activity Tolerance: Patient tolerated treatment well;Patient limited by fatigue;Other (comment) (Patient limited by BLE weakness) Patient left: in chair;with bed alarm set;with call bell/phone within reach;Other (comment) (Nursing notified of patient position) Nurse Communication: Mobility status PT Visit Diagnosis: Unsteadiness on feet (R26.81);Other abnormalities of gait and mobility (R26.89);Muscle weakness (generalized) (M62.81)    Time: 4481-8563 PT Time Calculation (min) (ACUTE ONLY): 23 min   Charges:              Nelida Meuse PT, DPT Physical Therapist with [facility] [phone] office  Nelida Meuse 04/06/2022, 10:38 AM

## 2022-04-06 NOTE — Plan of Care (Signed)
  Problem: Acute Rehab PT Goals(only PT should resolve) Goal: Pt Will Go Sit To Supine/Side Outcome: Progressing Flowsheets (Taken 04/06/2022 1049) Pt will go Sit to Supine/Side: Independently Goal: Patient Will Transfer Sit To/From Stand Outcome: Progressing Flowsheets (Taken 04/06/2022 1049) Patient will transfer sit to/from stand:  with modified independence  with supervision Note: With RW Goal: Pt Will Transfer Bed To Chair/Chair To Bed Outcome: Progressing Flowsheets (Taken 04/06/2022 1049) Pt will Transfer Bed to Chair/Chair to Bed:  with modified independence  with supervision  Other (comment) Note: With RW Goal: PT Additional Goal #1 Outcome: Progressing Flowsheets (Taken 04/06/2022 1049) Additional Goal #1: Patient will tolerate greater than 15 minutes of BLE therapeutic exercises to improve patient's mental and functional mobility in order to patient's independence with transfers and reduce burden of care. Note: TE to be performed next session   Dawn Gonzales PT, DPT Physical Therapist with [facility] [phone] office

## 2022-04-06 NOTE — Evaluation (Signed)
Occupational Therapy Evaluation Patient Details Name: Dawn Gonzales MRN: [id number] DOB: October 11, 1940 Today's Date: 04/06/2022   History of Present Illness Dawn Gonzales  is a 81 y.o. female, with past medical history of complete heart block, has pacemaker, 3 of severe aortic stenosis, status post TAVR, lymphedema, venous stasis, following with wound clinic and vascular surgery, hyperlipidemia, chronic diastolic CHF, endocarditis, history of CVA, hypertension,.  -Patient ports cough, dyspnea over the last 2 weeks, she has pulse ox at home, for which she noted her oxygen saturation to be in the mid 80s, she denies any dyspnea at rest, but it is exacerbated by activity, was referred to have chest x-ray by her PCP, she was significantly dyspneic, her oxygen saturation noted to be in the mid 70s when she arrived to EMS, she had mild wheezing initially, but this has resolved after receiving IV steroids, patient denies fever, chills, chest pain, hemoptysis, or coffee-ground emesis, she reports chronic lower extremity lymphedema, appears to be stable at her baseline.  - in ED usual saturation was in the mid 70s, but this has improved with oxygen, she had wheezing which has improved with IV steroids, CTA chest negative for PE, but significant for airway inflammation, dyspnea much improved with steroids and nebulizer treatment, BNP was mildly elevated, some vascular congestion noted on imaging as well, so she received Lasix, [facility] hospitalist consulted to admit for new oxygen requirement. (Per MD)   Clinical Impression   Pt agreeable to OT and PT co-evaluation. Pt is mostly independent at baseline with assist primarily for IADL's. W/c is used for mobility much of the time. Today pt required no assist for bed mobility as long as HOB was raised to near 90*. Pt is generally weak and needing mod A to boost from EOB with quick and somewhat unsafe transfer to chair once standing. Pt uses a lift chair at  baseline but does also use the commode and is typically able to transfer without assist. Pt on 3 L supplemental O2 at start of session, which was removed with pt quickly desaturating to 79% SpO2. Pt able to increase to ~89% SpO2 while seated at EOB with cuing for pursed lip breathing. Once standing pt desaturated again to low 80s and required to be put back on 3L supplemental O2 to return to above 90% SpO2. Pt will benefit from continued OT in the hospital and recommended venue below to increase strength, balance, and endurance for safe ADL's.        Recommendations for follow up therapy are one component of a multi-disciplinary discharge planning process, led by the attending physician.  Recommendations may be updated based on patient status, additional functional criteria and insurance authorization.   Follow Up Recommendations  Skilled nursing-short term rehab (<3 hours/day)     Assistance Recommended at Discharge Intermittent Supervision/Assistance  Patient can return home with the following A lot of help with walking and/or transfers;A little help with bathing/dressing/bathroom;Assistance with cooking/housework;Assist for transportation;Help with stairs or ramp for entrance    Functional Status Assessment  Patient has had a recent decline in their functional status and demonstrates the ability to make significant improvements in function in a reasonable and predictable amount of time.  Equipment Recommendations  None recommended by OT    Recommendations for Other Services       Precautions / Restrictions Precautions Precautions: Fall Restrictions Weight Bearing Restrictions: No      Mobility Bed Mobility Overal bed mobility: Needs Assistance Bed Mobility: Supine to Sit  Supine to sit: HOB elevated, Modified independent (Device/Increase time)     General bed mobility comments: Laboreds effort and use of rail with HOB elevated quite high.    Transfers Overall transfer  level: Needs assistance Equipment used: Rolling walker (2 wheels) Transfers: Sit to/from Stand, Bed to chair/wheelchair/BSC Sit to Stand: Mod assist     Step pivot transfers: Mod assist     General transfer comment: Labored movement with poor control from EOB to chair with RW and assist from PT.      Balance Overall balance assessment: Needs assistance Sitting-balance support: No upper extremity supported, Feet supported Sitting balance-Leahy Scale: Good Sitting balance - Comments: seated EOB   Standing balance support: Bilateral upper extremity supported, During functional activity, Reliant on assistive device for balance Standing balance-Leahy Scale: Poor Standing balance comment: using RW                           ADL either performed or assessed with clinical judgement   ADL Overall ADL's : Needs assistance/impaired     Grooming: Sitting;Set up   Upper Body Bathing: Set up;Sitting   Lower Body Bathing: Minimal assistance;Moderate assistance;Sitting/lateral leans   Upper Body Dressing : Set up;Sitting   Lower Body Dressing: Maximal assistance;Sitting/lateral leans Lower Body Dressing Details (indicate cue type and reason): Max A today but pt uses sock aide at baseline. Toilet Transfer: Moderate assistance;Rolling walker (2 wheels);Stand-pivot Statistician Details (indicate cue type and reason): Simulated via EOB to chair with RW Toileting- Clothing Manipulation and Hygiene: Moderate assistance;Sitting/lateral lean               Vision Baseline Vision/History: 1 Wears glasses Ability to See in Adequate Light: 0 Adequate Patient Visual Report: No change from baseline Vision Assessment?: No apparent visual deficits                Pertinent Vitals/Pain Pain Assessment Pain Assessment: No/denies pain     Hand Dominance Right   Extremity/Trunk Assessment Upper Extremity Assessment Upper Extremity Assessment: Generalized weakness   Lower  Extremity Assessment Lower Extremity Assessment: Defer to PT evaluation   Cervical / Trunk Assessment Cervical / Trunk Assessment: Kyphotic   Communication Communication Communication: No difficulties   Cognition Arousal/Alertness: Awake/alert Behavior During Therapy: WFL for tasks assessed/performed Overall Cognitive Status: Within Functional Limits for tasks assessed                                                        Home Living Family/patient expects to be discharged to:: Private residence Living Arrangements: Alone Available Help at Discharge: Family;Available PRN/intermittently;Other (Comment) (CNA comes sometimes to help clean.) Type of Home: House Home Access: Ramped entrance     Home Layout: One level     Bathroom Shower/Tub: Producer, television/film/video: Handicapped height Bathroom Accessibility: Yes How Accessible: Accessible via walker;Accessible via wheelchair Home Equipment: Rolling Walker (2 wheels);Wheelchair - manual;Grab bars - toilet;Grab bars - tub/shower;Shower seat - built in;BSC/3in1;Other (comment) (Hoyer lift; lift chair)          Prior Functioning/Environment Prior Level of Function : Needs assist       Physical Assist : Mobility (physical);ADLs (physical)   ADLs (physical): IADLs Mobility Comments: Houshold mobility with w/c. Uses RW for transfers. No assist needed.  Sleeps in lift chair. ADLs Comments: Independent ADL's. Uses sock aide. Assisted for IADL's.        OT Problem List: Decreased strength;Decreased activity tolerance;Impaired balance (sitting and/or standing);Obesity      OT Treatment/Interventions: Self-care/ADL training;Therapeutic exercise;Therapeutic activities;Patient/family education;Balance training    OT Goals(Current goals can be found in the care plan section) Acute Rehab OT Goals Patient Stated Goal: return home OT Goal Formulation: With patient Time For Goal Achievement:  04/20/22 Potential to Achieve Goals: Good  OT Frequency: Min 2X/week    Co-evaluation PT/OT/SLP Co-Evaluation/Treatment: Yes Reason for Co-Treatment: To address functional/ADL transfers   OT goals addressed during session: ADL's and self-care      AM-PAC OT "6 Clicks" Daily Activity     Outcome Measure Help from another person eating meals?: None Help from another person taking care of personal grooming?: A Little Help from another person toileting, which includes using toliet, bedpan, or urinal?: A Little Help from another person bathing (including washing, rinsing, drying)?: A Lot Help from another person to put on and taking off regular upper body clothing?: A Little Help from another person to put on and taking off regular lower body clothing?: A Lot 6 Click Score: 17   End of Session Equipment Utilized During Treatment: Rolling walker (2 wheels);Gait belt;Oxygen Nurse Communication: Mobility status  Activity Tolerance: Patient tolerated treatment well Patient left: in chair;with call bell/phone within reach  OT Visit Diagnosis: Unsteadiness on feet (R26.81);Other abnormalities of gait and mobility (R26.89);Muscle weakness (generalized) (M62.81)                Time: 1884-1660 OT Time Calculation (min): 22 min Charges:  OT General Charges $OT Visit: 1 Visit OT Evaluation $OT Eval Low Complexity: 1 Low  Jakaree Pickard OT, MOT  Danie Chandler 04/06/2022, 9:47 AM

## 2022-04-06 NOTE — Progress Notes (Signed)
   04/06/22 0502  Vitals  Temp (!) 97.5 F (36.4 C)  Temp Source Oral  BP (!) 103/51  MAP (mmHg) 68  BP Location Left Arm  BP Method Automatic  Patient Position (if appropriate) Lying  Pulse Rate 100  Pulse Rate Source Monitor  Resp 19  MEWS COLOR  MEWS Score Color Green  Oxygen Therapy  SpO2 91 %  O2 Device Nasal Cannula  MEWS Score  MEWS Temp 0  MEWS Systolic 0  MEWS Pulse 0  MEWS RR 0  MEWS LOC 0  MEWS Score 0  Provider Notification  Provider Name/Title Dr. Carren Rang  Date Provider Notified 04/06/22  Time Provider Notified [phone]  Method of Notification  (secure chat)  Notification Reason Critical Result  Test performed and critical result PCO2 84  Date Critical Result Received 04/06/22  Time Critical Result Received 0528  Provider response  (New order for CPAP at bedtime)  Date of Provider Response 04/06/22  Time of Provider Response [phone]

## 2022-04-06 NOTE — Hospital Course (Addendum)
Dawn Gonzales  is a 81 y.o. female, with past medical history of complete heart block, has pacemaker, 3 of severe aortic stenosis, status post TAVR, lymphedema, venous stasis, following with wound clinic and vascular surgery, hyperlipidemia, chronic diastolic CHF, endocarditis, history of CVA, hypertension,. -Patient ports cough, dyspnea over the last 2 weeks, she has pulse ox at home, for which she noted her oxygen saturation to be in the mid 80s, she denies any dyspnea at rest, but it is exacerbated by activity, was referred to have chest x-ray by her PCP, she was significantly dyspneic, her oxygen saturation noted to be in the mid 70s when she arrived to EMS, she had mild wheezing initially, but this has resolved after receiving IV steroids, patient denies fever, chills, chest pain, hemoptysis, or coffee-ground emesis, she reports chronic lower extremity lymphedema, appears to be stable at her baseline.  ED: Saturation was in the mid 70s, but this has improved with oxygen, she had wheezing which has improved with IV steroids, CTA chest negative for PE, but significant for airway inflammation, dyspnea much improved with steroids and nebulizer treatment, BNP was mildly elevated, some vascular congestion noted on imaging as well, so she received Lasix.  [facility] hospitalist consulted to admit for new oxygen requirement.    Assessment & Plan:     Principal Problem:   Acute respiratory failure with hypoxia (HCC) Active Problems:   Severe aortic stenosis   Obesity, Class III, BMI 40-49.9 (morbid obesity) (HCC)   Sleep apnea   Hypertension   Hyperlipemia   Chronic diastolic CHF (congestive heart failure), NYHA class 2 (HCC)   Acute respiratory failure with hypoxia/ Acute on chronic diastolic CHF- HFpEF Atelectasis -Still complain of shortness of breath, but denies any chest pain -Continue IV diuretics Lasix 40 mg twice daily  -Recent echocardiogram reviewed preserved ejection fraction 55-  60%, grade 1 diastolic dysfunction noted -At baseline she is with no oxygen requirement, currently requiring 2 L nasal cannula. -Peers to be multifactorial: some evidence of volume overload on imaging, BNP is elevated  . -DZH:GDJMEQASTM inflammation/small airway disease - started on azithromycin and scheduled DuoNebs  -Has some atelectasis on imaging, continue with incentive spirometer and encouraged to use -May  need home oxygen on discharge. -She is with diagnosis of mild sleep apnea, but daughter reports study was done 2 years ago, only mild disease, reports there was no indication for CPAP, but she is high risk OHS, of pCO2 is elevated, then will need referral to pulmonary for OHS evaluation. -CTA chest negative for PE  -Will monitor daily weight, I's and O's Filed Weights   04/05/22 1040 04/06/22 0112  Weight: 131.5 kg (!) 136.1 kg    Intake/Output Summary (Last 24 hours) at 04/06/2022 1055 Last data filed at 04/06/2022 0900 Gross per 24 hour  Intake 340.97 ml  Output 1100 ml  Net -759.03 ml    Will continue with diuresing today, will ambulate to evaluate patient for qualification of home O2    Hypokalemia - repleted   Obsesity Body mass index is 46.81 kg/m.   Lymphedema/chronic venous stasis -she is on wound care at home, to continue wound care upon discharge   History of chronic heart block -She has a pacemaker   Severe aortic stenosis -Status post-TAVR March 2020   Hyperlipidemia -Continue with Pravachol

## 2022-04-07 DIAGNOSIS — G629 Polyneuropathy, unspecified: Secondary | ICD-10-CM | POA: Diagnosis not present

## 2022-04-07 DIAGNOSIS — L97301 Non-pressure chronic ulcer of unspecified ankle limited to breakdown of skin: Secondary | ICD-10-CM | POA: Diagnosis not present

## 2022-04-07 DIAGNOSIS — I442 Atrioventricular block, complete: Secondary | ICD-10-CM | POA: Diagnosis not present

## 2022-04-07 DIAGNOSIS — E785 Hyperlipidemia, unspecified: Secondary | ICD-10-CM | POA: Diagnosis not present

## 2022-04-07 DIAGNOSIS — E559 Vitamin D deficiency, unspecified: Secondary | ICD-10-CM | POA: Diagnosis not present

## 2022-04-07 DIAGNOSIS — G4733 Obstructive sleep apnea (adult) (pediatric): Secondary | ICD-10-CM | POA: Diagnosis not present

## 2022-04-07 DIAGNOSIS — Z950 Presence of cardiac pacemaker: Secondary | ICD-10-CM | POA: Diagnosis not present

## 2022-04-07 DIAGNOSIS — Z9981 Dependence on supplemental oxygen: Secondary | ICD-10-CM | POA: Diagnosis not present

## 2022-04-07 DIAGNOSIS — I5023 Acute on chronic systolic (congestive) heart failure: Secondary | ICD-10-CM | POA: Diagnosis not present

## 2022-04-07 DIAGNOSIS — Z8673 Personal history of transient ischemic attack (TIA), and cerebral infarction without residual deficits: Secondary | ICD-10-CM | POA: Diagnosis not present

## 2022-04-07 DIAGNOSIS — L89133 Pressure ulcer of right lower back, stage 3: Secondary | ICD-10-CM | POA: Diagnosis not present

## 2022-04-07 DIAGNOSIS — I83028 Varicose veins of left lower extremity with ulcer other part of lower leg: Secondary | ICD-10-CM | POA: Diagnosis not present

## 2022-04-07 DIAGNOSIS — J9811 Atelectasis: Secondary | ICD-10-CM | POA: Diagnosis not present

## 2022-04-07 DIAGNOSIS — I350 Nonrheumatic aortic (valve) stenosis: Secondary | ICD-10-CM | POA: Diagnosis not present

## 2022-04-07 DIAGNOSIS — I5032 Chronic diastolic (congestive) heart failure: Secondary | ICD-10-CM | POA: Diagnosis not present

## 2022-04-07 DIAGNOSIS — I83003 Varicose veins of unspecified lower extremity with ulcer of ankle: Secondary | ICD-10-CM | POA: Diagnosis not present

## 2022-04-07 DIAGNOSIS — J9600 Acute respiratory failure, unspecified whether with hypoxia or hypercapnia: Secondary | ICD-10-CM | POA: Diagnosis not present

## 2022-04-07 DIAGNOSIS — R279 Unspecified lack of coordination: Secondary | ICD-10-CM | POA: Diagnosis not present

## 2022-04-07 DIAGNOSIS — R0600 Dyspnea, unspecified: Secondary | ICD-10-CM | POA: Diagnosis not present

## 2022-04-07 DIAGNOSIS — G4730 Sleep apnea, unspecified: Secondary | ICD-10-CM | POA: Diagnosis not present

## 2022-04-07 DIAGNOSIS — I890 Lymphedema, not elsewhere classified: Secondary | ICD-10-CM | POA: Diagnosis not present

## 2022-04-07 DIAGNOSIS — I10 Essential (primary) hypertension: Secondary | ICD-10-CM | POA: Diagnosis not present

## 2022-04-07 DIAGNOSIS — I639 Cerebral infarction, unspecified: Secondary | ICD-10-CM | POA: Diagnosis not present

## 2022-04-07 DIAGNOSIS — R069 Unspecified abnormalities of breathing: Secondary | ICD-10-CM | POA: Diagnosis not present

## 2022-04-07 DIAGNOSIS — I83018 Varicose veins of right lower extremity with ulcer other part of lower leg: Secondary | ICD-10-CM | POA: Diagnosis not present

## 2022-04-07 DIAGNOSIS — I509 Heart failure, unspecified: Secondary | ICD-10-CM | POA: Diagnosis not present

## 2022-04-07 DIAGNOSIS — I110 Hypertensive heart disease with heart failure: Secondary | ICD-10-CM | POA: Diagnosis not present

## 2022-04-07 DIAGNOSIS — I517 Cardiomegaly: Secondary | ICD-10-CM | POA: Diagnosis not present

## 2022-04-07 DIAGNOSIS — R5381 Other malaise: Secondary | ICD-10-CM | POA: Diagnosis not present

## 2022-04-07 DIAGNOSIS — D559 Anemia due to enzyme disorder, unspecified: Secondary | ICD-10-CM | POA: Diagnosis not present

## 2022-04-07 DIAGNOSIS — R2681 Unsteadiness on feet: Secondary | ICD-10-CM | POA: Diagnosis not present

## 2022-04-07 DIAGNOSIS — Z79899 Other long term (current) drug therapy: Secondary | ICD-10-CM | POA: Diagnosis not present

## 2022-04-07 DIAGNOSIS — J9611 Chronic respiratory failure with hypoxia: Secondary | ICD-10-CM | POA: Diagnosis not present

## 2022-04-07 DIAGNOSIS — R0902 Hypoxemia: Secondary | ICD-10-CM | POA: Diagnosis not present

## 2022-04-07 DIAGNOSIS — M6281 Muscle weakness (generalized): Secondary | ICD-10-CM | POA: Diagnosis not present

## 2022-04-07 DIAGNOSIS — G44009 Cluster headache syndrome, unspecified, not intractable: Secondary | ICD-10-CM | POA: Diagnosis not present

## 2022-04-07 DIAGNOSIS — J9601 Acute respiratory failure with hypoxia: Secondary | ICD-10-CM | POA: Diagnosis not present

## 2022-04-07 DIAGNOSIS — J449 Chronic obstructive pulmonary disease, unspecified: Secondary | ICD-10-CM | POA: Diagnosis not present

## 2022-04-07 DIAGNOSIS — R0602 Shortness of breath: Secondary | ICD-10-CM | POA: Diagnosis not present

## 2022-04-07 DIAGNOSIS — Z7401 Bed confinement status: Secondary | ICD-10-CM | POA: Diagnosis not present

## 2022-04-07 MED ORDER — AZITHROMYCIN 500 MG PO TABS: 500.0000 mg | ORAL_TABLET | Freq: Every day | ORAL | 0 refills | Status: AC

## 2022-04-07 MED ORDER — IPRATROPIUM-ALBUTEROL 0.5-2.5 (3) MG/3ML IN SOLN: 3.0000 mL | Freq: Two times a day (BID) | RESPIRATORY_TRACT | 0 refills | Status: AC

## 2022-04-07 MED ORDER — METHYLPREDNISOLONE 4 MG PO TBPK: ORAL_TABLET | ORAL | 0 refills | Status: DC

## 2022-04-07 NOTE — Care Management Important Message (Signed)
Important Message  Patient Details  Name: Dawn Gonzales MRN: [id number] Date of Birth: 1940/08/24   Medicare Important Message Given:  Yes     Corey Harold 04/07/2022, 12:52 PM

## 2022-04-07 NOTE — Discharge Summary (Signed)
Physician Discharge Summary   Patient: Dawn Gonzales MRN: [id number] DOB: 1941-03-30  Admit date:     04/05/2022  Discharge date: 04/07/22  Discharge Physician: Kendell Bane   PCP: Benita Stabile, MD   Recommendations at discharge:   - Follow-up with the PCP in 1-2 weeks -Aggressive PT OT, fall precautions -Continue supplemental oxygen -Wound care team needs to be consulted, continue bilateral lower extremity wound care for chronic venous stasis -Outpatient follow-up palliative care recommended -Outpatient follow-up with pulmonologist -Continue CPAP machine at night while sleeping -Continue O2 via nasal cannula -to maintain O2 sat > 92%  Discharge Diagnoses: Principal Problem:   Acute respiratory failure with hypoxia (HCC) Active Problems:   Severe aortic stenosis   Obesity, Class III, BMI 40-49.9 (morbid obesity) (HCC)   Sleep apnea   Hypertension   Hyperlipemia   Chronic diastolic CHF (congestive heart failure), NYHA class 2 (HCC)  Resolved Problems:   * No resolved hospital problems. *  Hospital Course: Dawn Gonzales  is a 81 y.o. female, with past medical history of complete heart block, has pacemaker, 3 of severe aortic stenosis, status post TAVR, lymphedema, venous stasis, following with wound clinic and vascular surgery, hyperlipidemia, chronic diastolic CHF, endocarditis, history of CVA, hypertension,. -Patient ports cough, dyspnea over the last 2 weeks, she has pulse ox at home, for which she noted her oxygen saturation to be in the mid 80s, she denies any dyspnea at rest, but it is exacerbated by activity, was referred to have chest x-ray by her PCP, she was significantly dyspneic, her oxygen saturation noted to be in the mid 70s when she arrived to EMS, she had mild wheezing initially, but this has resolved after receiving IV steroids, patient denies fever, chills, chest pain, hemoptysis, or coffee-ground emesis, she reports chronic lower extremity  lymphedema, appears to be stable at her baseline.  ED: Saturation was in the mid 70s, but this has improved with oxygen, she had wheezing which has improved with IV steroids, CTA chest negative for PE, but significant for airway inflammation, dyspnea much improved with steroids and nebulizer treatment, BNP was mildly elevated, some vascular congestion noted on imaging as well, so she received Lasix.  [facility] hospitalist consulted to admit for new oxygen requirement.     Acute respiratory failure with hypoxia/ Acute on chronic diastolic CHF- HFpEF Atelectasis -Still complain of shortness of breath, but denies any chest pain -Continue IV diuretics Lasix 40 mg twice daily  -Recent echocardiogram reviewed preserved ejection fraction 55- 60%, grade 1 diastolic dysfunction noted -At baseline she is with no oxygen requirement, currently requiring 2 L nasal cannula. -Peers to be multifactorial: some evidence of volume overload on imaging, BNP is elevated  . -ZDG:LOVFIEPPIR inflammation/small airway disease - started on azithromycin and scheduled DuoNebs  -Has some atelectasis on imaging, continue with incentive spirometer and encouraged to use -May  need home oxygen on discharge. -She is with diagnosis of mild sleep apnea, but daughter reports study was done 2 years ago, only mild disease, reports there was no indication for CPAP, but she is high risk OHS, of pCO2 is elevated, then will need referral to pulmonary for OHS evaluation. -CTA chest negative for PE  -Will monitor daily weight, I's and O's  Intake/Output Summary (Last 24 hours) at 04/07/2022 1057 Last data filed at 04/07/2022 0900 Gross per 24 hour  Intake 720 ml  Output 1950 ml  Net -1230 ml   Filed Weights   04/05/22 1040 04/06/22 0112 04/07/22  [id number]  Weight: 131.5 kg (!) 136.1 kg 134.5 kg     Intake/Output Summary (Last 24 hours) at 04/06/2022 1055 Last data filed at 04/06/2022 0900 Gross per 24 hour  Intake 340.97 ml  Output  1100 ml  Net -759.03 ml    -Continue O2 via nasal cannula maintaining>O2 sat 92% -Tapering down p.o. steroids, empiric antibiotics for 3 more days -Continue home dose diuretics-torsemide    Hypokalemia - repleted   Obsesity Body mass index is 46.81 kg/m.   Lymphedema/chronic venous stasis -she is on wound care at home, to continue wound care upon discharge   History of chronic heart block -She has a pacemaker   Severe aortic stenosis -Status post-TAVR March 2020   Hyperlipidemia -Continue with Pravachol    Assessment and Plan: No notes have been filed under this hospital service. Service: Hospitalist       Consultants: none  Procedures performed: none   Disposition: Skilled nursing facility Diet recommendation:  Discharge Diet Orders (From admission, onward)     Start     Ordered   04/07/22 0000  Diet - low sodium heart healthy        04/07/22 1054           Cardiac diet DISCHARGE MEDICATION: Allergies as of 04/07/2022   No Known Allergies      Medication List     STOP taking these medications    metoCLOPramide 10 MG tablet Commonly known as: Reglan   ondansetron 4 MG disintegrating tablet Commonly known as: Zofran ODT   traMADol 50 MG tablet Commonly known as: ULTRAM       TAKE these medications    acetaminophen 325 MG tablet Commonly known as: TYLENOL Take 2 tablets (650 mg total) by mouth every 6 (six) hours as needed for mild pain, fever or headache (or Fever >/= 101).   albuterol 108 (90 Base) MCG/ACT inhaler Commonly known as: VENTOLIN HFA Inhale 2 puffs into the lungs every 6 (six) hours as needed. What changed: Another medication with the same name was removed. Continue taking this medication, and follow the directions you see here.   ascorbic acid 500 MG tablet Commonly known as: VITAMIN C Take 500 mg by mouth daily.   aspirin EC 81 MG tablet Take 1 tablet (81 mg total) by mouth daily with breakfast.   azithromycin  500 MG tablet Commonly known as: Zithromax Take 1 tablet (500 mg total) by mouth daily for 3 days. Take 1 tablet daily for 3 days.   CALCIUM-CARB 600 PO Take 600 mg by mouth daily.   cholecalciferol 1000 units tablet Commonly known as: VITAMIN D Take 1,000 Units by mouth daily.   ferrous sulfate 325 (65 FE) MG tablet Take 325 mg by mouth daily with breakfast.   gabapentin 300 MG capsule Commonly known as: NEURONTIN Take 300 mg by mouth 3 (three) times daily.   ipratropium-albuterol 0.5-2.5 (3) MG/3ML Soln Commonly known as: DUONEB Take 3 mLs by nebulization 2 (two) times daily.   methylPREDNISolone 4 MG Tbpk tablet Commonly known as: MEDROL DOSEPAK Medrol Dosepak take as instructed   multivitamin with minerals Tabs tablet Take 1 tablet by mouth daily.   nystatin powder Commonly known as: MYCOSTATIN/NYSTOP APPLY TO AFFECTED AREA TWICE A DAY   polyethylene glycol 17 g packet Commonly known as: MiraLax Mix-In Pax Take 17 g by mouth daily as needed for mild constipation or moderate constipation.   pravastatin 20 MG tablet Commonly known as: PRAVACHOL Take 20 mg by  mouth daily.   torsemide 20 MG tablet Commonly known as: DEMADEX Take 40 mg by mouth daily.   venlafaxine 37.5 MG tablet Commonly known as: EFFEXOR Take 37.5 mg by mouth daily.               Durable Medical Equipment  (From admission, onward)           Start     Ordered   04/07/22 0727  For home use only DME oxygen  Once       Question Answer Comment  Length of Need 12 Months   Mode or (Route) Nasal cannula   Liters per Minute 2   Frequency Continuous (stationary and portable oxygen unit needed)   Oxygen conserving device Yes   Oxygen delivery system Gas      04/07/22 0726              Discharge Care Instructions  (From admission, onward)           Start     Ordered   04/07/22 0000  Discharge wound care:       Comments: Wound care team needs to be consulted, continue  wound care of lower extremity for chronic bilateral lower extremity venous stasis-wounds   04/07/22 1054   04/07/22 0000  Discharge wound care:       Comments: Per wound care RN instructions   04/07/22 1055            Contact information for after-discharge care     [facility] .   Service: Skilled Nursing Contact information: [address] [place] [phone]                    Discharge Exam: Danley Danker Weights   04/05/22 1040 04/06/22 0112 04/07/22 0439  Weight: 131.5 kg (!) 136.1 kg 134.5 kg      Physical Exam:   General:  AAO x 3,  cooperative, morbidly obese female in a chronic respiratory distress with shortness of breath, improved with supplemental oxygen  HEENT:  Normocephalic, PERRL, otherwise with in Normal limits   Neuro:  CNII-XII intact. , normal motor and sensation, reflexes intact   Lungs:   Clear to auscultation BL, Respirations unlabored,  No wheezes / crackles  Cardio:    S1/S2, RRR, No murmure, No Rubs or Gallops   Abdomen:  Soft, non-tender, bowel sounds active all four quadrants, no guarding or peritoneal signs.  Muscular  skeletal:  Limited exam -global generalized weaknesses - in bed, able to move all 4 extremities,   2+ pulses,  symmetric, bilateral chronic lower extremity edema erythema due to chronic venous stasis  Skin:  Dry, warm to touch, superficial erythema edema bilateral lower extremity, with superficial wounds-no signs of infection  Wounds: Please see nursing documentation  Pressure Injury 04/06/22 Buttocks Right  (Active)  04/06/22 0127  Location: Buttocks  Location Orientation: Right  Staging: -- (pinhole size open area)  Wound Description (Comments):   Present on Admission: Yes  Dressing Type Foam - Lift dressing to assess site every shift 04/07/22 0900          Condition at discharge: fair  The results of significant  diagnostics from this hospitalization (including imaging, microbiology, ancillary and laboratory) are listed below for reference.   Imaging Studies: CT Angio Chest PE W and/or Wo Contrast  Result Date: 04/05/2022 CLINICAL DATA:  Shortness of breath. EXAM: CT ANGIOGRAPHY  CHEST WITH CONTRAST TECHNIQUE: Multidetector CT imaging of the chest was performed using the standard protocol during bolus administration of intravenous contrast. Multiplanar CT image reconstructions and MIPs were obtained to evaluate the vascular anatomy. RADIATION DOSE REDUCTION: This exam was performed according to the departmental dose-optimization program which includes automated exposure control, adjustment of the mA and/or kV according to patient size and/or use of iterative reconstruction technique. CONTRAST:  68mL OMNIPAQUE IOHEXOL 350 MG/ML SOLN COMPARISON:  August 23, 2019. FINDINGS: Cardiovascular: Satisfactory opacification of the pulmonary arteries to the segmental level. No evidence of pulmonary embolism. Moderate cardiomegaly. 4 cm ascending thoracic aortic aneurysm. Status post transcatheter aortic valve repair. No pericardial effusion. Mild coronary calcifications are noted. Mediastinum/Nodes: No enlarged mediastinal, hilar, or axillary lymph nodes. Thyroid gland, trachea, and esophagus demonstrate no significant findings. Lungs/Pleura: No pneumothorax or pleural effusion is noted. Right middle lobe subsegmental atelectasis is noted. Mosaic pattern is noted throughout both lungs suggesting either air trapping secondary to small airways disease or multifocal inflammation. Upper Abdomen: No acute abnormality. Musculoskeletal: No chest wall abnormality. No acute or significant osseous findings. Review of the MIP images confirms the above findings. IMPRESSION: No definite evidence of pulmonary embolus. 4 cm ascending thoracic aortic aneurysm. Recommend annual imaging followup by CTA or MRA. This recommendation follows 2010  ACCF/AHA/AATS/ACR/ASA/SCA/SCAI/SIR/STS/SVM Guidelines for the Diagnosis and Management of Patients with Thoracic Aortic Disease. Circulation. 2010; 121ML:[id number]. Aortic aneurysm NOS (ICD10-I71.9). Moderate cardiomegaly. Right middle lobe subsegmental atelectasis. Mosaic pattern is noted throughout both lungs suggesting either air trapping secondary to small airways disease or multifocal inflammation. Electronically Signed   By: Marijo Conception M.D.   On: 04/05/2022 14:50   DG Chest Port 1 View  Result Date: 04/05/2022 CLINICAL DATA:  Shortness of breath.  Recent pneumonia. EXAM: PORTABLE CHEST 1 VIEW COMPARISON:  08/26/2019 FINDINGS: There is a left chest wall pacer device with leads in the right atrial appendage and right ventricle. Status post TAVR. Stable cardiac enlargement. Lung volumes are low. Asymmetric opacification at the left base is noted which may reflect atelectasis, airspace disease and/or left pleural effusion. Atelectasis is identified within the right midlung and right base. IMPRESSION: 1. Low lung volumes with asymmetric opacification at the left base may reflect atelectasis, airspace disease and/or left pleural effusion. 2. Right midlung and right base atelectasis. Electronically Signed   By: Kerby Moors M.D.   On: 04/05/2022 11:13    Microbiology: Results for orders placed or performed during the hospital encounter of 04/05/22  Culture, blood (Routine x 2)     Status: None (Preliminary result)   Collection Time: 04/05/22 10:51 AM   Specimen: BLOOD LEFT HAND  Result Value Ref Range Status   Specimen Description BLOOD LEFT HAND  Final   Special Requests   Final    BOTTLES DRAWN AEROBIC AND ANAEROBIC Blood Culture adequate volume   Culture   Final    NO GROWTH < 24 HOURS Performed at [facility], [address] [place], [place] [address]    Report Status PENDING  Incomplete  Culture, blood (Routine x 2)     Status: None (Preliminary result)   Collection Time: 04/05/22  10:59 AM   Specimen: Right Antecubital; Blood  Result Value Ref Range Status   Specimen Description RIGHT ANTECUBITAL  Final   Special Requests   Final    BOTTLES DRAWN AEROBIC AND ANAEROBIC Blood Culture adequate volume   Culture   Final    NO GROWTH < 24 HOURS Performed at  [facility], [address] [place], [place] [address]    Report Status PENDING  Incomplete  Resp Panel by RT-PCR (Flu A&B, Covid) Anterior Nasal Swab     Status: None   Collection Time: 04/05/22  4:55 PM   Specimen: Anterior Nasal Swab  Result Value Ref Range Status   SARS Coronavirus 2 by RT PCR NEGATIVE NEGATIVE Final    Comment: (NOTE) SARS-CoV-2 target nucleic acids are NOT DETECTED.  The SARS-CoV-2 RNA is generally detectable in upper respiratory specimens during the acute phase of infection. The lowest concentration of SARS-CoV-2 viral copies this assay can detect is 138 copies/mL. A negative result does not preclude SARS-Cov-2 infection and should not be used as the sole basis for treatment or other patient management decisions. A negative result may occur with  improper specimen collection/handling, submission of specimen other than nasopharyngeal swab, presence of viral mutation(s) within the areas targeted by this assay, and inadequate number of viral copies(<138 copies/mL). A negative result must be combined with clinical observations, patient history, and epidemiological information. The expected result is Negative.  Fact Sheet for Patients:  EntrepreneurPulse.com.au  Fact Sheet for Healthcare Providers:  IncredibleEmployment.be  This test is no t yet approved or cleared by the [place] FDA and  has been authorized for detection and/or diagnosis of SARS-CoV-2 by FDA under an Emergency Use Authorization (EUA). This EUA will remain  in effect (meaning this test can be used) for the duration of the COVID-19 declaration under Section 564(b)(1) of the Act,  21 U.S.C.section 360bbb-3(b)(1), unless the authorization is terminated  or revoked sooner.       Influenza A by PCR NEGATIVE NEGATIVE Final   Influenza B by PCR NEGATIVE NEGATIVE Final    Comment: (NOTE) The Xpert Xpress SARS-CoV-2/FLU/RSV plus assay is intended as an aid in the diagnosis of influenza from Nasopharyngeal swab specimens and should not be used as a sole basis for treatment. Nasal washings and aspirates are unacceptable for Xpert Xpress SARS-CoV-2/FLU/RSV testing.  Fact Sheet for Patients: EntrepreneurPulse.com.au  Fact Sheet for Healthcare Providers: IncredibleEmployment.be  This test is not yet approved or cleared by the [place] FDA and has been authorized for detection and/or diagnosis of SARS-CoV-2 by FDA under an Emergency Use Authorization (EUA). This EUA will remain in effect (meaning this test can be used) for the duration of the COVID-19 declaration under Section 564(b)(1) of the Act, 21 U.S.C. section 360bbb-3(b)(1), unless the authorization is terminated or revoked.  Performed at [facility], [address], [place], [place] [address]     Labs: CBC: Recent Labs  Lab 04/05/22 1051 04/06/22 0539  WBC 8.0 5.5  NEUTROABS 6.2  --   HGB 13.4 12.4  HCT 43.3 41.6  MCV 95.8 98.3  PLT 298 [id number]   Basic Metabolic Panel: Recent Labs  Lab 04/05/22 1051 04/06/22 0539  NA 140 142  K 3.4* 3.8  CL 96* 99  CO2 35* 35*  GLUCOSE 116* 135*  BUN 19 18  CREATININE 0.91 0.88  CALCIUM 9.1 8.5*  MG 2.2  --    Liver Function Tests: Recent Labs  Lab 04/05/22 1051  AST 17  ALT 13  ALKPHOS 64  BILITOT 0.5  PROT 7.5  ALBUMIN 3.7   CBG: No results for input(s): "GLUCAP" in the last 168 hours.  Discharge time spent: greater than 55 minutes.  Signed: Deatra James, MD [facility] 04/07/2022

## 2022-04-07 NOTE — TOC Transition Note (Signed)
Transition of Care [facility]) - CM/SW Discharge Note   Patient Details  Name: Dawn Gonzales MRN: [id number] Date of Birth: Apr 01, 1941  Transition of Care [facility]) CM/SW Contact:  Villa Herb, LCSWA Phone Number: 04/07/2022, 2:15 PM   Clinical Narrative:    Insurance Berkley Harvey has been approved for SNF at this time. CSW spoke to [place] in admissions at [facility] and they are ready for pt to arrive. RN and MD updated. [facility] waiver signed and [facility] has been called for transport. Pts daughter updated on D/C. TOC signing off.   Final next level of care: Skilled Nursing Facility Barriers to Discharge: Barriers Resolved   Patient Goals and CMS Choice Patient states their goals for this hospitalization and ongoing recovery are:: SNF CMS Medicare.gov Compare Post Acute Care list provided to:: Patient Choice offered to / list presented to : Patient, Adult Children  Discharge Placement                Patient to be transferred to facility by: [facility] Name of family member notified: Huntley Dec (daughter) Patient and family notified of of transfer: 04/07/22  Discharge Plan and Services In-house Referral: Clinical Social Work                          [facility] Agency: [facility]        Social Determinants of Health (SDOH) Interventions     Readmission Risk Interventions     No data to display

## 2022-04-07 NOTE — Progress Notes (Signed)
Physical Therapy Treatment Patient Details Name: Dawn Gonzales MRN: [id number] DOB: [birth year] Today's Date: 04/07/2022   History of Present Illness Dawn Gonzales  is a 81 y.o. female, with past medical history of complete heart block, has pacemaker, 3 of severe aortic stenosis, status post TAVR, lymphedema, venous stasis, following with wound clinic and vascular surgery, hyperlipidemia, chronic diastolic CHF, endocarditis, history of CVA, hypertension,.  -Patient ports cough, dyspnea over the last 2 weeks, she has pulse ox at home, for which she noted her oxygen saturation to be in the mid 80s, she denies any dyspnea at rest, but it is exacerbated by activity, was referred to have chest x-ray by her PCP, she was significantly dyspneic, her oxygen saturation noted to be in the mid 70s when she arrived to EMS, she had mild wheezing initially, but this has resolved after receiving IV steroids, patient denies fever, chills, chest pain, hemoptysis, or coffee-ground emesis, she reports chronic lower extremity lymphedema, appears to be stable at her baseline.  - in ED usual saturation was in the mid 70s, but this has improved with oxygen, she had wheezing which has improved with IV steroids, CTA chest negative for PE, but significant for airway inflammation, dyspnea much improved with steroids and nebulizer treatment, BNP was mildly elevated, some vascular congestion noted on imaging as well, so she received Lasix, [facility] hospitalist consulted to admit for new oxygen requirement.    PT Comments    Pt tolerated today's treatment session, patient on 2 L supplemental O2 throughout session. Today's session addressed functional activity tolerance and functional transfer capacity through BLE TE and STS training from elevated surface. Pt noted with inconsistent improvements with STS transfer, utilized elevated bed height to improve patient's confidence along with improved activity tolerance with multiple  STS trials.  Patient still needing min-mod assist to perform full stand, patient given heavy cueing for breathing, safe movement patterns, and education regarding increasing activity tolerance and strength. Pt would continue to benefit from skilled acute physical therapy services in order to progress toward POC goals, safety/independence with functional mobility and QOL.     Recommendations for follow up therapy are one component of a multi-disciplinary discharge planning process, led by the attending physician.  Recommendations may be updated based on patient status, additional functional criteria and insurance authorization.  Follow Up Recommendations  Skilled nursing-short term rehab (<3 hours/day) Can patient physically be transported by private vehicle:  (Unable to be determined at this time.)   Assistance Recommended at Discharge Intermittent Supervision/Assistance  Patient can return home with the following A lot of help with walking and/or transfers;A lot of help with bathing/dressing/bathroom   Equipment Recommendations  Other (comment)    Recommendations for Other Services       Precautions / Restrictions Precautions Precautions: Fall Restrictions Weight Bearing Restrictions: No     Mobility  Bed Mobility Overal bed mobility: Needs Assistance Bed Mobility: Supine to Sit, Sit to Supine     Supine to sit: HOB elevated, Modified independent (Device/Increase time), Mod assist     General bed mobility comments: Laboreds effort and use of rail with HOB elevated quite high.  Patient limited secondary to body habitus.  Mod assist x 1 for sit to supine for BLE back to bed, HOB elevated patient controlling trunk with right UE on bed rail. Patient Response: Cooperative  Transfers Overall transfer level: Needs assistance Equipment used: Rolling walker (2 wheels) Transfers: Sit to/from Stand Sit to Stand: Mod assist, Min assist, From elevated surface  General  transfer comment: Patient performed 10 S CS for elevated bed level with RW.  Patient would fluctuate between mod<> min assist x 1, patient would utilize BUE on RW, perform AP trunk sway for momentum to power up to stand, then patient would require additional assist from therapist to complete stand.  Patient given verbal cues upon standing to reduce posterior hip lean for glutes max activation.  Reduced cueing for glute max activation throughout 10 repetitions SOB noticed throughout 10 trial, patient educated and cued on for breathing control breathing to to reduce RPE and minimize shallow rapid breaths.  Patient on 2 L throughout STS trials.    Ambulation/Gait                   Stairs             Wheelchair Mobility    Modified Rankin (Stroke Patients Only)       Balance Overall balance assessment: Needs assistance Sitting-balance support: No upper extremity supported, Feet supported Sitting balance-Leahy Scale: Good Sitting balance - Comments: seated EOB.  Appropriate trunk control.  Intermittent RUE support on L during sitting duties.   Standing balance support: Bilateral upper extremity supported, During functional activity, Reliant on assistive device for balance Standing balance-Leahy Scale: Poor Standing balance comment: using RW.  Increased forward flex trunk posture.  Very limited anticipatory/postural reactions.  Patient able to achieve full standing with glutes max verbal cueing.  Heavy requirement on RW.                            Cognition Arousal/Alertness: Awake/alert Behavior During Therapy: WFL for tasks assessed/performed Overall Cognitive Status: Within Functional Limits for tasks assessed                                          Exercises General Exercises - Lower Extremity Long Arc Quad: 20 reps, Strengthening, AROM, Seated Hip ABduction/ADduction: 20 reps, Strengthening, Seated, AROM    General Comments         Pertinent Vitals/Pain Pain Assessment Pain Assessment: No/denies pain    Home Living                          Prior Function            PT Goals (current goals can now be found in the care plan section) Acute Rehab PT Goals Patient Stated Goal: "I want to go home" PT Goal Formulation: With patient Time For Goal Achievement: 04/20/22 Potential to Achieve Goals: Good Additional Goals Additional Goal #1: Patient will tolerate greater than 15 minutes of BLE therapeutic exercises to improve patient's mental and functional mobility in order to patient's independence with transfers and reduce burden of care.    Frequency    Min 3X/week      PT Plan Current plan remains appropriate    Co-evaluation              AM-PAC PT "6 Clicks" Mobility   Outcome Measure  Help needed turning from your back to your side while in a flat bed without using bedrails?: None Help needed moving from lying on your back to sitting on the side of a flat bed without using bedrails?: None Help needed moving to and from a bed to a chair (including a wheelchair)?:  A Lot Help needed standing up from a chair using your arms (e.g., wheelchair or bedside chair)?: A Lot Help needed to walk in hospital room?: A Lot Help needed climbing 3-5 steps with a railing? : Total 6 Click Score: 15    End of Session Equipment Utilized During Treatment: Gait belt Activity Tolerance: Patient tolerated treatment well;Patient limited by fatigue;Other (comment) Patient left: in bed;with call bell/phone within reach Nurse Communication: Mobility status PT Visit Diagnosis: Unsteadiness on feet (R26.81);Other abnormalities of gait and mobility (R26.89);Muscle weakness (generalized) (M62.81)     Time: [id number] PT Time Calculation (min) (ACUTE ONLY): 27 min  Charges:  $Therapeutic Exercise: 8-22 mins $Therapeutic Activity: 8-22 mins                     Wonda Olds PT, DPT Physical Therapist with  [facility] office  Wonda Olds 04/07/2022, 11:18 AM

## 2022-04-07 NOTE — TOC Progression Note (Addendum)
Transition of Care [facility]) - Progression Note    Patient Details  Name: Dawn Gonzales MRN: [id number] Date of Birth: [birth year]  Transition of Care [facility]) CM/SW Contact  Karn Cassis, [place] Phone Number: 04/07/2022, 10:29 AM  Clinical Narrative: Bed offer accepted at [facility] and [facility]. Facility notified. CMA updating auth. Revonda Standard at [facility] aware pt will need cpap.       Expected Discharge Plan: Skilled Nursing Facility Barriers to Discharge: Continued Medical Work up  Expected Discharge Plan and Services Expected Discharge Plan: Skilled Nursing Facility In-house Referral: Clinical Social Work     Living arrangements for the past 2 months: Single Family Home                             HH Agency: [facility]         Social Determinants of Health (SDOH) Interventions    Readmission Risk Interventions     No data to display

## 2022-04-07 NOTE — Progress Notes (Signed)
Occupational Therapy Treatment Patient Details Name: Dawn Gonzales MRN: [id number] DOB: [birth year] Today's Date: 04/07/2022   History of present illness Dawn Gonzales  is a 81 y.o. female, with past medical history of complete heart block, has pacemaker, 3 of severe aortic stenosis, status post TAVR, lymphedema, venous stasis, following with wound clinic and vascular surgery, hyperlipidemia, chronic diastolic CHF, endocarditis, history of CVA, hypertension,.  -Patient ports cough, dyspnea over the last 2 weeks, she has pulse ox at home, for which she noted her oxygen saturation to be in the mid 80s, she denies any dyspnea at rest, but it is exacerbated by activity, was referred to have chest x-ray by her PCP, she was significantly dyspneic, her oxygen saturation noted to be in the mid 70s when she arrived to EMS, she had mild wheezing initially, but this has resolved after receiving IV steroids, patient denies fever, chills, chest pain, hemoptysis, or coffee-ground emesis, she reports chronic lower extremity lymphedema, appears to be stable at her baseline.  - in ED usual saturation was in the mid 70s, but this has improved with oxygen, she had wheezing which has improved with IV steroids, CTA chest negative for PE, but significant for airway inflammation, dyspnea much improved with steroids and nebulizer treatment, BNP was mildly elevated, some vascular congestion noted on imaging as well, so she received Lasix, [facility] hospitalist consulted to admit for new oxygen requirement.   OT comments  Pt agreeable to OT treatment today. Pt stood ~7 reps in total today with labored breathing but able to maintain SpO2% over 90% with 3.5 to 4 L of supplemental O2. Pt required min A to stand from EOB and recliner today with RW and anterior leaning posture. Pt is still generally weak but needing less assist for sit to stand and transfers today. Pt will benefit from continued OT in the hospital and recommended  venue below to increase strength, balance, and endurance for safe ADL's.      Recommendations for follow up therapy are one component of a multi-disciplinary discharge planning process, led by the attending physician.  Recommendations may be updated based on patient status, additional functional criteria and insurance authorization.    Follow Up Recommendations  Skilled nursing-short term rehab (<3 hours/day)     Assistance Recommended at Discharge Intermittent Supervision/Assistance  Patient can return home with the following  A lot of help with walking and/or transfers;A little help with bathing/dressing/bathroom;Assistance with cooking/housework;Assist for transportation;Help with stairs or ramp for entrance   Equipment Recommendations  None recommended by OT    Recommendations for Other Services      Precautions / Restrictions Precautions Precautions: Fall Restrictions Weight Bearing Restrictions: No       Mobility Bed Mobility Overal bed mobility: Modified Independent             General bed mobility comments: Labored effort but able to sit upright with HOB elevated and use or bed rails.    Transfers Overall transfer level: Needs assistance Equipment used: Rolling walker (2 wheels) Transfers: Sit to/from Stand, Bed to chair/wheelchair/BSC Sit to Stand: Min assist     Step pivot transfers: Min assist     General transfer comment: Pt able to complete 7 reps in total of sit to stand, once being from EOB and rest from the recliner. Min A needed to boost from EOB and recliner. Pt able to complete 3 reps in a row within a 2 minute period while maintaining SpO2 above 92% but while on 3.5 to  4 L of supplemental O2.     Balance       Sitting balance - Comments: seated EOB.       Standing balance comment: using RW.  Increased forward flex trunk posture.                            Cognition Arousal/Alertness: Awake/alert Behavior During Therapy:  WFL for tasks assessed/performed Overall Cognitive Status: Within Functional Limits for tasks assessed                                                             Pertinent Vitals/ Pain       Pain Assessment Pain Assessment: No/denies pain                                                          Frequency  Min 2X/week        Progress Toward Goals  OT Goals(current goals can now be found in the care plan section)  Progress towards OT goals: Progressing toward goals  Acute Rehab OT Goals Patient Stated Goal: return home OT Goal Formulation: With patient Time For Goal Achievement: 04/20/22 Potential to Achieve Goals: Good ADL Goals Pt Will Perform Grooming: with modified independence;sitting Pt Will Perform Lower Body Bathing: with modified independence;sitting/lateral leans Pt Will Perform Lower Body Dressing: with modified independence;sitting/lateral leans;with adaptive equipment Pt Will Transfer to Toilet: with modified independence;stand pivot transfer Pt Will Perform Toileting - Clothing Manipulation and hygiene: with modified independence;sitting/lateral leans Pt/caregiver will Perform Home Exercise Program: Increased strength;Both right and left upper extremity;Independently  Plan Discharge plan remains appropriate                                    End of Session Equipment Utilized During Treatment: Rolling walker (2 wheels);Gait belt;Oxygen  OT Visit Diagnosis: Unsteadiness on feet (R26.81);Other abnormalities of gait and mobility (R26.89);Muscle weakness (generalized) (M62.81)   Activity Tolerance Patient tolerated treatment well   Patient Left in chair;with call bell/phone within reach   Nurse Communication Other (comment) (Notified pt was in chair.)        Time: 2585-2778 OT Time Calculation (min): 16 min  Charges: OT General Charges $OT Visit: 1 Visit OT Treatments $Therapeutic  Exercise: 8-22 mins  Kyron Schlitt OT, MOT  Danie Chandler 04/07/2022, 11:37 AM

## 2022-04-10 ENCOUNTER — Ambulatory Visit (INDEPENDENT_AMBULATORY_CARE_PROVIDER_SITE_OTHER): Payer: Medicare PPO

## 2022-04-10 DIAGNOSIS — J9600 Acute respiratory failure, unspecified whether with hypoxia or hypercapnia: Secondary | ICD-10-CM | POA: Diagnosis not present

## 2022-04-10 DIAGNOSIS — R5381 Other malaise: Secondary | ICD-10-CM | POA: Diagnosis not present

## 2022-04-10 DIAGNOSIS — I442 Atrioventricular block, complete: Secondary | ICD-10-CM

## 2022-04-10 DIAGNOSIS — Z950 Presence of cardiac pacemaker: Secondary | ICD-10-CM

## 2022-04-10 DIAGNOSIS — I5032 Chronic diastolic (congestive) heart failure: Secondary | ICD-10-CM | POA: Diagnosis not present

## 2022-04-10 LAB — CULTURE, BLOOD (ROUTINE X 2)
Culture: NO GROWTH
Culture: NO GROWTH
Special Requests: ADEQUATE
Special Requests: ADEQUATE

## 2022-04-11 LAB — CUP PACEART REMOTE DEVICE CHECK
Date Time Interrogation Session: 20231211100111
Implantable Lead Connection Status: 753985
Implantable Lead Connection Status: 753985
Implantable Lead Implant Date: 20200311
Implantable Lead Implant Date: 20200311
Implantable Lead Location: 753859
Implantable Lead Location: 753860
Implantable Lead Model: 377
Implantable Lead Model: 377
Implantable Lead Serial Number: 81024123
Implantable Lead Serial Number: 81075341
Implantable Pulse Generator Implant Date: 20200311
Pulse Gen Model: 407145
Pulse Gen Serial Number: 69550148

## 2022-04-12 DIAGNOSIS — I83028 Varicose veins of left lower extremity with ulcer other part of lower leg: Secondary | ICD-10-CM | POA: Diagnosis not present

## 2022-04-12 DIAGNOSIS — G629 Polyneuropathy, unspecified: Secondary | ICD-10-CM | POA: Diagnosis not present

## 2022-04-12 DIAGNOSIS — I5032 Chronic diastolic (congestive) heart failure: Secondary | ICD-10-CM | POA: Diagnosis not present

## 2022-04-12 DIAGNOSIS — J9611 Chronic respiratory failure with hypoxia: Secondary | ICD-10-CM | POA: Diagnosis not present

## 2022-04-12 DIAGNOSIS — Z9981 Dependence on supplemental oxygen: Secondary | ICD-10-CM | POA: Diagnosis not present

## 2022-04-12 DIAGNOSIS — I890 Lymphedema, not elsewhere classified: Secondary | ICD-10-CM | POA: Diagnosis not present

## 2022-04-12 DIAGNOSIS — I639 Cerebral infarction, unspecified: Secondary | ICD-10-CM | POA: Diagnosis not present

## 2022-04-12 DIAGNOSIS — I83018 Varicose veins of right lower extremity with ulcer other part of lower leg: Secondary | ICD-10-CM | POA: Diagnosis not present

## 2022-04-17 DIAGNOSIS — J9611 Chronic respiratory failure with hypoxia: Secondary | ICD-10-CM | POA: Diagnosis not present

## 2022-04-17 DIAGNOSIS — I10 Essential (primary) hypertension: Secondary | ICD-10-CM | POA: Diagnosis not present

## 2022-04-17 DIAGNOSIS — E785 Hyperlipidemia, unspecified: Secondary | ICD-10-CM | POA: Diagnosis not present

## 2022-04-17 DIAGNOSIS — I5032 Chronic diastolic (congestive) heart failure: Secondary | ICD-10-CM | POA: Diagnosis not present

## 2022-04-18 DIAGNOSIS — I509 Heart failure, unspecified: Secondary | ICD-10-CM | POA: Diagnosis not present

## 2022-04-18 DIAGNOSIS — I10 Essential (primary) hypertension: Secondary | ICD-10-CM | POA: Diagnosis not present

## 2022-04-18 DIAGNOSIS — I5023 Acute on chronic systolic (congestive) heart failure: Secondary | ICD-10-CM | POA: Diagnosis not present

## 2022-04-18 DIAGNOSIS — I110 Hypertensive heart disease with heart failure: Secondary | ICD-10-CM | POA: Diagnosis not present

## 2022-04-18 DIAGNOSIS — Z9981 Dependence on supplemental oxygen: Secondary | ICD-10-CM | POA: Diagnosis not present

## 2022-04-18 DIAGNOSIS — R0600 Dyspnea, unspecified: Secondary | ICD-10-CM | POA: Diagnosis not present

## 2022-04-18 DIAGNOSIS — Z8673 Personal history of transient ischemic attack (TIA), and cerebral infarction without residual deficits: Secondary | ICD-10-CM | POA: Diagnosis not present

## 2022-04-18 DIAGNOSIS — J449 Chronic obstructive pulmonary disease, unspecified: Secondary | ICD-10-CM | POA: Diagnosis not present

## 2022-04-18 DIAGNOSIS — R0902 Hypoxemia: Secondary | ICD-10-CM | POA: Diagnosis not present

## 2022-04-18 DIAGNOSIS — R0602 Shortness of breath: Secondary | ICD-10-CM | POA: Diagnosis not present

## 2022-04-19 DIAGNOSIS — G4733 Obstructive sleep apnea (adult) (pediatric): Secondary | ICD-10-CM | POA: Diagnosis not present

## 2022-04-19 DIAGNOSIS — G629 Polyneuropathy, unspecified: Secondary | ICD-10-CM | POA: Diagnosis not present

## 2022-04-19 DIAGNOSIS — I83028 Varicose veins of left lower extremity with ulcer other part of lower leg: Secondary | ICD-10-CM | POA: Diagnosis not present

## 2022-04-19 DIAGNOSIS — E785 Hyperlipidemia, unspecified: Secondary | ICD-10-CM | POA: Diagnosis not present

## 2022-04-19 DIAGNOSIS — J9611 Chronic respiratory failure with hypoxia: Secondary | ICD-10-CM | POA: Diagnosis not present

## 2022-04-19 DIAGNOSIS — Z9981 Dependence on supplemental oxygen: Secondary | ICD-10-CM | POA: Diagnosis not present

## 2022-04-19 DIAGNOSIS — I639 Cerebral infarction, unspecified: Secondary | ICD-10-CM | POA: Diagnosis not present

## 2022-04-19 DIAGNOSIS — I5032 Chronic diastolic (congestive) heart failure: Secondary | ICD-10-CM | POA: Diagnosis not present

## 2022-04-26 DIAGNOSIS — I5032 Chronic diastolic (congestive) heart failure: Secondary | ICD-10-CM | POA: Diagnosis not present

## 2022-04-26 DIAGNOSIS — J9611 Chronic respiratory failure with hypoxia: Secondary | ICD-10-CM | POA: Diagnosis not present

## 2022-04-26 DIAGNOSIS — G4733 Obstructive sleep apnea (adult) (pediatric): Secondary | ICD-10-CM | POA: Diagnosis not present

## 2022-04-26 DIAGNOSIS — Z9981 Dependence on supplemental oxygen: Secondary | ICD-10-CM | POA: Diagnosis not present

## 2022-05-03 DIAGNOSIS — I83028 Varicose veins of left lower extremity with ulcer other part of lower leg: Secondary | ICD-10-CM | POA: Diagnosis not present

## 2022-05-03 DIAGNOSIS — L89133 Pressure ulcer of right lower back, stage 3: Secondary | ICD-10-CM | POA: Diagnosis not present

## 2022-05-03 DIAGNOSIS — I639 Cerebral infarction, unspecified: Secondary | ICD-10-CM | POA: Diagnosis not present

## 2022-05-03 DIAGNOSIS — G629 Polyneuropathy, unspecified: Secondary | ICD-10-CM | POA: Diagnosis not present

## 2022-05-09 ENCOUNTER — Other Ambulatory Visit: Payer: Self-pay

## 2022-05-09 ENCOUNTER — Encounter (HOSPITAL_COMMUNITY): Payer: Self-pay | Admitting: Emergency Medicine

## 2022-05-09 ENCOUNTER — Emergency Department (HOSPITAL_COMMUNITY): Payer: Medicare PPO

## 2022-05-09 ENCOUNTER — Inpatient Hospital Stay (HOSPITAL_COMMUNITY)
Admission: EM | Admit: 2022-05-09 | Discharge: 2022-05-12 | DRG: 189 | Disposition: A | Discharge status: Skilled Nursing Facility | Payer: Medicare PPO | Attending: Family Medicine | Admitting: Family Medicine

## 2022-05-09 DIAGNOSIS — Z8673 Personal history of transient ischemic attack (TIA), and cerebral infarction without residual deficits: Secondary | ICD-10-CM

## 2022-05-09 DIAGNOSIS — D649 Anemia, unspecified: Secondary | ICD-10-CM | POA: Diagnosis present

## 2022-05-09 DIAGNOSIS — Z9981 Dependence on supplemental oxygen: Secondary | ICD-10-CM | POA: Diagnosis not present

## 2022-05-09 DIAGNOSIS — L89312 Pressure ulcer of right buttock, stage 2: Secondary | ICD-10-CM | POA: Diagnosis present

## 2022-05-09 DIAGNOSIS — E785 Hyperlipidemia, unspecified: Secondary | ICD-10-CM | POA: Diagnosis present

## 2022-05-09 DIAGNOSIS — Z8249 Family history of ischemic heart disease and other diseases of the circulatory system: Secondary | ICD-10-CM

## 2022-05-09 DIAGNOSIS — R296 Repeated falls: Secondary | ICD-10-CM | POA: Diagnosis present

## 2022-05-09 DIAGNOSIS — Z8679 Personal history of other diseases of the circulatory system: Secondary | ICD-10-CM | POA: Diagnosis not present

## 2022-05-09 DIAGNOSIS — G4733 Obstructive sleep apnea (adult) (pediatric): Secondary | ICD-10-CM | POA: Diagnosis not present

## 2022-05-09 DIAGNOSIS — R0600 Dyspnea, unspecified: Secondary | ICD-10-CM | POA: Diagnosis not present

## 2022-05-09 DIAGNOSIS — J449 Chronic obstructive pulmonary disease, unspecified: Secondary | ICD-10-CM | POA: Diagnosis present

## 2022-05-09 DIAGNOSIS — L89313 Pressure ulcer of right buttock, stage 3: Secondary | ICD-10-CM | POA: Diagnosis present

## 2022-05-09 DIAGNOSIS — R0689 Other abnormalities of breathing: Secondary | ICD-10-CM | POA: Diagnosis not present

## 2022-05-09 DIAGNOSIS — Z1152 Encounter for screening for COVID-19: Secondary | ICD-10-CM

## 2022-05-09 DIAGNOSIS — J9621 Acute and chronic respiratory failure with hypoxia: Principal | ICD-10-CM | POA: Diagnosis present

## 2022-05-09 DIAGNOSIS — I5032 Chronic diastolic (congestive) heart failure: Secondary | ICD-10-CM | POA: Diagnosis present

## 2022-05-09 DIAGNOSIS — R5381 Other malaise: Secondary | ICD-10-CM | POA: Diagnosis present

## 2022-05-09 DIAGNOSIS — E669 Obesity, unspecified: Secondary | ICD-10-CM | POA: Diagnosis present

## 2022-05-09 DIAGNOSIS — G4730 Sleep apnea, unspecified: Secondary | ICD-10-CM | POA: Diagnosis present

## 2022-05-09 DIAGNOSIS — Z953 Presence of xenogenic heart valve: Secondary | ICD-10-CM | POA: Diagnosis not present

## 2022-05-09 DIAGNOSIS — Z79899 Other long term (current) drug therapy: Secondary | ICD-10-CM

## 2022-05-09 DIAGNOSIS — R0682 Tachypnea, not elsewhere classified: Secondary | ICD-10-CM | POA: Diagnosis not present

## 2022-05-09 DIAGNOSIS — J9612 Chronic respiratory failure with hypercapnia: Secondary | ICD-10-CM | POA: Diagnosis present

## 2022-05-09 DIAGNOSIS — Z6841 Body Mass Index (BMI) 40.0 and over, adult: Secondary | ICD-10-CM

## 2022-05-09 DIAGNOSIS — I5033 Acute on chronic diastolic (congestive) heart failure: Secondary | ICD-10-CM | POA: Diagnosis present

## 2022-05-09 DIAGNOSIS — Z8261 Family history of arthritis: Secondary | ICD-10-CM | POA: Diagnosis not present

## 2022-05-09 DIAGNOSIS — R069 Unspecified abnormalities of breathing: Secondary | ICD-10-CM | POA: Diagnosis not present

## 2022-05-09 DIAGNOSIS — J9601 Acute respiratory failure with hypoxia: Secondary | ICD-10-CM | POA: Diagnosis present

## 2022-05-09 DIAGNOSIS — I509 Heart failure, unspecified: Secondary | ICD-10-CM

## 2022-05-09 DIAGNOSIS — J9611 Chronic respiratory failure with hypoxia: Secondary | ICD-10-CM | POA: Diagnosis not present

## 2022-05-09 DIAGNOSIS — I959 Hypotension, unspecified: Secondary | ICD-10-CM | POA: Diagnosis not present

## 2022-05-09 DIAGNOSIS — I83009 Varicose veins of unspecified lower extremity with ulcer of unspecified site: Secondary | ICD-10-CM | POA: Diagnosis present

## 2022-05-09 DIAGNOSIS — E662 Morbid (severe) obesity with alveolar hypoventilation: Secondary | ICD-10-CM | POA: Diagnosis present

## 2022-05-09 DIAGNOSIS — I110 Hypertensive heart disease with heart failure: Secondary | ICD-10-CM | POA: Diagnosis present

## 2022-05-09 DIAGNOSIS — J9811 Atelectasis: Secondary | ICD-10-CM | POA: Diagnosis present

## 2022-05-09 DIAGNOSIS — G629 Polyneuropathy, unspecified: Secondary | ICD-10-CM | POA: Diagnosis present

## 2022-05-09 DIAGNOSIS — I350 Nonrheumatic aortic (valve) stenosis: Secondary | ICD-10-CM | POA: Diagnosis present

## 2022-05-09 DIAGNOSIS — I10 Essential (primary) hypertension: Secondary | ICD-10-CM | POA: Diagnosis not present

## 2022-05-09 DIAGNOSIS — L8990 Pressure ulcer of unspecified site, unspecified stage: Secondary | ICD-10-CM | POA: Diagnosis present

## 2022-05-09 DIAGNOSIS — I890 Lymphedema, not elsewhere classified: Secondary | ICD-10-CM | POA: Diagnosis not present

## 2022-05-09 DIAGNOSIS — K5900 Constipation, unspecified: Secondary | ICD-10-CM | POA: Diagnosis not present

## 2022-05-09 DIAGNOSIS — I878 Other specified disorders of veins: Secondary | ICD-10-CM | POA: Diagnosis present

## 2022-05-09 DIAGNOSIS — R531 Weakness: Secondary | ICD-10-CM | POA: Diagnosis present

## 2022-05-09 DIAGNOSIS — Z952 Presence of prosthetic heart valve: Secondary | ICD-10-CM

## 2022-05-09 DIAGNOSIS — Z602 Problems related to living alone: Secondary | ICD-10-CM | POA: Diagnosis present

## 2022-05-09 DIAGNOSIS — R279 Unspecified lack of coordination: Secondary | ICD-10-CM | POA: Diagnosis not present

## 2022-05-09 DIAGNOSIS — Z7982 Long term (current) use of aspirin: Secondary | ICD-10-CM

## 2022-05-09 DIAGNOSIS — F419 Anxiety disorder, unspecified: Secondary | ICD-10-CM | POA: Diagnosis present

## 2022-05-09 DIAGNOSIS — Z7401 Bed confinement status: Secondary | ICD-10-CM | POA: Diagnosis not present

## 2022-05-09 DIAGNOSIS — F32A Depression, unspecified: Secondary | ICD-10-CM | POA: Diagnosis present

## 2022-05-09 DIAGNOSIS — Z9181 History of falling: Secondary | ICD-10-CM

## 2022-05-09 DIAGNOSIS — L989 Disorder of the skin and subcutaneous tissue, unspecified: Secondary | ICD-10-CM | POA: Diagnosis present

## 2022-05-09 DIAGNOSIS — I872 Venous insufficiency (chronic) (peripheral): Secondary | ICD-10-CM | POA: Diagnosis present

## 2022-05-09 LAB — BLOOD GAS, VENOUS
Acid-Base Excess: 17.7 mmol/L — ABNORMAL HIGH (ref 0.0–2.0)
Bicarbonate: 41.2 mmol/L — ABNORMAL HIGH (ref 20.0–28.0)
Drawn by: 7478
O2 Saturation: 78.6 %
Patient temperature: 36.5
pCO2, Ven: 41 mmHg — ABNORMAL LOW (ref 44–60)
pH, Ven: 7.61 (ref 7.25–7.43)
pO2, Ven: 42 mmHg (ref 32–45)

## 2022-05-09 LAB — COMPREHENSIVE METABOLIC PANEL WITH GFR
ALT: 19 U/L (ref 0–44)
AST: 26 U/L (ref 15–41)
Albumin: 3.1 g/dL — ABNORMAL LOW (ref 3.5–5.0)
Alkaline Phosphatase: 75 U/L (ref 38–126)
Anion gap: 13 (ref 5–15)
BUN: 26 mg/dL — ABNORMAL HIGH (ref 8–23)
CO2: 34 mmol/L — ABNORMAL HIGH (ref 22–32)
Calcium: 9.6 mg/dL (ref 8.9–10.3)
Chloride: 93 mmol/L — ABNORMAL LOW (ref 98–111)
Creatinine, Ser: 1 mg/dL (ref 0.44–1.00)
GFR, Estimated: 57 mL/min — ABNORMAL LOW
Glucose, Bld: 118 mg/dL — ABNORMAL HIGH (ref 70–99)
Potassium: 3.6 mmol/L (ref 3.5–5.1)
Sodium: 140 mmol/L (ref 135–145)
Total Bilirubin: 0.5 mg/dL (ref 0.3–1.2)
Total Protein: 6.9 g/dL (ref 6.5–8.1)

## 2022-05-09 LAB — CBC WITH DIFFERENTIAL/PLATELET
Abs Immature Granulocytes: 0.05 10*3/uL (ref 0.00–0.07)
Basophils Absolute: 0.1 10*3/uL (ref 0.0–0.1)
Basophils Relative: 1 %
Eosinophils Absolute: 0.1 10*3/uL (ref 0.0–0.5)
Eosinophils Relative: 2 %
HCT: 38.1 % (ref 36.0–46.0)
Hemoglobin: 12.3 g/dL (ref 12.0–15.0)
Immature Granulocytes: 1 %
Lymphocytes Relative: 21 %
Lymphs Abs: 1.4 10*3/uL (ref 0.7–4.0)
MCH: 29.4 pg (ref 26.0–34.0)
MCHC: 32.3 g/dL (ref 30.0–36.0)
MCV: 91.1 fL (ref 80.0–100.0)
Monocytes Absolute: 0.5 10*3/uL (ref 0.1–1.0)
Monocytes Relative: 8 %
Neutro Abs: 4.5 10*3/uL (ref 1.7–7.7)
Neutrophils Relative %: 67 %
Platelets: 228 10*3/uL (ref 150–400)
RBC: 4.18 MIL/uL (ref 3.87–5.11)
RDW: 16.4 % — ABNORMAL HIGH (ref 11.5–15.5)
WBC: 6.7 10*3/uL (ref 4.0–10.5)
nRBC: 0 % (ref 0.0–0.2)

## 2022-05-09 LAB — RESP PANEL BY RT-PCR (RSV, FLU A&B, COVID)  RVPGX2
Influenza A by PCR: NEGATIVE
Influenza B by PCR: NEGATIVE
Resp Syncytial Virus by PCR: NEGATIVE
SARS Coronavirus 2 by RT PCR: NEGATIVE

## 2022-05-09 LAB — TROPONIN I (HIGH SENSITIVITY)
Troponin I (High Sensitivity): 17 ng/L (ref <18)
Troponin I (High Sensitivity): 16 ng/L (ref <18)

## 2022-05-09 LAB — BRAIN NATRIURETIC PEPTIDE: B Natriuretic Peptide: 86 pg/mL (ref 0.0–100.0)

## 2022-05-09 MED ORDER — METHYLPREDNISOLONE SODIUM SUCC 125 MG IJ SOLR
125.0000 mg | Freq: Once | INTRAMUSCULAR | Status: AC
  Administered 2022-05-09: 125 mg via INTRAVENOUS
  Filled 2022-05-09: qty 2

## 2022-05-09 MED ORDER — TORSEMIDE 20 MG PO TABS
40.0000 mg | ORAL_TABLET | Freq: Every day | ORAL | Status: DC
  Administered 2022-05-10 – 2022-05-12 (×3): 40 mg via ORAL
  Filled 2022-05-09 (×3): qty 2

## 2022-05-09 MED ORDER — POLYETHYLENE GLYCOL 3350 17 G PO PACK: 17.0000 g | PACK | Freq: Every day | ORAL | Status: DC | PRN

## 2022-05-09 MED ORDER — VENLAFAXINE HCL 37.5 MG PO TABS
37.5000 mg | ORAL_TABLET | Freq: Every day | ORAL | Status: DC
  Administered 2022-05-10 – 2022-05-12 (×3): 37.5 mg via ORAL
  Filled 2022-05-09 (×3): qty 1

## 2022-05-09 MED ORDER — MAGNESIUM OXIDE -MG SUPPLEMENT 400 (240 MG) MG PO TABS: 800.0000 mg | ORAL_TABLET | Freq: Every day | ORAL | Status: DC

## 2022-05-09 MED ORDER — ACETAMINOPHEN 650 MG RE SUPP: 650.0000 mg | Freq: Four times a day (QID) | RECTAL | Status: DC | PRN

## 2022-05-09 MED ORDER — IPRATROPIUM-ALBUTEROL 0.5-2.5 (3) MG/3ML IN SOLN
3.0000 mL | Freq: Two times a day (BID) | RESPIRATORY_TRACT | Status: DC
  Administered 2022-05-10 (×3): 3 mL via RESPIRATORY_TRACT
  Filled 2022-05-09 (×3): qty 3

## 2022-05-09 MED ORDER — ENOXAPARIN SODIUM 40 MG/0.4ML IJ SOSY
40.0000 mg | PREFILLED_SYRINGE | INTRAMUSCULAR | Status: DC
  Administered 2022-05-10 – 2022-05-12 (×3): 40 mg via SUBCUTANEOUS
  Filled 2022-05-09 (×4): qty 0.4

## 2022-05-09 MED ORDER — GABAPENTIN 300 MG PO CAPS
300.0000 mg | ORAL_CAPSULE | Freq: Three times a day (TID) | ORAL | Status: DC
  Administered 2022-05-10 – 2022-05-12 (×7): 300 mg via ORAL
  Filled 2022-05-09 (×7): qty 1

## 2022-05-09 MED ORDER — ACETAMINOPHEN 325 MG PO TABS: 650.0000 mg | ORAL_TABLET | Freq: Four times a day (QID) | ORAL | Status: DC | PRN

## 2022-05-09 MED ORDER — ALBUTEROL SULFATE (2.5 MG/3ML) 0.083% IN NEBU: 2.5000 mg | INHALATION_SOLUTION | RESPIRATORY_TRACT | Status: DC | PRN

## 2022-05-09 MED ORDER — IPRATROPIUM-ALBUTEROL 0.5-2.5 (3) MG/3ML IN SOLN
3.0000 mL | Freq: Once | RESPIRATORY_TRACT | Status: AC
  Administered 2022-05-09: 3 mL via RESPIRATORY_TRACT
  Filled 2022-05-09: qty 3

## 2022-05-09 NOTE — ED Notes (Signed)
Pt's daughter was updated. [employer] RN

## 2022-05-09 NOTE — Assessment & Plan Note (Addendum)
Stable and compensated.  Recent echo 10/2021, EF 55 to 60%, G1DD. -Resume home torsemide 40 mg daily

## 2022-05-09 NOTE — Assessment & Plan Note (Signed)
Physical deconditioning likely etiology for generalized weakness, falls.  She lives alone. -PT eval

## 2022-05-09 NOTE — Assessment & Plan Note (Signed)
On 3 L chronically.  O2 sats initially checked was 80% but on room air.  On my evaluation sats > 95% on 3 L, status post on BiPAP, Solu-Medrol, and Nebs, he is calm without signs of respiratory distress at this time.  Chest x-ray showing-progression of RLL atelectasis.  Likely secondary to OSA/OHS considering habitus. -Continue CPAP nightly, may need to use CPAP intermittently during the day -Albuterol nebs as needed

## 2022-05-09 NOTE — Assessment & Plan Note (Signed)
Stable

## 2022-05-09 NOTE — Assessment & Plan Note (Signed)
Stage 2 Decubitus ulcer to right buttock/sacral area.  No evidence of surrounding cellulitis. -Wound care consult

## 2022-05-09 NOTE — ED Provider Notes (Signed)
Dawn Gonzales   CSN: Dawn Gonzales Arrival date & time: 05/09/22  1011     History  Chief Complaint  Patient presents with   Shortness of Breath    Dawn Gonzales is a 82 y.o. female. She of CHF and COPD, EMS reports she is on 3L of oxygen chronically today and 5 L at night.  She was discharged from [facility] and rehab 2 days ago.  Reported they brought her in for shortness of breath though patient states that her biggest concern is that she does not feel like she is ready to go home from rehab.  She states that she been falling a lot and having generalized weakness since coming home.  This is unchanged since rehab.  She complains of some mild pain to the right buttocks from an ulceration that she states developed while in the rehab facility. Her daughter reports her oxygent when she came home was 92% on 3 L has generally declined, today it was in the mid 42s on her home 3 L.  Shortness of Breath      Home Medications Prior to Admission medications   Medication Sig Start Date End Date Taking? Authorizing Provider  acetaminophen (TYLENOL) 325 MG tablet Take 2 tablets (650 mg total) by mouth every 6 (six) hours as needed for mild pain, fever or headache (or Fever >/= 101). 07/10/19   Roxan Hockey, MD  albuterol (VENTOLIN HFA) 108 (90 Base) MCG/ACT inhaler Inhale 2 puffs into the lungs every 6 (six) hours as needed. 10/24/21   [provider]  aspirin EC 81 MG tablet Take 1 tablet (81 mg total) by mouth daily with breakfast. 07/10/19   Roxan Hockey, MD  Calcium Carbonate (CALCIUM-CARB 600 PO) Take 600 mg by mouth daily.    [provider]  cholecalciferol (VITAMIN D) 1000 units tablet Take 1,000 Units by mouth daily.    [provider]  ferrous sulfate 325 (65 FE) MG tablet Take 325 mg by mouth daily with breakfast.    [provider]  gabapentin (NEURONTIN) 300 MG capsule Take 300 mg by mouth 3 (three) times  daily.    [provider]  ipratropium-albuterol (DUONEB) 0.5-2.5 (3) MG/3ML SOLN Take 3 mLs by nebulization 2 (two) times daily. 04/07/22   Deatra James, MD  methylPREDNISolone (MEDROL DOSEPAK) 4 MG TBPK tablet Medrol Dosepak take as instructed 04/07/22   Deatra James, MD  Multiple Vitamin (MULTIVITAMIN WITH MINERALS) TABS tablet Take 1 tablet by mouth daily.    [provider]  nystatin (MYCOSTATIN/NYSTOP) powder APPLY TO AFFECTED AREA TWICE A DAY    [provider]  polyethylene glycol (MIRALAX MIX-IN PAX) 17 g packet Take 17 g by mouth daily as needed for mild constipation or moderate constipation. 07/10/19   Roxan Hockey, MD  pravastatin (PRAVACHOL) 20 MG tablet Take 20 mg by mouth daily.    [provider]  torsemide (DEMADEX) 20 MG tablet Take 40 mg by mouth daily.    [provider]  venlafaxine (EFFEXOR) 37.5 MG tablet Take 37.5 mg by mouth daily.    [provider]  vitamin C (ASCORBIC ACID) 500 MG tablet Take 500 mg by mouth daily.    [provider]      Allergies    Patient has no known allergies.    Review of Systems   Review of Systems  Respiratory:  Positive for shortness of breath.     Physical Exam Updated Vital  Signs There were no vitals taken for this visit. Physical Exam Vitals and nursing Gonzales reviewed.  Constitutional:      General: She is not in acute distress.    Appearance: She is well-developed.  HENT:     Head: Normocephalic and atraumatic.  Eyes:     Conjunctiva/sclera: Conjunctivae normal.  Cardiovascular:     Rate and Rhythm: Normal rate and regular rhythm.     Heart sounds: No murmur heard. Pulmonary:     Effort: Pulmonary effort is normal. No respiratory distress.     Breath sounds: Examination of the right-lower field reveals decreased breath sounds. Examination of the left-lower field reveals decreased breath sounds. Decreased breath sounds present. No wheezing, rhonchi  or rales.  Abdominal:     Palpations: Abdomen is soft.     Tenderness: There is no abdominal tenderness.  Musculoskeletal:        General: No swelling. Normal range of motion.     Cervical back: Neck supple.     Right lower leg: Edema present.     Left lower leg: Edema present.  Skin:    General: Skin is warm and dry.     Capillary Refill: Capillary refill takes less than 2 seconds.  Neurological:     Mental Status: She is alert.  Psychiatric:        Mood and Affect: Mood normal.    Ulceration to right buttock/sacral area as below  ED Results / Procedures / Treatments   Labs (all labs ordered are listed, but only abnormal results are displayed) Labs Reviewed  RESP PANEL BY RT-PCR (RSV, FLU A&B, COVID)  [id number]  COMPREHENSIVE METABOLIC PANEL  CBC WITH DIFFERENTIAL/PLATELET  BLOOD GAS, VENOUS  BRAIN NATRIURETIC PEPTIDE  TROPONIN I (HIGH SENSITIVITY)    EKG None  Radiology No results found.  Procedures .Critical Care  Performed by: Gwenevere Abbot, PA-C Authorized by: Gwenevere Abbot, PA-C   Critical care provider statement:    Critical care time (minutes):  30   Critical care was necessary to treat or prevent imminent or life-threatening deterioration of the following conditions:  Respiratory failure   Critical care was time spent personally by me on the following activities:  Development of treatment plan with patient or surrogate, discussions with consultants, evaluation of patient's response to treatment, examination of patient, ordering and review of laboratory studies, ordering and review of radiographic studies, ordering and performing treatments and interventions, pulse oximetry, re-evaluation of patient's condition, review of old charts and obtaining history from patient or surrogate   Care discussed with: admitting provider       Medications Ordered in ED Medications - No data to display  ED Course/ Medical Decision Making/ A&P                            Medical Decision Making This patient presents to the ED for concern of shortness of breath low oxygen saturation, this involves an extensive number of treatment options, and is a complaint that carries with it a high risk of complications and morbidity.  The differential diagnosis includes pneumonia, COPD exacerbation, CHF exacerbation, other   Co morbidities that complicate the patient evaluation  CHF, COPD, obesity, sleep apnea   Additional history obtained:  Additional history obtained from EMR External records from outside source obtained and reviewed including prior hospitalization, discharge summary   Lab Tests:  I Ordered, and personally interpreted labs.  The pertinent results include: Normal  CBC, CMP, troponin, BNP  Imaging Studies ordered:  I ordered imaging studies including chest x-ray I independently visualized and interpreted imaging which showed lower lobe atelectasis, no infiltrate or significant pulmonary edema I agree with the radiologist interpretation   Cardiac Monitoring: / EKG:  The patient was maintained on a cardiac monitor.  I personally viewed and interpreted the cardiac monitored which showed an underlying rhythm of: Sinus rhythm   Consultations Obtained:  I requested consultation with the hospitalist Dr. Mariea Clonts,  and discussed lab and imaging findings as well as pertinent plan - they recommend: addmision   Problem List / ED Course / Critical interventions / Medication management  Hypoxic respiratory failure-increased oxygen requirement.  Today with increased oxygen requirement from home.  She was recently discharged from the nursing home.  I spoke with the patient and EMS as well as the patient's daughter via the telephone.  She was discharged home on home oxygen 3 L during the day and 5 L at night.  She suspected to have obstructive sleep apnea but insurance will allow CPAP to be ordered without formal sleep study.  Patient was doing well with  CPAP at night in the hospital and nursing home but has been having decreasing oxygen at home without the CPAP.  She also states she feels generally weak and has been having increased difficulty with ambulation since she got home.  Reports she developed a sacral ulcer while nursing home.  Her daughter reports that I ordered medication including albuterol  for sob  Reevaluation of the patient after these medicines showed that the patient improved I have reviewed the patients home medicines and have made adjustments as needed          Amount and/or Complexity of Data Reviewed Labs: ordered. Radiology: ordered.  Risk Prescription drug management. Decision regarding hospitalization.           Final Clinical Impression(s) / ED Diagnoses Final diagnoses:  None    Rx / DC Orders ED Discharge Orders     None         Josem Kaufmann 05/09/22 1834    Glendora Score, MD 05/09/22 2002

## 2022-05-09 NOTE — ED Notes (Signed)
Date and time results received: 05/09/22 1208  (use smartphrase ".now" to insert current time)  Test: pH  Critical Value: 7.61  Name of Provider Notified: Dr, Matilde Sprang  Orders Received? Or Actions Taken?:  advised. No new orders at this time.

## 2022-05-09 NOTE — ED Notes (Signed)
Pt alert and oriented upon this RN's assessment.  She is excited that her oxygen level has improved. Dawn Gonzales

## 2022-05-09 NOTE — ED Triage Notes (Signed)
Pt  comes for CHF exacerbations went to rehab but went back home.Not using cpap at night like she was in hospital/facility. Over last few days, become more SOB and weak. Per EMS, Bilateral rales, after cpap, only in bases, not in upper. No pain she states, only tachypnea. A&O x 3, color WNL. 80% O2 @ RA on triage, Cpap applied by RT, and 02 is 100%

## 2022-05-09 NOTE — Assessment & Plan Note (Signed)
Resume CPAP 

## 2022-05-09 NOTE — H&P (Signed)
History and Physical    Dawn Gonzales:[id number] DOB: Apr 12, 1941 DOA: 05/09/2022  PCP: Benita Stabile, MD   Patient coming from: Home  I have personally briefly reviewed patient's old medical records in [facility]  Chief Complaint: Weakness, Falls  HPI: Dawn Gonzales is a 82 y.o. female with medical history significant for TAVR, complete heart block, diastolic CHF, hypertension, on chronic 3 L of O2. Patient presented to the ED with complaints of weakness and falling.  Per chart notes he also reported difficulty breathing, but patient tells me her breathing is fine.  She reports she has fallen 3 times since she was discharged from nursing home 2 days ago.  She reports she felt weak after rehab stay, did not feel ready to go back home.  She lives alone..  She tells me she developed a pressure ulcer on her buttock during her rehab stay also also, ulcer is without drainage. She denies cough, no chest pain.  No lower extremity swelling.  She has been compliant with her torsemide.  She is on CPAP nightly and is pleased with nightly since she was discharged from nursing home.  Recent hospitalization 12/6 to 12/8 for decompensated CHF.  ED Course: O2 sats 80% on room air, she was initially placed on BiPAP.  Respiratory rate 16-27.  Blood pressure systolic 120s to 601U. Chest x-ray -question of right lower lobe atelectasis.  And improvement in vascular congestion. Solu-Medrol 125 mg, DuoNebs given in ED.  Hospitalist admit for acute hypoxic respiratory failure.  Review of Systems: As per HPI all other systems reviewed and negative.  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   26 mm Edwards Sapien 3 transcatheter heart valve placed via percutaneous right transfemoral approach    Severe aortic stenosis    Sleep apnea     Past Surgical History:  Procedure Laterality Date   Abdominal gangrene     ADENOIDECTOMY     Cataract surgery     COLONOSCOPY     ENDOSCOPIC RETROGRADE CHOLANGIOPANCREATOGRAPHY (ERCP) WITH PROPOFOL N/A 02/08/2018   Procedure: ENDOSCOPIC RETROGRADE CHOLANGIOPANCREATOGRAPHY (ERCP) WITH PROPOFOL;  Surgeon: Lemar Lofty., MD;  Location: [facility] ENDOSCOPY;  Service: Gastroenterology;  Laterality: N/A;   EUS  02/08/2018   Procedure: UPPER ENDOSCOPIC ULTRASOUND (EUS) LINEAR;  Surgeon: Meridee Score Netty Starring., MD;  Location: [facility] ENDOSCOPY;  Service: Gastroenterology;;   EYE SURGERY     IR FLUORO RM 30-60 MIN  03/27/2018   IR PERC CHOLECYSTOSTOMY  02/09/2018   IR RADIOLOGIST EVAL & MGMT  03/26/2018   IR RADIOLOGY PERIPHERAL GUIDED IV START  05/22/2018   IR RADIOLOGY PERIPHERAL GUIDED IV START  05/30/2018   IR RADIOLOGY PERIPHERAL GUIDED IV START  06/05/2018   IR US GUIDE VASC ACCESS RIGHT  05/22/2018   IR US GUIDE VASC ACCESS RIGHT  05/30/2018   IR US GUIDE VASC ACCESS RIGHT  06/05/2018   PACEMAKER IMPLANT N/A 07/10/2018   Procedure: PACEMAKER IMPLANT;  Surgeon:  Marinus Maw, MD;  Location: [facility] INVASIVE CV LAB;  Service: Cardiovascular;  Laterality: N/A;   REMOVAL OF STONES  02/08/2018   Procedure: REMOVAL OF STONES;  Surgeon: Meridee Score Netty Starring., MD;  Location: [facility] ENDOSCOPY;  Service: Gastroenterology;;   Dennison Mascot  02/08/2018   Procedure: Dennison Mascot;  Surgeon: Mansouraty, Netty Starring., MD;  Location: [facility] ENDOSCOPY;  Service: Gastroenterology;;   TONSILLECTOMY     TOOTH EXTRACTION N/A 06/18/2018   Procedure: DENTAL RESTORATION/EXTRACTIONS;  Surgeon: Ocie Doyne, DDS;  Location: [facility] OR;  Service: Oral Surgery;  Laterality: N/A;    TRANSCATHETER AORTIC VALVE REPLACEMENT, TRANSFEMORAL N/A 07/09/2018   Procedure: TRANSCATHETER AORTIC VALVE REPLACEMENT, TRANSFEMORAL;  Surgeon: Tonny Bollman, MD;  Location: [facility] INVASIVE CV LAB;  Service: Cardiovascular;  Laterality: N/A;   Tummy tuck       reports that she quit smoking about 43 years ago. Her smoking use included cigarettes. She has never been exposed to tobacco smoke. She has never used smokeless tobacco. She reports that she does not currently use alcohol. She reports that she does not use drugs.  No Known Allergies  Family History  Problem Relation Age of Onset   Cancer Mother    Hypertension Father    Arthritis Father    Heart attack Father     Prior to Admission medications   Medication Sig Start Date End Date Taking? Authorizing Provider  acetaminophen (TYLENOL) 325 MG tablet Take 2 tablets (650 mg total) by mouth every 6 (six) hours as needed for mild pain, fever or headache (or Fever >/= 101). 07/10/19  Yes Constanza Mincy, Courage, MD  albuterol (VENTOLIN HFA) 108 (90 Base) MCG/ACT inhaler Inhale 2 puffs into the lungs every 6 (six) hours as needed. 10/24/21  Yes [provider]  Calcium Carbonate (CALCIUM-CARB 600 PO) Take 600 mg by mouth daily.   Yes [provider]  cholecalciferol (VITAMIN D) 1000 units tablet Take 1,000 Units by mouth daily.   Yes [provider]  ferrous sulfate 325 (65 FE) MG tablet Take 325 mg by mouth daily with breakfast.   Yes [provider]  gabapentin (NEURONTIN) 300 MG capsule Take 300 mg by mouth 3 (three) times daily.   Yes [provider]  ipratropium-albuterol (DUONEB) 0.5-2.5 (3) MG/3ML SOLN Take 3 mLs by nebulization 2 (two) times daily. 04/07/22  Yes Shahmehdi, Gemma Payor, MD  Multiple Vitamin (MULTIVITAMIN WITH MINERALS) TABS tablet Take 1 tablet by mouth daily.   Yes [provider]  nystatin (MYCOSTATIN/NYSTOP) powder APPLY TO AFFECTED AREA TWICE A DAY   Yes [provider]  polyethylene glycol (MIRALAX MIX-IN PAX) 17 g packet Take 17 g by mouth daily as needed for mild constipation or moderate constipation. 07/10/19  Yes Shylie Polo, Courage, MD  pravastatin (PRAVACHOL) 20 MG tablet Take 20 mg by mouth daily.   Yes [provider]  torsemide (DEMADEX) 20 MG tablet Take 40 mg by mouth daily.   Yes [provider]  venlafaxine (EFFEXOR) 37.5 MG tablet Take 37.5 mg by mouth daily.   Yes [provider]  vitamin C (ASCORBIC ACID) 500 MG tablet Take 500 mg by mouth daily.   Yes [provider]  aspirin EC 81 MG tablet Take 1 tablet (81 mg total) by mouth daily with breakfast. Patient not taking: Reported on 05/09/2022 07/10/19   Shon Hale, MD  methylPREDNISolone (MEDROL DOSEPAK) 4 MG TBPK tablet Medrol Dosepak take as instructed Patient not taking: Reported on 05/09/2022 04/07/22   Kendell Bane, MD  Physical Exam: Vitals:   05/09/22 1430 05/09/22 1500 05/09/22 1510 05/09/22 1600  BP: 126/66 (!) 146/80  131/73  Pulse: 78 81 77 72  Resp: 20 (!) 25 (!) 22 18  Temp:   98.4 F (36.9 C) 98.2 F (36.8 C)  TempSrc:   Oral Oral  SpO2: 95% 98% 95% 100%    Constitutional: NAD, calm, comfortable Vitals:   05/09/22 1430 05/09/22 1500 05/09/22 1510 05/09/22 1600  BP: 126/66 (!) 146/80  131/73  Pulse: 78 81 77 72  Resp: 20 (!) 25 (!) 22 18  Temp:   98.4 F (36.9 C) 98.2 F (36.8 C)  TempSrc:   Oral Oral  SpO2: 95% 98% 95% 100%   Eyes: PERRL, lids and conjunctivae normal ENMT: Mucous membranes are moist.  Neck: normal, supple, no masses, no thyromegaly Respiratory: clear to auscultation bilaterally, no wheezing, no crackles.  No accessory muscle use, normal work of breathing. Cardiovascular: Regular rate and rhythm, no murmurs / rubs / gallops. No extremity edema.  Abdomen: no tenderness, no masses palpated. No hepatosplenomegaly. Bowel sounds positive.  Musculoskeletal: no clubbing / cyanosis. No joint  deformity upper and lower extremities. Skin: Venous stasis changes to bilateral lower extremities, .  Stage II sacral decubitus ulcer to right buttock.  Chronic scarring and discoloration, no surrounding evidence of cellulitis. Neurologic: Unable to lift bilateral lower extremity against gravity,.  Moving upper extremities spontaneously. Psychiatric: Normal judgment and insight. Alert and oriented x 3. Normal mood.        Labs on Admission: I have personally reviewed following labs and imaging studies  CBC: Recent Labs  Lab 05/09/22 1132  WBC 6.7  NEUTROABS 4.5  HGB 12.3  HCT 38.1  MCV 91.1  PLT 027   Basic Metabolic Panel: Recent Labs  Lab 05/09/22 1132  NA 140  K 3.6  CL 93*  CO2 34*  GLUCOSE 118*  BUN 26*  CREATININE 1.00  CALCIUM 9.6   GFR: CrCl cannot be calculated (Unknown ideal weight.). Liver Function Tests: Recent Labs  Lab 05/09/22 1132  AST 26  ALT 19  ALKPHOS 75  BILITOT 0.5  PROT 6.9  ALBUMIN 3.1*    Radiological Exams on Admission: DG Chest Portable 1 View  Result Date: 05/09/2022 CLINICAL DATA:  Dyspnea EXAM: PORTABLE CHEST 1 VIEW COMPARISON:  Chest 04/18/2022 FINDINGS: Cardiac enlargement.  Postop TAVR.  Dual lead pacemaker unchanged. Elevated right hemidiaphragm has progressed. Right lower lobe atelectasis has progressed. Improvement in vascular congestion. No edema or significant effusion. IMPRESSION: 1. Progression of right lower lobe atelectasis. 2. Improvement in vascular congestion. Electronically Signed   By: Franchot Gallo M.D.   On: 05/09/2022 10:54    EKG: none   Assessment/Plan Principal Problem:   Acute on chronic respiratory failure with hypoxia (HCC) Active Problems:   Physical deconditioning   Obesity, Class III, BMI 40-49.9 (morbid obesity) (HCC)   Sleep apnea   Hypertension   Chronic diastolic CHF (congestive heart failure), NYHA class 2 (HCC)   S/P TAVR (transcatheter aortic valve replacement)   Decubital  ulcer  Assessment and Plan: * Acute on chronic respiratory failure with hypoxia ([place]) On 3 L chronically.  O2 sats initially checked was 80% but on room air.  On my evaluation sats > 95% on 3 L, status post on BiPAP, Solu-Medrol, and Nebs, he is calm without signs of respiratory distress at this time.  Chest x-ray showing-progression of RLL atelectasis.  Likely secondary to OSA/OHS considering habitus. -Continue CPAP nightly, may  need to use CPAP intermittently during the day -Albuterol nebs as needed   Physical deconditioning Physical deconditioning likely etiology for generalized weakness, falls.  She lives alone. -PT eval  Decubital ulcer Stage 2 Decubitus ulcer to right buttock/sacral area.  No evidence of surrounding cellulitis. -Wound care consult  Chronic diastolic CHF (congestive heart failure), NYHA class 2 (HCC) Stable and compensated.  Recent echo 10/2021, EF 55 to 60%, G1DD. -Resume home torsemide 40 mg daily  Hypertension Stable.  Sleep apnea Resume CPAP   DVT prophylaxis: Lovenox Code Status: Full Family Communication: None at bedside Disposition Plan: ~ 2 days Consults called: None Admission status: Obs tele     Author: Onnie Boer, MD 05/09/2022 7:49 PM  For on call review [email or web address].

## 2022-05-10 DIAGNOSIS — L89312 Pressure ulcer of right buttock, stage 2: Secondary | ICD-10-CM | POA: Diagnosis present

## 2022-05-10 DIAGNOSIS — F419 Anxiety disorder, unspecified: Secondary | ICD-10-CM | POA: Diagnosis present

## 2022-05-10 DIAGNOSIS — Z8679 Personal history of other diseases of the circulatory system: Secondary | ICD-10-CM | POA: Diagnosis not present

## 2022-05-10 DIAGNOSIS — E785 Hyperlipidemia, unspecified: Secondary | ICD-10-CM | POA: Diagnosis present

## 2022-05-10 DIAGNOSIS — G629 Polyneuropathy, unspecified: Secondary | ICD-10-CM | POA: Diagnosis not present

## 2022-05-10 DIAGNOSIS — I5032 Chronic diastolic (congestive) heart failure: Secondary | ICD-10-CM | POA: Diagnosis not present

## 2022-05-10 DIAGNOSIS — Z1152 Encounter for screening for COVID-19: Secondary | ICD-10-CM | POA: Diagnosis not present

## 2022-05-10 DIAGNOSIS — J449 Chronic obstructive pulmonary disease, unspecified: Secondary | ICD-10-CM | POA: Diagnosis present

## 2022-05-10 DIAGNOSIS — F32A Depression, unspecified: Secondary | ICD-10-CM | POA: Diagnosis present

## 2022-05-10 DIAGNOSIS — Z8673 Personal history of transient ischemic attack (TIA), and cerebral infarction without residual deficits: Secondary | ICD-10-CM | POA: Diagnosis not present

## 2022-05-10 DIAGNOSIS — J9611 Chronic respiratory failure with hypoxia: Secondary | ICD-10-CM | POA: Diagnosis not present

## 2022-05-10 DIAGNOSIS — I10 Essential (primary) hypertension: Secondary | ICD-10-CM | POA: Diagnosis not present

## 2022-05-10 DIAGNOSIS — R296 Repeated falls: Secondary | ICD-10-CM | POA: Diagnosis present

## 2022-05-10 DIAGNOSIS — Z8249 Family history of ischemic heart disease and other diseases of the circulatory system: Secondary | ICD-10-CM | POA: Diagnosis not present

## 2022-05-10 DIAGNOSIS — I509 Heart failure, unspecified: Secondary | ICD-10-CM

## 2022-05-10 DIAGNOSIS — Z9981 Dependence on supplemental oxygen: Secondary | ICD-10-CM | POA: Diagnosis not present

## 2022-05-10 DIAGNOSIS — Z79899 Other long term (current) drug therapy: Secondary | ICD-10-CM | POA: Diagnosis not present

## 2022-05-10 DIAGNOSIS — E662 Morbid (severe) obesity with alveolar hypoventilation: Secondary | ICD-10-CM | POA: Diagnosis present

## 2022-05-10 DIAGNOSIS — D649 Anemia, unspecified: Secondary | ICD-10-CM | POA: Diagnosis present

## 2022-05-10 DIAGNOSIS — G4733 Obstructive sleep apnea (adult) (pediatric): Secondary | ICD-10-CM | POA: Diagnosis not present

## 2022-05-10 DIAGNOSIS — J9601 Acute respiratory failure with hypoxia: Secondary | ICD-10-CM | POA: Diagnosis present

## 2022-05-10 DIAGNOSIS — I350 Nonrheumatic aortic (valve) stenosis: Secondary | ICD-10-CM | POA: Diagnosis present

## 2022-05-10 DIAGNOSIS — I5033 Acute on chronic diastolic (congestive) heart failure: Secondary | ICD-10-CM | POA: Diagnosis present

## 2022-05-10 DIAGNOSIS — Z6841 Body Mass Index (BMI) 40.0 and over, adult: Secondary | ICD-10-CM | POA: Diagnosis not present

## 2022-05-10 DIAGNOSIS — J9621 Acute and chronic respiratory failure with hypoxia: Secondary | ICD-10-CM | POA: Diagnosis present

## 2022-05-10 DIAGNOSIS — Z953 Presence of xenogenic heart valve: Secondary | ICD-10-CM | POA: Diagnosis not present

## 2022-05-10 DIAGNOSIS — I110 Hypertensive heart disease with heart failure: Secondary | ICD-10-CM | POA: Diagnosis present

## 2022-05-10 DIAGNOSIS — Z8261 Family history of arthritis: Secondary | ICD-10-CM | POA: Diagnosis not present

## 2022-05-10 DIAGNOSIS — L89313 Pressure ulcer of right buttock, stage 3: Secondary | ICD-10-CM | POA: Diagnosis present

## 2022-05-10 DIAGNOSIS — J9811 Atelectasis: Secondary | ICD-10-CM | POA: Diagnosis present

## 2022-05-10 DIAGNOSIS — I890 Lymphedema, not elsewhere classified: Secondary | ICD-10-CM | POA: Diagnosis not present

## 2022-05-10 MED ORDER — METHYLPREDNISOLONE SODIUM SUCC 40 MG IJ SOLR
40.0000 mg | Freq: Two times a day (BID) | INTRAMUSCULAR | Status: DC
  Administered 2022-05-10 – 2022-05-12 (×5): 40 mg via INTRAVENOUS
  Filled 2022-05-10 (×6): qty 1

## 2022-05-10 MED ORDER — DM-GUAIFENESIN ER 30-600 MG PO TB12
1.0000 | ORAL_TABLET | Freq: Two times a day (BID) | ORAL | Status: DC
  Administered 2022-05-10 – 2022-05-12 (×5): 1 via ORAL
  Filled 2022-05-10 (×5): qty 1

## 2022-05-10 MED ORDER — IPRATROPIUM-ALBUTEROL 0.5-2.5 (3) MG/3ML IN SOLN: 3.0000 mL | Freq: Four times a day (QID) | RESPIRATORY_TRACT | Status: DC | PRN

## 2022-05-10 NOTE — Plan of Care (Signed)
  Problem: Education: Goal: Knowledge of General Education information will improve Description Including pain rating scale, medication(s)/side effects and non-pharmacologic comfort measures Outcome: Progressing   Problem: Health Behavior/Discharge Planning: Goal: Ability to manage health-related needs will improve Outcome: Progressing   

## 2022-05-10 NOTE — NC FL2 (Signed)
[place] LEVEL OF CARE FORM     IDENTIFICATION  Patient Name: Dawn Gonzales Birthdate: 09-07-1940 Sex: female Admission Date (Current Location): 05/09/2022  [facility] and [place] Number:  [employer] and Address:  [place] [address], [place]      Provider Number: [id number]  Attending Physician Name and Address:  Roxan Hockey, MD  Relative Name and Phone Number:  cavanaugh,sara (Daughter) [phone]    Current Level of Care: Hospital Recommended Level of Care: [place] Prior Approval Number:    Date Approved/Denied:   PASRR Number: [id number]  Discharge Plan: SNF    Current Diagnoses: Patient Active Problem List   Diagnosis Date Noted   Acute exacerbation of CHF (congestive heart failure) ([place]) 05/10/2022   Acute on chronic respiratory failure with hypoxia ([place]) 05/09/2022   Decubital ulcer 05/09/2022   Small bowel obstruction ([place])    Hypoxia 08/20/2019   Enteritis 07/05/2019   Complete heart block ([place]) 07/05/2019   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   Cholelithiasis 07/05/2019   Gallstone ileus ([place])    Kidney lesion, native, right 07/11/2018   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   Sleep apnea    Hypertension    Hyperlipemia    Chronic diastolic CHF (congestive heart failure), NYHA class 2 (HCC)    Neuropathy    Physical deconditioning    History of TIA (transient ischemic attack)    Right bundle branch block (RBBB)    Obesity, Class III, BMI 40-49.9 (morbid obesity) (HCC)    Junctional bradycardia    Preoperative cardiovascular examination    Severe aortic stenosis    Choledocholithiasis    Epigastric pain 02/06/2018    Orientation RESPIRATION BLADDER Height & Weight     Self, Time, Place, Situation  Normal External catheter Weight: 122.8 kg Height:  5\' 6"  (167.6 cm)  BEHAVIORAL SYMPTOMS/MOOD NEUROLOGICAL BOWEL NUTRITION STATUS       Continent Diet (See DC summary)  AMBULATORY STATUS COMMUNICATION OF NEEDS Skin   Extensive Assist Verbally Bruising, PU Stage and Appropriate Care (Sacrum/ buttock)                       Personal Care Assistance Level of Assistance  Bathing, Feeding, Dressing Bathing Assistance: Maximum assistance Feeding assistance: Independent Dressing Assistance: Limited assistance     Functional Limitations Info  Sight, Hearing, Speech Sight Info: Impaired Hearing Info: Adequate Speech Info: Adequate    SPECIAL CARE FACTORS FREQUENCY  PT (By licensed PT)     PT Frequency: 5 times a week              Contractures      Additional Factors Info  Code Status, Allergies, Psychotropic Code Status Info: FULL Allergies Info: NKDA Psychotropic Info: Effexor         Current Medications (05/10/2022):  This is the current hospital active medication list Current Facility-Administered Medications  Medication Dose Route Frequency Provider Last Rate Last Admin   acetaminophen (TYLENOL) tablet 650 mg  650 mg Oral Q6H PRN Emokpae, Ejiroghene E, MD       Or   acetaminophen (TYLENOL) suppository 650 mg  650 mg Rectal Q6H PRN Emokpae, Ejiroghene E, MD       albuterol (PROVENTIL) (2.5 MG/3ML) 0.083% nebulizer solution 2.5 mg  2.5 mg Nebulization Q4H PRN Emokpae, Ejiroghene E, MD       dextromethorphan-guaiFENesin (MUCINEX DM) 30-600 MG per 12 hr tablet  1 tablet  1 tablet Oral BID Roxan Hockey, MD   1 tablet at 05/10/22 1231   enoxaparin (LOVENOX) injection 40 mg  40 mg Subcutaneous Q24H Emokpae, Ejiroghene E, MD   40 mg at 05/10/22 1027   gabapentin (NEURONTIN) capsule 300 mg  300 mg Oral TID Emokpae, Ejiroghene E, MD   300 mg at 05/10/22 1026   ipratropium-albuterol (DUONEB) 0.5-2.5 (3) MG/3ML nebulizer solution 3 mL  3 mL Nebulization BID Emokpae, Ejiroghene E, MD   3 mL at 05/10/22 0750   methylPREDNISolone sodium succinate (SOLU-MEDROL) 40 mg/mL injection 40 mg  40 mg Intravenous Q12H  Emokpae, Courage, MD   40 mg at 05/10/22 1231   polyethylene glycol (MIRALAX / GLYCOLAX) packet 17 g  17 g Oral Daily PRN Emokpae, Ejiroghene E, MD       torsemide (DEMADEX) tablet 40 mg  40 mg Oral Daily Emokpae, Ejiroghene E, MD   40 mg at 05/10/22 1027   venlafaxine (EFFEXOR) tablet 37.5 mg  37.5 mg Oral Daily Emokpae, Ejiroghene E, MD   37.5 mg at 05/10/22 1026     Discharge Medications: Please see discharge summary for a list of discharge medications.  Relevant Imaging Results:  Relevant Lab Results:   Additional Information SS# [id number]  Boneta Lucks, RN

## 2022-05-10 NOTE — Plan of Care (Signed)
  Problem: Acute Rehab PT Goals(only PT should resolve) Goal: Pt Will Go Supine/Side To Sit Outcome: Progressing Flowsheets (Taken 05/10/2022 1431) Pt will go Supine/Side to Sit:  with modified independence  with supervision Goal: Patient Will Transfer Sit To/From Stand Outcome: Progressing Flowsheets (Taken 05/10/2022 1431) Patient will transfer sit to/from stand:  with min guard assist  with supervision Goal: Pt Will Transfer Bed To Chair/Chair To Bed Outcome: Progressing Flowsheets (Taken 05/10/2022 1431) Pt will Transfer Bed to Chair/Chair to Bed:  with supervision  min guard assist Goal: Pt Will Ambulate Outcome: Progressing Flowsheets (Taken 05/10/2022 1431) Pt will Ambulate:  10 feet  with minimal assist  with moderate assist  with rolling walker   2:31 PM, 05/10/22 Lonell Grandchild, MPT Physical Therapist with [facility] [phone] office [phone] mobile phone

## 2022-05-10 NOTE — TOC Initial Note (Signed)
Transition of Care [facility]) - Initial/Assessment Note    Patient Details  Name: Dawn Gonzales MRN: [id number] Date of Birth: 04-03-1941  [facility]) CM/SW Contact:    Boneta Lucks, RN Phone Number: 05/10/2022, 10:17 AM  Clinical Narrative:   Patient admitted with acute on chronic respiratory failure with hypoxia. CM spoke with Daughter,Dawn Gonzales, patient discharged from [facility] on Sunday. Patient thinks she left to early and needs more rehab. She has had 3 falls since Sunday. Dawn Gonzales requested sending her out to other SN's [facility], if she get no other offers, She will go back to [facility].  PT eval pending. TOC will follow.                 Expected Discharge Plan: Skilled Nursing Facility Barriers to Discharge: Continued Medical Work up   Patient Goals and CMS Choice Patient states their goals for this hospitalization and ongoing recovery are:: to return to rehab. CMS Medicare.gov Compare Post Acute Care list provided to:: Patient Represenative (must comment) Choice offered to / list presented to : Adult Children     Expected Discharge Plan and Services      Living arrangements for the past 2 months: Single Family Home                    Prior Living Arrangements/Services Living arrangements for the past 2 months: Single Family Home Lives with:: Self        Activities of Daily Living Home Assistive Devices/Equipment: Environmental consultant (specify type), Wheelchair ADL Screening (condition at time of admission) Patient's cognitive ability adequate to safely complete daily activities?: Yes Is the patient deaf or have difficulty hearing?: No Does the patient have difficulty seeing, even when wearing glasses/contacts?: No Does the patient have difficulty concentrating, remembering, or making decisions?: No Patient able to express need for assistance with ADLs?: Yes Does the patient have difficulty dressing or bathing?: Yes Independently performs ADLs?:  No Communication: Needs assistance Is this a change from baseline?: Pre-admission baseline Dressing (OT): Needs assistance Is this a change from baseline?: Pre-admission baseline Grooming: Needs assistance Is this a change from baseline?: Pre-admission baseline Feeding: Independent Bathing: Needs assistance Is this a change from baseline?: Pre-admission baseline Toileting: Needs assistance Is this a change from baseline?: Pre-admission baseline In/Out Bed: Needs assistance Is this a change from baseline?: Pre-admission baseline Walks in Home: Needs assistance Does the patient have difficulty walking or climbing stairs?: Yes Weakness of Legs: Both Weakness of Arms/Hands: Both  Permission Sought/Granted      Share Information with NAME: Dawn Gonzales     Permission granted to share info w Relationship: Daughter     Emotional Assessment         Alcohol / Substance Use: Not Applicable Psych Involvement: No (comment)  Admission diagnosis:  Acute respiratory failure with hypoxia ([place]) [J96.01] Acute on chronic respiratory failure with hypoxia ([place]) [J96.21] Acute on chronic hypoxic respiratory failure ([place]) [J96.21] Patient Active Problem List   Diagnosis Date Noted   Acute on chronic respiratory failure with hypoxia ([place]) 05/09/2022   Decubital ulcer 05/09/2022   Small bowel obstruction ([place])    Hypoxia 08/20/2019   Enteritis 07/05/2019   Complete heart block ([place]) 07/05/2019   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   Cholelithiasis 07/05/2019   Gallstone ileus ([place])    Kidney lesion, native, right 07/11/2018   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   Sleep apnea    Hypertension    Hyperlipemia  Chronic diastolic CHF (congestive heart failure), NYHA class 2 (HCC)    Neuropathy    Physical deconditioning    History of TIA (transient ischemic attack)    Right bundle branch block (RBBB)    Obesity, Class III, BMI 40-49.9 (morbid obesity) ([place])     Junctional bradycardia    Preoperative cardiovascular examination    Severe aortic stenosis    Choledocholithiasis    Epigastric pain 02/06/2018   PCP:  [name], MD Pharmacy:   [id number] - [place], [place] - [address] [place] [address] Phone: [phone] Fax: [phone]     Social Determinants of Health (SDOH) Social History: SDOH Screenings   Food Insecurity: No Food Insecurity (05/09/2022)  Housing: Low Risk  (05/09/2022)  Transportation Needs: No Transportation Needs (05/09/2022)  Utilities: Not At Risk (05/09/2022)  Tobacco Use: Medium Risk (05/09/2022)

## 2022-05-10 NOTE — Evaluation (Signed)
Physical Therapy Evaluation Patient Details Name: Dawn Gonzales MRN: [id number] DOB: October 12, 1940 Today's Date: 05/10/2022  History of Present Illness  Dawn Gonzales is a 82 y.o. female with medical history significant for TAVR, complete heart block, diastolic CHF, hypertension, on chronic 3 L of O2.  Patient presented to the ED with complaints of weakness and falling.  Per chart notes he also reported difficulty breathing, but patient tells me her breathing is fine.  She reports she has fallen 3 times since she was discharged from nursing home 2 days ago.  She reports she felt weak after rehab stay, did not feel ready to go back home.  She lives alone..  She tells me she developed a pressure ulcer on her buttock during her rehab stay also also, ulcer is without drainage.  She denies cough, no chest pain.  No lower extremity swelling.  She has been compliant with her torsemide.  She is on CPAP nightly and is pleased with nightly since she was discharged from nursing home.   Clinical Impression  Patient demonstrates slow labored movement for sitting up at bedside with frequent rest breaks, required repeated attempts before able to complete sit to stand from bedside and limited to a few slow labored unsteady side steps before having to sit due to fatigue/generalized weakness.  Patient tolerated sitting up in chair after therapy.  Patient will benefit from continued skilled physical therapy in hospital and recommended venue below to increase strength, balance, endurance for safe ADLs and gait.          Recommendations for follow up therapy are one component of a multi-disciplinary discharge planning process, led by the attending physician.  Recommendations may be updated based on patient status, additional functional criteria and insurance authorization.  Follow Up Recommendations Skilled nursing-short term rehab (<3 hours/day) Can patient physically be transported by private vehicle:  No    Assistance Recommended at Discharge Intermittent Supervision/Assistance  Patient can return home with the following  A little help with bathing/dressing/bathroom;A lot of help with walking and/or transfers;Help with stairs or ramp for entrance;Assistance with cooking/housework    Equipment Recommendations None recommended by PT  Recommendations for Other Services       Functional Status Assessment Patient has had a recent decline in their functional status and demonstrates the ability to make significant improvements in function in a reasonable and predictable amount of time.     Precautions / Restrictions Precautions Precautions: Fall Restrictions Weight Bearing Restrictions: No      Mobility  Bed Mobility Overal bed mobility: Needs Assistance Bed Mobility: Supine to Sit     Supine to sit: Min guard, HOB elevated     General bed mobility comments: increased time, labored movement, frequent rest breaks    Transfers Overall transfer level: Needs assistance Equipment used: Rolling walker (2 wheels) Transfers: Sit to/from Stand, Bed to chair/wheelchair/BSC Sit to Stand: Min guard   Step pivot transfers: Min assist       General transfer comment: requred repeated attempts before able to complete sit to stand from bedside due to weakness    Ambulation/Gait Ambulation/Gait assistance: Min assist, Mod assist Gait Distance (Feet): 3 Feet Assistive device: Rolling walker (2 wheels) Gait Pattern/deviations: Decreased step length - right, Decreased step length - left, Decreased stride length Gait velocity: slow     General Gait Details: limited to a few slow labored unsteady side steps before having to sit due to fatigue and generalized weakness  Stairs  Wheelchair Mobility    Modified Rankin (Stroke Patients Only)       Balance Overall balance assessment: Needs assistance Sitting-balance support: Feet supported, No upper extremity  supported Sitting balance-Leahy Scale: Fair Sitting balance - Comments: fair/good seated at EOB   Standing balance support: Reliant on assistive device for balance, During functional activity, Bilateral upper extremity supported Standing balance-Leahy Scale: Poor Standing balance comment: fair/poor using RW                             Pertinent Vitals/Pain Pain Assessment Pain Assessment: No/denies pain    Home Living Family/patient expects to be discharged to:: Private residence Living Arrangements: Alone Available Help at Discharge: Family;Available PRN/intermittently;Other (Comment);Personal care attendant Type of Home: House Home Access: Ramped entrance       Home Layout: One level Home Equipment: Conservation officer, nature (2 wheels);Wheelchair - manual;Grab bars - toilet;Grab bars - tub/shower;Shower seat - built in;BSC/3in1;Other (comment)      Prior Function Prior Level of Function : Needs assist       Physical Assist : Mobility (physical);ADLs (physical) Mobility (physical): Bed mobility;Transfers;Gait;Stairs   Mobility Comments: uses wheelchair for mobility, completes transfers from bed to w/c, w/c to commode using RW ADLs Comments: Assisted by family, has home aides 2 days/week x 4 hours/day     Hand Dominance   Dominant Hand: Right    Extremity/Trunk Assessment   Upper Extremity Assessment Upper Extremity Assessment: Generalized weakness    Lower Extremity Assessment Lower Extremity Assessment: Generalized weakness    Cervical / Trunk Assessment Cervical / Trunk Assessment: Normal  Communication   Communication: No difficulties  Cognition Arousal/Alertness: Awake/alert Behavior During Therapy: WFL for tasks assessed/performed Overall Cognitive Status: Within Functional Limits for tasks assessed                                          General Comments      Exercises     Assessment/Plan    PT Assessment Patient needs  continued PT services  PT Problem List Decreased strength;Decreased activity tolerance;Decreased balance;Decreased mobility       PT Treatment Interventions DME instruction;Gait training;Functional mobility training;Therapeutic activities;Therapeutic exercise;Balance training;Wheelchair mobility training;Patient/family education    PT Goals (Current goals can be found in the Care Plan section)  Acute Rehab PT Goals Patient Stated Goal: return home after rehab PT Goal Formulation: With patient Time For Goal Achievement: 05/24/22 Potential to Achieve Goals: Good    Frequency Min 3X/week     Co-evaluation               AM-PAC PT "6 Clicks" Mobility  Outcome Measure Help needed turning from your back to your side while in a flat bed without using bedrails?: A Little Help needed moving from lying on your back to sitting on the side of a flat bed without using bedrails?: A Little Help needed moving to and from a bed to a chair (including a wheelchair)?: A Little Help needed standing up from a chair using your arms (e.g., wheelchair or bedside chair)?: A Lot Help needed to walk in hospital room?: A Lot Help needed climbing 3-5 steps with a railing? : Total 6 Click Score: 14    End of Session Equipment Utilized During Treatment: Oxygen Activity Tolerance: Patient tolerated treatment well;Patient limited by fatigue Patient left: in chair;with call bell/phone  within reach Nurse Communication: Mobility status PT Visit Diagnosis: Unsteadiness on feet (R26.81);Other abnormalities of gait and mobility (R26.89);Muscle weakness (generalized) (M62.81)    Time: 1110-1130 PT Time Calculation (min) (ACUTE ONLY): 20 min   Charges:   PT Evaluation $PT Eval Moderate Complexity: 1 Mod PT Treatments $Therapeutic Activity: 8-22 mins        2:30 PM, 05/10/22 Lonell Grandchild, MPT Physical Therapist with [facility] [phone] office [phone] mobile phone

## 2022-05-10 NOTE — Consult Note (Signed)
WOC Nurse Consult Note: Reason for Consult: "decubitus ulcers" Patient from home, recent admission to rehab in [place] and DC to home. CHF and SOB Wound type: Stage 3 Pressure Injury; right buttock Scattered partial thickness openings over the sacral region Appears patient has been wearing incontinence brief Pressure Injury POA: Yes Measurement: see nursing flow sheets Wound bed:100% clean, pink, pale, moist Drainage (amount, consistency, odor) serosanguinous in images  Periwound: see description above Dressing procedure/placement/frequency: Add low air loss mattress for moisture management and pressure redistribution Silver hydrofiber for recalcitrant wound with drainage daily Silicone foam to protect open areas over the sacral region    Re consult if needed, will not follow at this time. Thanks  Maigen Mozingo [employer], RN,CWOCN, CNS, [place] [phone])

## 2022-05-10 NOTE — Progress Notes (Signed)
PROGRESS NOTE     Dawn Gonzales, is a 82 y.o. female, DOB - 07/27/1940, TDV:[id number]  Admit date - 05/09/2022   Admitting Physician [name] Brick, MD  Outpatient Primary MD for the patient is [name], MD  LOS - 0  Chief Complaint  Patient presents with   Shortness of Breath        Brief Narrative:   82 y.o. female with medical history significant for TAVR, complete heart block, diastolic CHF, hypertension, on chronic 3 L of O2 re-admitted on 05/09/22 with recurrent falls and acute on chronic hypoxic respiratory failure secondary to acute on chronic diastolic CHF exacerbation    -Assessment and Plan:  1)Acute on chronic respiratory failure with hypoxia -POA On 3 L chronically.  -Respiratory status improving after BiPAP use, steroids and nebulizer treatments -Continue BiPAP nightly and as needed -We are continuing to wean down oxygen patient is currently down to 4 L per nasal cannula -Chest x-ray without pneumonia, - WBC WNL,  -No fevers or evidence of frank respiratory infection  2)Morbid Obesity/OSA -Suspect some degree of OSA and possible OHS -Suspect some degree of chronic hypercapnia --Continue BiPAP nightly and as needed -Low calorie diet, portion control and increase physical activity discussed with patient -Body mass index is 43.7 kg/m.  3)Chronic diastolic CHF (congestive heart failure), NYHA class 2 ([place]) -Chest x-ray and BNP does not suggest significant CHF exacerbation -Echo 10/2021, EF 55 to 60%, G1DD. -Continue PTA Torsemide 40 mg daily  4)Generalized weakness and deconditioning with Recurrent Falls---PTA pt lived alone and did very poorly, patient has significant limitations with mobility related ADLs- this patient needs to continue to be monitored in the hospital until a SNF bed is obtained as she is not safe to go home with her current physcical limitations - Patient was admitted in the hospital from 04/05/22 thru 04/07/22, went to SNF rehab  from 04/07/22 thru 05/07/22 -Has had recurrent Falls since dc from SNF on 05/07/22  5)Decubital ulcer/PI---POA Stage 2 Decubitus ulcer to right buttock/sacral area.  No evidence of surrounding cellulitis. -Wound care consult appreciated, recommends Silver hydrofiber for recalcitrant wound with drainage daily and Silicone foam to protect open areas over the sacral region   6)Depression----stable, continue Effexor  Status is: Inpatient   Disposition: The patient is from: Home              Anticipated d/c is to: SNF              Anticipated d/c date is: 2 days              Patient currently is not medically stable to d/c. Barriers: Not Clinically Stable-   Code Status :  -  Code Status: Full Code   Family Communication:    NA (patient is alert, awake and coherent)   DVT Prophylaxis  :   - SCDs   enoxaparin (LOVENOX) injection 40 mg Start: 05/10/22 1000   Lab Results  Component Value Date   PLT 228 05/09/2022    Inpatient Medications  Scheduled Meds:  dextromethorphan-guaiFENesin  1 tablet Oral BID   enoxaparin (LOVENOX) injection  40 mg Subcutaneous Q24H   gabapentin  300 mg Oral TID   ipratropium-albuterol  3 mL Nebulization BID   methylPREDNISolone (SOLU-MEDROL) injection  40 mg Intravenous Q12H   torsemide  40 mg Oral Daily   venlafaxine  37.5 mg Oral Daily   Continuous Infusions: PRN Meds:.acetaminophen **OR** acetaminophen, albuterol, polyethylene glycol   Anti-infectives (From admission, onward)  None         Subjective: [name] today has no fevers, no emesis,  No chest pain,   -Respiratory status improved and after prolonged BiPAP use -Cough and dyspnea improving -Voiding well   Objective: Vitals:   05/10/22 0038 05/10/22 0051 05/10/22 0100 05/10/22 0516  BP:   105/62 (!) 131/54  Pulse:  71 77 78  Resp:  18 (!) 22 (!) 24  Temp:   (!) 97.3 F (36.3 C) 97.7 F (36.5 C)  TempSrc:   Axillary Axillary  SpO2: 91% 96% 95% (!) 87%  Weight:       Height:        Intake/Output Summary (Last 24 hours) at 05/10/2022 1214 Last data filed at 05/10/2022 0900 Gross per 24 hour  Intake 240 ml  Output 550 ml  Net -310 ml   Filed Weights   05/09/22 2221  Weight: 122.8 kg    Physical Exam  Gen:- Awake Alert, morbidly obese, in no apparent distress  HEENT:- [place].AT, No sclera icterus Nose-[place] at baseline patient uses 3 L of oxygen via nasal cannula Neck-Supple Neck,No JVD,.  Lungs-no wheezing, no rhonchi or rails, fair symmetrical air movement CV- S1, S2 normal, regular  Abd-  +ve B.Sounds, Abd Soft, No tenderness,    Extremity/Skin:-+ve  edema, pedal pulses present  Psych-affect is appropriate, oriented x3 Neuro-generalized weakness, no new focal deficits, no tremors  Data Reviewed: I have personally reviewed following labs and imaging studies  CBC: Recent Labs  Lab 05/09/22 1132  WBC 6.7  NEUTROABS 4.5  HGB 12.3  HCT 38.1  MCV 91.1  PLT 228   Basic Metabolic Panel: Recent Labs  Lab 05/09/22 1132  NA 140  K 3.6  CL 93*  CO2 34*  GLUCOSE 118*  BUN 26*  CREATININE 1.00  CALCIUM 9.6   GFR: Estimated Creatinine Clearance: 59 mL/min (by C-G formula based on SCr of 1 mg/dL). Liver Function Tests: Recent Labs  Lab 05/09/22 1132  AST 26  ALT 19  ALKPHOS 75  BILITOT 0.5  PROT 6.9  ALBUMIN 3.1*   Recent Results (from the past 240 hour(s))  Resp panel by RT-PCR (RSV, Flu A&B, Covid) Anterior Nasal Swab     Status: None   Collection Time: 05/09/22 11:03 AM   Specimen: Anterior Nasal Swab  Result Value Ref Range Status   SARS Coronavirus 2 by RT PCR NEGATIVE NEGATIVE Final    Comment: (NOTE) SARS-CoV-2 target nucleic acids are NOT DETECTED.  The SARS-CoV-2 RNA is generally detectable in upper respiratory specimens during the acute phase of infection. The lowest concentration of SARS-CoV-2 viral copies this assay can detect is 138 copies/mL. A negative result does not preclude SARS-Cov-2 infection and  should not be used as the sole basis for treatment or other patient management decisions. A negative result may occur with  improper specimen collection/handling, submission of specimen other than nasopharyngeal swab, presence of viral mutation(s) within the areas targeted by this assay, and inadequate number of viral copies(<138 copies/mL). A negative result must be combined with clinical observations, patient history, and epidemiological information. The expected result is Negative.  Fact Sheet for Patients:  BloggerCourse.com  Fact Sheet for Healthcare Providers:  SeriousBroker.it  This test is no t yet approved or cleared by the [place] FDA and  has been authorized for detection and/or diagnosis of SARS-CoV-2 by FDA under an Emergency Use Authorization (EUA). This EUA will remain  in effect (meaning this test can be used) for  the duration of the COVID-19 declaration under Section 564(b)(1) of the Act, 21 U.S.C.section 360bbb-3(b)(1), unless the authorization is terminated  or revoked sooner.       Influenza A by PCR NEGATIVE NEGATIVE Final   Influenza B by PCR NEGATIVE NEGATIVE Final    Comment: (NOTE) The Xpert Xpress SARS-CoV-2/FLU/RSV plus assay is intended as an aid in the diagnosis of influenza from Nasopharyngeal swab specimens and should not be used as a sole basis for treatment. Nasal washings and aspirates are unacceptable for Xpert Xpress SARS-CoV-2/FLU/RSV testing.  Fact Sheet for Patients: EntrepreneurPulse.com.au  Fact Sheet for Healthcare Providers: IncredibleEmployment.be  This test is not yet approved or cleared by the [place] FDA and has been authorized for detection and/or diagnosis of SARS-CoV-2 by FDA under an Emergency Use Authorization (EUA). This EUA will remain in effect (meaning this test can be used) for the duration of the COVID-19 declaration  under Section 564(b)(1) of the Act, 21 U.S.C. section 360bbb-3(b)(1), unless the authorization is terminated or revoked.     Resp Syncytial Virus by PCR NEGATIVE NEGATIVE Final    Comment: (NOTE) Fact Sheet for Patients: EntrepreneurPulse.com.au  Fact Sheet for Healthcare Providers: IncredibleEmployment.be  This test is not yet approved or cleared by the [place] FDA and has been authorized for detection and/or diagnosis of SARS-CoV-2 by FDA under an Emergency Use Authorization (EUA). This EUA will remain in effect (meaning this test can be used) for the duration of the COVID-19 declaration under Section 564(b)(1) of the Act, 21 U.S.C. section 360bbb-3(b)(1), unless the authorization is terminated or revoked.  Performed at [facility], [address] [place], [place] [address]     adiology Studies: DG Chest Portable 1 View  Result Date: 05/09/2022 CLINICAL DATA:  Dyspnea EXAM: PORTABLE CHEST 1 VIEW COMPARISON:  Chest 04/18/2022 FINDINGS: Cardiac enlargement.  Postop TAVR.  Dual lead pacemaker unchanged. Elevated right hemidiaphragm has progressed. Right lower lobe atelectasis has progressed. Improvement in vascular congestion. No edema or significant effusion. IMPRESSION: 1. Progression of right lower lobe atelectasis. 2. Improvement in vascular congestion. Electronically Signed   By: Franchot Gallo M.D.   On: 05/09/2022 10:54     Scheduled Meds:  dextromethorphan-guaiFENesin  1 tablet Oral BID   enoxaparin (LOVENOX) injection  40 mg Subcutaneous Q24H   gabapentin  300 mg Oral TID   ipratropium-albuterol  3 mL Nebulization BID   methylPREDNISolone (SOLU-MEDROL) injection  40 mg Intravenous Q12H   torsemide  40 mg Oral Daily   venlafaxine  37.5 mg Oral Daily   Continuous Infusions:   LOS: 0 days    Roxan Hockey M.D on 05/10/2022 at 12:14 PM  Go to [email or web address] - for contact info  [facility] - Office  [phone]  If  7PM-7AM, please contact night-coverage [email or web address] 05/10/2022, 12:14 PM

## 2022-05-11 DIAGNOSIS — J9621 Acute and chronic respiratory failure with hypoxia: Secondary | ICD-10-CM | POA: Diagnosis not present

## 2022-05-11 NOTE — Progress Notes (Signed)
PROGRESS NOTE   Dawn Gonzales, is a 82 y.o. female, DOB - Aug 05, 1940, RKY:[id number]  Admit date - 05/09/2022   Admitting Physician Graziella Connery Denton Brick, MD  Outpatient Primary MD for the patient is [name], MD  LOS - 1  Chief Complaint  Patient presents with   Shortness of Breath       Brief Narrative:   82 y.o. female with medical history significant for TAVR, complete heart block, diastolic CHF, hypertension, on chronic 3 L of O2 re-admitted on 05/09/22 with recurrent falls and acute on chronic hypoxic respiratory failure secondary to acute on chronic diastolic CHF exacerbation    -Assessment and Plan:  1)Acute on chronic respiratory failure with hypoxia -POA On 3 L chronically.  -Respiratory status improving after BiPAP use, steroids and nebulizer treatments -Continue BiPAP nightly and as needed -Dyspnea and hypoxia persist- but improving -Chest x-ray without pneumonia, - WBC WNL,  -No fevers or evidence of frank respiratory infection  2)Morbid Obesity/OSA -Suspect some degree of OSA and possible OHS -Suspect some degree of chronic hypercapnia --Continue BiPAP nightly and as needed -Low calorie diet, portion control and increase physical activity discussed with patient -Body mass index is 43.7 kg/m.  3)Chronic diastolic CHF (congestive heart failure), NYHA class 2 ([place]) -Chest x-ray and BNP does not suggest significant CHF exacerbation -Echo 10/2021, EF 55 to 60%, G1DD. -Continue PTA Torsemide 40 mg daily  4)Generalized weakness and deconditioning with Recurrent Falls---PTA pt lived alone and did very poorly, patient has significant limitations with mobility related ADLs- this patient needs to continue to be monitored in the hospital until a SNF bed is obtained as she is not safe to go home with her current physcical limitations -Patient was admitted in the hospital from 04/05/22 thru 04/07/22, went to SNF rehab from 04/07/22 thru 05/07/22 -Has had recurrent Falls  since dc from SNF on 05/07/22 -Physical therapy eval appreciated recommends SNF rehab  5)Decubital ulcer/PI---POA Stage 2 Decubitus ulcer to right buttock/sacral area.  No evidence of surrounding cellulitis. -Wound care consult appreciated, recommends Silver hydrofiber for recalcitrant wound with drainage daily and Silicone foam to protect open areas over the sacral region   6)Depression----stable, continue Effexor  Status is: Inpatient   Disposition: The patient is from: Home              Anticipated d/c is to: SNF              Anticipated d/c date is: 1 day              Patient currently is not medically stable to d/c. Barriers: Not Clinically Stable-  --Anticipate discharge to SNF rehab possibly on 05/12/2022 if insurance authorization can be obtained  Code Status :  -  Code Status: Full Code   Family Communication:    NA (patient is alert, awake and coherent)   DVT Prophylaxis  :   - SCDs   enoxaparin (LOVENOX) injection 40 mg Start: 05/10/22 1000   Lab Results  Component Value Date   PLT 228 05/09/2022   Inpatient Medications  Scheduled Meds:  dextromethorphan-guaiFENesin  1 tablet Oral BID   enoxaparin (LOVENOX) injection  40 mg Subcutaneous Q24H   gabapentin  300 mg Oral TID   methylPREDNISolone (SOLU-MEDROL) injection  40 mg Intravenous Q12H   torsemide  40 mg Oral Daily   venlafaxine  37.5 mg Oral Daily   Continuous Infusions: PRN Meds:.acetaminophen **OR** acetaminophen, albuterol, ipratropium-albuterol, polyethylene glycol   Anti-infectives (From admission, onward)  None       Subjective: Dawn Gonzales today has no fevers, no emesis,  No chest pain,   -Some cough and dyspnea persist - -Hard significant dyspnea on exertion when trying to move around, --Anticipate discharge to SNF rehab possibly on 05/12/2022 if insurance authorization can be obtained  Objective: Vitals:   05/10/22 1957 05/10/22 2058 05/10/22 2346 05/11/22 0428  BP:  (!) 107/58   131/68  Pulse:  75 71 60  Resp:  18 15 20   Temp:  98.4 F (36.9 C)  98.6 F (37 C)  TempSrc:  Oral    SpO2: 93% 93% 94% 95%  Weight:      Height:        Intake/Output Summary (Last 24 hours) at 05/11/2022 1314 Last data filed at 05/11/2022 0500 Gross per 24 hour  Intake --  Output 200 ml  Net -200 ml   Filed Weights   05/09/22 2221  Weight: 122.8 kg   Physical Exam  Gen:- Awake Alert, morbidly obese, in no apparent distress  HEENT:- [place].AT, No sclera icterus Nose-Dent 4L/min Neck-Supple Neck,No JVD,.  Lungs-no wheezing, no rhonchi or rales, fair symmetrical air movement CV- S1, S2 normal, regular  Abd-  +ve B.Sounds, Abd Soft, No tenderness,    Extremity/Skin:-Improving edema, pedal pulses present  Psych-affect is appropriate, oriented x3 Neuro-generalized weakness, no new focal deficits, no tremors  Data Reviewed: I have personally reviewed following labs and imaging studies  CBC: Recent Labs  Lab 05/09/22 1132  WBC 6.7  NEUTROABS 4.5  HGB 12.3  HCT 38.1  MCV 91.1  PLT 630   Basic Metabolic Panel: Recent Labs  Lab 05/09/22 1132  NA 140  K 3.6  CL 93*  CO2 34*  GLUCOSE 118*  BUN 26*  CREATININE 1.00  CALCIUM 9.6   GFR: Estimated Creatinine Clearance: 59 mL/min (by C-G formula based on SCr of 1 mg/dL). Liver Function Tests: Recent Labs  Lab 05/09/22 1132  AST 26  ALT 19  ALKPHOS 75  BILITOT 0.5  PROT 6.9  ALBUMIN 3.1*   Recent Results (from the past 240 hour(s))  Resp panel by RT-PCR (RSV, Flu A&B, Covid) Anterior Nasal Swab     Status: None   Collection Time: 05/09/22 11:03 AM   Specimen: Anterior Nasal Swab  Result Value Ref Range Status   SARS Coronavirus 2 by RT PCR NEGATIVE NEGATIVE Final    Comment: (NOTE) SARS-CoV-2 target nucleic acids are NOT DETECTED.  The SARS-CoV-2 RNA is generally detectable in upper respiratory specimens during the acute phase of infection. The lowest concentration of SARS-CoV-2 viral copies this assay can  detect is 138 copies/mL. A negative result does not preclude SARS-Cov-2 infection and should not be used as the sole basis for treatment or other patient management decisions. A negative result may occur with  improper specimen collection/handling, submission of specimen other than nasopharyngeal swab, presence of viral mutation(s) within the areas targeted by this assay, and inadequate number of viral copies(<138 copies/mL). A negative result must be combined with clinical observations, patient history, and epidemiological information. The expected result is Negative.  Fact Sheet for Patients:  EntrepreneurPulse.com.au  Fact Sheet for Healthcare Providers:  IncredibleEmployment.be  This test is no t yet approved or cleared by the [place] FDA and  has been authorized for detection and/or diagnosis of SARS-CoV-2 by FDA under an Emergency Use Authorization (EUA). This EUA will remain  in effect (meaning this test can be used) for the duration of the  COVID-19 declaration under Section 564(b)(1) of the Act, 21 U.S.C.section 360bbb-3(b)(1), unless the authorization is terminated  or revoked sooner.       Influenza A by PCR NEGATIVE NEGATIVE Final   Influenza B by PCR NEGATIVE NEGATIVE Final    Comment: (NOTE) The Xpert Xpress SARS-CoV-2/FLU/RSV plus assay is intended as an aid in the diagnosis of influenza from Nasopharyngeal swab specimens and should not be used as a sole basis for treatment. Nasal washings and aspirates are unacceptable for Xpert Xpress SARS-CoV-2/FLU/RSV testing.  Fact Sheet for Patients: BloggerCourse.com  Fact Sheet for Healthcare Providers: SeriousBroker.it  This test is not yet approved or cleared by the [place] FDA and has been authorized for detection and/or diagnosis of SARS-CoV-2 by FDA under an Emergency Use Authorization (EUA). This EUA will remain in  effect (meaning this test can be used) for the duration of the COVID-19 declaration under Section 564(b)(1) of the Act, 21 U.S.C. section 360bbb-3(b)(1), unless the authorization is terminated or revoked.     Resp Syncytial Virus by PCR NEGATIVE NEGATIVE Final    Comment: (NOTE) Fact Sheet for Patients: BloggerCourse.com  Fact Sheet for Healthcare Providers: SeriousBroker.it  This test is not yet approved or cleared by the [place] FDA and has been authorized for detection and/or diagnosis of SARS-CoV-2 by FDA under an Emergency Use Authorization (EUA). This EUA will remain in effect (meaning this test can be used) for the duration of the COVID-19 declaration under Section 564(b)(1) of the Act, 21 U.S.C. section 360bbb-3(b)(1), unless the authorization is terminated or revoked.  Performed at [facility], [address] [place], [place] [address]     adiology Studies: No results found.   Scheduled Meds:  dextromethorphan-guaiFENesin  1 tablet Oral BID   enoxaparin (LOVENOX) injection  40 mg Subcutaneous Q24H   gabapentin  300 mg Oral TID   methylPREDNISolone (SOLU-MEDROL) injection  40 mg Intravenous Q12H   torsemide  40 mg Oral Daily   venlafaxine  37.5 mg Oral Daily   Continuous Infusions:   LOS: 1 day    Shon Hale M.D on 05/11/2022 at 1:14 PM  Go to [email or web address] - for contact info  [facility] - Office  [phone]  If 7PM-7AM, please contact night-coverage [email or web address] 05/11/2022, 1:14 PM

## 2022-05-11 NOTE — TOC Progression Note (Addendum)
Transition of Care [facility]) - Progression Note    Patient Details  Name: Dawn Gonzales MRN: [id number] Date of Birth: 1940-08-12  Transition [facility] CM/SW Contact  Boneta Lucks, RN Phone Number: 05/11/2022, 2:42 PM  Clinical Narrative:   Discussed bed offers with daughter, Clarise Cruz. She wants [facility]. CM spoke with Tammy at  [facility] , they will order her a CPAP machine to use in facility. They need MD to add CPAP and sleep study needed to DC summary, MD aware to order. Insurance Auth is pending, CMA added [facility] to [place]. MD updated. DC planning for tomorrow.   Addendum Josem Kaufmann approved 1/10 - 1/12, next review 1/12, Candace Cruise id [id number]     Expected Discharge Plan: [place] Barriers to Discharge: Insurance Authorization, Continued Medical Work up  Expected Discharge Plan and Services      Living arrangements for the past 2 months: Single Family Home                   Social Determinants of Health (SDOH) Interventions SDOH Screenings   Food Insecurity: No Food Insecurity (05/09/2022)  Housing: Low Risk  (05/09/2022)  Transportation Needs: No Transportation Needs (05/09/2022)  Utilities: Not At Risk (05/09/2022)  Tobacco Use: Medium Risk (05/09/2022)    Readmission Risk Interventions    05/11/2022    2:42 PM  Readmission Risk Prevention Plan  Post Dischage Appt Not Complete  Medication Screening Complete  Transportation Screening Complete

## 2022-05-12 DIAGNOSIS — N1831 Chronic kidney disease, stage 3a: Secondary | ICD-10-CM | POA: Diagnosis not present

## 2022-05-12 DIAGNOSIS — M170 Bilateral primary osteoarthritis of knee: Secondary | ICD-10-CM | POA: Diagnosis not present

## 2022-05-12 DIAGNOSIS — M79662 Pain in left lower leg: Secondary | ICD-10-CM | POA: Diagnosis not present

## 2022-05-12 DIAGNOSIS — K5900 Constipation, unspecified: Secondary | ICD-10-CM | POA: Diagnosis not present

## 2022-05-12 DIAGNOSIS — L89133 Pressure ulcer of right lower back, stage 3: Secondary | ICD-10-CM | POA: Diagnosis not present

## 2022-05-12 DIAGNOSIS — I87333 Chronic venous hypertension (idiopathic) with ulcer and inflammation of bilateral lower extremity: Secondary | ICD-10-CM | POA: Diagnosis not present

## 2022-05-12 DIAGNOSIS — Z8679 Personal history of other diseases of the circulatory system: Secondary | ICD-10-CM | POA: Diagnosis not present

## 2022-05-12 DIAGNOSIS — M25461 Effusion, right knee: Secondary | ICD-10-CM | POA: Diagnosis not present

## 2022-05-12 DIAGNOSIS — R279 Unspecified lack of coordination: Secondary | ICD-10-CM | POA: Diagnosis not present

## 2022-05-12 DIAGNOSIS — M6281 Muscle weakness (generalized): Secondary | ICD-10-CM | POA: Diagnosis not present

## 2022-05-12 DIAGNOSIS — J9611 Chronic respiratory failure with hypoxia: Secondary | ICD-10-CM | POA: Diagnosis not present

## 2022-05-12 DIAGNOSIS — E559 Vitamin D deficiency, unspecified: Secondary | ICD-10-CM | POA: Diagnosis not present

## 2022-05-12 DIAGNOSIS — L03119 Cellulitis of unspecified part of limb: Secondary | ICD-10-CM | POA: Diagnosis not present

## 2022-05-12 DIAGNOSIS — J9621 Acute and chronic respiratory failure with hypoxia: Secondary | ICD-10-CM | POA: Diagnosis not present

## 2022-05-12 DIAGNOSIS — I639 Cerebral infarction, unspecified: Secondary | ICD-10-CM | POA: Diagnosis not present

## 2022-05-12 DIAGNOSIS — D509 Iron deficiency anemia, unspecified: Secondary | ICD-10-CM | POA: Diagnosis not present

## 2022-05-12 DIAGNOSIS — L89313 Pressure ulcer of right buttock, stage 3: Secondary | ICD-10-CM | POA: Diagnosis not present

## 2022-05-12 DIAGNOSIS — I5032 Chronic diastolic (congestive) heart failure: Secondary | ICD-10-CM | POA: Diagnosis not present

## 2022-05-12 DIAGNOSIS — G8929 Other chronic pain: Secondary | ICD-10-CM | POA: Diagnosis not present

## 2022-05-12 DIAGNOSIS — I509 Heart failure, unspecified: Secondary | ICD-10-CM | POA: Diagnosis not present

## 2022-05-12 DIAGNOSIS — Z79899 Other long term (current) drug therapy: Secondary | ICD-10-CM | POA: Diagnosis not present

## 2022-05-12 DIAGNOSIS — G4733 Obstructive sleep apnea (adult) (pediatric): Secondary | ICD-10-CM | POA: Diagnosis not present

## 2022-05-12 DIAGNOSIS — M79661 Pain in right lower leg: Secondary | ICD-10-CM | POA: Diagnosis not present

## 2022-05-12 DIAGNOSIS — G44009 Cluster headache syndrome, unspecified, not intractable: Secondary | ICD-10-CM | POA: Diagnosis not present

## 2022-05-12 DIAGNOSIS — I959 Hypotension, unspecified: Secondary | ICD-10-CM | POA: Diagnosis not present

## 2022-05-12 DIAGNOSIS — Z7401 Bed confinement status: Secondary | ICD-10-CM | POA: Diagnosis not present

## 2022-05-12 DIAGNOSIS — G629 Polyneuropathy, unspecified: Secondary | ICD-10-CM | POA: Diagnosis not present

## 2022-05-12 DIAGNOSIS — M25562 Pain in left knee: Secondary | ICD-10-CM | POA: Diagnosis not present

## 2022-05-12 LAB — RENAL FUNCTION PANEL
Albumin: 3.1 g/dL — ABNORMAL LOW (ref 3.5–5.0)
Anion gap: 11 (ref 5–15)
BUN: 36 mg/dL — ABNORMAL HIGH (ref 8–23)
CO2: 34 mmol/L — ABNORMAL HIGH (ref 22–32)
Calcium: 8.8 mg/dL — ABNORMAL LOW (ref 8.9–10.3)
Chloride: 93 mmol/L — ABNORMAL LOW (ref 98–111)
Creatinine, Ser: 0.97 mg/dL (ref 0.44–1.00)
GFR, Estimated: 59 mL/min — ABNORMAL LOW (ref >60)
Glucose, Bld: 126 mg/dL — ABNORMAL HIGH (ref 70–99)
Phosphorus: 3.7 mg/dL (ref 2.5–4.6)
Potassium: 4 mmol/L (ref 3.5–5.1)
Sodium: 138 mmol/L (ref 135–145)

## 2022-05-12 LAB — CBC
HCT: 38.2 % (ref 36.0–46.0)
Hemoglobin: 11.7 g/dL — ABNORMAL LOW (ref 12.0–15.0)
MCH: 28.5 pg (ref 26.0–34.0)
MCHC: 30.6 g/dL (ref 30.0–36.0)
MCV: 93.2 fL (ref 80.0–100.0)
Platelets: 218 10*3/uL (ref 150–400)
RBC: 4.1 MIL/uL (ref 3.87–5.11)
RDW: 16.2 % — ABNORMAL HIGH (ref 11.5–15.5)
WBC: 8 10*3/uL (ref 4.0–10.5)
nRBC: 0 % (ref 0.0–0.2)

## 2022-05-12 MED ORDER — PREDNISONE 20 MG PO TABS: 40.0000 mg | ORAL_TABLET | Freq: Every day | ORAL | 0 refills | Status: AC

## 2022-05-12 MED ORDER — TORSEMIDE 20 MG PO TABS: 40.0000 mg | ORAL_TABLET | ORAL | 2 refills | Status: DC

## 2022-05-12 MED ORDER — POTASSIUM CHLORIDE ER 10 MEQ PO TBCR: 10.0000 meq | EXTENDED_RELEASE_TABLET | Freq: Every day | ORAL | 2 refills | Status: AC

## 2022-05-12 MED ORDER — POLYETHYLENE GLYCOL 3350 17 G PO PACK: 17.0000 g | PACK | Freq: Every day | ORAL | 2 refills | Status: DC

## 2022-05-12 MED ORDER — FUROSEMIDE 10 MG/ML IJ SOLN
40.0000 mg | Freq: Once | INTRAMUSCULAR | Status: AC
  Administered 2022-05-12: 40 mg via INTRAVENOUS
  Filled 2022-05-12: qty 4

## 2022-05-12 MED ORDER — ASPIRIN EC 81 MG PO TBEC: 81.0000 mg | DELAYED_RELEASE_TABLET | Freq: Every day | ORAL | 1 refills | Status: DC

## 2022-05-12 MED ORDER — DM-GUAIFENESIN ER 30-600 MG PO TB12: 1.0000 | ORAL_TABLET | Freq: Two times a day (BID) | ORAL | 0 refills | Status: AC

## 2022-05-12 NOTE — Progress Notes (Signed)
Patient rested well during PM shift. Wore CPAP all night. Wound care completed.

## 2022-05-12 NOTE — Care Management Important Message (Signed)
Important Message  Patient Details  Name: Dawn Gonzales MRN: [id number] Date of Birth: [birth year]   Medicare Important Message Given:  N/A - LOS <3 / Initial given by admissions     Tommy Medal 05/12/2022, 11:38 AM

## 2022-05-12 NOTE — TOC Transition Note (Addendum)
Transition of Care [facility]) - CM/SW Discharge Note   Patient Details  Name: Dawn Gonzales MRN: [id number] Date of Birth: 1940/09/29  Transition of Care [facility]) CM/SW Contact:  Iona Beard, [place] Phone Number: 05/12/2022, 11:45 AM   Clinical Narrative:    CSW spoke to [facility] with [facility] who states they have confirmed pts Cpap is out for delivery to the facility today. Tammy states facility is ready for pt to arrive. CSW updated pts daughter of plan for D/C. CSW to complete med necessity and send to floor for RN. CSW provided RN with numbers for room and report. CSW called and placed pt on EMS transport list. TOC signing off.   Final next level of care: Skilled Nursing Facility Barriers to Discharge: Barriers Resolved   Patient Goals and CMS Choice CMS Medicare.gov Compare Post Acute Care list provided to:: Patient Represenative (must comment) Choice offered to / list presented to : Adult Children  Discharge Placement                Patient chooses bed at: [facility] Patient to be transferred to facility by: EMS Name of family member notified: cavanaugh,sara (daughter) Patient and family notified of of transfer: 05/12/22  Discharge Plan and Services Additional resources added to the After Visit Summary for                                       Social Determinants of Health (SDOH) Interventions SDOH Screenings   Food Insecurity: No Food Insecurity (05/09/2022)  Housing: Low Risk  (05/09/2022)  Transportation Needs: No Transportation Needs (05/09/2022)  Utilities: Not At Risk (05/09/2022)  Tobacco Use: Medium Risk (05/09/2022)     Readmission Risk Interventions    05/11/2022    2:42 PM  Readmission Risk Prevention Plan  Post Dischage Appt Not Complete  Medication Screening Complete  Transportation Screening Complete

## 2022-05-12 NOTE — Discharge Summary (Signed)
Dawn Gonzales, is a 82 y.o. female  DOB 08-21-1940  MRN [id number].  Admission date:  05/09/2022  Admitting Physician  Roxan Hockey, MD  Discharge Date:  05/12/2022   Primary MD  [name], MD  Recommendations for primary care physician for things to follow:   1)Patient Needs Cpap and sleep study as soon as possible/-Strongly recommend outpatient sleep study so that patient can get CPAP to be used nightly 2)Very low-salt diet advised 3)Weigh yourself daily, call if you gain more than 3 pounds in 1 day or more than 5 pounds in 1 week as your diuretic medications may need to be adjusted 4)Limit your Fluid  intake to no more than 60 ounces (1.8 Liters) per day 5)Avoid ibuprofen/Advil/Aleve/Motrin/Goody Powders/Naproxen/BC powders/Meloxicam/Diclofenac/Indomethacin and other Nonsteroidal anti-inflammatory medications as these will make you more likely to bleed and can cause stomach ulcers, can also cause Kidney problems. 6)Sacral Decubital ulcer/Pressure injury-Stage 2 Decubitus ulcer to right buttock/sacral area.   --Wound care consult appreciated, recommends Silver hydrofiber for recalcitrant wound with drainage daily and Silicone foam to protect open areas over the sacral region  Admission Diagnosis  Acute respiratory failure with hypoxia ([place]) [J96.01] Acute on chronic respiratory failure with hypoxia ([place]) [J96.21] Acute on chronic hypoxic respiratory failure (HCC) [J96.21] Acute exacerbation of CHF (congestive heart failure) ([place]) [I50.9]   Discharge Diagnosis  Acute respiratory failure with hypoxia ([place]) [J96.01] Acute on chronic respiratory failure with hypoxia (HCC) [J96.21] Acute on chronic hypoxic respiratory failure (HCC) [J96.21] Acute exacerbation of CHF (congestive heart failure) ([place]) [I50.9]    Principal Problem:   Acute on chronic respiratory failure with hypoxia (HCC) Active  Problems:   Physical deconditioning   Obesity, Class III, BMI 40-49.9 (morbid obesity) ([place])   Sleep apnea   Hypertension   Chronic diastolic CHF (congestive heart failure), NYHA class 2 (HCC)   S/P TAVR (transcatheter aortic valve replacement)   Decubital ulcer   Acute exacerbation of CHF (congestive heart failure) ([place])      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   26 mm Edwards Sapien 3 transcatheter heart valve placed via percutaneous right transfemoral approach    Severe aortic stenosis    Sleep apnea     Past Surgical History:  Procedure Laterality Date   Abdominal gangrene     ADENOIDECTOMY     Cataract surgery     COLONOSCOPY     ENDOSCOPIC RETROGRADE CHOLANGIOPANCREATOGRAPHY (ERCP) WITH PROPOFOL N/A 02/08/2018   Procedure: ENDOSCOPIC RETROGRADE CHOLANGIOPANCREATOGRAPHY (ERCP) WITH  PROPOFOL;  Surgeon: Lemar Lofty., MD;  Location: [facility] ENDOSCOPY;  Service: Gastroenterology;  Laterality: N/A;   EUS  02/08/2018   Procedure: UPPER ENDOSCOPIC ULTRASOUND (EUS) LINEAR;  Surgeon: Meridee Score Netty Starring., MD;  Location: [facility] ENDOSCOPY;  Service: Gastroenterology;;   EYE SURGERY     IR FLUORO RM 30-60 MIN  03/27/2018   IR PERC CHOLECYSTOSTOMY  02/09/2018   IR RADIOLOGIST EVAL & MGMT  03/26/2018   IR RADIOLOGY PERIPHERAL GUIDED IV START  05/22/2018   IR RADIOLOGY PERIPHERAL GUIDED IV START  05/30/2018   IR RADIOLOGY PERIPHERAL GUIDED IV START  06/05/2018   IR US GUIDE VASC ACCESS RIGHT  05/22/2018   IR US GUIDE VASC ACCESS RIGHT  05/30/2018   IR US GUIDE VASC ACCESS  RIGHT  06/05/2018   PACEMAKER IMPLANT N/A 07/10/2018   Procedure: PACEMAKER IMPLANT;  Surgeon: Marinus Maw, MD;  Location: [facility] INVASIVE CV LAB;  Service: Cardiovascular;  Laterality: N/A;   REMOVAL OF STONES  02/08/2018   Procedure: REMOVAL OF STONES;  Surgeon: Meridee Score Netty Starring., MD;  Location: [facility] ENDOSCOPY;  Service: Gastroenterology;;   Dennison Mascot  02/08/2018   Procedure: Dennison Mascot;  Surgeon: Mansouraty, Netty Starring., MD;  Location: [facility] ENDOSCOPY;  Service: Gastroenterology;;   TONSILLECTOMY     TOOTH EXTRACTION N/A 06/18/2018   Procedure: DENTAL RESTORATION/EXTRACTIONS;  Surgeon: Ocie Doyne, DDS;  Location: [facility] OR;  Service: Oral Surgery;  Laterality: N/A;   TRANSCATHETER AORTIC VALVE REPLACEMENT, TRANSFEMORAL N/A 07/09/2018   Procedure: TRANSCATHETER AORTIC VALVE REPLACEMENT, TRANSFEMORAL;  Surgeon: Tonny Bollman, MD;  Location: [facility] INVASIVE CV LAB;  Service: Cardiovascular;  Laterality: N/A;   Tummy tuck       HPI  from the history and physical done on the day of admission:   Chief Complaint: Weakness, Falls   HPI: Dawn Gonzales is a 82 y.o. female with medical history significant for TAVR, complete heart block, diastolic CHF, hypertension, on chronic 3 L of O2. Patient presented to the ED with complaints of weakness and falling.  Per chart notes he also reported difficulty breathing, but patient tells me her breathing is fine.  She reports she has fallen 3 times since she was discharged from nursing home 2 days ago.  She reports she felt weak after rehab stay, did not feel ready to go back home.  She lives alone..  She tells me she developed a pressure ulcer on her buttock during her rehab stay also also, ulcer is without drainage. She denies cough, no chest pain.  No lower extremity swelling.  She has been compliant with her torsemide.  She is on CPAP nightly and is pleased with nightly since she was discharged from nursing home.   Recent hospitalization 12/6 to  12/8 for decompensated CHF.   ED Course: O2 sats 80% on room air, she was initially placed on BiPAP.  Respiratory rate 16-27.  Blood pressure systolic 120s to 932T. Chest x-ray -question of right lower lobe atelectasis.  And improvement in vascular congestion. Solu-Medrol 125 mg, DuoNebs given in ED.  Hospitalist admit for acute hypoxic respiratory failure.   Review of Systems: As per HPI all other systems reviewed and negative.   Hospital Course:    Brief Narrative:   82 y.o. female with medical history significant for TAVR, complete heart block, diastolic CHF, hypertension, on chronic 3 L of O2 re-admitted on 05/09/22 with recurrent falls and acute on chronic hypoxic respiratory failure secondary to acute on chronic diastolic CHF exacerbation     -  Assessment and Plan:   1)Acute on chronic respiratory failure with hypoxia -POA On 3 L chronically.  -Respiratory status improved after BiPAP use, steroids and nebulizer treatments -Strongly recommend outpatient sleep study so that patient can get CPAP to be used nightly -Chest x-ray without pneumonia, - WBC WNL,  -No fevers or evidence of frank respiratory infection -Respiratory status improving -Discharge to rehab on 4 L of oxygen via nasal cannula  2)Morbid Obesity/OSA -Suspect OSA and possible OHS -Suspect some degree of chronic hypercapnia -Strongly recommend outpatient sleep study so that patient can get CPAP to be used nightly -Low calorie diet, portion control and increase physical activity discussed with patient -Body mass index is 43.7 kg/m.   3)Chronic diastolic CHF (congestive heart failure), NYHA class 2 ([place]) -Chest x-ray and BNP does not suggest significant CHF exacerbation -Echo 10/2021, EF 55 to 60%, G1DD. -Chronic lower extremity edema noted -Discharged on torsemide 40 mg every morning and 20 mg every afternoon   4)Generalized weakness and deconditioning with Recurrent Falls---PTA pt lived alone and did very poorly,  patient has significant limitations with mobility related ADLs- this patient needs to continue to be monitored in the hospital until a SNF bed is obtained as she is not safe to go home with her current physcical limitations -Patient was admitted in the hospital from 04/05/22 thru 04/07/22, went to SNF rehab from 04/07/22 thru 05/07/22 -Has had recurrent Falls since dc from SNF on 05/07/22 -Physical therapy eval appreciated recommends SNF rehab -Discharge back to SNF rehab on 05/12/22   5)Decubital ulcer/PI---POA Stage 2 Decubitus ulcer to right buttock/sacral area.  No evidence of surrounding cellulitis. -Wound care consult appreciated, recommends Silver hydrofiber for recalcitrant wound with drainage daily and Silicone foam to protect open areas over the sacral region    6)Depression----stable, continue Effexor   Disposition: The patient is from: Home              Anticipated d/c is to: SNF  Discharge Condition: -stable  Follow UP   Contact information for after-discharge care     Destination     [facility] Preferred SNF .   Service: Skilled Nursing Contact information: [address] [place] [phone]                    Diet and Activity recommendation:  As advised  Discharge Instructions    Discharge Instructions     Call MD for:  difficulty breathing, headache or visual disturbances   Complete by: As directed    Call MD for:  persistant dizziness or light-headedness   Complete by: As directed    Call MD for:  persistant nausea and vomiting   Complete by: As directed    Call MD for:  redness, tenderness, or signs of infection (pain, swelling, redness, odor or green/yellow discharge around incision site)   Complete by: As directed    Call MD for:  temperature >100.4   Complete by: As directed    Diet - low sodium heart healthy   Complete by: As directed    Discharge instructions   Complete by: As directed     1)Patient Needs Cpap and sleep study as soon as possible 2)Very low-salt diet advised 3)Weigh yourself daily, call if you gain more than 3 pounds in 1 day or more than 5 pounds in 1 week as your diuretic medications may need to be adjusted 4)Limit your Fluid  intake to no more than 60  ounces (1.8 Liters) per day 5)Avoid ibuprofen/Advil/Aleve/Motrin/Goody Powders/Naproxen/BC powders/Meloxicam/Diclofenac/Indomethacin and other Nonsteroidal anti-inflammatory medications as these will make you more likely to bleed and can cause stomach ulcers, can also cause Kidney problems. 6)Sacral Decubital ulcer/Pressure injury-Stage 2 Decubitus ulcer to right buttock/sacral area.   --Wound care consult appreciated, recommends Silver hydrofiber for recalcitrant wound with drainage daily and Silicone foam to protect open areas over the sacral region   Discharge wound care:   Complete by: As directed    Sacral Decubital ulcer/Pressure injury-Stage 2 Decubitus ulcer to right buttock/sacral area.   --Wound care consult appreciated, recommends Silver hydrofiber for recalcitrant wound with drainage daily and Silicone foam to protect open areas over the sacral region   Increase activity slowly   Complete by: As directed         Discharge Medications     Allergies as of 05/12/2022   No Known Allergies      Medication List     STOP taking these medications    methylPREDNISolone 4 MG Tbpk tablet Commonly known as: MEDROL DOSEPAK       TAKE these medications    acetaminophen 325 MG tablet Commonly known as: TYLENOL Take 2 tablets (650 mg total) by mouth every 6 (six) hours as needed for mild pain, fever or headache (or Fever >/= 101).   albuterol 108 (90 Base) MCG/ACT inhaler Commonly known as: VENTOLIN HFA Inhale 2 puffs into the lungs every 6 (six) hours as needed.   ascorbic acid 500 MG tablet Commonly known as: VITAMIN C Take 500 mg by mouth daily.   aspirin EC 81 MG tablet Take 1 tablet  (81 mg total) by mouth daily with breakfast.   CALCIUM-CARB 600 PO Take 600 mg by mouth daily.   cholecalciferol 1000 units tablet Commonly known as: VITAMIN D Take 1,000 Units by mouth daily.   dextromethorphan-guaiFENesin 30-600 MG 12hr tablet Commonly known as: MUCINEX DM Take 1 tablet by mouth 2 (two) times daily.   ferrous sulfate 325 (65 FE) MG tablet Take 325 mg by mouth daily with breakfast.   gabapentin 300 MG capsule Commonly known as: NEURONTIN Take 300 mg by mouth 3 (three) times daily.   ipratropium-albuterol 0.5-2.5 (3) MG/3ML Soln Commonly known as: DUONEB Take 3 mLs by nebulization 2 (two) times daily.   multivitamin with minerals Tabs tablet Take 1 tablet by mouth daily.   nystatin powder Commonly known as: MYCOSTATIN/NYSTOP APPLY TO AFFECTED AREA TWICE A DAY   polyethylene glycol 17 g packet Commonly known as: MiraLax Mix-In Pax Take 17 g by mouth daily. What changed:  when to take this reasons to take this   potassium chloride 10 MEQ tablet Commonly known as: KLOR-CON Take 1 tablet (10 mEq total) by mouth daily. Take While taking Demadex/Torsemide   pravastatin 20 MG tablet Commonly known as: PRAVACHOL Take 20 mg by mouth daily.   predniSONE 20 MG tablet Commonly known as: DELTASONE Take 2 tablets (40 mg total) by mouth daily with breakfast for 5 days.   torsemide 20 MG tablet Commonly known as: DEMADEX Take 2 tablets (40 mg total) by mouth See admin instructions. Take 2 tab (40 mg) every morning and 1 tab What changed:  when to take this additional instructions   venlafaxine 37.5 MG tablet Commonly known as: EFFEXOR Take 37.5 mg by mouth daily.               Discharge Care Instructions  (From admission, onward)  Start     Ordered   05/12/22 0000  Discharge wound care:       Comments: Sacral Decubital ulcer/Pressure injury-Stage 2 Decubitus ulcer to right buttock/sacral area.   --Wound care consult appreciated,  recommends Silver hydrofiber for recalcitrant wound with drainage daily and Silicone foam to protect open areas over the sacral region   05/12/22 1036           Major procedures and Radiology Reports - PLEASE review detailed and final reports for all details, in brief -   DG Chest Portable 1 View  Result Date: 05/09/2022 CLINICAL DATA:  Dyspnea EXAM: PORTABLE CHEST 1 VIEW COMPARISON:  Chest 04/18/2022 FINDINGS: Cardiac enlargement.  Postop TAVR.  Dual lead pacemaker unchanged. Elevated right hemidiaphragm has progressed. Right lower lobe atelectasis has progressed. Improvement in vascular congestion. No edema or significant effusion. IMPRESSION: 1. Progression of right lower lobe atelectasis. 2. Improvement in vascular congestion. Electronically Signed   By: Marlan Palau M.D.   On: 05/09/2022 10:54    Micro Results   Recent Results (from the past 240 hour(s))  Resp panel by RT-PCR (RSV, Flu A&B, Covid) Anterior Nasal Swab     Status: None   Collection Time: 05/09/22 11:03 AM   Specimen: Anterior Nasal Swab  Result Value Ref Range Status   SARS Coronavirus 2 by RT PCR NEGATIVE NEGATIVE Final    Comment: (NOTE) SARS-CoV-2 target nucleic acids are NOT DETECTED.  The SARS-CoV-2 RNA is generally detectable in upper respiratory specimens during the acute phase of infection. The lowest concentration of SARS-CoV-2 viral copies this assay can detect is 138 copies/mL. A negative result does not preclude SARS-Cov-2 infection and should not be used as the sole basis for treatment or other patient management decisions. A negative result may occur with  improper specimen collection/handling, submission of specimen other than nasopharyngeal swab, presence of viral mutation(s) within the areas targeted by this assay, and inadequate number of viral copies(<138 copies/mL). A negative result must be combined with clinical observations, patient history, and epidemiological information. The expected  result is Negative.  Fact Sheet for Patients:  BloggerCourse.com  Fact Sheet for Healthcare Providers:  SeriousBroker.it  This test is no t yet approved or cleared by the [place] FDA and  has been authorized for detection and/or diagnosis of SARS-CoV-2 by FDA under an Emergency Use Authorization (EUA). This EUA will remain  in effect (meaning this test can be used) for the duration of the COVID-19 declaration under Section 564(b)(1) of the Act, 21 U.S.C.section 360bbb-3(b)(1), unless the authorization is terminated  or revoked sooner.       Influenza A by PCR NEGATIVE NEGATIVE Final   Influenza B by PCR NEGATIVE NEGATIVE Final    Comment: (NOTE) The Xpert Xpress SARS-CoV-2/FLU/RSV plus assay is intended as an aid in the diagnosis of influenza from Nasopharyngeal swab specimens and should not be used as a sole basis for treatment. Nasal washings and aspirates are unacceptable for Xpert Xpress SARS-CoV-2/FLU/RSV testing.  Fact Sheet for Patients: BloggerCourse.com  Fact Sheet for Healthcare Providers: SeriousBroker.it  This test is not yet approved or cleared by the [place] FDA and has been authorized for detection and/or diagnosis of SARS-CoV-2 by FDA under an Emergency Use Authorization (EUA). This EUA will remain in effect (meaning this test can be used) for the duration of the COVID-19 declaration under Section 564(b)(1) of the Act, 21 U.S.C. section 360bbb-3(b)(1), unless the authorization is terminated or revoked.     Resp Syncytial  Virus by PCR NEGATIVE NEGATIVE Final    Comment: (NOTE) Fact Sheet for Patients: BloggerCourse.com  Fact Sheet for Healthcare Providers: SeriousBroker.it  This test is not yet approved or cleared by the [place] FDA and has been authorized for detection and/or diagnosis of  SARS-CoV-2 by FDA under an Emergency Use Authorization (EUA). This EUA will remain in effect (meaning this test can be used) for the duration of the COVID-19 declaration under Section 564(b)(1) of the Act, 21 U.S.C. section 360bbb-3(b)(1), unless the authorization is terminated or revoked.  Performed at [facility], [address], [place], [place] [address]    Today   Subjective    Dawn Gonzales today has no new complaints  No fever  Or chills   No Nausea, Vomiting or Diarrhea -    -Dyspnea has improved -Hypoxia persist    Patient has been seen and examined prior to discharge   Objective   Blood pressure (!) 143/63, pulse 65, temperature 97.8 F (36.6 C), temperature source Axillary, resp. rate 20, height 5\' 6"  (1.676 m), weight 122.8 kg, SpO2 92 %.   Intake/Output Summary (Last 24 hours) at 05/12/2022 1043 Last data filed at 05/12/2022 0900 Gross per 24 hour  Intake 480 ml  Output 1200 ml  Net -720 ml    Exam Gen:- Awake Alert, no acute distress , morbidly obese, no conversational dyspnea HEENT:- [place].AT, No sclera icterus Nose- [place] 4L/min Neck-Supple Neck,No JVD,.  Lungs-improved air movement, no wheezing  CV- S1, S2 normal, regular Abd-  +ve B.Sounds, Abd Soft, No tenderness,    Extremity/Skin:- improving  edema,   good pulses Psych-affect is appropriate, oriented x3 Neuro-Generalized weakness, no new focal deficits, no tremors    Data Review   CBC w Diff:  Lab Results  Component Value Date   WBC 8.0 05/12/2022   HGB 11.7 (L) 05/12/2022   HCT 38.2 05/12/2022   PLT 218 05/12/2022   LYMPHOPCT 21 05/09/2022   MONOPCT 8 05/09/2022   EOSPCT 2 05/09/2022   BASOPCT 1 05/09/2022   CMP:  Lab Results  Component Value Date   NA 138 05/12/2022   NA 141 05/10/2018   K 4.0 05/12/2022   CL 93 (L) 05/12/2022   CO2 34 (H) 05/12/2022   BUN 36 (H) 05/12/2022   BUN 27 05/10/2018   CREATININE 0.97 05/12/2022   PROT 6.9 05/09/2022   ALBUMIN 3.1 (L)  05/12/2022   BILITOT 0.5 05/09/2022   ALKPHOS 75 05/09/2022   AST 26 05/09/2022   ALT 19 05/09/2022   Total Discharge time is about 33 minutes  07/08/2022 M.D on 05/12/2022 at 10:43 AM  Go to [email or web address] -  for contact info  [facility] - Office  [phone]

## 2022-05-12 NOTE — Progress Notes (Signed)
IV removed and report called to nurse, Apolonio Schneiders, at the [facility] in [place].  Daughter, also,contacted that patient has left by EMS.

## 2022-05-12 NOTE — Plan of Care (Signed)

## 2022-05-15 DIAGNOSIS — M6281 Muscle weakness (generalized): Secondary | ICD-10-CM | POA: Diagnosis not present

## 2022-05-16 DIAGNOSIS — E559 Vitamin D deficiency, unspecified: Secondary | ICD-10-CM | POA: Diagnosis not present

## 2022-05-16 DIAGNOSIS — I5032 Chronic diastolic (congestive) heart failure: Secondary | ICD-10-CM | POA: Diagnosis not present

## 2022-05-16 DIAGNOSIS — J9611 Chronic respiratory failure with hypoxia: Secondary | ICD-10-CM | POA: Diagnosis not present

## 2022-05-16 DIAGNOSIS — G4733 Obstructive sleep apnea (adult) (pediatric): Secondary | ICD-10-CM | POA: Diagnosis not present

## 2022-05-17 ENCOUNTER — Ambulatory Visit: Payer: Medicare PPO | Admitting: Cardiology

## 2022-05-18 DIAGNOSIS — I639 Cerebral infarction, unspecified: Secondary | ICD-10-CM | POA: Diagnosis not present

## 2022-05-18 DIAGNOSIS — L89133 Pressure ulcer of right lower back, stage 3: Secondary | ICD-10-CM | POA: Diagnosis not present

## 2022-05-18 DIAGNOSIS — I5032 Chronic diastolic (congestive) heart failure: Secondary | ICD-10-CM | POA: Diagnosis not present

## 2022-05-18 DIAGNOSIS — J9611 Chronic respiratory failure with hypoxia: Secondary | ICD-10-CM | POA: Diagnosis not present

## 2022-05-18 DIAGNOSIS — D509 Iron deficiency anemia, unspecified: Secondary | ICD-10-CM | POA: Diagnosis not present

## 2022-05-18 DIAGNOSIS — N1831 Chronic kidney disease, stage 3a: Secondary | ICD-10-CM | POA: Diagnosis not present

## 2022-05-18 DIAGNOSIS — G629 Polyneuropathy, unspecified: Secondary | ICD-10-CM | POA: Diagnosis not present

## 2022-05-19 NOTE — Progress Notes (Signed)
Remote pacemaker transmission.   

## 2022-05-23 DIAGNOSIS — L03119 Cellulitis of unspecified part of limb: Secondary | ICD-10-CM | POA: Diagnosis not present

## 2022-05-25 DIAGNOSIS — G629 Polyneuropathy, unspecified: Secondary | ICD-10-CM | POA: Diagnosis not present

## 2022-05-25 DIAGNOSIS — I639 Cerebral infarction, unspecified: Secondary | ICD-10-CM | POA: Diagnosis not present

## 2022-05-25 DIAGNOSIS — J9611 Chronic respiratory failure with hypoxia: Secondary | ICD-10-CM | POA: Diagnosis not present

## 2022-05-25 DIAGNOSIS — I5032 Chronic diastolic (congestive) heart failure: Secondary | ICD-10-CM | POA: Diagnosis not present

## 2022-05-25 DIAGNOSIS — L03119 Cellulitis of unspecified part of limb: Secondary | ICD-10-CM | POA: Diagnosis not present

## 2022-05-25 DIAGNOSIS — I87333 Chronic venous hypertension (idiopathic) with ulcer and inflammation of bilateral lower extremity: Secondary | ICD-10-CM | POA: Diagnosis not present

## 2022-05-25 DIAGNOSIS — L89133 Pressure ulcer of right lower back, stage 3: Secondary | ICD-10-CM | POA: Diagnosis not present

## 2022-05-30 DIAGNOSIS — J9611 Chronic respiratory failure with hypoxia: Secondary | ICD-10-CM | POA: Diagnosis not present

## 2022-05-30 DIAGNOSIS — L03119 Cellulitis of unspecified part of limb: Secondary | ICD-10-CM | POA: Diagnosis not present

## 2022-05-30 DIAGNOSIS — L89313 Pressure ulcer of right buttock, stage 3: Secondary | ICD-10-CM | POA: Diagnosis not present

## 2022-06-07 DIAGNOSIS — L03119 Cellulitis of unspecified part of limb: Secondary | ICD-10-CM | POA: Diagnosis not present

## 2022-06-07 DIAGNOSIS — J9611 Chronic respiratory failure with hypoxia: Secondary | ICD-10-CM | POA: Diagnosis not present

## 2022-06-12 DIAGNOSIS — L89313 Pressure ulcer of right buttock, stage 3: Secondary | ICD-10-CM | POA: Diagnosis not present

## 2022-06-12 DIAGNOSIS — N1831 Chronic kidney disease, stage 3a: Secondary | ICD-10-CM | POA: Diagnosis not present

## 2022-06-12 DIAGNOSIS — J9611 Chronic respiratory failure with hypoxia: Secondary | ICD-10-CM | POA: Diagnosis not present

## 2022-06-12 DIAGNOSIS — I5032 Chronic diastolic (congestive) heart failure: Secondary | ICD-10-CM | POA: Diagnosis not present

## 2022-06-15 DIAGNOSIS — G629 Polyneuropathy, unspecified: Secondary | ICD-10-CM | POA: Diagnosis not present

## 2022-06-15 DIAGNOSIS — I5032 Chronic diastolic (congestive) heart failure: Secondary | ICD-10-CM | POA: Diagnosis not present

## 2022-06-15 DIAGNOSIS — L89133 Pressure ulcer of right lower back, stage 3: Secondary | ICD-10-CM | POA: Diagnosis not present

## 2022-06-15 DIAGNOSIS — I639 Cerebral infarction, unspecified: Secondary | ICD-10-CM | POA: Diagnosis not present

## 2022-06-16 DIAGNOSIS — M170 Bilateral primary osteoarthritis of knee: Secondary | ICD-10-CM | POA: Diagnosis not present

## 2022-06-16 DIAGNOSIS — M25562 Pain in left knee: Secondary | ICD-10-CM | POA: Diagnosis not present

## 2022-06-16 DIAGNOSIS — G8929 Other chronic pain: Secondary | ICD-10-CM | POA: Diagnosis not present

## 2022-06-16 DIAGNOSIS — M25461 Effusion, right knee: Secondary | ICD-10-CM | POA: Diagnosis not present

## 2022-06-23 DIAGNOSIS — G629 Polyneuropathy, unspecified: Secondary | ICD-10-CM | POA: Diagnosis not present

## 2022-06-23 DIAGNOSIS — I5032 Chronic diastolic (congestive) heart failure: Secondary | ICD-10-CM | POA: Diagnosis not present

## 2022-06-23 DIAGNOSIS — E559 Vitamin D deficiency, unspecified: Secondary | ICD-10-CM | POA: Diagnosis not present

## 2022-06-23 DIAGNOSIS — L03119 Cellulitis of unspecified part of limb: Secondary | ICD-10-CM | POA: Diagnosis not present

## 2022-06-27 DIAGNOSIS — I890 Lymphedema, not elsewhere classified: Secondary | ICD-10-CM | POA: Diagnosis not present

## 2022-06-27 DIAGNOSIS — I4510 Unspecified right bundle-branch block: Secondary | ICD-10-CM | POA: Diagnosis not present

## 2022-06-27 DIAGNOSIS — I5032 Chronic diastolic (congestive) heart failure: Secondary | ICD-10-CM | POA: Diagnosis not present

## 2022-06-27 DIAGNOSIS — I872 Venous insufficiency (chronic) (peripheral): Secondary | ICD-10-CM | POA: Diagnosis not present

## 2022-06-27 DIAGNOSIS — G629 Polyneuropathy, unspecified: Secondary | ICD-10-CM | POA: Diagnosis not present

## 2022-06-27 DIAGNOSIS — J9611 Chronic respiratory failure with hypoxia: Secondary | ICD-10-CM | POA: Diagnosis not present

## 2022-06-27 DIAGNOSIS — I110 Hypertensive heart disease with heart failure: Secondary | ICD-10-CM | POA: Diagnosis not present

## 2022-06-27 DIAGNOSIS — M1990 Unspecified osteoarthritis, unspecified site: Secondary | ICD-10-CM | POA: Diagnosis not present

## 2022-06-27 DIAGNOSIS — I442 Atrioventricular block, complete: Secondary | ICD-10-CM | POA: Diagnosis not present

## 2022-06-29 DIAGNOSIS — I442 Atrioventricular block, complete: Secondary | ICD-10-CM | POA: Diagnosis not present

## 2022-06-29 DIAGNOSIS — I4510 Unspecified right bundle-branch block: Secondary | ICD-10-CM | POA: Diagnosis not present

## 2022-06-29 DIAGNOSIS — I890 Lymphedema, not elsewhere classified: Secondary | ICD-10-CM | POA: Diagnosis not present

## 2022-06-29 DIAGNOSIS — I872 Venous insufficiency (chronic) (peripheral): Secondary | ICD-10-CM | POA: Diagnosis not present

## 2022-06-29 DIAGNOSIS — J9611 Chronic respiratory failure with hypoxia: Secondary | ICD-10-CM | POA: Diagnosis not present

## 2022-06-29 DIAGNOSIS — I5032 Chronic diastolic (congestive) heart failure: Secondary | ICD-10-CM | POA: Diagnosis not present

## 2022-06-29 DIAGNOSIS — M1990 Unspecified osteoarthritis, unspecified site: Secondary | ICD-10-CM | POA: Diagnosis not present

## 2022-06-29 DIAGNOSIS — G629 Polyneuropathy, unspecified: Secondary | ICD-10-CM | POA: Diagnosis not present

## 2022-06-29 DIAGNOSIS — I110 Hypertensive heart disease with heart failure: Secondary | ICD-10-CM | POA: Diagnosis not present

## 2022-06-30 DIAGNOSIS — J9611 Chronic respiratory failure with hypoxia: Secondary | ICD-10-CM | POA: Diagnosis not present

## 2022-06-30 DIAGNOSIS — I442 Atrioventricular block, complete: Secondary | ICD-10-CM | POA: Diagnosis not present

## 2022-06-30 DIAGNOSIS — I872 Venous insufficiency (chronic) (peripheral): Secondary | ICD-10-CM | POA: Diagnosis not present

## 2022-06-30 DIAGNOSIS — M1990 Unspecified osteoarthritis, unspecified site: Secondary | ICD-10-CM | POA: Diagnosis not present

## 2022-06-30 DIAGNOSIS — I4510 Unspecified right bundle-branch block: Secondary | ICD-10-CM | POA: Diagnosis not present

## 2022-06-30 DIAGNOSIS — I5032 Chronic diastolic (congestive) heart failure: Secondary | ICD-10-CM | POA: Diagnosis not present

## 2022-06-30 DIAGNOSIS — I110 Hypertensive heart disease with heart failure: Secondary | ICD-10-CM | POA: Diagnosis not present

## 2022-06-30 DIAGNOSIS — I890 Lymphedema, not elsewhere classified: Secondary | ICD-10-CM | POA: Diagnosis not present

## 2022-06-30 DIAGNOSIS — G629 Polyneuropathy, unspecified: Secondary | ICD-10-CM | POA: Diagnosis not present

## 2022-07-04 DIAGNOSIS — G44009 Cluster headache syndrome, unspecified, not intractable: Secondary | ICD-10-CM | POA: Diagnosis not present

## 2022-07-06 DIAGNOSIS — J9611 Chronic respiratory failure with hypoxia: Secondary | ICD-10-CM | POA: Diagnosis not present

## 2022-07-06 DIAGNOSIS — M1990 Unspecified osteoarthritis, unspecified site: Secondary | ICD-10-CM | POA: Diagnosis not present

## 2022-07-06 DIAGNOSIS — G629 Polyneuropathy, unspecified: Secondary | ICD-10-CM | POA: Diagnosis not present

## 2022-07-06 DIAGNOSIS — I890 Lymphedema, not elsewhere classified: Secondary | ICD-10-CM | POA: Diagnosis not present

## 2022-07-06 DIAGNOSIS — I5032 Chronic diastolic (congestive) heart failure: Secondary | ICD-10-CM | POA: Diagnosis not present

## 2022-07-06 DIAGNOSIS — I4510 Unspecified right bundle-branch block: Secondary | ICD-10-CM | POA: Diagnosis not present

## 2022-07-06 DIAGNOSIS — I442 Atrioventricular block, complete: Secondary | ICD-10-CM | POA: Diagnosis not present

## 2022-07-06 DIAGNOSIS — I110 Hypertensive heart disease with heart failure: Secondary | ICD-10-CM | POA: Diagnosis not present

## 2022-07-06 DIAGNOSIS — I872 Venous insufficiency (chronic) (peripheral): Secondary | ICD-10-CM | POA: Diagnosis not present

## 2022-07-07 DIAGNOSIS — I872 Venous insufficiency (chronic) (peripheral): Secondary | ICD-10-CM | POA: Diagnosis not present

## 2022-07-07 DIAGNOSIS — I890 Lymphedema, not elsewhere classified: Secondary | ICD-10-CM | POA: Diagnosis not present

## 2022-07-07 DIAGNOSIS — I442 Atrioventricular block, complete: Secondary | ICD-10-CM | POA: Diagnosis not present

## 2022-07-07 DIAGNOSIS — I4510 Unspecified right bundle-branch block: Secondary | ICD-10-CM | POA: Diagnosis not present

## 2022-07-07 DIAGNOSIS — J9611 Chronic respiratory failure with hypoxia: Secondary | ICD-10-CM | POA: Diagnosis not present

## 2022-07-07 DIAGNOSIS — G629 Polyneuropathy, unspecified: Secondary | ICD-10-CM | POA: Diagnosis not present

## 2022-07-07 DIAGNOSIS — I5032 Chronic diastolic (congestive) heart failure: Secondary | ICD-10-CM | POA: Diagnosis not present

## 2022-07-07 DIAGNOSIS — I110 Hypertensive heart disease with heart failure: Secondary | ICD-10-CM | POA: Diagnosis not present

## 2022-07-07 DIAGNOSIS — M1990 Unspecified osteoarthritis, unspecified site: Secondary | ICD-10-CM | POA: Diagnosis not present

## 2022-07-10 ENCOUNTER — Ambulatory Visit (INDEPENDENT_AMBULATORY_CARE_PROVIDER_SITE_OTHER): Payer: Medicare PPO

## 2022-07-10 DIAGNOSIS — I495 Sick sinus syndrome: Secondary | ICD-10-CM | POA: Diagnosis not present

## 2022-07-11 DIAGNOSIS — I110 Hypertensive heart disease with heart failure: Secondary | ICD-10-CM | POA: Diagnosis not present

## 2022-07-11 DIAGNOSIS — L8996 Pressure-induced deep tissue damage of unspecified site: Secondary | ICD-10-CM | POA: Diagnosis not present

## 2022-07-11 DIAGNOSIS — M25561 Pain in right knee: Secondary | ICD-10-CM | POA: Diagnosis not present

## 2022-07-11 DIAGNOSIS — J9611 Chronic respiratory failure with hypoxia: Secondary | ICD-10-CM | POA: Diagnosis not present

## 2022-07-11 DIAGNOSIS — R600 Localized edema: Secondary | ICD-10-CM | POA: Diagnosis not present

## 2022-07-11 DIAGNOSIS — I509 Heart failure, unspecified: Secondary | ICD-10-CM | POA: Diagnosis not present

## 2022-07-11 DIAGNOSIS — Z7689 Persons encountering health services in other specified circumstances: Secondary | ICD-10-CM | POA: Diagnosis not present

## 2022-07-11 DIAGNOSIS — M25562 Pain in left knee: Secondary | ICD-10-CM | POA: Diagnosis not present

## 2022-07-12 DIAGNOSIS — J9611 Chronic respiratory failure with hypoxia: Secondary | ICD-10-CM | POA: Diagnosis not present

## 2022-07-12 DIAGNOSIS — I890 Lymphedema, not elsewhere classified: Secondary | ICD-10-CM | POA: Diagnosis not present

## 2022-07-12 DIAGNOSIS — I872 Venous insufficiency (chronic) (peripheral): Secondary | ICD-10-CM | POA: Diagnosis not present

## 2022-07-12 DIAGNOSIS — I4510 Unspecified right bundle-branch block: Secondary | ICD-10-CM | POA: Diagnosis not present

## 2022-07-12 DIAGNOSIS — G629 Polyneuropathy, unspecified: Secondary | ICD-10-CM | POA: Diagnosis not present

## 2022-07-12 DIAGNOSIS — I110 Hypertensive heart disease with heart failure: Secondary | ICD-10-CM | POA: Diagnosis not present

## 2022-07-12 DIAGNOSIS — M1990 Unspecified osteoarthritis, unspecified site: Secondary | ICD-10-CM | POA: Diagnosis not present

## 2022-07-12 DIAGNOSIS — I5032 Chronic diastolic (congestive) heart failure: Secondary | ICD-10-CM | POA: Diagnosis not present

## 2022-07-12 DIAGNOSIS — I442 Atrioventricular block, complete: Secondary | ICD-10-CM | POA: Diagnosis not present

## 2022-07-12 LAB — CUP PACEART REMOTE DEVICE CHECK
Date Time Interrogation Session: 20240311091442
Implantable Lead Connection Status: 753985
Implantable Lead Connection Status: 753985
Implantable Lead Implant Date: 20200311
Implantable Lead Implant Date: 20200311
Implantable Lead Location: 753859
Implantable Lead Location: 753860
Implantable Lead Model: 377
Implantable Lead Model: 377
Implantable Lead Serial Number: 81024123
Implantable Lead Serial Number: 81075341
Implantable Pulse Generator Implant Date: 20200311
Pulse Gen Model: 407145
Pulse Gen Serial Number: 69550148

## 2022-07-13 DIAGNOSIS — I4510 Unspecified right bundle-branch block: Secondary | ICD-10-CM | POA: Diagnosis not present

## 2022-07-13 DIAGNOSIS — I890 Lymphedema, not elsewhere classified: Secondary | ICD-10-CM | POA: Diagnosis not present

## 2022-07-13 DIAGNOSIS — I5032 Chronic diastolic (congestive) heart failure: Secondary | ICD-10-CM | POA: Diagnosis not present

## 2022-07-13 DIAGNOSIS — G629 Polyneuropathy, unspecified: Secondary | ICD-10-CM | POA: Diagnosis not present

## 2022-07-13 DIAGNOSIS — I110 Hypertensive heart disease with heart failure: Secondary | ICD-10-CM | POA: Diagnosis not present

## 2022-07-13 DIAGNOSIS — J9611 Chronic respiratory failure with hypoxia: Secondary | ICD-10-CM | POA: Diagnosis not present

## 2022-07-13 DIAGNOSIS — M1990 Unspecified osteoarthritis, unspecified site: Secondary | ICD-10-CM | POA: Diagnosis not present

## 2022-07-13 DIAGNOSIS — I442 Atrioventricular block, complete: Secondary | ICD-10-CM | POA: Diagnosis not present

## 2022-07-13 DIAGNOSIS — I872 Venous insufficiency (chronic) (peripheral): Secondary | ICD-10-CM | POA: Diagnosis not present

## 2022-07-17 DIAGNOSIS — I890 Lymphedema, not elsewhere classified: Secondary | ICD-10-CM | POA: Diagnosis not present

## 2022-07-17 DIAGNOSIS — I4510 Unspecified right bundle-branch block: Secondary | ICD-10-CM | POA: Diagnosis not present

## 2022-07-17 DIAGNOSIS — I5032 Chronic diastolic (congestive) heart failure: Secondary | ICD-10-CM | POA: Diagnosis not present

## 2022-07-17 DIAGNOSIS — G629 Polyneuropathy, unspecified: Secondary | ICD-10-CM | POA: Diagnosis not present

## 2022-07-17 DIAGNOSIS — M1990 Unspecified osteoarthritis, unspecified site: Secondary | ICD-10-CM | POA: Diagnosis not present

## 2022-07-17 DIAGNOSIS — I872 Venous insufficiency (chronic) (peripheral): Secondary | ICD-10-CM | POA: Diagnosis not present

## 2022-07-17 DIAGNOSIS — I442 Atrioventricular block, complete: Secondary | ICD-10-CM | POA: Diagnosis not present

## 2022-07-17 DIAGNOSIS — I110 Hypertensive heart disease with heart failure: Secondary | ICD-10-CM | POA: Diagnosis not present

## 2022-07-17 DIAGNOSIS — J9611 Chronic respiratory failure with hypoxia: Secondary | ICD-10-CM | POA: Diagnosis not present

## 2022-07-18 DIAGNOSIS — I110 Hypertensive heart disease with heart failure: Secondary | ICD-10-CM | POA: Diagnosis not present

## 2022-07-18 DIAGNOSIS — I442 Atrioventricular block, complete: Secondary | ICD-10-CM | POA: Diagnosis not present

## 2022-07-18 DIAGNOSIS — I5032 Chronic diastolic (congestive) heart failure: Secondary | ICD-10-CM | POA: Diagnosis not present

## 2022-07-18 DIAGNOSIS — M1990 Unspecified osteoarthritis, unspecified site: Secondary | ICD-10-CM | POA: Diagnosis not present

## 2022-07-18 DIAGNOSIS — I872 Venous insufficiency (chronic) (peripheral): Secondary | ICD-10-CM | POA: Diagnosis not present

## 2022-07-18 DIAGNOSIS — I890 Lymphedema, not elsewhere classified: Secondary | ICD-10-CM | POA: Diagnosis not present

## 2022-07-18 DIAGNOSIS — J9611 Chronic respiratory failure with hypoxia: Secondary | ICD-10-CM | POA: Diagnosis not present

## 2022-07-18 DIAGNOSIS — G629 Polyneuropathy, unspecified: Secondary | ICD-10-CM | POA: Diagnosis not present

## 2022-07-18 DIAGNOSIS — I4510 Unspecified right bundle-branch block: Secondary | ICD-10-CM | POA: Diagnosis not present

## 2022-07-21 DIAGNOSIS — M170 Bilateral primary osteoarthritis of knee: Secondary | ICD-10-CM | POA: Diagnosis not present

## 2022-07-21 DIAGNOSIS — M25461 Effusion, right knee: Secondary | ICD-10-CM | POA: Diagnosis not present

## 2022-07-21 DIAGNOSIS — G8929 Other chronic pain: Secondary | ICD-10-CM | POA: Diagnosis not present

## 2022-07-26 DIAGNOSIS — I4510 Unspecified right bundle-branch block: Secondary | ICD-10-CM | POA: Diagnosis not present

## 2022-07-26 DIAGNOSIS — I872 Venous insufficiency (chronic) (peripheral): Secondary | ICD-10-CM | POA: Diagnosis not present

## 2022-07-26 DIAGNOSIS — I890 Lymphedema, not elsewhere classified: Secondary | ICD-10-CM | POA: Diagnosis not present

## 2022-07-26 DIAGNOSIS — G629 Polyneuropathy, unspecified: Secondary | ICD-10-CM | POA: Diagnosis not present

## 2022-07-26 DIAGNOSIS — I110 Hypertensive heart disease with heart failure: Secondary | ICD-10-CM | POA: Diagnosis not present

## 2022-07-26 DIAGNOSIS — M1990 Unspecified osteoarthritis, unspecified site: Secondary | ICD-10-CM | POA: Diagnosis not present

## 2022-07-26 DIAGNOSIS — J9611 Chronic respiratory failure with hypoxia: Secondary | ICD-10-CM | POA: Diagnosis not present

## 2022-07-26 DIAGNOSIS — I5032 Chronic diastolic (congestive) heart failure: Secondary | ICD-10-CM | POA: Diagnosis not present

## 2022-07-26 DIAGNOSIS — I442 Atrioventricular block, complete: Secondary | ICD-10-CM | POA: Diagnosis not present

## 2022-07-27 DIAGNOSIS — I890 Lymphedema, not elsewhere classified: Secondary | ICD-10-CM | POA: Diagnosis not present

## 2022-07-27 DIAGNOSIS — I4510 Unspecified right bundle-branch block: Secondary | ICD-10-CM | POA: Diagnosis not present

## 2022-07-27 DIAGNOSIS — I442 Atrioventricular block, complete: Secondary | ICD-10-CM | POA: Diagnosis not present

## 2022-07-27 DIAGNOSIS — J9611 Chronic respiratory failure with hypoxia: Secondary | ICD-10-CM | POA: Diagnosis not present

## 2022-07-27 DIAGNOSIS — M1990 Unspecified osteoarthritis, unspecified site: Secondary | ICD-10-CM | POA: Diagnosis not present

## 2022-07-27 DIAGNOSIS — G629 Polyneuropathy, unspecified: Secondary | ICD-10-CM | POA: Diagnosis not present

## 2022-07-27 DIAGNOSIS — I110 Hypertensive heart disease with heart failure: Secondary | ICD-10-CM | POA: Diagnosis not present

## 2022-07-27 DIAGNOSIS — I872 Venous insufficiency (chronic) (peripheral): Secondary | ICD-10-CM | POA: Diagnosis not present

## 2022-07-27 DIAGNOSIS — I5032 Chronic diastolic (congestive) heart failure: Secondary | ICD-10-CM | POA: Diagnosis not present

## 2022-07-31 DIAGNOSIS — J9611 Chronic respiratory failure with hypoxia: Secondary | ICD-10-CM | POA: Diagnosis not present

## 2022-07-31 DIAGNOSIS — I5032 Chronic diastolic (congestive) heart failure: Secondary | ICD-10-CM | POA: Diagnosis not present

## 2022-07-31 DIAGNOSIS — I872 Venous insufficiency (chronic) (peripheral): Secondary | ICD-10-CM | POA: Diagnosis not present

## 2022-07-31 DIAGNOSIS — I110 Hypertensive heart disease with heart failure: Secondary | ICD-10-CM | POA: Diagnosis not present

## 2022-07-31 DIAGNOSIS — I890 Lymphedema, not elsewhere classified: Secondary | ICD-10-CM | POA: Diagnosis not present

## 2022-07-31 DIAGNOSIS — I442 Atrioventricular block, complete: Secondary | ICD-10-CM | POA: Diagnosis not present

## 2022-07-31 DIAGNOSIS — I4510 Unspecified right bundle-branch block: Secondary | ICD-10-CM | POA: Diagnosis not present

## 2022-07-31 DIAGNOSIS — G629 Polyneuropathy, unspecified: Secondary | ICD-10-CM | POA: Diagnosis not present

## 2022-07-31 DIAGNOSIS — M1990 Unspecified osteoarthritis, unspecified site: Secondary | ICD-10-CM | POA: Diagnosis not present

## 2022-08-01 ENCOUNTER — Ambulatory Visit (INDEPENDENT_AMBULATORY_CARE_PROVIDER_SITE_OTHER): Payer: Medicare PPO | Admitting: Pulmonary Disease

## 2022-08-01 ENCOUNTER — Encounter (HOSPITAL_BASED_OUTPATIENT_CLINIC_OR_DEPARTMENT_OTHER): Payer: Self-pay | Admitting: Pulmonary Disease

## 2022-08-01 VITALS — BP 116/70 | HR 82 | Temp 98.2°F | Ht 66.0 in | Wt 256.0 lb

## 2022-08-01 DIAGNOSIS — I5032 Chronic diastolic (congestive) heart failure: Secondary | ICD-10-CM | POA: Diagnosis not present

## 2022-08-01 DIAGNOSIS — I890 Lymphedema, not elsewhere classified: Secondary | ICD-10-CM | POA: Diagnosis not present

## 2022-08-01 DIAGNOSIS — I4510 Unspecified right bundle-branch block: Secondary | ICD-10-CM | POA: Diagnosis not present

## 2022-08-01 DIAGNOSIS — J9612 Chronic respiratory failure with hypercapnia: Secondary | ICD-10-CM

## 2022-08-01 DIAGNOSIS — G4733 Obstructive sleep apnea (adult) (pediatric): Secondary | ICD-10-CM

## 2022-08-01 DIAGNOSIS — G629 Polyneuropathy, unspecified: Secondary | ICD-10-CM | POA: Diagnosis not present

## 2022-08-01 DIAGNOSIS — J9611 Chronic respiratory failure with hypoxia: Secondary | ICD-10-CM | POA: Diagnosis not present

## 2022-08-01 DIAGNOSIS — G4730 Sleep apnea, unspecified: Secondary | ICD-10-CM | POA: Diagnosis not present

## 2022-08-01 DIAGNOSIS — I110 Hypertensive heart disease with heart failure: Secondary | ICD-10-CM | POA: Diagnosis not present

## 2022-08-01 DIAGNOSIS — I872 Venous insufficiency (chronic) (peripheral): Secondary | ICD-10-CM | POA: Diagnosis not present

## 2022-08-01 DIAGNOSIS — I442 Atrioventricular block, complete: Secondary | ICD-10-CM | POA: Diagnosis not present

## 2022-08-01 DIAGNOSIS — M1990 Unspecified osteoarthritis, unspecified site: Secondary | ICD-10-CM | POA: Diagnosis not present

## 2022-08-01 NOTE — Assessment & Plan Note (Signed)
Cause unclear likely related to obesity hypoventilation and fluid overload.  Will obtain PFTs to make sure there is no evidence of airway obstruction. Meantime I have asked her to continue oxygen 24/7 including blending into PAP machine.

## 2022-08-01 NOTE — Patient Instructions (Addendum)
  X CPAP titration study @ [facility]  X schedule PFTs  OK to start using CPAP for now with O2 blended in

## 2022-08-01 NOTE — Progress Notes (Signed)
Subjective:    Patient ID: Dawn Gonzales, female    DOB: [birth year], 82 y.o.   MRN: [id number]  HPI 82 year old woman presents to establish care for chronic hypoxic and hypercarbic respiratory failure. She has been maintained on 3 L of oxygen She was hospitalized 05/2022 for recurrent falls and hypoxia, required transient BiPAP, discharged to rehab on 4 L. Prior to this she was hospitalized in December for CHF exacerbation ABG was 7.34/84/61/91% on 32% FiO2 Echo 10/2021 showed normal LVEF with grade 1 diastolic dysfunction.  She is maintained on 40 mg torsemide in the morning and 20 mg in the afternoon She was initially discharged to a rehab program in December daughter but she remained in a wheelchair and daughter reports they were not pleased with the rehab care he got.  [facility]. Daughter obtained an AirSense auto CPAP on her own but patient has not used this yet   PMH -TAVR,  complete heart block,  diastolic CHF,  hypertension  Venous stasis disease  Epworth sleepiness score is 5. Bedtime is around 8 PM, she sleeps in a recliner/lift chair, on her back with 1 pillow. It takes her a few minutes to fall asleep, denies nocturnal awakenings and is out of bed at 7 AM feeling tired with dryness of mouth without headaches. She has lost 30 pounds over the last 3 months. There is no history suggestive of cataplexy, sleep paralysis or parasomnias  Reviewed hospital discharge summary, cardiology consultation   Significant tests/ events reviewed  05/2018 NPSG - AHI 12/h, low sat 83%, 164 mins with sat<88%  Past Medical History:  Diagnosis Date   Anemia    Anxiety    Arthritis    Bilateral renal cysts    Cholecystitis    Chronic diastolic heart failure    Depression    Essential hypertension    History of cellulitis    History of CVA (cerebrovascular accident)    History of endocarditis    Hyperlipemia     Morbid obesity    Neuropathy    Right bundle branch block (RBBB)    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   26 mm Edwards Sapien 3 transcatheter heart valve placed via percutaneous right transfemoral approach    Severe aortic stenosis    Sleep apnea    Past Surgical History:  Procedure Laterality Date   Abdominal gangrene     ADENOIDECTOMY     Cataract surgery     COLONOSCOPY     ENDOSCOPIC RETROGRADE CHOLANGIOPANCREATOGRAPHY (ERCP) WITH PROPOFOL N/A 02/08/2018   Procedure: ENDOSCOPIC RETROGRADE CHOLANGIOPANCREATOGRAPHY (ERCP) WITH PROPOFOL;  Surgeon: Irving Copas., MD;  Location: [facility] END[facility]COPY;  Service: Gastroenterology;  Laterality: N/A;   EUS  02/08/2018   Procedure: UPPER ENDOSCOPIC ULTRASOUND (EUS) LINEAR;  Surgeon: Rush Landmark Telford Nab., MD;  Location: [place];  Service: Gastroenterology;;   EYE SURGERY     IR FLUORO RM 30-60 MIN  03/27/2018   IR PERC CHOLECYSTOSTOMY  02/09/2018   IR RADIOLOGIST EVAL & MGMT  03/26/2018   IR RADIOLOGY PERIPHERAL GUIDED IV START  05/22/2018   IR RADIOLOGY PERIPHERAL GUIDED IV START  05/30/2018   IR RADIOLOGY PERIPHERAL GUIDED IV START  06/05/2018   IR US GUIDE VASC ACCESS RIGHT  05/22/2018   IR US GUIDE VASC ACCESS RIGHT  05/30/2018   IR US GUIDE VASC ACCESS RIGHT  06/05/2018   PACEMAKER IMPLANT N/A 07/10/2018   Procedure: PACEMAKER IMPLANT;  Surgeon: Evans Lance, MD;  Location: [place] [facility];  Service: Cardiovascular;  Laterality: N/A;   REMOVAL OF STONES  02/08/2018   Procedure: REMOVAL OF STONES;  Surgeon: Rush Landmark Telford Nab., MD;  Location: [place];  Service: Gastroenterology;;   Joan Mayans  02/08/2018   Procedure: Joan Mayans;  Surgeon: Mansouraty, Telford Nab., MD;  Location: [place];  Service: Gastroenterology;;   TONSILLECTOMY     TOOTH EXTRACTION N/A 06/18/2018   Procedure: DENTAL RESTORATION/EXTRACTIONS;  Surgeon: Diona Browner, DDS;  Location: [place];  Service: Oral Surgery;   Laterality: N/A;   TRANSCATHETER AORTIC VALVE REPLACEMENT, TRANSFEMORAL N/A 07/09/2018   Procedure: TRANSCATHETER AORTIC VALVE REPLACEMENT, TRANSFEMORAL;  Surgeon: Sherren Mocha, MD;  Location: [place] [facility];  Service: Cardiovascular;  Laterality: N/A;   Tummy tuck      No Known Allergies  Social History   Socioeconomic History   Marital status: Married    Spouse name: Not on file   Number of children: 3   Years of education: Not on file   Highest education level: Not on file  Occupational History   Occupation: Retired houswife  Tobacco Use   Smoking status: Former    Types: Cigarettes    Quit date: 05/23/1978    Years since quitting: 44.2    Passive exposure: Never   Smokeless tobacco: Never  Vaping Use   Vaping Use: Never used  Substance and Sexual Activity   Alcohol use: Not Currently    Comment: Occasional   Drug use: Never   Sexual activity: Not on file  Other Topics Concern   Not on file  Social History Narrative   Not on file   Social Determinants of Health   Financial Resource Strain: Not on file  Food Insecurity: No Food Insecurity (05/09/2022)   Hunger Vital Sign    Worried About Running Out of Food in the Last Year: Never true    [place] in the Last Year: Never true  Transportation Needs: No Transportation Needs (05/09/2022)   PRAPARE - Hydrologist (Medical): No    Lack of Transportation (Non-Medical): No  Physical Activity: Not on file  Stress: Not on file  Social Connections: Not on file  Intimate Partner Violence: Not At Risk (05/09/2022)   Humiliation, Afraid, Rape, and Kick questionnaire    Fear of Current or Ex-Partner: No    Emotionally Abused: No    Physically Abused: No    Sexually Abused: No    Family History  Problem Relation Age of Onset   Cancer Mother    Hypertension Father    Arthritis Father    Heart attack Father      Review of Systems Arrives in a wheelchair Shortness of breath with  activity Nonproductive cough Weight loss Sneezing Anxiety and depression Feet swelling Joint stiffness     Objective:   Physical Exam  Gen. Pleasant, obese, in no distress, normal affect ENT - no pallor,icterus, no post nasal drip, class 2-3 airway Neck: No JVD, no thyromegaly, no carotid bruits Lungs: no use of accessory muscles, no dullness to percussion, decreased without rales or rhonchi  Cardiovascular: Rhythm regular, heart sounds  normal, no murmurs or gallops, 1+ peripheral edema Abdomen: soft and non-tender, no hepatosplenomegaly, BS normal. Musculoskeletal: No deformities, no cyanosis or clubbing, chronic stasis changes BLE Neuro:  alert, non focal, no tremors       Assessment & Plan:

## 2022-08-01 NOTE — Assessment & Plan Note (Signed)
Mild OSA was noted on NPSG in 2020 , but hypoxia was severe.  She is now on oxygen.  She would benefit from a formal titration study.  Bilevel may be required due to hypoventilation and she will likely require oxygen blended into her PAP Meanwhile daughter has obtained a CPAP machine which appears to be an AirSense 10 with auto settings, I have asked her to start using this every night with oxygen blended until we clarify settings we will then ask adapt to adjust settings on her machine  Weight loss encouraged, compliance with goal of at least 4-6 hrs every night is the expectation. Advised against medications with sedative side effects Cautioned against driving when sleepy - understanding that sleepiness will vary on a day to day basis

## 2022-08-01 NOTE — Assessment & Plan Note (Signed)
On torsemide 20 mg daily and appears to be euvolemic without significant pedal edema

## 2022-08-03 DIAGNOSIS — I4510 Unspecified right bundle-branch block: Secondary | ICD-10-CM | POA: Diagnosis not present

## 2022-08-03 DIAGNOSIS — G629 Polyneuropathy, unspecified: Secondary | ICD-10-CM | POA: Diagnosis not present

## 2022-08-03 DIAGNOSIS — M1990 Unspecified osteoarthritis, unspecified site: Secondary | ICD-10-CM | POA: Diagnosis not present

## 2022-08-03 DIAGNOSIS — I5032 Chronic diastolic (congestive) heart failure: Secondary | ICD-10-CM | POA: Diagnosis not present

## 2022-08-03 DIAGNOSIS — I442 Atrioventricular block, complete: Secondary | ICD-10-CM | POA: Diagnosis not present

## 2022-08-03 DIAGNOSIS — J9611 Chronic respiratory failure with hypoxia: Secondary | ICD-10-CM | POA: Diagnosis not present

## 2022-08-03 DIAGNOSIS — I890 Lymphedema, not elsewhere classified: Secondary | ICD-10-CM | POA: Diagnosis not present

## 2022-08-03 DIAGNOSIS — I872 Venous insufficiency (chronic) (peripheral): Secondary | ICD-10-CM | POA: Diagnosis not present

## 2022-08-03 DIAGNOSIS — I110 Hypertensive heart disease with heart failure: Secondary | ICD-10-CM | POA: Diagnosis not present

## 2022-08-04 DIAGNOSIS — G44009 Cluster headache syndrome, unspecified, not intractable: Secondary | ICD-10-CM | POA: Diagnosis not present

## 2022-08-07 DIAGNOSIS — Z9181 History of falling: Secondary | ICD-10-CM | POA: Diagnosis not present

## 2022-08-07 DIAGNOSIS — I509 Heart failure, unspecified: Secondary | ICD-10-CM | POA: Diagnosis not present

## 2022-08-07 DIAGNOSIS — J9611 Chronic respiratory failure with hypoxia: Secondary | ICD-10-CM | POA: Diagnosis not present

## 2022-08-07 DIAGNOSIS — G629 Polyneuropathy, unspecified: Secondary | ICD-10-CM | POA: Diagnosis not present

## 2022-08-07 DIAGNOSIS — I110 Hypertensive heart disease with heart failure: Secondary | ICD-10-CM | POA: Diagnosis not present

## 2022-08-07 DIAGNOSIS — I5032 Chronic diastolic (congestive) heart failure: Secondary | ICD-10-CM | POA: Diagnosis not present

## 2022-08-07 DIAGNOSIS — I890 Lymphedema, not elsewhere classified: Secondary | ICD-10-CM | POA: Diagnosis not present

## 2022-08-07 DIAGNOSIS — M1990 Unspecified osteoarthritis, unspecified site: Secondary | ICD-10-CM | POA: Diagnosis not present

## 2022-08-08 DIAGNOSIS — I872 Venous insufficiency (chronic) (peripheral): Secondary | ICD-10-CM | POA: Diagnosis not present

## 2022-08-08 DIAGNOSIS — G629 Polyneuropathy, unspecified: Secondary | ICD-10-CM | POA: Diagnosis not present

## 2022-08-08 DIAGNOSIS — M1990 Unspecified osteoarthritis, unspecified site: Secondary | ICD-10-CM | POA: Diagnosis not present

## 2022-08-08 DIAGNOSIS — J9611 Chronic respiratory failure with hypoxia: Secondary | ICD-10-CM | POA: Diagnosis not present

## 2022-08-08 DIAGNOSIS — I110 Hypertensive heart disease with heart failure: Secondary | ICD-10-CM | POA: Diagnosis not present

## 2022-08-08 DIAGNOSIS — I890 Lymphedema, not elsewhere classified: Secondary | ICD-10-CM | POA: Diagnosis not present

## 2022-08-08 DIAGNOSIS — I5032 Chronic diastolic (congestive) heart failure: Secondary | ICD-10-CM | POA: Diagnosis not present

## 2022-08-08 DIAGNOSIS — I442 Atrioventricular block, complete: Secondary | ICD-10-CM | POA: Diagnosis not present

## 2022-08-08 DIAGNOSIS — I4510 Unspecified right bundle-branch block: Secondary | ICD-10-CM | POA: Diagnosis not present

## 2022-08-09 DIAGNOSIS — I442 Atrioventricular block, complete: Secondary | ICD-10-CM | POA: Diagnosis not present

## 2022-08-09 DIAGNOSIS — I110 Hypertensive heart disease with heart failure: Secondary | ICD-10-CM | POA: Diagnosis not present

## 2022-08-09 DIAGNOSIS — J9611 Chronic respiratory failure with hypoxia: Secondary | ICD-10-CM | POA: Diagnosis not present

## 2022-08-09 DIAGNOSIS — I872 Venous insufficiency (chronic) (peripheral): Secondary | ICD-10-CM | POA: Diagnosis not present

## 2022-08-09 DIAGNOSIS — M1990 Unspecified osteoarthritis, unspecified site: Secondary | ICD-10-CM | POA: Diagnosis not present

## 2022-08-09 DIAGNOSIS — G629 Polyneuropathy, unspecified: Secondary | ICD-10-CM | POA: Diagnosis not present

## 2022-08-09 DIAGNOSIS — I890 Lymphedema, not elsewhere classified: Secondary | ICD-10-CM | POA: Diagnosis not present

## 2022-08-09 DIAGNOSIS — I5032 Chronic diastolic (congestive) heart failure: Secondary | ICD-10-CM | POA: Diagnosis not present

## 2022-08-09 DIAGNOSIS — I4510 Unspecified right bundle-branch block: Secondary | ICD-10-CM | POA: Diagnosis not present

## 2022-08-10 DIAGNOSIS — I110 Hypertensive heart disease with heart failure: Secondary | ICD-10-CM | POA: Diagnosis not present

## 2022-08-10 DIAGNOSIS — J9611 Chronic respiratory failure with hypoxia: Secondary | ICD-10-CM | POA: Diagnosis not present

## 2022-08-10 DIAGNOSIS — I5032 Chronic diastolic (congestive) heart failure: Secondary | ICD-10-CM | POA: Diagnosis not present

## 2022-08-10 DIAGNOSIS — M1990 Unspecified osteoarthritis, unspecified site: Secondary | ICD-10-CM | POA: Diagnosis not present

## 2022-08-10 DIAGNOSIS — I890 Lymphedema, not elsewhere classified: Secondary | ICD-10-CM | POA: Diagnosis not present

## 2022-08-10 DIAGNOSIS — I442 Atrioventricular block, complete: Secondary | ICD-10-CM | POA: Diagnosis not present

## 2022-08-10 DIAGNOSIS — G629 Polyneuropathy, unspecified: Secondary | ICD-10-CM | POA: Diagnosis not present

## 2022-08-10 DIAGNOSIS — I4510 Unspecified right bundle-branch block: Secondary | ICD-10-CM | POA: Diagnosis not present

## 2022-08-10 DIAGNOSIS — I872 Venous insufficiency (chronic) (peripheral): Secondary | ICD-10-CM | POA: Diagnosis not present

## 2022-08-15 DIAGNOSIS — G629 Polyneuropathy, unspecified: Secondary | ICD-10-CM | POA: Diagnosis not present

## 2022-08-15 DIAGNOSIS — I890 Lymphedema, not elsewhere classified: Secondary | ICD-10-CM | POA: Diagnosis not present

## 2022-08-15 DIAGNOSIS — I872 Venous insufficiency (chronic) (peripheral): Secondary | ICD-10-CM | POA: Diagnosis not present

## 2022-08-15 DIAGNOSIS — I4510 Unspecified right bundle-branch block: Secondary | ICD-10-CM | POA: Diagnosis not present

## 2022-08-15 DIAGNOSIS — J9611 Chronic respiratory failure with hypoxia: Secondary | ICD-10-CM | POA: Diagnosis not present

## 2022-08-15 DIAGNOSIS — I442 Atrioventricular block, complete: Secondary | ICD-10-CM | POA: Diagnosis not present

## 2022-08-15 DIAGNOSIS — M1990 Unspecified osteoarthritis, unspecified site: Secondary | ICD-10-CM | POA: Diagnosis not present

## 2022-08-15 DIAGNOSIS — I110 Hypertensive heart disease with heart failure: Secondary | ICD-10-CM | POA: Diagnosis not present

## 2022-08-15 DIAGNOSIS — I5032 Chronic diastolic (congestive) heart failure: Secondary | ICD-10-CM | POA: Diagnosis not present

## 2022-08-16 DIAGNOSIS — I4510 Unspecified right bundle-branch block: Secondary | ICD-10-CM | POA: Diagnosis not present

## 2022-08-16 DIAGNOSIS — I5032 Chronic diastolic (congestive) heart failure: Secondary | ICD-10-CM | POA: Diagnosis not present

## 2022-08-16 DIAGNOSIS — M1990 Unspecified osteoarthritis, unspecified site: Secondary | ICD-10-CM | POA: Diagnosis not present

## 2022-08-16 DIAGNOSIS — I442 Atrioventricular block, complete: Secondary | ICD-10-CM | POA: Diagnosis not present

## 2022-08-16 DIAGNOSIS — J9611 Chronic respiratory failure with hypoxia: Secondary | ICD-10-CM | POA: Diagnosis not present

## 2022-08-16 DIAGNOSIS — G629 Polyneuropathy, unspecified: Secondary | ICD-10-CM | POA: Diagnosis not present

## 2022-08-16 DIAGNOSIS — I872 Venous insufficiency (chronic) (peripheral): Secondary | ICD-10-CM | POA: Diagnosis not present

## 2022-08-16 DIAGNOSIS — I110 Hypertensive heart disease with heart failure: Secondary | ICD-10-CM | POA: Diagnosis not present

## 2022-08-16 DIAGNOSIS — I890 Lymphedema, not elsewhere classified: Secondary | ICD-10-CM | POA: Diagnosis not present

## 2022-08-21 DIAGNOSIS — I110 Hypertensive heart disease with heart failure: Secondary | ICD-10-CM | POA: Diagnosis not present

## 2022-08-21 DIAGNOSIS — I442 Atrioventricular block, complete: Secondary | ICD-10-CM | POA: Diagnosis not present

## 2022-08-21 DIAGNOSIS — M1990 Unspecified osteoarthritis, unspecified site: Secondary | ICD-10-CM | POA: Diagnosis not present

## 2022-08-21 DIAGNOSIS — J9611 Chronic respiratory failure with hypoxia: Secondary | ICD-10-CM | POA: Diagnosis not present

## 2022-08-21 DIAGNOSIS — I890 Lymphedema, not elsewhere classified: Secondary | ICD-10-CM | POA: Diagnosis not present

## 2022-08-21 DIAGNOSIS — I5032 Chronic diastolic (congestive) heart failure: Secondary | ICD-10-CM | POA: Diagnosis not present

## 2022-08-21 DIAGNOSIS — I872 Venous insufficiency (chronic) (peripheral): Secondary | ICD-10-CM | POA: Diagnosis not present

## 2022-08-21 DIAGNOSIS — G629 Polyneuropathy, unspecified: Secondary | ICD-10-CM | POA: Diagnosis not present

## 2022-08-21 DIAGNOSIS — I4510 Unspecified right bundle-branch block: Secondary | ICD-10-CM | POA: Diagnosis not present

## 2022-08-23 NOTE — Progress Notes (Signed)
Remote pacemaker transmission.   

## 2022-08-25 DIAGNOSIS — G629 Polyneuropathy, unspecified: Secondary | ICD-10-CM | POA: Diagnosis not present

## 2022-08-25 DIAGNOSIS — I5032 Chronic diastolic (congestive) heart failure: Secondary | ICD-10-CM | POA: Diagnosis not present

## 2022-08-25 DIAGNOSIS — I890 Lymphedema, not elsewhere classified: Secondary | ICD-10-CM | POA: Diagnosis not present

## 2022-08-25 DIAGNOSIS — I872 Venous insufficiency (chronic) (peripheral): Secondary | ICD-10-CM | POA: Diagnosis not present

## 2022-08-25 DIAGNOSIS — J9611 Chronic respiratory failure with hypoxia: Secondary | ICD-10-CM | POA: Diagnosis not present

## 2022-08-25 DIAGNOSIS — I110 Hypertensive heart disease with heart failure: Secondary | ICD-10-CM | POA: Diagnosis not present

## 2022-08-25 DIAGNOSIS — M1990 Unspecified osteoarthritis, unspecified site: Secondary | ICD-10-CM | POA: Diagnosis not present

## 2022-08-25 DIAGNOSIS — I4510 Unspecified right bundle-branch block: Secondary | ICD-10-CM | POA: Diagnosis not present

## 2022-08-25 DIAGNOSIS — I442 Atrioventricular block, complete: Secondary | ICD-10-CM | POA: Diagnosis not present

## 2022-08-29 DIAGNOSIS — J9611 Chronic respiratory failure with hypoxia: Secondary | ICD-10-CM | POA: Diagnosis not present

## 2022-08-29 DIAGNOSIS — I872 Venous insufficiency (chronic) (peripheral): Secondary | ICD-10-CM | POA: Diagnosis not present

## 2022-08-29 DIAGNOSIS — I4510 Unspecified right bundle-branch block: Secondary | ICD-10-CM | POA: Diagnosis not present

## 2022-08-29 DIAGNOSIS — I890 Lymphedema, not elsewhere classified: Secondary | ICD-10-CM | POA: Diagnosis not present

## 2022-08-29 DIAGNOSIS — I110 Hypertensive heart disease with heart failure: Secondary | ICD-10-CM | POA: Diagnosis not present

## 2022-08-29 DIAGNOSIS — I442 Atrioventricular block, complete: Secondary | ICD-10-CM | POA: Diagnosis not present

## 2022-08-29 DIAGNOSIS — G629 Polyneuropathy, unspecified: Secondary | ICD-10-CM | POA: Diagnosis not present

## 2022-08-29 DIAGNOSIS — I5032 Chronic diastolic (congestive) heart failure: Secondary | ICD-10-CM | POA: Diagnosis not present

## 2022-08-29 DIAGNOSIS — M1990 Unspecified osteoarthritis, unspecified site: Secondary | ICD-10-CM | POA: Diagnosis not present

## 2022-09-03 DIAGNOSIS — G44009 Cluster headache syndrome, unspecified, not intractable: Secondary | ICD-10-CM | POA: Diagnosis not present

## 2022-09-05 DIAGNOSIS — I110 Hypertensive heart disease with heart failure: Secondary | ICD-10-CM | POA: Diagnosis not present

## 2022-09-05 DIAGNOSIS — M1990 Unspecified osteoarthritis, unspecified site: Secondary | ICD-10-CM | POA: Diagnosis not present

## 2022-09-05 DIAGNOSIS — I872 Venous insufficiency (chronic) (peripheral): Secondary | ICD-10-CM | POA: Diagnosis not present

## 2022-09-05 DIAGNOSIS — I442 Atrioventricular block, complete: Secondary | ICD-10-CM | POA: Diagnosis not present

## 2022-09-05 DIAGNOSIS — I890 Lymphedema, not elsewhere classified: Secondary | ICD-10-CM | POA: Diagnosis not present

## 2022-09-05 DIAGNOSIS — J9611 Chronic respiratory failure with hypoxia: Secondary | ICD-10-CM | POA: Diagnosis not present

## 2022-09-05 DIAGNOSIS — I4510 Unspecified right bundle-branch block: Secondary | ICD-10-CM | POA: Diagnosis not present

## 2022-09-05 DIAGNOSIS — I5032 Chronic diastolic (congestive) heart failure: Secondary | ICD-10-CM | POA: Diagnosis not present

## 2022-09-05 DIAGNOSIS — G629 Polyneuropathy, unspecified: Secondary | ICD-10-CM | POA: Diagnosis not present

## 2022-09-06 DIAGNOSIS — I442 Atrioventricular block, complete: Secondary | ICD-10-CM | POA: Diagnosis not present

## 2022-09-06 DIAGNOSIS — I509 Heart failure, unspecified: Secondary | ICD-10-CM | POA: Diagnosis not present

## 2022-09-06 DIAGNOSIS — I890 Lymphedema, not elsewhere classified: Secondary | ICD-10-CM | POA: Diagnosis not present

## 2022-09-06 DIAGNOSIS — G629 Polyneuropathy, unspecified: Secondary | ICD-10-CM | POA: Diagnosis not present

## 2022-09-06 DIAGNOSIS — I5032 Chronic diastolic (congestive) heart failure: Secondary | ICD-10-CM | POA: Diagnosis not present

## 2022-09-06 DIAGNOSIS — Z9181 History of falling: Secondary | ICD-10-CM | POA: Diagnosis not present

## 2022-09-06 DIAGNOSIS — I872 Venous insufficiency (chronic) (peripheral): Secondary | ICD-10-CM | POA: Diagnosis not present

## 2022-09-06 DIAGNOSIS — M1990 Unspecified osteoarthritis, unspecified site: Secondary | ICD-10-CM | POA: Diagnosis not present

## 2022-09-06 DIAGNOSIS — J9611 Chronic respiratory failure with hypoxia: Secondary | ICD-10-CM | POA: Diagnosis not present

## 2022-09-06 DIAGNOSIS — I4510 Unspecified right bundle-branch block: Secondary | ICD-10-CM | POA: Diagnosis not present

## 2022-09-06 DIAGNOSIS — I110 Hypertensive heart disease with heart failure: Secondary | ICD-10-CM | POA: Diagnosis not present

## 2022-09-08 ENCOUNTER — Encounter (HOSPITAL_BASED_OUTPATIENT_CLINIC_OR_DEPARTMENT_OTHER): Payer: Medicare PPO | Admitting: Pulmonary Disease

## 2022-09-12 DIAGNOSIS — I4510 Unspecified right bundle-branch block: Secondary | ICD-10-CM | POA: Diagnosis not present

## 2022-09-12 DIAGNOSIS — I872 Venous insufficiency (chronic) (peripheral): Secondary | ICD-10-CM | POA: Diagnosis not present

## 2022-09-12 DIAGNOSIS — I442 Atrioventricular block, complete: Secondary | ICD-10-CM | POA: Diagnosis not present

## 2022-09-12 DIAGNOSIS — I890 Lymphedema, not elsewhere classified: Secondary | ICD-10-CM | POA: Diagnosis not present

## 2022-09-12 DIAGNOSIS — I5032 Chronic diastolic (congestive) heart failure: Secondary | ICD-10-CM | POA: Diagnosis not present

## 2022-09-12 DIAGNOSIS — J9611 Chronic respiratory failure with hypoxia: Secondary | ICD-10-CM | POA: Diagnosis not present

## 2022-09-12 DIAGNOSIS — G629 Polyneuropathy, unspecified: Secondary | ICD-10-CM | POA: Diagnosis not present

## 2022-09-12 DIAGNOSIS — M1990 Unspecified osteoarthritis, unspecified site: Secondary | ICD-10-CM | POA: Diagnosis not present

## 2022-09-12 DIAGNOSIS — I110 Hypertensive heart disease with heart failure: Secondary | ICD-10-CM | POA: Diagnosis not present

## 2022-09-14 DIAGNOSIS — N2889 Other specified disorders of kidney and ureter: Secondary | ICD-10-CM | POA: Insufficient documentation

## 2022-09-14 DIAGNOSIS — G629 Polyneuropathy, unspecified: Secondary | ICD-10-CM | POA: Diagnosis not present

## 2022-09-14 DIAGNOSIS — Z9981 Dependence on supplemental oxygen: Secondary | ICD-10-CM | POA: Diagnosis not present

## 2022-09-14 DIAGNOSIS — I700 Atherosclerosis of aorta: Secondary | ICD-10-CM | POA: Insufficient documentation

## 2022-09-14 DIAGNOSIS — F32A Depression, unspecified: Secondary | ICD-10-CM | POA: Diagnosis not present

## 2022-09-14 DIAGNOSIS — E782 Mixed hyperlipidemia: Secondary | ICD-10-CM | POA: Diagnosis not present

## 2022-09-14 DIAGNOSIS — I872 Venous insufficiency (chronic) (peripheral): Secondary | ICD-10-CM | POA: Diagnosis not present

## 2022-09-14 DIAGNOSIS — I2510 Atherosclerotic heart disease of native coronary artery without angina pectoris: Secondary | ICD-10-CM | POA: Insufficient documentation

## 2022-09-14 DIAGNOSIS — N1831 Chronic kidney disease, stage 3a: Secondary | ICD-10-CM | POA: Insufficient documentation

## 2022-09-14 DIAGNOSIS — R600 Localized edema: Secondary | ICD-10-CM | POA: Diagnosis not present

## 2022-09-14 DIAGNOSIS — F329 Major depressive disorder, single episode, unspecified: Secondary | ICD-10-CM | POA: Diagnosis not present

## 2022-09-14 DIAGNOSIS — I712 Thoracic aortic aneurysm, without rupture, unspecified: Secondary | ICD-10-CM | POA: Insufficient documentation

## 2022-09-14 DIAGNOSIS — M170 Bilateral primary osteoarthritis of knee: Secondary | ICD-10-CM | POA: Insufficient documentation

## 2022-09-18 ENCOUNTER — Other Ambulatory Visit (HOSPITAL_COMMUNITY): Payer: Self-pay | Admitting: Family Medicine

## 2022-09-18 DIAGNOSIS — I110 Hypertensive heart disease with heart failure: Secondary | ICD-10-CM | POA: Diagnosis not present

## 2022-09-18 DIAGNOSIS — I890 Lymphedema, not elsewhere classified: Secondary | ICD-10-CM | POA: Diagnosis not present

## 2022-09-18 DIAGNOSIS — G629 Polyneuropathy, unspecified: Secondary | ICD-10-CM | POA: Diagnosis not present

## 2022-09-18 DIAGNOSIS — I872 Venous insufficiency (chronic) (peripheral): Secondary | ICD-10-CM | POA: Diagnosis not present

## 2022-09-18 DIAGNOSIS — I442 Atrioventricular block, complete: Secondary | ICD-10-CM | POA: Diagnosis not present

## 2022-09-18 DIAGNOSIS — I4510 Unspecified right bundle-branch block: Secondary | ICD-10-CM | POA: Diagnosis not present

## 2022-09-18 DIAGNOSIS — I5032 Chronic diastolic (congestive) heart failure: Secondary | ICD-10-CM | POA: Diagnosis not present

## 2022-09-18 DIAGNOSIS — N2889 Other specified disorders of kidney and ureter: Secondary | ICD-10-CM

## 2022-09-18 DIAGNOSIS — J9611 Chronic respiratory failure with hypoxia: Secondary | ICD-10-CM | POA: Diagnosis not present

## 2022-09-18 DIAGNOSIS — M1990 Unspecified osteoarthritis, unspecified site: Secondary | ICD-10-CM | POA: Diagnosis not present

## 2022-09-22 ENCOUNTER — Ambulatory Visit (HOSPITAL_BASED_OUTPATIENT_CLINIC_OR_DEPARTMENT_OTHER): Payer: Medicare PPO | Attending: Pulmonary Disease | Admitting: Pulmonary Disease

## 2022-09-22 VITALS — Ht 66.0 in | Wt 255.0 lb

## 2022-09-22 DIAGNOSIS — G4733 Obstructive sleep apnea (adult) (pediatric): Secondary | ICD-10-CM | POA: Insufficient documentation

## 2022-09-22 DIAGNOSIS — G4730 Sleep apnea, unspecified: Secondary | ICD-10-CM

## 2022-10-03 DIAGNOSIS — I4510 Unspecified right bundle-branch block: Secondary | ICD-10-CM | POA: Diagnosis not present

## 2022-10-03 DIAGNOSIS — I110 Hypertensive heart disease with heart failure: Secondary | ICD-10-CM | POA: Diagnosis not present

## 2022-10-03 DIAGNOSIS — J9611 Chronic respiratory failure with hypoxia: Secondary | ICD-10-CM | POA: Diagnosis not present

## 2022-10-03 DIAGNOSIS — M1990 Unspecified osteoarthritis, unspecified site: Secondary | ICD-10-CM | POA: Diagnosis not present

## 2022-10-03 DIAGNOSIS — I5032 Chronic diastolic (congestive) heart failure: Secondary | ICD-10-CM | POA: Diagnosis not present

## 2022-10-03 DIAGNOSIS — I890 Lymphedema, not elsewhere classified: Secondary | ICD-10-CM | POA: Diagnosis not present

## 2022-10-03 DIAGNOSIS — I872 Venous insufficiency (chronic) (peripheral): Secondary | ICD-10-CM | POA: Diagnosis not present

## 2022-10-03 DIAGNOSIS — G629 Polyneuropathy, unspecified: Secondary | ICD-10-CM | POA: Diagnosis not present

## 2022-10-03 DIAGNOSIS — I442 Atrioventricular block, complete: Secondary | ICD-10-CM | POA: Diagnosis not present

## 2022-10-04 DIAGNOSIS — G4733 Obstructive sleep apnea (adult) (pediatric): Secondary | ICD-10-CM | POA: Diagnosis not present

## 2022-10-04 DIAGNOSIS — G44009 Cluster headache syndrome, unspecified, not intractable: Secondary | ICD-10-CM | POA: Diagnosis not present

## 2022-10-04 NOTE — Telephone Encounter (Signed)
She required BiPAP during titration study. Please send prescription for auto BiPAP EPAP minimum 12, pressure support +4 cm, IPAP maximum 20 Small size fullface mask to DME  Office visit in 8 weeks

## 2022-10-04 NOTE — Procedures (Signed)
Patient Name: Dawn Gonzales, Yusupov Date: 09/22/2022 Gender: Female D.O.B: Apr 15, 1941 Age (years): 33 Referring Provider: Cyril Mourning MD, ABSM Height (inches): 66 Interpreting Physician: Cyril Mourning MD, ABSM Weight (lbs): 275 RPSGT: Rolene Arbour BMI: 44 MRN: [id number] Neck Size: 20.50 <br> <br> CLINICAL INFORMATION The patient is referred for a BiPAP titration to treat sleep apnea.  05/2018 NPSG - AHI 12/h, low sat 83%, 164 mins with sat<88%  SLEEP STUDY TECHNIQUE As per the AASM Manual for the Scoring of Sleep and Associated Events v2.3 (April 2016) with a hypopnea requiring 4% desaturations.  The channels recorded and monitored were frontal, central and occipital EEG, electrooculogram (EOG), submentalis EMG (chin), nasal and oral airflow, thoracic and abdominal wall motion, anterior tibialis EMG, snore microphone, electrocardiogram, and pulse oximetry. Bilevel positive airway pressure (BPAP) was initiated at the beginning of the study and titrated to treat sleep-disordered breathing.  MEDICATIONS Medications self-administered by patient taken the night of the study : N/A  RESPIRATORY PARAMETERS Optimal IPAP Pressure (cm): 20 AHI at Optimal Pressure (/hr) 2.9 Optimal EPAP Pressure (cm): 16   Overall Minimal O2 (%): 80.0 Minimal O2 at Optimal Pressure (%): 86.0 SLEEP ARCHITECTURE Start Time: 9:26:20 PM Stop Time: 4:25:59 AM Total Time (min): 419.6 Total Sleep Time (min): 338 Sleep Latency (min): 28.5 Sleep Efficiency (%): 80.5% REM Latency (min): 228.0 WASO (min): 53.2 Stage N1 (%): 0.9% Stage N2 (%): 83.1% Stage N3 (%): 0.0% Stage R (%): 16 Supine (%): 0.00 Arousal Index (/hr): 16.3     CARDIAC DATA The 2 lead EKG demonstrated sinus rhythm. The mean heart rate was 65.1 beats per minute. Other EKG findings include: None.   LEG MOVEMENT DATA The total Periodic Limb Movements of Sleep (PLMS) were 0. The PLMS index was 0.0. A PLMS index of <15 is considered normal  in adults.  IMPRESSIONS - An optimal PAP pressure was selected for this patient ( 20 / 16cm of water). Long period of REM sleep observed on 19/15 cm which appears to be adequate. She could not tolerate high pressures on CPAP & was switched to bilevel - Central sleep apnea was not noted during this titration (CAI = 2.1/h). - Severe oxygen desaturations were observed during this titration (min O2 = 80.0%). - The patient snored with soft snoring volume. - No cardiac abnormalities were observed during this study. - Clinically significant periodic limb movements were not noted during this study. Arousals associated with PLMs were rare.   DIAGNOSIS - Obstructive Sleep Apnea (G47.33)   RECOMMENDATIONS - Trial of BiPAP therapy on 20/16 cm H2O with a Small size Fisher&Paykel Full Face Simplus mask and heated humidification. ALternatively, 19/15 cm or autobilevel may suffice - Avoid alcohol, sedatives and other CNS depressants that may worsen sleep apnea and disrupt normal sleep architecture. - Sleep hygiene should be reviewed to assess factors that may improve sleep quality. - Weight management and regular exercise should be initiated or continued. - Return to [facility] Center for re-evaluation after 4 weeks of therapy  [Electronically signed] 10/04/2022 10:03 AM  Cyril Mourning MD, ABSM Diplomate, American Board of Sleep Medicine NPI: [id number]

## 2022-10-05 DIAGNOSIS — M1990 Unspecified osteoarthritis, unspecified site: Secondary | ICD-10-CM | POA: Diagnosis not present

## 2022-10-05 DIAGNOSIS — J9611 Chronic respiratory failure with hypoxia: Secondary | ICD-10-CM | POA: Diagnosis not present

## 2022-10-05 DIAGNOSIS — I4510 Unspecified right bundle-branch block: Secondary | ICD-10-CM | POA: Diagnosis not present

## 2022-10-05 DIAGNOSIS — G629 Polyneuropathy, unspecified: Secondary | ICD-10-CM | POA: Diagnosis not present

## 2022-10-05 DIAGNOSIS — I5032 Chronic diastolic (congestive) heart failure: Secondary | ICD-10-CM | POA: Diagnosis not present

## 2022-10-05 DIAGNOSIS — I890 Lymphedema, not elsewhere classified: Secondary | ICD-10-CM | POA: Diagnosis not present

## 2022-10-05 DIAGNOSIS — I110 Hypertensive heart disease with heart failure: Secondary | ICD-10-CM | POA: Diagnosis not present

## 2022-10-05 DIAGNOSIS — I442 Atrioventricular block, complete: Secondary | ICD-10-CM | POA: Diagnosis not present

## 2022-10-05 DIAGNOSIS — I872 Venous insufficiency (chronic) (peripheral): Secondary | ICD-10-CM | POA: Diagnosis not present

## 2022-10-07 DIAGNOSIS — I110 Hypertensive heart disease with heart failure: Secondary | ICD-10-CM | POA: Diagnosis not present

## 2022-10-07 DIAGNOSIS — Z9181 History of falling: Secondary | ICD-10-CM | POA: Diagnosis not present

## 2022-10-07 DIAGNOSIS — M1990 Unspecified osteoarthritis, unspecified site: Secondary | ICD-10-CM | POA: Diagnosis not present

## 2022-10-07 DIAGNOSIS — I509 Heart failure, unspecified: Secondary | ICD-10-CM | POA: Diagnosis not present

## 2022-10-07 DIAGNOSIS — I890 Lymphedema, not elsewhere classified: Secondary | ICD-10-CM | POA: Diagnosis not present

## 2022-10-07 DIAGNOSIS — G629 Polyneuropathy, unspecified: Secondary | ICD-10-CM | POA: Diagnosis not present

## 2022-10-07 DIAGNOSIS — J9611 Chronic respiratory failure with hypoxia: Secondary | ICD-10-CM | POA: Diagnosis not present

## 2022-10-07 DIAGNOSIS — I5032 Chronic diastolic (congestive) heart failure: Secondary | ICD-10-CM | POA: Diagnosis not present

## 2022-10-09 ENCOUNTER — Ambulatory Visit (INDEPENDENT_AMBULATORY_CARE_PROVIDER_SITE_OTHER): Payer: Medicare PPO

## 2022-10-09 DIAGNOSIS — I495 Sick sinus syndrome: Secondary | ICD-10-CM | POA: Diagnosis not present

## 2022-10-09 DIAGNOSIS — I442 Atrioventricular block, complete: Secondary | ICD-10-CM

## 2022-10-10 LAB — CUP PACEART REMOTE DEVICE CHECK
Battery Voltage: 70
Date Time Interrogation Session: 20240610073326
Implantable Lead Connection Status: 753985
Implantable Lead Connection Status: 753985
Implantable Lead Implant Date: 20200311
Implantable Lead Implant Date: 20200311
Implantable Lead Location: 753859
Implantable Lead Location: 753860
Implantable Lead Model: 377
Implantable Lead Model: 377
Implantable Lead Serial Number: 81024123
Implantable Lead Serial Number: 81075341
Implantable Pulse Generator Implant Date: 20200311
Pulse Gen Model: 407145
Pulse Gen Serial Number: 69550148

## 2022-10-16 ENCOUNTER — Telehealth: Payer: Self-pay | Admitting: Pulmonary Disease

## 2022-10-16 NOTE — Telephone Encounter (Signed)
ATC pt daughter LVM for her to call office back 

## 2022-10-16 NOTE — Telephone Encounter (Signed)
[place] daughter states patient needs PFT and appointment in [place] with Dr. Vassie Loll. [place] phone number is [phone].

## 2022-10-20 DIAGNOSIS — I4510 Unspecified right bundle-branch block: Secondary | ICD-10-CM | POA: Diagnosis not present

## 2022-10-20 DIAGNOSIS — G629 Polyneuropathy, unspecified: Secondary | ICD-10-CM | POA: Diagnosis not present

## 2022-10-20 DIAGNOSIS — M170 Bilateral primary osteoarthritis of knee: Secondary | ICD-10-CM | POA: Diagnosis not present

## 2022-10-20 DIAGNOSIS — I442 Atrioventricular block, complete: Secondary | ICD-10-CM | POA: Diagnosis not present

## 2022-10-20 DIAGNOSIS — I5032 Chronic diastolic (congestive) heart failure: Secondary | ICD-10-CM | POA: Diagnosis not present

## 2022-10-20 DIAGNOSIS — I110 Hypertensive heart disease with heart failure: Secondary | ICD-10-CM | POA: Diagnosis not present

## 2022-10-20 DIAGNOSIS — J9611 Chronic respiratory failure with hypoxia: Secondary | ICD-10-CM | POA: Diagnosis not present

## 2022-10-20 DIAGNOSIS — M1990 Unspecified osteoarthritis, unspecified site: Secondary | ICD-10-CM | POA: Diagnosis not present

## 2022-10-20 DIAGNOSIS — M25461 Effusion, right knee: Secondary | ICD-10-CM | POA: Diagnosis not present

## 2022-10-20 DIAGNOSIS — I890 Lymphedema, not elsewhere classified: Secondary | ICD-10-CM | POA: Diagnosis not present

## 2022-10-20 DIAGNOSIS — I872 Venous insufficiency (chronic) (peripheral): Secondary | ICD-10-CM | POA: Diagnosis not present

## 2022-10-23 NOTE — Telephone Encounter (Signed)
PFT and ov scheduled for 12/20/22

## 2022-10-26 DIAGNOSIS — M1990 Unspecified osteoarthritis, unspecified site: Secondary | ICD-10-CM | POA: Diagnosis not present

## 2022-10-26 DIAGNOSIS — I442 Atrioventricular block, complete: Secondary | ICD-10-CM | POA: Diagnosis not present

## 2022-10-26 DIAGNOSIS — G629 Polyneuropathy, unspecified: Secondary | ICD-10-CM | POA: Diagnosis not present

## 2022-10-26 DIAGNOSIS — I5032 Chronic diastolic (congestive) heart failure: Secondary | ICD-10-CM | POA: Diagnosis not present

## 2022-10-26 DIAGNOSIS — I4510 Unspecified right bundle-branch block: Secondary | ICD-10-CM | POA: Diagnosis not present

## 2022-10-26 DIAGNOSIS — J9611 Chronic respiratory failure with hypoxia: Secondary | ICD-10-CM | POA: Diagnosis not present

## 2022-10-26 DIAGNOSIS — I872 Venous insufficiency (chronic) (peripheral): Secondary | ICD-10-CM | POA: Diagnosis not present

## 2022-10-26 DIAGNOSIS — I890 Lymphedema, not elsewhere classified: Secondary | ICD-10-CM | POA: Diagnosis not present

## 2022-10-26 DIAGNOSIS — I110 Hypertensive heart disease with heart failure: Secondary | ICD-10-CM | POA: Diagnosis not present

## 2022-11-01 NOTE — Progress Notes (Signed)
Remote pacemaker transmission.   

## 2022-11-03 DIAGNOSIS — G44009 Cluster headache syndrome, unspecified, not intractable: Secondary | ICD-10-CM | POA: Diagnosis not present

## 2022-11-06 DIAGNOSIS — M1990 Unspecified osteoarthritis, unspecified site: Secondary | ICD-10-CM | POA: Diagnosis not present

## 2022-11-06 DIAGNOSIS — I110 Hypertensive heart disease with heart failure: Secondary | ICD-10-CM | POA: Diagnosis not present

## 2022-11-06 DIAGNOSIS — I509 Heart failure, unspecified: Secondary | ICD-10-CM | POA: Diagnosis not present

## 2022-11-06 DIAGNOSIS — I5032 Chronic diastolic (congestive) heart failure: Secondary | ICD-10-CM | POA: Diagnosis not present

## 2022-11-06 DIAGNOSIS — G629 Polyneuropathy, unspecified: Secondary | ICD-10-CM | POA: Diagnosis not present

## 2022-11-06 DIAGNOSIS — Z9181 History of falling: Secondary | ICD-10-CM | POA: Diagnosis not present

## 2022-11-06 DIAGNOSIS — I890 Lymphedema, not elsewhere classified: Secondary | ICD-10-CM | POA: Diagnosis not present

## 2022-11-06 DIAGNOSIS — J9611 Chronic respiratory failure with hypoxia: Secondary | ICD-10-CM | POA: Diagnosis not present

## 2022-11-10 DIAGNOSIS — I110 Hypertensive heart disease with heart failure: Secondary | ICD-10-CM | POA: Diagnosis not present

## 2022-11-10 DIAGNOSIS — J9611 Chronic respiratory failure with hypoxia: Secondary | ICD-10-CM | POA: Diagnosis not present

## 2022-11-10 DIAGNOSIS — I4510 Unspecified right bundle-branch block: Secondary | ICD-10-CM | POA: Diagnosis not present

## 2022-11-10 DIAGNOSIS — G629 Polyneuropathy, unspecified: Secondary | ICD-10-CM | POA: Diagnosis not present

## 2022-11-10 DIAGNOSIS — I872 Venous insufficiency (chronic) (peripheral): Secondary | ICD-10-CM | POA: Diagnosis not present

## 2022-11-10 DIAGNOSIS — I442 Atrioventricular block, complete: Secondary | ICD-10-CM | POA: Diagnosis not present

## 2022-11-10 DIAGNOSIS — I890 Lymphedema, not elsewhere classified: Secondary | ICD-10-CM | POA: Diagnosis not present

## 2022-11-10 DIAGNOSIS — I5032 Chronic diastolic (congestive) heart failure: Secondary | ICD-10-CM | POA: Diagnosis not present

## 2022-11-10 DIAGNOSIS — M1990 Unspecified osteoarthritis, unspecified site: Secondary | ICD-10-CM | POA: Diagnosis not present

## 2022-11-21 DIAGNOSIS — I4510 Unspecified right bundle-branch block: Secondary | ICD-10-CM | POA: Diagnosis not present

## 2022-11-21 DIAGNOSIS — J9611 Chronic respiratory failure with hypoxia: Secondary | ICD-10-CM | POA: Diagnosis not present

## 2022-11-21 DIAGNOSIS — I890 Lymphedema, not elsewhere classified: Secondary | ICD-10-CM | POA: Diagnosis not present

## 2022-11-21 DIAGNOSIS — M1990 Unspecified osteoarthritis, unspecified site: Secondary | ICD-10-CM | POA: Diagnosis not present

## 2022-11-21 DIAGNOSIS — G629 Polyneuropathy, unspecified: Secondary | ICD-10-CM | POA: Diagnosis not present

## 2022-11-21 DIAGNOSIS — I110 Hypertensive heart disease with heart failure: Secondary | ICD-10-CM | POA: Diagnosis not present

## 2022-11-21 DIAGNOSIS — I872 Venous insufficiency (chronic) (peripheral): Secondary | ICD-10-CM | POA: Diagnosis not present

## 2022-11-21 DIAGNOSIS — I5032 Chronic diastolic (congestive) heart failure: Secondary | ICD-10-CM | POA: Diagnosis not present

## 2022-11-21 DIAGNOSIS — I442 Atrioventricular block, complete: Secondary | ICD-10-CM | POA: Diagnosis not present

## 2022-11-22 ENCOUNTER — Ambulatory Visit: Payer: Medicare PPO | Attending: Cardiology | Admitting: Cardiology

## 2022-11-22 ENCOUNTER — Encounter: Payer: Self-pay | Admitting: Cardiology

## 2022-11-22 VITALS — BP 124/64 | HR 65 | Ht 66.0 in | Wt 263.8 lb

## 2022-11-22 DIAGNOSIS — I5032 Chronic diastolic (congestive) heart failure: Secondary | ICD-10-CM

## 2022-11-22 DIAGNOSIS — Z952 Presence of prosthetic heart valve: Secondary | ICD-10-CM | POA: Diagnosis not present

## 2022-11-22 DIAGNOSIS — I10 Essential (primary) hypertension: Secondary | ICD-10-CM

## 2022-11-22 MED ORDER — TORSEMIDE 20 MG PO TABS: 40.0000 mg | ORAL_TABLET | Freq: Two times a day (BID) | ORAL | 6 refills | Status: DC

## 2022-11-22 NOTE — Progress Notes (Signed)
Cardiology Office Note  Date: 11/22/2022   ID: Dawn Gonzales, DOB 18-Sep-1940, MRN [id number]  History of Present Illness: Dawn Gonzales is an 82 y.o. female last seen in July 2023.  She is here today with her daughter for a follow-up visit.  She is in a wheelchair today, but does get up with assistance at least once or twice a day to ambulate.  Oxygen saturations have been relatively stable in the mid 90s, she still uses supplemental oxygen as needed.  No chest pain or palpitations.  Biotronik pacemaker in place with follow-up by Dr. Ladona Ridgel.  Device interrogation in June revealed normal function.  I reviewed her medications.  We discussed increasing Demadex to 40 mg twice daily as she has had some increase in weight with leg swelling in the evenings.  I reviewed her lab work from May.  She is due for follow-up echocardiogram.  Physical Exam: VS:  BP 124/64   Pulse 65   Ht 5\' 6"  (1.676 m)   Wt 263 lb 12.8 oz (119.7 kg)   SpO2 97%   BMI 42.58 kg/m , BMI Body mass index is 42.58 kg/m.  Wt Readings from Last 3 Encounters:  11/22/22 263 lb 12.8 oz (119.7 kg)  09/22/22 255 lb (115.7 kg)  08/01/22 256 lb (116.1 kg)    General: Patient appears comfortable at rest. HEENT: Conjunctiva and lids normal. Neck: Supple, no elevated JVP or carotid bruits. Lungs: Clear to auscultation, nonlabored breathing at rest. Cardiac: Regular rate and rhythm, no S3, 2/6 systolic murmur. Extremities: Chronic appearing edema with venous stasis.  ECG:  An ECG dated 04/05/2022 was personally reviewed today and demonstrated:  Tachycardia with right bundle branch block.  Labwork: 04/05/2022: Magnesium 2.2 05/09/2022: ALT 19; AST 26; B Natriuretic Peptide 86.0 05/12/2022: BUN 36; Creatinine, Ser 0.97; Hemoglobin 11.7; Platelets 218; Potassium 4.0; Sodium 138  May 2024: Hemoglobin 11.4, platelets 244, BUN 23, creatinine 0.98, potassium 4.8, AST 16, ALT 12, cholesterol 151, triglycerides 117,  HDL 45, LDL 85, hemoglobin A1c 5.3%  Other Studies Reviewed Today:  Echocardiogram 11/28/2021:  1. Left ventricular ejection fraction, by estimation, is 55 to 60%. The  left ventricle has normal function. The left ventricle has no regional  wall motion abnormalities. There is moderate concentric left ventricular  hypertrophy. Left ventricular  diastolic parameters are consistent with Grade I diastolic dysfunction  (impaired relaxation).   2. Right ventricular systolic function is normal. The right ventricular  size is normal.   3. Left atrial size was mildly dilated.   4. Mild mitral valve regurgitation. Moderate mitral annular  calcification.   5. S/p TAVR (26 mm Edwards Sapien 3 valve, 07/09/18). Peak and mean  gradients through the valve are 34 and 13 mm Hg respectively. Compared to  echo report from 2021, mean gradient is lower (19 to 13 mmHg). There is  mild aortic insufficiency that appears  perivalvular. OVerall does not appear to be signficantly different from  previous echo in March 2021 . The aortic valve has been repaired/replaced.  Aortic valve regurgitation is mild.   6. The inferior vena cava is dilated in size with >50% respiratory  variability, suggesting right atrial pressure of 8 mmHg.   Assessment and Plan:  1.  History of aortic stenosis status post 26 mm Edwards SAPIEN 3 TAVR in March 2020.  Echocardiogram in July of last year revealed LVEF 55 to 60% with stable TAVR prosthesis, 13 mm mean AV gradient and mild paravalvular regurgitation.  She is due for a follow-up echocardiogram which will be arranged.  Continue aspirin.  2.  Essential hypertension.  Blood pressure is well-controlled today.  3.  Mixed hyperlipidemia.  LDL 85 in May.  Continue Pravachol.  4.  HFpEF, LVEF 55 to 60% and RV contraction normal by echocardiogram in July 2023.  Plan to increase Demadex to 40 mg twice daily with continued potassium supplement.  Not an optimal candidate for SGLT2  inhibitor in light of limited mobility and increased infection risk.  Disposition:  Follow up  6 months.  Signed, Jonelle Sidle, M.D., F.A.C.C. [facility]

## 2022-11-22 NOTE — Patient Instructions (Addendum)
Medication Instructions:  Your physician has recommended you make the following change in your medication:  Increase torsemide to 40 mg twice daily Continue all other medications as prescribed  Labwork: none  Testing/Procedures: Your physician has requested that you have an echocardiogram. Echocardiography is a painless test that uses sound waves to create images of your heart. It provides your doctor with information about the size and shape of your heart and how well your heart's chambers and valves are working. This procedure takes approximately one hour. There are no restrictions for this procedure. Please do NOT wear cologne, perfume, aftershave, or lotions (deodorant is allowed). Please arrive 15 minutes prior to your appointment time.  Follow-Up: Your physician recommends that you schedule a follow-up appointment in: 6 months  Any Other Special Instructions Will Be Listed Below (If Applicable).  If you need a refill on your cardiac medications before your next appointment, please call your pharmacy.

## 2022-11-24 ENCOUNTER — Ambulatory Visit (HOSPITAL_COMMUNITY): Admission: RE | Admit: 2022-11-24 | Payer: Medicare PPO | Source: Ambulatory Visit

## 2022-11-24 ENCOUNTER — Encounter (HOSPITAL_COMMUNITY): Payer: Self-pay

## 2022-12-04 ENCOUNTER — Ambulatory Visit: Payer: Medicare PPO

## 2022-12-04 DIAGNOSIS — Z952 Presence of prosthetic heart valve: Secondary | ICD-10-CM

## 2022-12-04 DIAGNOSIS — G44009 Cluster headache syndrome, unspecified, not intractable: Secondary | ICD-10-CM | POA: Diagnosis not present

## 2022-12-04 LAB — ECHOCARDIOGRAM COMPLETE
AR max vel: 1.24 cm2
AV Area VTI: 1.22 cm2
AV Area mean vel: 1.21 cm2
AV Mean grad: 14.7 mmHg
AV Peak grad: 25.7 mmHg
Ao pk vel: 2.54 m/s
Area-P 1/2: 3.16 cm2
Calc EF: 74.1 %
MV M vel: 4.61 m/s
MV Peak grad: 85 mmHg
MV VTI: 1.56 cm2
P 1/2 time: 646 msec
S' Lateral: 2.2 cm
Single Plane A2C EF: 61.1 %
Single Plane A4C EF: 77.1 %

## 2022-12-07 DIAGNOSIS — G629 Polyneuropathy, unspecified: Secondary | ICD-10-CM | POA: Diagnosis not present

## 2022-12-07 DIAGNOSIS — I509 Heart failure, unspecified: Secondary | ICD-10-CM | POA: Diagnosis not present

## 2022-12-07 DIAGNOSIS — I890 Lymphedema, not elsewhere classified: Secondary | ICD-10-CM | POA: Diagnosis not present

## 2022-12-07 DIAGNOSIS — J9611 Chronic respiratory failure with hypoxia: Secondary | ICD-10-CM | POA: Diagnosis not present

## 2022-12-07 DIAGNOSIS — M1990 Unspecified osteoarthritis, unspecified site: Secondary | ICD-10-CM | POA: Diagnosis not present

## 2022-12-07 DIAGNOSIS — Z9181 History of falling: Secondary | ICD-10-CM | POA: Diagnosis not present

## 2022-12-07 DIAGNOSIS — I110 Hypertensive heart disease with heart failure: Secondary | ICD-10-CM | POA: Diagnosis not present

## 2022-12-07 DIAGNOSIS — I5032 Chronic diastolic (congestive) heart failure: Secondary | ICD-10-CM | POA: Diagnosis not present

## 2022-12-08 DIAGNOSIS — I872 Venous insufficiency (chronic) (peripheral): Secondary | ICD-10-CM | POA: Diagnosis not present

## 2022-12-08 DIAGNOSIS — I890 Lymphedema, not elsewhere classified: Secondary | ICD-10-CM | POA: Diagnosis not present

## 2022-12-08 DIAGNOSIS — J9611 Chronic respiratory failure with hypoxia: Secondary | ICD-10-CM | POA: Diagnosis not present

## 2022-12-08 DIAGNOSIS — I110 Hypertensive heart disease with heart failure: Secondary | ICD-10-CM | POA: Diagnosis not present

## 2022-12-08 DIAGNOSIS — M1990 Unspecified osteoarthritis, unspecified site: Secondary | ICD-10-CM | POA: Diagnosis not present

## 2022-12-08 DIAGNOSIS — I4510 Unspecified right bundle-branch block: Secondary | ICD-10-CM | POA: Diagnosis not present

## 2022-12-08 DIAGNOSIS — G629 Polyneuropathy, unspecified: Secondary | ICD-10-CM | POA: Diagnosis not present

## 2022-12-08 DIAGNOSIS — I442 Atrioventricular block, complete: Secondary | ICD-10-CM | POA: Diagnosis not present

## 2022-12-08 DIAGNOSIS — I5032 Chronic diastolic (congestive) heart failure: Secondary | ICD-10-CM | POA: Diagnosis not present

## 2022-12-18 DIAGNOSIS — I442 Atrioventricular block, complete: Secondary | ICD-10-CM | POA: Diagnosis not present

## 2022-12-18 DIAGNOSIS — I890 Lymphedema, not elsewhere classified: Secondary | ICD-10-CM | POA: Diagnosis not present

## 2022-12-18 DIAGNOSIS — I872 Venous insufficiency (chronic) (peripheral): Secondary | ICD-10-CM | POA: Diagnosis not present

## 2022-12-18 DIAGNOSIS — M1990 Unspecified osteoarthritis, unspecified site: Secondary | ICD-10-CM | POA: Diagnosis not present

## 2022-12-18 DIAGNOSIS — I5032 Chronic diastolic (congestive) heart failure: Secondary | ICD-10-CM | POA: Diagnosis not present

## 2022-12-18 DIAGNOSIS — I4510 Unspecified right bundle-branch block: Secondary | ICD-10-CM | POA: Diagnosis not present

## 2022-12-18 DIAGNOSIS — J9611 Chronic respiratory failure with hypoxia: Secondary | ICD-10-CM | POA: Diagnosis not present

## 2022-12-18 DIAGNOSIS — I110 Hypertensive heart disease with heart failure: Secondary | ICD-10-CM | POA: Diagnosis not present

## 2022-12-18 DIAGNOSIS — G629 Polyneuropathy, unspecified: Secondary | ICD-10-CM | POA: Diagnosis not present

## 2022-12-20 ENCOUNTER — Encounter (HOSPITAL_BASED_OUTPATIENT_CLINIC_OR_DEPARTMENT_OTHER): Payer: Medicare PPO

## 2022-12-20 ENCOUNTER — Ambulatory Visit (INDEPENDENT_AMBULATORY_CARE_PROVIDER_SITE_OTHER): Payer: Medicare PPO | Admitting: Pulmonary Disease

## 2022-12-20 ENCOUNTER — Encounter (HOSPITAL_BASED_OUTPATIENT_CLINIC_OR_DEPARTMENT_OTHER): Payer: Self-pay | Admitting: Pulmonary Disease

## 2022-12-20 VITALS — BP 138/72 | HR 66 | Resp 14 | Ht 66.0 in | Wt 262.0 lb

## 2022-12-20 DIAGNOSIS — G4733 Obstructive sleep apnea (adult) (pediatric): Secondary | ICD-10-CM

## 2022-12-20 DIAGNOSIS — J9611 Chronic respiratory failure with hypoxia: Secondary | ICD-10-CM | POA: Diagnosis not present

## 2022-12-20 DIAGNOSIS — J9612 Chronic respiratory failure with hypercapnia: Secondary | ICD-10-CM | POA: Diagnosis not present

## 2022-12-20 NOTE — Patient Instructions (Signed)
X prescription for auto BiPAP to DME EPAP minimum 12, pressure support +4 cm, IPAP maximum 20 Small size fullface mask  2 L oxygen blended in  We will also asked them to help you with oxygen connection   x schedule PFTs

## 2022-12-20 NOTE — Progress Notes (Signed)
   Subjective:    Patient ID: Dawn Gonzales, female    DOB: 04-19-1941, 82 y.o.   MRN: [id number]  HPI  82-yo woman for FU of  chronic hypoxic and hypercarbic respiratory failure. She is on 3 L of oxygen She was hospitalized 05/2022 for recurrent falls and hypoxia, required transient BiPAP, discharged to rehab on 4 L. Prior to this she was hospitalized in December for CHF exacerbation ABG was 7.34/84/61/91% on 32% FiO2 Echo 10/2021 showed normal LVEF with grade 1 diastolic dysfunction.  She is maintained on 40 mg torsemide in the morning and 20 mg in the afternoon Daughter obtained an AirSense auto CPAP on her own   PMH -TAVR,  complete heart block,  diastolic CHF,  hypertension  Venous stasis disease  53-month follow-up visit.  She arrives on oxygen in a wheelchair accompanied by her daughter Initial OV 07/2022 -she has her on auto CPAP machine.  She is maintained on 2 L of oxygen at all times.    PFTs not done yet We reviewed CPAP titration study which showed need for  bilevel With oxygen blended in She admits to not using her CPAP every night.  She is unable to figure out how oxygen blended into CPAP hose  She is compliant with torsemide 40 mg twice daily.  Daughter shows record of her oxygen saturation staying up.  She is putting in about 200 steps 3 times daily  Significant tests/ events reviewed  05/2018 NPSG - AHI 12/h, low sat 83%, 164 mins with sat<88%      Review of Systems neg for any significant sore throat, dysphagia, itching, sneezing, nasal congestion or excess/ purulent secretions, fever, chills, sweats, unintended wt loss, pleuritic or exertional cp, hempoptysis, orthopnea pnd or change in chronic leg swelling. Also denies presyncope, palpitations, heartburn, abdominal pain, nausea, vomiting, diarrhea or change in bowel or urinary habits, dysuria,hematuria, rash, arthralgias, visual complaints, headache, numbness weakness or ataxia.     Objective:    Physical Exam  Gen. Pleasant, obese, in no distress, on oxygen ENT - no lesions, no post nasal drip Neck: No JVD, no thyromegaly, no carotid bruits Lungs: no use of accessory muscles, no dullness to percussion, decreased without rales or rhonchi  Cardiovascular: Rhythm regular, heart sounds  normal, no murmurs or gallops, 1+ peripheral edema Musculoskeletal: No deformities, no cyanosis or clubbing , no tremors       Assessment & Plan:

## 2022-12-20 NOTE — Assessment & Plan Note (Signed)
Etiology remains unclear. 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 Will ask her to schedule PFTs Continue 2 to 3 L of oxygen at all times and blend this into bilevel

## 2022-12-20 NOTE — Assessment & Plan Note (Signed)
We will send in prescription for auto bilevel EPAP minimum 10 cm, pressure support +4, IPAP maximum 20 cm to DME with 2 L oxygen blended in  Weight loss encouraged, compliance with goal of at least 4-6 hrs every night is the expectation. Advised against medications with sedative side effects Cautioned against driving when sleepy - understanding that sleepiness will vary on a day to day basis

## 2023-01-04 DIAGNOSIS — G44009 Cluster headache syndrome, unspecified, not intractable: Secondary | ICD-10-CM | POA: Diagnosis not present

## 2023-01-04 DIAGNOSIS — G4733 Obstructive sleep apnea (adult) (pediatric): Secondary | ICD-10-CM | POA: Diagnosis not present

## 2023-01-07 DIAGNOSIS — I110 Hypertensive heart disease with heart failure: Secondary | ICD-10-CM | POA: Diagnosis not present

## 2023-01-07 DIAGNOSIS — M1990 Unspecified osteoarthritis, unspecified site: Secondary | ICD-10-CM | POA: Diagnosis not present

## 2023-01-07 DIAGNOSIS — I890 Lymphedema, not elsewhere classified: Secondary | ICD-10-CM | POA: Diagnosis not present

## 2023-01-07 DIAGNOSIS — I509 Heart failure, unspecified: Secondary | ICD-10-CM | POA: Diagnosis not present

## 2023-01-07 DIAGNOSIS — I5032 Chronic diastolic (congestive) heart failure: Secondary | ICD-10-CM | POA: Diagnosis not present

## 2023-01-07 DIAGNOSIS — Z9181 History of falling: Secondary | ICD-10-CM | POA: Diagnosis not present

## 2023-01-07 DIAGNOSIS — J9611 Chronic respiratory failure with hypoxia: Secondary | ICD-10-CM | POA: Diagnosis not present

## 2023-01-07 DIAGNOSIS — G629 Polyneuropathy, unspecified: Secondary | ICD-10-CM | POA: Diagnosis not present

## 2023-01-08 ENCOUNTER — Ambulatory Visit: Payer: Medicare PPO

## 2023-01-08 DIAGNOSIS — I442 Atrioventricular block, complete: Secondary | ICD-10-CM | POA: Diagnosis not present

## 2023-01-08 LAB — CUP PACEART REMOTE DEVICE CHECK
Date Time Interrogation Session: 20240909112159
Implantable Lead Connection Status: 753985
Implantable Lead Connection Status: 753985
Implantable Lead Implant Date: 20200311
Implantable Lead Implant Date: 20200311
Implantable Lead Location: 753859
Implantable Lead Location: 753860
Implantable Lead Model: 377
Implantable Lead Model: 377
Implantable Lead Serial Number: 81024123
Implantable Lead Serial Number: 81075341
Implantable Pulse Generator Implant Date: 20200311
Lead Channel Setting Pacing Amplitude: 1.6 V
Lead Channel Setting Pacing Amplitude: 1.8 V
Lead Channel Setting Pacing Pulse Width: 0.4 ms
Pulse Gen Model: 407145
Pulse Gen Serial Number: 69550148

## 2023-01-25 NOTE — Progress Notes (Signed)
Remote pacemaker transmission.   

## 2023-01-31 ENCOUNTER — Ambulatory Visit (HOSPITAL_COMMUNITY): Admission: RE | Admit: 2023-01-31 | Payer: Medicare PPO | Source: Ambulatory Visit

## 2023-02-03 DIAGNOSIS — G4733 Obstructive sleep apnea (adult) (pediatric): Secondary | ICD-10-CM | POA: Diagnosis not present

## 2023-02-03 DIAGNOSIS — G44009 Cluster headache syndrome, unspecified, not intractable: Secondary | ICD-10-CM | POA: Diagnosis not present

## 2023-02-06 DIAGNOSIS — M1990 Unspecified osteoarthritis, unspecified site: Secondary | ICD-10-CM | POA: Diagnosis not present

## 2023-02-06 DIAGNOSIS — I509 Heart failure, unspecified: Secondary | ICD-10-CM | POA: Diagnosis not present

## 2023-02-06 DIAGNOSIS — G629 Polyneuropathy, unspecified: Secondary | ICD-10-CM | POA: Diagnosis not present

## 2023-02-06 DIAGNOSIS — Z9181 History of falling: Secondary | ICD-10-CM | POA: Diagnosis not present

## 2023-02-06 DIAGNOSIS — J9611 Chronic respiratory failure with hypoxia: Secondary | ICD-10-CM | POA: Diagnosis not present

## 2023-02-06 DIAGNOSIS — I5032 Chronic diastolic (congestive) heart failure: Secondary | ICD-10-CM | POA: Diagnosis not present

## 2023-02-06 DIAGNOSIS — I890 Lymphedema, not elsewhere classified: Secondary | ICD-10-CM | POA: Diagnosis not present

## 2023-02-06 DIAGNOSIS — I110 Hypertensive heart disease with heart failure: Secondary | ICD-10-CM | POA: Diagnosis not present

## 2023-02-06 DIAGNOSIS — G4733 Obstructive sleep apnea (adult) (pediatric): Secondary | ICD-10-CM | POA: Diagnosis not present

## 2023-02-16 DIAGNOSIS — M25461 Effusion, right knee: Secondary | ICD-10-CM | POA: Diagnosis not present

## 2023-02-16 DIAGNOSIS — M170 Bilateral primary osteoarthritis of knee: Secondary | ICD-10-CM | POA: Diagnosis not present

## 2023-03-06 DIAGNOSIS — G44009 Cluster headache syndrome, unspecified, not intractable: Secondary | ICD-10-CM | POA: Diagnosis not present

## 2023-03-06 DIAGNOSIS — G4733 Obstructive sleep apnea (adult) (pediatric): Secondary | ICD-10-CM | POA: Diagnosis not present

## 2023-03-09 DIAGNOSIS — G629 Polyneuropathy, unspecified: Secondary | ICD-10-CM | POA: Diagnosis not present

## 2023-03-09 DIAGNOSIS — I890 Lymphedema, not elsewhere classified: Secondary | ICD-10-CM | POA: Diagnosis not present

## 2023-03-09 DIAGNOSIS — I110 Hypertensive heart disease with heart failure: Secondary | ICD-10-CM | POA: Diagnosis not present

## 2023-03-09 DIAGNOSIS — J9611 Chronic respiratory failure with hypoxia: Secondary | ICD-10-CM | POA: Diagnosis not present

## 2023-03-09 DIAGNOSIS — I509 Heart failure, unspecified: Secondary | ICD-10-CM | POA: Diagnosis not present

## 2023-03-09 DIAGNOSIS — M1990 Unspecified osteoarthritis, unspecified site: Secondary | ICD-10-CM | POA: Diagnosis not present

## 2023-03-09 DIAGNOSIS — I5032 Chronic diastolic (congestive) heart failure: Secondary | ICD-10-CM | POA: Diagnosis not present

## 2023-03-09 DIAGNOSIS — Z9181 History of falling: Secondary | ICD-10-CM | POA: Diagnosis not present

## 2023-03-22 ENCOUNTER — Ambulatory Visit (HOSPITAL_BASED_OUTPATIENT_CLINIC_OR_DEPARTMENT_OTHER): Payer: Medicare PPO | Admitting: Pulmonary Disease

## 2023-03-22 ENCOUNTER — Encounter (HOSPITAL_BASED_OUTPATIENT_CLINIC_OR_DEPARTMENT_OTHER): Payer: Self-pay | Admitting: Pulmonary Disease

## 2023-03-22 VITALS — BP 132/70 | HR 69

## 2023-03-22 DIAGNOSIS — J9612 Chronic respiratory failure with hypercapnia: Secondary | ICD-10-CM | POA: Diagnosis not present

## 2023-03-22 DIAGNOSIS — G4733 Obstructive sleep apnea (adult) (pediatric): Secondary | ICD-10-CM

## 2023-03-22 DIAGNOSIS — J9611 Chronic respiratory failure with hypoxia: Secondary | ICD-10-CM | POA: Diagnosis not present

## 2023-03-22 DIAGNOSIS — I5032 Chronic diastolic (congestive) heart failure: Secondary | ICD-10-CM

## 2023-03-22 LAB — PULMONARY FUNCTION TEST
DL/VA % pred: 120 %
DL/VA: 4.85 ml/min/mmHg/L
DLCO cor % pred: 66 %
DLCO cor: 12.85 ml/min/mmHg
DLCO unc % pred: 66 %
DLCO unc: 12.85 ml/min/mmHg
FEF 25-75 Post: 2.2 L/s
FEF 25-75 Pre: 1.69 L/s
FEF2575-%Change-Post: 30 %
FEF2575-%Pred-Post: 161 %
FEF2575-%Pred-Pre: 123 %
FEV1-%Change-Post: 5 %
FEV1-%Pred-Post: 60 %
FEV1-%Pred-Pre: 57 %
FEV1-Post: 1.19 L
FEV1-Pre: 1.13 L
FEV1FVC-%Change-Post: -1 %
FEV1FVC-%Pred-Pre: 123 %
FEV6-%Change-Post: 6 %
FEV6-%Pred-Post: 53 %
FEV6-%Pred-Pre: 50 %
FEV6-Post: 1.33 L
FEV6-Pre: 1.25 L
FEV6FVC-%Pred-Post: 105 %
FEV6FVC-%Pred-Pre: 105 %
FVC-%Change-Post: 6 %
FVC-%Pred-Post: 50 %
FVC-%Pred-Pre: 47 %
FVC-Post: 1.33 L
FVC-Pre: 1.25 L
Post FEV1/FVC ratio: 89 %
Post FEV6/FVC ratio: 100 %
Pre FEV1/FVC ratio: 90 %
Pre FEV6/FVC Ratio: 100 %

## 2023-03-22 NOTE — Patient Instructions (Addendum)
Try to use the BiPAP at least 4 hours every night.  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

## 2023-03-22 NOTE — Assessment & Plan Note (Addendum)
Continue 3 L oxygen at all times. Try to increase number of steps daily.  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

## 2023-03-22 NOTE — Progress Notes (Signed)
Pre/Post Spirometry and DLCO Performed Today.

## 2023-03-22 NOTE — Progress Notes (Signed)
   Subjective:    Patient ID: Dawn Gonzales, female    DOB: 19-Jul-1940, 82 y.o.   MRN: [id number]  HPI  82-yo woman for FU of  chronic hypoxic and hypercarbic respiratory failure. - on auto bipap + 3 L of oxygen -remote smoker , quit 1980, less than 10 PYrs -Restrictive lung disease/OSA/OHS  She was hospitalized 05/2022 for recurrent falls and hypoxia, required transient BiPAP, discharged to rehab on 4 L. ABG was 7.34/84/61/91% on 32% FiO2 Daughter obtained an AirSense auto CPAP on her own .  We then obtained a sleep study and placed her on auto bilevel with oxygen blended in   PMH -TAVR,  complete heart block,  diastolic CHF,  hypertension  Venous stasis disease  50-month follow-up visit. She open PFTs today which we reviewed, mainly showing restrictive lung disease. Accompanied by daughter and granddaughter. They show me a record of her daily steps and oxygen saturations staying above 90%.  She is compliant with oxygen, uses 3 L continuous at home, uses 5 L POC when outside.  She arrives in a wheelchair today. She reports compliance with her BiPAP but when I showed her the compliance report she admits to decreased usage.  She sleeps in a recliner every night She has lost 10 pounds since her last visit Her vaccinations are up-to-date  Significant tests/ events reviewed   PAP titration 08/2022 >> 275 lbs  - She could not tolerate high pressures on CPAP & was switched to bilevel , optimal 20/16 05/2018 NPSG - AHI 12/h, low sat 83%, 164 mins with sat<88%  PFTs 03/2023 show moderate to severe restriction, FVC 47%, DLCO 66%, KCO normal  Review of Systems neg for any significant sore throat, dysphagia, itching, sneezing, nasal congestion or excess/ purulent secretions, fever, chills, sweats, unintended wt loss, pleuritic or exertional cp, hempoptysis, orthopnea pnd or change in chronic leg swelling. Also denies presyncope, palpitations, heartburn, abdominal pain, nausea, vomiting,  diarrhea or change in bowel or urinary habits, dysuria,hematuria, rash, arthralgias, visual complaints, headache, numbness weakness or ataxia.     Objective:   Physical Exam  Gen. Pleasant, elderly obese, in no distress ENT - no lesions, no post nasal drip Neck: No JVD, no thyromegaly, no carotid bruits Lungs: no use of accessory muscles, no dullness to percussion, decreased without rales or rhonchi  Cardiovascular: Rhythm regular, heart sounds  normal, no murmurs or gallops, 1+ peripheral edema Musculoskeletal: No deformities, no cyanosis or clubbing , no tremors       Assessment & Plan:

## 2023-03-22 NOTE — Patient Instructions (Signed)
Pre/Post Spirometry and DLCO Performed Today.

## 2023-03-22 NOTE — Assessment & Plan Note (Signed)
BiPAP download was reviewed which shows residual AHI of 7/hour.  Compliance is poor average close to 4 hours per night, average BiPAP settings are 18/14 cm. I emphasized compliance and discussed benefits of BiPAP.  Weight loss encouraged, compliance with goal of at least 4-6 hrs every night is the expectation. Advised against medications with sedative side effects Cautioned against driving when sleepy - understanding that sleepiness will vary on a day to day basis

## 2023-03-22 NOTE — Assessment & Plan Note (Signed)
Continue torsemide °

## 2023-03-28 DIAGNOSIS — N1831 Chronic kidney disease, stage 3a: Secondary | ICD-10-CM | POA: Diagnosis not present

## 2023-03-28 DIAGNOSIS — R600 Localized edema: Secondary | ICD-10-CM | POA: Diagnosis not present

## 2023-03-28 DIAGNOSIS — G629 Polyneuropathy, unspecified: Secondary | ICD-10-CM | POA: Diagnosis not present

## 2023-03-28 DIAGNOSIS — E782 Mixed hyperlipidemia: Secondary | ICD-10-CM | POA: Diagnosis not present

## 2023-03-28 DIAGNOSIS — J984 Other disorders of lung: Secondary | ICD-10-CM | POA: Insufficient documentation

## 2023-03-28 DIAGNOSIS — F329 Major depressive disorder, single episode, unspecified: Secondary | ICD-10-CM | POA: Diagnosis not present

## 2023-03-28 DIAGNOSIS — R7301 Impaired fasting glucose: Secondary | ICD-10-CM | POA: Diagnosis not present

## 2023-03-28 DIAGNOSIS — M170 Bilateral primary osteoarthritis of knee: Secondary | ICD-10-CM | POA: Diagnosis not present

## 2023-03-28 DIAGNOSIS — N3281 Overactive bladder: Secondary | ICD-10-CM | POA: Insufficient documentation

## 2023-03-28 DIAGNOSIS — I872 Venous insufficiency (chronic) (peripheral): Secondary | ICD-10-CM | POA: Diagnosis not present

## 2023-04-05 DIAGNOSIS — G4733 Obstructive sleep apnea (adult) (pediatric): Secondary | ICD-10-CM | POA: Diagnosis not present

## 2023-04-05 DIAGNOSIS — G44009 Cluster headache syndrome, unspecified, not intractable: Secondary | ICD-10-CM | POA: Diagnosis not present

## 2023-04-08 DIAGNOSIS — I890 Lymphedema, not elsewhere classified: Secondary | ICD-10-CM | POA: Diagnosis not present

## 2023-04-08 DIAGNOSIS — I110 Hypertensive heart disease with heart failure: Secondary | ICD-10-CM | POA: Diagnosis not present

## 2023-04-08 DIAGNOSIS — J9611 Chronic respiratory failure with hypoxia: Secondary | ICD-10-CM | POA: Diagnosis not present

## 2023-04-08 DIAGNOSIS — I5032 Chronic diastolic (congestive) heart failure: Secondary | ICD-10-CM | POA: Diagnosis not present

## 2023-04-08 DIAGNOSIS — M1990 Unspecified osteoarthritis, unspecified site: Secondary | ICD-10-CM | POA: Diagnosis not present

## 2023-04-08 DIAGNOSIS — G629 Polyneuropathy, unspecified: Secondary | ICD-10-CM | POA: Diagnosis not present

## 2023-04-08 DIAGNOSIS — Z9181 History of falling: Secondary | ICD-10-CM | POA: Diagnosis not present

## 2023-04-08 DIAGNOSIS — I509 Heart failure, unspecified: Secondary | ICD-10-CM | POA: Diagnosis not present

## 2023-04-09 ENCOUNTER — Ambulatory Visit (INDEPENDENT_AMBULATORY_CARE_PROVIDER_SITE_OTHER): Payer: Medicare PPO

## 2023-04-09 DIAGNOSIS — I442 Atrioventricular block, complete: Secondary | ICD-10-CM

## 2023-04-09 LAB — CUP PACEART REMOTE DEVICE CHECK
Date Time Interrogation Session: 20241209093817
Implantable Lead Connection Status: 753985
Implantable Lead Connection Status: 753985
Implantable Lead Implant Date: 20200311
Implantable Lead Implant Date: 20200311
Implantable Lead Location: 753859
Implantable Lead Location: 753860
Implantable Lead Model: 377
Implantable Lead Model: 377
Implantable Lead Serial Number: 81024123
Implantable Lead Serial Number: 81075341
Implantable Pulse Generator Implant Date: 20200311
Pulse Gen Model: 407145
Pulse Gen Serial Number: 69550148

## 2023-04-30 ENCOUNTER — Ambulatory Visit (HOSPITAL_COMMUNITY)
Admission: RE | Admit: 2023-04-30 | Discharge: 2023-04-30 | Disposition: A | Discharge status: Home / Self Care | Payer: Medicare PPO | Source: Ambulatory Visit | Attending: Family Medicine | Admitting: Family Medicine

## 2023-04-30 DIAGNOSIS — Z9049 Acquired absence of other specified parts of digestive tract: Secondary | ICD-10-CM | POA: Diagnosis not present

## 2023-04-30 DIAGNOSIS — R160 Hepatomegaly, not elsewhere classified: Secondary | ICD-10-CM | POA: Diagnosis not present

## 2023-04-30 DIAGNOSIS — N1330 Unspecified hydronephrosis: Secondary | ICD-10-CM | POA: Diagnosis not present

## 2023-04-30 DIAGNOSIS — N2889 Other specified disorders of kidney and ureter: Secondary | ICD-10-CM | POA: Insufficient documentation

## 2023-04-30 DIAGNOSIS — N134 Hydroureter: Secondary | ICD-10-CM | POA: Diagnosis not present

## 2023-04-30 MED ORDER — GADOBUTROL 1 MMOL/ML IV SOLN
10.0000 mL | Freq: Once | INTRAVENOUS | Status: AC | PRN
  Administered 2023-04-30: 10 mL via INTRAVENOUS

## 2023-05-02 DIAGNOSIS — Z7982 Long term (current) use of aspirin: Secondary | ICD-10-CM | POA: Insufficient documentation

## 2023-05-02 DIAGNOSIS — Z9981 Dependence on supplemental oxygen: Secondary | ICD-10-CM | POA: Insufficient documentation

## 2023-05-02 DIAGNOSIS — M170 Bilateral primary osteoarthritis of knee: Secondary | ICD-10-CM | POA: Insufficient documentation

## 2023-05-02 DIAGNOSIS — I2721 Secondary pulmonary arterial hypertension: Secondary | ICD-10-CM | POA: Insufficient documentation

## 2023-05-02 DIAGNOSIS — Z952 Presence of prosthetic heart valve: Secondary | ICD-10-CM | POA: Insufficient documentation

## 2023-05-02 DIAGNOSIS — I110 Hypertensive heart disease with heart failure: Secondary | ICD-10-CM | POA: Insufficient documentation

## 2023-05-02 DIAGNOSIS — Z9181 History of falling: Secondary | ICD-10-CM | POA: Insufficient documentation

## 2023-05-02 DIAGNOSIS — J9811 Atelectasis: Secondary | ICD-10-CM | POA: Insufficient documentation

## 2023-05-02 DIAGNOSIS — R918 Other nonspecific abnormal finding of lung field: Secondary | ICD-10-CM | POA: Insufficient documentation

## 2023-05-02 DIAGNOSIS — I5032 Chronic diastolic (congestive) heart failure: Secondary | ICD-10-CM | POA: Insufficient documentation

## 2023-05-06 DIAGNOSIS — G4733 Obstructive sleep apnea (adult) (pediatric): Secondary | ICD-10-CM | POA: Diagnosis not present

## 2023-05-06 DIAGNOSIS — G44009 Cluster headache syndrome, unspecified, not intractable: Secondary | ICD-10-CM | POA: Diagnosis not present

## 2023-05-09 DIAGNOSIS — I509 Heart failure, unspecified: Secondary | ICD-10-CM | POA: Diagnosis not present

## 2023-05-09 DIAGNOSIS — J9611 Chronic respiratory failure with hypoxia: Secondary | ICD-10-CM | POA: Diagnosis not present

## 2023-05-09 DIAGNOSIS — Z9181 History of falling: Secondary | ICD-10-CM | POA: Diagnosis not present

## 2023-05-09 DIAGNOSIS — G629 Polyneuropathy, unspecified: Secondary | ICD-10-CM | POA: Diagnosis not present

## 2023-05-09 DIAGNOSIS — I5032 Chronic diastolic (congestive) heart failure: Secondary | ICD-10-CM | POA: Diagnosis not present

## 2023-05-09 DIAGNOSIS — I110 Hypertensive heart disease with heart failure: Secondary | ICD-10-CM | POA: Diagnosis not present

## 2023-05-09 DIAGNOSIS — M1990 Unspecified osteoarthritis, unspecified site: Secondary | ICD-10-CM | POA: Diagnosis not present

## 2023-05-09 DIAGNOSIS — I890 Lymphedema, not elsewhere classified: Secondary | ICD-10-CM | POA: Diagnosis not present

## 2023-05-18 DIAGNOSIS — G4733 Obstructive sleep apnea (adult) (pediatric): Secondary | ICD-10-CM | POA: Diagnosis not present

## 2023-05-18 NOTE — Progress Notes (Signed)
Remote pacemaker transmission.   

## 2023-06-01 ENCOUNTER — Ambulatory Visit (INDEPENDENT_AMBULATORY_CARE_PROVIDER_SITE_OTHER): Payer: Medicare PPO | Admitting: Urology

## 2023-06-01 VITALS — BP 118/70 | HR 71

## 2023-06-01 DIAGNOSIS — N1330 Unspecified hydronephrosis: Secondary | ICD-10-CM | POA: Diagnosis not present

## 2023-06-01 DIAGNOSIS — N2889 Other specified disorders of kidney and ureter: Secondary | ICD-10-CM | POA: Diagnosis not present

## 2023-06-01 DIAGNOSIS — R93421 Abnormal radiologic findings on diagnostic imaging of right kidney: Secondary | ICD-10-CM

## 2023-06-01 DIAGNOSIS — N200 Calculus of kidney: Secondary | ICD-10-CM

## 2023-06-01 NOTE — Progress Notes (Unsigned)
06/01/2023 12:57 PM   Dawn Gonzales [facility] 01/04/1941 [id number]  Referring provider: Benita Stabile, Gonzales [address] Dawn Gonzales,  [name]  Dawn Gonzales   HPI: [name] is a 82yo here for evaluation of a right renal mass. She underwent an MRI which showed an exophytic 2.7cm renal mass which has increased in size from 1.9cm in 2021. MRI also shows right proximal hydronephrosis and a filling defect likely a renal calculus. No prior nephrolithiasis. She denies any flank pain. No hx of gross hematuria. 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   26 mm Edwards Sapien 3 transcatheter heart valve placed via percutaneous right transfemoral approach    Severe aortic stenosis    Sleep apnea     Surgical History: Past Surgical History:  Procedure Laterality Date   Abdominal gangrene     ADENOIDECTOMY     Cataract surgery     COLONOSCOPY     ENDOSCOPIC RETROGRADE CHOLANGIOPANCREATOGRAPHY (ERCP) WITH PROPOFOL N/A 02/08/2018   Procedure: ENDOSCOPIC RETROGRADE CHOLANGIOPANCREATOGRAPHY (ERCP) WITH PROPOFOL;  Surgeon: Dawn Gonzales;  Location: [facility] ENDOSCOPY;  Service: Gastroenterology;  Laterality: N/A;   EUS  02/08/2018   Procedure: UPPER ENDOSCOPIC ULTRASOUND (EUS) LINEAR;  Surgeon: Dawn Gonzales., Gonzales;  Location: [facility] ENDOSCOPY;  Service: Gastroenterology;;   EYE SURGERY     IR FLUORO RM 30-60 MIN  03/27/2018   IR PERC CHOLECYSTOSTOMY  02/09/2018   IR RADIOLOGIST EVAL & MGMT  03/26/2018   IR RADIOLOGY PERIPHERAL  GUIDED IV START  05/22/2018   IR RADIOLOGY PERIPHERAL GUIDED IV START  05/30/2018   IR RADIOLOGY PERIPHERAL GUIDED IV START  06/05/2018   IR US GUIDE VASC ACCESS RIGHT  05/22/2018   IR US GUIDE VASC ACCESS RIGHT  05/30/2018   IR US GUIDE VASC ACCESS RIGHT  06/05/2018   PACEMAKER IMPLANT N/A 07/10/2018   Procedure: PACEMAKER IMPLANT;  Surgeon: Dawn Maw, Gonzales;  Location: [facility] INVASIVE CV LAB;  Service: Cardiovascular;  Laterality: N/A;   REMOVAL OF STONES  02/08/2018   Procedure: REMOVAL OF STONES;  Surgeon: Dawn Gonzales., Gonzales;  Location: [facility] ENDOSCOPY;  Service: Gastroenterology;;   Dawn Gonzales  02/08/2018   Procedure: Dawn Gonzales;  Surgeon: Dawn Gonzales, Netty Gonzales., Gonzales;  Location: [facility] ENDOSCOPY;  Service: Gastroenterology;;   TONSILLECTOMY     TOOTH EXTRACTION N/A 06/18/2018   Procedure: DENTAL RESTORATION/EXTRACTIONS;  Surgeon: Dawn Gonzales, DDS;  Location: [facility] OR;  Service: Oral Surgery;  Laterality: N/A;   TRANSCATHETER AORTIC VALVE REPLACEMENT, TRANSFEMORAL N/A 07/09/2018   Procedure: TRANSCATHETER AORTIC VALVE REPLACEMENT, TRANSFEMORAL;  Surgeon: Dawn Bollman, Gonzales;  Location: [facility] INVASIVE CV LAB;  Service: Cardiovascular;  Laterality: N/A;   Dawn Gonzales      Home Medications:  Allergies as of 06/01/2023   No Known Allergies      Medication List        Accurate as of June 01, 2023 12:57 PM. If you have  any questions, ask your nurse or doctor.          acetaminophen 325 MG tablet Commonly known as: TYLENOL Take 2 tablets (650 mg total) by mouth every 6 (six) hours as needed for mild pain, fever or headache (or Fever >/= 101).   albuterol 108 (90 Base) MCG/ACT inhaler Commonly known as: VENTOLIN HFA Inhale 2 puffs into the lungs every 6 (six) hours as needed.   ascorbic acid 500 MG tablet Commonly known as: VITAMIN C Take 500 mg by mouth daily.   aspirin EC 81 MG tablet Take 1 tablet (81 mg total) by mouth daily with breakfast.   CALCIUM-CARB 600 PO Take  600 mg by mouth daily.   cholecalciferol 1000 units tablet Commonly known as: VITAMIN D Take 1,000 Units by mouth daily.   dextromethorphan-guaiFENesin 30-600 MG 12hr tablet Commonly known as: MUCINEX DM Take 1 tablet by mouth 2 (two) times daily. What changed:  when to take this reasons to take this   diclofenac Sodium 1 % Gel Commonly known as: VOLTAREN Apply 2 g topically 4 (four) times daily.   ferrous sulfate 325 (65 FE) MG tablet Take 325 mg by mouth daily with breakfast.   gabapentin 300 MG capsule Commonly known as: NEURONTIN Take 300 mg by mouth 3 (three) times daily.   ipratropium-albuterol 0.5-2.5 (3) MG/3ML Soln Commonly known as: DUONEB Take 3 mLs by nebulization 2 (two) times daily.   multivitamin with minerals Tabs tablet Take 1 tablet by mouth daily.   nystatin powder Commonly known as: MYCOSTATIN/NYSTOP APPLY TO AFFECTED AREA TWICE A DAY   OXYGEN Inhale 3 L into the lungs. With long tube mostly 1-1.5   polyethylene glycol 17 g packet Commonly known as: MiraLax Mix-In Pax Take 17 g by mouth daily.   potassium chloride 10 MEQ tablet Commonly known as: KLOR-CON Take 1 tablet (10 mEq total) by mouth daily. Take While taking Demadex/Torsemide   pravastatin 20 MG tablet Commonly known as: PRAVACHOL Take 20 mg by mouth daily.   silver sulfADIAZINE 1 % cream Commonly known as: SILVADENE Apply 1 Application topically daily as needed.   torsemide 20 MG tablet Commonly known as: DEMADEX Take 2 tablets (40 mg total) by mouth 2 (two) times daily.   venlafaxine 37.5 MG tablet Commonly known as: EFFEXOR Take 37.5 mg by mouth daily.        Allergies: No Known Allergies  Family History: Family History  Problem Relation Age of Onset   Cancer Mother    Hypertension Father    Arthritis Father    Heart attack Father     Social History:  reports that she quit smoking about 45 years ago. Her smoking use included cigarettes. She has never been  exposed to tobacco smoke. She has never used smokeless tobacco. She reports that she does not currently use alcohol. She reports that she does not use drugs.  ROS: All other review of systems were reviewed and are negative except what is noted above in HPI  Physical Exam: BP 118/70   Pulse 71   Constitutional:  Alert and oriented, No acute distress. HEENT: [place] AT, moist mucus membranes.  Trachea midline, no masses. Cardiovascular: No clubbing, cyanosis, or edema. Respiratory: Normal respiratory effort, no increased work of breathing. GI: Abdomen is soft, nontender, nondistended, no abdominal masses GU: No CVA tenderness.  Lymph: No cervical or inguinal lymphadenopathy. Skin: No rashes, bruises or suspicious lesions. Neurologic: Grossly intact, no focal deficits, moving all 4 extremities. Psychiatric: Normal  mood and affect.  Laboratory Data: Lab Results  Component Value Date   WBC 8.0 05/12/2022   HGB 11.7 (L) 05/12/2022   HCT 38.2 05/12/2022   MCV 93.2 05/12/2022   PLT 218 05/12/2022    Lab Results  Component Value Date   CREATININE 0.97 05/12/2022    No results found for: "PSA"  No results found for: "TESTOSTERONE"  Lab Results  Component Value Date   HGBA1C 5.5 07/05/2018    Urinalysis    Component Value Date/Time   COLORURINE STRAW (A) 04/05/2022 1051   APPEARANCEUR CLEAR 04/05/2022 1051   LABSPEC 1.005 04/05/2022 1051   PHURINE 7.0 04/05/2022 1051   GLUCOSEU NEGATIVE 04/05/2022 1051   HGBUR NEGATIVE 04/05/2022 1051   BILIRUBINUR NEGATIVE 04/05/2022 1051   KETONESUR NEGATIVE 04/05/2022 1051   PROTEINUR NEGATIVE 04/05/2022 1051   NITRITE NEGATIVE 04/05/2022 1051   LEUKOCYTESUR NEGATIVE 04/05/2022 1051    Lab Results  Component Value Date   BACTERIA RARE (A) 08/20/2019    Pertinent Imaging: MRI 04/30/23: Images reviewed and discussed with the patient  No results found for this or any previous visit.  Results for orders placed during the hospital  encounter of 07/21/21  US Venous Img Lower Bilateral (DVT)  Narrative CLINICAL DATA:  Swelling  EXAM: BILATERAL LOWER EXTREMITY VENOUS DOPPLER ULTRASOUND  TECHNIQUE: Gray-scale sonography with compression, as well as color and duplex ultrasound, were performed to evaluate the deep venous system(s) from the level of the common femoral vein through the popliteal and proximal calf veins. Technologist describes technically difficult study secondary to calf edema.  COMPARISON:  None.  FINDINGS: VENOUS  Normal compressibility of the common femoral, superficial femoral, and popliteal veins, as well as the visualized calf veins. Visualized portions of profunda femoral vein and great saphenous vein unremarkable. No filling defects to suggest DVT on grayscale or color Doppler imaging. Doppler waveforms show normal direction of venous flow, normal respiratory plasticity and response to augmentation.  OTHER  None.  Limitations: none  IMPRESSION: Negative.   Electronically Signed By: Corlis Leak M.D. On: 07/21/2021 16:08  No results found for this or any previous visit.  No results found for this or any previous visit.  Results for orders placed during the hospital encounter of 08/20/19  US RENAL  Narrative CLINICAL DATA:  Acute kidney injury.  EXAM: RENAL / URINARY TRACT ULTRASOUND COMPLETE  COMPARISON:  None.  FINDINGS: Right Kidney:  Renal measurements: 9.1 x 5.8 x 5.2 cm = volume: 144 mL . Echogenicity within normal limits. No mass or hydronephrosis visualized.  Left Kidney:  Not visualized.  Bladder:  Not seen, presumably decompressed.  Other:  None.  IMPRESSION: 1. Visualized portion of the RIGHT kidney appears normal. Earlier CT abdomen of 08/16/2019 described a solid/cystic appearing lesion arising from the lower pole of the RIGHT kidney. This mass is not able to be visualized by ultrasound, presumably obscured by overlying bowel gas.  Consider MRI for definitive characterization. When the patient is clinically stable and able to follow directions and hold their breath (preferably as an outpatient) further evaluation with dedicated abdominal MRI should be considered. 2. LEFT kidney is not able to be visualized by ultrasound, presumably obscured by overlying bowel gas. 3. No acute appearing renal findings.  No hydronephrosis. 4. Bladder is not seen, presumably decompressed.   Electronically Signed By: Bary Richard M.D. On: 08/21/2019 14:25  No results found for this or any previous visit.  No results found for this or any previous  visit.  No results found for this or any previous visit.   Assessment & Plan:    1. Renal mass (Primary) We discussed the natural hx of renal masses and the 80/20 malignant/benign likelihood. We disucssed the treatment options including active surveillance. Renal ablation, partial and radical nephrectomy. After discussing the options the patient and daughter elect for surveillance.  2. Nephrolithiasis -CT stone study  No follow-ups on file.  Wilkie Aye, Gonzales  [facility]

## 2023-06-05 ENCOUNTER — Encounter: Payer: Self-pay | Admitting: Urology

## 2023-06-05 NOTE — Patient Instructions (Signed)
 A Growth in the Kidney (Renal Mass): What to Know  A renal mass is an abnormal growth in the kidney. It may be found during an MRI, CT scan, or ultrasound that's done to check for other problems in the belly. Some renal masses are cancerous, or malignant, and can grow or spread quickly. Others are benign, which means they're not cancer. Renal masses include: Tumors. These may be either cancerous or benign. The most common cancerous tumor in adults is called renal cell carcinoma. In children, the most common type is Wilms tumor. The most common kidney tumors that aren't cancer include renal adenomas, oncocytomas, and angiomyolipomas (AML). Cysts. These are pockets of fluid that form on or in the kidney. What are the causes? Certain types of cancers, infections, or injuries can cause a renal mass. It's not always known what causes a cyst to form in or on the kidney. What are the signs or symptoms? Often, a renal mass or kidney cyst doesn't cause any signs or symptoms. How is this diagnosed? Your health care provider may suggest tests to diagnose the cause of your renal mass. These tests may include: A physical exam. Blood tests. Pee (urine) tests. Imaging tests, such as: CT scan. MRI. Ultrasound. Chest X-ray or bone scan. These may be done if a tumor is cancerous to see if the cancer has spread outside the kidney. Biopsy. This is when a small piece of tissue is removed from the renal mass for testing. How is this treated? Treatment is not always needed for a renal mass. Treatment will depend on the cause of the mass and if it's causing any problems or symptoms. For a cancerous renal mass, treatment options may include: Surgery. This is done to remove the tumor and any affected tissue. Chemotherapy. This uses medicines to kill cancer cells. Radiation. High-energy X-rays or gamma rays are used to kill cancer cells. Ablation. This uses extreme hot or cold temperature to kill the cancer  cells. Immunotherapy. Medicines are used to help the body's defense system (immune system) fight the cancer cells. Taking part in clinical trials. This involves trying new or experimental treatments to see if they're effective. Most kidney cysts don't need to be treated. Follow these instructions at home: What you need to do at home will depend on the cause of the mass. Follow the instructions that your provider gives you. In general: Take medicines only as told. If you were given antibiotics, take them as told. Do not stop taking them even if you start to feel better. Follow any instructions from your provider about what you can and can't do. Do not smoke, vape, or use nicotine or tobacco. Keep all follow-up visits. Your provider will need to check if your renal mass has changed or grown. Contact a health care provider if: You have flank pain, which is pain in your side or back. You have a fever. You have a loss of appetite. You have pain or swelling in your belly. You lose weight. Get help right away if: Your pain gets worse. There's blood in your pee. You can't pee. This information is not intended to replace advice given to you by your health care provider. Make sure you discuss any questions you have with your health care provider. Document Revised: 10/13/2022 Document Reviewed: 10/13/2022 Elsevier Patient Education  2024 [employer].

## 2023-06-06 DIAGNOSIS — G44009 Cluster headache syndrome, unspecified, not intractable: Secondary | ICD-10-CM | POA: Diagnosis not present

## 2023-06-06 DIAGNOSIS — G4733 Obstructive sleep apnea (adult) (pediatric): Secondary | ICD-10-CM | POA: Diagnosis not present

## 2023-06-09 DIAGNOSIS — I509 Heart failure, unspecified: Secondary | ICD-10-CM | POA: Diagnosis not present

## 2023-06-09 DIAGNOSIS — M1990 Unspecified osteoarthritis, unspecified site: Secondary | ICD-10-CM | POA: Diagnosis not present

## 2023-06-09 DIAGNOSIS — Z9181 History of falling: Secondary | ICD-10-CM | POA: Diagnosis not present

## 2023-06-09 DIAGNOSIS — J9611 Chronic respiratory failure with hypoxia: Secondary | ICD-10-CM | POA: Diagnosis not present

## 2023-06-09 DIAGNOSIS — I5032 Chronic diastolic (congestive) heart failure: Secondary | ICD-10-CM | POA: Diagnosis not present

## 2023-06-09 DIAGNOSIS — I890 Lymphedema, not elsewhere classified: Secondary | ICD-10-CM | POA: Diagnosis not present

## 2023-06-09 DIAGNOSIS — G629 Polyneuropathy, unspecified: Secondary | ICD-10-CM | POA: Diagnosis not present

## 2023-06-09 DIAGNOSIS — I110 Hypertensive heart disease with heart failure: Secondary | ICD-10-CM | POA: Diagnosis not present

## 2023-06-11 ENCOUNTER — Ambulatory Visit (HOSPITAL_COMMUNITY)
Admission: RE | Admit: 2023-06-11 | Discharge: 2023-06-11 | Disposition: A | Discharge status: Home / Self Care | Payer: Medicare PPO | Source: Ambulatory Visit | Attending: Urology | Admitting: Urology

## 2023-06-11 DIAGNOSIS — N200 Calculus of kidney: Secondary | ICD-10-CM | POA: Insufficient documentation

## 2023-06-20 ENCOUNTER — Ambulatory Visit (INDEPENDENT_AMBULATORY_CARE_PROVIDER_SITE_OTHER): Payer: Medicare PPO | Admitting: Urology

## 2023-06-20 VITALS — BP 153/64 | HR 80

## 2023-06-20 DIAGNOSIS — N3281 Overactive bladder: Secondary | ICD-10-CM

## 2023-06-20 DIAGNOSIS — N2889 Other specified disorders of kidney and ureter: Secondary | ICD-10-CM | POA: Diagnosis not present

## 2023-06-20 MED ORDER — GEMTESA 75 MG PO TABS: 1.0000 | ORAL_TABLET | Freq: Every day | ORAL | 11 refills | Status: AC

## 2023-06-20 NOTE — Progress Notes (Signed)
 06/20/2023 10:12 AM   Dawn Gonzales [birth year] [id number]  Referring provider: Benita Stabile, MD [address] Dawn [place],  [place] [address]  Followup renal mass   HPI: Dawn Gonzales is a 82yo here for followup fro a renal mass and OAB. Ct stone study showed extrarenal pelvis and no hydronephrosis. No calculi visualized on CT stone study. Right renal mass could not be seen on the CT. She has bothersome urinary urgency, frequency and incontinence. She has not tried any OAb therapy.     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   26 mm Edwards Sapien 3 transcatheter heart valve placed via percutaneous right transfemoral approach    Severe aortic stenosis    Sleep apnea     Surgical History: Past Surgical History:  Procedure Laterality Date   Abdominal gangrene     ADENOIDECTOMY     Cataract surgery     COLONOSCOPY     ENDOSCOPIC RETROGRADE CHOLANGIOPANCREATOGRAPHY (ERCP) WITH PROPOFOL N/A 02/08/2018   Procedure: ENDOSCOPIC RETROGRADE CHOLANGIOPANCREATOGRAPHY (ERCP) WITH PROPOFOL;  Surgeon: Lemar Lofty., MD;  Location: [facility] ENDOSCOPY;  Service: Gastroenterology;  Laterality: N/A;   EUS  02/08/2018   Procedure: UPPER ENDOSCOPIC ULTRASOUND (EUS) LINEAR;  Surgeon: Meridee Score Netty Starring., MD;  Location: [facility] ENDOSCOPY;  Service: Gastroenterology;;   EYE SURGERY     IR FLUORO RM 30-60 MIN  03/27/2018   IR PERC CHOLECYSTOSTOMY  02/09/2018   IR RADIOLOGIST EVAL & MGMT  03/26/2018   IR RADIOLOGY PERIPHERAL GUIDED IV START  05/22/2018   IR RADIOLOGY  PERIPHERAL GUIDED IV START  05/30/2018   IR RADIOLOGY PERIPHERAL GUIDED IV START  06/05/2018   IR US GUIDE VASC ACCESS RIGHT  05/22/2018   IR US GUIDE VASC ACCESS RIGHT  05/30/2018   IR US GUIDE VASC ACCESS RIGHT  06/05/2018   PACEMAKER IMPLANT N/A 07/10/2018   Procedure: PACEMAKER IMPLANT;  Surgeon: Marinus Maw, MD;  Location: [facility] INVASIVE CV LAB;  Service: Cardiovascular;  Laterality: N/A;   REMOVAL OF STONES  02/08/2018   Procedure: REMOVAL OF STONES;  Surgeon: Meridee Score Netty Starring., MD;  Location: [facility] ENDOSCOPY;  Service: Gastroenterology;;   Dennison Mascot  02/08/2018   Procedure: Dennison Mascot;  Surgeon: Mansouraty, Netty Starring., MD;  Location: [facility] ENDOSCOPY;  Service: Gastroenterology;;   TONSILLECTOMY     TOOTH EXTRACTION N/A 06/18/2018   Procedure: DENTAL RESTORATION/EXTRACTIONS;  Surgeon: Ocie Doyne, DDS;  Location: [facility] OR;  Service: Oral Surgery;  Laterality: N/A;   TRANSCATHETER AORTIC VALVE REPLACEMENT, TRANSFEMORAL N/A 07/09/2018   Procedure: TRANSCATHETER AORTIC VALVE REPLACEMENT, TRANSFEMORAL;  Surgeon: Tonny Bollman, MD;  Location: [facility] INVASIVE CV LAB;  Service: Cardiovascular;  Laterality: N/A;   Tummy tuck      Home Medications:  Allergies as of 06/20/2023   No Known Allergies      Medication List        Accurate as of June 20, 2023 10:12 AM. If you have any questions, ask your nurse or  doctor.          acetaminophen 325 MG tablet Commonly known as: TYLENOL Take 2 tablets (650 mg total) by mouth every 6 (six) hours as needed for mild pain, fever or headache (or Fever >/= 101).   albuterol 108 (90 Base) MCG/ACT inhaler Commonly known as: VENTOLIN HFA Inhale 2 puffs into the lungs every 6 (six) hours as needed.   ascorbic acid 500 MG tablet Commonly known as: VITAMIN C Take 500 mg by mouth daily.   aspirin EC 81 MG tablet Take 1 tablet (81 mg total) by mouth daily with breakfast.   CALCIUM-CARB 600 PO Take 600 mg by mouth daily.   cholecalciferol  1000 units tablet Commonly known as: VITAMIN D Take 1,000 Units by mouth daily.   dextromethorphan-guaiFENesin 30-600 MG 12hr tablet Commonly known as: MUCINEX DM Take 1 tablet by mouth 2 (two) times daily. What changed:  when to take this reasons to take this   diclofenac Sodium 1 % Gel Commonly known as: VOLTAREN Apply 2 g topically 4 (four) times daily.   ferrous sulfate 325 (65 FE) MG tablet Take 325 mg by mouth daily with breakfast.   gabapentin 300 MG capsule Commonly known as: NEURONTIN Take 300 mg by mouth 3 (three) times daily.   ipratropium-albuterol 0.5-2.5 (3) MG/3ML Soln Commonly known as: DUONEB Take 3 mLs by nebulization 2 (two) times daily.   multivitamin with minerals Tabs tablet Take 1 tablet by mouth daily.   nystatin powder Commonly known as: MYCOSTATIN/NYSTOP APPLY TO AFFECTED AREA TWICE A DAY   OXYGEN Inhale 3 L into the lungs. With long tube mostly 1-1.5   polyethylene glycol 17 g packet Commonly known as: MiraLax Mix-In Pax Take 17 g by mouth daily.   potassium chloride 10 MEQ tablet Commonly known as: KLOR-CON Take 1 tablet (10 mEq total) by mouth daily. Take While taking Demadex/Torsemide   pravastatin 20 MG tablet Commonly known as: PRAVACHOL Take 20 mg by mouth daily.   silver sulfADIAZINE 1 % cream Commonly known as: SILVADENE Apply 1 Application topically daily as needed.   torsemide 20 MG tablet Commonly known as: DEMADEX Take 2 tablets (40 mg total) by mouth 2 (two) times daily.   venlafaxine 37.5 MG tablet Commonly known as: EFFEXOR Take 37.5 mg by mouth daily.        Allergies: No Known Allergies  Family History: Family History  Problem Relation Age of Onset   Cancer Mother    Hypertension Father    Arthritis Father    Heart attack Father     Social History:  reports that she quit smoking about 45 years ago. Her smoking use included cigarettes. She has never been exposed to tobacco smoke. She has never used  smokeless tobacco. She reports that she does not currently use alcohol. She reports that she does not use drugs.  ROS: All other review of systems were reviewed and are negative except what is noted above in HPI  Physical Exam: BP (!) 153/64   Pulse 80   Constitutional:  Alert and oriented, No acute distress. HEENT: [place] AT, moist mucus membranes.  Trachea midline, no masses. Cardiovascular: No clubbing, cyanosis, or edema. Respiratory: Normal respiratory effort, no increased work of breathing. GI: Abdomen is soft, nontender, nondistended, no abdominal masses GU: No CVA tenderness.  Lymph: No cervical or inguinal lymphadenopathy. Skin: No rashes, bruises or suspicious lesions. Neurologic: Grossly intact, no focal deficits, moving all 4 extremities. Psychiatric: Normal mood and affect.  Laboratory  Data: Lab Results  Component Value Date   WBC 8.0 05/12/2022   HGB 11.7 (L) 05/12/2022   HCT 38.2 05/12/2022   MCV 93.2 05/12/2022   PLT 218 05/12/2022    Lab Results  Component Value Date   CREATININE 0.97 05/12/2022    No results found for: "PSA"  No results found for: "TESTOSTERONE"  Lab Results  Component Value Date   HGBA1C 5.5 07/05/2018    Urinalysis    Component Value Date/Time   COLORURINE STRAW (A) 04/05/2022 1051   APPEARANCEUR CLEAR 04/05/2022 1051   LABSPEC 1.005 04/05/2022 1051   PHURINE 7.0 04/05/2022 1051   GLUCOSEU NEGATIVE 04/05/2022 1051   HGBUR NEGATIVE 04/05/2022 1051   BILIRUBINUR NEGATIVE 04/05/2022 1051   KETONESUR NEGATIVE 04/05/2022 1051   PROTEINUR NEGATIVE 04/05/2022 1051   NITRITE NEGATIVE 04/05/2022 1051   LEUKOCYTESUR NEGATIVE 04/05/2022 1051    Lab Results  Component Value Date   BACTERIA RARE (A) 08/20/2019    Pertinent Imaging: CT stone study: Images reviewed and discussed with the patient  No results found for this or any previous visit.  Results for orders placed during the hospital encounter of 07/21/21  US Venous Img  Lower Bilateral (DVT)  Narrative CLINICAL DATA:  Swelling  EXAM: BILATERAL LOWER EXTREMITY VENOUS DOPPLER ULTRASOUND  TECHNIQUE: Gray-scale sonography with compression, as well as color and duplex ultrasound, were performed to evaluate the deep venous system(s) from the level of the common femoral vein through the popliteal and proximal calf veins. Technologist describes technically difficult study secondary to calf edema.  COMPARISON:  None.  FINDINGS: VENOUS  Normal compressibility of the common femoral, superficial femoral, and popliteal veins, as well as the visualized calf veins. Visualized portions of profunda femoral vein and great saphenous vein unremarkable. No filling defects to suggest DVT on grayscale or color Doppler imaging. Doppler waveforms show normal direction of venous flow, normal respiratory plasticity and response to augmentation.  OTHER  None.  Limitations: none  IMPRESSION: Negative.   Electronically Signed By: Corlis Leak M.D. On: 07/21/2021 16:08  No results found for this or any previous visit.  No results found for this or any previous visit.  Results for orders placed during the hospital encounter of 08/20/19  US RENAL  Narrative CLINICAL DATA:  Acute kidney injury.  EXAM: RENAL / URINARY TRACT ULTRASOUND COMPLETE  COMPARISON:  None.  FINDINGS: Right Kidney:  Renal measurements: 9.1 x 5.8 x 5.2 cm = volume: 144 mL . Echogenicity within normal limits. No mass or hydronephrosis visualized.  Left Kidney:  Not visualized.  Bladder:  Not seen, presumably decompressed.  Other:  None.  IMPRESSION: 1. Visualized portion of the RIGHT kidney appears normal. Earlier CT abdomen of 08/16/2019 described a solid/cystic appearing lesion arising from the lower pole of the RIGHT kidney. This mass is not able to be visualized by ultrasound, presumably obscured by overlying bowel gas. Consider MRI for definitive  characterization. When the patient is clinically stable and able to follow directions and hold their breath (preferably as an outpatient) further evaluation with dedicated abdominal MRI should be considered. 2. LEFT kidney is not able to be visualized by ultrasound, presumably obscured by overlying bowel gas. 3. No acute appearing renal findings.  No hydronephrosis. 4. Bladder is not seen, presumably decompressed.   Electronically Signed By: Bary Richard M.D. On: 08/21/2019 14:25  No results found for this or any previous visit.  No results found for this or any previous visit.  No results  found for this or any previous visit.   Assessment & Plan:    1. Renal mass (Primary) Followup 6 months with renal US   No follow-ups on file.  Wilkie Aye, MD  [facility] [place]

## 2023-06-26 ENCOUNTER — Encounter: Payer: Self-pay | Admitting: Urology

## 2023-06-26 NOTE — Patient Instructions (Signed)

## 2023-07-03 DIAGNOSIS — L89151 Pressure ulcer of sacral region, stage 1: Secondary | ICD-10-CM | POA: Diagnosis not present

## 2023-07-03 DIAGNOSIS — R000 Tachycardia, unspecified: Secondary | ICD-10-CM | POA: Diagnosis not present

## 2023-07-03 DIAGNOSIS — L89159 Pressure ulcer of sacral region, unspecified stage: Secondary | ICD-10-CM | POA: Diagnosis not present

## 2023-07-03 DIAGNOSIS — M545 Low back pain, unspecified: Secondary | ICD-10-CM | POA: Diagnosis not present

## 2023-07-04 DIAGNOSIS — R300 Dysuria: Secondary | ICD-10-CM | POA: Diagnosis not present

## 2023-07-04 DIAGNOSIS — G44009 Cluster headache syndrome, unspecified, not intractable: Secondary | ICD-10-CM | POA: Diagnosis not present

## 2023-07-04 DIAGNOSIS — G4733 Obstructive sleep apnea (adult) (pediatric): Secondary | ICD-10-CM | POA: Diagnosis not present

## 2023-07-07 DIAGNOSIS — I110 Hypertensive heart disease with heart failure: Secondary | ICD-10-CM | POA: Diagnosis not present

## 2023-07-07 DIAGNOSIS — I509 Heart failure, unspecified: Secondary | ICD-10-CM | POA: Diagnosis not present

## 2023-07-07 DIAGNOSIS — Z9181 History of falling: Secondary | ICD-10-CM | POA: Diagnosis not present

## 2023-07-07 DIAGNOSIS — G629 Polyneuropathy, unspecified: Secondary | ICD-10-CM | POA: Diagnosis not present

## 2023-07-07 DIAGNOSIS — M1990 Unspecified osteoarthritis, unspecified site: Secondary | ICD-10-CM | POA: Diagnosis not present

## 2023-07-07 DIAGNOSIS — I890 Lymphedema, not elsewhere classified: Secondary | ICD-10-CM | POA: Diagnosis not present

## 2023-07-07 DIAGNOSIS — J9611 Chronic respiratory failure with hypoxia: Secondary | ICD-10-CM | POA: Diagnosis not present

## 2023-07-07 DIAGNOSIS — I5032 Chronic diastolic (congestive) heart failure: Secondary | ICD-10-CM | POA: Diagnosis not present

## 2023-07-09 ENCOUNTER — Ambulatory Visit: Payer: Medicare PPO

## 2023-07-09 DIAGNOSIS — I442 Atrioventricular block, complete: Secondary | ICD-10-CM | POA: Diagnosis not present

## 2023-07-10 LAB — CUP PACEART REMOTE DEVICE CHECK
Date Time Interrogation Session: 20250310111643
Implantable Lead Connection Status: 753985
Implantable Lead Connection Status: 753985
Implantable Lead Implant Date: 20200311
Implantable Lead Implant Date: 20200311
Implantable Lead Location: 753859
Implantable Lead Location: 753860
Implantable Lead Model: 377
Implantable Lead Model: 377
Implantable Lead Serial Number: 81024123
Implantable Lead Serial Number: 81075341
Implantable Pulse Generator Implant Date: 20200311
Pulse Gen Model: 407145
Pulse Gen Serial Number: 69550148

## 2023-07-11 ENCOUNTER — Encounter: Payer: Self-pay | Admitting: Internal Medicine

## 2023-07-21 ENCOUNTER — Emergency Department (HOSPITAL_COMMUNITY)

## 2023-07-21 ENCOUNTER — Inpatient Hospital Stay (HOSPITAL_COMMUNITY)
Admission: EM | Admit: 2023-07-21 | Discharge: 2023-07-24 | DRG: 602 | Disposition: A | Discharge status: Home Health | Attending: Internal Medicine | Admitting: Internal Medicine

## 2023-07-21 ENCOUNTER — Encounter (HOSPITAL_COMMUNITY): Payer: Self-pay

## 2023-07-21 ENCOUNTER — Other Ambulatory Visit: Payer: Self-pay

## 2023-07-21 DIAGNOSIS — I5032 Chronic diastolic (congestive) heart failure: Secondary | ICD-10-CM | POA: Diagnosis present

## 2023-07-21 DIAGNOSIS — L03119 Cellulitis of unspecified part of limb: Secondary | ICD-10-CM | POA: Diagnosis not present

## 2023-07-21 DIAGNOSIS — Z87891 Personal history of nicotine dependence: Secondary | ICD-10-CM

## 2023-07-21 DIAGNOSIS — I4510 Unspecified right bundle-branch block: Secondary | ICD-10-CM | POA: Diagnosis present

## 2023-07-21 DIAGNOSIS — G4733 Obstructive sleep apnea (adult) (pediatric): Secondary | ICD-10-CM | POA: Diagnosis present

## 2023-07-21 DIAGNOSIS — I2510 Atherosclerotic heart disease of native coronary artery without angina pectoris: Secondary | ICD-10-CM | POA: Diagnosis not present

## 2023-07-21 DIAGNOSIS — L03115 Cellulitis of right lower limb: Principal | ICD-10-CM | POA: Diagnosis present

## 2023-07-21 DIAGNOSIS — Z950 Presence of cardiac pacemaker: Secondary | ICD-10-CM

## 2023-07-21 DIAGNOSIS — Z7982 Long term (current) use of aspirin: Secondary | ICD-10-CM | POA: Diagnosis not present

## 2023-07-21 DIAGNOSIS — Z79899 Other long term (current) drug therapy: Secondary | ICD-10-CM

## 2023-07-21 DIAGNOSIS — Z953 Presence of xenogenic heart valve: Secondary | ICD-10-CM | POA: Diagnosis not present

## 2023-07-21 DIAGNOSIS — R000 Tachycardia, unspecified: Secondary | ICD-10-CM | POA: Diagnosis present

## 2023-07-21 DIAGNOSIS — R0609 Other forms of dyspnea: Secondary | ICD-10-CM | POA: Diagnosis not present

## 2023-07-21 DIAGNOSIS — L03116 Cellulitis of left lower limb: Secondary | ICD-10-CM | POA: Diagnosis present

## 2023-07-21 DIAGNOSIS — R531 Weakness: Secondary | ICD-10-CM | POA: Diagnosis not present

## 2023-07-21 DIAGNOSIS — J9611 Chronic respiratory failure with hypoxia: Secondary | ICD-10-CM | POA: Diagnosis present

## 2023-07-21 DIAGNOSIS — Z8249 Family history of ischemic heart disease and other diseases of the circulatory system: Secondary | ICD-10-CM

## 2023-07-21 DIAGNOSIS — Z952 Presence of prosthetic heart valve: Secondary | ICD-10-CM

## 2023-07-21 DIAGNOSIS — E669 Obesity, unspecified: Secondary | ICD-10-CM | POA: Diagnosis present

## 2023-07-21 DIAGNOSIS — E782 Mixed hyperlipidemia: Secondary | ICD-10-CM | POA: Diagnosis present

## 2023-07-21 DIAGNOSIS — I110 Hypertensive heart disease with heart failure: Secondary | ICD-10-CM | POA: Diagnosis present

## 2023-07-21 DIAGNOSIS — I4719 Other supraventricular tachycardia: Secondary | ICD-10-CM | POA: Diagnosis present

## 2023-07-21 DIAGNOSIS — E860 Dehydration: Principal | ICD-10-CM | POA: Diagnosis present

## 2023-07-21 DIAGNOSIS — Z9981 Dependence on supplemental oxygen: Secondary | ICD-10-CM | POA: Diagnosis not present

## 2023-07-21 DIAGNOSIS — L89313 Pressure ulcer of right buttock, stage 3: Secondary | ICD-10-CM | POA: Diagnosis present

## 2023-07-21 DIAGNOSIS — Z6841 Body Mass Index (BMI) 40.0 and over, adult: Secondary | ICD-10-CM | POA: Diagnosis not present

## 2023-07-21 DIAGNOSIS — I080 Rheumatic disorders of both mitral and aortic valves: Secondary | ICD-10-CM | POA: Diagnosis present

## 2023-07-21 DIAGNOSIS — Z1152 Encounter for screening for COVID-19: Secondary | ICD-10-CM | POA: Diagnosis not present

## 2023-07-21 DIAGNOSIS — J668 Airway disease due to other specific organic dusts: Secondary | ICD-10-CM | POA: Diagnosis not present

## 2023-07-21 DIAGNOSIS — J9811 Atelectasis: Secondary | ICD-10-CM | POA: Diagnosis present

## 2023-07-21 DIAGNOSIS — Z8673 Personal history of transient ischemic attack (TIA), and cerebral infarction without residual deficits: Secondary | ICD-10-CM

## 2023-07-21 DIAGNOSIS — I442 Atrioventricular block, complete: Secondary | ICD-10-CM | POA: Diagnosis not present

## 2023-07-21 DIAGNOSIS — R0989 Other specified symptoms and signs involving the circulatory and respiratory systems: Secondary | ICD-10-CM | POA: Diagnosis not present

## 2023-07-21 DIAGNOSIS — J9612 Chronic respiratory failure with hypercapnia: Secondary | ICD-10-CM | POA: Diagnosis present

## 2023-07-21 DIAGNOSIS — J984 Other disorders of lung: Secondary | ICD-10-CM

## 2023-07-21 DIAGNOSIS — I342 Nonrheumatic mitral (valve) stenosis: Secondary | ICD-10-CM | POA: Diagnosis not present

## 2023-07-21 DIAGNOSIS — R918 Other nonspecific abnormal finding of lung field: Secondary | ICD-10-CM | POA: Diagnosis not present

## 2023-07-21 DIAGNOSIS — R5383 Other fatigue: Secondary | ICD-10-CM

## 2023-07-21 DIAGNOSIS — A419 Sepsis, unspecified organism: Secondary | ICD-10-CM | POA: Diagnosis not present

## 2023-07-21 LAB — COMPREHENSIVE METABOLIC PANEL
ALT: 19 U/L (ref 0–44)
AST: 23 U/L (ref 15–41)
Albumin: 4.1 g/dL (ref 3.5–5.0)
Alkaline Phosphatase: 72 U/L (ref 38–126)
Anion gap: 12 (ref 5–15)
BUN: 52 mg/dL — ABNORMAL HIGH (ref 8–23)
CO2: 36 mmol/L — ABNORMAL HIGH (ref 22–32)
Calcium: 9.8 mg/dL (ref 8.9–10.3)
Chloride: 92 mmol/L — ABNORMAL LOW (ref 98–111)
Creatinine, Ser: 0.96 mg/dL (ref 0.44–1.00)
GFR, Estimated: 59 mL/min — ABNORMAL LOW (ref >60)
Glucose, Bld: 104 mg/dL — ABNORMAL HIGH (ref 70–99)
Potassium: 4 mmol/L (ref 3.5–5.1)
Sodium: 140 mmol/L (ref 135–145)
Total Bilirubin: 0.7 mg/dL (ref 0.0–1.2)
Total Protein: 8.1 g/dL (ref 6.5–8.1)

## 2023-07-21 LAB — URINALYSIS, W/ REFLEX TO CULTURE (INFECTION SUSPECTED)
Bacteria, UA: NONE SEEN
Bilirubin Urine: NEGATIVE
Glucose, UA: NEGATIVE mg/dL
Hgb urine dipstick: NEGATIVE
Ketones, ur: NEGATIVE mg/dL
Leukocytes,Ua: NEGATIVE
Nitrite: NEGATIVE
Protein, ur: NEGATIVE mg/dL
Specific Gravity, Urine: 1.009 (ref 1.005–1.030)
pH: 6 (ref 5.0–8.0)

## 2023-07-21 LAB — RESPIRATORY PANEL BY PCR

## 2023-07-21 LAB — CK: Total CK: 67 U/L (ref 38–234)

## 2023-07-21 LAB — CBC WITH DIFFERENTIAL/PLATELET
Abs Immature Granulocytes: 0.03 10*3/uL (ref 0.00–0.07)
Basophils Absolute: 0.1 10*3/uL (ref 0.0–0.1)
Basophils Relative: 1 %
Eosinophils Absolute: 0.3 10*3/uL (ref 0.0–0.5)
Eosinophils Relative: 3 %
HCT: 42.4 % (ref 36.0–46.0)
Hemoglobin: 13.4 g/dL (ref 12.0–15.0)
Immature Granulocytes: 0 %
Lymphocytes Relative: 23 %
Lymphs Abs: 2 10*3/uL (ref 0.7–4.0)
MCH: 29.8 pg (ref 26.0–34.0)
MCHC: 31.6 g/dL (ref 30.0–36.0)
MCV: 94.2 fL (ref 80.0–100.0)
Monocytes Absolute: 0.6 10*3/uL (ref 0.1–1.0)
Monocytes Relative: 7 %
Neutro Abs: 5.6 10*3/uL (ref 1.7–7.7)
Neutrophils Relative %: 66 %
Platelets: 287 10*3/uL (ref 150–400)
RBC: 4.5 MIL/uL (ref 3.87–5.11)
RDW: 13.8 % (ref 11.5–15.5)
WBC: 8.6 10*3/uL (ref 4.0–10.5)
nRBC: 0 % (ref 0.0–0.2)

## 2023-07-21 LAB — FOLATE: Folate: >40 ng/mL (ref >5.9)

## 2023-07-21 LAB — PROCALCITONIN: Procalcitonin: <0.1 ng/mL

## 2023-07-21 LAB — RESP PANEL BY RT-PCR (RSV, FLU A&B, COVID)  RVPGX2
Influenza A by PCR: NEGATIVE
Influenza B by PCR: NEGATIVE
Resp Syncytial Virus by PCR: NEGATIVE
SARS Coronavirus 2 by RT PCR: NEGATIVE

## 2023-07-21 LAB — PROTIME-INR
INR: 1 (ref 0.8–1.2)
Prothrombin Time: 13 s (ref 11.4–15.2)

## 2023-07-21 LAB — MAGNESIUM: Magnesium: 2.7 mg/dL — ABNORMAL HIGH (ref 1.7–2.4)

## 2023-07-21 LAB — TSH: TSH: 3.994 u[IU]/mL (ref 0.350–4.500)

## 2023-07-21 LAB — T4, FREE: Free T4: 0.96 ng/dL (ref 0.61–1.12)

## 2023-07-21 LAB — LACTIC ACID, PLASMA
Lactic Acid, Venous: 1 mmol/L (ref 0.5–1.9)
Lactic Acid, Venous: 1.1 mmol/L (ref 0.5–1.9)

## 2023-07-21 LAB — VITAMIN B12: Vitamin B-12: 420 pg/mL (ref 180–914)

## 2023-07-21 LAB — APTT: aPTT: 45 s — ABNORMAL HIGH (ref 24–36)

## 2023-07-21 MED ORDER — ENOXAPARIN SODIUM 40 MG/0.4ML IJ SOSY
40.0000 mg | PREFILLED_SYRINGE | INTRAMUSCULAR | Status: DC
  Administered 2023-07-21 – 2023-07-23 (×3): 40 mg via SUBCUTANEOUS
  Filled 2023-07-21 (×3): qty 0.4

## 2023-07-21 MED ORDER — SODIUM CHLORIDE 0.9 % IV SOLN
500.0000 mg | INTRAVENOUS | Status: DC
  Administered 2023-07-21: 500 mg via INTRAVENOUS
  Filled 2023-07-21: qty 5

## 2023-07-21 MED ORDER — IOHEXOL 350 MG/ML SOLN
75.0000 mL | Freq: Once | INTRAVENOUS | Status: AC | PRN
  Administered 2023-07-21: 75 mL via INTRAVENOUS

## 2023-07-21 MED ORDER — ONDANSETRON HCL 4 MG PO TABS: 4.0000 mg | ORAL_TABLET | Freq: Four times a day (QID) | ORAL | Status: DC | PRN

## 2023-07-21 MED ORDER — FERROUS SULFATE 325 (65 FE) MG PO TABS
325.0000 mg | ORAL_TABLET | Freq: Every day | ORAL | Status: DC
  Administered 2023-07-22 – 2023-07-24 (×3): 325 mg via ORAL
  Filled 2023-07-21 (×3): qty 1

## 2023-07-21 MED ORDER — VITAMIN D 25 MCG (1000 UNIT) PO TABS
1000.0000 [IU] | ORAL_TABLET | Freq: Every day | ORAL | Status: DC
  Administered 2023-07-22 – 2023-07-24 (×3): 1000 [IU] via ORAL
  Filled 2023-07-21 (×3): qty 1

## 2023-07-21 MED ORDER — SODIUM CHLORIDE 0.9 % IV SOLN
2.0000 g | Freq: Once | INTRAVENOUS | Status: AC
  Administered 2023-07-21: 2 g via INTRAVENOUS
  Filled 2023-07-21: qty 20

## 2023-07-21 MED ORDER — IPRATROPIUM-ALBUTEROL 0.5-2.5 (3) MG/3ML IN SOLN
3.0000 mL | Freq: Four times a day (QID) | RESPIRATORY_TRACT | Status: DC
  Administered 2023-07-21: 3 mL via RESPIRATORY_TRACT
  Filled 2023-07-21: qty 3

## 2023-07-21 MED ORDER — VITAMIN C 500 MG PO TABS
500.0000 mg | ORAL_TABLET | Freq: Every day | ORAL | Status: DC
  Administered 2023-07-22 – 2023-07-24 (×3): 500 mg via ORAL
  Filled 2023-07-21 (×3): qty 1

## 2023-07-21 MED ORDER — PRAVASTATIN SODIUM 40 MG PO TABS
20.0000 mg | ORAL_TABLET | Freq: Every day | ORAL | Status: DC
  Administered 2023-07-22 – 2023-07-24 (×3): 20 mg via ORAL
  Filled 2023-07-21 (×3): qty 1

## 2023-07-21 MED ORDER — ONDANSETRON HCL 4 MG/2ML IJ SOLN: 4.0000 mg | Freq: Four times a day (QID) | INTRAMUSCULAR | Status: DC | PRN

## 2023-07-21 MED ORDER — MIRABEGRON ER 25 MG PO TB24
25.0000 mg | ORAL_TABLET | Freq: Every day | ORAL | Status: DC
  Administered 2023-07-22 – 2023-07-24 (×3): 25 mg via ORAL
  Filled 2023-07-21 (×3): qty 1

## 2023-07-21 MED ORDER — VENLAFAXINE HCL 37.5 MG PO TABS
37.5000 mg | ORAL_TABLET | Freq: Every day | ORAL | Status: DC
  Administered 2023-07-22 – 2023-07-24 (×3): 37.5 mg via ORAL
  Filled 2023-07-21 (×3): qty 1

## 2023-07-21 MED ORDER — AZITHROMYCIN 250 MG PO TABS
500.0000 mg | ORAL_TABLET | Freq: Every day | ORAL | Status: DC
  Administered 2023-07-22 – 2023-07-23 (×2): 500 mg via ORAL
  Filled 2023-07-21 (×2): qty 2

## 2023-07-21 MED ORDER — GABAPENTIN 300 MG PO CAPS
300.0000 mg | ORAL_CAPSULE | Freq: Three times a day (TID) | ORAL | Status: DC
  Administered 2023-07-21 – 2023-07-24 (×8): 300 mg via ORAL
  Filled 2023-07-21 (×8): qty 1

## 2023-07-21 MED ORDER — ACETAMINOPHEN 325 MG PO TABS: 650.0000 mg | ORAL_TABLET | Freq: Four times a day (QID) | ORAL | Status: DC | PRN

## 2023-07-21 MED ORDER — IPRATROPIUM-ALBUTEROL 0.5-2.5 (3) MG/3ML IN SOLN
3.0000 mL | Freq: Three times a day (TID) | RESPIRATORY_TRACT | Status: DC
  Administered 2023-07-22 – 2023-07-23 (×4): 3 mL via RESPIRATORY_TRACT
  Filled 2023-07-21 (×4): qty 3

## 2023-07-21 MED ORDER — LACTATED RINGERS IV BOLUS
500.0000 mL | Freq: Once | INTRAVENOUS | Status: AC
  Administered 2023-07-21: 500 mL via INTRAVENOUS

## 2023-07-21 MED ORDER — ACETAMINOPHEN 650 MG RE SUPP: 650.0000 mg | Freq: Four times a day (QID) | RECTAL | Status: DC | PRN

## 2023-07-21 MED ORDER — IPRATROPIUM-ALBUTEROL 0.5-2.5 (3) MG/3ML IN SOLN: 3.0000 mL | RESPIRATORY_TRACT | Status: DC | PRN

## 2023-07-21 MED ORDER — SODIUM CHLORIDE 0.9 % IV SOLN
2.0000 g | INTRAVENOUS | Status: DC
  Administered 2023-07-22 – 2023-07-24 (×3): 2 g via INTRAVENOUS
  Filled 2023-07-21 (×3): qty 20

## 2023-07-21 MED ORDER — ASPIRIN 81 MG PO TBEC
81.0000 mg | DELAYED_RELEASE_TABLET | Freq: Every day | ORAL | Status: DC
  Administered 2023-07-22 – 2023-07-24 (×3): 81 mg via ORAL
  Filled 2023-07-21 (×3): qty 1

## 2023-07-21 MED ORDER — IPRATROPIUM-ALBUTEROL 0.5-2.5 (3) MG/3ML IN SOLN
3.0000 mL | Freq: Once | RESPIRATORY_TRACT | Status: AC
  Administered 2023-07-21: 3 mL via RESPIRATORY_TRACT
  Filled 2023-07-21: qty 3

## 2023-07-21 MED ORDER — BUDESONIDE 0.5 MG/2ML IN SUSP
0.5000 mg | Freq: Two times a day (BID) | RESPIRATORY_TRACT | Status: DC
  Administered 2023-07-21 – 2023-07-24 (×6): 0.5 mg via RESPIRATORY_TRACT
  Filled 2023-07-21 (×6): qty 2

## 2023-07-21 MED ORDER — LACTATED RINGERS IV SOLN: INTRAVENOUS | Status: AC

## 2023-07-21 MED ORDER — ADULT MULTIVITAMIN W/MINERALS CH
1.0000 | ORAL_TABLET | Freq: Every day | ORAL | Status: DC
  Administered 2023-07-22 – 2023-07-24 (×3): 1 via ORAL
  Filled 2023-07-21 (×3): qty 1

## 2023-07-21 NOTE — Progress Notes (Signed)
 Patient has a CPAP at home but currently not able to use and in service to fix.Paitent has declined use of CPAP and is currently wearing 3lpm . Rt will has a unit on stand by for patient when needed

## 2023-07-21 NOTE — Hospital Course (Signed)
 83 year old female with a history of hypertension, hyperlipidemia, aortic stenosis status post TAVR 06/2018,HFpEF, chronic respiratory failure on 4 L nasal cannula, OSA on CPAP, restrictive lung disease, complete heart block status post PPM, CVA presenting with generalized weakness and tachycardia. At baseline, the patient lives by herself.  She is able to perform ADLs with no assistance.  She ambulates with a walker. The patient went to see her PCP on 07/04/2023 secondary to tachycardia and some generalized fatigue.  Patient was initially placed on doxycycline for concerns of skin tear on her buttocks being possibly infected.  On 07/09/2023, her PCP office called back and placed the patient on Bactrim.  Patient finished a 10-day course of doxycycline 7-day course of Bactrim DS.  Her tachycardia improved back to baseline with heart rate in the 70s.  However, 3 days later the patient once again began developing some tachycardia with heart rate 110-120s.  Patient states that she has been feeling "sluggish" for the past 3 days.  She denies any headache, neck pain, fevers, chills, chest pain, nausea, vomiting or diarrhea, abdominal pain.  She denies any dysuria or hematuria.  She has had some dyspnea on exertion and nonproductive cough.  Besides the antibiotics described above, there is been no new medications.  Because of her generalized weakness and tachycardia, the patient was brought to emergency for further evaluation and treatment. In the ED, patient is afebrile and hemodynamically stable with oxygen saturation 93% on 3 L.  WBC 8.6, hemoglobin 13.4, platelets 287.  3140, potassium 4.0, bicarbonate 36, serum creatinine 0.96.  LFTs were unremarkable.  Lactic acid 1.0>> 1.1.  CTA chest was negative for PE.  There was mosaic parenchymal attenuation.  There is enlarged pulmonary trunk consistent with pulmonary hypertension.  There is chronic right lower lobe atelectasis.  The patient was given started ceftriaxone and  azithromycin.

## 2023-07-21 NOTE — ED Triage Notes (Signed)
 Pt bib daughter,states she had a UTI towards the beginning of the month and has been on 2 ABTs finished them about 5 days ago. Pts daughter is concerned the UTI maybe back.

## 2023-07-21 NOTE — Plan of Care (Signed)
  Problem: Clinical Measurements: Goal: Will remain free from infection Outcome: Progressing   Problem: Education: Goal: Knowledge of General Education information will improve Description: Including pain rating scale, medication(s)/side effects and non-pharmacologic comfort measures Outcome: Progressing   Problem: Clinical Measurements: Goal: Cardiovascular complication will be avoided Outcome: Progressing   Problem: Nutrition: Goal: Adequate nutrition will be maintained Outcome: Progressing

## 2023-07-21 NOTE — H&P (Signed)
 History and Physical    Patient: Dawn Gonzales WUJ:[id number] DOB: 04/07/1941 DOA: 07/21/2023 DOS: the patient was seen and examined on 07/21/2023 PCP: Benita Stabile, MD  Patient coming from: Home  Chief Complaint:  Chief Complaint  Patient presents with   Urinary Tract Infection   HPI: Dawn Gonzales is a 83 year old female with a history of hypertension, hyperlipidemia, aortic stenosis status post TAVR 06/2018,HFpEF, chronic respiratory failure on 4 L nasal cannula, OSA on CPAP, restrictive lung disease, complete heart block status post PPM, CVA presenting with generalized weakness and tachycardia. At baseline, the patient lives by herself.  She is able to perform ADLs with no assistance.  She ambulates with a walker. The patient went to see her PCP on 07/04/2023 secondary to tachycardia and some generalized fatigue.  Patient was initially placed on doxycycline for concerns of skin tear on her buttocks being possibly infected.  On 07/09/2023, her PCP office called back and placed the patient on Bactrim.  Patient finished a 10-day course of doxycycline 7-day course of Bactrim DS.  Her tachycardia improved back to baseline with heart rate in the 70s.  However, 3 days later the patient once again began developing some tachycardia with heart rate 110-120s.  Patient states that she has been feeling "sluggish" for the past 3 days.  She denies any headache, neck pain, fevers, chills, chest pain, nausea, vomiting or diarrhea, abdominal pain.  She denies any dysuria or hematuria.  She has had some dyspnea on exertion and nonproductive cough.  Besides the antibiotics described above, there is been no new medications.  Because of her generalized weakness and tachycardia, the patient was brought to emergency for further evaluation and treatment. In the ED, patient is afebrile and hemodynamically stable with oxygen saturation 93% on 3 L.  WBC 8.6, hemoglobin 13.4, platelets 287.  3140, potassium 4.0,  bicarbonate 36, serum creatinine 0.96.  LFTs were unremarkable.  Lactic acid 1.0>> 1.1.  CTA chest was negative for PE.  There was mosaic parenchymal attenuation.  There is enlarged pulmonary trunk consistent with pulmonary hypertension.  There is chronic right lower lobe atelectasis.  The patient was given started ceftriaxone and azithromycin.  Review of Systems: As mentioned in the history of present illness. All other systems reviewed and are negative. 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   26 mm Edwards Sapien 3 transcatheter heart valve placed via percutaneous right transfemoral approach    Severe aortic stenosis    Sleep apnea    Past Surgical History:  Procedure Laterality Date   Abdominal gangrene     ADENOIDECTOMY     Cataract surgery     COLONOSCOPY     ENDOSCOPIC RETROGRADE CHOLANGIOPANCREATOGRAPHY (ERCP) WITH PROPOFOL N/A 02/08/2018   Procedure: ENDOSCOPIC RETROGRADE CHOLANGIOPANCREATOGRAPHY (ERCP) WITH PROPOFOL;  Surgeon: Lemar Lofty., MD;  Location: [facility] ENDOSCOPY;  Service: Gastroenterology;  Laterality: N/A;   EUS  02/08/2018   Procedure: UPPER ENDOSCOPIC ULTRASOUND (EUS) LINEAR;  Surgeon: Corliss Parish  Montez Hageman., MD;  Location: [facility] ENDOSCOPY;  Service: Gastroenterology;;   EYE SURGERY     IR FLUORO RM 30-60 MIN  03/27/2018   IR PERC CHOLECYSTOSTOMY  02/09/2018   IR RADIOLOGIST EVAL & MGMT  03/26/2018   IR RADIOLOGY PERIPHERAL GUIDED IV START  05/22/2018   IR RADIOLOGY PERIPHERAL GUIDED IV START  05/30/2018   IR RADIOLOGY PERIPHERAL GUIDED IV START   06/05/2018   IR US GUIDE VASC ACCESS RIGHT  05/22/2018   IR US GUIDE VASC ACCESS RIGHT  05/30/2018   IR US GUIDE VASC ACCESS RIGHT  06/05/2018   PACEMAKER IMPLANT N/A 07/10/2018   Procedure: PACEMAKER IMPLANT;  Surgeon: Marinus Maw, MD;  Location: [facility] INVASIVE CV LAB;  Service: Cardiovascular;  Laterality: N/A;   REMOVAL OF STONES  02/08/2018   Procedure: REMOVAL OF STONES;  Surgeon: Meridee Score Netty Starring., MD;  Location: [facility] ENDOSCOPY;  Service: Gastroenterology;;   Dennison Mascot  02/08/2018   Procedure: Dennison Mascot;  Surgeon: Mansouraty, Netty Starring., MD;  Location: [facility] ENDOSCOPY;  Service: Gastroenterology;;   TONSILLECTOMY     TOOTH EXTRACTION N/A 06/18/2018   Procedure: DENTAL RESTORATION/EXTRACTIONS;  Surgeon: Ocie Doyne, DDS;  Location: [facility] OR;  Service: Oral Surgery;  Laterality: N/A;   TRANSCATHETER AORTIC VALVE REPLACEMENT, TRANSFEMORAL N/A 07/09/2018   Procedure: TRANSCATHETER AORTIC VALVE REPLACEMENT, TRANSFEMORAL;  Surgeon: Tonny Bollman, MD;  Location: [facility] INVASIVE CV LAB;  Service: Cardiovascular;  Laterality: N/A;   Tummy tuck     Social History:  reports that she quit smoking about 45 years ago. Her smoking use included cigarettes. She has never been exposed to tobacco smoke. She has never used smokeless tobacco. She reports that she does not currently use alcohol. She reports that she does not use drugs.  No Known Allergies  Family History  Problem Relation Age of Onset   Cancer Mother    Hypertension Father    Arthritis Father    Heart attack Father     Prior to Admission medications   Medication Sig Start Date End Date Taking? Authorizing Provider  acetaminophen (TYLENOL) 325 MG tablet Take 2 tablets (650 mg total) by mouth every 6 (six) hours as needed for mild pain, fever or headache (or Fever >/= 101). 07/10/19   Shon Hale, MD  albuterol (VENTOLIN HFA) 108 (90 Base) MCG/ACT inhaler Inhale 2 puffs into the lungs every 6 (six) hours as needed. 10/24/21    [provider]  aspirin EC 81 MG tablet Take 1 tablet (81 mg total) by mouth daily with breakfast. 05/12/22   Shon Hale, MD  Calcium Carbonate (CALCIUM-CARB 600 PO) Take 600 mg by mouth daily.    [provider]  cholecalciferol (VITAMIN D) 1000 units tablet Take 1,000 Units by mouth daily.    [provider]  dextromethorphan-guaiFENesin (MUCINEX DM) 30-600 MG 12hr tablet Take 1 tablet by mouth 2 (two) times daily. Patient taking differently: Take 1 tablet by mouth as needed for cough. 05/12/22   Shon Hale, MD  diclofenac Sodium (VOLTAREN) 1 % GEL Apply 2 g topically 4 (four) times daily. 10/20/22 10/20/23  [provider]  doxycycline (VIBRA-TABS) 100 MG tablet Take 1 tablet by mouth 2 (two) times daily. 07/03/23   [provider]  ferrous sulfate 325 (65 FE) MG tablet Take 325 mg by mouth daily with breakfast.    [provider]  gabapentin (NEURONTIN) 300 MG capsule Take 300 mg by mouth 3 (three) times daily.    [provider]  ipratropium-albuterol (DUONEB) 0.5-2.5 (  3) MG/3ML SOLN Take 3 mLs by nebulization 2 (two) times daily. 04/07/22   ShahmehdiGemma Payor, MD  Multiple Vitamin (MULTIVITAMIN WITH MINERALS) TABS tablet Take 1 tablet by mouth daily.    [provider]  nystatin (MYCOSTATIN/NYSTOP) powder APPLY TO AFFECTED AREA TWICE A DAY    [provider]  OXYGEN Inhale 3 L into the lungs. With long tube mostly 1-1.5    [provider]  polyethylene glycol (MIRALAX MIX-IN PAX) 17 g packet Take 17 g by mouth daily. 05/12/22   Shon Hale, MD  potassium chloride (KLOR-CON) 10 MEQ tablet Take 1 tablet (10 mEq total) by mouth daily. Take While taking Demadex/Torsemide 05/12/22   Shon Hale, MD  pravastatin (PRAVACHOL) 20 MG tablet Take 20 mg by mouth daily.    [provider]  silver sulfADIAZINE (SILVADENE) 1 % cream Apply 1 Application topically daily as needed.    [provider]  sulfamethoxazole-trimethoprim (BACTRIM DS) 800-160 MG tablet Take 1 tablet by mouth every 12 (twelve) hours. 07/09/23   [provider]  torsemide (DEMADEX) 20 MG tablet Take 2 tablets (40 mg total) by mouth 2 (two) times daily. 11/22/22   Jonelle Sidle, MD  venlafaxine (EFFEXOR) 37.5 MG tablet Take 37.5 mg by mouth daily.    [provider]  Vibegron (GEMTESA) 75 MG TABS Take 1 tablet (75 mg total) by mouth daily. 06/20/23   McKenzie, Mardene Celeste, MD  vitamin C (ASCORBIC ACID) 500 MG tablet Take 500 mg by mouth daily.    [provider]    Physical Exam: Vitals:   07/21/23 0930 07/21/23 1000 07/21/23 1027 07/21/23 1536  BP: 128/89 121/67    Pulse: (!) 108 (!) 108    Resp: 20 17    Temp: 98 F (36.7 C)     TempSrc: Oral     SpO2: 93% 92% 92%   Weight: 116.6 kg     Height: 5\' 5"  (1.651 m)   5\' 6"  (1.676 m)   GENERAL:  A&O x 3, NAD, well developed, cooperative, follows commands HEENT: [place]/AT, No thrush, No icterus, No oral ulcers Neck:  No neck mass, No meningismus, soft, supple CV: RRR, no S3, no S4, no rub, no JVD Lungs: Bilateral rales.  Bibasilar expiratory wheeze. Abd: soft/NT +BS, nondistended Ext: 1 + LE edema, with erythema; no crepitance.  No lymphangitis.  No draining or necrotic wounds.  Mild erythema in the posterior thighs Neuro:  CN II-XII intact, strength 4/5 in RUE, RLE, strength 4/5 LUE, LLE; sensation intact bilateral; no dysmetria; babinski equivocal  Data Reviewed: Data reviewed above in history  Assessment and Plan: Generalized weakness -Multifactorial including deconditioning, dehydration, and possibly infection -UA negative for pyuria -PT evaluation -Judicious IV fluids -B12 -TSH -Folic acid  Cellulitis lower extremities -Suspect the patient may have superimposed mild cellulitis -Continue ceftriaxone for now  Pulmonary infiltrates/opacity -07/21/2023 chest x-ray with retrocardiac opacity -07/21/2023 CTA  chest negative for PE; mosaic parenchymal attenuation -Start DuoNebs -Start Pulmicort -COVID-19/RSV/Flu neg -Check viral respiratory panel -check PCT -Urine Legionella antigen -empiric azithro  Gluteal/buttock wound -Not infected on exam -Wound care consult  Sinus tachycardia -Reactive secondary to past placed dehydration and infectious process -TSH -This IV fluids -Monitor on telemetry  Chronic respiratory failure with hypoxia and hypercarbia -Chronically on 4 L at home  Chronic HFpEF -Clinically euvolemic -Holding torsemide temporarily -12/04/2022 echo EF 60 to 65%, no WMA, grade 1 DD, mild decrease -Update echo  Mixed hyperlipidemia -Continue statin  Aortic stenosis status post TAVR -Underwent TAVR 06/2018  OSA -Patient compliant with CPAP at home -Continue in the hospital  Morbid obesity -BMI 42.78 -Lifestyle modification to     Advance Care Planning: FULL  Consults: none  Family Communication: daughter updated 3/22  Severity of Illness: The appropriate patient status for this patient is OBSERVATION. Observation status is judged to be reasonable and necessary in order to provide the required intensity of service to ensure the patient's safety. The patient's presenting symptoms, physical exam findings, and initial radiographic and laboratory data in the context of their medical condition is felt to place them at decreased risk for further clinical deterioration. Furthermore, it is anticipated that the patient will be medically stable for discharge from the hospital within 2 midnights of admission.   Author: Catarina Hartshorn, MD 07/21/2023 3:49 PM  For on call review [email or web address].

## 2023-07-21 NOTE — ED Provider Notes (Signed)
 [place] EMERGENCY DEPARTMENT AT [facility] Provider Note   CSN: [id number] Arrival date & time: 07/21/23  4098     History  Chief Complaint  Patient presents with   Urinary Tract Infection    Dawn Gonzales is a 83 y.o. female.  83 year old female with past medical history of CVA, hypertension, hyperlipidemia, and aortic valve replacement who is on home oxygen at baseline presenting to the emergency department today with concern for some mildly elevated heart rate and fatigue.  The patient was diagnosed with urinary tract infection back on the 10th of this month.  She completed 7 days of antibiotics.  Her daughter states that she was back to her baseline but she has started to feel little less active than normal over the past few days.  She has had a cough that has been minimally productive.  This been going now for the past few weeks.  She has been relatively stable on her home oxygen.  She is normally on 4 L.  Her daughter states that she has noted that her pulse ox is dropped to the low 90s and she is normally in the high 90s.  She was brought today for further evaluation regarding this.  The patient has been afebrile.   Urinary Tract Infection      Home Medications Prior to Admission medications   Medication Sig Start Date End Date Taking? Authorizing Provider  acetaminophen (TYLENOL) 325 MG tablet Take 2 tablets (650 mg total) by mouth every 6 (six) hours as needed for mild pain, fever or headache (or Fever >/= 101). 07/10/19   Shon Hale, MD  albuterol (VENTOLIN HFA) 108 (90 Base) MCG/ACT inhaler Inhale 2 puffs into the lungs every 6 (six) hours as needed. 10/24/21   [provider]  aspirin EC 81 MG tablet Take 1 tablet (81 mg total) by mouth daily with breakfast. 05/12/22   Shon Hale, MD  Calcium Carbonate (CALCIUM-CARB 600 PO) Take 600 mg by mouth daily.    [provider]  cholecalciferol (VITAMIN D) 1000 units tablet Take  1,000 Units by mouth daily.    [provider]  dextromethorphan-guaiFENesin (MUCINEX DM) 30-600 MG 12hr tablet Take 1 tablet by mouth 2 (two) times daily. Patient taking differently: Take 1 tablet by mouth as needed for cough. 05/12/22   Shon Hale, MD  diclofenac Sodium (VOLTAREN) 1 % GEL Apply 2 g topically 4 (four) times daily. 10/20/22 10/20/23  [provider]  doxycycline (VIBRA-TABS) 100 MG tablet Take 1 tablet by mouth 2 (two) times daily. 07/03/23   [provider]  ferrous sulfate 325 (65 FE) MG tablet Take 325 mg by mouth daily with breakfast.    [provider]  gabapentin (NEURONTIN) 300 MG capsule Take 300 mg by mouth 3 (three) times daily.    [provider]  ipratropium-albuterol (DUONEB) 0.5-2.5 (3) MG/3ML SOLN Take 3 mLs by nebulization 2 (two) times daily. 04/07/22   ShahmehdiGemma Payor, MD  Multiple Vitamin (MULTIVITAMIN WITH MINERALS) TABS tablet Take 1 tablet by mouth daily.    [provider]  nystatin (MYCOSTATIN/NYSTOP) powder APPLY TO AFFECTED AREA TWICE A DAY    [provider]  OXYGEN Inhale 3 L into the lungs. With long tube mostly 1-1.5    [provider]  polyethylene glycol (MIRALAX MIX-IN PAX) 17 g packet Take 17 g by mouth daily. 05/12/22   Shon Hale, MD  potassium chloride (KLOR-CON) 10 MEQ tablet Take 1 tablet (10 mEq  total) by mouth daily. Take While taking Demadex/Torsemide 05/12/22   Shon Hale, MD  pravastatin (PRAVACHOL) 20 MG tablet Take 20 mg by mouth daily.    [provider]  silver sulfADIAZINE (SILVADENE) 1 % cream Apply 1 Application topically daily as needed.    [provider]  sulfamethoxazole-trimethoprim (BACTRIM DS) 800-160 MG tablet Take 1 tablet by mouth every 12 (twelve) hours. 07/09/23   [provider]  torsemide (DEMADEX) 20 MG tablet Take 2 tablets (40 mg total) by mouth 2 (two) times daily. 11/22/22   Jonelle Sidle, MD   venlafaxine (EFFEXOR) 37.5 MG tablet Take 37.5 mg by mouth daily.    [provider]  Vibegron (GEMTESA) 75 MG TABS Take 1 tablet (75 mg total) by mouth daily. 06/20/23   McKenzie, Mardene Celeste, MD  vitamin C (ASCORBIC ACID) 500 MG tablet Take 500 mg by mouth daily.    [provider]      Allergies    Patient has no known allergies.    Review of Systems   Review of Systems  Constitutional:  Positive for fatigue.  Respiratory:  Positive for cough.   All other systems reviewed and are negative.   Physical Exam Updated Vital Signs BP 121/67   Pulse (!) 108   Temp 98 F (36.7 C) (Oral)   Resp 17   Ht 5\' 6"  (1.676 m)   Wt 116.6 kg   SpO2 92%   BMI 41.49 kg/m  Physical Exam Vitals and nursing note reviewed.   Gen: NAD, chronically ill-appearing, pursed lip breathing noted Eyes: PERRL, EOMI HEENT: no oropharyngeal swelling Neck: trachea midline Resp: clear to auscultation bilaterally although diminished at bilateral lung bases, habitus does make exam somewhat difficult Card: RRR, no murmurs, rubs, or gallops Abd: nontender, nondistended Extremities: no calf tenderness, no edema Vascular: 2+ radial pulses bilaterally, 2+ DP pulses bilaterally Neuro: No focal deficits Skin: no rashes Psyc: acting appropriately   ED Results / Procedures / Treatments   Labs (all labs ordered are listed, but only abnormal results are displayed) Labs Reviewed  COMPREHENSIVE METABOLIC PANEL - Abnormal; Notable for the following components:      Result Value   Chloride 92 (*)    CO2 36 (*)    Glucose, Bld 104 (*)    BUN 52 (*)    GFR, Estimated 59 (*)    All other components within normal limits  APTT - Abnormal; Notable for the following components:   aPTT 45 (*)    All other components within normal limits  URINALYSIS, W/ REFLEX TO CULTURE (INFECTION SUSPECTED) - Abnormal; Notable for the following components:   Color, Urine STRAW (*)    All other components within  normal limits  RESP PANEL BY RT-PCR (RSV, FLU A&B, COVID)  [id number]  CULTURE, BLOOD (ROUTINE X 2)  CULTURE, BLOOD (ROUTINE X 2)  LACTIC ACID, PLASMA  LACTIC ACID, PLASMA  CBC WITH DIFFERENTIAL/PLATELET  PROTIME-INR    EKG EKG Interpretation Date/Time:  Saturday July 21 2023 10:36:33 EDT Ventricular Rate:  109 PR Interval:  143 QRS Duration:  174 QT Interval:  439 QTC Calculation: 592 R Axis:   -30  Text Interpretation: Sinus tachycardia Right bundle branch block Confirmed by Beckey Downing [phone]) on 07/21/2023 10:45:42 AM  Radiology CT Angio Chest PE W and/or Wo Contrast Result Date: 07/21/2023 CLINICAL DATA:  Tachycardia.  Possible pulmonary embolus. EXAM: CT ANGIOGRAPHY CHEST WITH CONTRAST TECHNIQUE: Multidetector CT imaging of the chest was performed using the  standard protocol during bolus administration of intravenous contrast. Multiplanar CT image reconstructions and MIPs were obtained to evaluate the vascular anatomy. RADIATION DOSE REDUCTION: This exam was performed according to the departmental dose-optimization program which includes automated exposure control, adjustment of the mA and/or kV according to patient size and/or use of iterative reconstruction technique. CONTRAST:  75mL OMNIPAQUE IOHEXOL 350 MG/ML SOLN COMPARISON:  04/05/2022. FINDINGS: Cardiovascular: Negative for pulmonary embolus. Atherosclerotic calcification of the aorta and coronary arteries. Aortic valve replacement. Enlarged pulmonic trunk and heart. No pericardial effusion. Mediastinum/Nodes: No pathologically enlarged mediastinal, hilar or axillary lymph nodes. Esophagus is grossly unremarkable. Lungs/Pleura: Image quality is somewhat degraded by expiratory phase imaging and respiratory motion. Mosaic pulmonary parenchymal attenuation, as before. Chronic atelectasis in the right lung base, adjacent to an elevated right hemidiaphragm. No pleural fluid. Airway is unremarkable. Upper Abdomen: Chronic pneumobilia.  Subcentimeter lesions in the kidneys, too small to characterize. No specific follow-up necessary. Probable splenic cyst. Visualized portions of the liver, gallbladder, adrenal glands, kidneys, spleen, pancreas, stomach and bowel are otherwise grossly unremarkable. No upper abdominal adenopathy. Musculoskeletal: Degenerative changes in the spine. Review of the MIP images confirms the above findings. IMPRESSION: 1. Negative for pulmonary embolus. 2. Mosaic pulmonary parenchymal attenuation is nonspecific but can be seen in the setting of small airways disease. 3. Aortic atherosclerosis (ICD10-I70.0). Coronary artery calcification. 4. Enlarged pulmonic trunk, indicative of pulmonary arterial hypertension. Electronically Signed   By: Leanna Battles M.D.   On: 07/21/2023 13:47   DG Chest Port 1 View Result Date: 07/21/2023 CLINICAL DATA:  Sepsis EXAM: PORTABLE CHEST 1 VIEW COMPARISON:  Chest radiograph dated 05/09/2022 FINDINGS: Lines/tubes: Left chest wall pacemaker leads project over the right atrium and ventricle. Lungs: Low lung volumes with bronchovascular crowding. Unchanged right lower lung linear opacity and increased dense left retrocardiac opacity. Pleura: No pneumothorax or definite pleural effusion. Heart/mediastinum: Similar markedly enlarged cardiomediastinal silhouette. Bones: No acute osseous abnormality. IMPRESSION: 1. Low lung volumes with bronchovascular crowding. Unchanged right lower lung linear opacity and increased dense left retrocardiac opacity, which may represent atelectasis, aspiration, or pneumonia. 2. Similar markedly enlarged cardiomediastinal silhouette. Electronically Signed   By: Agustin Cree M.D.   On: 07/21/2023 10:54    Procedures Procedures    Medications Ordered in ED Medications  azithromycin (ZITHROMAX) 500 mg in sodium chloride 0.9 % 250 mL IVPB (500 mg Intravenous New Bag/Given 07/21/23 1535)  ipratropium-albuterol (DUONEB) 0.5-2.5 (3) MG/3ML nebulizer solution 3 mL (3  mLs Nebulization Given 07/21/23 1027)  lactated ringers bolus 500 mL (500 mLs Intravenous Bolus 07/21/23 1054)  lactated ringers bolus 500 mL (500 mLs Intravenous Bolus 07/21/23 1252)  iohexol (OMNIPAQUE) 350 MG/ML injection 75 mL (75 mLs Intravenous Contrast Given 07/21/23 1301)  cefTRIAXone (ROCEPHIN) 2 g in sodium chloride 0.9 % 100 mL IVPB (0 g Intravenous Stopped 07/21/23 1538)    ED Course/ Medical Decision Making/ A&P                                 Medical Decision Making 83 year old female with past medical history of CVA, aortic valve replacement, hypertension, and hyperlipidemia presenting to the emergency department today with fatigue with cough.  The patient was also recently treated for urinary tract infection.  I will further evaluate the patient here with a sepsis workup.  I give the patient a DuoNeb here.  Will obtain an EKG as she is tachycardic here to evaluate for sinus tachycardia  versus arrhythmia.  Will also obtain a COVID and flu swab on the patient.  I will reevaluate for ultimate disposition.  The patient remained tachycardic here after initial evaluation.  Initial labs are reassuring.  Urinalysis does not show findings consistent with infection.  Her heart rate remained elevated.   CTA is ordered and showed small airway disease.  The patient is covered with Rocephin and azithromycin.  A call was placed to hospitalist service for admission.  Amount and/or Complexity of Data Reviewed Labs: ordered. Radiology: ordered.  Risk Prescription drug management. Decision regarding hospitalization.           Final Clinical Impression(s) / ED Diagnoses Final diagnoses:  Tachycardia  Small airways disease    Rx / DC Orders ED Discharge Orders     None         Durwin Glaze, MD 07/21/23 [phone]

## 2023-07-22 ENCOUNTER — Observation Stay (HOSPITAL_COMMUNITY)

## 2023-07-22 DIAGNOSIS — R531 Weakness: Secondary | ICD-10-CM

## 2023-07-22 DIAGNOSIS — J9611 Chronic respiratory failure with hypoxia: Secondary | ICD-10-CM

## 2023-07-22 DIAGNOSIS — L03115 Cellulitis of right lower limb: Secondary | ICD-10-CM | POA: Diagnosis present

## 2023-07-22 DIAGNOSIS — I342 Nonrheumatic mitral (valve) stenosis: Secondary | ICD-10-CM | POA: Diagnosis not present

## 2023-07-22 DIAGNOSIS — J9612 Chronic respiratory failure with hypercapnia: Secondary | ICD-10-CM | POA: Diagnosis present

## 2023-07-22 DIAGNOSIS — Z952 Presence of prosthetic heart valve: Secondary | ICD-10-CM

## 2023-07-22 DIAGNOSIS — Z7982 Long term (current) use of aspirin: Secondary | ICD-10-CM | POA: Diagnosis not present

## 2023-07-22 DIAGNOSIS — Z79899 Other long term (current) drug therapy: Secondary | ICD-10-CM | POA: Diagnosis not present

## 2023-07-22 DIAGNOSIS — L03116 Cellulitis of left lower limb: Secondary | ICD-10-CM | POA: Diagnosis present

## 2023-07-22 DIAGNOSIS — I4719 Other supraventricular tachycardia: Secondary | ICD-10-CM | POA: Diagnosis present

## 2023-07-22 DIAGNOSIS — L03119 Cellulitis of unspecified part of limb: Secondary | ICD-10-CM | POA: Diagnosis not present

## 2023-07-22 DIAGNOSIS — J9811 Atelectasis: Secondary | ICD-10-CM | POA: Diagnosis present

## 2023-07-22 DIAGNOSIS — E860 Dehydration: Secondary | ICD-10-CM | POA: Diagnosis present

## 2023-07-22 DIAGNOSIS — I5032 Chronic diastolic (congestive) heart failure: Secondary | ICD-10-CM | POA: Diagnosis present

## 2023-07-22 DIAGNOSIS — G4733 Obstructive sleep apnea (adult) (pediatric): Secondary | ICD-10-CM | POA: Diagnosis present

## 2023-07-22 DIAGNOSIS — Z9981 Dependence on supplemental oxygen: Secondary | ICD-10-CM | POA: Diagnosis not present

## 2023-07-22 DIAGNOSIS — E782 Mixed hyperlipidemia: Secondary | ICD-10-CM | POA: Diagnosis present

## 2023-07-22 DIAGNOSIS — Z8673 Personal history of transient ischemic attack (TIA), and cerebral infarction without residual deficits: Secondary | ICD-10-CM | POA: Diagnosis not present

## 2023-07-22 DIAGNOSIS — R0609 Other forms of dyspnea: Secondary | ICD-10-CM

## 2023-07-22 DIAGNOSIS — L89313 Pressure ulcer of right buttock, stage 3: Secondary | ICD-10-CM | POA: Diagnosis present

## 2023-07-22 DIAGNOSIS — Z6841 Body Mass Index (BMI) 40.0 and over, adult: Secondary | ICD-10-CM | POA: Diagnosis not present

## 2023-07-22 DIAGNOSIS — R000 Tachycardia, unspecified: Secondary | ICD-10-CM | POA: Diagnosis present

## 2023-07-22 DIAGNOSIS — Z953 Presence of xenogenic heart valve: Secondary | ICD-10-CM | POA: Diagnosis not present

## 2023-07-22 DIAGNOSIS — Z1152 Encounter for screening for COVID-19: Secondary | ICD-10-CM | POA: Diagnosis not present

## 2023-07-22 DIAGNOSIS — Z87891 Personal history of nicotine dependence: Secondary | ICD-10-CM | POA: Diagnosis not present

## 2023-07-22 DIAGNOSIS — Z950 Presence of cardiac pacemaker: Secondary | ICD-10-CM | POA: Diagnosis not present

## 2023-07-22 DIAGNOSIS — Z8249 Family history of ischemic heart disease and other diseases of the circulatory system: Secondary | ICD-10-CM | POA: Diagnosis not present

## 2023-07-22 DIAGNOSIS — I4510 Unspecified right bundle-branch block: Secondary | ICD-10-CM | POA: Diagnosis present

## 2023-07-22 DIAGNOSIS — I110 Hypertensive heart disease with heart failure: Secondary | ICD-10-CM | POA: Diagnosis present

## 2023-07-22 LAB — CBC
HCT: 39.5 % (ref 36.0–46.0)
Hemoglobin: 12.3 g/dL (ref 12.0–15.0)
MCH: 29.8 pg (ref 26.0–34.0)
MCHC: 31.1 g/dL (ref 30.0–36.0)
MCV: 95.6 fL (ref 80.0–100.0)
Platelets: 250 10*3/uL (ref 150–400)
RBC: 4.13 MIL/uL (ref 3.87–5.11)
RDW: 14 % (ref 11.5–15.5)
WBC: 7.1 10*3/uL (ref 4.0–10.5)
nRBC: 0 % (ref 0.0–0.2)

## 2023-07-22 LAB — BASIC METABOLIC PANEL
Anion gap: 11 (ref 5–15)
BUN: 40 mg/dL — ABNORMAL HIGH (ref 8–23)
CO2: 30 mmol/L (ref 22–32)
Calcium: 9.1 mg/dL (ref 8.9–10.3)
Chloride: 99 mmol/L (ref 98–111)
Creatinine, Ser: 0.98 mg/dL (ref 0.44–1.00)
GFR, Estimated: 57 mL/min — ABNORMAL LOW (ref >60)
Glucose, Bld: 87 mg/dL (ref 70–99)
Potassium: 3.6 mmol/L (ref 3.5–5.1)
Sodium: 140 mmol/L (ref 135–145)

## 2023-07-22 LAB — STREP PNEUMONIAE URINARY ANTIGEN: Strep Pneumo Urinary Antigen: NEGATIVE

## 2023-07-22 MED ORDER — DERMACERIN EX CREA
TOPICAL_CREAM | Freq: Two times a day (BID) | CUTANEOUS | Status: DC
  Filled 2023-07-22: qty 113

## 2023-07-22 NOTE — Progress Notes (Signed)
 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 At baseline, the patient lives by herself.  She is able to perform ADLs with no assistance.  She ambulates with a walker. The patient went to see her PCP on 07/04/2023 secondary to tachycardia and some generalized fatigue.  Patient was initially placed on doxycycline for concerns of skin tear on her buttocks being possibly infected.  On 07/09/2023, her PCP office called back and placed the patient on Bactrim.  Patient finished a 10-day course of doxycycline 7-day course of Bactrim DS.  Her tachycardia improved back to baseline with heart rate in the 70s.  However, 3 days later the patient once again began developing some tachycardia with heart rate 110-120s.  Patient states that she has been feeling "sluggish" for the past 3 days.  She denies any headache, neck pain, fevers, chills, chest pain, nausea, vomiting or diarrhea, abdominal pain.  She denies any dysuria or hematuria.  She has had some dyspnea on exertion and nonproductive cough.  Besides the antibiotics described above, there is been no new medications.  Because of her generalized weakness and tachycardia, the patient was brought to emergency for further evaluation and treatment. In the ED, patient is afebrile and hemodynamically stable with oxygen saturation 93% on 3 L.  WBC 8.6, hemoglobin 13.4, platelets 287.  3140, potassium 4.0, bicarbonate 36, serum creatinine 0.96.  LFTs were unremarkable.  Lactic acid 1.0>> 1.1.  CTA chest was negative for PE.  There was mosaic parenchymal attenuation.  There is enlarged pulmonary  trunk consistent with pulmonary hypertension.  There is chronic right lower lobe atelectasis.  The patient was given started ceftriaxone and azithromycin.   Assessment/Plan: Generalized weakness -Multifactorial including deconditioning, dehydration, and possibly infection -UA negative for pyuria -PT evaluation -Judicious IV fluids -B12--420 -TSH--3.994 -Folic acid-->40 -CK 67   Cellulitis lower extremities -Suspect the patient may have superimposed mild cellulitis -Continue ceftriaxone for now   Pulmonary infiltrates/opacity -07/21/2023 chest x-ray with retrocardiac opacity -07/21/2023 CTA chest negative for PE; mosaic parenchymal attenuation -continue DuoNebs -continue Pulmicort -COVID-19/RSV/Flu neg -viral respiratory panel--neg -check PCT <0.10 -Urine Legionella antigen--pending -empiric azithro   Gluteal/buttock wound -Not infected on exam -Wound care consult appreciated   Sinus tachycardia -Reactive secondary to past placed dehydration and infectious process -TSH--3.994 -judicious IV fluids -Monitor on telemetry -personally reviewed EKG--sinus, no STT change   Chronic respiratory failure with hypoxia and hypercarbia -Chronically on 4 L at home   Chronic HFpEF -Clinically euvolemic -Holding torsemide temporarily -12/04/2022 echo EF 60 to 65%, no WMA, grade 1 DD, mild decrease -Update echo   Mixed hyperlipidemia -Continue statin   Aortic stenosis status post TAVR -Underwent TAVR 06/2018   OSA -Patient poor compliant with CPAP at home -Continue in the hospital   Morbid obesity -BMI 42.78 -Lifestyle modification to     Family Communication:  daughter updated at bedside 3/23  Consultants:  none  Code Status:  FULL   DVT Prophylaxis:  [place] Lovenox   Procedures: As Listed in Progress Note Above  Antibiotics: Azithro 3/22>>        Subjective: Patient denies fevers, chills, headache, chest pain, dyspnea, nausea,  vomiting, diarrhea, abdominal  pain, dysuria, hematuria, hematochezia, and melena.   Objective: Vitals:   07/22/23 0052 07/22/23 0424 07/22/23 1003 07/22/23 1457  BP: (!) 104/54 120/61    Pulse: (!) 106 (!) 108    Resp:      Temp: 97.8 F (36.6 C) (!) 97.5 F (36.4 C)    TempSrc: Oral Oral    SpO2: 94% 93% 90% 90%  Weight:      Height:        Intake/Output Summary (Last 24 hours) at 07/22/2023 1559 Last data filed at 07/22/2023 0539 Gross per 24 hour  Intake 742.5 ml  Output 600 ml  Net 142.5 ml   Weight change:  Exam:  General:  Pt is alert, follows commands appropriately, not in acute distress HEENT: No icterus, No thrush, No neck mass, [place]/AT Cardiovascular: RRR, S1/S2, no rubs, no gallops Respiratory: bibasilar rales. No wheeze Abdomen: Soft/+BS, non tender, non distended, no guarding Extremities: No edema, No lymphangitis, No petechiae, No rashes, no synovitis   Data Reviewed: I have personally reviewed following labs and imaging studies Basic Metabolic Panel: Recent Labs  Lab 07/21/23 0940 07/21/23 1741 07/22/23 0348  NA 140  --  140  K 4.0  --  3.6  CL 92*  --  99  CO2 36*  --  30  GLUCOSE 104*  --  87  BUN 52*  --  40*  CREATININE 0.96  --  0.98  CALCIUM 9.8  --  9.1  MG  --  2.7*  --    Liver Function Tests: Recent Labs  Lab 07/21/23 0940  AST 23  ALT 19  ALKPHOS 72  BILITOT 0.7  PROT 8.1  ALBUMIN 4.1   No results for input(s): "LIPASE", "AMYLASE" in the last 168 hours. No results for input(s): "AMMONIA" in the last 168 hours. Coagulation Profile: Recent Labs  Lab 07/21/23 0940  INR 1.0   CBC: Recent Labs  Lab 07/21/23 0940 07/22/23 0348  WBC 8.6 7.1  NEUTROABS 5.6  --   HGB 13.4 12.3  HCT 42.4 39.5  MCV 94.2 95.6  PLT 287 250   Cardiac Enzymes: Recent Labs  Lab 07/21/23 1741  CKTOTAL 67   BNP: Invalid input(s): "POCBNP" CBG: No results for input(s): "GLUCAP" in the last 168 hours. HbA1C: No results for input(s): "HGBA1C" in the last 72  hours. Urine analysis:    Component Value Date/Time   COLORURINE STRAW (A) 07/21/2023 1023   APPEARANCEUR CLEAR 07/21/2023 1023   LABSPEC 1.009 07/21/2023 1023   PHURINE 6.0 07/21/2023 1023   GLUCOSEU NEGATIVE 07/21/2023 1023   HGBUR NEGATIVE 07/21/2023 1023   BILIRUBINUR NEGATIVE 07/21/2023 1023   KETONESUR NEGATIVE 07/21/2023 1023   PROTEINUR NEGATIVE 07/21/2023 1023   NITRITE NEGATIVE 07/21/2023 1023   LEUKOCYTESUR NEGATIVE 07/21/2023 1023   Sepsis Labs: @LABRCNTIP (procalcitonin:4,lacticidven:4) ) Recent Results (from the past 240 hours)  Resp panel by RT-PCR (RSV, Flu A&B, Covid) Anterior Nasal Swab     Status: None   Collection Time: 07/21/23  9:35 AM   Specimen: Anterior Nasal Swab  Result Value Ref Range Status   SARS Coronavirus 2 by RT PCR NEGATIVE NEGATIVE Final    Comment: (NOTE) SARS-CoV-2 target nucleic acids are NOT DETECTED.  The SARS-CoV-2 RNA is generally detectable in upper respiratory specimens during the acute phase of infection. The lowest concentration of SARS-CoV-2 viral copies this assay can detect is 138 copies/mL. A negative result does not preclude SARS-Cov-2 infection and should not be used  as the sole basis for treatment or other patient management decisions. A negative result may occur with  improper specimen collection/handling, submission of specimen other than nasopharyngeal swab, presence of viral mutation(s) within the areas targeted by this assay, and inadequate number of viral copies(<138 copies/mL). A negative result must be combined with clinical observations, patient history, and epidemiological information. The expected result is Negative.  Fact Sheet for Patients:  BloggerCourse.com  Fact Sheet for Healthcare Providers:  SeriousBroker.it  This test is no t yet approved or cleared by the [place] FDA and  has been authorized for detection and/or diagnosis of SARS-CoV-2  by FDA under an Emergency Use Authorization (EUA). This EUA will remain  in effect (meaning this test can be used) for the duration of the COVID-19 declaration under Section 564(b)(1) of the Act, 21 U.S.C.section 360bbb-3(b)(1), unless the authorization is terminated  or revoked sooner.       Influenza A by PCR NEGATIVE NEGATIVE Final   Influenza B by PCR NEGATIVE NEGATIVE Final    Comment: (NOTE) The Xpert Xpress SARS-CoV-2/FLU/RSV plus assay is intended as an aid in the diagnosis of influenza from Nasopharyngeal swab specimens and should not be used as a sole basis for treatment. Nasal washings and aspirates are unacceptable for Xpert Xpress SARS-CoV-2/FLU/RSV testing.  Fact Sheet for Patients: BloggerCourse.com  Fact Sheet for Healthcare Providers: SeriousBroker.it  This test is not yet approved or cleared by the [place] FDA and has been authorized for detection and/or diagnosis of SARS-CoV-2 by FDA under an Emergency Use Authorization (EUA). This EUA will remain in effect (meaning this test can be used) for the duration of the COVID-19 declaration under Section 564(b)(1) of the Act, 21 U.S.C. section 360bbb-3(b)(1), unless the authorization is terminated or revoked.     Resp Syncytial Virus by PCR NEGATIVE NEGATIVE Final    Comment: (NOTE) Fact Sheet for Patients: BloggerCourse.com  Fact Sheet for Healthcare Providers: SeriousBroker.it  This test is not yet approved or cleared by the [place] FDA and has been authorized for detection and/or diagnosis of SARS-CoV-2 by FDA under an Emergency Use Authorization (EUA). This EUA will remain in effect (meaning this test can be used) for the duration of the COVID-19 declaration under Section 564(b)(1) of the Act, 21 U.S.C. section 360bbb-3(b)(1), unless the authorization is terminated or revoked.  Performed at  [facility], [address] [place], [place] [address]   Blood Culture (routine x 2)     Status: None (Preliminary result)   Collection Time: 07/21/23  9:40 AM   Specimen: BLOOD RIGHT FOREARM  Result Value Ref Range Status   Specimen Description   Final    BLOOD RIGHT FOREARM BOTTLES DRAWN AEROBIC AND ANAEROBIC   Special Requests Blood Culture adequate volume  Final   Culture   Final    NO GROWTH < 24 HOURS Performed at [facility], [address] [place], [place] [address]    Report Status PENDING  Incomplete  Blood Culture (routine x 2)     Status: None (Preliminary result)   Collection Time: 07/21/23 10:05 AM   Specimen: BLOOD RIGHT HAND  Result Value Ref Range Status   Specimen Description   Final    BLOOD RIGHT HAND BOTTLES DRAWN AEROBIC AND ANAEROBIC   Special Requests Blood Culture adequate volume  Final   Culture   Final    NO GROWTH < 24 HOURS Performed at [facility], [address], [place], [place] [address]    Report Status  PENDING  Incomplete  Respiratory (~20 pathogens) panel by PCR     Status: None   Collection Time: 07/21/23  4:22 PM   Specimen: Nasopharyngeal Swab; Respiratory  Result Value Ref Range Status   Adenovirus NOT DETECTED NOT DETECTED Final   Coronavirus 229E NOT DETECTED NOT DETECTED Final    Comment: (NOTE) The Coronavirus on the Respiratory Panel, DOES NOT test for the novel  Coronavirus (2019 nCoV)    Coronavirus HKU1 NOT DETECTED NOT DETECTED Final   Coronavirus NL63 NOT DETECTED NOT DETECTED Final   Coronavirus OC43 NOT DETECTED NOT DETECTED Final   Metapneumovirus NOT DETECTED NOT DETECTED Final   Rhinovirus / Enterovirus NOT DETECTED NOT DETECTED Final   Influenza A NOT DETECTED NOT DETECTED Final   Influenza B NOT DETECTED NOT DETECTED Final   Parainfluenza Virus 1 NOT DETECTED NOT DETECTED Final   Parainfluenza Virus 2 NOT DETECTED NOT DETECTED Final   Parainfluenza Virus 3 NOT DETECTED NOT DETECTED Final   Parainfluenza Virus 4  NOT DETECTED NOT DETECTED Final   Respiratory Syncytial Virus NOT DETECTED NOT DETECTED Final   Bordetella pertussis NOT DETECTED NOT DETECTED Final   Bordetella Parapertussis NOT DETECTED NOT DETECTED Final   Chlamydophila pneumoniae NOT DETECTED NOT DETECTED Final   Mycoplasma pneumoniae NOT DETECTED NOT DETECTED Final    Comment: Performed at [facility], [address] [place], [place] [address]     Scheduled Meds:  ascorbic acid  500 mg Oral Daily   aspirin EC  81 mg Oral Q breakfast   azithromycin  500 mg Oral Daily   budesonide (PULMICORT) nebulizer solution  0.5 mg Nebulization BID   cholecalciferol  1,000 Units Oral Daily   DermaCerin   Topical BID   enoxaparin (LOVENOX) injection  40 mg Subcutaneous Q24H   ferrous sulfate  325 mg Oral Q breakfast   gabapentin  300 mg Oral TID   ipratropium-albuterol  3 mL Nebulization TID   mirabegron ER  25 mg Oral Daily   multivitamin with minerals  1 tablet Oral Daily   pravastatin  20 mg Oral Daily   venlafaxine  37.5 mg Oral Daily   Continuous Infusions:  cefTRIAXone (ROCEPHIN)  IV 2 g (07/22/23 1442)    Procedures/Studies: CT Angio Chest PE W and/or Wo Contrast Result Date: 07/21/2023 CLINICAL DATA:  Tachycardia.  Possible pulmonary embolus. EXAM: CT ANGIOGRAPHY CHEST WITH CONTRAST TECHNIQUE: Multidetector CT imaging of the chest was performed using the standard protocol during bolus administration of intravenous contrast. Multiplanar CT image reconstructions and MIPs were obtained to evaluate the vascular anatomy. RADIATION DOSE REDUCTION: This exam was performed according to the departmental dose-optimization program which includes automated exposure control, adjustment of the mA and/or kV according to patient size and/or use of iterative reconstruction technique. CONTRAST:  75mL OMNIPAQUE IOHEXOL 350 MG/ML SOLN COMPARISON:  04/05/2022. FINDINGS: Cardiovascular: Negative for pulmonary embolus. Atherosclerotic calcification of  the aorta and coronary arteries. Aortic valve replacement. Enlarged pulmonic trunk and heart. No pericardial effusion. Mediastinum/Nodes: No pathologically enlarged mediastinal, hilar or axillary lymph nodes. Esophagus is grossly unremarkable. Lungs/Pleura: Image quality is somewhat degraded by expiratory phase imaging and respiratory motion. Mosaic pulmonary parenchymal attenuation, as before. Chronic atelectasis in the right lung base, adjacent to an elevated right hemidiaphragm. No pleural fluid. Airway is unremarkable. Upper Abdomen: Chronic pneumobilia. Subcentimeter lesions in the kidneys, too small to characterize. No specific follow-up necessary. Probable splenic cyst. Visualized portions of the liver, gallbladder, adrenal glands, kidneys, spleen, pancreas, stomach  and bowel are otherwise grossly unremarkable. No upper abdominal adenopathy. Musculoskeletal: Degenerative changes in the spine. Review of the MIP images confirms the above findings. IMPRESSION: 1. Negative for pulmonary embolus. 2. Mosaic pulmonary parenchymal attenuation is nonspecific but can be seen in the setting of small airways disease. 3. Aortic atherosclerosis (ICD10-I70.0). Coronary artery calcification. 4. Enlarged pulmonic trunk, indicative of pulmonary arterial hypertension. Electronically Signed   By: Leanna Battles M.D.   On: 07/21/2023 13:47   DG Chest Port 1 View Result Date: 07/21/2023 CLINICAL DATA:  Sepsis EXAM: PORTABLE CHEST 1 VIEW COMPARISON:  Chest radiograph dated 05/09/2022 FINDINGS: Lines/tubes: Left chest wall pacemaker leads project over the right atrium and ventricle. Lungs: Low lung volumes with bronchovascular crowding. Unchanged right lower lung linear opacity and increased dense left retrocardiac opacity. Pleura: No pneumothorax or definite pleural effusion. Heart/mediastinum: Similar markedly enlarged cardiomediastinal silhouette. Bones: No acute osseous abnormality. IMPRESSION: 1. Low lung volumes with  bronchovascular crowding. Unchanged right lower lung linear opacity and increased dense left retrocardiac opacity, which may represent atelectasis, aspiration, or pneumonia. 2. Similar markedly enlarged cardiomediastinal silhouette. Electronically Signed   By: Agustin Cree M.D.   On: 07/21/2023 10:54   CUP PACEART REMOTE DEVICE CHECK Result Date: 07/10/2023 Scheduled remote reviewed. Normal device function.  AT detections noted in the last week, EGMs reviewed on website consistent with AFL.  Overall AF burden <1%, but was increased up to almost 100% last week, corresponding to increase in v. rate from 80 bpm to > 100 bpm.  On ASA, no OAC and no h/o AF documented per Epic. Routing to triage for further review of increase in atrial arrhythmia burden from previous with no OAC per protocol. Next remote 91 days. - CS, CVRS   Catarina Hartshorn, DO  [facility]  If 7PM-7AM, please contact night-coverage [email or web address] Password [email or web address] 07/22/2023, 3:59 PM   LOS: 0 days

## 2023-07-22 NOTE — Plan of Care (Signed)
  Problem: Clinical Measurements: Goal: Ability to maintain clinical measurements within normal limits will improve Outcome: Progressing   Problem: Clinical Measurements: Goal: Respiratory complications will improve Outcome: Progressing   Problem: Activity: Goal: Risk for activity intolerance will decrease Outcome: Progressing   

## 2023-07-22 NOTE — Progress Notes (Signed)
*  PRELIMINARY RESULTS* Echocardiogram 2D Echocardiogram has been performed.  Dawn Gonzales 07/22/2023, 11:42 AM

## 2023-07-22 NOTE — Consult Note (Signed)
 WOC Nurse Consult Note: Reason for Consult: legs and buttocks Patient from home R/O UTI hx CHF and pressure injury on the buttock in January 2025.  Wound type: Stage 3 Pressure Injury; right buttock Venous dermatitis bilateral LEs Pressure Injury POA: Yes Measurement: see nursing flow sheets Wound bed:100% clean, pink, pale, moist; legs with dermatitis, does not appear to be open Drainage (amount, consistency, odor) see nursing flow sheet Periwound: intact  Dressing procedure/placement/frequency: Silver hydrofiber for recalcitrant wound with drainage daily Silicone foam to protect open areas over the sacral region  Emmollient to the LE for venous dermatitis  Re consult if needed, will not follow at this time. Thanks  Brandon Scarbrough [employer], RN,CWOCN, CNS, CWON-AP [phone])

## 2023-07-22 NOTE — Care Management Obs Status (Deleted)
 MEDICARE OBSERVATION STATUS NOTIFICATION   Patient Details  Name: JOZELYN KUWAHARA MRN: [id number] Date of Birth: [birth year]   Medicare Observation Status Notification Given:  Yes    Anabel Halon, RN 07/22/2023, 8:56 AM

## 2023-07-22 NOTE — Progress Notes (Signed)
   07/22/23 1553  TOC Brief Assessment  Insurance and Status Reviewed  Patient has primary care physician Yes  Home environment has been reviewed From Home  Prior level of function: Independent  Prior/Current Home Services No current home services  Readmission risk has been reviewed Yes  Transition of care needs no transition of care needs at this time   Transition of Care Department [facility]) has reviewed patient and no TOC needs have been identified at this time. We will continue to monitor patient advancement through interdisciplinary progression rounds. If new patient transition needs arise, please place a TOC consult.

## 2023-07-22 NOTE — Plan of Care (Signed)

## 2023-07-22 NOTE — Care Management Obs Status (Signed)
 MEDICARE OBSERVATION STATUS NOTIFICATION   Patient Details  Name: Dawn Gonzales MRN: [id number] Date of Birth: March 27, 1941   Medicare Observation Status Notification Given:  Yes    Anabel Halon, RN 07/22/2023, 8:21 AM

## 2023-07-22 NOTE — Care Management Important Message (Deleted)
 Important Message  Patient Details  Name: Dawn Gonzales MRN: [id number] Date of Birth: 11/17/1940   Important Message Given:   yes     Anabel Halon, RN 07/22/2023, 8:26 AM

## 2023-07-23 DIAGNOSIS — R531 Weakness: Secondary | ICD-10-CM | POA: Diagnosis not present

## 2023-07-23 DIAGNOSIS — I4719 Other supraventricular tachycardia: Secondary | ICD-10-CM | POA: Diagnosis not present

## 2023-07-23 DIAGNOSIS — I5032 Chronic diastolic (congestive) heart failure: Secondary | ICD-10-CM

## 2023-07-23 DIAGNOSIS — J9611 Chronic respiratory failure with hypoxia: Secondary | ICD-10-CM | POA: Diagnosis not present

## 2023-07-23 DIAGNOSIS — L03119 Cellulitis of unspecified part of limb: Secondary | ICD-10-CM | POA: Diagnosis not present

## 2023-07-23 DIAGNOSIS — I4510 Unspecified right bundle-branch block: Secondary | ICD-10-CM

## 2023-07-23 DIAGNOSIS — Z952 Presence of prosthetic heart valve: Secondary | ICD-10-CM | POA: Diagnosis not present

## 2023-07-23 DIAGNOSIS — Z950 Presence of cardiac pacemaker: Secondary | ICD-10-CM

## 2023-07-23 LAB — MAGNESIUM: Magnesium: 2.6 mg/dL — ABNORMAL HIGH (ref 1.7–2.4)

## 2023-07-23 LAB — ECHOCARDIOGRAM COMPLETE
AR max vel: 2.63 cm2
AV Area VTI: 2.6 cm2
AV Area mean vel: 2.64 cm2
AV Mean grad: 7.7 mmHg
AV Peak grad: 16.8 mmHg
Ao pk vel: 2.05 m/s
Area-P 1/2: 6.02 cm2
Calc EF: 50.1 %
Height: 66 in
MV VTI: 3.31 cm2
S' Lateral: 2.4 cm
Single Plane A2C EF: 41.2 %
Single Plane A4C EF: 56 %
Weight: 4112.9 [oz_av]

## 2023-07-23 LAB — BASIC METABOLIC PANEL
Anion gap: 9 (ref 5–15)
BUN: 26 mg/dL — ABNORMAL HIGH (ref 8–23)
CO2: 32 mmol/L (ref 22–32)
Calcium: 8.9 mg/dL (ref 8.9–10.3)
Chloride: 97 mmol/L — ABNORMAL LOW (ref 98–111)
Creatinine, Ser: 0.84 mg/dL (ref 0.44–1.00)
GFR, Estimated: >60 mL/min (ref >60)
Glucose, Bld: 103 mg/dL — ABNORMAL HIGH (ref 70–99)
Potassium: 4.2 mmol/L (ref 3.5–5.1)
Sodium: 138 mmol/L (ref 135–145)

## 2023-07-23 LAB — CBC
HCT: 36.4 % (ref 36.0–46.0)
Hemoglobin: 11.2 g/dL — ABNORMAL LOW (ref 12.0–15.0)
MCH: 29.4 pg (ref 26.0–34.0)
MCHC: 30.8 g/dL (ref 30.0–36.0)
MCV: 95.5 fL (ref 80.0–100.0)
Platelets: 258 10*3/uL (ref 150–400)
RBC: 3.81 MIL/uL — ABNORMAL LOW (ref 3.87–5.11)
RDW: 14.1 % (ref 11.5–15.5)
WBC: 7 10*3/uL (ref 4.0–10.5)
nRBC: 0 % (ref 0.0–0.2)

## 2023-07-23 MED ORDER — METOPROLOL TARTRATE 25 MG PO TABS
25.0000 mg | ORAL_TABLET | Freq: Two times a day (BID) | ORAL | Status: DC
  Administered 2023-07-23 – 2023-07-24 (×3): 25 mg via ORAL
  Filled 2023-07-23 (×3): qty 1

## 2023-07-23 MED ORDER — IPRATROPIUM-ALBUTEROL 0.5-2.5 (3) MG/3ML IN SOLN
3.0000 mL | Freq: Two times a day (BID) | RESPIRATORY_TRACT | Status: DC
  Administered 2023-07-23 – 2023-07-24 (×2): 3 mL via RESPIRATORY_TRACT
  Filled 2023-07-23 (×2): qty 3

## 2023-07-23 NOTE — Discharge Summary (Signed)
 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 At baseline, the patient lives by herself.  She is able to perform ADLs with no assistance.  She ambulates with a walker. The patient went to see her PCP on 07/04/2023 secondary to tachycardia and some generalized fatigue.  Patient was initially placed on doxycycline for concerns of skin tear on her buttocks being possibly infected.  On 07/09/2023, her PCP office called back and placed the patient on Bactrim.  Patient finished a 10-day course of doxycycline 7-day course of Bactrim DS.  Her tachycardia improved back to baseline with heart rate in the 70s.  However, 3 days later the patient once again began developing some tachycardia with heart rate 110-120s.  Patient states that she has been feeling "sluggish" for the past 3 days.  She denies any headache, neck pain, fevers, chills, chest pain, nausea, vomiting or diarrhea, abdominal pain.  She denies any dysuria or hematuria.  She has had some dyspnea on exertion and nonproductive cough.  Besides the antibiotics described above, there is been no new medications.  Because of her generalized weakness and tachycardia, the patient was brought to emergency for further evaluation and treatment. In the ED, patient is afebrile and hemodynamically stable with oxygen saturation 93% on 3 L.  WBC 8.6, hemoglobin  13.4, platelets 287.  3140, potassium 4.0, bicarbonate 36, serum creatinine 0.96.  LFTs were unremarkable.  Lactic acid 1.0>> 1.1.  CTA chest was negative for PE.  There was mosaic parenchymal attenuation.  There is enlarged pulmonary trunk consistent with pulmonary hypertension.  There is chronic right lower lobe atelectasis.  The patient was given started ceftriaxone and azithromycin.  Assessment and Plan: Generalized weakness -Multifactorial including deconditioning, dehydration, and possibly infection -UA negative for pyuria -PT evaluation -Judicious IV fluids -B12--420 -TSH--3.994 -Folic acid-->40 -CK 67   Cellulitis lower extremities -Suspect the patient may have superimposed mild cellulitis -Continue ceftriaxone for now -improving erythema and edema with abx -d/c home with cephalexin x 4 more days   Pulmonary infiltrates/opacity -07/21/2023 personally reviewed- retrocardiac opacity -07/21/2023 CTA chest negative for PE; mosaic parenchymal attenuation -continue DuoNebs -continue Pulmicort -COVID-19/RSV/Flu neg -viral respiratory panel--neg -check PCT <0.10 -Urine Legionella antigen--pending -empiric azithro--received 4 days   Gluteal/buttock wound -Not infected on exam -Wound care consult appreciated   Sinus tachycardia/Atrial tachycardia -Reactive secondary to past placed dehydration and infectious process -TSH--3.994 -judicious IV fluids given without improvement -Monitor on telemetry -personally reviewed EKG--sinus, no STT change -12/04/2022 echo EF 60 to 65%, no WMA, grade 1 DD, mild decrease -07/22/23 echo--EF 55-60%, mod LVH, mild MR, indeterminant diastole, mild to mod MS -appreciate cardiology consult>>consistent with atrial tachycardia>>start metoprolol   Chronic respiratory failure with hypoxia and hypercarbia -Chronically on 4 L at home -stable   Chronic HFpEF -Clinically euvolemic -Holding torsemide temporarily -12/04/2022 echo EF 60 to 65%, no WMA, grade  1 DD, mild decrease -07/22/23 echo--EF 55-60%, mod  LVH, mild MR, indeterminant diastole, mild to mod MS   Mixed hyperlipidemia -Continue statin   Aortic stenosis status post TAVR -Underwent TAVR 06/2018   OSA -Patient poor compliant with CPAP at home -Continue in the hospital   Morbid obesity -BMI 42.78 -Lifestyle modification to     {Tip this will not be part of the note when signed Body mass index is 41.49 kg/m. , ,  Active Pressure Injury/Wound(s)     Pressure Ulcer  Duration          Pressure Injury 04/06/22 Buttocks Right Stage 3 -  Full thickness tissue loss. Subcutaneous fat may be visible but bone, tendon or muscle are NOT exposed. 473 days   Pressure Injury 05/11/22 Sacrum Mid Outter edges white, wound bed pink, 1cmx2cm 437 days   Pressure Injury 07/21/23 Anus Right Stage 2 -  Partial thickness loss of dermis presenting as a shallow open injury with a red, pink wound bed without slough. 2 days           (Optional):[id number]}   Consultants: cardiology Procedures performed: none  Disposition: Home Diet recommendation:  Cardiac diet DISCHARGE MEDICATION: Allergies as of 07/23/2023   No Known Allergies   Med Rec must be completed prior to using this [facility]***       Discharge Exam: Filed Weights   07/21/23 0930  Weight: 116.6 kg   HEENT:  /AT, No thrush, no icterus CV:  RRR, no rub, no S3, no S4 Lung:  bibasilar crackles.  No wheeze Abd:  soft/+BS, NT Ext:  trace LE edema, no lymphangitis, no synovitis, no rash   Condition at discharge: stable  The results of significant diagnostics from this hospitalization (including imaging, microbiology, ancillary and laboratory) are listed below for reference.   Imaging Studies: ECHOCARDIOGRAM COMPLETE Result Date: 07/23/2023    ECHOCARDIOGRAM REPORT   Patient Name:   Dawn Gonzales Date of Exam: 07/22/2023 Medical Rec #:  [id number]             Height:       66.0 in Accession #:    [id number]             Weight:       257.1 lb Date of Birth:  Sep 17, 1940             BSA:          2.225 m Patient Age:    83 years              BP:           120/61 mmHg Patient Gender: F                     HR:           107 bpm. [facility] Procedure: 2D Echo, Cardiac Doppler and Color Doppler (Both Spectral and Color            Flow Doppler were utilized during procedure). Indications:    Dyspnea  History:        Patient has prior history of Echocardiogram examinations, most                 recent 12/04/2022. CHF, Pacemaker, TIA, Arrythmias:Bradycardia and                 RBBB, Signs/Symptoms:Dyspnea; Risk Factors:Hypertension, Sleep                 Apnea, Dyslipidemia and Former Smoker.  There is a 26 mm Sapien                 stented prosthetic valve in the Aortic position. Procedure date                 March 2020.                 Aortic Valve: 26 mm Sapien prosthetic, stented (TAVR) valve is                 present in the aortic position.  Sonographer:    Mikki Harbor Referring Phys: [phone] Anaiyah Anglemyer  Sonographer Comments: Image acquisition challenging due to respiratory motion. IMPRESSIONS  1. Left ventricular ejection fraction, by estimation, is 55 to 60%. The left ventricle has normal function. Left ventricular endocardial border not optimally defined to evaluate regional wall motion. There is moderate left ventricular hypertrophy. Indeterminate diastolic filling due to E-A fusion.  2. Right ventricular systolic function is normal. The right ventricular size is normal.  3. Left atrial size was severely dilated.  4. The mitral valve is normal in structure. Mild mitral valve regurgitation. Mild to moderate mitral stenosis in the setting of tachycardia. The mean mitral valve gradient is 7.0 mmHg. Severe mitral annular calcification.  5. The aortic valve has been repaired/replaced. Aortic valve regurgitation is trivial. There is a 26 mm Sapien prosthetic (TAVR) valve present in the aortic position. Aortic valve  mean gradient measures 7.7 mmHg.  6. The inferior vena cava is dilated in size with >50% respiratory variability, suggesting right atrial pressure of 8 mmHg.  7. Increased flow velocities may be secondary to anemia, thyrotoxicosis, hyperdynamic or high flow state. Comparison(s): Changes from prior study are noted. Mild to moderate MS is new but gradients are likely exaggerated in the setting of tachycardia. FINDINGS  Left Ventricle: Left ventricular ejection fraction, by estimation, is 55 to 60%. The left ventricle has normal function. Left ventricular endocardial border not optimally defined to evaluate regional wall motion. Strain was performed and the global longitudinal strain is indeterminate. The left ventricular internal cavity size was normal in size. There is moderate left ventricular hypertrophy. Indeterminate diastolic filling due to E-A fusion. Right Ventricle: The right ventricular size is normal. No increase in right ventricular wall thickness. Right ventricular systolic function is normal. Left Atrium: Left atrial size was severely dilated. Right Atrium: Right atrial size was normal in size. Pericardium: There is no evidence of pericardial effusion. Mitral Valve: The mitral valve is normal in structure. Severe mitral annular calcification. Mild mitral valve regurgitation. 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 MV peak gradient, 14.7 mmHg. The mean mitral valve gradient is 7.0 mmHg. Tricuspid Valve: The tricuspid valve is normal in structure. Tricuspid valve regurgitation is not demonstrated. No evidence of tricuspid stenosis. Aortic Valve: The aortic valve has been repaired/replaced. Aortic valve regurgitation is trivial. Aortic valve mean gradient measures 7.7 mmHg. Aortic valve peak gradient measures 16.8 mmHg. Aortic valve area, by VTI measures 2.60 cm. There is a 26 mm Sapien prosthetic, stented (TAVR) valve present in the aortic position. Pulmonic Valve: The pulmonic valve was grossly normal.  Pulmonic valve regurgitation is trivial. No evidence of pulmonic stenosis. Aorta: The aortic root is normal in size and structure. Venous: The inferior vena cava is dilated in size with greater than 50% respiratory variability, suggesting right atrial pressure of 8 mmHg. IAS/Shunts: No atrial level shunt detected by color flow Doppler. Additional Comments: 3D was performed not requiring image  post processing on an independent workstation and was indeterminate. A device lead is visualized.  LEFT VENTRICLE PLAX 2D LVIDd:         4.70 cm LVIDs:         2.40 cm LV PW:         1.60 cm LV IVS:        1.60 cm LVOT diam:     2.00 cm LV SV:         87 LV SV Index:   39 LVOT Area:     3.14 cm  LV Volumes (MOD) LV vol d, MOD A2C: 47.1 ml LV vol d, MOD A4C: 47.0 ml LV vol s, MOD A2C: 27.7 ml LV vol s, MOD A4C: 20.7 ml LV SV MOD A2C:     19.4 ml LV SV MOD A4C:     47.0 ml LV SV MOD BP:      23.8 ml RIGHT VENTRICLE RV Basal diam:  3.45 cm RV Mid diam:    3.80 cm LEFT ATRIUM              Index        RIGHT ATRIUM           Index LA diam:        5.10 cm  2.29 cm/m   RA Area:     17.20 cm LA Vol (A2C):   173.0 ml 77.76 ml/m  RA Volume:   46.00 ml  20.68 ml/m LA Vol (A4C):   125.0 ml 56.18 ml/m LA Biplane Vol: 151.0 ml 67.87 ml/m  AORTIC VALVE                     PULMONIC VALVE AV Area (Vmax):    2.63 cm      PV Vmax:       0.98 m/s AV Area (Vmean):   2.64 cm      PV Peak grad:  3.8 mmHg AV Area (VTI):     2.60 cm AV Vmax:           205.00 cm/s AV Vmean:          128.667 cm/s AV VTI:            0.335 m AV Peak Grad:      16.8 mmHg AV Mean Grad:      7.7 mmHg LVOT Vmax:         171.33 cm/s LVOT Vmean:        108.000 cm/s LVOT VTI:          0.277 m LVOT/AV VTI ratio: 0.83  AORTA Ao Root diam: 4.10 cm MITRAL VALVE MV Area (PHT): 6.02 cm     SHUNTS MV Area VTI:   3.31 cm     Systemic VTI:  0.28 m MV Peak grad:  14.7 mmHg    Systemic Diam: 2.00 cm MV Mean grad:  7.0 mmHg MV Vmax:       1.92 m/s MV Vmean:      118.0 cm/s MV  Decel Time: 126 msec MV E velocity: 143.00 cm/s Vishnu Priya Mallipeddi Electronically signed by Winfield Rast Mallipeddi Signature Date/Time: 07/23/2023/2:39:32 PM    Final    CT Angio Chest PE W and/or Wo Contrast Result Date: 07/21/2023 CLINICAL DATA:  Tachycardia.  Possible pulmonary embolus. EXAM: CT ANGIOGRAPHY CHEST WITH CONTRAST TECHNIQUE: Multidetector CT imaging of the chest was performed using the standard protocol during bolus administration of intravenous contrast. Multiplanar CT image reconstructions  and MIPs were obtained to evaluate the vascular anatomy. RADIATION DOSE REDUCTION: This exam was performed according to the departmental dose-optimization program which includes automated exposure control, adjustment of the mA and/or kV according to patient size and/or use of iterative reconstruction technique. CONTRAST:  75mL OMNIPAQUE IOHEXOL 350 MG/ML SOLN COMPARISON:  04/05/2022. FINDINGS: Cardiovascular: Negative for pulmonary embolus. Atherosclerotic calcification of the aorta and coronary arteries. Aortic valve replacement. Enlarged pulmonic trunk and heart. No pericardial effusion. Mediastinum/Nodes: No pathologically enlarged mediastinal, hilar or axillary lymph nodes. Esophagus is grossly unremarkable. Lungs/Pleura: Image quality is somewhat degraded by expiratory phase imaging and respiratory motion. Mosaic pulmonary parenchymal attenuation, as before. Chronic atelectasis in the right lung base, adjacent to an elevated right hemidiaphragm. No pleural fluid. Airway is unremarkable. Upper Abdomen: Chronic pneumobilia. Subcentimeter lesions in the kidneys, too small to characterize. No specific follow-up necessary. Probable splenic cyst. Visualized portions of the liver, gallbladder, adrenal glands, kidneys, spleen, pancreas, stomach and bowel are otherwise grossly unremarkable. No upper abdominal adenopathy. Musculoskeletal: Degenerative changes in the spine. Review of the MIP images confirms the  above findings. IMPRESSION: 1. Negative for pulmonary embolus. 2. Mosaic pulmonary parenchymal attenuation is nonspecific but can be seen in the setting of small airways disease. 3. Aortic atherosclerosis (ICD10-I70.0). Coronary artery calcification. 4. Enlarged pulmonic trunk, indicative of pulmonary arterial hypertension. Electronically Signed   By: Leanna Battles M.D.   On: 07/21/2023 13:47   DG Chest Port 1 View Result Date: 07/21/2023 CLINICAL DATA:  Sepsis EXAM: PORTABLE CHEST 1 VIEW COMPARISON:  Chest radiograph dated 05/09/2022 FINDINGS: Lines/tubes: Left chest wall pacemaker leads project over the right atrium and ventricle. Lungs: Low lung volumes with bronchovascular crowding. Unchanged right lower lung linear opacity and increased dense left retrocardiac opacity. Pleura: No pneumothorax or definite pleural effusion. Heart/mediastinum: Similar markedly enlarged cardiomediastinal silhouette. Bones: No acute osseous abnormality. IMPRESSION: 1. Low lung volumes with bronchovascular crowding. Unchanged right lower lung linear opacity and increased dense left retrocardiac opacity, which may represent atelectasis, aspiration, or pneumonia. 2. Similar markedly enlarged cardiomediastinal silhouette. Electronically Signed   By: Agustin Cree M.D.   On: 07/21/2023 10:54   CUP PACEART REMOTE DEVICE CHECK Result Date: 07/10/2023 Scheduled remote reviewed. Normal device function.  AT detections noted in the last week, EGMs reviewed on website consistent with AFL.  Overall AF burden <1%, but was increased up to almost 100% last week, corresponding to increase in v. rate from 80 bpm to > 100 bpm.  On ASA, no OAC and no h/o AF documented per Epic. Routing to triage for further review of increase in atrial arrhythmia burden from previous with no OAC per protocol. Next remote 91 days. - CS, CVRS   Microbiology: Results for orders placed or performed during the hospital encounter of 07/21/23  Resp panel by RT-PCR  (RSV, Flu A&B, Covid) Anterior Nasal Swab     Status: None   Collection Time: 07/21/23  9:35 AM   Specimen: Anterior Nasal Swab  Result Value Ref Range Status   SARS Coronavirus 2 by RT PCR NEGATIVE NEGATIVE Final    Comment: (NOTE) SARS-CoV-2 target nucleic acids are NOT DETECTED.  The SARS-CoV-2 RNA is generally detectable in upper respiratory specimens during the acute phase of infection. The lowest concentration of SARS-CoV-2 viral copies this assay can detect is 138 copies/mL. A negative result does not preclude SARS-Cov-2 infection and should not be used as the sole basis for treatment or other patient management decisions. A negative result may occur with  improper specimen collection/handling, submission of specimen other than nasopharyngeal swab, presence of viral mutation(s) within the areas targeted by this assay, and inadequate number of viral copies(<138 copies/mL). A negative result must be combined with clinical observations, patient history, and epidemiological information. The expected result is Negative.  Fact Sheet for Patients:  BloggerCourse.com  Fact Sheet for Healthcare Providers:  SeriousBroker.it  This test is no t yet approved or cleared by the [place] FDA and  has been authorized for detection and/or diagnosis of SARS-CoV-2 by FDA under an Emergency Use Authorization (EUA). This EUA will remain  in effect (meaning this test can be used) for the duration of the COVID-19 declaration under Section 564(b)(1) of the Act, 21 U.S.C.section 360bbb-3(b)(1), unless the authorization is terminated  or revoked sooner.       Influenza A by PCR NEGATIVE NEGATIVE Final   Influenza B by PCR NEGATIVE NEGATIVE Final    Comment: (NOTE) The Xpert Xpress SARS-CoV-2/FLU/RSV plus assay is intended as an aid in the diagnosis of influenza from Nasopharyngeal swab specimens and should not be used as a sole basis for  treatment. Nasal washings and aspirates are unacceptable for Xpert Xpress SARS-CoV-2/FLU/RSV testing.  Fact Sheet for Patients: BloggerCourse.com  Fact Sheet for Healthcare Providers: SeriousBroker.it  This test is not yet approved or cleared by the [place] FDA and has been authorized for detection and/or diagnosis of SARS-CoV-2 by FDA under an Emergency Use Authorization (EUA). This EUA will remain in effect (meaning this test can be used) for the duration of the COVID-19 declaration under Section 564(b)(1) of the Act, 21 U.S.C. section 360bbb-3(b)(1), unless the authorization is terminated or revoked.     Resp Syncytial Virus by PCR NEGATIVE NEGATIVE Final    Comment: (NOTE) Fact Sheet for Patients: BloggerCourse.com  Fact Sheet for Healthcare Providers: SeriousBroker.it  This test is not yet approved or cleared by the [place] FDA and has been authorized for detection and/or diagnosis of SARS-CoV-2 by FDA under an Emergency Use Authorization (EUA). This EUA will remain in effect (meaning this test can be used) for the duration of the COVID-19 declaration under Section 564(b)(1) of the Act, 21 U.S.C. section 360bbb-3(b)(1), unless the authorization is terminated or revoked.  Performed at [facility], [address], [place], [place] [address]   Blood Culture (routine x 2)     Status: None (Preliminary result)   Collection Time: 07/21/23  9:40 AM   Specimen: BLOOD RIGHT FOREARM  Result Value Ref Range Status   Specimen Description   Final    BLOOD RIGHT FOREARM BOTTLES DRAWN AEROBIC AND ANAEROBIC   Special Requests Blood Culture adequate volume  Final   Culture   Final    NO GROWTH 2 DAYS Performed at [facility], [address] [place], [place] [address]    Report Status PENDING  Incomplete  Blood Culture (routine x 2)     Status: None (Preliminary result)    Collection Time: 07/21/23 10:05 AM   Specimen: BLOOD RIGHT HAND  Result Value Ref Range Status   Specimen Description   Final    BLOOD RIGHT HAND BOTTLES DRAWN AEROBIC AND ANAEROBIC   Special Requests Blood Culture adequate volume  Final   Culture   Final    NO GROWTH 2 DAYS Performed at [facility], [address], [place], [place] [address]    Report Status PENDING  Incomplete  Respiratory (~20 pathogens) panel by PCR     Status: None   Collection Time:  07/21/23  4:22 PM   Specimen: Nasopharyngeal Swab; Respiratory  Result Value Ref Range Status   Adenovirus NOT DETECTED NOT DETECTED Final   Coronavirus 229E NOT DETECTED NOT DETECTED Final    Comment: (NOTE) The Coronavirus on the Respiratory Panel, DOES NOT test for the novel  Coronavirus (2019 nCoV)    Coronavirus HKU1 NOT DETECTED NOT DETECTED Final   Coronavirus NL63 NOT DETECTED NOT DETECTED Final   Coronavirus OC43 NOT DETECTED NOT DETECTED Final   Metapneumovirus NOT DETECTED NOT DETECTED Final   Rhinovirus / Enterovirus NOT DETECTED NOT DETECTED Final   Influenza A NOT DETECTED NOT DETECTED Final   Influenza B NOT DETECTED NOT DETECTED Final   Parainfluenza Virus 1 NOT DETECTED NOT DETECTED Final   Parainfluenza Virus 2 NOT DETECTED NOT DETECTED Final   Parainfluenza Virus 3 NOT DETECTED NOT DETECTED Final   Parainfluenza Virus 4 NOT DETECTED NOT DETECTED Final   Respiratory Syncytial Virus NOT DETECTED NOT DETECTED Final   Bordetella pertussis NOT DETECTED NOT DETECTED Final   Bordetella Parapertussis NOT DETECTED NOT DETECTED Final   Chlamydophila pneumoniae NOT DETECTED NOT DETECTED Final   Mycoplasma pneumoniae NOT DETECTED NOT DETECTED Final    Comment: Performed at [facility], [address], [place], [place] [address]    Labs: CBC: Recent Labs  Lab 07/21/23 0940 07/22/23 0348 07/23/23 0513  WBC 8.6 7.1 7.0  NEUTROABS 5.6  --   --   HGB 13.4 12.3 11.2*  HCT 42.4 39.5 36.4  MCV 94.2 95.6  95.5  PLT 287 250 258   Basic Metabolic Panel: Recent Labs  Lab 07/21/23 0940 07/21/23 1741 07/22/23 0348 07/23/23 0513  NA 140  --  140 138  K 4.0  --  3.6 4.2  CL 92*  --  99 97*  CO2 36*  --  30 32  GLUCOSE 104*  --  87 103*  BUN 52*  --  40* 26*  CREATININE 0.96  --  0.98 0.84  CALCIUM 9.8  --  9.1 8.9  MG  --  2.7*  --  2.6*   Liver Function Tests: Recent Labs  Lab 07/21/23 0940  AST 23  ALT 19  ALKPHOS 72  BILITOT 0.7  PROT 8.1  ALBUMIN 4.1   CBG: No results for input(s): "GLUCAP" in the last 168 hours.  Discharge time spent: greater than 30 minutes.  Signed: Catarina Hartshorn, MD [facility] 07/23/2023

## 2023-07-23 NOTE — Evaluation (Signed)
 Physical Therapy Evaluation Patient Details Name: Dawn Gonzales MRN: [id number] DOB: 03/18/1941 Today's Date: 07/23/2023  History of Present Illness  Dawn Gonzales is a 83 year old female with a history of hypertension, hyperlipidemia, aortic stenosis status post TAVR 06/2018,HFpEF, chronic respiratory failure on 4 L nasal cannula, OSA on CPAP, restrictive lung disease, complete heart block status post PPM, CVA presenting with generalized weakness and tachycardia.  At baseline, the patient lives by herself.  She is able to perform ADLs with no assistance.  She ambulates with a walker.  The patient went to see her PCP on 07/04/2023 secondary to tachycardia and some generalized fatigue.  Patient was initially placed on doxycycline for concerns of skin tear on her buttocks being possibly infected.  On 07/09/2023, her PCP office called back and placed the patient on Bactrim.  Patient finished a 10-day course of doxycycline 7-day course of Bactrim DS.  Her tachycardia improved back to baseline with heart rate in the 70s.  However, 3 days later the patient once again began developing some tachycardia with heart rate 110-120s.  Patient states that she has been feeling "sluggish" for the past 3 days.  She denies any headache, neck pain, fevers, chills, chest pain, nausea, vomiting or diarrhea, abdominal pain.  She denies any dysuria or hematuria.  She has had some dyspnea on exertion and nonproductive cough.  Besides the antibiotics described above, there is been no new medications.  Because of her generalized weakness and tachycardia, the patient was brought to emergency for further evaluation and treatment.  In the ED, patient is afebrile and hemodynamically stable with oxygen saturation 93% on 3 L.  WBC 8.6, hemoglobin 13.4, platelets 287.  3140, potassium 4.0, bicarbonate 36, serum creatinine 0.96.  LFTs were unremarkable.  Lactic acid 1.0>> 1.1.  CTA chest was negative for PE.  There was mosaic  parenchymal attenuation.  There is enlarged pulmonary trunk consistent with pulmonary hypertension.  There is chronic right lower lobe atelectasis.  The patient was given started ceftriaxone and azithromycin.   Clinical Impression  Patient functioning near baseline for functional mobility and gait with good return for using bed rail for sitting up bedside with HOB fully raised, requires increased time for completing sit to stands, but once on feet using RW able to safely take a few steps at bedside and transfer to chair.  Patient limited mostly due to fatigue and mild SOB with SpO2 at 93% while resting in chair. Patient will benefit from continued skilled physical therapy in hospital and recommended venue below to increase strength, balance, endurance for safe ADLs and gait.          If plan is discharge home, recommend the following: A little help with walking and/or transfers;A little help with bathing/dressing/bathroom;Help with stairs or ramp for entrance;Assistance with cooking/housework   Can travel by private vehicle        Equipment Recommendations None recommended by PT  Recommendations for Other Services       Functional Status Assessment Patient has had a recent decline in their functional status and/or demonstrates limited ability to make significant improvements in function in a reasonable and predictable amount of time     Precautions / Restrictions Precautions Precautions: Fall Restrictions Weight Bearing Restrictions Per Provider Order: No      Mobility  Bed Mobility Overal bed mobility: Needs Assistance Bed Mobility: Supine to Sit     Supine to sit: Supervision, HOB elevated     General bed mobility comments: had to  use bed rails for pulling self to sitting    Transfers Overall transfer level: Needs assistance Equipment used: Rolling walker (2 wheels) Transfers: Sit to/from Stand, Bed to chair/wheelchair/BSC Sit to Stand: Contact guard assist   Step  pivot transfers: Contact guard assist       General transfer comment: required increased time for completing sit to stands    Ambulation/Gait Ambulation/Gait assistance: Contact guard assist Gait Distance (Feet): 6 Feet Assistive device: Rolling walker (2 wheels) Gait Pattern/deviations: Decreased step length - left, Decreased stance time - right, Decreased stride length Gait velocity: slow     General Gait Details: limited to a few slow labored steps at bedside having to lean over RW for support not using handles, but leaned on front bar, no loss of balance, limited mostly due to fatigue  Stairs            Wheelchair Mobility     Tilt Bed    Modified Rankin (Stroke Patients Only)       Balance Overall balance assessment: Needs assistance Sitting-balance support: Feet supported, No upper extremity supported Sitting balance-Leahy Scale: Good Sitting balance - Comments: seated at EOB   Standing balance support: Reliant on assistive device for balance, During functional activity, Bilateral upper extremity supported Standing balance-Leahy Scale: Fair Standing balance comment: using RW                             Pertinent Vitals/Pain Pain Assessment Pain Assessment: No/denies pain    Home Living Family/patient expects to be discharged to:: Private residence Living Arrangements: Alone Available Help at Discharge: Family;Friend(s);Personal care attendant;Available PRN/intermittently Type of Home: House Home Access: Ramped entrance       Home Layout: One level Home Equipment: Agricultural consultant (2 wheels);Wheelchair - manual;Grab bars - tub/shower;Grab bars - toilet;Shower seat - built in;BSC/3in1      Prior Function Prior Level of Function : Needs assist       Physical Assist : Mobility (physical);ADLs (physical) Mobility (physical): Bed mobility;Transfers;Gait;Stairs   Mobility Comments: uses wheelchair for mobility, completes transfers from bed  to w/c, w/c to commode using RW ADLs Comments: Assisted by family, has home aides 2 days/week x 4 hours/day     Extremity/Trunk Assessment   Upper Extremity Assessment Upper Extremity Assessment: Generalized weakness    Lower Extremity Assessment Lower Extremity Assessment: Generalized weakness    Cervical / Trunk Assessment Cervical / Trunk Assessment: Normal  Communication   Communication Communication: No apparent difficulties    Cognition Arousal: Alert Behavior During Therapy: WFL for tasks assessed/performed   PT - Cognitive impairments: No apparent impairments                         Following commands: Intact       Cueing Cueing Techniques: Verbal cues     General Comments      Exercises     Assessment/Plan    PT Assessment Patient needs continued PT services  PT Problem List Decreased strength;Decreased activity tolerance;Decreased balance;Decreased mobility       PT Treatment Interventions DME instruction;Gait training;Functional mobility training;Therapeutic activities;Therapeutic exercise;Balance training;Patient/family education    PT Goals (Current goals can be found in the Care Plan section)  Acute Rehab PT Goals Patient Stated Goal: return home with family, home aides to assist PT Goal Formulation: With patient Time For Goal Achievement: 07/27/23 Potential to Achieve Goals: Good    Frequency Min  3X/week     Co-evaluation               AM-PAC PT "6 Clicks" Mobility  Outcome Measure Help needed turning from your back to your side while in a flat bed without using bedrails?: A Little Help needed moving from lying on your back to sitting on the side of a flat bed without using bedrails?: A Little Help needed moving to and from a bed to a chair (including a wheelchair)?: A Little Help needed standing up from a chair using your arms (e.g., wheelchair or bedside chair)?: A Little Help needed to walk in hospital room?: A  Little Help needed climbing 3-5 steps with a railing? : A Lot 6 Click Score: 17    End of Session Equipment Utilized During Treatment: Oxygen Activity Tolerance: Patient tolerated treatment well;Patient limited by fatigue Patient left: in chair;with call bell/phone within reach Nurse Communication: Mobility status PT Visit Diagnosis: Unsteadiness on feet (R26.81);Other abnormalities of gait and mobility (R26.89);Muscle weakness (generalized) (M62.81)    Time: 1610-9604 PT Time Calculation (min) (ACUTE ONLY): 20 min   Charges:   PT Evaluation $PT Eval Moderate Complexity: 1 Mod PT Treatments $Therapeutic Activity: 8-22 mins PT General Charges $$ ACUTE PT VISIT: 1 Visit         3:41 PM, 07/23/23 Ocie Bob, MPT Physical Therapist with [facility] [phone] office [phone] mobile phone

## 2023-07-23 NOTE — Consult Note (Addendum)
 CARDIOLOGY CONSULT NOTE    Patient ID: Dawn Gonzales; [id number]; Oct 10, 1940   Admit date: 07/21/2023 Date of Consult: 07/23/2023  Primary Care Provider: Benita Stabile, MD Primary Cardiologist:  Primary Electrophysiologist:     History of Present Illness:   Dawn Gonzales is a 83 year old F known to have history of TAVR in 2020, CHB s/p Biotronik dual-chamber PPM in 2020, chronic diastolic heart failure, HTN, HLD presented to ER with 1 week history of generalized weakness.  Patient is currently admitted to the hospitalist team for the management of cellulitis of bilateral lower extremities.  Patient reported feeling extremely weak for 1 week prior to presentation.  She did not report having any chest pain, nausea vomiting, abdominal pain, SOB or any leg swelling.  She is independent, lives and performs activities by herself.  She had history of chronic venous ulcers that became tender recently.  Currently on antibiotics.  She reported feeling significant improvement in her symptoms after receiving the treatment.  EKG showed atrial tachycardia, RBBB.  She has a pacemaker in place, reviewed the tracings from March 2025 that showed atrial tachycardia.  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   26 mm Edwards Sapien 3 transcatheter heart valve placed via percutaneous right transfemoral approach    Severe aortic stenosis    Sleep apnea     Past Surgical History:  Procedure Laterality Date   Abdominal gangrene     ADENOIDECTOMY     Cataract surgery     COLONOSCOPY      ENDOSCOPIC RETROGRADE CHOLANGIOPANCREATOGRAPHY (ERCP) WITH PROPOFOL N/A 02/08/2018   Procedure: ENDOSCOPIC RETROGRADE CHOLANGIOPANCREATOGRAPHY (ERCP) WITH PROPOFOL;  Surgeon: Lemar Lofty., MD;  Location: [facility] ENDOSCOPY;  Service: Gastroenterology;  Laterality: N/A;   EUS  02/08/2018   Procedure: UPPER ENDOSCOPIC ULTRASOUND (EUS) LINEAR;  Surgeon: Meridee Score Netty Starring., MD;  Location: [facility] ENDOSCOPY;  Service: Gastroenterology;;   EYE SURGERY     IR FLUORO RM 30-60 MIN  03/27/2018   IR PERC CHOLECYSTOSTOMY  02/09/2018   IR RADIOLOGIST EVAL & MGMT  03/26/2018   IR RADIOLOGY PERIPHERAL GUIDED IV START  05/22/2018   IR RADIOLOGY PERIPHERAL GUIDED IV START  05/30/2018   IR RADIOLOGY PERIPHERAL GUIDED IV START  06/05/2018   IR US GUIDE VASC ACCESS RIGHT  05/22/2018   IR US GUIDE VASC ACCESS RIGHT  05/30/2018   IR US GUIDE VASC ACCESS RIGHT  06/05/2018   PACEMAKER IMPLANT N/A 07/10/2018   Procedure: PACEMAKER IMPLANT;  Surgeon: Marinus Maw, MD;  Location: [facility] INVASIVE CV LAB;  Service: Cardiovascular;  Laterality: N/A;   REMOVAL OF STONES  02/08/2018   Procedure: REMOVAL OF STONES;  Surgeon: Meridee Score Netty Starring., MD;  Location: [facility] ENDOSCOPY;  Service: Gastroenterology;;   Dennison Mascot  02/08/2018   Procedure: Dennison Mascot;  Surgeon: Mansouraty, Netty Starring., MD;  Location: [facility] ENDOSCOPY;  Service: Gastroenterology;;   TONSILLECTOMY     TOOTH EXTRACTION N/A 06/18/2018   Procedure: DENTAL RESTORATION/EXTRACTIONS;  Surgeon: Barbette Merino,  Lorin Picket, DDS;  Location: [facility] OR;  Service: Oral Surgery;  Laterality: N/A;   TRANSCATHETER AORTIC VALVE REPLACEMENT, TRANSFEMORAL N/A 07/09/2018   Procedure: TRANSCATHETER AORTIC VALVE REPLACEMENT, TRANSFEMORAL;  Surgeon: Tonny Bollman, MD;  Location: [facility] INVASIVE CV LAB;  Service: Cardiovascular;  Laterality: N/A;   Tummy tuck         Inpatient Medications: Scheduled Meds:  ascorbic acid  500 mg Oral Daily   aspirin EC  81 mg Oral Q breakfast   budesonide  (PULMICORT) nebulizer solution  0.5 mg Nebulization BID   cholecalciferol  1,000 Units Oral Daily   DermaCerin   Topical BID   enoxaparin (LOVENOX) injection  40 mg Subcutaneous Q24H   ferrous sulfate  325 mg Oral Q breakfast   gabapentin  300 mg Oral TID   ipratropium-albuterol  3 mL Nebulization TID   mirabegron ER  25 mg Oral Daily   multivitamin with minerals  1 tablet Oral Daily   pravastatin  20 mg Oral Daily   venlafaxine  37.5 mg Oral Daily   Continuous Infusions:  cefTRIAXone (ROCEPHIN)  IV 2 g (07/22/23 1442)   PRN Meds: acetaminophen **OR** acetaminophen, ipratropium-albuterol, ondansetron **OR** ondansetron (ZOFRAN) IV  Allergies:   No Known Allergies  Social History:   Social History   Socioeconomic History   Marital status: Married    Spouse name: Not on file   Number of children: 3   Years of education: Not on file   Highest education level: Not on file  Occupational History   Occupation: Retired houswife  Tobacco Use   Smoking status: Former    Current packs/day: 0.00    Types: Cigarettes    Quit date: 05/23/1978    Years since quitting: 45.1    Passive exposure: Never   Smokeless tobacco: Never  Vaping Use   Vaping status: Never Used  Substance and Sexual Activity   Alcohol use: Not Currently    Comment: Occasional   Drug use: Never   Sexual activity: Not on file  Other Topics Concern   Not on file  Social History Narrative   Not on file   Social Drivers of Health   Financial Resource Strain: Not on file  Food Insecurity: No Food Insecurity (07/21/2023)   Hunger Vital Sign    Worried About Running Out of Food in the Last Year: Never true    Ran Out of Food in the Last Year: Never true  Transportation Needs: No Transportation Needs (07/21/2023)   PRAPARE - Transportation    Lack of Transportation (Medical): No    Lack of Transportation (Non-Medical): No  Physical Activity: Not on file  Stress: Not on file  Social Connections: Socially  Isolated (07/21/2023)   Social Connection and Isolation Panel [NHANES]    Frequency of Communication with Friends and Family: More than three times a week    Frequency of Social Gatherings with Friends and Family: More than three times a week    Attends Religious Services: Never    Database administrator or Organizations: No    Attends Banker Meetings: Never    Marital Status: Widowed  Intimate Partner Violence: Not At Risk (07/21/2023)   Humiliation, Afraid, Rape, and Kick questionnaire    Fear of Current or Ex-Partner: No    Emotionally Abused: No    Physically Abused: No    Sexually Abused: No    Family History:    Family History  Problem Relation Age of Onset   Cancer Mother  Hypertension Father    Arthritis Father    Heart attack Father      ROS:  Please see the history of present illness.  ROS  All other ROS reviewed and negative.     Physical Exam/Data:   Vitals:   07/23/23 0400 07/23/23 0641 07/23/23 0739 07/23/23 0931  BP: 128/79 122/64  115/66  Pulse: (!) 109 (!) 107  (!) 109  Resp:  18    Temp: 98.4 F (36.9 C) 98.6 F (37 C)  98.9 F (37.2 C)  TempSrc: Oral Oral  Oral  SpO2: 93% 93% 94% 92%  Weight:      Height:        Intake/Output Summary (Last 24 hours) at 07/23/2023 1117 Last data filed at 07/23/2023 0853 Gross per 24 hour  Intake 840 ml  Output 1200 ml  Net -360 ml   Filed Weights   07/21/23 0930  Weight: 116.6 kg   Body mass index is 41.49 kg/m.  General:  Well nourished, well developed, in no acute distress HEENT: normal Lymph: no adenopathy Neck: no JVD Endocrine:  No thryomegaly Vascular: No carotid bruits; FA pulses 2+ bilaterally without bruits  Cardiac:  normal S1, S2; RRR; no murmur Lungs:  clear to auscultation bilaterally, no wheezing, rhonchi or rales  Abd: soft, nontender, no hepatomegaly  Ext: no edema Musculoskeletal:  No deformities, BUE and BLE strength normal and equal Skin: warm and dry  Neuro:  CNs  2-12 intact, no focal abnormalities noted Psych:  Normal affect   EKG:  The EKG was personally reviewed and demonstrates:   Telemetry:  Telemetry was personally reviewed and demonstrates:    Laboratory Data:  Chemistry Recent Labs  Lab 07/21/23 0940 07/22/23 0348 07/23/23 0513  NA 140 140 138  K 4.0 3.6 4.2  CL 92* 99 97*  CO2 36* 30 32  GLUCOSE 104* 87 103*  BUN 52* 40* 26*  CREATININE 0.96 0.98 0.84  CALCIUM 9.8 9.1 8.9  GFRNONAA 59* 57* >60  ANIONGAP 12 11 9     Recent Labs  Lab 07/21/23 0940  PROT 8.1  ALBUMIN 4.1  AST 23  ALT 19  ALKPHOS 72  BILITOT 0.7   Hematology Recent Labs  Lab 07/21/23 0940 07/22/23 0348 07/23/23 0513  WBC 8.6 7.1 7.0  RBC 4.50 4.13 3.81*  HGB 13.4 12.3 11.2*  HCT 42.4 39.5 36.4  MCV 94.2 95.6 95.5  MCH 29.8 29.8 29.4  MCHC 31.6 31.1 30.8  RDW 13.8 14.0 14.1  PLT 287 250 258   Cardiac EnzymesNo results for input(s): "TROPONINI" in the last 168 hours. No results for input(s): "TROPIPOC" in the last 168 hours.  BNPNo results for input(s): "BNP", "PROBNP" in the last 168 hours.  DDimer No results for input(s): "DDIMER" in the last 168 hours.  Radiology/Studies:  CT Angio Chest PE W and/or Wo Contrast Result Date: 07/21/2023 CLINICAL DATA:  Tachycardia.  Possible pulmonary embolus. EXAM: CT ANGIOGRAPHY CHEST WITH CONTRAST TECHNIQUE: Multidetector CT imaging of the chest was performed using the standard protocol during bolus administration of intravenous contrast. Multiplanar CT image reconstructions and MIPs were obtained to evaluate the vascular anatomy. RADIATION DOSE REDUCTION: This exam was performed according to the departmental dose-optimization program which includes automated exposure control, adjustment of the mA and/or kV according to patient size and/or use of iterative reconstruction technique. CONTRAST:  75mL OMNIPAQUE IOHEXOL 350 MG/ML SOLN COMPARISON:  04/05/2022. FINDINGS: Cardiovascular: Negative for pulmonary embolus.  Atherosclerotic calcification of the aorta and coronary  arteries. Aortic valve replacement. Enlarged pulmonic trunk and heart. No pericardial effusion. Mediastinum/Nodes: No pathologically enlarged mediastinal, hilar or axillary lymph nodes. Esophagus is grossly unremarkable. Lungs/Pleura: Image quality is somewhat degraded by expiratory phase imaging and respiratory motion. Mosaic pulmonary parenchymal attenuation, as before. Chronic atelectasis in the right lung base, adjacent to an elevated right hemidiaphragm. No pleural fluid. Airway is unremarkable. Upper Abdomen: Chronic pneumobilia. Subcentimeter lesions in the kidneys, too small to characterize. No specific follow-up necessary. Probable splenic cyst. Visualized portions of the liver, gallbladder, adrenal glands, kidneys, spleen, pancreas, stomach and bowel are otherwise grossly unremarkable. No upper abdominal adenopathy. Musculoskeletal: Degenerative changes in the spine. Review of the MIP images confirms the above findings. IMPRESSION: 1. Negative for pulmonary embolus. 2. Mosaic pulmonary parenchymal attenuation is nonspecific but can be seen in the setting of small airways disease. 3. Aortic atherosclerosis (ICD10-I70.0). Coronary artery calcification. 4. Enlarged pulmonic trunk, indicative of pulmonary arterial hypertension. Electronically Signed   By: Leanna Battles M.D.   On: 07/21/2023 13:47   DG Chest Port 1 View Result Date: 07/21/2023 CLINICAL DATA:  Sepsis EXAM: PORTABLE CHEST 1 VIEW COMPARISON:  Chest radiograph dated 05/09/2022 FINDINGS: Lines/tubes: Left chest wall pacemaker leads project over the right atrium and ventricle. Lungs: Low lung volumes with bronchovascular crowding. Unchanged right lower lung linear opacity and increased dense left retrocardiac opacity. Pleura: No pneumothorax or definite pleural effusion. Heart/mediastinum: Similar markedly enlarged cardiomediastinal silhouette. Bones: No acute osseous abnormality.  IMPRESSION: 1. Low lung volumes with bronchovascular crowding. Unchanged right lower lung linear opacity and increased dense left retrocardiac opacity, which may represent atelectasis, aspiration, or pneumonia. 2. Similar markedly enlarged cardiomediastinal silhouette. Electronically Signed   By: Agustin Cree M.D.   On: 07/21/2023 10:54    Assessment and Plan:   Atrial tachycardia: Tracings reviewed from remote pacemaker data from March 2025 that showed evidence of atrial tachycardia.  EKG this admission showed possible atrial tachycardia as well, likely exacerbated by underlying cellulitis.  Currently not on any rate controlling agents, neither at home.  Will start metoprolol titrate 25 mg twice daily for rate control.  Goal HR less than 120 in the setting of infection.  Volume optimized, does not appear to be overloaded.  Echocardiogram is pending from yesterday.  Cellulitis of bilateral lower EXTR is: Currently on ceftriaxone, management per primary team.  History of severe aortic valve stenosis s/p TAVR in 2020: Echocardiogram pending from yesterday.  History of CHB s/p Biotronik dual-chamber PPM in 2020: Recent remote device interrogation revealed atrial tachycardia.  Currently not on any rate controlling agents.   Chronic diastolic heart failure, compensated: Appears to be compensated on physical exam.   For questions or updates, please contact [facility] Please consult [email or web address] for contact info under Cardiology/STEMI.   Signed, Herbert Deaner, MD 07/23/2023 11:17 AM

## 2023-07-23 NOTE — Progress Notes (Signed)
 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 At baseline, the patient lives by herself.  She is able to perform ADLs with no assistance.  She ambulates with a walker. The patient went to see her PCP on 07/04/2023 secondary to tachycardia and some generalized fatigue.  Patient was initially placed on doxycycline for concerns of skin tear on her buttocks being possibly infected.  On 07/09/2023, her PCP office called back and placed the patient on Bactrim.  Patient finished a 10-day course of doxycycline 7-day course of Bactrim DS.  Her tachycardia improved back to baseline with heart rate in the 70s.  However, 3 days later the patient once again began developing some tachycardia with heart rate 110-120s.  Patient states that she has been feeling "sluggish" for the past 3 days.  She denies any headache, neck pain, fevers, chills, chest pain, nausea, vomiting or diarrhea, abdominal pain.  She denies any dysuria or hematuria.  She has had some dyspnea on exertion and nonproductive cough.  Besides the antibiotics described above, there is been no new medications.  Because of her generalized weakness and tachycardia, the patient was brought to emergency for further evaluation and treatment. In the ED, patient is afebrile and hemodynamically stable with oxygen saturation 93% on 3 L.  WBC 8.6, hemoglobin 13.4, platelets 287.  3140, potassium 4.0, bicarbonate 36, serum creatinine 0.96.  LFTs were unremarkable.  Lactic acid 1.0>> 1.1.  CTA chest was negative for PE.  There was mosaic parenchymal attenuation.  There is enlarged pulmonary  trunk consistent with pulmonary hypertension.  There is chronic right lower lobe atelectasis.  The patient was given started ceftriaxone and azithromycin.   Assessment/Plan:  Generalized weakness -Multifactorial including deconditioning, dehydration, and possibly infection -UA negative for pyuria -PT evaluation -Judicious IV fluids -B12--420 -TSH--3.994 -Folic acid-->40 -CK 67   Cellulitis lower extremities -Suspect the patient may have superimposed mild cellulitis -Continue ceftriaxone for now -improving   Pulmonary infiltrates/opacity -07/21/2023 personally reviewed- retrocardiac opacity -07/21/2023 CTA chest negative for PE; mosaic parenchymal attenuation -continue DuoNebs -continue Pulmicort -COVID-19/RSV/Flu neg -viral respiratory panel--neg -check PCT <0.10 -Urine Legionella antigen--pending -empiric azithro   Gluteal/buttock wound -Not infected on exam -Wound care consult appreciated   Sinus tachycardia/Atrial tachycardia -Reactive secondary to past placed dehydration and infectious process -TSH--3.994 -judicious IV fluids -Monitor on telemetry -personally reviewed EKG--sinus, no STT change -12/04/2022 echo EF 60 to 65%, no WMA, grade 1 DD, mild decrease -07/22/23 echo--EF 55-60%, mod LVH, mild MR, indeterminant diastole, mild to mod MS -appreciate cardiology consult>>start metoprolol   Chronic respiratory failure with hypoxia and hypercarbia -Chronically on 4 L at home   Chronic HFpEF -Clinically euvolemic -Holding torsemide temporarily -12/04/2022 echo EF 60 to 65%, no WMA, grade 1 DD, mild decrease -07/22/23 echo--EF 55-60%, mod LVH, mild MR, indeterminant diastole, mild to mod MS   Mixed hyperlipidemia -Continue statin   Aortic stenosis status post TAVR -Underwent TAVR 06/2018   OSA -Patient poor compliant with CPAP at home -Continue in the hospital   Morbid obesity -BMI 42.78 -Lifestyle modification to       Family Communication:  daughter  updated at bedside 3/24  Consultants:  none   Code Status:  FULL    DVT Prophylaxis:  [place] Lovenox     Procedures: As Listed in Progress Note Above   Antibiotics: Azithro 3/22>> Ceftriaxone 3/22>>           Subjective: Patient denies fevers, chills, headache, chest pain, dyspnea, nausea, vomiting, diarrhea, abdominal pain, dysuria, hematuria, hematochezia, and melena.   Objective: Vitals:   07/23/23 0641 07/23/23 0739 07/23/23 0931 07/23/23 1314  BP: 122/64  115/66 124/68  Pulse: (!) 107  (!) 109 (!) 109  Resp: 18     Temp: 98.6 F (37 C)  98.9 F (37.2 C) 98.7 F (37.1 C)  TempSrc: Oral  Oral Oral  SpO2: 93% 94% 92% 93%  Weight:      Height:        Intake/Output Summary (Last 24 hours) at 07/23/2023 1455 Last data filed at 07/23/2023 0853 Gross per 24 hour  Intake 840 ml  Output 1200 ml  Net -360 ml   Weight change:  Exam:  General:  Pt is alert, follows commands appropriately, not in acute distress HEENT: No icterus, No thrush, No neck mass, /AT Cardiovascular: RRR, S1/S2, no rubs, no gallops Respiratory: bibasilar crackles. No wheeze Abdomen: Soft/+BS, non tender, non distended, no guarding Extremities: No edema, No lymphangitis, No petechiae, No rashes, no synovitis   Data Reviewed: I have personally reviewed following labs and imaging studies Basic Metabolic Panel: Recent Labs  Lab 07/21/23 0940 07/21/23 1741 07/22/23 0348 07/23/23 0513  NA 140  --  140 138  K 4.0  --  3.6 4.2  CL 92*  --  99 97*  CO2 36*  --  30 32  GLUCOSE 104*  --  87 103*  BUN 52*  --  40* 26*  CREATININE 0.96  --  0.98 0.84  CALCIUM 9.8  --  9.1 8.9  MG  --  2.7*  --  2.6*   Liver Function Tests: Recent Labs  Lab 07/21/23 0940  AST 23  ALT 19  ALKPHOS 72  BILITOT 0.7  PROT 8.1  ALBUMIN 4.1   No results for input(s): "LIPASE", "AMYLASE" in the last 168 hours. No results for input(s): "AMMONIA" in the last 168 hours. Coagulation Profile: Recent  Labs  Lab 07/21/23 0940  INR 1.0   CBC: Recent Labs  Lab 07/21/23 0940 07/22/23 0348 07/23/23 0513  WBC 8.6 7.1 7.0  NEUTROABS 5.6  --   --   HGB 13.4 12.3 11.2*  HCT 42.4 39.5 36.4  MCV 94.2 95.6 95.5  PLT 287 250 258   Cardiac Enzymes: Recent Labs  Lab 07/21/23 1741  CKTOTAL 67   BNP: Invalid input(s): "POCBNP" CBG: No results for input(s): "GLUCAP" in the last 168 hours. HbA1C: No results for input(s): "HGBA1C" in the last 72 hours. Urine analysis:    Component Value Date/Time   COLORURINE STRAW (A) 07/21/2023 1023   APPEARANCEUR CLEAR 07/21/2023 1023   LABSPEC 1.009 07/21/2023 1023   PHURINE 6.0 07/21/2023 1023   GLUCOSEU NEGATIVE 07/21/2023 1023   HGBUR NEGATIVE 07/21/2023 1023   BILIRUBINUR NEGATIVE 07/21/2023 1023   KETONESUR NEGATIVE 07/21/2023 1023   PROTEINUR NEGATIVE 07/21/2023 1023   NITRITE NEGATIVE 07/21/2023 1023   LEUKOCYTESUR NEGATIVE 07/21/2023 1023   Sepsis Labs: @LABRCNTIP (procalcitonin:4,lacticidven:4) ) Recent Results (from the past 240 hours)  Resp panel by RT-PCR (RSV, Flu A&B, Covid) Anterior Nasal Swab     Status: None   Collection Time: 07/21/23  9:35 AM  Specimen: Anterior Nasal Swab  Result Value Ref Range Status   SARS Coronavirus 2 by RT PCR NEGATIVE NEGATIVE Final    Comment: (NOTE) SARS-CoV-2 target nucleic acids are NOT DETECTED.  The SARS-CoV-2 RNA is generally detectable in upper respiratory specimens during the acute phase of infection. The lowest concentration of SARS-CoV-2 viral copies this assay can detect is 138 copies/mL. A negative result does not preclude SARS-Cov-2 infection and should not be used as the sole basis for treatment or other patient management decisions. A negative result may occur with  improper specimen collection/handling, submission of specimen other than nasopharyngeal swab, presence of viral mutation(s) within the areas targeted by this assay, and inadequate number of viral copies(<138  copies/mL). A negative result must be combined with clinical observations, patient history, and epidemiological information. The expected result is Negative.  Fact Sheet for Patients:  BloggerCourse.com  Fact Sheet for Healthcare Providers:  SeriousBroker.it  This test is no t yet approved or cleared by the [place] FDA and  has been authorized for detection and/or diagnosis of SARS-CoV-2 by FDA under an Emergency Use Authorization (EUA). This EUA will remain  in effect (meaning this test can be used) for the duration of the COVID-19 declaration under Section 564(b)(1) of the Act, 21 U.S.C.section 360bbb-3(b)(1), unless the authorization is terminated  or revoked sooner.       Influenza A by PCR NEGATIVE NEGATIVE Final   Influenza B by PCR NEGATIVE NEGATIVE Final    Comment: (NOTE) The Xpert Xpress SARS-CoV-2/FLU/RSV plus assay is intended as an aid in the diagnosis of influenza from Nasopharyngeal swab specimens and should not be used as a sole basis for treatment. Nasal washings and aspirates are unacceptable for Xpert Xpress SARS-CoV-2/FLU/RSV testing.  Fact Sheet for Patients: BloggerCourse.com  Fact Sheet for Healthcare Providers: SeriousBroker.it  This test is not yet approved or cleared by the [place] FDA and has been authorized for detection and/or diagnosis of SARS-CoV-2 by FDA under an Emergency Use Authorization (EUA). This EUA will remain in effect (meaning this test can be used) for the duration of the COVID-19 declaration under Section 564(b)(1) of the Act, 21 U.S.C. section 360bbb-3(b)(1), unless the authorization is terminated or revoked.     Resp Syncytial Virus by PCR NEGATIVE NEGATIVE Final    Comment: (NOTE) Fact Sheet for Patients: BloggerCourse.com  Fact Sheet for Healthcare  Providers: SeriousBroker.it  This test is not yet approved or cleared by the [place] FDA and has been authorized for detection and/or diagnosis of SARS-CoV-2 by FDA under an Emergency Use Authorization (EUA). This EUA will remain in effect (meaning this test can be used) for the duration of the COVID-19 declaration under Section 564(b)(1) of the Act, 21 U.S.C. section 360bbb-3(b)(1), unless the authorization is terminated or revoked.  Performed at [facility], [address], [place], [place] [address]   Blood Culture (routine x 2)     Status: None (Preliminary result)   Collection Time: 07/21/23  9:40 AM   Specimen: BLOOD RIGHT FOREARM  Result Value Ref Range Status   Specimen Description   Final    BLOOD RIGHT FOREARM BOTTLES DRAWN AEROBIC AND ANAEROBIC   Special Requests Blood Culture adequate volume  Final   Culture   Final    NO GROWTH 2 DAYS Performed at [facility], [address] [place], [place] [address]    Report Status PENDING  Incomplete  Blood Culture (routine x 2)     Status: None (Preliminary result)  Collection Time: 07/21/23 10:05 AM   Specimen: BLOOD RIGHT HAND  Result Value Ref Range Status   Specimen Description   Final    BLOOD RIGHT HAND BOTTLES DRAWN AEROBIC AND ANAEROBIC   Special Requests Blood Culture adequate volume  Final   Culture   Final    NO GROWTH 2 DAYS Performed at [facility], [address] [place], [place] [address]    Report Status PENDING  Incomplete  Respiratory (~20 pathogens) panel by PCR     Status: None   Collection Time: 07/21/23  4:22 PM   Specimen: Nasopharyngeal Swab; Respiratory  Result Value Ref Range Status   Adenovirus NOT DETECTED NOT DETECTED Final   Coronavirus 229E NOT DETECTED NOT DETECTED Final    Comment: (NOTE) The Coronavirus on the Respiratory Panel, DOES NOT test for the novel  Coronavirus (2019 nCoV)    Coronavirus HKU1 NOT DETECTED NOT DETECTED Final   Coronavirus NL63  NOT DETECTED NOT DETECTED Final   Coronavirus OC43 NOT DETECTED NOT DETECTED Final   Metapneumovirus NOT DETECTED NOT DETECTED Final   Rhinovirus / Enterovirus NOT DETECTED NOT DETECTED Final   Influenza A NOT DETECTED NOT DETECTED Final   Influenza B NOT DETECTED NOT DETECTED Final   Parainfluenza Virus 1 NOT DETECTED NOT DETECTED Final   Parainfluenza Virus 2 NOT DETECTED NOT DETECTED Final   Parainfluenza Virus 3 NOT DETECTED NOT DETECTED Final   Parainfluenza Virus 4 NOT DETECTED NOT DETECTED Final   Respiratory Syncytial Virus NOT DETECTED NOT DETECTED Final   Bordetella pertussis NOT DETECTED NOT DETECTED Final   Bordetella Parapertussis NOT DETECTED NOT DETECTED Final   Chlamydophila pneumoniae NOT DETECTED NOT DETECTED Final   Mycoplasma pneumoniae NOT DETECTED NOT DETECTED Final    Comment: Performed at [facility], [address], [place], [place] [address]     Scheduled Meds:  ascorbic acid  500 mg Oral Daily   aspirin EC  81 mg Oral Q breakfast   budesonide (PULMICORT) nebulizer solution  0.5 mg Nebulization BID   cholecalciferol  1,000 Units Oral Daily   DermaCerin   Topical BID   enoxaparin (LOVENOX) injection  40 mg Subcutaneous Q24H   ferrous sulfate  325 mg Oral Q breakfast   gabapentin  300 mg Oral TID   ipratropium-albuterol  3 mL Nebulization BID   metoprolol tartrate  25 mg Oral BID   mirabegron ER  25 mg Oral Daily   multivitamin with minerals  1 tablet Oral Daily   pravastatin  20 mg Oral Daily   venlafaxine  37.5 mg Oral Daily   Continuous Infusions:  cefTRIAXone (ROCEPHIN)  IV 2 g (07/22/23 1442)    Procedures/Studies: ECHOCARDIOGRAM COMPLETE Result Date: 07/23/2023    ECHOCARDIOGRAM REPORT   Patient Name:   DANYLAH HOLDEN Date of Exam: 07/22/2023 Medical Rec #:  [id number]             Height:       66.0 in Accession #:    [id number]            Weight:       257.1 lb Date of Birth:  March 05, 1941             BSA:          2.225 m Patient Age:     83 years              BP:           120/61  mmHg Patient Gender: F                     HR:           107 bpm. [facility] Procedure: 2D Echo, Cardiac Doppler and Color Doppler (Both Spectral and Color            Flow Doppler were utilized during procedure). Indications:    Dyspnea  History:        Patient has prior history of Echocardiogram examinations, most                 recent 12/04/2022. CHF, Pacemaker, TIA, Arrythmias:Bradycardia and                 RBBB, Signs/Symptoms:Dyspnea; Risk Factors:Hypertension, Sleep                 Apnea, Dyslipidemia and Former Smoker. There is a 26 mm Sapien                 stented prosthetic valve in the Aortic position. Procedure date                 March 2020.                 Aortic Valve: 26 mm Sapien prosthetic, stented (TAVR) valve is                 present in the aortic position.  Sonographer:    Mikki Harbor Referring Phys: [phone] Aric Jost  Sonographer Comments: Image acquisition challenging due to respiratory motion. IMPRESSIONS  1. Left ventricular ejection fraction, by estimation, is 55 to 60%. The left ventricle has normal function. Left ventricular endocardial border not optimally defined to evaluate regional wall motion. There is moderate left ventricular hypertrophy. Indeterminate diastolic filling due to E-A fusion.  2. Right ventricular systolic function is normal. The right ventricular size is normal.  3. Left atrial size was severely dilated.  4. The mitral valve is normal in structure. Mild mitral valve regurgitation. Mild to moderate mitral stenosis in the setting of tachycardia. The mean mitral valve gradient is 7.0 mmHg. Severe mitral annular calcification.  5. The aortic valve has been repaired/replaced. Aortic valve regurgitation is trivial. There is a 26 mm Sapien prosthetic (TAVR) valve present in the aortic position. Aortic valve mean gradient measures 7.7 mmHg.  6. The inferior vena cava is dilated in size with >50% respiratory  variability, suggesting right atrial pressure of 8 mmHg.  7. Increased flow velocities may be secondary to anemia, thyrotoxicosis, hyperdynamic or high flow state. Comparison(s): Changes from prior study are noted. Mild to moderate MS is new but gradients are likely exaggerated in the setting of tachycardia. FINDINGS  Left Ventricle: Left ventricular ejection fraction, by estimation, is 55 to 60%. The left ventricle has normal function. Left ventricular endocardial border not optimally defined to evaluate regional wall motion. Strain was performed and the global longitudinal strain is indeterminate. The left ventricular internal cavity size was normal in size. There is moderate left ventricular hypertrophy. Indeterminate diastolic filling due to E-A fusion. Right Ventricle: The right ventricular size is normal. No increase in right ventricular wall thickness. Right ventricular systolic function is normal. Left Atrium: Left atrial size was severely dilated. Right Atrium: Right atrial size was normal in size. Pericardium: There is no evidence of pericardial effusion. Mitral Valve: The mitral valve is normal in structure. Severe mitral annular calcification. Mild mitral valve regurgitation.  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 MV peak gradient, 14.7 mmHg. The mean mitral valve gradient is 7.0 mmHg. Tricuspid Valve: The tricuspid valve is normal in structure. Tricuspid valve regurgitation is not demonstrated. No evidence of tricuspid stenosis. Aortic Valve: The aortic valve has been repaired/replaced. Aortic valve regurgitation is trivial. Aortic valve mean gradient measures 7.7 mmHg. Aortic valve peak gradient measures 16.8 mmHg. Aortic valve area, by VTI measures 2.60 cm. There is a 26 mm Sapien prosthetic, stented (TAVR) valve present in the aortic position. Pulmonic Valve: The pulmonic valve was grossly normal. Pulmonic valve regurgitation is trivial. No evidence of pulmonic stenosis. Aorta: The aortic root is  normal in size and structure. Venous: The inferior vena cava is dilated in size with greater than 50% respiratory variability, suggesting right atrial pressure of 8 mmHg. IAS/Shunts: No atrial level shunt detected by color flow Doppler. Additional Comments: 3D was performed not requiring image post processing on an independent workstation and was indeterminate. A device lead is visualized.  LEFT VENTRICLE PLAX 2D LVIDd:         4.70 cm LVIDs:         2.40 cm LV PW:         1.60 cm LV IVS:        1.60 cm LVOT diam:     2.00 cm LV SV:         87 LV SV Index:   39 LVOT Area:     3.14 cm  LV Volumes (MOD) LV vol d, MOD A2C: 47.1 ml LV vol d, MOD A4C: 47.0 ml LV vol s, MOD A2C: 27.7 ml LV vol s, MOD A4C: 20.7 ml LV SV MOD A2C:     19.4 ml LV SV MOD A4C:     47.0 ml LV SV MOD BP:      23.8 ml RIGHT VENTRICLE RV Basal diam:  3.45 cm RV Mid diam:    3.80 cm LEFT ATRIUM              Index        RIGHT ATRIUM           Index LA diam:        5.10 cm  2.29 cm/m   RA Area:     17.20 cm LA Vol (A2C):   173.0 ml 77.76 ml/m  RA Volume:   46.00 ml  20.68 ml/m LA Vol (A4C):   125.0 ml 56.18 ml/m LA Biplane Vol: 151.0 ml 67.87 ml/m  AORTIC VALVE                     PULMONIC VALVE AV Area (Vmax):    2.63 cm      PV Vmax:       0.98 m/s AV Area (Vmean):   2.64 cm      PV Peak grad:  3.8 mmHg AV Area (VTI):     2.60 cm AV Vmax:           205.00 cm/s AV Vmean:          128.667 cm/s AV VTI:            0.335 m AV Peak Grad:      16.8 mmHg AV Mean Grad:      7.7 mmHg LVOT Vmax:         171.33 cm/s LVOT Vmean:        108.000 cm/s LVOT VTI:  0.277 m LVOT/AV VTI ratio: 0.83  AORTA Ao Root diam: 4.10 cm MITRAL VALVE MV Area (PHT): 6.02 cm     SHUNTS MV Area VTI:   3.31 cm     Systemic VTI:  0.28 m MV Peak grad:  14.7 mmHg    Systemic Diam: 2.00 cm MV Mean grad:  7.0 mmHg MV Vmax:       1.92 m/s MV Vmean:      118.0 cm/s MV Decel Time: 126 msec MV E velocity: 143.00 cm/s Vishnu Priya Mallipeddi Electronically signed by Winfield Rast Mallipeddi Signature Date/Time: 07/23/2023/2:39:32 PM    Final    CT Angio Chest PE W and/or Wo Contrast Result Date: 07/21/2023 CLINICAL DATA:  Tachycardia.  Possible pulmonary embolus. EXAM: CT ANGIOGRAPHY CHEST WITH CONTRAST TECHNIQUE: Multidetector CT imaging of the chest was performed using the standard protocol during bolus administration of intravenous contrast. Multiplanar CT image reconstructions and MIPs were obtained to evaluate the vascular anatomy. RADIATION DOSE REDUCTION: This exam was performed according to the departmental dose-optimization program which includes automated exposure control, adjustment of the mA and/or kV according to patient size and/or use of iterative reconstruction technique. CONTRAST:  75mL OMNIPAQUE IOHEXOL 350 MG/ML SOLN COMPARISON:  04/05/2022. FINDINGS: Cardiovascular: Negative for pulmonary embolus. Atherosclerotic calcification of the aorta and coronary arteries. Aortic valve replacement. Enlarged pulmonic trunk and heart. No pericardial effusion. Mediastinum/Nodes: No pathologically enlarged mediastinal, hilar or axillary lymph nodes. Esophagus is grossly unremarkable. Lungs/Pleura: Image quality is somewhat degraded by expiratory phase imaging and respiratory motion. Mosaic pulmonary parenchymal attenuation, as before. Chronic atelectasis in the right lung base, adjacent to an elevated right hemidiaphragm. No pleural fluid. Airway is unremarkable. Upper Abdomen: Chronic pneumobilia. Subcentimeter lesions in the kidneys, too small to characterize. No specific follow-up necessary. Probable splenic cyst. Visualized portions of the liver, gallbladder, adrenal glands, kidneys, spleen, pancreas, stomach and bowel are otherwise grossly unremarkable. No upper abdominal adenopathy. Musculoskeletal: Degenerative changes in the spine. Review of the MIP images confirms the above findings. IMPRESSION: 1. Negative for pulmonary embolus. 2. Mosaic pulmonary parenchymal  attenuation is nonspecific but can be seen in the setting of small airways disease. 3. Aortic atherosclerosis (ICD10-I70.0). Coronary artery calcification. 4. Enlarged pulmonic trunk, indicative of pulmonary arterial hypertension. Electronically Signed   By: Leanna Battles M.D.   On: 07/21/2023 13:47   DG Chest Port 1 View Result Date: 07/21/2023 CLINICAL DATA:  Sepsis EXAM: PORTABLE CHEST 1 VIEW COMPARISON:  Chest radiograph dated 05/09/2022 FINDINGS: Lines/tubes: Left chest wall pacemaker leads project over the right atrium and ventricle. Lungs: Low lung volumes with bronchovascular crowding. Unchanged right lower lung linear opacity and increased dense left retrocardiac opacity. Pleura: No pneumothorax or definite pleural effusion. Heart/mediastinum: Similar markedly enlarged cardiomediastinal silhouette. Bones: No acute osseous abnormality. IMPRESSION: 1. Low lung volumes with bronchovascular crowding. Unchanged right lower lung linear opacity and increased dense left retrocardiac opacity, which may represent atelectasis, aspiration, or pneumonia. 2. Similar markedly enlarged cardiomediastinal silhouette. Electronically Signed   By: Agustin Cree M.D.   On: 07/21/2023 10:54   CUP PACEART REMOTE DEVICE CHECK Result Date: 07/10/2023 Scheduled remote reviewed. Normal device function.  AT detections noted in the last week, EGMs reviewed on website consistent with AFL.  Overall AF burden <1%, but was increased up to almost 100% last week, corresponding to increase in v. rate from 80 bpm to > 100 bpm.  On ASA, no OAC and no h/o AF documented per Epic. Routing to triage for further review  of increase in atrial arrhythmia burden from previous with no OAC per protocol. Next remote 91 days. - CS, CVRS   Catarina Hartshorn, DO  [facility]  If 7PM-7AM, please contact night-coverage [email or web address] Password [email or web address] 07/23/2023, 2:55 PM   LOS: 1 day

## 2023-07-23 NOTE — Plan of Care (Signed)
  Problem: Acute Rehab PT Goals(only PT should resolve) Goal: Pt Will Go Supine/Side To Sit Outcome: Progressing Flowsheets (Taken 07/23/2023 1545) Pt will go Supine/Side to Sit:  with modified independence  with supervision Goal: Patient Will Transfer Sit To/From Stand Outcome: Progressing Flowsheets (Taken 07/23/2023 1545) Patient will transfer sit to/from stand:  with modified independence  with supervision Goal: Pt Will Transfer Bed To Chair/Chair To Bed Outcome: Progressing Flowsheets (Taken 07/23/2023 1545) Pt will Transfer Bed to Chair/Chair to Bed:  with modified independence  with supervision Goal: Pt Will Ambulate Outcome: Progressing Flowsheets (Taken 07/23/2023 1545) Pt will Ambulate:  15 feet  with supervision  with contact guard assist  with rolling walker   3:45 PM, 07/23/23 Ocie Bob, MPT Physical Therapist with [facility] [address] [phone] office [phone] mobile phone

## 2023-07-23 NOTE — Progress Notes (Signed)
 Nurse at bedside this am,patient alert and oriented times four.Heart rate 109,Blood pressure 115/66,Dr Tat notified.Plan of care on going.

## 2023-07-23 NOTE — Care Management Important Message (Signed)
 Important Message  Patient Details  Name: Dawn Gonzales MRN: [id number] Date of Birth: [birth year]   Important Message Given:  N/A - LOS <3 / Initial given by admissions     Corey Harold 07/23/2023, 11:32 AM

## 2023-07-24 DIAGNOSIS — I4719 Other supraventricular tachycardia: Secondary | ICD-10-CM

## 2023-07-24 DIAGNOSIS — R531 Weakness: Secondary | ICD-10-CM | POA: Diagnosis not present

## 2023-07-24 DIAGNOSIS — I5032 Chronic diastolic (congestive) heart failure: Secondary | ICD-10-CM | POA: Diagnosis not present

## 2023-07-24 DIAGNOSIS — L03115 Cellulitis of right lower limb: Principal | ICD-10-CM

## 2023-07-24 DIAGNOSIS — J9611 Chronic respiratory failure with hypoxia: Secondary | ICD-10-CM | POA: Diagnosis not present

## 2023-07-24 DIAGNOSIS — I342 Nonrheumatic mitral (valve) stenosis: Secondary | ICD-10-CM

## 2023-07-24 DIAGNOSIS — L03116 Cellulitis of left lower limb: Secondary | ICD-10-CM

## 2023-07-24 DIAGNOSIS — Z952 Presence of prosthetic heart valve: Secondary | ICD-10-CM | POA: Diagnosis not present

## 2023-07-24 LAB — LEGIONELLA PNEUMOPHILA SEROGP 1 UR AG: L. pneumophila Serogp 1 Ur Ag: NEGATIVE

## 2023-07-24 MED ORDER — METOPROLOL TARTRATE 25 MG PO TABS: 37.5000 mg | ORAL_TABLET | Freq: Two times a day (BID) | ORAL | Status: DC

## 2023-07-24 MED ORDER — TORSEMIDE 20 MG PO TABS
20.0000 mg | ORAL_TABLET | Freq: Every day | ORAL | Status: DC
  Administered 2023-07-24: 20 mg via ORAL
  Filled 2023-07-24: qty 1

## 2023-07-24 MED ORDER — METOPROLOL TARTRATE 37.5 MG PO TABS: 37.5000 mg | ORAL_TABLET | Freq: Two times a day (BID) | ORAL | 1 refills | Status: AC

## 2023-07-24 NOTE — Progress Notes (Signed)
 Progress Note  Patient Name: Dawn Gonzales Date of Encounter: 07/24/2023  Primary Cardiologist: Nona Dell, MD  Subjective   Weakness improved. No CP or SOB. Overall feeling better.  Inpatient Medications    Scheduled Meds:  ascorbic acid  500 mg Oral Daily   aspirin EC  81 mg Oral Q breakfast   budesonide (PULMICORT) nebulizer solution  0.5 mg Nebulization BID   cholecalciferol  1,000 Units Oral Daily   DermaCerin   Topical BID   enoxaparin (LOVENOX) injection  40 mg Subcutaneous Q24H   ferrous sulfate  325 mg Oral Q breakfast   gabapentin  300 mg Oral TID   ipratropium-albuterol  3 mL Nebulization BID   metoprolol tartrate  25 mg Oral BID   mirabegron ER  25 mg Oral Daily   multivitamin with minerals  1 tablet Oral Daily   pravastatin  20 mg Oral Daily   venlafaxine  37.5 mg Oral Daily   Continuous Infusions:  cefTRIAXone (ROCEPHIN)  IV 2 g (07/23/23 1621)   PRN Meds: acetaminophen **OR** acetaminophen, ipratropium-albuterol, ondansetron **OR** ondansetron (ZOFRAN) IV   Vital Signs    Vitals:   07/23/23 2115 07/24/23 0525 07/24/23 0701 07/24/23 0818  BP: 120/65 118/77  112/64  Pulse: (!) 106 (!) 105  (!) 109  Resp:      Temp: 98.8 F (37.1 C) 98.2 F (36.8 C)  98.2 F (36.8 C)  TempSrc: Oral Oral  Oral  SpO2: 95%  93% 95%  Weight:      Height:        Intake/Output Summary (Last 24 hours) at 07/24/2023 0859 Last data filed at 07/24/2023 0518 Gross per 24 hour  Intake 480 ml  Output 1900 ml  Net -1420 ml      07/21/2023    9:30 AM 03/22/2023    2:51 PM 12/20/2022    2:55 PM  Last 3 Weights  Weight (lbs) 257 lb 0.9 oz 257 lb 262 lb  Weight (kg) 116.6 kg 116.574 kg 118.842 kg     Telemetry    Suspected ongoing atrial tachycardia HR 105-110 - Personally Reviewed  Physical Exam   GEN: No acute distress. Morbidly obese HEENT: Normocephalic, atraumatic, sclera non-icteric. Neck: No JVD or bruits. Cardiac: Reg rhythm, tachycardic, no  murmurs, rubs, or gallops.  Respiratory: Clear to auscultation bilaterally. Breathing is unlabored. GI: Soft, nontender, non-distended, BS +x 4. MS: no deformity. Extremities: No clubbing or cyanosis. Trace BLE edema. Distal pedal pulses are 2+ and equal bilaterally. Neuro:  AAOx3. Follows commands. Psych:  Responds to questions appropriately with a normal affect.  Labs    High Sensitivity Troponin:  No results for input(s): "TROPONINIHS" in the last 720 hours.    Cardiac EnzymesNo results for input(s): "TROPONINI" in the last 168 hours. No results for input(s): "TROPIPOC" in the last 168 hours.   Chemistry Recent Labs  Lab 07/21/23 0940 07/22/23 0348 07/23/23 0513  NA 140 140 138  K 4.0 3.6 4.2  CL 92* 99 97*  CO2 36* 30 32  GLUCOSE 104* 87 103*  BUN 52* 40* 26*  CREATININE 0.96 0.98 0.84  CALCIUM 9.8 9.1 8.9  PROT 8.1  --   --   ALBUMIN 4.1  --   --   AST 23  --   --   ALT 19  --   --   ALKPHOS 72  --   --   BILITOT 0.7  --   --   GFRNONAA 59* 57* >60  ANIONGAP 12 11 9      Hematology Recent Labs  Lab 07/21/23 0940 07/22/23 0348 07/23/23 0513  WBC 8.6 7.1 7.0  RBC 4.50 4.13 3.81*  HGB 13.4 12.3 11.2*  HCT 42.4 39.5 36.4  MCV 94.2 95.6 95.5  MCH 29.8 29.8 29.4  MCHC 31.6 31.1 30.8  RDW 13.8 14.0 14.1  PLT 287 250 258    BNPNo results for input(s): "BNP", "PROBNP" in the last 168 hours.   DDimer No results for input(s): "DDIMER" in the last 168 hours.   Radiology    ECHOCARDIOGRAM COMPLETE Result Date: 07/23/2023    ECHOCARDIOGRAM REPORT   Patient Name:   Dawn Gonzales Date of Exam: 07/22/2023 Medical Rec #:  [id number]             Height:       66.0 in Accession #:    [id number]            Weight:       257.1 lb Date of Birth:  [birth year]             BSA:          2.225 m Patient Age:    83 years              BP:           120/61 mmHg Patient Gender: F                     HR:           107 bpm. [facility] Procedure: 2D Echo, Cardiac  Doppler and Color Doppler (Both Spectral and Color            Flow Doppler were utilized during procedure). Indications:    Dyspnea  History:        Patient has prior history of Echocardiogram examinations, most                 recent 12/04/2022. CHF, Pacemaker, TIA, Arrythmias:Bradycardia and                 RBBB, Signs/Symptoms:Dyspnea; Risk Factors:Hypertension, Sleep                 Apnea, Dyslipidemia and Former Smoker. There is a 26 mm Sapien                 stented prosthetic valve in the Aortic position. Procedure date                 March 2020.                 Aortic Valve: 26 mm Sapien prosthetic, stented (TAVR) valve is                 present in the aortic position.  Sonographer:    Mikki Harbor Referring Phys: [phone] DAVID TAT  Sonographer Comments: Image acquisition challenging due to respiratory motion. IMPRESSIONS  1. Left ventricular ejection fraction, by estimation, is 55 to 60%. The left ventricle has normal function. Left ventricular endocardial border not optimally defined to evaluate regional wall motion. There is moderate left ventricular hypertrophy. Indeterminate diastolic filling due to E-A fusion.  2. Right ventricular systolic function is normal. The right ventricular size is normal.  3. Left atrial size was severely dilated.  4. The mitral valve is normal in structure. Mild mitral valve regurgitation. Mild to moderate mitral stenosis in the setting of tachycardia. The  mean mitral valve gradient is 7.0 mmHg. Severe mitral annular calcification.  5. The aortic valve has been repaired/replaced. Aortic valve regurgitation is trivial. There is a 26 mm Sapien prosthetic (TAVR) valve present in the aortic position. Aortic valve mean gradient measures 7.7 mmHg.  6. The inferior vena cava is dilated in size with >50% respiratory variability, suggesting right atrial pressure of 8 mmHg.  7. Increased flow velocities may be secondary to anemia, thyrotoxicosis, hyperdynamic or high flow state.  Comparison(s): Changes from prior study are noted. Mild to moderate MS is new but gradients are likely exaggerated in the setting of tachycardia. FINDINGS  Left Ventricle: Left ventricular ejection fraction, by estimation, is 55 to 60%. The left ventricle has normal function. Left ventricular endocardial border not optimally defined to evaluate regional wall motion. Strain was performed and the global longitudinal strain is indeterminate. The left ventricular internal cavity size was normal in size. There is moderate left ventricular hypertrophy. Indeterminate diastolic filling due to E-A fusion. Right Ventricle: The right ventricular size is normal. No increase in right ventricular wall thickness. Right ventricular systolic function is normal. Left Atrium: Left atrial size was severely dilated. Right Atrium: Right atrial size was normal in size. Pericardium: There is no evidence of pericardial effusion. Mitral Valve: The mitral valve is normal in structure. Severe mitral annular calcification. Mild mitral valve regurgitation. 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 MV peak gradient, 14.7 mmHg. The mean mitral valve gradient is 7.0 mmHg. Tricuspid Valve: The tricuspid valve is normal in structure. Tricuspid valve regurgitation is not demonstrated. No evidence of tricuspid stenosis. Aortic Valve: The aortic valve has been repaired/replaced. Aortic valve regurgitation is trivial. Aortic valve mean gradient measures 7.7 mmHg. Aortic valve peak gradient measures 16.8 mmHg. Aortic valve area, by VTI measures 2.60 cm. There is a 26 mm Sapien prosthetic, stented (TAVR) valve present in the aortic position. Pulmonic Valve: The pulmonic valve was grossly normal. Pulmonic valve regurgitation is trivial. No evidence of pulmonic stenosis. Aorta: The aortic root is normal in size and structure. Venous: The inferior vena cava is dilated in size with greater than 50% respiratory variability, suggesting right atrial pressure of 8  mmHg. IAS/Shunts: No atrial level shunt detected by color flow Doppler. Additional Comments: 3D was performed not requiring image post processing on an independent workstation and was indeterminate. A device lead is visualized.  LEFT VENTRICLE PLAX 2D LVIDd:         4.70 cm LVIDs:         2.40 cm LV PW:         1.60 cm LV IVS:        1.60 cm LVOT diam:     2.00 cm LV SV:         87 LV SV Index:   39 LVOT Area:     3.14 cm  LV Volumes (MOD) LV vol d, MOD A2C: 47.1 ml LV vol d, MOD A4C: 47.0 ml LV vol s, MOD A2C: 27.7 ml LV vol s, MOD A4C: 20.7 ml LV SV MOD A2C:     19.4 ml LV SV MOD A4C:     47.0 ml LV SV MOD BP:      23.8 ml RIGHT VENTRICLE RV Basal diam:  3.45 cm RV Mid diam:    3.80 cm LEFT ATRIUM              Index        RIGHT ATRIUM  Index LA diam:        5.10 cm  2.29 cm/m   RA Area:     17.20 cm LA Vol (A2C):   173.0 ml 77.76 ml/m  RA Volume:   46.00 ml  20.68 ml/m LA Vol (A4C):   125.0 ml 56.18 ml/m LA Biplane Vol: 151.0 ml 67.87 ml/m  AORTIC VALVE                     PULMONIC VALVE AV Area (Vmax):    2.63 cm      PV Vmax:       0.98 m/s AV Area (Vmean):   2.64 cm      PV Peak grad:  3.8 mmHg AV Area (VTI):     2.60 cm AV Vmax:           205.00 cm/s AV Vmean:          128.667 cm/s AV VTI:            0.335 m AV Peak Grad:      16.8 mmHg AV Mean Grad:      7.7 mmHg LVOT Vmax:         171.33 cm/s LVOT Vmean:        108.000 cm/s LVOT VTI:          0.277 m LVOT/AV VTI ratio: 0.83  AORTA Ao Root diam: 4.10 cm MITRAL VALVE MV Area (PHT): 6.02 cm     SHUNTS MV Area VTI:   3.31 cm     Systemic VTI:  0.28 m MV Peak grad:  14.7 mmHg    Systemic Diam: 2.00 cm MV Mean grad:  7.0 mmHg MV Vmax:       1.92 m/s MV Vmean:      118.0 cm/s MV Decel Time: 126 msec MV E velocity: 143.00 cm/s Vishnu Priya Mallipeddi Electronically signed by Winfield Rast Mallipeddi Signature Date/Time: 07/23/2023/2:39:32 PM    Final     Cardiac Studies   2d echo 07/22/23   1. Left ventricular ejection fraction, by  estimation, is 55 to 60%. The  left ventricle has normal function. Left ventricular endocardial border  not optimally defined to evaluate regional wall motion. There is moderate  left ventricular hypertrophy.  Indeterminate diastolic filling due to E-A fusion.   2. Right ventricular systolic function is normal. The right ventricular  size is normal.   3. Left atrial size was severely dilated.   4. The mitral valve is normal in structure. Mild mitral valve  regurgitation. Mild to moderate mitral stenosis in the setting of  tachycardia. The mean mitral valve gradient is 7.0 mmHg. Severe mitral  annular calcification.   5. The aortic valve has been repaired/replaced. Aortic valve  regurgitation is trivial. There is a 26 mm Sapien prosthetic (TAVR) valve  present in the aortic position. Aortic valve mean gradient measures 7.7  mmHg.   6. The inferior vena cava is dilated in size with >50% respiratory  variability, suggesting right atrial pressure of 8 mmHg.   7. Increased flow velocities may be secondary to anemia, thyrotoxicosis,  hyperdynamic or high flow state.   Comparison(s): Changes from prior study are noted. Mild to moderate MS is  new but gradients are likely exaggerated in the setting of tachycardia.    Patient Profile     83 y.o. female with morbid obesity, hyperlipidemia, former tobacco abuse, prior mitral valve endocarditis, chronic HFpEF, TIA in 2017, cephalic vein thrombosis 2018, arthritis, primarily  wheelchair user, chronic respiratory failure on 4L home O2, OSA on CPAP, restrictive lung disease, severe aortic stenosis s/p TAVR 2020, CHB s/p Biotronik dual-chamber PPM in 2020, HTN HLD, minimal CAD by pre-TAVR cath 2019, cellulitis admitted with weakness and worsening tenderness of chronic venous ulcers. Found to be tachycardic in the 110 range.   Assessment & Plan     1. Tachycardia felt due to atrial tachycardia - device interrogation 07/09/23 showed "AT detections noted  in the last week, EGMs reviewed on website consistent with AFL.  Overall AF burden <1%, but was increased up to almost 100% last week, corresponding to increase in v. rate from 80 bpm to > 100 bpm." Dr. Ladona Ridgel had reviewed interrogation and felt stable without recommended changes. I reached out to Dr. Ladona Ridgel to clarify whether this was felt atrial tach versus atrial flutter. He reported that If the atrial rate is less than 220 we treat like AT and anti-coag is not indicated, rarely will flutter be slower and you would see typical flutter waves. Therefore anticoagulation not pursued given that this was felt to be atrial tachycardia. Metoprolol added this admission with minimal improvement in HR, will discuss further recs with MD.  - 2D echo showing EF 55-60%, mild-moderate mitral stenosis, mild mitral regurgitation, trivial AI, dilated IVC, increased flow state - mild to moderate MS is new but gradients likely exaggerated in the setting of tachycardia - TSH OK  2. Chronic HFpEF - torsemide 40mg  BID remains on hold at this time in setting of soft BP, await resumption plan by MD - monitor for developing recurrent fluid retention with ongoing tachycardia - not previously felt to be candidate for SGLT2i given concern for infection risk  3. History of CHB s/p Biotronik dual-chamber PPM in 2020 - last EP OV was in 2020, overdue, will need to be arranged as outpatient  4. LE cellulitis - per primary   5. H/o TAVR 2020, mild-moderate MS with mild MR - echo as above - follow MV disease as outpatient  For questions or updates, please contact [facility] Please consult [email or web address] for contact info under Cardiology/STEMI.  Signed, Laurann Montana, PA-C 07/24/2023, 8:59 AM

## 2023-07-24 NOTE — Progress Notes (Signed)
Patient discharged home with instructions given on medications and follow up visits verbalized understanding. Prescriptions sent to Pharmacy of choice documented on AVS. IV discontinued,catheter intact. Accompanied by staff to an awaiting vehicle.

## 2023-07-24 NOTE — Progress Notes (Signed)
 Nurse at bedside this am.Patient alert and oriented times four.Heart rate 109,blood pressure 112/64,Dr Tat notified.Plan of care on going.

## 2023-07-24 NOTE — Plan of Care (Signed)
  Problem: Education: Goal: Knowledge of General Education information will improve Description: Including pain rating scale, medication(s)/side effects and non-pharmacologic comfort measures Outcome: Progressing   Problem: Activity: Goal: Risk for activity intolerance will decrease Outcome: Progressing   Problem: Education: Goal: Knowledge of General Education information will improve Description: Including pain rating scale, medication(s)/side effects and non-pharmacologic comfort measures Outcome: Progressing   Problem: Activity: Goal: Risk for activity intolerance will decrease Outcome: Progressing

## 2023-07-24 NOTE — Plan of Care (Signed)

## 2023-07-24 NOTE — Progress Notes (Signed)
 Physical Therapy Treatment Patient Details Name: Dawn Gonzales MRN: [id number] DOB: August 12, 1940 Today's Date: 07/24/2023   History of Present Illness Dawn Gonzales is a 83 year old female with a history of hypertension, hyperlipidemia, aortic stenosis status post TAVR 06/2018,HFpEF, chronic respiratory failure on 4 L nasal cannula, OSA on CPAP, restrictive lung disease, complete heart block status post PPM, CVA presenting with generalized weakness and tachycardia.  At baseline, the patient lives by herself.  She is able to perform ADLs with no assistance.  She ambulates with a walker.  The patient went to see her PCP on 07/04/2023 secondary to tachycardia and some generalized fatigue.  Patient was initially placed on doxycycline for concerns of skin tear on her buttocks being possibly infected.  On 07/09/2023, her PCP office called back and placed the patient on Bactrim.  Patient finished a 10-day course of doxycycline 7-day course of Bactrim DS.  Her tachycardia improved back to baseline with heart rate in the 70s.  However, 3 days later the patient once again began developing some tachycardia with heart rate 110-120s.  Patient states that she has been feeling "sluggish" for the past 3 days.  She denies any headache, neck pain, fevers, chills, chest pain, nausea, vomiting or diarrhea, abdominal pain.  She denies any dysuria or hematuria.  She has had some dyspnea on exertion and nonproductive cough.  Besides the antibiotics described above, there is been no new medications.  Because of her generalized weakness and tachycardia, the patient was brought to emergency for further evaluation and treatment.  In the ED, patient is afebrile and hemodynamically stable with oxygen saturation 93% on 3 L.  WBC 8.6, hemoglobin 13.4, platelets 287.  3140, potassium 4.0, bicarbonate 36, serum creatinine 0.96.  LFTs were unremarkable.  Lactic acid 1.0>> 1.1.  CTA chest was negative for PE.  There was mosaic  parenchymal attenuation.  There is enlarged pulmonary trunk consistent with pulmonary hypertension.  There is chronic right lower lobe atelectasis.  The patient was given started ceftriaxone and azithromycin.    PT Comments  Pt demonstrates ability to ambulate slightly increased distance today in room with RW and CGA. Pt continues to demonstrate decreased stride length and shuffling gait with SOB evident. Pt remained on 3 Lpm during ambulation and took about 3 minutes to catch breath after ambulation, pt confirms she is on 2-3 Lpm supplemental O2 unit at home. Pt would continue to benefit from skilled acute PT for increased LE strength, activity tolerance, and further education for pacing strategies to increase independence with home/community ambulation and return to higher level of function.   If plan is discharge home, recommend the following: A little help with walking and/or transfers;A little help with bathing/dressing/bathroom;Help with stairs or ramp for entrance;Assistance with cooking/housework   Can travel by private vehicle        Equipment Recommendations  None recommended by PT    Recommendations for Other Services       Precautions / Restrictions Precautions Precautions: Fall Recall of Precautions/Restrictions: Intact Restrictions Weight Bearing Restrictions Per Provider Order: No     Mobility  Bed Mobility Overal bed mobility: Needs Assistance Bed Mobility: Supine to Sit     Supine to sit: Supervision, HOB elevated     General bed mobility comments: had to use bed rails for pulling self to sitting    Transfers Overall transfer level: Needs assistance Equipment used: Rolling walker (2 wheels) Transfers: Sit to/from Stand, Bed to chair/wheelchair/BSC Sit to Stand: Contact guard assist  Step pivot transfers: Contact guard assist       General transfer comment: required increased time for completing sit to stands    Ambulation/Gait Ambulation/Gait  assistance: Contact guard assist Gait Distance (Feet): 14 Feet Assistive device: Rolling walker (2 wheels) Gait Pattern/deviations: Decreased step length - left, Decreased stance time - right, Decreased stride length Gait velocity: slow     General Gait Details: limited to a few slow labored steps at bedside having to lean over RW for support not using handles, but leaned on front bar, no loss of balance, limited mostly due to fatigue   Stairs             Wheelchair Mobility     Tilt Bed    Modified Rankin (Stroke Patients Only)       Balance Overall balance assessment: Needs assistance Sitting-balance support: Feet supported, No upper extremity supported Sitting balance-Leahy Scale: Good Sitting balance - Comments: seated at EOB   Standing balance support: Reliant on assistive device for balance, During functional activity, Bilateral upper extremity supported Standing balance-Leahy Scale: Fair Standing balance comment: using RW                            Communication Communication Communication: No apparent difficulties  Cognition Arousal: Alert Behavior During Therapy: WFL for tasks assessed/performed   PT - Cognitive impairments: No apparent impairments                         Following commands: Intact      Cueing Cueing Techniques: Verbal cues  Exercises      General Comments        Pertinent Vitals/Pain Pain Assessment Pain Assessment: No/denies pain    Home Living Family/patient expects to be discharged to:: Private residence Living Arrangements: Alone Available Help at Discharge: Family;Friend(s);Personal care attendant;Available PRN/intermittently Type of Home: House Home Access: Ramped entrance       Home Layout: One level Home Equipment: Agricultural consultant (2 wheels);Wheelchair - manual;Grab bars - tub/shower;Grab bars - toilet;Shower seat - built in;BSC/3in1      Prior Function            PT Goals (current  goals can now be found in the care plan section) Acute Rehab PT Goals Patient Stated Goal: return home with family, home aides to assist PT Goal Formulation: With patient Time For Goal Achievement: 07/27/23 Potential to Achieve Goals: Good Progress towards PT goals: Progressing toward goals    Frequency    Min 3X/week      PT Plan      Co-evaluation              AM-PAC PT "6 Clicks" Mobility   Outcome Measure  Help needed turning from your back to your side while in a flat bed without using bedrails?: A Little Help needed moving from lying on your back to sitting on the side of a flat bed without using bedrails?: A Little Help needed moving to and from a bed to a chair (including a wheelchair)?: A Little Help needed standing up from a chair using your arms (e.g., wheelchair or bedside chair)?: A Little Help needed to walk in hospital room?: A Little Help needed climbing 3-5 steps with a railing? : A Lot 6 Click Score: 17    End of Session Equipment Utilized During Treatment: Oxygen Activity Tolerance: Patient tolerated treatment well;Patient limited by fatigue Patient left: in  chair;with call bell/phone within reach Nurse Communication: Mobility status PT Visit Diagnosis: Unsteadiness on feet (R26.81);Other abnormalities of gait and mobility (R26.89);Muscle weakness (generalized) (M62.81)     Time: 2956-2130 PT Time Calculation (min) (ACUTE ONLY): 28 min  Charges:    $Therapeutic Activity: 23-37 mins PT General Charges $$ ACUTE PT VISIT: 1 Visit                     Luz Lex, PT, DPT [facility] Office: [phone] 12:43 PM, 07/24/23

## 2023-07-24 NOTE — TOC Transition Note (Addendum)
 Transition of Care [facility]) - Discharge Note   Patient Details  Name: Dawn Gonzales MRN: [id number] Date of Birth: 06-Oct-1940  Transition of Care [facility]) CM/SW Contact:  Karn Cassis, LCSW Phone Number: 07/24/2023, 1:59 PM   Clinical Narrative: Pt d/c today. HHPT recommended. Pt agreeable to [facility] or [facility]. Accepted by K[facility]ee Keen with [facility]. HHPT order in.     Per MD, pt said yesterday that her concentrator was broken. LCSW discussed with pt and she states it is her cpap and her daughter has already taken it to [facility] to have them look at it.    Final next level of care: Home w Home Health Services Barriers to Discharge: Barriers Resolved   Patient Goals and CMS Choice Patient states their goals for this hospitalization and ongoing recovery are:: return home   Choice offered to / list presented to : Patient [place] ownership interest in [facility].provided to::  (n/a)    Discharge Placement                    Patient and family notified of of transfer: 07/24/23  Discharge Plan and Services Additional resources added to the After Visit Summary for                            [facility] Arranged: PT [facility] Agency: [facility] Date [facility] Agency Contacted: 07/24/23 Time HH Agency Contacted: [id number] Representative spoke with at [facility] Agency: Kandee Keen  Social Drivers of Health (SDOH) Interventions SDOH Screenings   Food Insecurity: No Food Insecurity (07/21/2023)  Housing: Low Risk  (07/21/2023)  Transportation Needs: No Transportation Needs (07/21/2023)  Utilities: Not At Risk (07/21/2023)  Social Connections: Socially Isolated (07/21/2023)  Tobacco Use: Medium Risk (07/21/2023)     Readmission Risk Interventions    05/11/2022    2:42 PM  Readmission Risk Prevention Plan  Post Dischage Appt Not Complete  Medication Screening Complete  Transportation Screening Complete

## 2023-07-25 ENCOUNTER — Telehealth: Payer: Self-pay

## 2023-07-25 DIAGNOSIS — I442 Atrioventricular block, complete: Secondary | ICD-10-CM | POA: Diagnosis not present

## 2023-07-25 DIAGNOSIS — I2510 Atherosclerotic heart disease of native coronary artery without angina pectoris: Secondary | ICD-10-CM | POA: Diagnosis not present

## 2023-07-25 DIAGNOSIS — I69398 Other sequelae of cerebral infarction: Secondary | ICD-10-CM | POA: Diagnosis not present

## 2023-07-25 DIAGNOSIS — J9611 Chronic respiratory failure with hypoxia: Secondary | ICD-10-CM | POA: Diagnosis not present

## 2023-07-25 DIAGNOSIS — I5032 Chronic diastolic (congestive) heart failure: Secondary | ICD-10-CM | POA: Diagnosis not present

## 2023-07-25 DIAGNOSIS — J9612 Chronic respiratory failure with hypercapnia: Secondary | ICD-10-CM | POA: Diagnosis not present

## 2023-07-25 DIAGNOSIS — M6281 Muscle weakness (generalized): Secondary | ICD-10-CM | POA: Insufficient documentation

## 2023-07-25 DIAGNOSIS — L89312 Pressure ulcer of right buttock, stage 2: Secondary | ICD-10-CM | POA: Diagnosis not present

## 2023-07-25 DIAGNOSIS — I110 Hypertensive heart disease with heart failure: Secondary | ICD-10-CM | POA: Diagnosis not present

## 2023-07-25 NOTE — Transitions of Care (Post Inpatient/ED Visit) (Signed)
 07/25/2023  Name: Dawn Gonzales MRN: [id number] DOB: 04/26/1941  Today's TOC FU Call Status: Today's TOC FU Call Status:: Successful TOC FU Call Completed TOC FU Call Complete Date: 07/25/23 Patient's Name and Date of Birth confirmed.  Transition Care Management Follow-up Telephone Call Date of Discharge: 07/24/23 [facility] Type of Discharge: Inpatient Admission Primary Inpatient Discharge Diagnosis:: Generalized weakness  -Multifactorial including deconditioning, dehydration, and possibly infection How have you been since you were released from the hospital?: Better Any questions or concerns?: No  Items Reviewed: Did you receive and understand the discharge instructions provided?: Yes Medications obtained,verified, and reconciled?: Yes (Medications Reviewed) (Medication reconciliation completed based on recent discharge summary Patient taking medications as instructed and is aware of any changes or dosage adjustments medication regimen. Patient denies questions and reports no barriers to medication adherence) Any new allergies since your discharge?: No Dietary orders reviewed?: Yes Type of Diet Ordered:: Reg Heart Healthy Do you have support at home?: Yes People in Home: spouse Name of Support/Comfort Primary Source: Daughter -Maralyn Sago  Medications Reviewed Today: Medications Reviewed Today     Reviewed by Johnnette Barrios, RN (Registered Nurse) on 07/25/23 at 1425  Med List Status: <None>   Medication Order Taking? Sig Documenting Provider Last Dose Status Informant  acetaminophen (TYLENOL) 325 MG tablet [id number] Yes Take 2 tablets (650 mg total) by mouth every 6 (six) hours as needed for mild pain, fever or headache (or Fever >/= 101). Shon Hale, MD Taking Active Child  aspirin EC 81 MG tablet [id number] Yes Take 1 tablet (81 mg total) by mouth daily with breakfast. Shon Hale, MD Taking Active Child  Calcium Carbonate (CALCIUM-CARB  600 PO) [id number] Yes Take 600 mg by mouth daily. [provider] Taking Active Child  cholecalciferol (VITAMIN D) 1000 units tablet [id number] Yes Take 1,000 Units by mouth daily. [provider] Taking Active Child  dextromethorphan-guaiFENesin (MUCINEX DM) 30-600 MG 12hr tablet [id number] Yes Take 1 tablet by mouth 2 (two) times daily.  Patient taking differently: Take 1 tablet by mouth 2 (two) times daily as needed for cough.   Shon Hale, MD Taking Active Child  ferrous sulfate 325 (65 FE) MG tablet [id number] Yes Take 325 mg by mouth daily with breakfast. [provider] Taking Active Child  gabapentin (NEURONTIN) 300 MG capsule [id number] Yes Take 300 mg by mouth 2 (two) times daily. [provider] Taking Active Child           Med Note Sharlene Dory   Wed Apr 05, 2022 12:16 PM)    ipratropium-albuterol (DUONEB) 0.5-2.5 (3) MG/3ML SOLN [id number] Yes Take 3 mLs by nebulization 2 (two) times daily. Kendell Bane, MD Taking Active Child  metoprolol tartrate 37.5 MG TABS [id number] Yes Take 1 tablet (37.5 mg total) by mouth 2 (two) times daily. Catarina Hartshorn, MD Taking Active   Multiple Vitamin (MULTIVITAMIN WITH MINERALS) TABS tablet [id number] Yes Take 1 tablet by mouth daily. [provider] Taking Active Child  nystatin (MYCOSTATIN/NYSTOP) powder [id number] Yes APPLY TO AFFECTED AREA TWICE A DAY [provider] Taking Active Child           Med Note Elesa Massed, ANGELICA G   Sat Jul 21, 2023  7:26 PM)    OXYGEN [id number] Yes Inhale 3 L into the lungs. With long tube mostly 1-1.5 [provider] Taking Active Child  potassium chloride (KLOR-CON) 10 MEQ tablet [id number] Yes Take 1 tablet (10 mEq total) by mouth daily.  Take While taking Demadex/Torsemide Shon Hale, MD Taking Active Child  pravastatin (PRAVACHOL) 20 MG tablet [id number] Yes Take 20 mg by mouth daily. [provider] Taking Active Child  silver  sulfADIAZINE (SILVADENE) 1 % cream [id number]  Apply 1 Application topically daily as needed (wound). [provider]  Active Child  torsemide (DEMADEX) 20 MG tablet [id number] Yes Take 2 tablets (40 mg total) by mouth 2 (two) times daily. Jonelle Sidle, MD Taking Active Child  Turmeric (QC TUMERIC COMPLEX) 500 MG CAPS [id number] Yes Take 1 capsule by mouth at bedtime. [provider] Taking Active Child  venlafaxine (EFFEXOR) 37.5 MG tablet [id number] Yes Take 37.5 mg by mouth daily. [provider] Taking Active Child           Med Note Sharlene Dory   Wed Apr 05, 2022 12:16 PM)    Vibegron (GEMTESA) 75 MG TABS [id number] Yes Take 1 tablet (75 mg total) by mouth daily. McKenzie, Mardene Celeste, MD Taking Active Child  vitamin C (ASCORBIC ACID) 500 MG tablet [id number] Yes Take 500 mg by mouth daily. [provider] Taking Active Child          Medication reconciliation / review completed based on most recent discharge summary and EHR medication list. Confirmed patient is taking all newly prescribed medications as instructed (any discrepancies are noted in review section)   Patient / Caregiver is aware of any changes to and / or  any dosage adjustments to medication regimen. Patient/ Caregiver denies questions at this time and reports no barriers to medication adherence.   Medication changes and updates reviewed with the patient  START taking: Metoprolol Tartrate 37.5 MG Tabs Take 1 tablet (37.5 mg total) by mouth 2 (two) times daily. Changes  dextromethorphan-guaiFENesin 30-600 MG 12hr tablet Commonly known as: MUCINEX DM Take 1 tablet by mouth 2 (two) times daily. What changed:  when to take this reasons to take this   STOP taking: doxycycline 100 MG tablet (VIBRA-TABS) sulfamethoxazole-trimethoprim 800-160 MG tablet (BACTRIM DS)   TAKE these medications     acetaminophen 325 MG tablet Commonly known as: TYLENOL Take 2 tablets (650 mg total) by  mouth every 6 (six) hours as needed for mild pain, fever or headache (or Fever >/= 101).    ascorbic acid 500 MG tablet Commonly known as: VITAMIN C Take 500 mg by mouth daily.    aspirin EC 81 MG tablet Take 1 tablet (81 mg total) by mouth daily with breakfast.    CALCIUM-CARB 600 PO Take 600 mg by mouth daily.    cholecalciferol 1000 units tablet Commonly known as: VITAMIN D Take 1,000 Units by mouth daily.    dextromethorphan-guaiFENesin 30-600 MG 12hr tablet Commonly known as: MUCINEX DM Take 1 tablet by mouth 2 (two) times daily. What changed:  when to take this reasons to take this    ferrous sulfate 325 (65 FE) MG tablet Take 325 mg by mouth daily with breakfast.    gabapentin 300 MG capsule Commonly known as: NEURONTIN Take 300 mg by mouth 2 (two) times daily.    Gemtesa 75 MG Tabs Generic drug: Vibegron Take 1 tablet (75 mg total) by mouth daily.    ipratropium-albuterol 0.5-2.5 (3) MG/3ML Soln Commonly known as: DUONEB Take 3 mLs by nebulization 2 (two) times daily.    Metoprolol Tartrate 37.5 MG Tabs Take 1 tablet (37.5 mg total) by mouth 2 (two) times daily.    multivitamin with minerals Tabs tablet Take 1 tablet  by mouth daily.    nystatin powder Commonly known as: MYCOSTATIN/NYSTOP APPLY TO AFFECTED AREA TWICE A DAY    OXYGEN Inhale 3 L into the lungs. With long tube mostly 1-1.5    potassium chloride 10 MEQ tablet Commonly known as: KLOR-CON Take 1 tablet (10 mEq total) by mouth daily. Take While taking Demadex/Torsemide    pravastatin 20 MG tablet Commonly known as: PRAVACHOL Take 20 mg by mouth daily.    QC Tumeric Complex 500 MG Caps Generic drug: Turmeric Take 1 capsule by mouth at bedtime.    silver sulfADIAZINE 1 % cream Commonly known as: SILVADENE Apply 1 Application topically daily as needed (wound).    torsemide 20 MG tablet Commonly known as: DEMADEX Take 2 tablets (40 mg total) by mouth 2 (two) times daily.     venlafaxine 37.5 MG tablet Commonly known as: EFFEXOR Take 37.5 mg by mouth daily.        Home Care and Equipment/Supplies: Were Home Health Services Ordered?: Yes Name of Home Health Agency:: [facility] [phone] Has Agency set up a time to come to your home?: Yes First Home Health Visit Date: 07/25/23 Any new equipment or medical supplies ordered?: Yes Name of Medical supply agency?: [facility] Were you able to get the equipment/medical supplies?: Yes Do you have any questions related to the use of the equipment/supplies?: Yes What questions do you have?: Trouble geting supplues from [facility] for BiPAP  Functional Questionnaire: Do you need assistance with bathing/showering or dressing?: Yes Do you need assistance with meal preparation?: No Do you need assistance with eating?: No Do you have difficulty maintaining continence: Yes Do you need assistance with getting out of bed/getting out of a chair/moving?: Yes (sleeps in recliner) Do you have difficulty managing or taking your medications?: Yes (Daughter sets up)  Follow up appointments reviewed: PCP Follow-up appointment confirmed?: Yes Date of PCP follow-up appointment?: 07/30/23 Follow-up Provider: Nita Sells Specialist [facility] Follow-up appointment confirmed?: Yes Date of Specialist follow-up appointment?: 08/15/23 Follow-Up Specialty Provider:: Cardiology Do you need transportation to your follow-up appointment?: No Do you understand care options if your condition(s) worsen?: Yes-patient verbalized understanding  SDOH Interventions Today    Flowsheet Row Most Recent Value  SDOH Interventions   Food Insecurity Interventions Intervention Not Indicated  Housing Interventions Intervention Not Indicated  Transportation Interventions Intervention Not Indicated, Patient Resources (Friends/Family), Payor Benefit  Utilities Interventions Intervention Not Indicated       Goals Addressed             This Visit's Progress     TOC Care Plan       Current Barriers:  Medication management  Provider appointments  Home Health services- [facility] requested SN  Equipment/DME trouble with BiPAP supplies  Functional/Safety -sleeps in recliner   RNCM Clinical Goal(s):  Patient will work with the Care Management team over the next 30 days to address Transition of Care Barriers: Medication Management Support at home Provider appointments Home Health services Equipment/DME take all medications exactly as prescribed and will call provider for medication related questions as evidenced by no missed medication doses  attend all scheduled medical appointments:  as evidenced by no missed follow-up visits  demonstrate ongoing self health care management ability  as evidenced by decreased ER and hospital admissions through collaboration with RN Care manager, provider, and care team.   Interventions: Evaluation of current treatment plan related to  self management and patient's adherence to plan as established by provider  Transitions of Care:  New goal. Sick Day Rules Reviewed Doctor Visits  - discussed the importance of doctor visits Contacted Health RN/OT/PT - [facility] [phone] Troubleshoot use of DME in the home  Post discharge activity limitations prescribed by provider reviewed Post-op wound/incision care reviewed with patient/caregiver Reviewed Signs and symptoms of infection  Heart Failure Interventions:  (Status:  New goal.) Short Term Goal Provided education on low sodium diet Assessed need for readable accurate scales in home Provided education about placing scale on hard, flat surface Advised patient to weigh each morning after emptying bladder Reviewed role of diuretics in prevention of fluid overload and management of heart failure; Discussed the importance of keeping all appointments with provider Advised patient to discuss medication changes and DM supplies  with provider Assessed social determinant  of health barriers   Patient Goals/Self-Care Activities: Participate in Transition of Care Program/Attend TOC scheduled calls Take all medications as prescribed Attend all scheduled provider appointments Call pharmacy for medication refills 3-7 days in advance of running out of medications Perform all self care activities independently  Perform IADL's (shopping, preparing meals, housekeeping, managing finances) independently Call provider office for new concerns or questions  call office if I gain more than 2 pounds in one day or 5 pounds in one week keep legs up while sitting use salt in moderation watch for swelling in feet, ankles and legs every day weigh myself daily bring diary to all appointments eat more whole grains, fruits and vegetables, lean meats and healthy fats know when to call the doctor  Follow Up Plan:  Telephone follow up appointment with care management team member scheduled for:  08/01/23 @ 2:30pm  The patient has been provided with contact information for the care management team and has been advised to call with any health related questions or concerns.             Plan Follow-up with [facility] , consider adding Skilled Nursing Follow-up with [facility] for additional supplies for BiPAP/ request new script for supplies. Try to weigh daily notify PCP of weight gain > 2 lbs ON. Check Heart rate notify Cardiology and PCP id > 100 more that 3 days in row  Continue )2 3-4 L/M  Consider APP mattress for hospital bed  Wound care to sacral wound ( possible Stage 2 ) Use cushion in chair and change positions frequently  Patient is at high risk for readmission and/or has history of  high utilization  Discussed VBCI  TOC program and weekly calls to patient to assess condition/status, medication management  and provide support/education as indicated . Patient/ Caregiver voiced understanding and is  agreeable to 30 day program  Routine follow-up and on-going assessment evaluation  and education of disease processes, recommended interventions for both chronic and acute medical conditions , will occur during each weekly visit along with ongoing review of symptoms ,medication reviews and reconciliation. Any updates , inconsistencies, discrepancies or acute care concerns will be addressed and routed to the correct Practitioner if indicated   Based on current information and Insurance plan -Reviewed benefits available to patient, including details about eligibility options for care if any area of needs were identified.  Reviewed patients ability to access and / or navigating the benefits system..Amb Referral made if indicted , refer to orders section of note for details   Please refer to Care Plan for goals and interventions -Effectiveness of interventions, symptom management and outcomes will be evaluated  weekly during [facility] 30-day Program Outreach calls  . Any necessary  changes and updates to Care Plan will be completed episodically    Reviewed goals for care Patient verbalizes understanding of instructions and care plan provided. Patient was encouraged to make informed decisions about their care, actively participate in managing their health condition, and implement lifestyle changes as needed to promote independence and self-management of health care   Patient was encouraged to Contact PCP with any changes in baseline or  medication regimen,  changes in health status  /  well-being, safety concerns, including falls any questions or concerns regarding ongoing medical care, any difficulty obtaining or picking up prescriptions, any changes or worsening in condition- including  symptoms not relieved  with interventions    The patient has been provided with contact information for the care management team and has been advised to call with any health-related questions or concerns. Follow up call  with Care Team  as scheduled,or sooner should any new problems arise.   Consent to share  information   The Patient  / Caregiver is aware and gave permission for VBCI CM  to share personal information  including TOC engagement notes with other service providers in connection with care, including accessing and sharing medical, and if applicable, mental health records. The Patient / Caregiver also agreed to a referral ( if indicated) being requested in order to support their identified needs.    Susa Loffler , BSN, RN [facility]   VBCI-Population Health RN Care Manager Direct Dial [phone]  Fax: [phone] Website: [email or web address]

## 2023-07-25 NOTE — Patient Instructions (Signed)
 Visit Information  Thank you for taking time to visit with me today. Please don't hesitate to contact me if I can be of assistance to you before our next scheduled telephone appointment.  Our next appointment is by telephone on 4/2 at 2:30pm   Following is a copy of your care plan:   Goals Addressed             This Visit's Progress    TOC Care Plan       Current Barriers:  Medication management  Provider appointments  Home Health services- [facility] requested SN  Equipment/DME trouble with BiPAP supplies  Functional/Safety -sleeps in recliner   RNCM Clinical Goal(s):  Patient will work with the Care Management team over the next 30 days to address Transition of Care Barriers: Medication Management Support at home Provider appointments Home Health services Equipment/DME take all medications exactly as prescribed and will call provider for medication related questions as evidenced by no missed medication doses  attend all scheduled medical appointments:  as evidenced by no missed follow-up visits  demonstrate ongoing self health care management ability  as evidenced by decreased ER and hospital admissions through collaboration with RN Care manager, provider, and care team.   Interventions: Evaluation of current treatment plan related to  self management and patient's adherence to plan as established by provider  Transitions of Care:  New goal. Sick Day Rules Reviewed Doctor Visits  - discussed the importance of doctor visits Contacted Health RN/OT/PT Frances Furbish [phone] Troubleshoot use of DME in the home  Post discharge activity limitations prescribed by provider reviewed Post-op wound/incision care reviewed with patient/caregiver Reviewed Signs and symptoms of infection  Heart Failure Interventions:  (Status:  New goal.) Short Term Goal Provided education on low sodium diet Assessed need for readable accurate scales in home Provided education about placing scale on  hard, flat surface Advised patient to weigh each morning after emptying bladder Reviewed role of diuretics in prevention of fluid overload and management of heart failure; Discussed the importance of keeping all appointments with provider Advised patient to discuss medication changes and DM supplies  with provider Assessed social determinant of health barriers   Patient Goals/Self-Care Activities: Participate in Transition of Care Program/Attend TOC scheduled calls Take all medications as prescribed Attend all scheduled provider appointments Call pharmacy for medication refills 3-7 days in advance of running out of medications Perform all self care activities independently  Perform IADL's (shopping, preparing meals, housekeeping, managing finances) independently Call provider office for new concerns or questions  call office if I gain more than 2 pounds in one day or 5 pounds in one week keep legs up while sitting use salt in moderation watch for swelling in feet, ankles and legs every day weigh myself daily bring diary to all appointments eat more whole grains, fruits and vegetables, lean meats and healthy fats know when to call the doctor  Follow Up Plan:  Telephone follow up appointment with care management team member scheduled for:  08/01/23 @ 2:30pm  The patient has been provided with contact information for the care management team and has been advised to call with any health related questions or concerns.          Medication review  Reviewed current home medications -- provided education as needed. Patient is aware of potential side effects and was encouraged to notify PCP for any adverse side effects or unwanted symptoms not relieved with interventions  Medication changes and updates reviewed with the  patient  START taking: Metoprolol Tartrate 37.5 MG Tabs Take 1 tablet (37.5 mg total) by mouth 2 (two) times daily. Changes  dextromethorphan-guaiFENesin 30-600 MG 12hr  tablet Commonly known as: MUCINEX DM Take 1 tablet by mouth 2 (two) times daily. What changed:  when to take this reasons to take this   STOP taking: doxycycline 100 MG tablet (VIBRA-TABS) sulfamethoxazole-trimethoprim 800-160 MG tablet (BACTRIM DS)   TAKE these medications     acetaminophen 325 MG tablet Commonly known as: TYLENOL Take 2 tablets (650 mg total) by mouth every 6 (six) hours as needed for mild pain, fever or headache (or Fever >/= 101).    ascorbic acid 500 MG tablet Commonly known as: VITAMIN C Take 500 mg by mouth daily.    aspirin EC 81 MG tablet Take 1 tablet (81 mg total) by mouth daily with breakfast.    CALCIUM-CARB 600 PO Take 600 mg by mouth daily.    cholecalciferol 1000 units tablet Commonly known as: VITAMIN D Take 1,000 Units by mouth daily.    dextromethorphan-guaiFENesin 30-600 MG 12hr tablet Commonly known as: MUCINEX DM Take 1 tablet by mouth 2 (two) times daily. What changed:  when to take this reasons to take this    ferrous sulfate 325 (65 FE) MG tablet Take 325 mg by mouth daily with breakfast.    gabapentin 300 MG capsule Commonly known as: NEURONTIN Take 300 mg by mouth 2 (two) times daily.    Gemtesa 75 MG Tabs Generic drug: Vibegron Take 1 tablet (75 mg total) by mouth daily.    ipratropium-albuterol 0.5-2.5 (3) MG/3ML Soln Commonly known as: DUONEB Take 3 mLs by nebulization 2 (two) times daily.    Metoprolol Tartrate 37.5 MG Tabs Take 1 tablet (37.5 mg total) by mouth 2 (two) times daily.    multivitamin with minerals Tabs tablet Take 1 tablet by mouth daily.    nystatin powder Commonly known as: MYCOSTATIN/NYSTOP APPLY TO AFFECTED AREA TWICE A DAY    OXYGEN Inhale 3 L into the lungs. With long tube mostly 1-1.5    potassium chloride 10 MEQ tablet Commonly known as: KLOR-CON Take 1 tablet (10 mEq total) by mouth daily. Take While taking Demadex/Torsemide    pravastatin 20 MG tablet Commonly known as:  PRAVACHOL Take 20 mg by mouth daily.    QC Tumeric Complex 500 MG Caps Generic drug: Turmeric Take 1 capsule by mouth at bedtime.    silver sulfADIAZINE 1 % cream Commonly known as: SILVADENE Apply 1 Application topically daily as needed (wound).    torsemide 20 MG tablet Commonly known as: DEMADEX Take 2 tablets (40 mg total) by mouth 2 (two) times daily.    venlafaxine 37.5 MG tablet Commonly known as: EFFEXOR Take 37.5 mg by mouth daily.        Patient will call 911 for Medical Emergencies or Life -Threatening Symptoms.  Reviewed goals for care Patient/ Caregiver verbalizes understanding of instructions with the plan of care . The  Patient / Caregiver was encouraged to make informed decisions about care, actively participate in managing health conditions, and implement lifestyle changes as needed to promote independence and self-management of healthcare. SDOH screenings have been completed and addressed if indicted.  There are no reported barriers to care.    Follow-up Plan  Follow-up with Frances Furbish , consider adding Skilled Nursing Follow-up with [employer] for additional supplies for BiPAP/ request new script for supplies. Try to weigh daily notify PCP of weight gain > 2 lbs ON.  Check Heart rate notify Cardiology and PCP id > 100 more that 3 days in row  Continue )2 3-4 L/M  Consider APP mattress for hospital bed  Wound care to sacral wound ( possible Stage 2 ) Use cushion in chair and change positions frequently   [facility] Case Management Nurse will provide follow-up and on-going assessment ,evaluation and education of disease processes, recommended interventions for both chronic and acute medical conditions ,  along with ongoing review of symptoms ,medication reviews / reconciliation during each weekly call . Any updates , inconsistencies, discrepancies or acute care concerns will be addressed and routed to the correct Practitioner if indicated   The patient has been provided  with contact information for the care management team and has been advised to call with any health-related questions or concerns. Follow up call  with Care Team  as scheduled,or sooner should any new problems arise.  [facility]  Please call the care guide team at [phone]  if you need to cancel or reschedule your appointment . For scheduled calls -Three attempts will be made to reach you -if the scheduled call is missed or  we are unable to reach the you after 3 attempts no additional outreach attempts will be made and the TOC follow-up will be closed .   If you need to speak to a Nurse you may  call me directly at the number below or if I am unavailable,and  your need is urgent  please call the main [facility] number at [phone] and ask to speak with one of the [facility] ( Transition of Care )  Nurses  .  Patient was encouraged to Contact PCP with any changes in baseline or  medication regimen,  changes in health status  /  well-being, safety concerns, including falls any questions or concerns regarding ongoing medical care, any difficulty obtaining or picking up prescriptions, any changes or worsening in condition- including  symptoms not relieved  with interventions                                                                            Additionally, If you experience worsening of your symptoms, develop shortness of breath, If you are experiencing a medical emergency,  develop suicidal or homicidal thoughts you must seek medical attention immediately by calling 911 or report to your local emergency department or urgent care.   If you have a non-emergency medical problem during routine business hours, please contact your provider's office and ask to speak with a nurse.       Please take the time to read instructions/literature along with the possible adverse reactions/side effects for all the Medicines that have been prescribed to you. Only take newly prescribed  Medications after you  have completely understood and accept all the possible adverse reactions/side effects.   Do not take more than prescribed Medications for  Pain, Sleep and Anxiety. Do not drive when taking Pain medications or sleep aid/ insomnia  medications It is not advisable to combine anxiety, sleep and pain medications without talking with your primary care practitioner    If you are experiencing a Mental Health or Behavioral Health Crisis or need someone to talk to Please  call the Suicide and Crisis Lifeline: 988 You may also call the [place] National Suicide Prevention Lifeline: 513-283-1815 or TTY: (865)163-4516 TTY 414 447 9572) to talk to a trained counselor.  You may call the Behavioral Health Crisis Line at 513-039-5049, at any time, 24 hours a day, 7 days a week- however If you are in danger or need immediate medical attention, call 911.   If you would like help to quit smoking, call 1-800-QUIT-NOW ( 781 094 4808) OR Espaol: 1-855-Djelo-Ya (7-253-664-4034) o para ms informacin haga clic aqu or Text READY to 742-595 to register via text.   Susa Loffler , BSN, RN [place]   [facility] RN Care Manager Direct Dial [phone]  Website: [email or web address]

## 2023-07-26 LAB — CULTURE, BLOOD (ROUTINE X 2)
Special Requests: ADEQUATE
Special Requests: ADEQUATE

## 2023-07-27 ENCOUNTER — Encounter: Payer: Self-pay | Admitting: Cardiology

## 2023-07-30 DIAGNOSIS — I442 Atrioventricular block, complete: Secondary | ICD-10-CM | POA: Diagnosis not present

## 2023-07-30 DIAGNOSIS — M6281 Muscle weakness (generalized): Secondary | ICD-10-CM | POA: Diagnosis not present

## 2023-07-30 DIAGNOSIS — J9611 Chronic respiratory failure with hypoxia: Secondary | ICD-10-CM | POA: Diagnosis not present

## 2023-07-30 DIAGNOSIS — I69398 Other sequelae of cerebral infarction: Secondary | ICD-10-CM | POA: Diagnosis not present

## 2023-07-30 DIAGNOSIS — R000 Tachycardia, unspecified: Secondary | ICD-10-CM | POA: Diagnosis not present

## 2023-07-30 DIAGNOSIS — I2510 Atherosclerotic heart disease of native coronary artery without angina pectoris: Secondary | ICD-10-CM | POA: Diagnosis not present

## 2023-07-30 DIAGNOSIS — L03119 Cellulitis of unspecified part of limb: Secondary | ICD-10-CM | POA: Diagnosis not present

## 2023-07-30 DIAGNOSIS — J9612 Chronic respiratory failure with hypercapnia: Secondary | ICD-10-CM | POA: Diagnosis not present

## 2023-07-30 DIAGNOSIS — L89312 Pressure ulcer of right buttock, stage 2: Secondary | ICD-10-CM | POA: Diagnosis not present

## 2023-07-30 DIAGNOSIS — I110 Hypertensive heart disease with heart failure: Secondary | ICD-10-CM | POA: Diagnosis not present

## 2023-07-30 DIAGNOSIS — I5032 Chronic diastolic (congestive) heart failure: Secondary | ICD-10-CM | POA: Diagnosis not present

## 2023-07-30 MED ORDER — APIXABAN 5 MG PO TABS: 5.0000 mg | ORAL_TABLET | Freq: Two times a day (BID) | ORAL | 6 refills | Status: DC

## 2023-07-30 NOTE — Telephone Encounter (Signed)
 Biotronik remote available for review. Pt has apt with Dr. Ladona Ridgel 08/15/23.

## 2023-07-30 NOTE — Telephone Encounter (Signed)
 Patient daughter Gustavus Messing and informed and agrees to plan. Verbalized understanding of plan. Eliquis rx sent to [facility] Free 30 day voucher information listed below: Rx Bin: [id number] RxPCN:[id number] GRP: [id number] ID: [phone]

## 2023-07-31 DIAGNOSIS — I442 Atrioventricular block, complete: Secondary | ICD-10-CM | POA: Diagnosis not present

## 2023-07-31 DIAGNOSIS — J9612 Chronic respiratory failure with hypercapnia: Secondary | ICD-10-CM | POA: Diagnosis not present

## 2023-07-31 DIAGNOSIS — I2510 Atherosclerotic heart disease of native coronary artery without angina pectoris: Secondary | ICD-10-CM | POA: Diagnosis not present

## 2023-07-31 DIAGNOSIS — I110 Hypertensive heart disease with heart failure: Secondary | ICD-10-CM | POA: Diagnosis not present

## 2023-07-31 DIAGNOSIS — I69398 Other sequelae of cerebral infarction: Secondary | ICD-10-CM | POA: Diagnosis not present

## 2023-07-31 DIAGNOSIS — J9611 Chronic respiratory failure with hypoxia: Secondary | ICD-10-CM | POA: Diagnosis not present

## 2023-07-31 DIAGNOSIS — M6281 Muscle weakness (generalized): Secondary | ICD-10-CM | POA: Diagnosis not present

## 2023-07-31 DIAGNOSIS — I5032 Chronic diastolic (congestive) heart failure: Secondary | ICD-10-CM | POA: Diagnosis not present

## 2023-07-31 DIAGNOSIS — L89312 Pressure ulcer of right buttock, stage 2: Secondary | ICD-10-CM | POA: Diagnosis not present

## 2023-08-01 ENCOUNTER — Other Ambulatory Visit: Payer: Self-pay

## 2023-08-01 ENCOUNTER — Other Ambulatory Visit: Payer: Self-pay | Admitting: *Deleted

## 2023-08-01 DIAGNOSIS — I5032 Chronic diastolic (congestive) heart failure: Secondary | ICD-10-CM | POA: Diagnosis not present

## 2023-08-01 DIAGNOSIS — I69398 Other sequelae of cerebral infarction: Secondary | ICD-10-CM | POA: Diagnosis not present

## 2023-08-01 DIAGNOSIS — J9611 Chronic respiratory failure with hypoxia: Secondary | ICD-10-CM | POA: Diagnosis not present

## 2023-08-01 DIAGNOSIS — M6281 Muscle weakness (generalized): Secondary | ICD-10-CM | POA: Diagnosis not present

## 2023-08-01 DIAGNOSIS — I110 Hypertensive heart disease with heart failure: Secondary | ICD-10-CM | POA: Diagnosis not present

## 2023-08-01 DIAGNOSIS — L89312 Pressure ulcer of right buttock, stage 2: Secondary | ICD-10-CM | POA: Diagnosis not present

## 2023-08-01 DIAGNOSIS — I2510 Atherosclerotic heart disease of native coronary artery without angina pectoris: Secondary | ICD-10-CM | POA: Diagnosis not present

## 2023-08-01 DIAGNOSIS — I442 Atrioventricular block, complete: Secondary | ICD-10-CM | POA: Diagnosis not present

## 2023-08-01 DIAGNOSIS — J9612 Chronic respiratory failure with hypercapnia: Secondary | ICD-10-CM | POA: Diagnosis not present

## 2023-08-01 MED ORDER — APIXABAN 5 MG PO TABS: 5.0000 mg | ORAL_TABLET | Freq: Two times a day (BID) | ORAL | 6 refills | Status: AC

## 2023-08-01 NOTE — Patient Instructions (Addendum)
 Visit Information  Thank you for taking time to visit with me today. Please don't hesitate to contact me if I can be of assistance to you before our next scheduled telephone appointment.  Our next appointment is by telephone on 4/8 at 1:00pm   Assessment:   Patient/ Caregiver  voices no new complaints or concerns  and has not developed/ reported any new medical issues / Dx or acute changes. - since last follow-up call for most recent   Hospital stay    3/22-3/25  / 2025  No reported or reviewed Hospital Readmissions / No Urgent Care Visits / Transfers for Acute Change in condition .  MD/Specialist or Consultant visits reported since last follow-up call PCP 3/31, Has a follow-up with Cardiology 4/16   Per daughter Patient is  Alert & O x 4 ,responsive, able to voice needs,  There were no noted or reported cognitive/ mental status changes during this assessment General status reviewed  patient presents as expected for age and current situation with  finding as noted in previous sections of assessment  Patient denies changes in ADL function or IADLs  at this time.  She has O2 @ 4 l/m continuous, she [place] using a BiPAP and has supplies from [employer]. Uses Nebulizer and has supplies ( kit and tubing) Patient mood, expressions, emotional tone, insight, judgement and thought content as expected with no notable or reported change No reports of new onset or increased / uncontrolled pain, has chronic pain managed with medicinal and non-medicinal interventions.  No changes in skin, no rashes,  has a stage 2 wound / open area on her sacrum/coccyx Daughter has been using Aquacel that was sent home from hospital and "Butt Paste"  Diet reviewed and education provided as needed to promote good lifestyle choices and healthy meals. Such- as eating plenty of whole grains, nuts, vegetables, and fruits, lean meats, poultry, fish, beans, eggs, and nuts sizes (Unless otherwise restricted) Limit saturated and trans fats,  sodium, and added sugars. Make sure to drink plenty of fluids every day. Be conscious of  portion sizes  Encourage supplemental nutrition for wound healing no weight 259 at PCP visit ( up 2 lbs ) On Torsemide  Therapy Services [facility] [phone])  SN  / PT  /  OT    Medication reconciliation/ review completed based on most recent medication list in EHR; and in review of recent Provider follow-up appointments- confirmed patient obtained / is taking all newly prescribed medications as instructed and is aware of any changes to previous medications regimen including dosage adjustments.  Reports  no new changes in medication regimen or treatment plan during this review period.  Changes noted- Start Eliquis 5 mg twice daily  Stop taking ASA when Eliquis starts ( has a sample pack ordered )  No adverse Drug reactions  reported  HR has been running 99-105   Caregiver manages  medications Patient is able to take medications without difficulty;  denies questions/ concerns and reports no barriers to medication adherence at this time   Patient / Caregiver educated on red flag s/s to watch for based on current discharge diagnosis and was encouraged to report, any changes in baseline or  medication regimen,   or any new unmanaged side effects or symptoms not relieved with interventions  to PCP and / or the  [facility] Case Management team .  Educated Patient's daughter  on signs and symptoms of bleeding and to contact pharmacist / nurse / PCP with any  questions or concerns. Notify  Care Team if you Cough up anything red in color. Develop a Severe headache or stomach ache. Notice dizziness or weakness, bleeding around gums after brushing teeth Blood in the urine or when you have bowel movements may look like black tarry stools, or if you notice any other unusual bleeding, significant bruising, coffee ground looking vomit or a nosebleed that won't bleeding stop within 10 minutes, or cuts / scrapes that won't stop  bleeding after applying pressure, Be aware Some supplements also can increase risk of bleeding Be sure to notify your Chronic Care Team before taking any new ones . -- even if those products are labeled as "safe" or "all natural " Pay extra attention to your diet. Don't   make any major dietary changes  without Speaking to      Following is a copy of your care plan:   Goals Addressed             This Visit's Progress    TOC Care Plan       Current Barriers:  Medication management  Provider appointments  Home Health services- [facility] requested SN  Equipment/DME trouble with BiPAP supplies  Functional/Safety -sleeps in recliner   RNCM Clinical Goal(s):  Patient will work with the Care Management team over the next 30 days to address Transition of Care Barriers: Medication Management Support at home Provider appointments Home Health services Equipment/DME take all medications exactly as prescribed and will call provider for medication related questions as evidenced by no missed medication doses  attend all scheduled medical appointments:  as evidenced by no missed follow-up visits  demonstrate ongoing self health care management ability  as evidenced by decreased ER and hospital admissions through collaboration with RN Care manager, provider, and care team.   Interventions: Evaluation of current treatment plan related to  self management and patient's adherence to plan as established by provider  Transitions of Care:  Goal on track:  Yes. Sick Day Rules Reviewed Doctor Visits  - discussed the importance of doctor visits Contacted Health RN/OT/PT Frances Furbish [phone] Troubleshoot use of DME in the home( nebulizer, BiPAP, O2, Pressure relieving devices   Post discharge activity limitations prescribed by provider reviewed Post-op wound/incision care reviewed with patient/caregiver Reviewed Signs and symptoms of infection  Heart Failure Interventions:  (Status:  Goal on track:   Yes.) Short Term Goal Provided education on low sodium diet Assessed need for readable accurate scales in home Provided education about placing scale on hard, flat surface Advised patient to weigh each morning after emptying bladder Reviewed role of diuretics in prevention of fluid overload and management of heart failure; Discussed the importance of keeping all appointments with provider Advised patient to discuss medication changes and DM supplies  with provider Assessed social determinant of health barriers   Patient Goals/Self-Care Activities: Participate in Transition of Care Program/Attend TOC scheduled calls Take all medications as prescribed Attend all scheduled provider appointments Call pharmacy for medication refills 3-7 days in advance of running out of medications Perform all self care activities independently  Perform IADL's (shopping, preparing meals, housekeeping, managing finances) independently Call provider office for new concerns or questions  call office if I gain more than 2 pounds in one day or 5 pounds in one week keep legs up while sitting use salt in moderation watch for swelling in feet, ankles and legs every day weigh myself daily bring diary to all appointments eat more whole grains, fruits and vegetables, lean  meats and healthy fats know when to call the doctor  Follow Up Plan:  Telephone follow up appointment with care management team member scheduled for:  08/07/23 @ 1:00pm  The patient has been provided with contact information for the care management team and has been advised to call with any health related questions or concerns.          Medication review  Reviewed current home medications -- provided education as needed. Patient is aware of potential side effects and was encouraged to notify PCP for any adverse side effects or unwanted symptoms not relieved with interventions    Reviewed goals for care Patient/ Caregiver verbalizes  understanding of instructions with the plan of care . The  Patient / Caregiver was encouraged to make informed decisions about care, actively participate in managing health conditions, and implement lifestyle changes as needed to promote independence and self-management of healthcare. SDOH screenings have been completed and addressed if indicted.  There are no reported barriers to care.    Follow-up Plan  Pick up and start Eliquis - discontinue ASA when start Eliquis) Reviewed bleeding precautions  Continue [facility] SN vists - Have Nurse check weight, check LE for edema, Wound check on sacral area. Check B/P and AHR ( recent med changes , mild swelling LE )  [facility] Case Management Nurse will provide follow-up and on-going assessment ,evaluation and education of disease processes, recommended interventions for both chronic and acute medical conditions ,  along with ongoing review of symptoms ,medication reviews / reconciliation during each weekly call . Any updates , inconsistencies, discrepancies or acute care concerns will be addressed and routed to the correct Practitioner if indicated     [facility]  Please call the care guide team at [phone]  if you need to cancel or reschedule your appointment . For scheduled calls -Three attempts will be made to reach you -if the scheduled call is missed or  we are unable to reach the you after 3 attempts no additional outreach attempts will be made and the TOC follow-up will be closed .   If you need to speak to a Nurse you may  call me directly at the number below or if I am unavailable,and  your need is urgent  please call the main [facility] number at [phone] and ask to speak with one of the [facility] ( Transition of Care )  Nurses  .  Patient was encouraged to Contact PCP with any changes in baseline or  medication regimen,  changes in health status  /  well-being, safety concerns, including falls any questions or concerns regarding  ongoing medical care, any difficulty obtaining or picking up prescriptions, any changes or worsening in condition- including  symptoms not relieved  with interventions                                                                            Additionally, If you experience worsening of your symptoms, develop shortness of breath, If you are experiencing a medical emergency,  develop suicidal or homicidal thoughts you must seek medical attention immediately by calling 911 or report to your local emergency department or urgent care.   If you  have a non-emergency medical problem during routine business hours, please contact your provider's office and ask to speak with a nurse.       Please take the time to read instructions/literature along with the possible adverse reactions/side effects for all the Medicines that have been prescribed to you. Only take newly prescribed  Medications after you have completely understood and accept all the possible adverse reactions/side effects.   Do not take more than prescribed Medications for  Pain, Sleep and Anxiety. Do not drive when taking Pain medications or sleep aid/ insomnia  medications It is not advisable to combine anxiety, sleep and pain medications without talking with your primary care practitioner    If you are experiencing a Mental Health or Behavioral Health Crisis or need someone to talk to Please call the Suicide and Crisis Lifeline: 988 You may also call the [place] National Suicide Prevention Lifeline: 330-512-0349 or TTY: 249-684-0154 TTY (213) 603-4285) to talk to a trained counselor.  You may call the Behavioral Health Crisis Line at 567-179-1284, at any time, 24 hours a day, 7 days a week- however If you are in danger or need immediate medical attention, call 911.   If you would like help to quit smoking, call 1-800-QUIT-NOW ( (938) 313-6395) OR Espaol: 1-855-Djelo-Ya (9-563-875-6433) o para ms informacin haga clic aqu or Text READY to  200-400 to register via text.   The patient has been provided with contact information for the care management team and has been advised to call with any health-related questions or concerns. Follow up call  with Care Team  as scheduled,or sooner should any new problems arise.  Susa Loffler , BSN, RN [place]   [facility]Population Health RN Care Manager Direct Dial [phone]  Website: [email or web address]

## 2023-08-01 NOTE — Patient Outreach (Signed)
 Care Management  Transitions of Care Program Transitions of Care Post-discharge week 2   08/01/2023 Name: Dawn Gonzales MRN: [id number] DOB: [birth year]  Subjective: Dawn Gonzales is a 83 y.o. year old female who is a primary care patient of Benita Stabile, MD. The Care Management team Engaged with patient Engaged with patient by telephone to assess and address transitions of care needs.   Consent to Services:  Patient was given information about care management services, agreed to services, and gave verbal consent to participate.   Assessment:   Patient/ Caregiver  voices no new complaints or concerns  and has not developed/ reported any new medical issues / Dx or acute changes. - since last follow-up call for most recent   Hospital stay    3/22-3/25  / 2025  No reported or reviewed Hospital Readmissions / No Urgent Care Visits / Transfers for Acute Change in condition .  MD/Specialist or Consultant visits reported since last follow-up call PCP 3/31, Has a follow-up with Cardiology 4/16   Per daughter Patient is  Alert & O x 4 ,responsive, able to voice needs,  There were no noted or reported cognitive/ mental status changes during this assessment General status reviewed  patient presents as expected for age and current situation with  finding as noted in previous sections of assessment  Patient denies changes in ADL function or IADLs  at this time.  She has O2 @ 4 l/m continuous, she [place] using a BiPAP and has supplies from [employer]. Uses Nebulizer and has supplies ( kit and tubing) Patient mood, expressions, emotional tone, insight, judgement and thought content as expected with no notable or reported change No reports of new onset or increased / uncontrolled pain, has chronic pain managed with medicinal and non-medicinal interventions.  No changes in skin, no rashes,  has a stage 2 wound / open area on her sacrum/coccyx Daughter has been using Aquacel that was sent home from  hospital and "Butt Paste"  Diet reviewed and education provided as needed to promote good lifestyle choices and healthy meals. Such- as eating plenty of whole grains, nuts, vegetables, and fruits, lean meats, poultry, fish, beans, eggs, and nuts sizes (Unless otherwise restricted) Limit saturated and trans fats, sodium, and added sugars. Make sure to drink plenty of fluids every day. Be conscious of  portion sizes  Encourage supplemental nutrition for wound healing no weight 259 at PCP visit ( up 2 lbs ) [facility] [place]  [facility] [phone])  SN  / PT  /  OT    Medication reconciliation/ review completed based on most recent medication list in EHR; and in review of recent Provider follow-up appointments- confirmed patient obtained / is taking all newly prescribed medications as instructed and is aware of any changes to previous medications regimen including dosage adjustments.  Reports  no new changes in medication regimen or treatment plan during this review period.  Changes noted- Start Eliquis 5 mg twice daily  Stop taking ASA when Eliquis starts ( has a sample pack ordered )  No adverse Drug reactions  reported  HR has been running 99-105   Caregiver manages  medications Patient is able to take medications without difficulty;  denies questions/ concerns and reports no barriers to medication adherence at this time   Patient / Caregiver educated on red flag s/s to watch for based on current discharge diagnosis and was encouraged to report, any changes in baseline or  medication regimen,  or any new unmanaged side effects or symptoms not relieved with interventions  to PCP and / or the  [facility] Case Management team .  Educated Patient's daughter  on signs and symptoms of bleeding and to contact pharmacist / nurse / PCP with any questions or concerns. Notify  Care Team if you Cough up anything red in color. Develop a Severe headache or stomach ache. Notice dizziness or weakness,  bleeding around gums after brushing teeth Blood in the urine or when you have bowel movements may look like black tarry stools, or if you notice any other unusual bleeding, significant bruising, coffee ground looking vomit or a nosebleed that won't bleeding stop within 10 minutes, or cuts / scrapes that won't stop bleeding after applying pressure, Be aware Some supplements also can increase risk of bleeding Be sure to notify your Chronic Care Team before taking any new ones . -- even if those products are labeled as "safe" or "all natural " Pay extra attention to your diet. Don't   make any major dietary changes  without Speaking to PCP          SDOH Interventions    Flowsheet Row Telephone from 07/25/2023 in [place] POPULATION HEALTH DEPARTMENT  SDOH Interventions   Food Insecurity Interventions Intervention Not Indicated  Housing Interventions Intervention Not Indicated  Transportation Interventions Intervention Not Indicated, Patient Resources (Friends/Family), Payor Benefit  Utilities Interventions Intervention Not Indicated        Goals Addressed             This Visit's Progress    TOC Care Plan       Current Barriers:  Medication management  Provider appointments  Home Health services- [facility] requested SN  Equipment/DME trouble with BiPAP supplies  Functional/Safety -sleeps in recliner   RNCM Clinical Goal(s):  Patient will work with the Care Management team over the next 30 days to address Transition of Care Barriers: Medication Management Support at home Provider appointments Home Health services Equipment/DME take all medications exactly as prescribed and will call provider for medication related questions as evidenced by no missed medication doses  attend all scheduled medical appointments:  as evidenced by no missed follow-up visits  demonstrate ongoing self health care management ability  as evidenced by decreased ER and hospital admissions through  collaboration with RN Care manager, provider, and care team.   Interventions: Evaluation of current treatment plan related to  self management and patient's adherence to plan as established by provider  Transitions of Care:  Goal on track:  Yes. Sick Day Rules Reviewed Doctor Visits  - discussed the importance of doctor visits Contacted Health RN/OT/PT Frances Furbish [phone] Troubleshoot use of DME in the home( nebulizer, BiPAP, O2, Pressure relieving devices   Post discharge activity limitations prescribed by provider reviewed Post-op wound/incision care reviewed with patient/caregiver Reviewed Signs and symptoms of infection  Heart Failure Interventions:  (Status:  Goal on track:  Yes.) Short Term Goal Provided education on low sodium diet Assessed need for readable accurate scales in home Provided education about placing scale on hard, flat surface Advised patient to weigh each morning after emptying bladder Reviewed role of diuretics in prevention of fluid overload and management of heart failure; Discussed the importance of keeping all appointments with provider Advised patient to discuss medication changes and DM supplies  with provider Assessed social determinant of health barriers   Patient Goals/Self-Care Activities: Participate in Transition of Care Program/Attend TOC scheduled calls Take all medications as  prescribed Attend all scheduled provider appointments Call pharmacy for medication refills 3-7 days in advance of running out of medications Perform all self care activities independently  Perform IADL's (shopping, preparing meals, housekeeping, managing finances) independently Call provider office for new concerns or questions  call office if I gain more than 2 pounds in one day or 5 pounds in one week keep legs up while sitting use salt in moderation watch for swelling in feet, ankles and legs every day weigh myself daily bring diary to all appointments eat more  whole grains, fruits and vegetables, lean meats and healthy fats know when to call the doctor  Follow Up Plan:  Telephone follow up appointment with care management team member scheduled for:  08/07/23 @ 1:00pm  The patient has been provided with contact information for the care management team and has been advised to call with any health related questions or concerns.          Plan:  Pick up and start Eliquis - discontinue ASA when start Eliquis) Reviewed bleeding precautions  Continue [facility] SN vists - Have Nurse check weight, check LE for edema, Wound check on sacral area. Check B/P and AHR ( recent med changes , mild swelling LE )  Routine follow-up and on-going assessment evaluation and education of disease processes, recommended interventions for both chronic and acute medical conditions , will occur during each weekly visit along with ongoing review of symptoms ,medication reviews and reconciliation. Any updates , inconsistencies, discrepancies or acute care concerns will be addressed and routed to the correct Practitioner if indicated   Based on current information and Insurance plan -Reviewed benefits available to patient, including details about eligibility options for care if any area of needs were identified.  Reviewed patients ability to access and / or navigating the benefits system..Amb Referral made if indicted , refer to orders section of note for details   Please refer to Care Plan for goals and interventions -Effectiveness of interventions, symptom management and outcomes will be evaluated  weekly during [facility] 30-day Program Outreach calls  . Any necessary  changes and updates to Care Plan will be completed episodically    Reviewed goals for care Patient verbalizes understanding of instructions and care plan provided. Patient was encouraged to make informed decisions about their care, actively participate in managing their health condition, and implement lifestyle  changes as needed to promote independence and self-management of health care   Patient was encouraged to Contact PCP with any changes in baseline or  medication regimen,  changes in health status  /  well-being, safety concerns, including falls any questions or concerns regarding ongoing medical care, any difficulty obtaining or picking up prescriptions, any changes or worsening in condition- including  symptoms not relieved  with interventions    The patient has been provided with contact information for the care management team and has been advised to call with any health-related questions or concerns. Follow up call  with Care Team  as scheduled,or sooner should any new problems arise.      The patient has been provided with contact information for the care management team and has been advised to call with any health related questions or concerns.   Susa Loffler , BSN, RN [facility]   VBCI-Population Health RN Care Manager Direct Dial [phone]  Fax: [phone] Website: [email or web address]

## 2023-08-04 ENCOUNTER — Other Ambulatory Visit: Payer: Self-pay | Admitting: Cardiology

## 2023-08-04 DIAGNOSIS — G4733 Obstructive sleep apnea (adult) (pediatric): Secondary | ICD-10-CM | POA: Diagnosis not present

## 2023-08-04 DIAGNOSIS — G44009 Cluster headache syndrome, unspecified, not intractable: Secondary | ICD-10-CM | POA: Diagnosis not present

## 2023-08-07 ENCOUNTER — Other Ambulatory Visit: Payer: Self-pay

## 2023-08-07 DIAGNOSIS — I442 Atrioventricular block, complete: Secondary | ICD-10-CM | POA: Diagnosis not present

## 2023-08-07 DIAGNOSIS — L89312 Pressure ulcer of right buttock, stage 2: Secondary | ICD-10-CM | POA: Diagnosis not present

## 2023-08-07 DIAGNOSIS — J9611 Chronic respiratory failure with hypoxia: Secondary | ICD-10-CM | POA: Diagnosis not present

## 2023-08-07 DIAGNOSIS — I509 Heart failure, unspecified: Secondary | ICD-10-CM | POA: Diagnosis not present

## 2023-08-07 DIAGNOSIS — I5032 Chronic diastolic (congestive) heart failure: Secondary | ICD-10-CM | POA: Diagnosis not present

## 2023-08-07 DIAGNOSIS — G629 Polyneuropathy, unspecified: Secondary | ICD-10-CM | POA: Diagnosis not present

## 2023-08-07 DIAGNOSIS — I2510 Atherosclerotic heart disease of native coronary artery without angina pectoris: Secondary | ICD-10-CM | POA: Diagnosis not present

## 2023-08-07 DIAGNOSIS — I890 Lymphedema, not elsewhere classified: Secondary | ICD-10-CM | POA: Diagnosis not present

## 2023-08-07 DIAGNOSIS — I110 Hypertensive heart disease with heart failure: Secondary | ICD-10-CM | POA: Diagnosis not present

## 2023-08-07 DIAGNOSIS — J9612 Chronic respiratory failure with hypercapnia: Secondary | ICD-10-CM | POA: Diagnosis not present

## 2023-08-07 DIAGNOSIS — M1990 Unspecified osteoarthritis, unspecified site: Secondary | ICD-10-CM | POA: Diagnosis not present

## 2023-08-07 DIAGNOSIS — Z9181 History of falling: Secondary | ICD-10-CM | POA: Diagnosis not present

## 2023-08-07 DIAGNOSIS — M6281 Muscle weakness (generalized): Secondary | ICD-10-CM | POA: Diagnosis not present

## 2023-08-07 DIAGNOSIS — I69398 Other sequelae of cerebral infarction: Secondary | ICD-10-CM | POA: Diagnosis not present

## 2023-08-07 NOTE — Patient Instructions (Signed)
 Visit Information  Thank you for taking time to visit with me today. Please don't hesitate to contact me if I can be of assistance to you before our next scheduled telephone appointment.  Our next appointment is by telephone on 08/15/23  at 2:30pm  Following is a copy of your care plan:   Goals Addressed             This Visit's Progress    TOC Care Plan       Current Barriers:  Medication management  Provider appointments - PCP 3/31 Cardiology 4/16  Will schedule Pulmonology  Home Health services- [facility]  and  SN  Equipment/DME trouble with BiPAP supplies reviewed possible portable O2 concentrator  Functional/Safety -sleeps in recliner   RNCM Clinical Goal(s):  Patient will work with the Care Management team over the next 30 days to address Transition of Care Barriers: Medication Management Support at home Provider appointments Home Health services Equipment/DME take all medications exactly as prescribed and will call provider for medication related questions as evidenced by no missed medication doses  attend all scheduled medical appointments:  as evidenced by no missed follow-up visits  demonstrate ongoing self health care management ability  as evidenced by decreased ER and hospital admissions through collaboration with RN Care manager, provider, and care team.   Interventions: Evaluation of current treatment plan related to  self management and patient's adherence to plan as established by provider  Transitions of Care:  Goal on track:  Yes. Sick Day Rules Reviewed Doctor Visits  - discussed the importance of doctor visits Contacted Health RN/OT/PT Frances Furbish [phone] Troubleshoot use of DME in the home( nebulizer, BiPAP, O2, Pressure relieving devices )  Post discharge activity limitations prescribed by provider reviewed Post-op wound/incision care reviewed with patient/caregiver Reviewed Signs and symptoms of infection  Heart Failure Interventions:  (Status:   Goal on track:  Yes.) Short Term Goal Provided education on low sodium diet Assessed need for readable accurate scales in home Provided education about placing scale on hard, flat surface Advised patient to weigh each morning after emptying bladder Reviewed role of diuretics in prevention of fluid overload and management of heart failure; Discussed the importance of keeping all appointments with provider Advised patient to discuss medication changes and DM supplies  with provider Assessed social determinant of health barriers   Patient Goals/Self-Care Activities: Participate in Transition of Care Program/Attend TOC scheduled calls Take all medications as prescribed Attend all scheduled provider appointments Call pharmacy for medication refills 3-7 days in advance of running out of medications Perform all self care activities independently  Perform IADL's (shopping, preparing meals, housekeeping, managing finances) independently Call provider office for new concerns or questions  call office if I gain more than 2 pounds in one day or 5 pounds in one week keep legs up while sitting use salt in moderation watch for swelling in feet, ankles and legs every day weigh myself daily bring diary to all appointments eat more whole grains, fruits and vegetables, lean meats and healthy fats know when to call the doctor  Follow Up Plan:  Telephone follow up appointment with care management team member scheduled for:  08/15/23 @ 2:30pm  The patient has been provided with contact information for the care management team and has been advised to call with any health related questions or concerns.          Medication review  Reviewed current home medications -- provided education as needed. Patient is aware of  potential side effects and was encouraged to notify PCP for any adverse side effects or unwanted symptoms not relieved with interventions Medication reconciliation/ review completed based on  most recent medication list in EHR; and in review of recent Provider follow-up appointments- confirmed patient obtained / is taking all newly prescribed medications as instructed and is aware of any changes to previous medications regimen including dosage adjustments.   Reports new changes in medication regimen or treatment plan during this review period.  Changes noted-Added Eliquis  BID  Medications and current treatments are effective for use intended. No adverse Drug reactions  reported No changes or modifications indicated at this time.   Caregiver manages  medications Patient is able to take medications without difficulty;  denies questions/ concerns and reports no barriers to medication adherence at this time   Reviewed goals for care Patient/ Caregiver verbalizes understanding of instructions with the plan of care . The  Patient / Caregiver was encouraged to make informed decisions about care, actively participate in managing health conditions, and implement lifestyle changes as needed to promote independence and self-management of healthcare. SDOH screenings have been completed and addressed if indicted.  There are no reported barriers to care.    Follow-up Plan Specialty provider follow-up Cardiology 4/16 Consider Pulmonology follow-up consider  Portable concentrator  DME requests:  splint/brace for left ankle if additional support is needed PRN Pain medication  wheelchair Home Health requests: Physical therapy Skilled Nursing  [facility] Case Management Nurse will provide follow-up and on-going assessment ,evaluation and education of disease processes, recommended interventions for both chronic and acute medical conditions ,  along with ongoing review of symptoms ,medication reviews / reconciliation during each weekly call . Any updates , inconsistencies, discrepancies or acute care concerns will be addressed and routed to the correct Practitioner if indicated     [facility]  Please call the care guide team at [phone]  if you need to cancel or reschedule your appointment . For scheduled calls -Three attempts will be made to reach you -if the scheduled call is missed or  we are unable to reach the you after 3 attempts no additional outreach attempts will be made and the TOC follow-up will be closed .   If you need to speak to a Nurse you may  call me directly at the number below or if I am unavailable,and  your need is urgent  please call the main [facility]I number at [phone] and ask to speak with one of the [facility] ( Transition of Care )  Nurses  .  Patient was encouraged to Contact PCP with any changes in baseline or  medication regimen,  changes in health status  /  well-being, safety concerns, including falls any questions or concerns regarding ongoing medical care, any difficulty obtaining or picking up prescriptions, any changes or worsening in condition- including  symptoms not relieved  with interventions                                                                            Additionally, If you experience worsening of your symptoms, develop shortness of breath, If you are experiencing a medical emergency,  develop suicidal or homicidal thoughts  you must seek medical attention immediately by calling 911 or report to your local emergency department or urgent care.   If you have a non-emergency medical problem during routine business hours, please contact your provider's office and ask to speak with a nurse.       Please take the time to read instructions/literature along with the possible adverse reactions/side effects for all the Medicines that have been prescribed to you. Only take newly prescribed  Medications after you have completely understood and accept all the possible adverse reactions/side effects.   Do not take more than prescribed Medications for  Pain, Sleep and Anxiety. Do not drive when taking Pain medications or sleep aid/ insomnia   medications It is not advisable to combine anxiety, sleep and pain medications without talking with your primary care practitioner    If you are experiencing a Mental Health or Behavioral Health Crisis or need someone to talk to Please call the Suicide and Crisis Lifeline: 988 You may also call the [place] National Suicide Prevention Lifeline: (402)088-5829 or TTY: 315-151-2993 TTY 772-602-2337) to talk to a trained counselor.  You may call the Behavioral Health Crisis Line at (412) 774-3916, at any time, 24 hours a day, 7 days a week- however If you are in danger or need immediate medical attention, call 911.   If you would like help to quit smoking, call 1-800-QUIT-NOW ( 518-142-9323) OR Espaol: 1-855-Djelo-Ya (3-875-643-3295) o para ms informacin haga clic aqu or Text READY to 188-416 to register via text.   The patient has been provided with contact information for the care management team and has been advised to call with any health-related questions or concerns. Follow up call  with Care Team  as scheduled,or sooner should any new problems arise.  Susa Loffler , BSN, RN [place]   VBCI-Population Health RN Care Manager Direct Dial [phone]  Website: [email or web address]

## 2023-08-07 NOTE — Patient Outreach (Signed)
 Transition of Care week 3  Visit Note  08/07/2023  Name: Dawn Gonzales MRN: [id number]          DOB: 01-02-1941  Situation: Patient enrolled in [facility] 30-day program. Visit completed with patient and her daughter Huntley Dec by telephone.   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 - since last follow-up call for most recent   Hospital stay    3/22-2/25   / 2025  No reported or reviewed Hospital Readmissions / No Urgent Care Visits / Transfers for Acute Change in condition .   MD/Specialist or Consultant visits reported since last follow-up call PCP 07/30/23     Patient was Alert & O x 4 ,responsive, able to voice needs, cooperative with visit   There were no noted or reported cognitive/ mental status changes during this assessment General status reviewed  patient presents as expected for age and current situation with  finding as noted in previous sections of assessment  Patient denies changes in ADL function or IADLs  at this time.   Patient mood, expressions, emotional tone, insight, judgement  and thought content as expected with no notable or reported change No reports of new onset or increased /  uncontrolled pain, has chronic pain managed with medicinal and non-medicinal interventions.  No changes in skin, no rashes, wounds or open areas  Diet reviewed and education provided as needed to promote good lifestyle choices and healthy meals. Such- as eating plenty of whole grains, nuts, vegetables, and fruits, lean meats, poultry, fish, beans, eggs, and nuts sizes (Unless otherwise restricted) Limit saturated and trans fats, sodium, and added sugars. Make sure to drink plenty of fluids every day. Be conscious of  portion sizes  (if indicated- Follow any recommended Fluid restrictions )  Therapy Services  SN  / PT     Medication reconciliation/ review completed based on most recent medication list in EHR; and in review of recent Provider follow-up appointments- confirmed patient obtained / is taking all newly prescribed medications as instructed and is aware of any changes to previous medications regimen including dosage adjustments.  Reports new changes in medication regimen or treatment plan during this review period.  Changes noted-Added Eliquis  BID  Medications and current treatments are effective for use intended. No adverse Drug reactions  reported No changes or modifications indicated at this time.  Caregiver manages  medications Patient is able to take medications without difficulty;  denies questions/ concerns and reports no barriers to medication adherence at this time   Patient / Caregiver educated on red flag s/s to  watch for based on current discharge diagnosis  and Anticoagulant use and was encouraged to report, any changes in baseline or  medication regimen,   or any new unmanaged side effects or symptoms not relieved with interventions  to PCP and / or the  [facility] Case Management team .   Patient Reported Symptoms:  Cognitive Alert and Oriented x 4   Neurological No symptoms reported    HEENT No symptoms reported    Cardiovascular Irregular pulse    Respiratory  Other: Sat's  93-94% on 4 l/m  Endocrine Fast heartbeat    Gastrointestinal No symptoms reported    Genitourinary No symptoms reported    Integumentary Wound    Musculoskeletal Difficulty walking, Muscle pain, Unsteady gait, Weakness    Psychosocial No symptoms reported     Vitals:   08/07/23 1330  Pulse: 60    Medications Reviewed Today     Reviewed by Johnnette Barrios, RN (Registered Nurse) on 08/07/23 at 1326  Med List Status: <None>   Medication Order Taking? Sig Documenting Provider Last Dose Status Informant  acetaminophen (TYLENOL) 325 MG tablet [id number] Yes Take 2 tablets (650 mg total) by mouth every 6 (six) hours as needed for mild pain, fever or headache (or Fever >/= 101). Shon Hale, MD Taking Active Child  apixaban (ELIQUIS) 5 MG TABS tablet [id number] Yes Take 1 tablet (5 mg total) by mouth 2 (two) times daily. Jonelle Sidle, MD Taking Active   Calcium Carbonate (CALCIUM-CARB 600 PO) [id number] Yes Take 600 mg by mouth daily. [provider] Taking Active Child  cholecalciferol (VITAMIN D) 1000 units tablet [id number] Yes Take 1,000 Units by mouth daily. [provider] Taking Active Child  dextromethorphan-guaiFENesin (MUCINEX DM) 30-600 MG 12hr tablet [id number] Yes Take 1 tablet by mouth 2 (two) times daily.  Patient taking differently: Take 1 tablet by mouth 2 (two) times daily as needed for cough.   Shon Hale, MD Taking Active Child  ferrous sulfate 325 (65 FE) MG tablet [id number] Yes Take 325 mg by mouth daily with breakfast. [provider] Taking Active Child  gabapentin (NEURONTIN) 300 MG capsule [id number] Yes Take 300 mg by mouth 2 (two) times daily. [provider] Taking Active Child           Med Note Sharlene Dory   Wed Apr 05, 2022 12:16 PM)    ipratropium-albuterol (DUONEB) 0.5-2.5 (3) MG/3ML SOLN [id number] Yes Take 3 mLs by nebulization 2 (two) times daily. Kendell Bane, MD Taking Active  Child  metoprolol tartrate 37.5 MG TABS [id number] Yes Take 1 tablet (37.5 mg total) by mouth 2 (two) times daily. Catarina Hartshorn, MD Taking Active   Multiple Vitamin (MULTIVITAMIN WITH MINERALS) TABS tablet [id number] Yes Take 1 tablet by mouth daily. [provider] Taking Active Child  nystatin (MYCOSTATIN/NYSTOP) powder [id number] Yes APPLY TO AFFECTED AREA TWICE A DAY [provider] Taking Active Child           Med Note Elesa Massed, ANGELICA G   Sat Jul 21, 2023  7:26 PM)    OXYGEN [id number] Yes Inhale 3 L into the lungs. With long tube mostly 1-1.5 [provider] Taking Active Child  potassium chloride (KLOR-CON) 10 MEQ tablet [id number] Yes Take 1 tablet (10 mEq total) by mouth daily. Take While taking Demadex/Torsemide Shon Hale, MD Taking Active Child  pravastatin (PRAVACHOL) 20 MG tablet [id number] Yes Take 20 mg by mouth daily. [provider] Taking Active Child  silver sulfADIAZINE (  SILVADENE) 1 % cream [id number] Yes Apply 1 Application topically daily as needed (wound). [provider] Taking Active Child  torsemide (DEMADEX) 20 MG tablet [id number] Yes TAKE 2 TABLETS BY MOUTH 2 TIMES DAILY. Jonelle Sidle, MD Taking Active   Turmeric (QC TUMERIC COMPLEX) 500 MG CAPS [id number] Yes Take 1 capsule by mouth at bedtime. [provider] Taking Active Child  venlafaxine (EFFEXOR) 37.5 MG tablet [id number] Yes Take 37.5 mg by mouth daily. [provider] Taking Active Child           Med Note Sharlene Dory   Wed Apr 05, 2022 12:16 PM)    Vibegron (GEMTESA) 75 MG TABS [id number] Yes Take 1 tablet (75 mg total) by mouth daily. McKenzie, Mardene Celeste, MD Taking Active Child  vitamin C (ASCORBIC ACID) 500 MG tablet [id number] Yes Take 500 mg by mouth daily. [provider] Taking Active Child          Medication reconciliation/ review completed based on most recent medication list in EHR; and in review of recent Provider  follow-up appointments- confirmed patient obtained / is taking all newly prescribed medications as instructed and is aware of any changes to previous medications regimen including dosage adjustments. Patient self-Caregiver manages medications; denies questions/ concerns or barriers to medication adherence at this time.  Recommendation:   Specialty provider follow-up Cardiology 4/16 Consider Pulmonology follow-up consider  Portable concentrator  DME requests:  splint/brace wheelchair Home Health requests: Physical therapy Skilled Nursing  Follow Up Plan:   Telephone follow up appointment date/time:  08/15/23 @ 2:30pm   Susa Loffler , BSN, RN [facility]   [facility]-Population Health RN Care Manager Direct Dial [phone]  Fax: [phone] Website: [email or web address]

## 2023-08-08 DIAGNOSIS — J9611 Chronic respiratory failure with hypoxia: Secondary | ICD-10-CM | POA: Diagnosis not present

## 2023-08-08 DIAGNOSIS — I442 Atrioventricular block, complete: Secondary | ICD-10-CM | POA: Diagnosis not present

## 2023-08-08 DIAGNOSIS — I2510 Atherosclerotic heart disease of native coronary artery without angina pectoris: Secondary | ICD-10-CM | POA: Diagnosis not present

## 2023-08-08 DIAGNOSIS — J9612 Chronic respiratory failure with hypercapnia: Secondary | ICD-10-CM | POA: Diagnosis not present

## 2023-08-08 DIAGNOSIS — I69398 Other sequelae of cerebral infarction: Secondary | ICD-10-CM | POA: Diagnosis not present

## 2023-08-08 DIAGNOSIS — M6281 Muscle weakness (generalized): Secondary | ICD-10-CM | POA: Diagnosis not present

## 2023-08-08 DIAGNOSIS — L89312 Pressure ulcer of right buttock, stage 2: Secondary | ICD-10-CM | POA: Diagnosis not present

## 2023-08-08 DIAGNOSIS — I5032 Chronic diastolic (congestive) heart failure: Secondary | ICD-10-CM | POA: Diagnosis not present

## 2023-08-08 DIAGNOSIS — I110 Hypertensive heart disease with heart failure: Secondary | ICD-10-CM | POA: Diagnosis not present

## 2023-08-09 DIAGNOSIS — I2510 Atherosclerotic heart disease of native coronary artery without angina pectoris: Secondary | ICD-10-CM | POA: Diagnosis not present

## 2023-08-09 DIAGNOSIS — I442 Atrioventricular block, complete: Secondary | ICD-10-CM | POA: Diagnosis not present

## 2023-08-09 DIAGNOSIS — L89312 Pressure ulcer of right buttock, stage 2: Secondary | ICD-10-CM | POA: Diagnosis not present

## 2023-08-09 DIAGNOSIS — I69398 Other sequelae of cerebral infarction: Secondary | ICD-10-CM | POA: Diagnosis not present

## 2023-08-09 DIAGNOSIS — J9612 Chronic respiratory failure with hypercapnia: Secondary | ICD-10-CM | POA: Diagnosis not present

## 2023-08-09 DIAGNOSIS — I110 Hypertensive heart disease with heart failure: Secondary | ICD-10-CM | POA: Diagnosis not present

## 2023-08-09 DIAGNOSIS — J9611 Chronic respiratory failure with hypoxia: Secondary | ICD-10-CM | POA: Diagnosis not present

## 2023-08-09 DIAGNOSIS — M6281 Muscle weakness (generalized): Secondary | ICD-10-CM | POA: Diagnosis not present

## 2023-08-09 DIAGNOSIS — I5032 Chronic diastolic (congestive) heart failure: Secondary | ICD-10-CM | POA: Diagnosis not present

## 2023-08-14 DIAGNOSIS — I110 Hypertensive heart disease with heart failure: Secondary | ICD-10-CM | POA: Diagnosis not present

## 2023-08-14 DIAGNOSIS — I69398 Other sequelae of cerebral infarction: Secondary | ICD-10-CM | POA: Diagnosis not present

## 2023-08-14 DIAGNOSIS — M6281 Muscle weakness (generalized): Secondary | ICD-10-CM | POA: Diagnosis not present

## 2023-08-14 DIAGNOSIS — L89312 Pressure ulcer of right buttock, stage 2: Secondary | ICD-10-CM | POA: Diagnosis not present

## 2023-08-14 DIAGNOSIS — J9612 Chronic respiratory failure with hypercapnia: Secondary | ICD-10-CM | POA: Diagnosis not present

## 2023-08-14 DIAGNOSIS — I2510 Atherosclerotic heart disease of native coronary artery without angina pectoris: Secondary | ICD-10-CM | POA: Diagnosis not present

## 2023-08-14 DIAGNOSIS — J9611 Chronic respiratory failure with hypoxia: Secondary | ICD-10-CM | POA: Diagnosis not present

## 2023-08-14 DIAGNOSIS — I5032 Chronic diastolic (congestive) heart failure: Secondary | ICD-10-CM | POA: Diagnosis not present

## 2023-08-14 DIAGNOSIS — I442 Atrioventricular block, complete: Secondary | ICD-10-CM | POA: Diagnosis not present

## 2023-08-15 ENCOUNTER — Other Ambulatory Visit: Payer: Self-pay

## 2023-08-15 ENCOUNTER — Ambulatory Visit: Admitting: Internal Medicine

## 2023-08-15 DIAGNOSIS — I5032 Chronic diastolic (congestive) heart failure: Secondary | ICD-10-CM | POA: Diagnosis not present

## 2023-08-15 DIAGNOSIS — M6281 Muscle weakness (generalized): Secondary | ICD-10-CM | POA: Diagnosis not present

## 2023-08-15 DIAGNOSIS — J9611 Chronic respiratory failure with hypoxia: Secondary | ICD-10-CM | POA: Diagnosis not present

## 2023-08-15 DIAGNOSIS — I110 Hypertensive heart disease with heart failure: Secondary | ICD-10-CM | POA: Diagnosis not present

## 2023-08-15 DIAGNOSIS — I2510 Atherosclerotic heart disease of native coronary artery without angina pectoris: Secondary | ICD-10-CM | POA: Diagnosis not present

## 2023-08-15 DIAGNOSIS — I69398 Other sequelae of cerebral infarction: Secondary | ICD-10-CM | POA: Diagnosis not present

## 2023-08-15 DIAGNOSIS — J9612 Chronic respiratory failure with hypercapnia: Secondary | ICD-10-CM | POA: Diagnosis not present

## 2023-08-15 DIAGNOSIS — I442 Atrioventricular block, complete: Secondary | ICD-10-CM | POA: Diagnosis not present

## 2023-08-15 DIAGNOSIS — L89312 Pressure ulcer of right buttock, stage 2: Secondary | ICD-10-CM | POA: Diagnosis not present

## 2023-08-15 NOTE — Patient Instructions (Signed)
 Visit Information  Thank you for taking time to visit with me today. Please don't hesitate to contact me if I can be of assistance to you before our next scheduled telephone appointment.  Our next appointment is by telephone on 08/22/23  at 2:30pm   Following is a copy of your care plan:   Goals Addressed             This Visit's Progress    TOC Care Plan       Current Barriers:  Medication management  Provider appointments - PCP 3/31 Cardiology 4/16  Will schedule Pulmonology  Home Health services- [facility]  and  SN  Equipment/DME trouble with BiPAP supplies reviewed possible portable O2 concentrator  Functional/Safety -sleeps in recliner   RNCM Clinical Goal(s):  Patient will work with the Care Management team over the next 30 days to address Transition of Care Barriers: Medication Management Support at home Provider appointments Home Health services Equipment/DME take all medications exactly as prescribed and will call provider for medication related questions as evidenced by no missed medication doses  attend all scheduled medical appointments:  as evidenced by no missed follow-up visits  demonstrate ongoing self health care management ability  as evidenced by decreased ER and hospital admissions through collaboration with RN Care manager, provider, and care team.   Interventions: Evaluation of current treatment plan related to  self management and patient's adherence to plan as established by provider  Transitions of Care:  Goal on track:  Yes. Sick Day Rules Reviewed Doctor Visits  - discussed the importance of doctor visits Contacted Health RN/OT/PT Gasper Karst [phone] Troubleshoot use of DME in the home( nebulizer, BiPAP, O2, Pressure relieving devices )  Post discharge activity limitations prescribed by provider reviewed Post-op wound/incision care reviewed with patient/caregiver Reviewed Signs and symptoms of infection  Heart Failure Interventions:  (Status:   Goal on track:  Yes.) Short Term Goal Provided education on low sodium diet Assessed need for readable accurate scales in home Provided education about placing scale on hard, flat surface Advised patient to weigh each morning after emptying bladder Reviewed role of diuretics in prevention of fluid overload and management of heart failure; Discussed the importance of keeping all appointments with provider Advised patient to discuss medication changes and DM supplies  with provider Assessed social determinant of health barriers   Patient Goals/Self-Care Activities: Participate in Transition of Care Program/Attend TOC scheduled calls Take all medications as prescribed Attend all scheduled provider appointments Call pharmacy for medication refills 3-7 days in advance of running out of medications Perform all self care activities independently  Perform IADL's (shopping, preparing meals, housekeeping, managing finances) independently Call provider office for new concerns or questions  call office if I gain more than 2 pounds in one day or 5 pounds in one week keep legs up while sitting use salt in moderation watch for swelling in feet, ankles and legs every day weigh myself daily bring diary to all appointments eat more whole grains, fruits and vegetables, lean meats and healthy fats know when to call the doctor  Follow Up Plan:  Telephone follow up appointment with care management team member scheduled for:  08/22/23 @ 2:30pm  The patient has been provided with contact information for the care management team and has been advised to call with any health related questions or concerns.         Medication review  Reviewed current home medications -- provided education as needed. Patient is aware of  potential side effects and was encouraged to notify PCP for any adverse side effects or unwanted symptoms not relieved with interventions    Reviewed goals for care Patient/ Caregiver  verbalizes understanding of instructions with the plan of care . The  Patient / Caregiver was encouraged to make informed decisions about care, actively participate in managing health conditions, and implement lifestyle changes as needed to promote independence and self-management of healthcare. SDOH screenings have been completed and addressed if indicted.  There are no reported barriers to care.    Follow-up Plan 1.If not already completed- Please call scheduled a post hospital Follow up appointment  with your  PCP within in 1-2 weeks of your hospital discharge date -when scheduling please ask your Primary MD to get all Hospital records sent to his/her office. Take all your medications and your discharge paperwork with you for your next visit with your Primary MD-   Be sure to request your Primary MD to go over all hospital tests and procedure/radiological results at the follow up appointment ,   [facility] Case Management Nurse will provide follow-up and on-going assessment ,evaluation and education of disease processes, recommended interventions for both chronic and acute medical conditions ,  along with ongoing review of symptoms ,medication reviews / reconciliation during each weekly call . Any updates , inconsistencies, discrepancies or acute care concerns will be addressed and routed to the correct Practitioner if indicated     [facility]  Please call the care guide team at [phone]  if you need to cancel or reschedule your appointment . For scheduled calls -Three attempts will be made to reach you -if the scheduled call is missed or  we are unable to reach the you after 3 attempts no additional outreach attempts will be made and the TOC follow-up will be closed .   If you need to speak to a Nurse you may  call me directly at the number below or if I am unavailable,and  your need is urgent  please call the main [facility] number at [phone] and ask to speak with one of the [facility] (  Transition of Care )  Nurses  .  Patient was encouraged to Contact PCP with any changes in baseline or  medication regimen,  changes in health status  /  well-being, safety concerns, including falls any questions or concerns regarding ongoing medical care, any difficulty obtaining or picking up prescriptions, any changes or worsening in condition- including  symptoms not relieved  with interventions                                                                            Additionally, If you experience worsening of your symptoms, develop shortness of breath, If you are experiencing a medical emergency,  develop suicidal or homicidal thoughts you must seek medical attention immediately by calling 911 or report to your local emergency department or urgent care.   If you have a non-emergency medical problem during routine business hours, please contact your provider's office and ask to speak with a nurse.       Please take the time to read instructions/literature along with the possible adverse reactions/side effects for all the Medicines  that have been prescribed to you. Only take newly prescribed  Medications after you have completely understood and accept all the possible adverse reactions/side effects.   Do not take more than prescribed Medications for  Pain, Sleep and Anxiety. Do not drive when taking Pain medications or sleep aid/ insomnia  medications It is not advisable to combine anxiety, sleep and pain medications without talking with your primary care practitioner    If you are experiencing a Mental Health or Behavioral Health Crisis or need someone to talk to Please call the Suicide and Crisis Lifeline: 58 You may also call the [place]  National Suicide Prevention Lifeline: 2792585189 or TTY: 857 051 4809 TTY (415)493-9276) to talk to a trained counselor.  You may call the Behavioral Health Crisis Line at 272-656-0620, at any time, 24 hours a day, 7 days a week- however If you are in  danger or need immediate medical attention, call 911.   If you would like help to quit smoking, call 1-800-QUIT-NOW ( 463-741-8414) OR Espaol: 1-855-Djelo-Ya (7-253-664-4034) o para ms informacin haga clic aqu or Text READY to 742-595 to register via text.   The patient has been provided with contact information for the care management team and has been advised to call with any health-related questions or concerns. Follow up call  with Care Team  as scheduled,or sooner should any new problems arise.  James Mcardle , BSN, RN [employer]   [facility]-Population Health RN Care Manager Direct Dial [phone]  Website: [email or web address]

## 2023-08-15 NOTE — Transitions of Care (Post Inpatient/ED Visit) (Signed)
 Transition of Care week 4  Visit Note  08/15/2023  Name: Dawn Gonzales MRN: [id number]          DOB: 12-31-1940  Situation: Patient enrolled in [facility] 30-day program. Visit completed with Patient and Daughter [name]  by telephone.   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   26 mm Edwards Sapien 3 transcatheter heart valve placed via percutaneous right transfemoral approach    Severe aortic stenosis    Sleep apnea     Assessment: Patient Reported Symptoms: Cognitive Cognitive Status: Alert and oriented to person, place, and time, Normal speech and language skills, Insightful and able to interpret abstract concepts   Health Maintenance Behaviors: Annual physical exam, Exercise, Healthy diet, Sleep adequate, Stress management Healing Pattern: Average Health Facilitated by: Healthy diet, Pain control, Rest, Stress management  Neurological      HEENT HEENT Symptoms Reported: No symptoms reported      Cardiovascular Cardiovascular Symptoms Reported: Irregular pulse Does patient have uncontrolled Hypertension?: No Cardiovascular Conditions: Hypertension, Heart failure Cardiovascular Management Strategies: Diet modification, Coping strategies, Adequate rest, Medication therapy, Exercise Do You Have a Working Readable Scale?: Yes (didficut to weight due to her needing assistance to stand) Cardiovascular Self-Management Outcome: 3 (uncertain)  Respiratory Respiratory Symptoms Reported: Shortness  of breath Other Respiratory Symptoms: On O2 had Nebs and Oxygen Respiratory Conditions: COPD, Shortness of breath Respiratory Management Strategies: adequate rest, breathing exercise, breathing techniques, diet modification, coping strategies, medication therapy, oxygen therapy Oxygen Therapy Device: nasal cannula Oxygen Therapy Times: continuous Oxygen Flow (L/min): 3-4 BiPAP Settings: preset Respiratory Self-Management Outcome: 4 (good) Respiratory Comment: able to get O2 sats to 100 % with deep breathing  Endocrine Patient reports the following symptoms related to hypoglycemia or hyperglycemia : Fast heartbeat Is patient diabetic?: No    Gastrointestinal Gastrointestinal Symptoms Reported: No symptoms reported      Genitourinary      Integumentary Additional Integumentary Details: PI Healed , using barrier cream Skin Conditions: Pressure injury (now healed) Skin Management Strategies: Adequate rest, Activity, Diet modification, Medication therapy Skin Self-Management Outcome: 4 (good) Skin Comment: Pressure relieving devices  and offloading  Musculoskeletal Musculoskelatal Symptoms Reviewed: Difficulty walking, Muscle pain, Weakness, Unsteady gait Musculoskeletal Conditions: Joint pain, Mobility limited, Unsteady gait Musculoskeletal Management Strategies: Adequate rest, Coping strategies, Exercise, Medication therapy, Medical device Musculoskeletal Self-Management Outcome: 4 (good) Falls in the past year?: No Number of falls in past year: 1 or less Patient at Risk for Falls Due to: Impaired balance/gait, Impaired mobility Fall risk Follow up: Falls prevention discussed  Psychosocial Psychosocial Symptoms Reported: No symptoms reported   Major Change/Loss/Stressor/Fears (CP): Medical condition, self Techniques to Cope with Loss/Stress/Change: Diversional activities, Exercise, Medication Quality of Family Relationships: helpful, involved, supportive   There were no vitals filed  for this visit.  Medications Reviewed Today     Reviewed by Grafton Lawrence, RN (Registered Nurse) on 08/15/23 at  1452  Med List Status: <None>   Medication Order Taking? Sig Documenting Provider Last Dose Status Informant  acetaminophen (TYLENOL) 325 MG tablet [id number] Yes Take 2 tablets (650 mg total) by mouth every 6 (six) hours as needed for mild pain, fever or headache (or Fever >/= 101). Shon Hale, MD Taking Active Child  apixaban (ELIQUIS) 5 MG TABS tablet [id number] Yes Take 1 tablet (5 mg total) by mouth 2 (two) times daily. Jonelle Sidle, MD Taking Active   Calcium Carbonate (CALCIUM-CARB 600 PO) [id number] Yes Take 600 mg by mouth daily. [provider] Taking Active Child  cholecalciferol (VITAMIN D) 1000 units tablet [id number] Yes Take 1,000 Units by mouth daily. [provider] Taking Active Child  dextromethorphan-guaiFENesin (MUCINEX DM) 30-600 MG 12hr tablet [id number] Yes Take 1 tablet by mouth 2 (two) times daily.  Patient taking differently: Take 1 tablet by mouth 2 (two) times daily as needed for cough.   Shon Hale, MD Taking Active Child  ferrous sulfate 325 (65 FE) MG tablet [id number] Yes Take 325 mg by mouth daily with breakfast. [provider] Taking Active Child  gabapentin (NEURONTIN) 300 MG capsule [id number] Yes Take 300 mg by mouth 2 (two) times daily. [provider] Taking Active Child           Med Note Sharlene Dory   Wed Apr 05, 2022 12:16 PM)    ipratropium-albuterol (DUONEB) 0.5-2.5 (3) MG/3ML SOLN [id number] Yes Take 3 mLs by nebulization 2 (two) times daily. Kendell Bane, MD Taking Active Child  metoprolol tartrate 37.5 MG TABS [id number] Yes Take 1 tablet (37.5 mg total) by mouth 2 (two) times daily. Catarina Hartshorn, MD Taking Active   Multiple Vitamin (MULTIVITAMIN WITH MINERALS) TABS tablet [id number] Yes Take 1 tablet by mouth daily. [provider] Taking Active Child  nystatin  (MYCOSTATIN/NYSTOP) powder [id number] Yes APPLY TO AFFECTED AREA TWICE A DAY [provider] Taking Active Child           Med Note Elesa Massed, ANGELICA G   Sat Jul 21, 2023  7:26 PM)    OXYGEN [id number] Yes Inhale 3 L into the lungs. With long tube mostly 1-1.5 [provider] Taking Active Child  potassium chloride (KLOR-CON) 10 MEQ tablet [id number] Yes Take 1 tablet (10 mEq total) by mouth daily. Take While taking Demadex/Torsemide Shon Hale, MD Taking Active Child  pravastatin (PRAVACHOL) 20 MG tablet [id number] Yes Take 20 mg by mouth daily. [provider] Taking Active Child  silver sulfADIAZINE (SILVADENE) 1 % cream [id number] No Apply 1 Application topically daily as needed (wound).  Patient not taking: Reported on 08/15/2023   [provider] Not Taking Active Child  torsemide (DEMADEX) 20 MG tablet [id number] Yes TAKE 2 TABLETS BY MOUTH 2 TIMES DAILY. Jonelle Sidle, MD Taking Active   Turmeric (QC TUMERIC COMPLEX) 500 MG CAPS [id number] Yes Take 1 capsule by mouth at bedtime. [provider] Taking Active Child  venlafaxine (EFFEXOR) 37.5 MG tablet [id number] Yes Take 37.5 mg by mouth daily. [provider] Taking Active Child           Med Note Sharlene Dory   Wed Apr 05, 2022 12:16 PM)    Vibegron (GEMTESA) 75 MG TABS [id number] Yes Take 1 tablet (75 mg total) by mouth daily. McKenzie, Mardene Celeste, MD Taking Active Child  vitamin C (ASCORBIC ACID) 500 MG tablet [id number] Yes Take 500 mg by mouth daily. [provider] Taking Active Child  Recommendation:   PCP Follow-up Specialty provider follow-up with Pulmonology  and Cardiology as indicated  Home Health requests: Physical therapy Skilled Nursing  Follow Up Plan:   Telephone follow up appointment date/time:  08/22/23 @ 2:30pm   James Mcardle , BSN, RN [facility]   [facility]BC[facility]-Population Health RN Care Manager Direct Dial [phone]  Fax:  [phone] Website: [email or web address]

## 2023-08-16 DIAGNOSIS — M6281 Muscle weakness (generalized): Secondary | ICD-10-CM | POA: Diagnosis not present

## 2023-08-16 DIAGNOSIS — J9612 Chronic respiratory failure with hypercapnia: Secondary | ICD-10-CM | POA: Diagnosis not present

## 2023-08-16 DIAGNOSIS — I110 Hypertensive heart disease with heart failure: Secondary | ICD-10-CM | POA: Diagnosis not present

## 2023-08-16 DIAGNOSIS — I442 Atrioventricular block, complete: Secondary | ICD-10-CM | POA: Diagnosis not present

## 2023-08-16 DIAGNOSIS — I5032 Chronic diastolic (congestive) heart failure: Secondary | ICD-10-CM | POA: Diagnosis not present

## 2023-08-16 DIAGNOSIS — G4733 Obstructive sleep apnea (adult) (pediatric): Secondary | ICD-10-CM | POA: Diagnosis not present

## 2023-08-16 DIAGNOSIS — I2510 Atherosclerotic heart disease of native coronary artery without angina pectoris: Secondary | ICD-10-CM | POA: Diagnosis not present

## 2023-08-16 DIAGNOSIS — G4730 Sleep apnea, unspecified: Secondary | ICD-10-CM | POA: Diagnosis not present

## 2023-08-16 DIAGNOSIS — J9611 Chronic respiratory failure with hypoxia: Secondary | ICD-10-CM | POA: Diagnosis not present

## 2023-08-16 DIAGNOSIS — I69398 Other sequelae of cerebral infarction: Secondary | ICD-10-CM | POA: Diagnosis not present

## 2023-08-16 DIAGNOSIS — L89312 Pressure ulcer of right buttock, stage 2: Secondary | ICD-10-CM | POA: Diagnosis not present

## 2023-08-22 ENCOUNTER — Other Ambulatory Visit: Payer: Self-pay

## 2023-08-22 VITALS — Wt 259.0 lb

## 2023-08-22 DIAGNOSIS — I69398 Other sequelae of cerebral infarction: Secondary | ICD-10-CM | POA: Diagnosis not present

## 2023-08-22 DIAGNOSIS — I5032 Chronic diastolic (congestive) heart failure: Secondary | ICD-10-CM

## 2023-08-22 DIAGNOSIS — I442 Atrioventricular block, complete: Secondary | ICD-10-CM | POA: Diagnosis not present

## 2023-08-22 DIAGNOSIS — M6281 Muscle weakness (generalized): Secondary | ICD-10-CM | POA: Diagnosis not present

## 2023-08-22 DIAGNOSIS — I2510 Atherosclerotic heart disease of native coronary artery without angina pectoris: Secondary | ICD-10-CM | POA: Diagnosis not present

## 2023-08-22 DIAGNOSIS — J9612 Chronic respiratory failure with hypercapnia: Secondary | ICD-10-CM | POA: Diagnosis not present

## 2023-08-22 DIAGNOSIS — L89312 Pressure ulcer of right buttock, stage 2: Secondary | ICD-10-CM | POA: Diagnosis not present

## 2023-08-22 DIAGNOSIS — I110 Hypertensive heart disease with heart failure: Secondary | ICD-10-CM | POA: Diagnosis not present

## 2023-08-22 DIAGNOSIS — J9611 Chronic respiratory failure with hypoxia: Secondary | ICD-10-CM | POA: Diagnosis not present

## 2023-08-22 NOTE — Patient Instructions (Signed)
 Visit Information  Thank you for taking time to visit with me today. Please don't hesitate to contact me if I can be of assistance to you before our next scheduled telephone appointment.   Following is a copy of your care plan:   Goals Addressed             This Visit's Progress    COMPLETED: TOC Care Plan       Current Barriers:   Home Health services  Functional/Safety -sleeps in recliner   RNCM Clinical Goal(s):  Patient will work with the Care Management team over the next 30 days to address Transition of Care Barriers: Medication Management Support at home Provider appointments Home Health services Equipment/DME take all medications exactly as prescribed and will call provider for medication related questions as evidenced by no missed medication doses  attend all scheduled medical appointments:  as evidenced by no missed follow-up visits  demonstrate ongoing self health care management ability  as evidenced by decreased ER and hospital admissions through collaboration with RN Care manager, provider, and care team.   Interventions: Evaluation of current treatment plan related to  self management and patient's adherence to plan as established by provider  Transitions of Care:  Goal Met. Sick Day Rules Reviewed Doctor Visits  - discussed the importance of doctor visits Post discharge activity limitations prescribed by provider reviewed  Heart Failure Interventions:  (Status:  Goal Met.) Short Term Goal Provided education on low sodium diet Assessed need for readable accurate scales in home Provided education about placing scale on hard, flat surface Advised patient to weigh each morning after emptying bladder Reviewed role of diuretics in prevention of fluid overload and management of heart failure; Discussed the importance of keeping all appointments with provider Assessed social determinant of health barriers   Patient Goals/Self-Care Activities: Participate in  Transition of Care Program/Attend TOC scheduled calls Take all medications as prescribed Attend all scheduled provider appointments Call pharmacy for medication refills 3-7 days in advance of running out of medications Perform all self care activities independently  Perform IADL's (shopping, preparing meals, housekeeping, managing finances) independently Call provider office for new concerns or questions  call office if I gain more than 2 pounds in one day or 5 pounds in one week keep legs up while sitting use salt in moderation watch for swelling in feet, ankles and legs every day weigh myself daily bring diary to all appointments eat more whole grains, fruits and vegetables, lean meats and healthy fats know when to call the doctor  Follow Up Plan:  No further follow up required: Patient referred to longitudinal nurse          Patient verbalizes understanding of instructions and care plan provided today and agrees to view in MyChart. Active MyChart status and patient understanding of how to access instructions and care plan via MyChart confirmed with patient.     No further follow up required: Patient referred over to longitudinal program  Please call the care guide team at [phone] if you need to cancel or reschedule your appointment.   Please call the Suicide and Crisis Lifeline: 988 if you are experiencing a Mental Health or Behavioral Health Crisis or need someone to talk to.  Cartier Washko J. Alithia Zavaleta RN, MSN [facility], [facility] RN Care Manager Direct Dial: [phone]  Fax: [phone] Website: [email or web address]

## 2023-08-22 NOTE — Transitions of Care (Post Inpatient/ED Visit) (Signed)
 Transition of Care  Week #5  Visit Note  08/22/2023  Name: Dawn Gonzales MRN: [id number]          DOB: Jul 29, 1940  Situation: Patient enrolled in [facility] 30-day program. Visit completed with patient and daughter Dawn Gonzales by telephone.   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   26 mm Edwards Sapien 3 transcatheter heart valve placed via percutaneous right transfemoral approach    Severe aortic stenosis    Sleep apnea     Assessment: Patient Reported Symptoms: Cognitive Cognitive Status: Alert and oriented to person, place, and time, Normal speech and language skills      Neurological Neurological Review of Symptoms: No symptoms reported    HEENT HEENT Symptoms Reported: No symptoms reported      Cardiovascular Cardiovascular Symptoms Reported: Irregular pulse Does patient have uncontrolled Hypertension?: No Cardiovascular Conditions: Heart failure, Hypertension Cardiovascular Management Strategies: Diet modification, Adequate rest, Medication therapy Do You Have a Working Readable Scale?: Yes Weight: 259 lb (117.5 kg) Cardiovascular Self-Management Outcome: 4 (good)  Respiratory Respiratory Symptoms Reported: Shortness of breath Other Respiratory Symptoms: Has nebs treatments and oxygen  Respiratory Conditions: COPD, Shortness of breath Respiratory Management Strategies: adequate rest, breathing exercise, oxygen  therapy, medication therapy Oxygen  Therapy Device: nasal cannula Oxygen   Therapy Times: continuous Oxygen  Flow (L/min): 3-4 liters BiPAP Settings: preset Respiratory Self-Management Outcome: 4 (good)  Endocrine Patient reports the following symptoms related to hypoglycemia or hyperglycemia : No symptoms reported    Gastrointestinal Gastrointestinal Symptoms Reported: No symptoms reported      Genitourinary Genitourinary Symptoms Reported: No symptoms reported    Integumentary Integumentary Symptoms Reported: No symptoms reported Additional Integumentary Details: Wound healed per daughter Dawn Gonzales Skin Self-Management Outcome: 4 (good)  Musculoskeletal Musculoskelatal Symptoms Reviewed: Difficulty walking, Unsteady gait, Weakness Musculoskeletal Management Strategies: Adequate rest, Medication therapy Musculoskeletal Self-Management Outcome: 4 (good) Falls in the past year?: No Patient at Risk for Falls Due to: Impaired balance/gait, Impaired mobility  Psychosocial Psychosocial Symptoms Reported: No symptoms reported         There were no vitals filed for this visit.  Medications Reviewed Today     Reviewed by Deran Barro, RN (Case Manager) on 08/22/23 at 1506  Med List Status: <None>   Medication Order Taking? Sig Documenting Provider Last Dose Status Informant  acetaminophen  (TYLENOL ) 325 MG tablet [id number] Yes Take 2 tablets (650 mg total) by mouth every 6 (six) hours as needed for mild pain, fever or headache (or Fever >/= 101). Colin Dawley, MD Taking Active Child  apixaban  (ELIQUIS ) 5 MG TABS tablet [id number] Yes Take 1 tablet (5 mg total) by mouth 2 (two) times daily. Gerard Knight, MD Taking Active   Calcium  Carbonate (CALCIUM -CARB 600 PO) [id number] Yes Take 600 mg by mouth daily. [provider] Taking Active Child  cholecalciferol  (VITAMIN D ) 1000 units tablet [id number] Yes Take 1,000 Units by mouth daily. [provider]  Taking Active Child  dextromethorphan -guaiFENesin  (MUCINEX  DM) 30-600 MG 12hr tablet [id number] Yes  Take 1 tablet by mouth 2 (two) times daily.  Patient taking differently: Take 1 tablet by mouth 2 (two) times daily as needed for cough.   Colin Dawley, MD Taking Active Child  ferrous sulfate  325 (65 FE) MG tablet [id number] Yes Take 325 mg by mouth daily with breakfast. [provider] Taking Active Child  gabapentin  (NEURONTIN ) 300 MG capsule [id number] Yes Take 300 mg by mouth 2 (two) times daily. [provider] Taking Active Child           Med Note Kemp Patter   Wed Apr 05, 2022 12:16 PM)    ipratropium-albuterol  (DUONEB) 0.5-2.5 (3) MG/3ML SOLN [id number] Yes Take 3 mLs by nebulization 2 (two) times daily. Bobbetta Burnet, MD Taking Active Child  metoprolol  tartrate 37.5 MG TABS [id number] Yes Take 1 tablet (37.5 mg total) by mouth 2 (two) times daily. Demaris Fillers, MD Taking Active   Multiple Vitamin (MULTIVITAMIN WITH MINERALS) TABS tablet [id number] Yes Take 1 tablet by mouth daily. [provider] Taking Active Child  nystatin  (MYCOSTATIN /NYSTOP ) powder [id number] Yes APPLY TO AFFECTED AREA TWICE A DAY [provider] Taking Active Child           Med Note Author Board, ANGELICA G   Sat Jul 21, 2023  7:26 PM)    OXYGEN  [id number] Yes Inhale 3 L into the lungs. With long tube mostly 1-1.5 [provider] Taking Active Child  potassium chloride  (KLOR-CON ) 10 MEQ tablet [id number] Yes Take 1 tablet (10 mEq total) by mouth daily. Take While taking Demadex /Torsemide  Colin Dawley, MD Taking Active Child  pravastatin  (PRAVACHOL ) 20 MG tablet [id number] Yes Take 20 mg by mouth daily. [provider] Taking Active Child  silver sulfADIAZINE (SILVADENE) 1 % cream [id number] No Apply 1 Application topically daily as needed (wound).  Patient not taking: Reported on 08/15/2023   [provider] Not Taking Active Child  torsemide  (DEMADEX ) 20 MG tablet [id number] Yes TAKE 2 TABLETS BY MOUTH 2 TIMES DAILY. Gerard Knight, MD Taking Active    Turmeric (QC TUMERIC COMPLEX) 500 MG CAPS [id number] Yes Take 1 capsule by mouth at bedtime. [provider] Taking Active Child  venlafaxine  (EFFEXOR ) 37.5 MG tablet [id number] Yes Take 37.5 mg by mouth daily. [provider] Taking Active Child           Med Note Kemp Patter   Wed Apr 05, 2022 12:16 PM)    Vibegron  (GEMTESA ) 75 MG TABS [id number] Yes Take 1 tablet (75 mg total) by mouth daily. McKenzie, Arden Beck, MD Taking Active Child  vitamin C  (ASCORBIC ACID ) 500 MG tablet [id number] Yes Take 500 mg by mouth daily. [provider] Taking Active Child           Per daughter Dawn Gonzales patient is doing well. No issues with swelling or increased shortness of breath.  Wound to buttock is healed.  Discussed and encouraged enrollment in longitudinal program for health management.  Patient agreeable.    Recommendation:   Referral to: Longitudinal nurse for complex care management  Follow Up Plan:   Referral to RN Case Manager for complex care  Baani Bober J. Khole Arterburn RN, MSN [facility]  [facility], [facility] RN Care Manager Direct Dial: [phone]  Fax: [phone] Website: [email or web address]

## 2023-08-27 NOTE — Addendum Note (Signed)
 Addended by: Edra Govern D on: 08/27/2023 05:31 PM   Modules accepted: Orders

## 2023-08-27 NOTE — Progress Notes (Signed)
 Remote pacemaker transmission.

## 2023-08-28 ENCOUNTER — Ambulatory Visit: Attending: Internal Medicine | Admitting: Internal Medicine

## 2023-08-28 ENCOUNTER — Encounter: Payer: Self-pay | Admitting: Internal Medicine

## 2023-08-28 VITALS — BP 112/48 | HR 61 | Ht 66.0 in | Wt 259.0 lb

## 2023-08-28 DIAGNOSIS — I350 Nonrheumatic aortic (valve) stenosis: Secondary | ICD-10-CM

## 2023-08-28 NOTE — Patient Instructions (Addendum)
 Medication Instructions:  Your physician recommends that you continue on your current medications as directed. Please refer to the Current Medication list given to you today.  *If you need a refill on your cardiac medications before your next appointment, please call your pharmacy*  Lab Work: None ordered.  You may go to any [facility] Location for your lab work:  [facility] - [address] ([facility]) - [address] [phone] [address]  [place] - [address]   [place]  - [address]   [place] - [address] - [address] [employer]  - [address] [place] [address] ([facility]   If you have labs (blood work) drawn today and your tests are completely normal, you will receive your results only by: [employer] (if you have MyChart)  If you have any lab test that is abnormal or we need to change your treatment, we will call you or send a MyChart message to review the results.  Testing/Procedures: None ordered.  Follow-Up: At [facility], you and your health needs are our priority.  As part of our continuing mission to provide you with exceptional heart care, we have created designated Provider Care Teams.  These Care Teams include your primary Cardiologist (physician) and Advanced Practice Providers (APPs -  Physician Assistants and Nurse Practitioners) who all work together to provide you with the care you need, when you need it.  Your next appointment:   1 year(s)  The format for your next appointment:   In Person  Provider:   Mertha Abrahams, PA-C  Note: Remote monitoring is used to monitor your Pacemaker/ ICD from home. This monitoring reduces the number of office visits required to check your device to one time per year. It allows us  to keep an eye on the functioning of your device to ensure it is working properly.

## 2023-08-28 NOTE — Progress Notes (Signed)
 HPI Dawn Gonzales returns today for followup. She is a pleasant obese woman with a h/o heart block s/p Biotronik DDD PM. She was hospitalized with atrial tachycardia in the setting of infection. She improved after her infection improved. She feels well now and has returned to her baseline.   No Known Allergies   Current Outpatient Medications  Medication Sig Dispense Refill   acetaminophen  (TYLENOL ) 325 MG tablet Take 2 tablets (650 mg total) by mouth every 6 (six) hours as needed for mild pain, fever or headache (or Fever >/= 101). 12 tablet 0   apixaban  (ELIQUIS ) 5 MG TABS tablet Take 1 tablet (5 mg total) by mouth 2 (two) times daily. 60 tablet 6   Calcium  Carbonate (CALCIUM -CARB 600 PO) Take 600 mg by mouth daily.     cholecalciferol  (VITAMIN D ) 1000 units tablet Take 1,000 Units by mouth daily.     dextromethorphan -guaiFENesin  (MUCINEX  DM) 30-600 MG 12hr tablet Take 1 tablet by mouth 2 (two) times daily. (Patient taking differently: Take 1 tablet by mouth 2 (two) times daily as needed for cough.) 20 tablet 0   ferrous sulfate  325 (65 FE) MG tablet Take 325 mg by mouth daily with breakfast.     gabapentin  (NEURONTIN ) 300 MG capsule Take 300 mg by mouth 2 (two) times daily.     ipratropium-albuterol  (DUONEB) 0.5-2.5 (3) MG/3ML SOLN Take 3 mLs by nebulization 2 (two) times daily. 360 mL 0   metoprolol  tartrate 37.5 MG TABS Take 1 tablet (37.5 mg total) by mouth 2 (two) times daily. 60 tablet 1   Multiple Vitamin (MULTIVITAMIN WITH MINERALS) TABS tablet Take 1 tablet by mouth daily.     nystatin  (MYCOSTATIN /NYSTOP ) powder APPLY TO AFFECTED AREA TWICE A DAY     OXYGEN  Inhale 3 L into the lungs. With long tube mostly 1-1.5     potassium chloride  (KLOR-CON ) 10 MEQ tablet Take 1 tablet (10 mEq total) by mouth daily. Take While taking Demadex /Torsemide  30 tablet 2   pravastatin  (PRAVACHOL ) 20 MG tablet Take 20 mg by mouth daily.     silver sulfADIAZINE (SILVADENE) 1 % cream Apply 1  Application topically daily as needed (wound).     torsemide  (DEMADEX ) 20 MG tablet TAKE 2 TABLETS BY MOUTH 2 TIMES DAILY. 120 tablet 0   Turmeric (QC TUMERIC COMPLEX) 500 MG CAPS Take 1 capsule by mouth at bedtime.     venlafaxine  (EFFEXOR ) 37.5 MG tablet Take 37.5 mg by mouth daily.     Vibegron  (GEMTESA ) 75 MG TABS Take 1 tablet (75 mg total) by mouth daily. 30 tablet 11   vitamin C  (ASCORBIC ACID ) 500 MG tablet Take 500 mg by mouth daily.     No current facility-administered medications for this visit.     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   Past Surgical History:  Procedure Laterality Date   Abdominal gangrene     ADENOIDECTOMY     Cataract surgery     COLONOSCOPY     ENDOSCOPIC RETROGRADE CHOLANGIOPANCREATOGRAPHY (ERCP) WITH PROPOFOL  N/A 02/08/2018   Procedure: ENDOSCOPIC RETROGRADE CHOLANGIOPANCREATOGRAPHY (ERCP) WITH PROPOFOL ;  Surgeon: Brice Campi Albino Alu., MD;  Location: [facility] ENDOSCOPY;  Service: Gastroenterology;  Laterality: N/A;   EUS  02/08/2018   Procedure: UPPER ENDOSCOPIC ULTRASOUND (EUS) LINEAR;  Surgeon: Brice Campi Albino Alu., MD;  Location: [facility] ENDOSCOPY;  Service: Gastroenterology;;   EYE SURGERY     IR FLUORO RM 30-60 MIN  03/27/2018   IR PERC CHOLECYSTOSTOMY  02/09/2018   IR RADIOLOGIST EVAL & MGMT  03/26/2018   IR RADIOLOGY  PERIPHERAL GUIDED IV START  05/22/2018   IR RADIOLOGY PERIPHERAL GUIDED IV START  05/30/2018   IR RADIOLOGY PERIPHERAL GUIDED IV START  06/05/2018   IR US  GUIDE VASC ACCESS RIGHT  05/22/2018   IR US  GUIDE VASC ACCESS RIGHT  05/30/2018   IR US  GUIDE VASC ACCESS RIGHT  06/05/2018   PACEMAKER IMPLANT N/A 07/10/2018   Procedure: PACEMAKER IMPLANT;  Surgeon: Tammie Fall, MD;  Location: [facility] INVASIVE CV LAB;  Service: Cardiovascular;  Laterality: N/A;   REMOVAL OF STONES  02/08/2018   Procedure: REMOVAL OF STONES;  Surgeon: Brice Campi Albino Alu., MD;  Location[facility] ENDOSCOPY;  Service: Gastroenterology;;   [address]  02/08/2018   Procedure: [address];  Surgeon: Mansouraty, Albino Alu., MD;  Location: [facility] ENDOSCOPY;  Service: Gastroenterology;;   TONSILLECTOMY     TOOTH EXTRACTION N/A 06/18/2018   Procedure: DENTAL RESTORATION/EXTRACTIONS;  Surgeon: Ascencion Lava, DDS;  Location: [facility] OR;  Service: Oral Surgery;  Laterality: N/A;   TRANSCATHETER AORTIC VALVE REPLACEMENT, TRANSFEMORAL N/A 07/09/2018   Procedure: TRANSCATHETER AORTIC VALVE REPLACEMENT, TRANSFEMORAL;  Surgeon: Arnoldo Lapping, MD;  Location: [facility] INVASIVE CV LAB;  Service: Cardiovascular;  Laterality: N/A;   Tummy tuck       Family History  Problem Relation Age of Onset   Cancer Mother    Hypertension Father    Arthritis Father    Heart attack Father      Social History   Socioeconomic History   Marital status: Married    Spouse name: Not on file   Number of children: 3   Years of education: Not on file   Highest education level: Not on file  Occupational History   Occupation: Retired houswife  Tobacco Use   Smoking status: Former    Current packs/day: 0.00    Types: Cigarettes    Quit date: 05/23/1978    Years since quitting: 45.2    Passive exposure: Never   Smokeless tobacco: Never  Vaping Use   Vaping status: Never Used  Substance and Sexual Activity   Alcohol use: Not Currently    Comment: Occasional    Drug use: Never   Sexual activity: Not on file  Other Topics Concern   Not on file  Social History Narrative   Not on file   Social Drivers of Health   Financial Resource Strain: Not on file  Food Insecurity: No Food Insecurity (07/25/2023)   Hunger Vital Sign    Worried About Running Out of Food in the Last Year: Never true    Ran Out of Food in the Last Year: Never true  Transportation Needs: No Transportation Needs (07/25/2023)   PRAPARE - Transportation  Lack of Transportation (Medical): No    Lack of Transportation (Non-Medical): No  Physical Activity: Not on file  Stress: Not on file  Social Connections: Socially Isolated (07/21/2023)   Social Connection and Isolation Panel [NHANES]    Frequency of Communication with Friends and Family: More than three times a week    Frequency of Social Gatherings with Friends and Family: More than three times a week    Attends Religious Services: Never    Database administrator or Organizations: No    Attends Banker Meetings: Never    Marital Status: Widowed  Intimate Partner Violence: Not At Risk (07/25/2023)   Humiliation, Afraid, Rape, and Kick questionnaire    Fear of Current or Ex-Partner: No    Emotionally Abused: No    Physically Abused: No    Sexually Abused: No     BP (!) 112/48   Pulse 61   Ht 5\' 6"  (1.676 m)   Wt 259 lb (117.5 kg)   SpO2 97%   BMI 41.80 kg/m   Physical Exam:  Chronically ill appearing obese woman, NAD HEENT: Unremarkable Neck:  No JVD, no thyromegally Lymphatics:  No adenopathy Back:  No CVA tenderness Lungs:  Clear with no wheezes. RR rhythm, no murmurs, no rubs, no clicks Abd:  soft, positive bowel sounds, no organomegally, no rebound, no guarding Ext:  2 plus pulses, no edema, no cyanosis, no clubbing Skin:  No rashes no nodules Neuro:  CN II through XII intact, motor grossly intact  EKG - nsr with RBBB  DEVICE  Normal device function.  See PaceArt for details.    Assess/Plan: Atrial tachy - this has resolved with resolution of her cellulitis. Continue current meds. Heart block - she is doing well s/p PPM insertion.  PPM -her biotronik DDD PM is working normally.  Obesity - she is encouraged to lose weight.  Pete Brand Luke Rigsbee,MD

## 2023-08-29 ENCOUNTER — Telehealth: Payer: Self-pay | Admitting: *Deleted

## 2023-08-29 DIAGNOSIS — M6281 Muscle weakness (generalized): Secondary | ICD-10-CM | POA: Diagnosis not present

## 2023-08-29 DIAGNOSIS — I69398 Other sequelae of cerebral infarction: Secondary | ICD-10-CM | POA: Diagnosis not present

## 2023-08-29 DIAGNOSIS — J9612 Chronic respiratory failure with hypercapnia: Secondary | ICD-10-CM | POA: Diagnosis not present

## 2023-08-29 DIAGNOSIS — I5032 Chronic diastolic (congestive) heart failure: Secondary | ICD-10-CM | POA: Diagnosis not present

## 2023-08-29 DIAGNOSIS — I110 Hypertensive heart disease with heart failure: Secondary | ICD-10-CM | POA: Diagnosis not present

## 2023-08-29 DIAGNOSIS — I2510 Atherosclerotic heart disease of native coronary artery without angina pectoris: Secondary | ICD-10-CM | POA: Diagnosis not present

## 2023-08-29 DIAGNOSIS — I442 Atrioventricular block, complete: Secondary | ICD-10-CM | POA: Diagnosis not present

## 2023-08-29 DIAGNOSIS — L89312 Pressure ulcer of right buttock, stage 2: Secondary | ICD-10-CM | POA: Diagnosis not present

## 2023-08-29 DIAGNOSIS — J9611 Chronic respiratory failure with hypoxia: Secondary | ICD-10-CM | POA: Diagnosis not present

## 2023-08-29 NOTE — Progress Notes (Signed)
 Complex Care Management Note  Care Guide Note 08/29/2023 Name: Dawn Gonzales MRN: [id number] DOB: 08/11/1940  [name] is a 83 y.o. year old female who sees Del Favia, Lethia Raveling, MD for primary care. I reached out to Donnell Gails by phone today to offer complex care management services.  Ms. Routt was given information about Complex Care Management services today including:   The Complex Care Management services include support from the care team which includes your Nurse Care Manager, Clinical Social Worker, or Pharmacist.  The Complex Care Management team is here to help remove barriers to the health concerns and goals most important to you. Complex Care Management services are voluntary, and the patient may decline or stop services at any time by request to their care team member.   Complex Care Management Consent Status: Patient agreed to services and verbal consent obtained.   Follow up plan:  Telephone appointment with complex care management team member scheduled for:  09/03/2023  Encounter Outcome:  Patient Scheduled  Kandis Ormond, CMA   [facility], [facility] Guide Direct Dial: [phone]  Fax: [phone] Website: [email or web address]

## 2023-09-03 ENCOUNTER — Other Ambulatory Visit: Payer: Self-pay | Admitting: Cardiology

## 2023-09-03 ENCOUNTER — Other Ambulatory Visit: Payer: Self-pay | Admitting: *Deleted

## 2023-09-03 ENCOUNTER — Other Ambulatory Visit: Payer: Self-pay

## 2023-09-03 DIAGNOSIS — G4733 Obstructive sleep apnea (adult) (pediatric): Secondary | ICD-10-CM | POA: Diagnosis not present

## 2023-09-03 DIAGNOSIS — E8881 Metabolic syndrome: Secondary | ICD-10-CM | POA: Insufficient documentation

## 2023-09-03 DIAGNOSIS — F419 Anxiety disorder, unspecified: Secondary | ICD-10-CM | POA: Insufficient documentation

## 2023-09-03 DIAGNOSIS — I359 Nonrheumatic aortic valve disorder, unspecified: Secondary | ICD-10-CM | POA: Insufficient documentation

## 2023-09-03 DIAGNOSIS — G44009 Cluster headache syndrome, unspecified, not intractable: Secondary | ICD-10-CM | POA: Diagnosis not present

## 2023-09-03 NOTE — Patient Instructions (Addendum)
 Visit Information  Thank you for taking time to visit with me today. Please don't hesitate to contact me if I can be of assistance to you before our next scheduled appointment.  Our next appointment is by telephone on 09/17/23 at 1 pm Please call the care guide team at [phone] if you need to cancel or reschedule your appointment.   Following is a copy of your care plan:   Goals Addressed             This Visit's Progress    VBCI RN Care Plan-home management of atrial fibrillation, CHF, CKD & pulmonary disease   On track    Problems:  Chronic Disease Management support and education needs related to Anxiety, Atrial Fibrillation, CHF, CKD Stage 3 a , Depression, HTN, Osteoarthritis, Overactive Bladder, and Pulmonary Disease  Goal: Over the next 60 days the Caregiver Patient will verbalize basic understanding of Anxiety, Atrial Fibrillation, CHF, CKD Stage 3 a, Depression, HTN, Osteoarthritis, Overactive Bladder, and Pulmonary Disease disease process and self health management plan as evidenced by chart documentation, verbalization with use of teach back method Over the next 30 days the caregiver & patient will be able to manage infection that leads to her tachycardia & fatigue as evidence of minimal or no infection per chart records  Interventions:   AFIB Interventions:   Counseled on importance of regular laboratory monitoring as prescribed Screening for signs and symptoms of depression related to chronic disease state  Assessed social determinant of health barriers   Anemia/Bleeding Interventions:  Assessment of understanding of anemia/bleeding disorder diagnosis  Basic overview and discussion of anemia/bleeding disorder or acute disease state  Medications reviewed  recommended promotion of rest and energy-conserving measures to manage fatigue, such as balancing activity with periods of rest Lab Results  Component Value Date   WBC 7.0 07/23/2023   HGB 11.2 (L) 07/23/2023    HCT 36.4 07/23/2023   MCV 95.5 07/23/2023   PLT 258 07/23/2023     Heart Failure Interventions: Basic overview and discussion of pathophysiology of Heart Failure reviewed Reviewed role of diuretics in prevention of fluid overload and management of heart failure; Discussed the importance of keeping all appointments with provider Provided patient with education about the role of exercise in the management of heart failure   Chronic Kidney Disease Interventions: Reviewed medications with patient and discussed importance of compliance    Advised patient, providing education and rationale, to monitor blood pressure daily and record, calling PCP for findings outside established parameters    Discussed plans with patient for ongoing care management follow up and provided patient with direct contact information for care management team    Last practice recorded BP readings:  BP Readings from Last 3 Encounters:  08/28/23 (!) 112/48  07/24/23 112/64  06/20/23 (!) 153/64   Most recent eGFR/CrCl: No results found for: "EGFR"  No components found for: "CRCL"    Hypertension Interventions: Last practice recorded BP readings:  BP Readings from Last 3 Encounters:  08/28/23 (!) 112/48  07/24/23 112/64  06/20/23 (!) 153/64   Most recent eGFR/CrCl: No results found for: "EGFR"  No components found for: "CRCL"  Evaluation of current treatment plan related to hypertension self management and patient's adherence to plan as established by provider Discussed the importance of hypotension as well as hypertension. Discussed potential for falls, dizziness, for hypotension. Encouraged to speak with pcp/cardiologist about the parameters for hypotension that patient would follow to call MD   Patient Self-Care Activities:  Attend all scheduled provider appointments Call pharmacy for medication refills 3-7 days in advance of running out of medications Call provider office for new concerns or questions   Take medications as prescribed   call office if I gain more than 2 pounds in one day or 5 pounds in one week watch for swelling in feet, ankles and legs every day weigh myself daily keep all lab appointments take medicine as prescribed call doctor for signs and symptoms of high blood pressure report new symptoms to your doctor Call doctor for low blood pressure values  Plan:  Telephone follow up appointment with care management team member scheduled for:  09/17/23 1 pm The patient has been provided with contact information for the care management team and has been advised to call with any health related questions or concerns.              Please call the Suicide and Crisis Lifeline: 988 call the [place]  National Suicide Prevention Lifeline: 680-602-3381 or TTY: (646)120-2766 TTY 581-065-6990) to talk to a trained counselor call 1-800-273-TALK (toll free, 24 hour hotline) call the [facility]: (661)214-0248 call 911 if you are experiencing a Mental Health or Behavioral Health Crisis or need someone to talk to.  Patient verbalizes understanding of instructions and care plan provided today and agrees to view in MyChart. Active MyChart status and patient understanding of how to access instructions and care plan via MyChart confirmed with patient.      Shaneal Barasch L. Mcarthur Speedy, RN, BSN, CCM [place]  [facility], [facility] RN Care Manager Direct Dial: [phone]  Fax: [phone]

## 2023-09-05 DIAGNOSIS — M6281 Muscle weakness (generalized): Secondary | ICD-10-CM | POA: Diagnosis not present

## 2023-09-05 DIAGNOSIS — L89312 Pressure ulcer of right buttock, stage 2: Secondary | ICD-10-CM | POA: Diagnosis not present

## 2023-09-05 DIAGNOSIS — J9612 Chronic respiratory failure with hypercapnia: Secondary | ICD-10-CM | POA: Diagnosis not present

## 2023-09-05 DIAGNOSIS — I110 Hypertensive heart disease with heart failure: Secondary | ICD-10-CM | POA: Diagnosis not present

## 2023-09-05 DIAGNOSIS — I2510 Atherosclerotic heart disease of native coronary artery without angina pectoris: Secondary | ICD-10-CM | POA: Diagnosis not present

## 2023-09-05 DIAGNOSIS — J9611 Chronic respiratory failure with hypoxia: Secondary | ICD-10-CM | POA: Diagnosis not present

## 2023-09-05 DIAGNOSIS — I5032 Chronic diastolic (congestive) heart failure: Secondary | ICD-10-CM | POA: Diagnosis not present

## 2023-09-05 DIAGNOSIS — I69398 Other sequelae of cerebral infarction: Secondary | ICD-10-CM | POA: Diagnosis not present

## 2023-09-05 DIAGNOSIS — I442 Atrioventricular block, complete: Secondary | ICD-10-CM | POA: Diagnosis not present

## 2023-09-07 LAB — CUP PACEART INCLINIC DEVICE CHECK
Battery Remaining Percentage: 65 %
Brady Statistic RA Percent Paced: 14 %
Brady Statistic RV Percent Paced: 0 %
Date Time Interrogation Session: 20250429111810
Implantable Lead Connection Status: 753985
Implantable Lead Connection Status: 753985
Implantable Lead Implant Date: 20200311
Implantable Lead Implant Date: 20200311
Implantable Lead Location: 753859
Implantable Lead Location: 753860
Implantable Lead Model: 377
Implantable Lead Model: 377
Implantable Lead Serial Number: 81024123
Implantable Lead Serial Number: 81075341
Implantable Pulse Generator Implant Date: 20200311
Lead Channel Impedance Value: 429 Ohm
Lead Channel Impedance Value: 526 Ohm
Lead Channel Pacing Threshold Amplitude: 0.7 V
Lead Channel Pacing Threshold Amplitude: 0.8 V
Lead Channel Pacing Threshold Pulse Width: 0.4 ms
Lead Channel Pacing Threshold Pulse Width: 0.4 ms
Lead Channel Setting Pacing Amplitude: 1.7 V
Lead Channel Setting Pacing Amplitude: 1.8 V
Lead Channel Setting Pacing Pulse Width: 0.4 ms
Pulse Gen Model: 407145
Pulse Gen Serial Number: 69550148

## 2023-09-14 NOTE — Patient Outreach (Signed)
 Complex Care Management   Visit Note  09/14/2023 Late entry for 09/03/23  Name:  Dawn Gonzales MRN: [id number] DOB: Jul 28, 1940  Situation: Referral received for Complex Care Management related to Heart Failure I obtained verbal consent from Patient.  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   26 mm Edwards Sapien 3 transcatheter heart valve placed via percutaneous right transfemoral approach    Severe aortic stenosis    Sleep apnea     Assessment: Patient Reported Symptoms:  Cognitive Cognitive Status: Alert and oriented to person, place, and time, Normal speech and language skills Cognitive/Intellectual Conditions Management [RPT]: None reported or documented in medical history or problem list   Health Maintenance Behaviors: Annual physical exam, Exercise, Healthy diet, Sleep adequate, Social activities, Stress management Healing Pattern: Average Health Facilitated by: Healthy diet, Pain control, Prayer/meditation, Rest, Stress management  Neurological Neurological Review of Symptoms: No symptoms reported Neurological Management Strategies: Medication therapy, Routine screening, Coping strategies Neurological Self-Management Outcome: 3 (uncertain) Neurological Comment: neuropathy  HEENT HEENT Symptoms Reported: No symptoms reported HEENT Management Strategies: Routine screening HEENT Self-Management Outcome: 4 (good)    Cardiovascular Cardiovascular Symptoms Reported: No symptoms reported Does  patient have uncontrolled Hypertension?: No Cardiovascular Conditions: Coronary artery disease, Dysrhythmia, Heart failure, Hypertension, High blood cholesterol Cardiovascular Management Strategies: Adequate rest, Diet modification, Fluid modification, Medication therapy, Routine screening Do You Have a Working Readable Scale?: Yes Cardiovascular Self-Management Outcome: 4 (good)  Respiratory Respiratory Symptoms Reported: Shortness of breath, Productive cough Respiratory Management Strategies: adequate rest, breathing exercise, oxygen  therapy, medication therapy, routine screening Oxygen  Therapy Device: nasal cannula Oxygen  Therapy Times: continuous Oxygen  Flow (L/min): 3-4 liters BiPAP Settings: preset Respiratory Self-Management Outcome: 4 (good)  Endocrine Patient reports the following symptoms related to hypoglycemia or hyperglycemia : Increased thirst Is patient diabetic?: No Endocrine Conditions: Vitamin D  deficiency Endocrine Management Strategies: Medication therapy, Routine screening Endocrine Self-Management Outcome: 3 (uncertain)  Gastrointestinal Gastrointestinal Symptoms Reported: Obesity Gastrointestinal Conditions: Other Other Gastrointestinal Conditions: history of gall stones SBO Gastrointestinal Management Strategies: Adequate rest, Diet modification, Fluid modification Gastrointestinal Self-Management Outcome: 4 (good) Nutrition Risk Screen (CP): No indicators present  Genitourinary Genitourinary Symptoms Reported: Incontinence, Frequency Genitourinary Conditions: Chronic kidney disease, Frequency, Incontinence, Other Other Genitourinary Conditions: lesionon right kidney Genitourinary Management Strategies: Incontinence garment/pad, Adequate rest Genitourinary Self-Management Outcome: 3 (uncertain)  Integumentary Integumentary Symptoms Reported: Wound Additional Integumentary Details: pressure injury,sacral region of back Skin Conditions: Pressure injury, Wound Skin  Management Strategies: Activity, Adequate rest, Diet modification, Medication therapy, Routine screening Skin Self-Management Outcome: 4 (good) Skin Comment: history of skin lesions, cellulitis, pressure injury of buttocks, candidiasis of skin deep tissue injury history  Musculoskeletal Musculoskelatal Symptoms Reviewed: Difficulty walking, Unsteady gait, Weakness, Muscle pain Musculoskeletal Conditions: Joint pain, Mobility limited, Unsteady gait, Osteoarthritis Musculoskeletal Management Strategies: Adequate rest, Medication therapy, Routine screening, Medical device Musculoskeletal Self-Management Outcome: 3 (uncertain) Falls in the past year?: No Number of  falls in past year: 1 or less Was there an injury with Fall?: No Fall Risk Category Calculator: 0 Patient Fall Risk Level: Low Fall Risk Patient at Risk for Falls Due to: Impaired balance/gait, Impaired mobility Fall risk Follow up: Falls evaluation completed  Psychosocial Psychosocial Symptoms Reported: No symptoms reported Behavioral Health Conditions: Depression, Anxiety Behavioral Management Strategies: Adequate rest, Support system, Coping strategies Behavioral Health Self-Management Outcome: 4 (good) Major Change/Loss/Stressor/Fears (CP): Medical condition, self Techniques to Cope with Loss/Stress/Change: Diversional activities Quality of Family Relationships: helpful, involved, supportive Do you feel physically threatened by others?: No      09/03/2023   12:18 PM  Depression screen PHQ 2/9  Decreased Interest 0  Down, Depressed, Hopeless 0  PHQ - 2 Score 0    Vitals:    Medications Reviewed Today     Reviewed by Arlyce Berger, RN (Registered Nurse) on 09/03/23 at 1217  Med List Status: <None>   Medication Order Taking? Sig Documenting Provider Last Dose Status Informant  acetaminophen  (TYLENOL ) 325 MG tablet [id number] Yes Take 2 tablets (650 mg total) by mouth every 6 (six) hours as needed for mild pain, fever  or headache (or Fever >/= 101). Colin Dawley, MD Taking Active Child  apixaban  (ELIQUIS ) 5 MG TABS tablet [id number]  Take 1 tablet (5 mg total) by mouth 2 (two) times daily. Gerard Knight, MD  Active   Calcium  Carbonate (CALCIUM -CARB 600 PO) [id number] Yes Take 600 mg by mouth daily. [provider] Taking Active Child  cholecalciferol  (VITAMIN D ) 1000 units tablet [id number]  Take 1,000 Units by mouth daily. [provider]  Active Child  dextromethorphan -guaiFENesin  (MUCINEX  DM) 30-600 MG 12hr tablet [id number]  Take 1 tablet by mouth 2 (two) times daily.  Patient taking differently: Take 1 tablet by mouth 2 (two) times daily as needed for cough.   Colin Dawley, MD  Active Child  ferrous sulfate  325 (65 FE) MG tablet [id number]  Take 325 mg by mouth daily with breakfast. [provider]  Active Child  gabapentin  (NEURONTIN ) 300 MG capsule [id number]  Take 300 mg by mouth 2 (two) times daily. [provider]  Active Child           Med Note Kemp Patter   Wed Apr 05, 2022 12:16 PM)    ipratropium-albuterol  (DUONEB) 0.5-2.5 (3) MG/3ML SOLN [id number]  Take 3 mLs by nebulization 2 (two) times daily. Bobbetta Burnet, MD  Active Child  metoprolol  tartrate 37.5 MG TABS [id number]  Take 1 tablet (37.5 mg total) by mouth 2 (two) times daily. Demaris Fillers, MD  Active   Multiple Vitamin (MULTIVITAMIN WITH MINERALS) TABS tablet [id number]  Take 1 tablet by mouth daily. [provider]  Active Child  nystatin  (MYCOSTATIN /NYSTOP ) powder [id number]  APPLY TO AFFECTED AREA TWICE A DAY [provider]  Active Child           Med Note Author Board, ANGELICA G   Sat Jul 21, 2023  7:26 PM)    OXYGEN  [id number] Yes Inhale 3 L into the lungs. With long tube mostly 1-1.5 [provider] Taking Active Child  potassium chloride  (KLOR-CON ) 10 MEQ tablet [id number]  Take 1 tablet (10 mEq total) by mouth daily. Take While taking Demadex /Torsemide  Colin Dawley, MD  Active Child  pravastatin  (PRAVACHOL ) 20 MG tablet [id number]  Take 20 mg by mouth daily. [provider]  Active Child  silver sulfADIAZINE (SILVADENE) 1 % cream [id number]  Apply 1 Application topically daily as  needed (wound). [provider]  Active Child  torsemide  (DEMADEX ) 20 MG tablet [id number]  TAKE 2 TABLETS BY MOUTH TWICE A DAY Gerard Knight, MD  Active   Turmeric (QC TUMERIC COMPLEX) 500 MG CAPS [id number]  Take 1 capsule by mouth at bedtime. [provider]  Active Child  venlafaxine  (EFFEXOR ) 37.5 MG tablet [id number]  Take 37.5 mg by mouth daily. [provider]  Active Child           Med Note Kemp Patter   Wed Apr 05, 2022 12:16 PM)    Vibegron  (GEMTESA ) 75 MG TABS [id number]  Take 1 tablet (75 mg total) by mouth daily. McKenzie, Arden Beck, MD  Active Child  vitamin C  (ASCORBIC ACID ) 500 MG tablet [id number]  Take 500 mg by mouth daily. [provider]  Active Child            Recommendation:   10/09/23 PCP Follow-up Specialty provider follow-up Speak with cardiology/pcp about the parameters/action plan for hypotension  Please feel free to outreach to [facility] RN CCM as needed between scheduled visits  Follow Up Plan:   Telephone follow up appointment date/time:  09/17/23 1 pm  Jahden Schara L. Mcarthur Speedy, RN, BSN, CCM [place]  [facility], [facility] RN Care Manager Direct Dial: [phone]  Fax: [phone]

## 2023-09-17 ENCOUNTER — Encounter: Payer: Self-pay | Admitting: *Deleted

## 2023-09-17 ENCOUNTER — Other Ambulatory Visit: Payer: Self-pay | Admitting: *Deleted

## 2023-09-17 NOTE — Patient Outreach (Signed)
 Complex Care Management   Visit Note  09/17/2023  Name:  Dawn Gonzales MRN: [id number] DOB: 12-05-1940  Situation: Referral received for Complex Care Management related to Heart Failure I obtained verbal consent from Patient.  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   26 mm Edwards Sapien 3 transcatheter heart valve placed via percutaneous right transfemoral approach    Severe aortic stenosis    Sleep apnea     Assessment: Patient Reported Symptoms:  Cognitive Cognitive Status: Alert and oriented to person, place, and time, Insightful and able to interpret abstract concepts, Normal speech and language skills Cognitive/Intellectual Conditions Management [RPT]: None reported or documented in medical history or problem list      Neurological Neurological Review of Symptoms: No symptoms reported Neurological Conditions:  (neuropathy) Neurological Self-Management Outcome: 4 (good)  HEENT   HEENT Conditions: Vision problem(s) HEENT Management Strategies: Routine screening HEENT Self-Management Outcome: 4 (good) Vision problem(s)  Cardiovascular Cardiovascular Symptoms Reported: No symptoms reported Does patient have uncontrolled Hypertension?: No Cardiovascular Conditions: Coronary artery disease, Dysrhythmia, Heart failure, High blood cholesterol, Hypertension Cardiovascular Management Strategies: Adequate rest, Diet modification, Fluid modification, Medical device,  Medication therapy, Routine screening Do You Have a Working Readable Scale?: Yes (has not weighed in a while Unable to get on scales) Cardiovascular Self-Management Outcome: 4 (good)  Respiratory Respiratory Symptoms Reported: Productive cough, Shortness of breath Other Respiratory Symptoms: restrictive lung diseaseOSA wears bipap Respiratory Conditions: Cough, Shortness of breath, Sleep disordered breathing Respiratory Management Strategies: routine screening, oxygen  therapy, adequate rest, breathing exercise, medication therapy Oxygen  Therapy Device: nasal cannula Oxygen  Therapy Times: continuous Oxygen  Flow (L/min): 2-4 liters Respiratory Self-Management Outcome: 4 (good)  Endocrine Patient reports the following symptoms related to hypoglycemia or hyperglycemia : Not assessed    Gastrointestinal Gastrointestinal Symptoms Reported: Not assessed      Genitourinary Genitourinary Symptoms Reported: Frequency Genitourinary Conditions: Chronic kidney disease Other Genitourinary Conditions: lesion on right kidney, overactive bladder Genitourinary Management Strategies: Medication therapy Genitourinary Self-Management Outcome: 4 (good)  Integumentary Additional Integumentary Details: wound reported to be healed Skin Conditions: Wound, Other Other Skin Conditions: Reports she never received Lotrisone cream (but would like to have) and the nystatin  powder did not work (became like putty) for her yeast infection under her breasts Skin Comment: history of skin lesions, cellulitis, pressure injury of buttocks, candidiasis of skin deep tissue injury history  Musculoskeletal Musculoskelatal Symptoms Reviewed: Not assessed        Psychosocial Psychosocial Symptoms Reported: No symptoms reported Behavioral Health Conditions: Anxiety, Depression Behavioral Management Strategies: Adequate rest, Support system, Coping strategies Behavioral Health Self-Management Outcome: 4 (good) Major  Change/Loss/Stressor/Fears (CP): Medical condition, self Techniques to Cope with Loss/Stress/Change: Diversional activities Quality of Family Relationships: helpful, involved, supportive Do you feel physically threatened by others?: No      09/17/2023    3:02 PM  Depression screen [facility] 2/9  Decreased Interest 0  Down, Depressed, Hopeless 0  PHQ - 2 Score 0    Vitals:   09/17/23 1459  SpO2: 98%    Medications Reviewed Today     Reviewed by Arlyce Berger, RN (Registered Nurse) on 09/17/23 at 1323  Med List Status: <None>   Medication Order Taking? Sig Documenting Provider Last Dose Status Informant  acetaminophen  (TYLENOL ) 325 MG tablet [id number] Yes Take 2 tablets (650 mg total) by mouth every 6 (six) hours as needed for mild pain, fever or headache (or Fever >/= 101). Colin Dawley, MD Taking Active Child  albuterol  (VENTOLIN  HFA) 108 [id number] Base) MCG/ACT inhaler [id number] Yes Inhale 2 puffs every 4 hours by inhalation route. [provider] Taking Active   apixaban  (ELIQUIS ) 5 MG TABS tablet [id number] Yes Take 1 tablet (5 mg total) by mouth 2 (two) times daily. Gerard Knight, MD Taking Active   Calcium  Carbonate (CALCIUM -CARB 600 PO) [id number] Yes Take 600 mg by mouth daily. [provider] Taking Active Child  cholecalciferol  (VITAMIN D ) 1000 units tablet [id number] Yes Take 1,000 Units by mouth daily. [provider] Taking Active Child  clotrimazole-betamethasone  (LOTRISONE) cream [id number] No Apply 1 Application topically.  Patient not taking: Reported on 09/17/2023   [provider] Not Taking Active   dextromethorphan -guaiFENesin  (MUCINEX  DM) 30-600 MG 12hr tablet [id number] Yes Take 1 tablet by mouth 2 (two) times daily.  Patient taking differently: Take 1 tablet by mouth 2 (two) times daily as needed for cough.   Colin Dawley, MD Taking Active Child  diclofenac Sodium (VOLTAREN) 1 % GEL [id number] Yes Apply 2 g topically 4 (four)  times daily. [provider] Taking Active   doxycycline (VIBRA-TABS) 100 MG tablet [id number] No Take 1 tablet by mouth 2 (two) times daily.  Patient not taking: Reported on 09/17/2023   [provider] Not Taking Active   ferrous sulfate  325 (65 FE) MG tablet [id number] Yes Take 325 mg by mouth daily with breakfast. [provider] Taking Active Child  gabapentin  (NEURONTIN ) 300 MG capsule [id number] Yes Take 300 mg by mouth 2 (two) times daily. [provider] Taking Active Child           Med Note Kemp Patter   Wed Apr 05, 2022 12:16 PM)    ipratropium-albuterol  (DUONEB) 0.5-2.5 (3) MG/3ML SOLN [id number] Yes Take 3 mLs by nebulization 2 (two) times daily. Bobbetta Burnet, MD Taking Active Child  metoprolol  tartrate 37.5 MG TABS [id number] Yes Take 1 tablet (37.5 mg total) by mouth 2 (two) times daily. Demaris Fillers, MD Taking Active   Metoprolol  Tartrate 37.5 MG TABS [id number]  Take 1 tablet by mouth 2 (two) times daily. [provider]  Active   Multiple Vitamin (MULTIVITAMIN WITH MINERALS) TABS tablet [id number]  Take 1 tablet by mouth daily. [provider]  Active Child  nystatin  (MYCOSTATIN /NYSTOP ) powder [id number] No APPLY TO AFFECTED AREA TWICE A DAY  Patient not taking: Reported on 09/17/2023   [provider] Not Taking Active Child           Med Note Author Board, ANGELICA G   Sat Jul 21, 2023  7:26 PM)    nystatin  (MYCOSTATIN /NYSTOP ) powder [id number] No Apply topically 2 (two) times daily.  Patient not taking: Reported on 09/17/2023   [provider] Not Taking Active   OXYGEN  [id number] Yes Inhale 3 L into the lungs. With long tube mostly 1-1.5 [provider] Taking Active Child  potassium chloride  (KLOR-CON )  10 MEQ tablet [id number] Yes Take 1 tablet (10 mEq total) by mouth daily. Take While taking Demadex /Torsemide  Colin Dawley, MD Taking Active Child  pravastatin  (PRAVACHOL ) 20 MG tablet [id number] Yes  Take 20 mg by mouth daily. [provider] Taking Active Child  silver sulfADIAZINE (SILVADENE) 1 % cream [id number] Yes Apply 1 Application topically daily as needed (wound). [provider] Taking Active Child  silver sulfADIAZINE (SILVADENE) 1 % cream [id number] No Apply topically 2 (two) times daily.  Patient not taking: Reported on 09/17/2023   [provider] Not Taking Active   sulfamethoxazole-trimethoprim (BACTRIM DS) 800-160 MG tablet [id number] No Take 1 tablet by mouth every 12 (twelve) hours.  Patient not taking: Reported on 09/17/2023   [provider] Not Taking Active   torsemide  (DEMADEX ) 20 MG tablet [id number] Yes TAKE 2 TABLETS BY MOUTH TWICE A DAY Gerard Knight, MD Taking Active   Turmeric (QC TUMERIC COMPLEX) 500 MG CAPS [id number] Yes Take 1 capsule by mouth at bedtime. [provider] Taking Active Child  venlafaxine  (EFFEXOR ) 37.5 MG tablet [id number] Yes Take 37.5 mg by mouth daily. [provider] Taking Active Child           Med Note Kemp Patter   Wed Apr 05, 2022 12:16 PM)    Vibegron  (GEMTESA ) 75 MG TABS [id number] Yes Take 1 tablet (75 mg total) by mouth daily. McKenzie, Arden Beck, MD Taking Active Child  vitamin C  (ASCORBIC ACID ) 500 MG tablet [id number] Yes Take 500 mg by mouth daily. [provider] Taking Active Child            Recommendation:   PCP Follow-up DME requests:  other portable concentrator order is needed to help her go outside to water her plants without accidents occurring from her oxygen  tubing                                                                     Patient reports inability to get on a scale at home to monitor for CHF weight gain- she will need another She will need an alternative scale like a chair or wheelchair scale (weigh the chair or wheelchair then the patient while on the chair or in the wheelchair Patient with history of recurrent yeast infection under  breasts,Patient requests/needs an order for Lortisone to use as needed.  Encouraged to monitor for possible cause (strong antibiotic usage)  Follow Up Plan:   Telephone follow up appointment date/time:  10/18/23 1100  Charlye Spare L. Mcarthur Speedy, RN, BSN, CCM [place]  [facility], [facility] RN Care Manager Direct Dial: [phone]  Fax: [phone]

## 2023-09-17 NOTE — Patient Instructions (Addendum)
 Visit Information  Thank you for taking time to visit with me today. Please don't hesitate to contact me if I can be of assistance to you before our next scheduled appointment.  Your next care management appointment is by telephone on 10/18/23 at 1100  Please keep under breast skin clean & dry. Requests have been made for a portable oxygen  tank & Lotrisone cream to used as needed You have been provided education on skin infection and a heart failure action plan- It tells you what to do when you have symptoms  Please call the care guide team at [phone] if you need to cancel, schedule, or reschedule an appointment.   Please call the Suicide and Crisis Lifeline: 988 call the [place]  National Suicide Prevention Lifeline: 940 408 7743 or TTY: (613)031-8360 TTY 2040640397) to talk to a trained counselor call 1-800-273-TALK (toll free, 24 hour hotline) go to [facility] [address], [place] [phone]) call the [facility] Crisis Line: [phone] call 911 if you are experiencing a Mental Health or Behavioral Health Crisis or need someone to talk to.  Alexah Kivett L. Mcarthur Speedy, RN, BSN, CCM [place]  [facility], [facility] RN Care Manager Direct Dial: [phone]  Fax: [phone]

## 2023-10-04 DIAGNOSIS — G44009 Cluster headache syndrome, unspecified, not intractable: Secondary | ICD-10-CM | POA: Diagnosis not present

## 2023-10-04 DIAGNOSIS — G4733 Obstructive sleep apnea (adult) (pediatric): Secondary | ICD-10-CM | POA: Diagnosis not present

## 2023-10-08 ENCOUNTER — Ambulatory Visit (INDEPENDENT_AMBULATORY_CARE_PROVIDER_SITE_OTHER): Payer: Medicare PPO

## 2023-10-08 DIAGNOSIS — I442 Atrioventricular block, complete: Secondary | ICD-10-CM | POA: Diagnosis not present

## 2023-10-09 DIAGNOSIS — I5032 Chronic diastolic (congestive) heart failure: Secondary | ICD-10-CM | POA: Diagnosis not present

## 2023-10-09 DIAGNOSIS — Z79899 Other long term (current) drug therapy: Secondary | ICD-10-CM | POA: Diagnosis not present

## 2023-10-09 DIAGNOSIS — L89159 Pressure ulcer of sacral region, unspecified stage: Secondary | ICD-10-CM | POA: Diagnosis not present

## 2023-10-09 DIAGNOSIS — R600 Localized edema: Secondary | ICD-10-CM | POA: Diagnosis not present

## 2023-10-09 DIAGNOSIS — R000 Tachycardia, unspecified: Secondary | ICD-10-CM | POA: Diagnosis not present

## 2023-10-09 DIAGNOSIS — N1831 Chronic kidney disease, stage 3a: Secondary | ICD-10-CM | POA: Diagnosis not present

## 2023-10-09 DIAGNOSIS — E782 Mixed hyperlipidemia: Secondary | ICD-10-CM | POA: Diagnosis not present

## 2023-10-09 DIAGNOSIS — I872 Venous insufficiency (chronic) (peripheral): Secondary | ICD-10-CM | POA: Diagnosis not present

## 2023-10-09 DIAGNOSIS — M545 Low back pain, unspecified: Secondary | ICD-10-CM | POA: Diagnosis not present

## 2023-10-09 DIAGNOSIS — R269 Unspecified abnormalities of gait and mobility: Secondary | ICD-10-CM | POA: Diagnosis not present

## 2023-10-09 LAB — CUP PACEART REMOTE DEVICE CHECK
Battery Voltage: 65
Date Time Interrogation Session: 20250609100034
Implantable Lead Connection Status: 753985
Implantable Lead Connection Status: 753985
Implantable Lead Implant Date: 20200311
Implantable Lead Implant Date: 20200311
Implantable Lead Location: 753859
Implantable Lead Location: 753860
Implantable Lead Model: 377
Implantable Lead Model: 377
Implantable Lead Serial Number: 81024123
Implantable Lead Serial Number: 81075341
Implantable Pulse Generator Implant Date: 20200311
Pulse Gen Model: 407145
Pulse Gen Serial Number: 69550148

## 2023-10-12 ENCOUNTER — Encounter: Payer: Self-pay | Admitting: Cardiology

## 2023-10-12 ENCOUNTER — Ambulatory Visit: Payer: Self-pay | Admitting: Internal Medicine

## 2023-10-16 NOTE — Progress Notes (Signed)
 [facility] [address], [place] [address]   Clinic Day:  10/17/2023  Referring physician: Omie Bickers, MD  Patient Care Team: Omie Bickers, MD as PCP - General (Internal Medicine) Tammie Fall, MD as PCP - Electrophysiology (Cardiology) Gerard Knight, MD as PCP - Cardiology (Cardiology) Arlyce Berger, RN as [facility] Management Wendi Ham, NP as Nurse Practitioner (Family Medicine)   ASSESSMENT & PLAN:   Assessment:  1.  Macrocytic anemia: - Patient seen at the request of Wendi Ham, NP - 10/09/2023: Hb-7.7, MCV-103, WBC-6.6, PLT-303, creatinine 0.8, B12-445, folate > 20, 6% saturation, TIBC-364 - 07/30/2023: Hb-10.9, MCV-95, creatinine-1.03 - Last colonoscopy done in [place], [place] prior to her moving down to [place] in 2019. - Eliquis  was started on 08/01/2023, discontinued on 10/12/2023 as her hemoglobin dropped down to 7.7. - She denies any BRBPR/melena.  No ice pica. - She has been on iron pill daily for many years.  2.  Social/family history: - She lives by herself at home and is independent of ADLs and does some cooking and cleaning.  She walks with the help of walker due to arthritis send history of CVA.  Her daughter lives next door.  Quit smoking in the 1980s. - No family history of leukemia.  Mother died of uterine cancer.  Plan:  1.  Macrocytic anemia: - Anemia most likely from blood loss from being on Eliquis .  Her hemoglobin was normal in March prior to starting Eliquis .  Macrocytosis likely from reticulocytosis. - CBC today shows hemoglobin 8.7.  Ferritin is low at 37 and saturation 7.  She also has mild CKD. - Discussed parenteral iron therapy with INFeD.  Discussed side effects including rare chance of anaphylactic reaction.  Will give premedication. - We have sent MMA, copper levels as her B12 and folic acid were normal.  Will also check for SPEP, free light chains and immunofixation.  Will check stool for occult  blood.  Will also do hemolysis labs. - She will proceed with INFeD.  RTC 6 weeks with repeat CBC, ferritin and iron panel.  We will discuss the results of above labs at that time.   Orders Placed This Encounter  Procedures   CBC with Differential    Standing Status:   Future    Number of Occurrences:   1    Expected Date:   10/17/2023    Expiration Date:   01/15/2024   Reticulocytes    Standing Status:   Future    Number of Occurrences:   1    Expected Date:   10/17/2023    Expiration Date:   01/15/2024   Lactate dehydrogenase    Standing Status:   Future    Number of Occurrences:   1    Expected Date:   10/17/2023    Expiration Date:   01/15/2024   Copper, serum    Standing Status:   Future    Number of Occurrences:   1    Expected Date:   10/17/2023    Expiration Date:   01/15/2024   Iron and TIBC ([facility] ONLY)    Standing Status:   Future    Number of Occurrences:   1    Expected Date:   10/17/2023    Expiration Date:   01/15/2024   Ferritin    Standing Status:   Future    Number of Occurrences:   1    Expected Date:  10/17/2023    Expiration Date:   01/15/2024   Methylmalonic acid, serum    Standing Status:   Future    Number of Occurrences:   1    Expected Date:   10/17/2023    Expiration Date:   01/15/2024   Occult blood x 1 card to lab, stool    Standing Status:   Future    Expected Date:   10/24/2023    Expiration Date:   01/22/2024   Occult blood x 1 card to lab, stool    Standing Status:   Future    Expected Date:   10/24/2023    Expiration Date:   01/22/2024   Occult blood x 1 card to lab, stool    Standing Status:   Future    Expected Date:   10/24/2023    Expiration Date:   01/22/2024   Kappa/lambda light chains    Standing Status:   Future    Number of Occurrences:   1    Expected Date:   10/17/2023    Expiration Date:   01/15/2024   Immunofixation electrophoresis    Standing Status:   Future    Number of Occurrences:   1    Expected Date:    10/17/2023    Expiration Date:   01/15/2024   Protein electrophoresis, serum    Standing Status:   Future    Number of Occurrences:   1    Expected Date:   10/17/2023    Expiration Date:   01/15/2024   CBC    Standing Status:   Future    Expected Date:   11/26/2023    Expiration Date:   02/24/2024   Iron and TIBC ([facility] ONLY)    Standing Status:   Future    Expected Date:   11/26/2023    Expiration Date:   02/24/2024   Ferritin    Standing Status:   Future    Expected Date:   11/26/2023    Expiration Date:   02/24/2024   Direct antiglobulin test    Standing Status:   Future    Number of Occurrences:   1    Expected Date:   10/17/2023    Expiration Date:   01/15/2024   Sample to Blood Bank(Blood Bank Hold)    Standing Status:   Future    Number of Occurrences:   1    Expected Date:   10/17/2023    Expiration Date:   10/16/2024      Hurman Maiden R Teague,acting as a scribe for Paulett Boros, MD.,have documented all relevant documentation on the behalf of Paulett Boros, MD,as directed by  Paulett Boros, MD while in the presence of Paulett Boros, MD.   I, Paulett Boros MD, have reviewed the above documentation for accuracy and completeness, and I agree with the above.   Paulett Boros, MD   6/18/202510:21 AM  CHIEF COMPLAINT/PURPOSE OF CONSULT:   Diagnosis: Anemia from blood loss  Current Therapy: INFeD  HISTORY OF PRESENT ILLNESS:   Dawn Gonzales is a 83 y.o. female presenting to clinic today for evaluation of anemia at the request of Wendi Ham, NP.  Patient has a medical history of CKD stage 3a, hypertension, CHF, A-fib, atherosclerosis of aorta, aneurysm of thoracic aorta, restrictive lung disease, sleep apnea, hyperlipidemia, vitamin D  deficiency, polyneuropathy, and idiopathic peripheral neuropathy.   Terrilee was seen by [facility] NP on 10/09/23 for same day labs. His most recent CBC diff from 10/09/23 showed low RBC  at 2.53,  low HGB at 7.7, low HCT at 26.1, elevated MCV at 103, and elevated MCHC at 29.5. Folate from 07/21/23 was normal being over 40.0. Vitamin B12 from 07/21/23 was normal at 420.   Today, she states that she is doing well overall. Her appetite level is at 100%. Her energy level is at 60%.  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   26 mm Edwards Sapien 3 transcatheter heart valve placed via percutaneous right transfemoral approach    Severe aortic stenosis    Sleep apnea     Surgical History: Past Surgical History:  Procedure Laterality Date   Abdominal gangrene     ADENOIDECTOMY     Cataract surgery     COLONOSCOPY     ENDOSCOPIC RETROGRADE CHOLANGIOPANCREATOGRAPHY (ERCP) WITH PROPOFOL  N/A 02/08/2018   Procedure: ENDOSCOPIC RETROGRADE CHOLANGIOPANCREATOGRAPHY (ERCP) WITH PROPOFOL ;  Surgeon: Normie Becton., MD;  Location: [facility] ENDOSCOPY;  Service: Gastroenterology;  Laterality: N/A;   EUS  02/08/2018   Procedure: UPPER ENDOSCOPIC ULTRASOUND (EUS) LINEAR;  Surgeon: Brice Campi Albino Alu., MD;  Location: [facility] ENDOSCOPY;  Service: Gastroenterology;;   EYE SURGERY     IR FLUORO RM 30-60 MIN  03/27/2018   IR PERC CHOLECYSTOSTOMY  02/09/2018   IR RADIOLOGIST EVAL & MGMT  03/26/2018   IR RADIOLOGY PERIPHERAL GUIDED IV START  05/22/2018   IR RADIOLOGY PERIPHERAL GUIDED IV START  05/30/2018   IR RADIOLOGY PERIPHERAL GUIDED IV START  06/05/2018   IR US  GUIDE VASC ACCESS RIGHT  05/22/2018   IR US  GUIDE VASC ACCESS RIGHT  05/30/2018   IR US   GUIDE VASC ACCESS RIGHT  06/05/2018   PACEMAKER IMPLANT N/A 07/10/2018   Procedure: PACEMAKER IMPLANT;  Surgeon: Tammie Fall, MD;  Location: [facility] INVASIVE CV LAB;  Service: Cardiovascular;  Laterality: N/A;   REMOVAL OF STONES  02/08/2018   Procedure: REMOVAL OF STONES;  Surgeon: Brice Campi Albino Alu., MD;  Location: [facility] ENDOSCOPY;  Service: Gastroenterology;;   [address]  02/08/2018   Procedure: [address];  Surgeon: Mansouraty, Albino Alu., MD;  Location: [facility] ENDOSCOPY;  Service: Gastroenterology;;   TONSILLECTOMY     TOOTH EXTRACTION N/A 06/18/2018   Procedure: DENTAL RESTORATION/EXTRACTIONS;  Surgeon: Ascencion Lava, DDS;  Location[facility] OR;  Service: Oral Surgery;  Laterality: N/A;   TRANSCATHETER AORTIC VALVE REPLACEMENT, TRANSFEMORAL N/A 07/09/2018   Procedure: TRANSCATHETER AORTIC VALVE REPLACEMENT, TRANSFEMORAL;  Surgeon: Arnoldo Lapping, MD;  Location: [facility] INVASIVE CV LAB;  Service: Cardiovascular;  Laterality: N/A;   Tummy tuck      Social History: Social History   Socioeconomic History   Marital status: Married    Spouse name: Not on file   Number of children: 3   Years of education: Not on file   Highest education level: Not on file  Occupational History   Occupation: Retired houswife  Tobacco Use   Smoking status: Former    Current packs/day: 0.00    Types:  Cigarettes    Quit date: 05/23/1978    Years since quitting: 45.4    Passive exposure: Never   Smokeless tobacco: Never  Vaping Use   Vaping status: Never Used  Substance and Sexual Activity   Alcohol use: Not Currently    Comment: Occasional   Drug use: Never   Sexual activity: Not on file  Other Topics Concern   Not on file  Social History Narrative   Not on file   Social Drivers of Health   Financial Resource Strain: Low Risk  (09/17/2023)   Overall Financial Resource Strain (CARDIA)    Difficulty of Paying Living Expenses: Not hard at all  Food Insecurity: No Food Insecurity (10/17/2023)    Hunger Vital Sign    Worried About Running Out of Food in the Last Year: Never true    Ran Out of Food in the Last Year: Never true  Transportation Needs: No Transportation Needs (10/17/2023)   PRAPARE - Administrator, Civil Service (Medical): No    Lack of Transportation (Non-Medical): No  Physical Activity: Insufficiently Active (09/03/2023)   Exercise Vital Sign    Days of Exercise per Week: 3 days    Minutes of Exercise per Session: 10 min  Stress: No Stress Concern Present (09/17/2023)   [employer] of Occupational Health - Occupational Stress Questionnaire    Feeling of Stress : Not at all  Social Connections: Socially Isolated (07/21/2023)   Social Connection and Isolation Panel    Frequency of Communication with Friends and Family: More than three times a week    Frequency of Social Gatherings with Friends and Family: More than three times a week    Attends Religious Services: Never    Database administrator or Organizations: No    Attends Banker Meetings: Never    Marital Status: Widowed  Intimate Partner Violence: Not At Risk (10/17/2023)   Humiliation, Afraid, Rape, and Kick questionnaire    Fear of Current or Ex-Partner: No    Emotionally Abused: No    Physically Abused: No    Sexually Abused: No    Family History: Family History  Problem Relation Age of Onset   Cancer Mother    Hypertension Father    Arthritis Father    Heart attack Father     Current Medications:  Current Outpatient Medications:    acetaminophen  (TYLENOL ) 325 MG tablet, Take 2 tablets (650 mg total) by mouth every 6 (six) hours as needed for mild pain, fever or headache (or Fever >/= 101)., Disp: 12 tablet, Rfl: 0   albuterol  (VENTOLIN  HFA) 108 (90 Base) MCG/ACT inhaler, Inhale 2 puffs every 4 hours by inhalation route., Disp: , Rfl:    apixaban  (ELIQUIS ) 5 MG TABS tablet, Take 1 tablet (5 mg total) by mouth 2 (two) times daily., Disp: 60 tablet, Rfl: 6   Calcium   Carbonate (CALCIUM -CARB 600 PO), Take 600 mg by mouth daily., Disp: , Rfl:    cholecalciferol  (VITAMIN D ) 1000 units tablet, Take 1,000 Units by mouth daily., Disp: , Rfl:    clotrimazole-betamethasone  (LOTRISONE) cream, Apply 1 Application topically., Disp: , Rfl:    dextromethorphan -guaiFENesin  (MUCINEX  DM) 30-600 MG 12hr tablet, Take 1 tablet by mouth 2 (two) times daily. (Patient taking differently: Take 1 tablet by mouth 2 (two) times daily as needed for cough.), Disp: 20 tablet, Rfl: 0   diclofenac Sodium (VOLTAREN) 1 % GEL, Apply 2 g topically 4 (four) times daily., Disp: , Rfl:  doxycycline (VIBRA-TABS) 100 MG tablet, Take 1 tablet by mouth 2 (two) times daily., Disp: , Rfl:    ferrous sulfate  325 (65 FE) MG tablet, Take 325 mg by mouth daily with breakfast., Disp: , Rfl:    gabapentin  (NEURONTIN ) 300 MG capsule, Take 300 mg by mouth 2 (two) times daily., Disp: , Rfl:    ipratropium-albuterol  (DUONEB) 0.5-2.5 (3) MG/3ML SOLN, Take 3 mLs by nebulization 2 (two) times daily., Disp: 360 mL, Rfl: 0   metoprolol  tartrate 37.5 MG TABS, Take 1 tablet (37.5 mg total) by mouth 2 (two) times daily., Disp: 60 tablet, Rfl: 1   Multiple Vitamin (MULTIVITAMIN WITH MINERALS) TABS tablet, Take 1 tablet by mouth daily., Disp: , Rfl:    nystatin  (MYCOSTATIN /NYSTOP ) powder, APPLY TO AFFECTED AREA TWICE A DAY, Disp: , Rfl:    nystatin  (MYCOSTATIN /NYSTOP ) powder, Apply topically 2 (two) times daily., Disp: , Rfl:    OXYGEN , Inhale 3 L into the lungs. With long tube mostly 1-1.5, Disp: , Rfl:    potassium chloride  (KLOR-CON ) 10 MEQ tablet, Take 1 tablet (10 mEq total) by mouth daily. Take While taking Demadex /Torsemide , Disp: 30 tablet, Rfl: 2   pravastatin  (PRAVACHOL ) 20 MG tablet, Take 20 mg by mouth daily., Disp: , Rfl:    silver sulfADIAZINE (SILVADENE) 1 % cream, Apply 1 Application topically daily as needed (wound)., Disp: , Rfl:    torsemide  (DEMADEX ) 20 MG tablet, TAKE 2 TABLETS BY MOUTH TWICE A DAY,  Disp: 360 tablet, Rfl: 0   Turmeric (QC TUMERIC COMPLEX) 500 MG CAPS, Take 1 capsule by mouth at bedtime., Disp: , Rfl:    venlafaxine  (EFFEXOR ) 37.5 MG tablet, Take 37.5 mg by mouth daily., Disp: , Rfl:    Vibegron  (GEMTESA ) 75 MG TABS, Take 1 tablet (75 mg total) by mouth daily., Disp: 30 tablet, Rfl: 11   vitamin C  (ASCORBIC ACID ) 500 MG tablet, Take 500 mg by mouth daily., Disp: , Rfl:    Allergies: No Known Allergies  REVIEW OF SYSTEMS:   Review of Systems  Constitutional:  Negative for chills, fatigue and fever.  HENT:   Negative for lump/mass, mouth sores, nosebleeds, sore throat and trouble swallowing.   Eyes:  Negative for eye problems.  Respiratory:  Positive for cough and shortness of breath.   Cardiovascular:  Negative for chest pain, leg swelling and palpitations.  Gastrointestinal:  Negative for abdominal pain, constipation, diarrhea, nausea and vomiting.  Genitourinary:  Negative for bladder incontinence, difficulty urinating, dysuria, frequency, hematuria and nocturia.   Musculoskeletal:  Positive for arthralgias. Negative for back pain, flank pain, myalgias and neck pain.  Skin:  Negative for itching and rash.  Neurological:  Negative for dizziness, headaches and numbness.  Hematological:  Does not bruise/bleed easily.  Psychiatric/Behavioral:  Positive for depression. Negative for sleep disturbance and suicidal ideas. The patient is nervous/anxious.   All other systems reviewed and are negative.    VITALS:   Blood pressure (!) 146/47, pulse (!) 36, temperature 99.7 F (37.6 C), temperature source Tympanic, resp. rate 20, weight 260 lb (117.9 kg), SpO2 90%.  Wt Readings from Last 3 Encounters:  10/17/23 260 lb (117.9 kg)  08/28/23 259 lb (117.5 kg)  08/22/23 259 lb (117.5 kg)    Body mass index is 41.97 kg/m.   PHYSICAL EXAM:   Physical Exam Vitals and nursing note reviewed. Exam conducted with a chaperone present.  Constitutional:      Appearance: Normal  appearance.   Cardiovascular:     Rate and  Rhythm: Normal rate and regular rhythm.     Pulses: Normal pulses.     Heart sounds: Normal heart sounds.  Pulmonary:     Effort: Pulmonary effort is normal.     Breath sounds: Normal breath sounds.  Abdominal:     Palpations: Abdomen is soft. There is no hepatomegaly, splenomegaly or mass.     Tenderness: There is no abdominal tenderness.   Musculoskeletal:     Right lower leg: No edema.     Left lower leg: No edema.  Lymphadenopathy:     Cervical: No cervical adenopathy.     Right cervical: No superficial, deep or posterior cervical adenopathy.    Left cervical: No superficial, deep or posterior cervical adenopathy.     Upper Body:     Right upper body: No supraclavicular or axillary adenopathy.     Left upper body: No supraclavicular or axillary adenopathy.   Neurological:     General: No focal deficit present.     Mental Status: She is alert and oriented to person, place, and time.   Psychiatric:        Mood and Affect: Mood normal.        Behavior: Behavior normal.     LABS:   CBC    Component Value Date/Time   WBC 5.7 10/17/2023 0909   RBC 2.75 (L) 10/17/2023 0910   RBC 2.78 (L) 10/17/2023 0909   HGB 8.7 (L) 10/17/2023 0909   HCT 29.6 (L) 10/17/2023 0909   PLT 257 10/17/2023 0909   MCV 106.5 (H) 10/17/2023 0909   MCH 31.3 10/17/2023 0909   MCHC 29.4 (L) 10/17/2023 0909   RDW 15.2 10/17/2023 0909   LYMPHSABS 1.0 10/17/2023 0909   MONOABS 0.5 10/17/2023 0909   EOSABS 0.2 10/17/2023 0909   BASOSABS 0.0 10/17/2023 0909    CMP    Component Value Date/Time   NA 138 07/23/2023 0513   NA 141 05/10/2018 1022   K 4.2 07/23/2023 0513   CL 97 (L) 07/23/2023 0513   CO2 32 07/23/2023 0513   GLUCOSE 103 (H) 07/23/2023 0513   BUN 26 (H) 07/23/2023 0513   BUN 27 05/10/2018 1022   CREATININE 0.84 07/23/2023 0513   CALCIUM  8.9 07/23/2023 0513   PROT 8.1 07/21/2023 0940   ALBUMIN  4.1 07/21/2023 0940   AST 23 07/21/2023  0940   ALT 19 07/21/2023 0940   ALKPHOS 72 07/21/2023 0940   BILITOT 0.7 07/21/2023 0940   GFRNONAA >60 07/23/2023 0513   GFRAA >60 08/28/2019 0534    No results found for: CEA1, CEA / No results found for: CEA1, CEA No results found for: PSA1 No results found for: HQI696 No results found for: CAN125  No results found for: TOTALPROTELP, ALBUMINELP, A1GS, A2GS, BETS, BETA2SER, GAMS, MSPIKE, SPEI Lab Results  Component Value Date   TIBC 429 10/17/2023   FERRITIN 37 10/17/2023   IRONPCTSAT 7 (L) 10/17/2023   Lab Results  Component Value Date   LDH 116 10/17/2023     STUDIES:   CUP PACEART REMOTE DEVICE CHECK Result Date: 10/09/2023 PPM Scheduled remote reviewed. Normal device function.  Presenting rhythm:  AS/VS 1 AT episode, 22sec in duration, EGM c/w AT/AFL.  Hx of PAF, Eliquis  per EPIC Next remote 91 days. [place], CV[facility]

## 2023-10-17 ENCOUNTER — Inpatient Hospital Stay

## 2023-10-17 ENCOUNTER — Inpatient Hospital Stay: Attending: Hematology | Admitting: Hematology

## 2023-10-17 VITALS — BP 146/47 | HR 36 | Temp 99.7°F | Resp 20 | Wt 260.0 lb

## 2023-10-17 DIAGNOSIS — I130 Hypertensive heart and chronic kidney disease with heart failure and stage 1 through stage 4 chronic kidney disease, or unspecified chronic kidney disease: Secondary | ICD-10-CM | POA: Diagnosis not present

## 2023-10-17 DIAGNOSIS — Z8673 Personal history of transient ischemic attack (TIA), and cerebral infarction without residual deficits: Secondary | ICD-10-CM | POA: Diagnosis not present

## 2023-10-17 DIAGNOSIS — E785 Hyperlipidemia, unspecified: Secondary | ICD-10-CM

## 2023-10-17 DIAGNOSIS — D509 Iron deficiency anemia, unspecified: Secondary | ICD-10-CM | POA: Diagnosis not present

## 2023-10-17 DIAGNOSIS — Z8261 Family history of arthritis: Secondary | ICD-10-CM | POA: Insufficient documentation

## 2023-10-17 DIAGNOSIS — Z8249 Family history of ischemic heart disease and other diseases of the circulatory system: Secondary | ICD-10-CM | POA: Insufficient documentation

## 2023-10-17 DIAGNOSIS — Z9089 Acquired absence of other organs: Secondary | ICD-10-CM | POA: Insufficient documentation

## 2023-10-17 DIAGNOSIS — I4891 Unspecified atrial fibrillation: Secondary | ICD-10-CM | POA: Diagnosis not present

## 2023-10-17 DIAGNOSIS — G4730 Sleep apnea, unspecified: Secondary | ICD-10-CM | POA: Insufficient documentation

## 2023-10-17 DIAGNOSIS — N1831 Chronic kidney disease, stage 3a: Secondary | ICD-10-CM | POA: Diagnosis not present

## 2023-10-17 DIAGNOSIS — I5032 Chronic diastolic (congestive) heart failure: Secondary | ICD-10-CM | POA: Insufficient documentation

## 2023-10-17 DIAGNOSIS — I700 Atherosclerosis of aorta: Secondary | ICD-10-CM | POA: Diagnosis not present

## 2023-10-17 DIAGNOSIS — D508 Other iron deficiency anemias: Secondary | ICD-10-CM | POA: Diagnosis not present

## 2023-10-17 DIAGNOSIS — E559 Vitamin D deficiency, unspecified: Secondary | ICD-10-CM

## 2023-10-17 DIAGNOSIS — F32A Depression, unspecified: Secondary | ICD-10-CM

## 2023-10-17 DIAGNOSIS — R0602 Shortness of breath: Secondary | ICD-10-CM

## 2023-10-17 DIAGNOSIS — R059 Cough, unspecified: Secondary | ICD-10-CM

## 2023-10-17 DIAGNOSIS — Z87891 Personal history of nicotine dependence: Secondary | ICD-10-CM | POA: Diagnosis not present

## 2023-10-17 DIAGNOSIS — Z7901 Long term (current) use of anticoagulants: Secondary | ICD-10-CM | POA: Diagnosis not present

## 2023-10-17 DIAGNOSIS — Z79899 Other long term (current) drug therapy: Secondary | ICD-10-CM | POA: Diagnosis not present

## 2023-10-17 DIAGNOSIS — D7589 Other specified diseases of blood and blood-forming organs: Secondary | ICD-10-CM | POA: Diagnosis not present

## 2023-10-17 DIAGNOSIS — D539 Nutritional anemia, unspecified: Secondary | ICD-10-CM | POA: Insufficient documentation

## 2023-10-17 DIAGNOSIS — Z823 Family history of stroke: Secondary | ICD-10-CM | POA: Insufficient documentation

## 2023-10-17 DIAGNOSIS — I509 Heart failure, unspecified: Secondary | ICD-10-CM

## 2023-10-17 DIAGNOSIS — G629 Polyneuropathy, unspecified: Secondary | ICD-10-CM

## 2023-10-17 DIAGNOSIS — F419 Anxiety disorder, unspecified: Secondary | ICD-10-CM

## 2023-10-17 DIAGNOSIS — Z809 Family history of malignant neoplasm, unspecified: Secondary | ICD-10-CM | POA: Diagnosis not present

## 2023-10-17 LAB — CBC WITH DIFFERENTIAL/PLATELET
Abs Immature Granulocytes: 0.02 10*3/uL (ref 0.00–0.07)
Basophils Absolute: 0 10*3/uL (ref 0.0–0.1)
Basophils Relative: 0 %
Eosinophils Absolute: 0.2 10*3/uL (ref 0.0–0.5)
Eosinophils Relative: 4 %
HCT: 29.6 % — ABNORMAL LOW (ref 36.0–46.0)
Hemoglobin: 8.7 g/dL — ABNORMAL LOW (ref 12.0–15.0)
Immature Granulocytes: 0 %
Lymphocytes Relative: 17 %
Lymphs Abs: 1 10*3/uL (ref 0.7–4.0)
MCH: 31.3 pg (ref 26.0–34.0)
MCHC: 29.4 g/dL — ABNORMAL LOW (ref 30.0–36.0)
MCV: 106.5 fL — ABNORMAL HIGH (ref 80.0–100.0)
Monocytes Absolute: 0.5 10*3/uL (ref 0.1–1.0)
Monocytes Relative: 8 %
Neutro Abs: 4 10*3/uL (ref 1.7–7.7)
Neutrophils Relative %: 71 %
Platelets: 257 10*3/uL (ref 150–400)
RBC: 2.78 MIL/uL — ABNORMAL LOW (ref 3.87–5.11)
RDW: 15.2 % (ref 11.5–15.5)
WBC: 5.7 10*3/uL (ref 4.0–10.5)
nRBC: 0 % (ref 0.0–0.2)

## 2023-10-17 LAB — SAMPLE TO BLOOD BANK

## 2023-10-17 LAB — DIRECT ANTIGLOBULIN TEST (NOT AT ARMC)
DAT, IgG: NEGATIVE
DAT, complement: NEGATIVE

## 2023-10-17 LAB — IRON AND TIBC
Iron: 29 ug/dL (ref 28–170)
Saturation Ratios: 7 % — ABNORMAL LOW (ref 10.4–31.8)
TIBC: 429 ug/dL (ref 250–450)
UIBC: 400 ug/dL

## 2023-10-17 LAB — FERRITIN: Ferritin: 37 ng/mL (ref 11–307)

## 2023-10-17 LAB — RETICULOCYTES
Immature Retic Fract: 24.7 % — ABNORMAL HIGH (ref 2.3–15.9)
RBC.: 2.75 MIL/uL — ABNORMAL LOW (ref 3.87–5.11)
Retic Count, Absolute: 112.5 10*3/uL (ref 19.0–186.0)
Retic Ct Pct: 4.1 % — ABNORMAL HIGH (ref 0.4–3.1)

## 2023-10-17 LAB — LACTATE DEHYDROGENASE: LDH: 116 U/L (ref 98–192)

## 2023-10-17 NOTE — Telephone Encounter (Signed)
**Note De-identified  [place] Obfuscation** Please advise 

## 2023-10-17 NOTE — Patient Instructions (Signed)
 You were seen and examined today by Dr. Ellin Saba. Dr. Ellin Saba is a hematologist, meaning that he specializes in blood abnormalities. Dr. Ellin Saba discussed your past medical history, family history of cancers/blood conditions and the events that led to you being here today.  You were referred to Dr. Ellin Saba due to anemia (low hemoglobin).  Dr. Ellin Saba has recommended additional labs today for further evaluation.  Follow-up as scheduled.

## 2023-10-18 ENCOUNTER — Other Ambulatory Visit: Payer: Self-pay

## 2023-10-18 ENCOUNTER — Other Ambulatory Visit: Payer: Self-pay | Admitting: *Deleted

## 2023-10-18 LAB — PROTEIN ELECTROPHORESIS, SERUM
A/G Ratio: 1 (ref 0.7–1.7)
Albumin ELP: 3.1 g/dL (ref 2.9–4.4)
Alpha-1-Globulin: 0.3 g/dL (ref 0.0–0.4)
Alpha-2-Globulin: 0.7 g/dL (ref 0.4–1.0)
Beta Globulin: 1 g/dL (ref 0.7–1.3)
Gamma Globulin: 1.1 g/dL (ref 0.4–1.8)
Globulin, Total: 3.1 g/dL (ref 2.2–3.9)
Total Protein ELP: 6.2 g/dL (ref 6.0–8.5)

## 2023-10-18 LAB — KAPPA/LAMBDA LIGHT CHAINS
Kappa free light chain: 36.5 mg/L — ABNORMAL HIGH (ref 3.3–19.4)
Kappa, lambda light chain ratio: 1.5 (ref 0.26–1.65)
Lambda free light chains: 24.4 mg/L (ref 5.7–26.3)

## 2023-10-18 NOTE — Patient Outreach (Signed)
 Complex Care Management   Visit Note  10/18/2023  Name:  Dawn Gonzales MRN: [id number] DOB: [birth year]  Situation: Referral received for Complex Care Management related to Heart Failure I obtained verbal consent from Caregiver Patient.  Visit completed with Dawn Gonzales and her daughter, Dawn Gonzales  on the phone  Patient showing gradual improvements after experiencing low oxygen  SATs, low hemoglobin value PCP referred her to hematologist & discontinued the Eliquis  SATs improved  Scheduled for iron infusion 10/19/23 Patient reports not noting change in fatigue status just yet  Patient and daughter voiced appreciation of pcp prompt assistance in treatment plan  Background:   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   26 mm Edwards Sapien 3 transcatheter heart valve placed via percutaneous right transfemoral approach    Severe aortic stenosis    Sleep apnea     Assessment: Patient Reported Symptoms:  Cognitive Cognitive Status: Alert and oriented to person, place, and time, Other:, Normal speech and language skills (noted delay in answering questions, questions repeated by RN CM or daughter before responding) Cognitive/Intellectual Conditions Management [RPT]: None reported or documented in medical history or problem list      Neurological Neurological Review of Symptoms: No symptoms reported Neurological Self-Management Outcome: 4 (good)  HEENT HEENT Symptoms Reported: No symptoms reported HEENT Self-Management Outcome: 4 (good)    Cardiovascular Cardiovascular  Symptoms Reported: Fatigue, Other: Other Cardiovascular Symptoms: fatigue related to anemia due to eliquis  use, eliquis  discontinued, seeing hematologist and to have iron infusion on 10/19/23 Does patient have uncontrolled Hypertension?: No Cardiovascular Management Strategies: Adequate rest, Activity, Exercise, Weight management, Routine screening, Medication therapy, Medical device Cardiovascular Self-Management Outcome: 4 (good) Cardiovascular Comment: now walking more and keeping compression hose  Respiratory Respiratory Symptoms Reported: Productive cough Additional Respiratory Details: O2 sat 92-96% normal 95% at low in was restig on 88-90% Respiratory Conditions: Cough, Shortness of breath, Sleep disordered breathing Respiratory Management Strategies: activity, adequate rest, breathing techniques, breathing exercise, exercise, medication therapy, oxygen  therapy, routine screening Oxygen  Therapy Device: nasal cannula Oxygen  Therapy Times: continuous Oxygen  Flow (L/min): 2*4 liters Respiratory Self-Management Outcome: 4 (good)  Endocrine Patient reports the following symptoms related to hypoglycemia or hyperglycemia : Weakness or fatigue Is patient diabetic?: No Endocrine Conditions: Vitamin D  deficiency Endocrine Management Strategies: Medication therapy, Adequate rest, Routine screening Endocrine Self-Management Outcome: 4 (good) Endocrine Comment: improving  Gastrointestinal Gastrointestinal Symptoms Reported: Obesity Gastrointestinal Self-Management Outcome: 4 (good) Nutrition Risk Screen (CP): No indicators present  Genitourinary Genitourinary Symptoms Reported: No symptoms reported Genitourinary Conditions: Chronic kidney disease, Frequency Other Genitourinary Conditions: lesion on right kidney, overactive bladder Genitourinary Management Strategies: Medication therapy, Adequate rest, Coping strategies Genitourinary Self-Management Outcome: 4 (good)  Integumentary Integumentary  Symptoms Reported: Skin changes Additional Integumentary Details: yeast rash under breast improving with current treatment Skin Conditions: Other Other Skin Conditions: using cream under breasts Skin Management Strategies: Activity, Adequate rest, Diet modification, Medication therapy, Routine screening Skin Self-Management Outcome: 4 (good)  Musculoskeletal Musculoskelatal Symptoms Reviewed: Weakness  Musculoskeletal Conditions: Joint pain, Mobility limited, Osteoarthritis, Unsteady gait Musculoskeletal Management Strategies: Activity, Adequate rest, Medication therapy, Routine screening, Medical device Musculoskeletal Self-Management Outcome: 4 (good)      Psychosocial Psychosocial Symptoms Reported: No symptoms reported            10/18/2023   12:25 PM  Depression screen PHQ 2/9  Decreased Interest 0  Down, Depressed, Hopeless 0  PHQ - 2 Score 0    There were no vitals filed for this visit.  Medications Reviewed Today     Reviewed by Arlyce Berger, RN (Registered Nurse) on 10/18/23 at 1213  Med List Status: <None>   Medication Order Taking? Sig Documenting Provider Last Dose Status Informant  acetaminophen  (TYLENOL ) 325 MG tablet [id number] Yes Take 2 tablets (650 mg total) by mouth every 6 (six) hours as needed for mild pain, fever or headache (or Fever >/= 101). Colin Dawley, MD  Active Child  albuterol  (VENTOLIN  HFA) 108 (90 Base) MCG/ACT inhaler [id number] Yes Inhale 2 puffs every 4 hours by inhalation route. [provider]  Active   apixaban  (ELIQUIS ) 5 MG TABS tablet [id number]  Take 1 tablet (5 mg total) by mouth 2 (two) times daily.  Patient not taking: Reported on 10/18/2023   Gerard Knight, MD  Active   Calcium  Carbonate (CALCIUM -CARB 600 PO) [id number] Yes Take 600 mg by mouth daily. [provider]  Active Child  cholecalciferol  (VITAMIN D ) 1000 units tablet [id number] Yes Take 1,000 Units by mouth daily. [provider]  Active  Child  clotrimazole-betamethasone  (LOTRISONE) cream [id number] Yes Apply 1 Application topically. [provider]  Active   dextromethorphan -guaiFENesin  (MUCINEX  DM) 30-600 MG 12hr tablet [id number] Yes Take 1 tablet by mouth 2 (two) times daily. Colin Dawley, MD  Active Child  diclofenac Sodium (VOLTAREN) 1 % GEL [id number] Yes Apply 2 g topically 4 (four) times daily. [provider]  Active   doxycycline (VIBRA-TABS) 100 MG tablet [id number] Yes Take 1 tablet by mouth 2 (two) times daily. [provider]  Active   ferrous sulfate  325 (65 FE) MG tablet [id number] Yes Take 325 mg by mouth daily with breakfast. [provider]  Active Child  gabapentin  (NEURONTIN ) 300 MG capsule [id number] Yes Take 300 mg by mouth 2 (two) times daily. [provider]  Active Child           Med Note Kemp Patter   Wed Apr 05, 2022 12:16 PM)    ipratropium-albuterol  (DUONEB) 0.5-2.5 (3) MG/3ML SOLN [id number] Yes Take 3 mLs by nebulization 2 (two) times daily. Bobbetta Burnet, MD  Active Child  metoprolol  tartrate 37.5 MG TABS [id number] Yes Take 1 tablet (37.5 mg total) by mouth 2 (two) times daily. Demaris Fillers, MD  Active   Multiple Vitamin (MULTIVITAMIN WITH MINERALS) TABS tablet [id number] Yes Take 1 tablet by mouth daily. [provider]  Active Child  nystatin  (MYCOSTATIN /NYSTOP ) powder [id number]  APPLY TO AFFECTED AREA TWICE A DAY  Patient not taking: Reported on 10/18/2023   [provider]  Active Child           Med Note Author Board, ANGELICA G   Sat Jul 21, 2023  7:26 PM)    nystatin  (MYCOSTATIN /NYSTOP ) powder [id number] Yes Apply topically 2 (two) times daily. [provider]  Active   OXYGEN  [id number] Yes Inhale 3 L into the lungs. With long tube mostly 1-1.5 [provider]  Active Child  potassium chloride  (KLOR-CON ) 10 MEQ tablet [id number] Yes Take  1 tablet (10 mEq total) by mouth daily. Take While taking Demadex /Torsemide   Colin Dawley, MD  Active Child  pravastatin  (PRAVACHOL ) 20 MG tablet [id number] Yes Take 20 mg by mouth daily. [provider]  Active Child  silver sulfADIAZINE (SILVADENE) 1 % cream [id number] Yes Apply 1 Application topically daily as needed (wound). [provider]  Active Child  torsemide  (DEMADEX ) 20 MG tablet [id number] Yes TAKE 2 TABLETS BY MOUTH TWICE A DAY Gerard Knight, MD  Active   Turmeric (QC TUMERIC COMPLEX) 500 MG CAPS [id number] Yes Take 1 capsule by mouth at bedtime. [provider]  Active Child  venlafaxine  (EFFEXOR ) 37.5 MG tablet [id number] Yes Take 37.5 mg by mouth daily. [provider]  Active Child           Med Note Kemp Patter   Wed Apr 05, 2022 12:16 PM)    Vibegron  (GEMTESA ) 75 MG TABS [id number] Yes Take 1 tablet (75 mg total) by mouth daily. McKenzie, Arden Beck, MD  Active Child  vitamin C  (ASCORBIC ACID ) 500 MG tablet [id number] Yes Take 500 mg by mouth daily. [provider]  Active Child            Recommendation:   Specialty provider follow-up oncologist/hematologist 10/19/23 infusion Continue Current Plan of Care Continue to monitor for possible need of change in treatment for yeast infection under her breasts   Follow Up Plan:   Telephone follow up appointment date/time:  11/30/23 1100  Kerolos Nehme L. Mcarthur Speedy, RN, BSN, CCM [employer]  [facility], [facility] RN Care Manager Direct Dial: [phone]  Fax: [phone]

## 2023-10-18 NOTE — Patient Instructions (Signed)
 Visit Information  Thank you for taking time to visit with me today. Please don't hesitate to contact me if I can be of assistance to you before our next scheduled appointment.  Your next care management appointment is by telephone on 11/30/23 at 1100  Please update your pcp or RN CM if your rash under your breast does not improve. Pleas consider preventative care to include shingles, tetanus and pneumonia vaccines. The shingles shot can be obtained at your local pharmacy It is a 2 part series. The others can be obtained at your pcp office  Please call the care guide team at [phone] if you need to cancel, schedule, or reschedule an appointment.   Please call the Suicide and Crisis Lifeline: 988 call the [place]  National Suicide Prevention Lifeline: 214-529-7412 or TTY: 905 693 4518 TTY 812 284 7238) to talk to a trained counselor call 1-800-273-TALK (toll free, 24 hour hotline) call the [facility]: (757)610-6769 call 911 if you are experiencing a Mental Health or Behavioral Health Crisis or need someone to talk to.  Jatia Musa L. Mcarthur Speedy, RN, BSN, CCM [place]  [facility], [facility] RN Care Manager Direct Dial: [phone]  Fax: [phone]

## 2023-10-19 ENCOUNTER — Inpatient Hospital Stay

## 2023-10-19 VITALS — BP 141/54 | HR 79 | Temp 97.8°F | Resp 20

## 2023-10-19 DIAGNOSIS — E611 Iron deficiency: Secondary | ICD-10-CM

## 2023-10-19 DIAGNOSIS — I5032 Chronic diastolic (congestive) heart failure: Secondary | ICD-10-CM | POA: Diagnosis not present

## 2023-10-19 DIAGNOSIS — D509 Iron deficiency anemia, unspecified: Secondary | ICD-10-CM | POA: Diagnosis not present

## 2023-10-19 DIAGNOSIS — N1831 Chronic kidney disease, stage 3a: Secondary | ICD-10-CM | POA: Diagnosis not present

## 2023-10-19 DIAGNOSIS — I4891 Unspecified atrial fibrillation: Secondary | ICD-10-CM | POA: Diagnosis not present

## 2023-10-19 DIAGNOSIS — G4730 Sleep apnea, unspecified: Secondary | ICD-10-CM | POA: Diagnosis not present

## 2023-10-19 DIAGNOSIS — D508 Other iron deficiency anemias: Secondary | ICD-10-CM

## 2023-10-19 DIAGNOSIS — D7589 Other specified diseases of blood and blood-forming organs: Secondary | ICD-10-CM | POA: Diagnosis not present

## 2023-10-19 DIAGNOSIS — I130 Hypertensive heart and chronic kidney disease with heart failure and stage 1 through stage 4 chronic kidney disease, or unspecified chronic kidney disease: Secondary | ICD-10-CM | POA: Diagnosis not present

## 2023-10-19 DIAGNOSIS — D539 Nutritional anemia, unspecified: Secondary | ICD-10-CM | POA: Diagnosis not present

## 2023-10-19 LAB — COPPER, SERUM: Copper: 108 ug/dL (ref 80–158)

## 2023-10-19 LAB — IMMUNOFIXATION ELECTROPHORESIS
IgA: 216 mg/dL (ref 64–422)
IgG (Immunoglobin G), Serum: 1065 mg/dL (ref 586–1602)
IgM (Immunoglobulin M), Srm: 80 mg/dL (ref 26–217)
Total Protein ELP: 6.3 g/dL (ref 6.0–8.5)

## 2023-10-19 LAB — METHYLMALONIC ACID, SERUM: Methylmalonic Acid, Quantitative: 419 nmol/L — ABNORMAL HIGH (ref 0–378)

## 2023-10-19 MED ORDER — SODIUM CHLORIDE 0.9 % IV SOLN: Freq: Once | INTRAVENOUS | Status: AC

## 2023-10-19 MED ORDER — SODIUM CHLORIDE 0.9 % IV SOLN
950.0000 mg | Freq: Once | INTRAVENOUS | Status: AC
  Administered 2023-10-19: 950 mg via INTRAVENOUS
  Filled 2023-10-19: qty 19

## 2023-10-19 MED ORDER — CETIRIZINE HCL 10 MG/ML IV SOLN
10.0000 mg | Freq: Once | INTRAVENOUS | Status: AC
  Administered 2023-10-19: 10 mg via INTRAVENOUS
  Filled 2023-10-19: qty 1

## 2023-10-19 MED ORDER — METHYLPREDNISOLONE SODIUM SUCC 125 MG IJ SOLR
125.0000 mg | Freq: Once | INTRAMUSCULAR | Status: AC
  Administered 2023-10-19: 125 mg via INTRAVENOUS
  Filled 2023-10-19: qty 2

## 2023-10-19 MED ORDER — SODIUM CHLORIDE 0.9 % IV SOLN
50.0000 mg | Freq: Once | INTRAVENOUS | Status: AC
  Administered 2023-10-19: 50 mg via INTRAVENOUS
  Filled 2023-10-19: qty 1

## 2023-10-19 MED ORDER — ACETAMINOPHEN 325 MG PO TABS
650.0000 mg | ORAL_TABLET | Freq: Once | ORAL | Status: DC
  Filled 2023-10-19: qty 2

## 2023-10-19 MED ORDER — FAMOTIDINE IN NACL 20-0.9 MG/50ML-% IV SOLN
20.0000 mg | Freq: Once | INTRAVENOUS | Status: AC
  Administered 2023-10-19: 20 mg via INTRAVENOUS
  Filled 2023-10-19: qty 50

## 2023-10-19 NOTE — Progress Notes (Signed)
 Stable during given today per MD orders.  Tolerated infusion without adverse affects.  Vital signs stable.  No complaints at this time.  Discharge from clinic ambulatory in stable condition.  Alert and oriented X 3.  Follow up with [facility] as scheduled.

## 2023-10-19 NOTE — Patient Instructions (Addendum)
 [facility] [place] - A DEPT OF [facility]  Discharge Instructions: Thank you for choosing [facility] to provide your oncology and hematology care.  If you have a lab appointment with the Cancer Center - please note that after April 8th, 2024, all labs will be drawn in the cancer center.  You do not have to check in or register with the main entrance as you have in the past but will complete your check-in in the cancer center.  Wear comfortable clothing and clothing appropriate for easy access to any Portacath or PICC line.   We strive to give you quality time with your provider. You may need to reschedule your appointment if you arrive late (15 or more minutes).  Arriving late affects you and other patients whose appointments are after yours.  Also, if you miss three or more appointments without notifying the office, you may be dismissed from the clinic at the provider's discretion.      For prescription refill requests, have your pharmacy contact our office and allow 72 hours for refills to be completed.    Today you received the following chemotherapy and/or immunotherapy agents Infed      To help prevent nausea and vomiting after your treatment, we encourage you to take your nausea medication as directed.  BELOW ARE SYMPTOMS THAT SHOULD BE REPORTED IMMEDIATELY: *FEVER GREATER THAN 100.4 F (38 C) OR HIGHER *CHILLS OR SWEATING *NAUSEA AND VOMITING THAT IS NOT CONTROLLED WITH YOUR NAUSEA MEDICATION *UNUSUAL SHORTNESS OF BREATH *UNUSUAL BRUISING OR BLEEDING *URINARY PROBLEMS (pain or burning when urinating, or frequent urination) *BOWEL PROBLEMS (unusual diarrhea, constipation, pain near the anus) TENDERNESS IN MOUTH AND THROAT WITH OR WITHOUT PRESENCE OF ULCERS (sore throat, sores in mouth, or a toothache) UNUSUAL RASH, SWELLING OR PAIN  UNUSUAL VAGINAL DISCHARGE OR ITCHING   Items with * indicate a potential emergency and should be followed up as  soon as possible or go to the Emergency Department if any problems should occur.  Please show the CHEMOTHERAPY ALERT CARD or IMMUNOTHERAPY ALERT CARD at check-in to the Emergency Department and triage nurse.  Should you have questions after your visit or need to cancel or reschedule your appointment, please contact [facility] [place] - A DEPT OF [facility] [phone]  and follow the prompts.  Office hours are 8:00 a.m. to 4:30 p.m. Monday - Friday. Please note that voicemails left after 4:00 p.m. may not be returned until the following business day.  We are closed weekends and major holidays. You have access to a nurse at all times for urgent questions. Please call the main number to the clinic [phone] and follow the prompts.  For any non-urgent questions, you may also contact your provider using MyChart. We now offer e-Visits for anyone 87 and older to request care online for non-urgent symptoms. For details visit [email or web address].   Also download the MyChart app! Go to the app store, search MyChart, open the app, select [place], and log in with your MyChart username and password.    Iron Dextran Injection What is this medication? IRON DEXTRAN (EYE ern DEX tran) treats low levels of iron in your body. Iron is a mineral that plays an important role in making red blood cells, which carry oxygen  from your lungs to the rest of your body. This medicine may be used for other purposes; ask your health care provider or pharmacist if you have questions.  COMMON BRAND NAME(S): Dexferrum, INFeD What should I tell my care team before I take this medication? They need to know if you have any of these conditions: Anemia not caused by low iron levels Heart disease High levels of iron in the blood Kidney disease Liver disease An unusual or allergic reaction to iron, other medications, foods, dyes, or preservatives Pregnant or trying to get  pregnant Breastfeeding How should I use this medication? This medication is injected into a vein or a muscle. It is given by your care team in a hospital or clinic setting. Talk to your care team about the use of this medication in children. While it may be prescribed for children as young as 4 months for selected conditions, precautions do apply. Overdosage: If you think you have taken too much of this medicine contact a poison control center or emergency room at once. NOTE: This medicine is only for you. Do not share this medicine with others. What if I miss a dose? Keep appointments for follow-up doses. It is important not to miss your dose. Call your care team if you are unable to keep an appointment. What may interact with this medication? Do not take this medication with any of the following: Deferoxamine Dimercaprol Other iron products This medication may also interact with the following: Chloramphenicol Deferasirox This list may not describe all possible interactions. Give your health care provider a list of all the medicines, herbs, non-prescription drugs, or dietary supplements you use. Also tell them if you smoke, drink alcohol, or use illegal drugs. Some items may interact with your medicine. What should I watch for while using this medication? Visit your care team for regular checks on your progress. Tell your care team if your symptoms do not start to get better or if they get worse. You may need blood work while taking this medication. You may need to eat more foods that contain iron. Talk to your care team. Foods that contain iron include whole grains/cereals, dried fruits, beans, peas, leafy green vegetables, and organ meats (liver, kidney). Long-term use of this medication may increase your risk of some cancers. Talk to your care team about your risk of cancer. What side effects may I notice from receiving this medication? Side effects that you should report to your care  team as soon as possible: Allergic reactions--skin rash, itching, hives, swelling of the face, lips, tongue, or throat Low blood pressure--dizziness, feeling faint or lightheaded, blurry vision Shortness of breath Side effects that usually do not require medical attention (report to your care team if they continue or are bothersome): Flushing Headache Joint pain Muscle pain Nausea Pain, redness, or irritation at injection site This list may not describe all possible side effects. Call your doctor for medical advice about side effects. You may report side effects to FDA at 1-800-FDA-1088. Where should I keep my medication? This medication is given in a hospital or clinic. It will not be stored at home. NOTE: This sheet is a summary. It may not cover all possible information. If you have questions about this medicine, talk to your doctor, pharmacist, or health care provider.  2024 Elsevier/Gold Standard (2022-12-06 00:00:00)

## 2023-10-19 NOTE — Progress Notes (Signed)
 Patient presents today for iron infusion.  Patient is in satisfactory condition with no new complaints voiced.  Vital signs are stable.  IV placed in R arm.  IV flushed well with good blood return noted.  Patient had two arthritis strength Tylenol  (1300 mg) prior to visit.  We will hold Tylenol  pre-medication today.  We will proceed with infusion per provider orders.

## 2023-10-25 ENCOUNTER — Encounter (INDEPENDENT_AMBULATORY_CARE_PROVIDER_SITE_OTHER): Payer: Self-pay | Admitting: *Deleted

## 2023-10-31 ENCOUNTER — Ambulatory Visit (INDEPENDENT_AMBULATORY_CARE_PROVIDER_SITE_OTHER): Admitting: Gastroenterology

## 2023-10-31 ENCOUNTER — Telehealth (INDEPENDENT_AMBULATORY_CARE_PROVIDER_SITE_OTHER): Payer: Self-pay | Admitting: Gastroenterology

## 2023-10-31 ENCOUNTER — Encounter (INDEPENDENT_AMBULATORY_CARE_PROVIDER_SITE_OTHER): Payer: Self-pay | Admitting: Gastroenterology

## 2023-10-31 VITALS — BP 109/61 | HR 78 | Temp 98.3°F | Ht 66.0 in | Wt 268.8 lb

## 2023-10-31 DIAGNOSIS — K5900 Constipation, unspecified: Secondary | ICD-10-CM | POA: Diagnosis not present

## 2023-10-31 DIAGNOSIS — D509 Iron deficiency anemia, unspecified: Secondary | ICD-10-CM | POA: Diagnosis not present

## 2023-10-31 DIAGNOSIS — Z6841 Body Mass Index (BMI) 40.0 and over, adult: Secondary | ICD-10-CM | POA: Insufficient documentation

## 2023-10-31 DIAGNOSIS — K5904 Chronic idiopathic constipation: Secondary | ICD-10-CM | POA: Insufficient documentation

## 2023-10-31 DIAGNOSIS — D500 Iron deficiency anemia secondary to blood loss (chronic): Secondary | ICD-10-CM

## 2023-10-31 MED ORDER — POLYETHYLENE GLYCOL 3350 17 GM/SCOOP PO POWD: 8.5000 g | Freq: Every day | ORAL | Status: AC

## 2023-10-31 NOTE — Telephone Encounter (Signed)
    10/31/23  Cate Oravec [facility] 12-May-1940  What type of surgery is being performed? EGD/Colonoscopy  When is surgery scheduled? TBD  What type of clearance is required (medical or pharmacy to hold medication or both?Cardiology and Pulmonology Clearance  Are there any medications that need to be held prior to surgery and how long? none  Name of physician performing surgery?  Dr. Cinderella Rouse [facility] at [facility] Phone: [phone] Fax: [phone]  Anethesia type (none, local, MAC, general)? MAC

## 2023-10-31 NOTE — Progress Notes (Signed)
 Vangie Henthorn Faizan Tenya Araque , M.D. Gastroenterology & Hepatology [facility] Gastroenterology [address] [place], [place] [address] Primary Care Physician: [name], MD [address] [place] [address]  Chief Complaint: Iron  deficiency anemia, constipation  History of Present Illness: Dawn Gonzales is a 83 y.o. female with multiple medical problems including coronary artery disease history of CVA, status post aortic valve replacement obesity history of cholecystitis treated with cholecystostomy on 2 occasions status post ERCP with sphincterotomy and stone extraction in October 2019 , Afib on eliquis  ( currently held because of acute anemia) , restrictive lung disease on continuous oxygen  (1.5L -4L)  who presents for evaluation of iron  deficiency anemia and constipation   Patient is a complain with daughter at the clinic [facility]) .  Patient reports her bowel movements are usually pebble-like small and hard 2-3 times daily.  Denies seeing black or stools of fresh blood.The patient denies having any nausea, vomiting, fever, chills, hematochezia, melena, hematemesis, abdominal distention, abdominal pain, diarrhea, jaundice, pruritus or weight loss.     Labs from 06/25 hemoglobin 8.7 baseline 11-12 MCV 106 Iron  saturation 7% ferritin 37  Last EGD:2019 during ERCP Last Colonoscopy: Patient states her last colonoscopy was Dr. Waymond of [facility], [place] with removal of few small polyps before 2018  FHx: smokes cigarettes about 10 years but quit in 1980  Social: neg smoking, alcohol or illicit drug use Surgical: no abdominal surgeries  Past Medical History: Past Medical History:  Diagnosis Date   Anemia    Anxiety    Arthritis    Bilateral renal cysts    Cholecystitis    Chronic diastolic heart failure (HCC)    Depression    Essential hypertension    History of cellulitis    History of CVA (cerebrovascular accident)    History of endocarditis     Hyperlipemia    Morbid obesity (HCC)    Neuropathy    Right bundle branch block (RBBB)    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   26 mm Edwards Sapien 3 transcatheter heart valve placed via percutaneous right transfemoral approach    Severe aortic stenosis    Sleep apnea     Past Surgical History: Past Surgical History:  Procedure Laterality Date   Abdominal gangrene     ADENOIDECTOMY     Cataract surgery     COLONOSCOPY     ENDOSCOPIC RETROGRADE CHOLANGIOPANCREATOGRAPHY (ERCP) WITH PROPOFOL  N/A 02/08/2018   Procedure: ENDOSCOPIC RETROGRADE CHOLANGIOPANCREATOGRAPHY (ERCP) WITH PROPOFOL ;  Surgeon: Wilhelmenia Aloha Raddle., MD;  Location: [facility] ENDOSCOPY;  Service: Gastroenterology;  Laterality: N/A;   EUS  02/08/2018   Procedure: UPPER ENDOSCOPIC ULTRASOUND (EUS) LINEAR;  Surgeon: Wilhelmenia Aloha Raddle., MD;  Location: [facility] ENDOSCOPY;  Service: Gastroenterology;;   EYE SURGERY     IR FLUORO RM 30-60 MIN  03/27/2018   IR PERC CHOLECYSTOSTOMY  02/09/2018   IR RADIOLOGIST EVAL & MGMT  03/26/2018   IR RADIOLOGY PERIPHERAL GUIDED IV START  05/22/2018   IR RADIOLOGY PERIPHERAL GUIDED IV START  05/30/2018   IR RADIOLOGY PERIPHERAL GUIDED IV START  06/05/2018   IR US  GUIDE VASC ACCESS RIGHT  05/22/2018   IR US  GUIDE VASC ACCESS RIGHT  05/30/2018   IR US  GUIDE VASC ACCESS RIGHT  06/05/2018   PACEMAKER IMPLANT N/A 07/10/2018   Procedure: PACEMAKER IMPLANT;  Surgeon: Waddell Danelle ORN, MD;  Location: [facility] INVASIVE CV LAB;  Service: Cardiovascular;  Laterality: N/A;   REMOVAL OF  STONES  02/08/2018   Procedure: REMOVAL OF STONES;  Surgeon: Wilhelmenia Aloha Raddle., MD;  Location: [facility] ENDOSCOPY;  Service: Gastroenterology;;   ANNETT  02/08/2018   Procedure: ANNETT;  Surgeon: Mansouraty, Aloha Raddle., MD;  Location: [facility] ENDOSCOPY;  Service: Gastroenterology;;   TONSILLECTOMY     TOOTH EXTRACTION N/A 06/18/2018   Procedure: DENTAL RESTORATION/EXTRACTIONS;  Surgeon: Sheryle Hamilton,  DDS;  Location: [facility] OR;  Service: Oral Surgery;  Laterality: N/A;   TRANSCATHETER AORTIC VALVE REPLACEMENT, TRANSFEMORAL N/A 07/09/2018   Procedure: TRANSCATHETER AORTIC VALVE REPLACEMENT, TRANSFEMORAL;  Surgeon: Wonda Sharper, MD;  [facility];  Service: Cardiovascular;  Laterality: N/A;   Tummy tuck      Family History: Family History  Problem Relation Age of Onset   Cancer Mother    Hypertension Father    Arthritis Father    Heart attack Father     Social History: Social History   Tobacco Use  Smoking Status Former   Current packs/day: 0.00   Types: Cigarettes   Quit date: 05/23/1978   Years since quitting: 45.4   Passive exposure: Never  Smokeless Tobacco Never   Social History   Substance and Sexual Activity  Alcohol Use Not Currently   Comment: Occasional   Social History   Substance and Sexual Activity  Drug Use Never    Allergies: No Known Allergies  Medications: Current Outpatient Medications  Medication Sig Dispense Refill   acetaminophen  (TYLENOL ) 325 MG tablet Take 2 tablets (650 mg total) by mouth every 6 (six) hours as needed for mild pain, fever or headache (or Fever >/= 101). 12 tablet 0   albuterol  (VENTOLIN  HFA) 108 (90 Base) MCG/ACT inhaler Inhale 2 puffs every 4 hours by inhalation route.     Calcium  Carbonate (CALCIUM -CARB 600 PO) Take 600 mg by mouth daily.     cholecalciferol  (VITAMIN D ) 1000 units tablet Take 1,000 Units by mouth daily.     clotrimazole-betamethasone  (LOTRISONE) cream Apply 1 Application topically.     dextromethorphan -guaiFENesin  (MUCINEX  DM) 30-600 MG 12hr tablet Take 1 tablet by mouth 2 (two) times daily. 20 tablet 0   ferrous sulfate  325 (65 FE) MG tablet Take 325 mg by mouth daily with breakfast.     gabapentin  (NEURONTIN ) 300 MG capsule Take 300 mg by mouth 2 (two) times daily.     ipratropium-albuterol  (DUONEB) 0.5-2.5 (3) MG/3ML SOLN Take 3 mLs by nebulization 2 (two) times daily. 360 mL 0    metoprolol  tartrate 37.5 MG TABS Take 1 tablet (37.5 mg total) by mouth 2 (two) times daily. 60 tablet 1   Multiple Vitamin (MULTIVITAMIN WITH MINERALS) TABS tablet Take 1 tablet by mouth daily.     nystatin  (MYCOSTATIN /NYSTOP ) powder APPLY TO AFFECTED AREA TWICE A DAY     nystatin  (MYCOSTATIN /NYSTOP ) powder Apply topically 2 (two) times daily.     potassium chloride  (KLOR-CON ) 10 MEQ tablet Take 1 tablet (10 mEq total) by mouth daily. Take While taking Demadex /Torsemide  30 tablet 2   silver sulfADIAZINE (SILVADENE) 1 % cream Apply 1 Application topically daily as needed (wound).     torsemide  (DEMADEX ) 20 MG tablet TAKE 2 TABLETS BY MOUTH TWICE A DAY 360 tablet 0   Turmeric (QC TUMERIC COMPLEX) 500 MG CAPS Take 1 capsule by mouth at bedtime.     venlafaxine  (EFFEXOR ) 37.5 MG tablet Take 37.5 mg by mouth daily.     Vibegron  (GEMTESA ) 75 MG TABS Take 1 tablet (75 mg total) by mouth daily. 30 tablet 11  vitamin C  (ASCORBIC ACID ) 500 MG tablet Take 500 mg by mouth daily.     apixaban  (ELIQUIS ) 5 MG TABS tablet Take 1 tablet (5 mg total) by mouth 2 (two) times daily. (Patient not taking: Reported on 10/31/2023) 60 tablet 6   OXYGEN  Inhale 3 L into the lungs. With long tube mostly 1-1.5     pravastatin  (PRAVACHOL ) 20 MG tablet Take 20 mg by mouth daily.     No current facility-administered medications for this visit.    Review of Systems: GENERAL: negative for malaise, night sweats HEENT: No changes in hearing or vision, no nose bleeds or other nasal problems. NECK: Negative for lumps, goiter, pain and significant neck swelling RESPIRATORY: Negative for cough, wheezing CARDIOVASCULAR: Negative for chest pain, leg swelling, palpitations, orthopnea GI: SEE HPI MUSCULOSKELETAL: Negative for joint pain or swelling, back pain, and muscle pain. SKIN: Negative for lesions, rash HEMATOLOGY Negative for prolonged bleeding, bruising easily, and swollen nodes. ENDOCRINE: Negative for cold or heat  intolerance, polyuria, polydipsia and goiter. NEURO: negative for tremor, gait imbalance, syncope and seizures. The remainder of the review of systems is noncontributory.   Physical Exam: BP 109/61   Pulse 78   Temp 98.3 F (36.8 C)   Ht 5' 6 (1.676 m)   Wt 268 lb 12.8 oz (121.9 kg)   BMI 43.39 kg/m  GENERAL: The patient is AO x3, in no acute distress. HEENT: Head is normocephalic and atraumatic. EOMI are intact. Mouth is well hydrated and without lesions. NECK: Supple. No masses LUNGS: Clear to auscultation. No presence of rhonchi/wheezing/rales. Adequate chest expansion HEART: RRR, normal s1 and s2. ABDOMEN: Soft, nontender, no guarding, no peritoneal signs, and nondistended. BS +. No masses.  Imaging/Labs: as above     Latest Ref Rng & Units 10/17/2023    9:09 AM 07/23/2023    5:13 AM 07/22/2023    3:48 AM  CBC  WBC 4.0 - 10.5 K/uL 5.7  7.0  7.1   Hemoglobin 12.0 - 15.0 g/dL 8.7  88.7  87.6   Hematocrit 36.0 - 46.0 % 29.6  36.4  39.5   Platelets 150 - 400 K/uL 257  258  250    Lab Results  Component Value Date   IRON  29 10/17/2023   TIBC 429 10/17/2023   FERRITIN 37 10/17/2023    I personally reviewed and interpreted the available labs, imaging and endoscopic files.  Impression and Plan:  MIKI LABUDA is a 83 y.o. female with multiple medical problems including coronary artery disease history of CVA, status post aortic valve replacement obesity history of cholecystitis treated with cholecystostomy on 2 occasions status post ERCP with sphincterotomy and stone extraction in October 2019 , Afib on eliquis  ( currently held because of acute anemia) , restrictive lung disease on continuous oxygen  (1.5L -4L)  who presents for evaluation of iron  deficiency anemia and constipation   #Iron  deficiency anemia  Labs from 06/25 hemoglobin 8.7 baseline 11-12 MCV 106 Iron  saturation 7% ferritin 37  Patient has new onset iron  deficiency anemia in setting of  anticoagulation   Use due to paroxysmal A-fib.  This could be angiectasia, polyps will need to rule out malignancy given advanced age  Last upper endoscopy 2019 and colonoscopy at outside institution before 2018  As per ACG guidelines , bidirectional endoscopy is recommended over iron  replacement therapy only.  I have discussed with patient and daughter that even though the procedure is low risk patient is high risk for intraprocedural adverse effects  due to anesthesia given cardiopulmonary comorbidities  Patient has clinical indication anticoagulation and with acute anemia bidirectional endoscopy is indicated  We will proceed along with Upper endoscopy with small bowel biopsies and Colonoscopy   I thoroughly discussed with the patient the procedure, including the risks involved. Patient understands what the procedure involves including the benefits and any risks. Patient understands alternatives to the proposed procedure. Risks including (but not limited to) bleeding, tearing of the lining (perforation), rupture of adjacent organs, problems with heart and lung function, infection, and medication reactions. A small percentage of complications may require surgery, hospitalization, repeat endoscopic procedure, and/or transfusion.  Patient understood and agreed.   If above does not explain patient iron  deficiency anemia may pursue small bowel capsule in future  #Constipation Ensure adequate fluid intake: Aim for 8 glasses of water daily. Follow a high fiber diet: Include foods such as dates, prunes, pears, and kiwi. Take Miralax  twice a day for the first week, then reduce to once daily thereafter. Use Metamucil twice a day.   All questions were answered.      Betzaida Cremeens Faizan Lodema Parma, MD Gastroenterology and Hepatology [facility] Gastroenterology   This chart has been completed using [facility] Dictation software, and while attempts have been made to ensure accuracy ,  certain words and phrases may not be transcribed as intended

## 2023-10-31 NOTE — Patient Instructions (Signed)
 It was very nice to meet you today, as dicussed with will plan for the following :  1) upper endoscopy and colonoscopy after cardiology/pulomonary clearance

## 2023-10-31 NOTE — Telephone Encounter (Signed)
Pharmacy please advise on holding Eliquis prior to EDG scheduled for TBD. Thank you.

## 2023-11-01 NOTE — Telephone Encounter (Signed)
 Patient with diagnosis of aflutter on Eliquis  for anticoagulation.    Procedure: EGD/Colonoscopy  Date of procedure: TBD   CHA2DS2-VASc Score = 8   This indicates a 10.8% annual risk of stroke. The patient's score is based upon: CHF History: 1 HTN History: 1 Diabetes History: 0 Stroke History: 2 Vascular Disease History: 1 Age Score: 2 Gender Score: 1      CrCl 67 ml/min Platelet count 257  Patient has not had an Afib/aflutter ablation within the last 3 months or DCCV within the last 30 days  Patient's Eliquis  is currently on hold due to anemia. I doubt it will be resumed prior to EGD/Colonoscopy, but if it is, ok to patient to hold Eliquis  for 2 days prior to procedure.  **This guidance is not considered finalized until pre-operative APP has relayed final recommendations.**

## 2023-11-01 NOTE — Telephone Encounter (Signed)
   Name: Dawn Gonzales  DOB: Apr 20, 1941  MRN: [id number]  Primary Cardiologist: Jayson Sierras, MD  Chart reviewed as part of pre-operative protocol coverage. The patient has an upcoming visit scheduled with Dr. Sierras on 11/19/2023 at which time clearance can be addressed in case there are any issues that would impact surgical recommendations.  EGD/colonoscopy is not scheduled until TBD as below. I added preop FYI to appointment note so that provider is aware to address at time of outpatient visit.  Per office protocol the cardiology provider should forward their finalized clearance decision and recommendations regarding antiplatelet therapy to the requesting party below.    Patient has not had an Afib/aflutter ablation within the last 3 months or DCCV within the last 30 days   Patient's Eliquis  is currently on hold due to anemia. I doubt it will be resumed prior to EGD/Colonoscopy, but if it is, ok to patient to hold Eliquis  for 2 days prior to procedure.  I will route this message as FYI to requesting party and remove this message from the preop box as separate preop APP input not needed at this time.   Please call with any questions.  [name], NP  11/01/2023, 8:53 AM

## 2023-11-03 DIAGNOSIS — G4733 Obstructive sleep apnea (adult) (pediatric): Secondary | ICD-10-CM | POA: Diagnosis not present

## 2023-11-03 DIAGNOSIS — G44009 Cluster headache syndrome, unspecified, not intractable: Secondary | ICD-10-CM | POA: Diagnosis not present

## 2023-11-07 ENCOUNTER — Encounter (INDEPENDENT_AMBULATORY_CARE_PROVIDER_SITE_OTHER): Payer: Self-pay | Admitting: Gastroenterology

## 2023-11-14 DIAGNOSIS — G4733 Obstructive sleep apnea (adult) (pediatric): Secondary | ICD-10-CM | POA: Diagnosis not present

## 2023-11-14 DIAGNOSIS — G4730 Sleep apnea, unspecified: Secondary | ICD-10-CM | POA: Diagnosis not present

## 2023-11-19 ENCOUNTER — Ambulatory Visit: Attending: Cardiology | Admitting: Cardiology

## 2023-11-19 ENCOUNTER — Encounter: Payer: Self-pay | Admitting: Cardiology

## 2023-11-19 VITALS — BP 122/64 | HR 61 | Ht 66.0 in | Wt 265.0 lb

## 2023-11-19 DIAGNOSIS — Z952 Presence of prosthetic heart valve: Secondary | ICD-10-CM | POA: Diagnosis not present

## 2023-11-19 DIAGNOSIS — Z01810 Encounter for preprocedural cardiovascular examination: Secondary | ICD-10-CM | POA: Diagnosis not present

## 2023-11-19 DIAGNOSIS — I5032 Chronic diastolic (congestive) heart failure: Secondary | ICD-10-CM | POA: Diagnosis not present

## 2023-11-19 DIAGNOSIS — I484 Atypical atrial flutter: Secondary | ICD-10-CM | POA: Diagnosis not present

## 2023-11-19 NOTE — Progress Notes (Signed)
 Cardiology Office Note  Date: 11/19/2023   ID: BRYAHNA LESKO, DOB Jan 24, 1941, MRN [id number]  History of Present Illness: Dawn Gonzales is an 83 y.o. female last seen in July 2024.  She is here with her daughter for a follow-up visit.  She is being considered for EGD and colonoscopy under MAC with Dr. Cinderella for evaluation of iron  deficiency anemia.  From a cardiac perspective, she has had no substantial weight gain or worsening leg edema on current diuretic regimen.  She had a follow-up echocardiogram in March which is noted below showing stable TAVR function and LVEF 55 to 60% as well as normal RV contraction.  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  Biotronik pacemaker in place with followed by Dr. Waddell.  Device interrogation in June revealed normal function.  She has had intermittent atrial tachycardia and atypical atrial flutter and has been on Eliquis  for stroke prophylaxis with CHA2DS2-VASc score of 7.  Anticoagulation has been on hold in light of current anemia workup.  We went over her medications.  She continues on stable dose of Demadex  at 40 mg twice daily with potassium supplement.  Physical Exam: VS:  BP 122/64   Pulse 61   Ht 5' 6 (1.676 m)   Wt 265 lb (120.2 kg)   SpO2 93%   BMI 42.77 kg/m , BMI Body mass index is 42.77 kg/m.  Wt Readings from Last 3 Encounters:  11/19/23 265 lb (120.2 kg)  10/31/23 268 lb 12.8 oz (121.9 kg)  10/17/23 260 lb (117.9 kg)    General: Patient appears comfortable at rest.  In wheelchair today and wearing oxygen  via nasal cannula. HEENT: Conjunctiva and lids normal. Neck: Supple, no elevated JVP or carotid bruits. Lungs: Decreased breath sounds, nonlabored breathing at rest. Cardiac: Regular rate and rhythm, no S3, 2/6 systolic murmur. Extremities: Chronic appearing edema and venous stasis.  ECG:  An ECG dated 08/28/2023 was personally  reviewed today and demonstrated:  Sinus rhythm with right bundle branch block and leftward axis, nonspecific T wave changes.  Labwork: 07/21/2023: ALT 19; AST 23; TSH 3.994 07/23/2023: BUN 26; Creatinine, Ser 0.84; Magnesium  2.6; Potassium 4.2; Sodium 138 10/17/2023: Hemoglobin 8.7; Platelets 257   Other Studies Reviewed Today:  Echocardiogram 07/22/2023:  1. Left ventricular ejection fraction, by estimation, is 55 to 60%. The  left ventricle has normal function. Left ventricular endocardial border  not optimally defined to evaluate regional wall motion. There is moderate  left ventricular hypertrophy.  Indeterminate diastolic filling due to E-A fusion.   2. Right ventricular systolic function is normal. The right ventricular  size is normal.   3. Left atrial size was severely dilated.   4. The mitral valve is normal in structure. Mild mitral valve  regurgitation. Mild to moderate mitral stenosis in the setting of  tachycardia. The mean mitral valve gradient is 7.0 mmHg. Severe mitral  annular calcification.   5. The aortic valve has been repaired/replaced. Aortic valve  regurgitation is trivial. There is a 26 mm Sapien prosthetic (TAVR) valve  present in the aortic position. Aortic valve mean gradient measures 7.7  mmHg.   6. The inferior vena cava is dilated in size with >50% respiratory  variability, suggesting right atrial pressure of 8 mmHg.   7. Increased flow velocities may be secondary to anemia, thyrotoxicosis,  hyperdynamic or high flow state.   Assessment and Plan:  1.  History of aortic stenosis status  post 26 mm Edwards SAPIEN 3 TAVR in March 2020.  Echocardiogram March revealed stable prosthetic function with mean AV gradient 7.7 mmHg and trivial aortic regurgitation.   2.  Primary hypertension.  Blood pressure is well-controlled today.  Currently on Lopressor  37.5 mg twice daily.   3.  Mixed hyperlipidemia.  Continue Pravachol  20 mg daily.   4.  HFpEF, LVEF 55 to 60%  and RV contraction normal by echocardiogram in March.  Weight has been stable over the last few months.  She is on Demadex  40 mg twice daily with potassium supplement.  Not good candidate for SGLT2 inhibitor given limited mobility and increased infection risk.  5.  Paroxysmal atypical atrial flutter with CHA2DS2-VASc score of 7, evident based on device interrogation.  Switched from aspirin  to Eliquis , although anticoagulation presently on hold in the setting of workup for iron  deficiency anemia.  6.  Periprocedural cardiac assessment.  Patient being considered for EGD and colonoscopy potentially under MAC by Dr. Cinderella.  She has been stable from the perspective of HFpEF and TAVR function normal as of echocardiogram in March.  Does have chronic hypoxic/hypercarbic respiratory failure which increases her pulmonary risk with sedation.  Overall intermediate risk situation however and she should be able to proceed without further cardiac testing.  Eliquis  is already on hold.  Disposition:  Follow up 4 months.  Signed, Jayson JUDITHANN Sierras, M.D., F.A.C.C. [facility]

## 2023-11-19 NOTE — Patient Instructions (Addendum)
 Medication Instructions:   Your physician recommends that you continue on your current medications as directed. Please refer to the Current Medication list given to you today.  Labwork:  none  Testing/Procedures:  none  Follow-Up:  Your physician recommends that you schedule a follow-up appointment in: 4 months.   Any Other Special Instructions Will Be Listed Below (If Applicable).  If you need a refill on your cardiac medications before your next appointment, please call your pharmacy.

## 2023-11-20 ENCOUNTER — Telehealth: Payer: Self-pay | Admitting: *Deleted

## 2023-11-20 NOTE — Telephone Encounter (Signed)
 6.  Periprocedural cardiac assessment.  Patient being considered for EGD and colonoscopy potentially under MAC by Dr. Cinderella.  She has been stable from the perspective of HFpEF and TAVR function normal as of echocardiogram in March.  Does have chronic hypoxic/hypercarbic respiratory failure which increases her pulmonary risk with sedation.  Overall intermediate risk situation however and she should be able to proceed without further cardiac testing.  Eliquis  is already on hold.   Disposition:  Follow up 4 months.

## 2023-11-20 NOTE — Telephone Encounter (Signed)
 Requesting Pulmonary Clearance  11/20/23  Dawn Gonzales [facility] [birth year]  What type of surgery is being performed? COLONOSCOPY/EGD  When is surgery scheduled? TBD  Name of physician performing surgery?  Dr. Cinderella Rouse Gastroenterology at [facility] Phone: [phone] Fax: [phone]  Anethesia type (none, local, MAC, general)? MAC

## 2023-11-22 ENCOUNTER — Inpatient Hospital Stay: Attending: Hematology

## 2023-11-22 DIAGNOSIS — Z79899 Other long term (current) drug therapy: Secondary | ICD-10-CM | POA: Diagnosis not present

## 2023-11-22 DIAGNOSIS — D509 Iron deficiency anemia, unspecified: Secondary | ICD-10-CM | POA: Diagnosis not present

## 2023-11-22 DIAGNOSIS — D508 Other iron deficiency anemias: Secondary | ICD-10-CM

## 2023-11-22 LAB — CBC
HCT: 38.3 % (ref 36.0–46.0)
Hemoglobin: 11.3 g/dL — ABNORMAL LOW (ref 12.0–15.0)
MCH: 30.1 pg (ref 26.0–34.0)
MCHC: 29.5 g/dL — ABNORMAL LOW (ref 30.0–36.0)
MCV: 102.1 fL — ABNORMAL HIGH (ref 80.0–100.0)
Platelets: 212 K/uL (ref 150–400)
RBC: 3.75 MIL/uL — ABNORMAL LOW (ref 3.87–5.11)
RDW: 13.6 % (ref 11.5–15.5)
WBC: 6.9 K/uL (ref 4.0–10.5)
nRBC: 0 % (ref 0.0–0.2)

## 2023-11-22 LAB — IRON AND TIBC
Iron: 30 ug/dL (ref 28–170)
Saturation Ratios: 9 % — ABNORMAL LOW (ref 10.4–31.8)
TIBC: 348 ug/dL (ref 250–450)
UIBC: 318 ug/dL

## 2023-11-22 LAB — FERRITIN: Ferritin: 112 ng/mL (ref 11–307)

## 2023-11-22 NOTE — Progress Notes (Signed)
 Remote pacemaker transmission.

## 2023-11-22 NOTE — Addendum Note (Signed)
 Addended by: TAWNI DRILLING D on: 11/22/2023 10:24 AM   Modules accepted: Orders

## 2023-11-23 NOTE — Telephone Encounter (Signed)
 Admin, will you please get this appt set up? Thanks so much!  Per Dr. Jude: Please schedule a FU viist with APP Charley for clearance

## 2023-11-23 NOTE — Telephone Encounter (Signed)
 Patient is scheduled 8/6 with Candis at [facility]. NFN.

## 2023-11-26 MED ORDER — PEG 3350-KCL-NA BICARB-NACL 420 G PO SOLR: 4000.0000 mL | Freq: Once | ORAL | 0 refills | Status: AC

## 2023-11-26 NOTE — Addendum Note (Signed)
 Addended by: JEANELL GRAEME RAMAN on: 11/26/2023 10:47 AM   Modules accepted: Orders

## 2023-11-26 NOTE — Telephone Encounter (Signed)
 Can schedule now  , Room 3

## 2023-11-26 NOTE — Telephone Encounter (Addendum)
 Spoke with pt daughter Lauraine. Due to pt schedule and previously scheduled appointment, pt procedure scheduled for 8/28. Aware will send instructions via mychart, rx for prep to pharmacy, and call back with pre-op appt.    Per [employer]here. PA not required for TCS EGD approved via [employer]here Authorization #[id number] DOS:  11/30/2023 - 01/30/2024

## 2023-11-29 ENCOUNTER — Ambulatory Visit: Admitting: Oncology

## 2023-11-30 ENCOUNTER — Other Ambulatory Visit: Payer: Self-pay

## 2023-11-30 ENCOUNTER — Inpatient Hospital Stay: Attending: Oncology | Admitting: Oncology

## 2023-11-30 ENCOUNTER — Encounter: Payer: Self-pay | Admitting: *Deleted

## 2023-11-30 ENCOUNTER — Other Ambulatory Visit: Payer: Self-pay | Admitting: *Deleted

## 2023-11-30 VITALS — BP 134/65 | HR 60 | Temp 98.0°F | Resp 18

## 2023-11-30 DIAGNOSIS — I509 Heart failure, unspecified: Secondary | ICD-10-CM | POA: Diagnosis not present

## 2023-11-30 DIAGNOSIS — D649 Anemia, unspecified: Secondary | ICD-10-CM | POA: Insufficient documentation

## 2023-11-30 DIAGNOSIS — I130 Hypertensive heart and chronic kidney disease with heart failure and stage 1 through stage 4 chronic kidney disease, or unspecified chronic kidney disease: Secondary | ICD-10-CM | POA: Insufficient documentation

## 2023-11-30 DIAGNOSIS — G4730 Sleep apnea, unspecified: Secondary | ICD-10-CM | POA: Diagnosis not present

## 2023-11-30 DIAGNOSIS — D7589 Other specified diseases of blood and blood-forming organs: Secondary | ICD-10-CM | POA: Insufficient documentation

## 2023-11-30 DIAGNOSIS — N1831 Chronic kidney disease, stage 3a: Secondary | ICD-10-CM | POA: Insufficient documentation

## 2023-11-30 DIAGNOSIS — I4891 Unspecified atrial fibrillation: Secondary | ICD-10-CM | POA: Insufficient documentation

## 2023-11-30 DIAGNOSIS — G629 Polyneuropathy, unspecified: Secondary | ICD-10-CM | POA: Insufficient documentation

## 2023-11-30 DIAGNOSIS — Z79899 Other long term (current) drug therapy: Secondary | ICD-10-CM | POA: Insufficient documentation

## 2023-11-30 DIAGNOSIS — Z7901 Long term (current) use of anticoagulants: Secondary | ICD-10-CM | POA: Insufficient documentation

## 2023-11-30 DIAGNOSIS — D509 Iron deficiency anemia, unspecified: Secondary | ICD-10-CM | POA: Diagnosis present

## 2023-11-30 DIAGNOSIS — E785 Hyperlipidemia, unspecified: Secondary | ICD-10-CM | POA: Insufficient documentation

## 2023-11-30 DIAGNOSIS — E559 Vitamin D deficiency, unspecified: Secondary | ICD-10-CM | POA: Diagnosis not present

## 2023-11-30 NOTE — Assessment & Plan Note (Addendum)
-   Anemia most likely from blood loss from being on Eliquis .  Her hemoglobin was normal in March prior to starting Eliquis .  Macrocytosis likely from reticulocytosis. -She received 1 g of INFeD  on 10/19/2023. -Additional lab work from workup with Dr. Rogers showed iron  deficiency with iron  saturations of 7% and a ferritin of 37.  TIBC was elevated at 429.  SPEP was negative for M spike.  Immunofixation was unremarkable.  No evidence of hemolysis.  MMA was elevated at 419 and copper  levels were WNL. -She is scheduled for a colonoscopy/EGD at the end of the month. -She is able to tolerate oral iron  325 mg daily.  She is taking it in the middle of the day to avoid interactions with other medications.  She is taking this with orange juice. -Repeat labs from 11/22/2023 show a significant improvement in her iron  levels although iron  saturations are still a little bit low.  Her hemoglobin improved from 8.7-11.3.  Given she is able to tolerate oral iron , I would recommend taking that for now and we will hold off on additional IV iron .  Please let me know if you experience any additional bleeding per rectum.

## 2023-11-30 NOTE — Progress Notes (Signed)
 [facility] OFFICE PROGRESS NOTE  [name], MD  ASSESSMENT & PLAN:  Assessment & Plan Anemia, unspecified type - Anemia most likely from blood loss from being on Eliquis .  Her hemoglobin was normal in March prior to starting Eliquis .  Macrocytosis likely from reticulocytosis. -She received 1 g of INFeD  on 10/19/2023. -Additional lab work from workup with Dr. Rogers showed iron  deficiency with iron  saturations of 7% and a ferritin of 37.  TIBC was elevated at 429.  SPEP was negative for M spike.  Immunofixation was unremarkable.  No evidence of hemolysis.  MMA was elevated at 419 and copper  levels were WNL. -She is scheduled for a colonoscopy/EGD at the end of the month. -She is able to tolerate oral iron  325 mg daily.  She is taking it in the middle of the day to avoid interactions with other medications.  She is taking this with orange juice. -Repeat labs from 11/22/2023 show a significant improvement in her iron  levels although iron  saturations are still a little bit low.  Her hemoglobin improved from 8.7-11.3.  Given she is able to tolerate oral iron , I would recommend taking that for now and we will hold off on additional IV iron .  Please let me know if you experience any additional bleeding per rectum.   Orders Placed This Encounter  Procedures   CBC    Standing Status:   Future    Expected Date:   03/01/2024    Expiration Date:   05/30/2024   Iron  and TIBC ([facility] ONLY)    Standing Status:   Future    Expected Date:   03/01/2024    Expiration Date:   05/30/2024   Ferritin    Standing Status:   Future    Expected Date:   03/01/2024    Expiration Date:   05/30/2024   Methylmalonic acid, serum    Standing Status:   Future    Expected Date:   03/01/2024    Expiration Date:   05/30/2024   Vitamin B12    Standing Status:   Future    Expected Date:   03/01/2024    Expiration Date:   05/30/2024    INTERVAL HISTORY: Today, she states that she is  doing well overall. Her appetite level is at 100%. Her energy level is at 100%.  She is here today with her daughter.  Reports improvement of her energy following IV iron .  She denies any bleeding, hematochezia or melena.  She is scheduled for a colonoscopy at the end of the month.  Reports she is taking iron  tablets and tolerating them well.  Denies constipation.  Stools might be slightly darker than usual.  She is no longer taking Eliquis .  SUMMARY OF HEMATOLOGIC HISTORY:  Katlen is a 83 y.o. female presenting to clinic today for evaluation of anemia at the request of Hyacinth Honey, NP.  Patient has a medical history of CKD stage 3a, hypertension, CHF, A-fib, atherosclerosis of aorta, aneurysm of thoracic aorta, restrictive lung disease, sleep apnea, hyperlipidemia, vitamin D  deficiency, polyneuropathy, and idiopathic peripheral neuropathy.   Dalani was seen by [facility] NP on 10/09/23 for same day labs. His most recent CBC diff from 10/09/23 showed low RBC at 2.53, low HGB at 7.7, low HCT at 26.1, elevated MCV at 103, and elevated MCHC at 29.5. Folate from 07/21/23 was normal being over 40.0. Vitamin B12 from 07/21/23 was normal at 420.   [place] colonoscopy was [place] in [place] prior  to moving down in 2019.  She was started on Eliquis  on 08/01/2023 and hemoglobin dropped on 10/12/2023 to as low as 7.7.  She denied any bright red blood per rectum/melena.  No ice pica.  She has been on iron  tablets for years.  Review of Systems  Musculoskeletal:  Positive for joint pain.   Physical Exam Constitutional:      Appearance: Normal appearance. She is obese.  Cardiovascular:     Rate and Rhythm: Normal rate and regular rhythm.  Pulmonary:     Effort: Pulmonary effort is normal.     Breath sounds: Normal breath sounds.  Abdominal:     General: Bowel sounds are normal.     Palpations: Abdomen is soft.  Musculoskeletal:        General: No swelling. Normal range of motion.  Neurological:      Mental Status: She is alert and oriented to person, place, and time. Mental status is at baseline.      No results found for: CBC  Vitals:   11/30/23 1025  BP: 134/65  Pulse: 60  Resp: 18  Temp: 98 F (36.7 C)  SpO2: 91%    I spent 25 minutes dedicated to the care of this patient (face-to-face and non-face-to-face) on the date of the encounter to include what is described in the assessment and plan.,  Delon Hope, NP 11/30/2023 11:18 AM

## 2023-11-30 NOTE — Patient Outreach (Signed)
 Complex Care Management   Visit Note  11/30/2023  Name:  Dawn Gonzales MRN: [id number] DOB: [birth year]  Situation: Referral received for Complex Care Management related to Heart Failure I obtained verbal consent from Caregiver Patient.  Visit completed with Dawn Gonzales and daughter Dawn Gonzales  on the phone  Patient was seen today by oncology and her lab values have improved very much  She states she has a lot more energy- during her visit today it was noted that her blood loss was most likely from her use of Eliquis . Her hemoglobin improved from 8.7-11.3. Given she is able to tolerate oral iron , I would recommend taking that for now and we will hold off on additional IV iron .  Her rash under her breast has healed She is scheduled on 12/10/23 for a renal ultrasound ordered by Dr Sherrilee and wanted to make sure Dr Shona & Dawn Gonzales are aware Daughter states Dr Sherrilee is aware of the right renal mass and she will follow up with him on 12/19/23  Background:   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   26 mm Edwards Sapien 3 transcatheter heart valve placed via percutaneous right transfemoral approach    Severe aortic stenosis    Sleep apnea     Assessment: Patient Reported Symptoms:  Cognitive Cognitive Status: No symptoms reported, Alert and oriented to person, place, and time      Neurological Neurological Review of Symptoms: No symptoms reported Neurological Self-Management Outcome: 4 (good)  HEENT HEENT Symptoms Reported: No symptoms reported HEENT Self-Management  Outcome: 4 (good)    Cardiovascular Cardiovascular Symptoms Reported: No symptoms reported Other Cardiovascular Symptoms: fatigue has greatly improved Cardiovascular Self-Management Outcome: 5 (very good)  Respiratory Respiratory Symptoms Reported: No symptoms reported Respiratory Self-Management Outcome: 4 (good)  Endocrine Endocrine Symptoms Reported: No symptoms reported Endocrine Self-Management Outcome: 4 (good)  Gastrointestinal Gastrointestinal Symptoms Reported: No symptoms reported Gastrointestinal Self-Management Outcome: 4 (good) Nutrition Risk Screen (CP): No indicators present  Genitourinary Genitourinary Symptoms Reported: No symptoms reported Genitourinary Self-Management Outcome: 4 (good)  Integumentary Integumentary Symptoms Reported: No symptoms reported Additional Integumentary Details: yeast rash under breast is now all resolved Skin Management Strategies: Medication therapy, Adequate rest, Routine screening Skin Self-Management Outcome: 5 (very good) Skin Comment: history of skin lesions, cellulitis, pressure injury of buttocks, candidiasis of skin deep tissue injury history  Musculoskeletal Musculoskelatal Symptoms Reviewed: No symptoms reported Musculoskeletal Self-Management Outcome: 4 (good)      Psychosocial Psychosocial Symptoms Reported: No symptoms reported Behavioral Health Self-Management Outcome: 5 (very good)          11/30/2023   11:26 AM  Depression screen PHQ 2/9  Decreased Interest 0  Down, Depressed, Hopeless 0  PHQ - 2 Score 0    There were no vitals filed for this visit.  Medications Reviewed Today     Reviewed by Ramonita Suzen CROME, RN (Registered Nurse) on 11/30/23 at 1220  Med List Status: <None>   Medication Order Taking? Sig  Documenting Provider Last Dose Status Informant  acetaminophen  (TYLENOL ) 325 MG tablet [id number]  Take 2 tablets (650 mg total) by mouth every 6 (six) hours as needed for mild pain, fever or headache (or Fever  >/= 101). Pearlean Manus, MD  Active Child  albuterol  (VENTOLIN  HFA) 108 (90 Base) MCG/ACT inhaler [id number]  Inhale 2 puffs every 4 hours by inhalation route. [provider]  Active   apixaban  (ELIQUIS ) 5 MG TABS tablet [id number]  Take 1 tablet (5 mg total) by mouth 2 (two) times daily. Debera Jayson MATSU, MD  Active   Calcium  Carbonate (CALCIUM -CARB 600 PO) [id number]  Take 600 mg by mouth daily. [provider]  Active Child  cholecalciferol  (VITAMIN D ) 1000 units tablet [id number]  Take 1,000 Units by mouth daily. [provider]  Active Child  clotrimazole-betamethasone  (LOTRISONE) cream [id number]  Apply 1 Application topically. [provider]  Active   dextromethorphan -guaiFENesin  (MUCINEX  DM) 30-600 MG 12hr tablet [id number]  Take 1 tablet by mouth 2 (two) times daily. Pearlean Manus, MD  Active Child  ferrous sulfate  325 (65 FE) MG tablet [id number]  Take 325 mg by mouth daily with breakfast. [provider]  Active Child  gabapentin  (NEURONTIN ) 300 MG capsule [id number]  Take 300 mg by mouth 2 (two) times daily. [provider]  Active Child           Med Note CARMIN ETHA HERO   Wed Apr 05, 2022 12:16 PM)    ipratropium-albuterol  (DUONEB) 0.5-2.5 (3) MG/3ML SOLN [id number]  Take 3 mLs by nebulization 2 (two) times daily. Willette Adriana LABOR, MD  Active Child  metoprolol  tartrate 37.5 MG TABS [id number]  Take 1 tablet (37.5 mg total) by mouth 2 (two) times daily. Evonnie Lenis, MD  Active   Multiple Vitamin (MULTIVITAMIN WITH MINERALS) TABS tablet [id number]  Take 1 tablet by mouth daily. [provider]  Active Child  nystatin  (MYCOSTATIN /NYSTOP ) powder [id number]  APPLY TO AFFECTED AREA TWICE A DAY [provider]  Active Child           Med Note DRENA, ANGELICA G   Sat Jul 21, 2023  7:26 PM)    nystatin  (MYCOSTATIN /NYSTOP ) powder [id number]  Apply topically 2 (two) times daily. [provider]  Active   OXYGEN   [id number]  Inhale 3 L into the lungs. With long tube mostly 1-1.5 [provider]  Active Child  polyethylene glycol powder (GLYCOLAX /MIRALAX ) 17 GM/SCOOP powder [id number]  Take 8.5 g by mouth daily. Cinderella Deatrice FALCON, MD  Active   potassium chloride  (KLOR-CON ) 10 MEQ tablet [id number]  Take 1 tablet (10 mEq total) by mouth daily. Take While taking Demadex /Torsemide  Pearlean Manus, MD  Active Child  pravastatin  (PRAVACHOL ) 20 MG tablet [id number]  Take 20 mg by mouth daily. [provider]  Active Child  silver sulfADIAZINE (SILVADENE) 1 % cream [id number]  Apply 1 Application topically daily as needed (wound). [provider]  Active Child  torsemide  (DEMADEX ) 20 MG tablet [id number]  TAKE 2 TABLETS BY MOUTH TWICE A DAY Debera Jayson MATSU, MD  Active   Turmeric (QC TUMERIC COMPLEX) 500 MG CAPS [id number]  Take 1 capsule by mouth at bedtime. [provider]  Active Child  venlafaxine  (EFFEXOR ) 37.5 MG tablet [id number]  Take 37.5 mg by mouth daily. [provider]  Active Child           Med Note CARMIN ETHA HERO   Wed Apr 05, 2022 12:16 PM)    Vibegron  (GEMTESA ) 75  MG TABS [id number]  Take 1 tablet (75 mg total) by mouth daily. McKenzie, Belvie CROME, MD  Active Child  vitamin C  (ASCORBIC ACID ) 500 MG tablet [id number]  Take 500 mg by mouth daily. [provider]  Active Child            Recommendation:   04/09/24 Dawn Gonzales DEL PCP Follow-up Specialty provider follow-up oncology 1024/25, pulmonology 12/05/23, urology 12/19/23 US  on 12/10/23  Continue Current Plan of Care Consider a nutritional referral - last noted one in 2019   Follow Up Plan:   Telephone follow up appointment date/time:  01/03/24 1100   Abisai Deer L. Ramonita, RN, BSN, CCM [place]  [facility], [facility] RN Care Manager Direct Dial: [phone]  Fax: [phone]

## 2023-11-30 NOTE — Patient Instructions (Addendum)
 Visit Information  Thank you for taking time to visit with me today. Please don't hesitate to contact me if I can be of assistance to you before our next scheduled appointment.  Your next care management appointment is by telephone on 01/03/24 at 1100     Please call the care guide team at [phone] if you need to cancel, schedule, or reschedule an appointment.   Please call the Suicide and Crisis Lifeline: 988 call the [place]  National Suicide Prevention Lifeline: 401-024-6327 or TTY: 903-021-9182 TTY (315) 541-8113) to talk to a trained counselor call 1-800-273-TALK (toll free, 24 hour hotline) go to [facility] [address] [place] [phone]) call 911 if you are experiencing a Mental Health or Behavioral Health Crisis or need someone to talk to.  Serenity Batley L. Ramonita, RN, BSN, CCM [place]  [facility], [facility] RN Care Manager Direct Dial: [phone]  Fax: [phone]

## 2023-11-30 NOTE — Assessment & Plan Note (Deleted)
-   Anemia most likely from blood loss from being on Eliquis .  Her hemoglobin was normal in March prior to starting Eliquis .  Macrocytosis likely from reticulocytosis. -She received 1 g of INFeD  on 10/19/2023. -Additional lab work from workup with Dr. Rogers - We have sent MMA, copper  levels as her B12 and folic acid were normal.  Will also check for SPEP, free light chains and immunofixation.  Will check stool for occult blood.  Will also do hemolysis labs. - She will proceed with INFeD .  RTC 6 weeks with repeat CBC, ferritin and iron  panel.  We will discuss the results of above labs at that time.

## 2023-12-04 DIAGNOSIS — G44009 Cluster headache syndrome, unspecified, not intractable: Secondary | ICD-10-CM | POA: Diagnosis not present

## 2023-12-04 DIAGNOSIS — G4733 Obstructive sleep apnea (adult) (pediatric): Secondary | ICD-10-CM | POA: Diagnosis not present

## 2023-12-05 ENCOUNTER — Encounter (HOSPITAL_BASED_OUTPATIENT_CLINIC_OR_DEPARTMENT_OTHER): Payer: Self-pay

## 2023-12-05 ENCOUNTER — Ambulatory Visit (HOSPITAL_BASED_OUTPATIENT_CLINIC_OR_DEPARTMENT_OTHER)

## 2023-12-05 VITALS — BP 123/52 | HR 59 | Ht 66.0 in | Wt 270.0 lb

## 2023-12-05 DIAGNOSIS — J9612 Chronic respiratory failure with hypercapnia: Secondary | ICD-10-CM

## 2023-12-05 DIAGNOSIS — I5032 Chronic diastolic (congestive) heart failure: Secondary | ICD-10-CM | POA: Diagnosis not present

## 2023-12-05 DIAGNOSIS — J9611 Chronic respiratory failure with hypoxia: Secondary | ICD-10-CM

## 2023-12-05 DIAGNOSIS — G4733 Obstructive sleep apnea (adult) (pediatric): Secondary | ICD-10-CM | POA: Diagnosis not present

## 2023-12-05 NOTE — Patient Instructions (Signed)
 Follow up with Dr. Jude in 4 months for compliance download review.   Based on your ARISCAT score, you are at low risk for complications from EGD/colonoscopy.  Keep in mind that you may need supplemental oxygen  and/or BiPAP after the procedure given your baseline needs.

## 2023-12-05 NOTE — Progress Notes (Signed)
 @Patient  ID: Dawn Gonzales, female    DOB: 1940/12/30, 83 y.o.   MRN: [id number]  Chief Complaint  Patient presents with   Follow-up    Surgery clearance        Referring provider: [name], MD  HPI: [name] is an 82 y/o female with PMH of severe AS, pulmonary HTN, chronic Afib, OSA, chronic respiratory failure with hypercapnia, restrictive lung disease, and obesity who presents today for pulmonary clearance.  She reports that she has had some issues with anemia which have required iron  transfusions.  Due to this it was recommended that she have EGD with colonoscopy for further evaluation.  She is here today for risk assessment given the need for EGD and colonoscopy.  She reports daily compliance with her oxygen  at 2 L/min.  She also reports compliance with her BiPAP averaging around 4 hours/day.  She denies any cough, shortness of breath, chest pain, headache, dizziness, fever, chills or other complaints.  TEST/EVENTS : Recent iron  infusions for anemia.  No Known Allergies  Immunization History  Administered Date(s) Administered   Fluad Trivalent(High Dose 65+) 02/23/2023   Influenza Split 01/22/2012, 02/12/2013, 07/08/2014, 01/10/2016, 03/12/2017   Influenza, High Dose Seasonal PF 02/07/2018, 04/20/2021, 04/20/2021, 04/20/2021, 02/20/2022, 02/20/2022, 02/20/2022   Influenza,inj,quad, With Preservative 04/20/2021, 02/20/2022   Moderna Covid-19 Fall Seasonal Vaccine 49yrs & older 02/23/2023   Moderna Sars-Cov-2 Peds vaccine 22yrs thru 79yrs 02/20/2022   Pneumococcal Conjugate-13 07/08/2014   Respiratory Syncytial Virus Vaccine,Recomb Aduvanted(Arexvy) 02/20/2022    Past Medical History:  Diagnosis Date   Anemia    Anxiety    Arthritis    Bilateral renal cysts    Cholecystitis    Chronic diastolic heart failure (HCC)    Depression    Essential hypertension    History of cellulitis    History of CVA (cerebrovascular accident)    History of  endocarditis    Hyperlipemia    Morbid obesity (HCC)    Neuropathy    Right bundle branch block (RBBB)    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   26 mm Edwards Sapien 3 transcatheter heart valve placed via percutaneous right transfemoral approach    Severe aortic stenosis    Sleep apnea     Tobacco History: Social History   Tobacco Use  Smoking Status Former   Current packs/day: 0.00   Types: Cigarettes   Quit date: 05/23/1978   Years since quitting: 45.5   Passive exposure: Never  Smokeless Tobacco Never   Counseling given: Not Answered   Outpatient Medications Prior to Visit  Medication Sig Dispense Refill   acetaminophen  (TYLENOL ) 325 MG tablet Take 2 tablets (650 mg total) by mouth every 6 (six) hours as needed for mild pain, fever or headache (or Fever >/= 101). 12 tablet 0   albuterol  (VENTOLIN  HFA) 108 (90 Base) MCG/ACT inhaler Inhale 2 puffs every 4 hours by inhalation route.     apixaban  (ELIQUIS ) 5 MG TABS tablet Take 1 tablet (5 mg total) by mouth 2 (two) times daily. 60 tablet 6   Calcium  Carbonate (CALCIUM -CARB 600 PO) Take 600 mg by mouth daily.     cholecalciferol  (VITAMIN D ) 1000 units tablet Take 1,000 Units by mouth daily.     clotrimazole-betamethasone  (LOTRISONE) cream Apply 1 Application topically.     dextromethorphan -guaiFENesin  (MUCINEX  DM) 30-600 MG 12hr tablet Take 1 tablet by mouth 2 (two) times daily. 20 tablet 0   ferrous sulfate  325 (65 FE) MG tablet  Take 325 mg by mouth daily with breakfast.     gabapentin  (NEURONTIN ) 300 MG capsule Take 300 mg by mouth 2 (two) times daily.     ipratropium-albuterol  (DUONEB) 0.5-2.5 (3) MG/3ML SOLN Take 3 mLs by nebulization 2 (two) times daily. 360 mL 0   metoprolol  tartrate 37.5 MG TABS Take 1 tablet (37.5 mg total) by mouth 2 (two) times daily. 60 tablet 1   Multiple Vitamin (MULTIVITAMIN WITH MINERALS) TABS tablet Take 1 tablet by mouth daily.     nystatin  (MYCOSTATIN /NYSTOP ) powder APPLY  TO AFFECTED AREA TWICE A DAY     nystatin  (MYCOSTATIN /NYSTOP ) powder Apply topically 2 (two) times daily.     OXYGEN  Inhale 3 L into the lungs. With long tube mostly 1-1.5     polyethylene glycol powder (GLYCOLAX /MIRALAX ) 17 GM/SCOOP powder Take 8.5 g by mouth daily.     potassium chloride  (KLOR-CON ) 10 MEQ tablet Take 1 tablet (10 mEq total) by mouth daily. Take While taking Demadex /Torsemide  30 tablet 2   pravastatin  (PRAVACHOL ) 20 MG tablet Take 20 mg by mouth daily.     silver sulfADIAZINE (SILVADENE) 1 % cream Apply 1 Application topically daily as needed (wound).     torsemide  (DEMADEX ) 20 MG tablet TAKE 2 TABLETS BY MOUTH TWICE A DAY 360 tablet 0   Turmeric (QC TUMERIC COMPLEX) 500 MG CAPS Take 1 capsule by mouth at bedtime.     venlafaxine  (EFFEXOR ) 37.5 MG tablet Take 37.5 mg by mouth daily.     Vibegron  (GEMTESA ) 75 MG TABS Take 1 tablet (75 mg total) by mouth daily. 30 tablet 11   vitamin C  (ASCORBIC ACID ) 500 MG tablet Take 500 mg by mouth daily.     No facility-administered medications prior to visit.     Review of Systems:   Constitutional:   No  weight loss, night sweats,  Fevers, chills, fatigue, or  lassitude.  HEENT:   No headaches,  Difficulty swallowing,  Tooth/dental problems, or  Sore throat,                No sneezing, itching, ear ache, nasal congestion, post nasal drip,   CV:  No chest pain,  Orthopnea, PND, swelling in lower extremities, anasarca, dizziness, palpitations, syncope.   GI  No heartburn, indigestion, abdominal pain, nausea, vomiting, diarrhea, change in bowel habits, loss of appetite, bloody stools.   Resp: No shortness of breath with exertion or at rest.  No excess mucus, no productive cough,  No non-productive cough,  No coughing up of blood.  No change in color of mucus.  No wheezing.  No chest wall deformity  Skin: no rash or lesions.  GU: no dysuria, change in color of urine, no urgency or frequency.  No flank pain, no hematuria   MS:  No  joint pain or swelling.  No decreased range of motion.  No back pain.    Physical Exam  BP (!) 123/52 (BP Location: Left Arm, Patient Position: Sitting, Cuff Size: Large)   Pulse (!) 59   Ht 5' 6 (1.676 m)   Wt 270 lb (122.5 kg)   SpO2 97% Comment: 2 LITERS  BMI 43.58 kg/m   GEN: A/Ox3; pleasant , NAD, well nourished    HEENT:  /AT,  EACs-clear, TMs-wnl, NOSE-clear, THROAT-clear, no lesions, no postnasal drip or exudate noted.   NECK:  Supple w/ fair ROM; no JVD; normal carotid impulses w/o bruits; no thyromegaly or nodules palpated; no lymphadenopathy.    RESP  Clear  P & A; w/o, wheezes/ rales/ or rhonchi. no accessory muscle use, no dullness to percussion  CARD:  RRR, 2 out of 6 systolic ejection murmur no peripheral edema, pulses intact, no cyanosis or clubbing.  GI:   Obese soft & nt; nml bowel sounds; no organomegaly or masses detected.   Musco: Warm bil, no deformities or joint swelling noted.   Neuro: alert, no focal deficits noted.    Skin: Warm, no lesions or rashes    Lab Results:  CBC    Component Value Date/Time   WBC 6.9 11/22/2023 1033   RBC 3.75 (L) 11/22/2023 1033   HGB 11.3 (L) 11/22/2023 1033   HCT 38.3 11/22/2023 1033   PLT 212 11/22/2023 1033   MCV 102.1 (H) 11/22/2023 1033   MCH 30.1 11/22/2023 1033   MCHC 29.5 (L) 11/22/2023 1033   RDW 13.6 11/22/2023 1033   LYMPHSABS 1.0 10/17/2023 0909   MONOABS 0.5 10/17/2023 0909   EOSABS 0.2 10/17/2023 0909   BASOSABS 0.0 10/17/2023 0909    BMET    Component Value Date/Time   NA 138 07/23/2023 0513   NA 141 05/10/2018 1022   K 4.2 07/23/2023 0513   CL 97 (L) 07/23/2023 0513   CO2 32 07/23/2023 0513   GLUCOSE 103 (H) 07/23/2023 0513   BUN 26 (H) 07/23/2023 0513   BUN 27 05/10/2018 1022   CREATININE 0.84 07/23/2023 0513   CALCIUM  8.9 07/23/2023 0513   GFRNONAA >60 07/23/2023 0513   GFRAA >60 08/28/2019 0534    BNP    Component Value Date/Time   BNP 86.0 05/09/2022 1132     ProBNP No results found for: PROBNP  Imaging: No results found.  cetirizine  (QUZYTTIR ) injection 10 mg     Date Action Dose Route User   10/19/2023 1003 Given 10 mg Intravenous Brown, Brianna S, RN      famotidine  (PEPCID ) IVPB 20 mg premix     Date Action Dose Route User   10/19/2023 1008 Infusion Verify (none) Intravenous Celena Creola BROCKS, RN   10/19/2023 1008 New Bag/Given 20 mg Intravenous Brown, Brianna S, RN      iron  dextran complex (INFED ) 50 MG/ML 950 mg in sodium chloride  0.9 % 500 mL IVPB     Date Action Dose Route User   10/19/2023 1400 Restarted (none) Intravenous Celena Creola BROCKS, RN   10/19/2023 1354 Infusion Verify (none) Intravenous Celena Creola BROCKS, RN   10/19/2023 1147 Infusion Verify (none) Intravenous Celena Creola BROCKS, RN   10/19/2023 1147 New Bag/Given 950 mg Intravenous Brown, Brianna S, RN      iron  dextran complex (INFED ) 50 MG/ML 50 mg in sodium chloride  0.9 % 50 mL IVPB     Date Action Dose Route User   10/19/2023 1101 New Bag/Given 50 mg Intravenous Brown, Brianna S, RN      methylPREDNISolone  sodium succinate (SOLU-MEDROL ) 125 mg/2 mL injection 125 mg     Date Action Dose Route User   10/19/2023 1005 Given 125 mg Intravenous Brown, Brianna S, RN      0.9 %  sodium chloride  infusion     Date Action Dose Route User   10/19/2023 1430 Rate/Dose Change (none) Intravenous Celena Creola BROCKS, RN   10/19/2023 1403 Restarted (none) Intravenous Celena Creola BROCKS, RN   10/19/2023 1356 Rate/Dose Change (none) Intravenous Celena Creola BROCKS, RN   10/19/2023 1144 Rate/Dose Change (none) Intravenous Celena Creola BROCKS, RN   10/19/2023 1143 Rate/Dose Change (none) Intravenous Celena Creola BROCKS, RN  Latest Ref Rng & Units 03/22/2023    2:16 PM  PFT Results  FVC-Pre L 1.25   FVC-Predicted Pre % 47   FVC-Post L 1.33   FVC-Predicted Post % 50   Pre FEV1/FVC % % 90   Post FEV1/FCV % % 89   FEV1-Pre L 1.13   FEV1-Predicted Pre % 57   FEV1-Post L 1.19    DLCO uncorrected ml/min/mmHg 12.85   DLCO UNC% % 66   DLCO corrected ml/min/mmHg 12.85   DLCO COR %Predicted % 66   DLVA Predicted % 120     No results found for: NITRICOXIDE   Assessment & Plan:  [name] is an 83 y/o female with PMH of severe AS, pulmonary HTN, chronic Afib, OSA, chronic respiratory failure with hypercapnia, restrictive lung disease, and obesity who presents today for pulmonary clearance.  Based on her ARISCAT score she is low risk for postoperative pulmonary complications.  Please note that the oxygen  saturation level used for this calculation was 97%, however, this was on 2 L/min nasal cannula.  Please also note that the patient wears BiPAP at night.  She may require postprocedure BiPAP in addition to supplemental oxygen .   Low risk 1.6% risk of in-hospital post-op pulmonary complications (composite including respiratory failure, respiratory infection, pleural effusion, atelectasis, pneumothorax, bronchospasm, aspiration pneumonitis) Assessment & Plan Chronic respiratory failure with hypoxia and hypercapnia (HCC) - Continue BiPAP and supplemental oxygen . OSA (obstructive sleep apnea) - Follow-up with compliance download in 4 months as previously scheduled. Chronic diastolic CHF (congestive heart failure), NYHA class 2 (HCC) - Noted.   No follow-ups on file.  Candis Dandy, PA-C 12/05/2023

## 2023-12-10 ENCOUNTER — Ambulatory Visit (HOSPITAL_COMMUNITY)
Admission: RE | Admit: 2023-12-10 | Discharge: 2023-12-10 | Disposition: A | Discharge status: Home / Self Care | Payer: Medicare PPO | Source: Ambulatory Visit | Attending: Urology | Admitting: Urology

## 2023-12-10 ENCOUNTER — Other Ambulatory Visit: Payer: Self-pay | Admitting: Cardiology

## 2023-12-10 DIAGNOSIS — N2889 Other specified disorders of kidney and ureter: Secondary | ICD-10-CM | POA: Diagnosis not present

## 2023-12-12 ENCOUNTER — Encounter: Payer: Self-pay | Admitting: Cardiology

## 2023-12-12 ENCOUNTER — Encounter: Payer: Self-pay | Admitting: Urology

## 2023-12-12 MED ORDER — TORSEMIDE 20 MG PO TABS: 40.0000 mg | ORAL_TABLET | Freq: Two times a day (BID) | ORAL | 2 refills | Status: AC

## 2023-12-19 ENCOUNTER — Telehealth: Payer: Medicare PPO | Admitting: Urology

## 2023-12-24 NOTE — Patient Instructions (Signed)
 Dawn Gonzales [facility]  12/24/2023     @PREFPERIOPPHARMACY @   Your procedure is scheduled on 12/27/2023.   Report to [facility] at  0600 A.M.   Call this number if you have problems the morning of surgery:  [phone]  If you experience any cold or flu symptoms such as cough, fever, chills, shortness of breath, etc. between now and your scheduled surgery, please notify us  at the above number.   Remember:  Follow the diet and prep instructions given to you by the office.        DO NOT take any more eliquis  before your procedure.       Use your nebulizer and your inhaler before you come and bring your rescue inhaler with you.   You may drink clear liquids until 0330 am on 12/27/2023.    Clear liquids allowed are:                    Water, Juice (No red color; non-citric and without pulp; diabetics please choose diet or no sugar options), Carbonated beverages (diabetics please choose diet or no sugar options), Clear Tea (No creamer, milk, or cream, including half & half and powdered creamer), Black Coffee Only (No creamer, milk or cream, including half & half and powdered creamer), and Clear Sports drink (No red color; diabetics please choose diet or no sugar options)    Take these medicines the morning of surgery with A SIP OF WATER                                 gabapentin , metoprolol , effexor .    Do not wear jewelry, make-up or nail polish, including gel polish,  artificial nails, or any other type of covering on natural nails (fingers and  toes).  Do not wear lotions, powders, or perfumes, or deodorant.  Do not shave 48 hours prior to surgery.  Men may shave face and neck.  Do not bring valuables to the hospital.  [facility] is not responsible for any belongings or valuables.  Contacts, dentures or bridgework may not be worn into surgery.  Leave your suitcase in the car.  After surgery it may be brought to your room.  For patients admitted to the hospital,  discharge time will be determined by your treatment team.  Patients discharged the day of surgery will not be allowed to drive home and must have someone with them for 24 hours.    Special instructions:   DO NOT smoke tobacco or vape for 24 hours before your procedure.  Please read over the following fact sheets that you were given. Anesthesia Post-op Instructions and Care and Recovery After Surgery      Upper Endoscopy, Adult, Care After After the procedure, it is common to have a sore throat. It is also common to have: Mild stomach pain or discomfort. Bloating. Nausea. Follow these instructions at home: The instructions below may help you care for yourself at home. Your health care provider may give you more instructions. If you have questions, ask your health care provider. If you were given a sedative during the procedure, it can affect you for several hours. Do not drive or operate machinery until your health care provider says that it is safe. If you will be going home right after the procedure, plan to have a responsible adult: Take you home from the hospital  or clinic. You will not be allowed to drive. Care for you for the time you are told. Follow instructions from your health care provider about what you may eat and drink. Return to your normal activities as told by your health care provider. Ask your health care provider what activities are safe for you. Take over-the-counter and prescription medicines only as told by your health care provider. Contact a health care provider if you: Have a sore throat that lasts longer than one day. Have trouble swallowing. Have a fever. Get help right away if you: Vomit blood or your vomit looks like coffee grounds. Have bloody, black, or tarry stools. Have a very bad sore throat or you cannot swallow. Have difficulty breathing or very bad pain in your chest or abdomen. These symptoms may be an emergency. Get help right away. Call  911. Do not wait to see if the symptoms will go away. Do not drive yourself to the hospital. Summary After the procedure, it is common to have a sore throat, mild stomach discomfort, bloating, and nausea. If you were given a sedative during the procedure, it can affect you for several hours. Do not drive until your health care provider says that it is safe. Follow instructions from your health care provider about what you may eat and drink. Return to your normal activities as told by your health care provider. This information is not intended to replace advice given to you by your health care provider. Make sure you discuss any questions you have with your health care provider. Document Revised: 07/27/2021 Document Reviewed: 07/27/2021 Elsevier Patient Education  2024 [employer].Colonoscopy, Adult, Care After The following information offers guidance on how to care for yourself after your procedure. Your health care provider may also give you more specific instructions. If you have problems or questions, contact your health care provider. What can I expect after the procedure? After the procedure, it is common to have: A small amount of blood in your stool for 24 hours after the procedure. Some gas. Mild cramping or bloating of your abdomen. Follow these instructions at home: Eating and drinking  Drink enough fluid to keep your urine pale yellow. Follow instructions from your health care provider about eating or drinking restrictions. Resume your normal diet as told by your health care provider. Avoid heavy or fried foods that are hard to digest. Activity Rest as told by your health care provider. Avoid sitting for a long time without moving. Get up to take short walks every 1-2 hours. This is important to improve blood flow and breathing. Ask for help if you feel weak or unsteady. Return to your normal activities as told by your health care provider. Ask your health care provider what  activities are safe for you. Managing cramping and bloating  Try walking around when you have cramps or feel bloated. If directed, apply heat to your abdomen as told by your health care provider. Use the heat source that your health care provider recommends, such as a moist heat pack or a heating pad. Place a towel between your skin and the heat source. Leave the heat on for 20-30 minutes. Remove the heat if your skin turns bright red. This is especially important if you are unable to feel pain, heat, or cold. You have a greater risk of getting burned. General instructions If you were given a sedative during the procedure, it can affect you for several hours. Do not drive or operate machinery until your health  care provider says that it is safe. For the first 24 hours after the procedure: Do not sign important documents. Do not drink alcohol. Do your regular daily activities at a slower pace than normal. Eat soft foods that are easy to digest. Take over-the-counter and prescription medicines only as told by your health care provider. Keep all follow-up visits. This is important. Contact a health care provider if: You have blood in your stool 2-3 days after the procedure. Get help right away if: You have more than a small spotting of blood in your stool. You have large blood clots in your stool. You have swelling of your abdomen. You have nausea or vomiting. You have a fever. You have increasing pain in your abdomen that is not relieved with medicine. These symptoms may be an emergency. Get help right away. Call 911. Do not wait to see if the symptoms will go away. Do not drive yourself to the hospital. Summary After the procedure, it is common to have a small amount of blood in your stool. You may also have mild cramping and bloating of your abdomen. If you were given a sedative during the procedure, it can affect you for several hours. Do not drive or operate machinery until your  health care provider says that it is safe. Get help right away if you have a lot of blood in your stool, nausea or vomiting, a fever, or increased pain in your abdomen. This information is not intended to replace advice given to you by your health care provider. Make sure you discuss any questions you have with your health care provider. Document Revised: 05/30/2022 Document Reviewed: 12/08/2020 Elsevier Patient Education  2024 Elsevier Inc.General Anesthesia, Adult, Care After The following information offers guidance on how to care for yourself after your procedure. Your health care provider may also give you more specific instructions. If you have problems or questions, contact your health care provider. What can I expect after the procedure? After the procedure, it is common for people to: Have pain or discomfort at the IV site. Have nausea or vomiting. Have a sore throat or hoarseness. Have trouble concentrating. Feel cold or chills. Feel weak, sleepy, or tired (fatigue). Have soreness and body aches. These can affect parts of the body that were not involved in surgery. Follow these instructions at home: For the time period you were told by your health care provider:  Rest. Do not participate in activities where you could fall or become injured. Do not drive or use machinery. Do not drink alcohol. Do not take sleeping pills or medicines that cause drowsiness. Do not make important decisions or sign legal documents. Do not take care of children on your own. General instructions Drink enough fluid to keep your urine pale yellow. If you have sleep apnea, surgery and certain medicines can increase your risk for breathing problems. Follow instructions from your health care provider about wearing your sleep device: Anytime you are sleeping, including during daytime naps. While taking prescription pain medicines, sleeping medicines, or medicines that make you drowsy. Return to your  normal activities as told by your health care provider. Ask your health care provider what activities are safe for you. Take over-the-counter and prescription medicines only as told by your health care provider. Do not use any products that contain nicotine or tobacco. These products include cigarettes, chewing tobacco, and vaping devices, such as e-cigarettes. These can delay incision healing after surgery. If you need help quitting, ask your health care  provider. Contact a health care provider if: You have nausea or vomiting that does not get better with medicine. You vomit every time you eat or drink. You have pain that does not get better with medicine. You cannot urinate or have bloody urine. You develop a skin rash. You have a fever. Get help right away if: You have trouble breathing. You have chest pain. You vomit blood. These symptoms may be an emergency. Get help right away. Call 911. Do not wait to see if the symptoms will go away. Do not drive yourself to the hospital. Summary After the procedure, it is common to have a sore throat, hoarseness, nausea, vomiting, or to feel weak, sleepy, or fatigue. For the time period you were told by your health care provider, do not drive or use machinery. Get help right away if you have difficulty breathing, have chest pain, or vomit blood. These symptoms may be an emergency. This information is not intended to replace advice given to you by your health care provider. Make sure you discuss any questions you have with your health care provider. Document Revised: 07/15/2021 Document Reviewed: 07/15/2021 Elsevier Patient Education  2024 [employer].

## 2023-12-25 ENCOUNTER — Encounter (HOSPITAL_COMMUNITY): Payer: Self-pay

## 2023-12-25 ENCOUNTER — Encounter (HOSPITAL_COMMUNITY)
Admission: RE | Admit: 2023-12-25 | Discharge: 2023-12-25 | Disposition: A | Discharge status: Home / Self Care | Source: Ambulatory Visit | Attending: Gastroenterology | Admitting: Gastroenterology

## 2023-12-25 VITALS — BP 102/47 | HR 62 | Temp 98.4°F | Resp 22

## 2023-12-25 DIAGNOSIS — Z79899 Other long term (current) drug therapy: Secondary | ICD-10-CM | POA: Insufficient documentation

## 2023-12-25 DIAGNOSIS — Z01812 Encounter for preprocedural laboratory examination: Secondary | ICD-10-CM | POA: Insufficient documentation

## 2023-12-25 HISTORY — DX: Presence of cardiac pacemaker: Z95.0

## 2023-12-25 LAB — BASIC METABOLIC PANEL WITH GFR
Anion gap: 10 (ref 5–15)
BUN: 36 mg/dL — ABNORMAL HIGH (ref 8–23)
CO2: 37 mmol/L — ABNORMAL HIGH (ref 22–32)
Calcium: 9 mg/dL (ref 8.9–10.3)
Chloride: 95 mmol/L — ABNORMAL LOW (ref 98–111)
Creatinine, Ser: 0.75 mg/dL (ref 0.44–1.00)
GFR, Estimated: >60 mL/min (ref >60)
Glucose, Bld: 121 mg/dL — ABNORMAL HIGH (ref 70–99)
Potassium: 3.9 mmol/L (ref 3.5–5.1)
Sodium: 142 mmol/L (ref 135–145)

## 2023-12-25 NOTE — Pre-Procedure Instructions (Signed)
 Verified patient has letter from GI with prep instructions, reviewed verbalized understanding

## 2023-12-27 ENCOUNTER — Ambulatory Visit (HOSPITAL_COMMUNITY): Admitting: Anesthesiology

## 2023-12-27 ENCOUNTER — Other Ambulatory Visit: Payer: Self-pay

## 2023-12-27 ENCOUNTER — Ambulatory Visit (HOSPITAL_COMMUNITY)
Admission: RE | Admit: 2023-12-27 | Discharge: 2023-12-27 | Disposition: A | Discharge status: Home / Self Care | Attending: Gastroenterology | Admitting: Gastroenterology

## 2023-12-27 ENCOUNTER — Encounter (HOSPITAL_COMMUNITY)
Admission: RE | Disposition: A | Discharge status: Home / Self Care | Payer: Self-pay | Source: Home / Self Care | Attending: Gastroenterology

## 2023-12-27 ENCOUNTER — Encounter (HOSPITAL_COMMUNITY): Payer: Self-pay | Admitting: Gastroenterology

## 2023-12-27 DIAGNOSIS — K3189 Other diseases of stomach and duodenum: Secondary | ICD-10-CM | POA: Insufficient documentation

## 2023-12-27 DIAGNOSIS — I110 Hypertensive heart disease with heart failure: Secondary | ICD-10-CM | POA: Diagnosis not present

## 2023-12-27 DIAGNOSIS — I2510 Atherosclerotic heart disease of native coronary artery without angina pectoris: Secondary | ICD-10-CM | POA: Diagnosis not present

## 2023-12-27 DIAGNOSIS — K2970 Gastritis, unspecified, without bleeding: Secondary | ICD-10-CM | POA: Insufficient documentation

## 2023-12-27 DIAGNOSIS — D509 Iron deficiency anemia, unspecified: Secondary | ICD-10-CM | POA: Diagnosis not present

## 2023-12-27 DIAGNOSIS — D124 Benign neoplasm of descending colon: Secondary | ICD-10-CM | POA: Diagnosis not present

## 2023-12-27 DIAGNOSIS — Z87891 Personal history of nicotine dependence: Secondary | ICD-10-CM | POA: Diagnosis not present

## 2023-12-27 DIAGNOSIS — K635 Polyp of colon: Secondary | ICD-10-CM | POA: Insufficient documentation

## 2023-12-27 DIAGNOSIS — G4730 Sleep apnea, unspecified: Secondary | ICD-10-CM | POA: Insufficient documentation

## 2023-12-27 DIAGNOSIS — K644 Residual hemorrhoidal skin tags: Secondary | ICD-10-CM | POA: Diagnosis not present

## 2023-12-27 DIAGNOSIS — Z952 Presence of prosthetic heart valve: Secondary | ICD-10-CM | POA: Diagnosis not present

## 2023-12-27 DIAGNOSIS — I5032 Chronic diastolic (congestive) heart failure: Secondary | ICD-10-CM | POA: Insufficient documentation

## 2023-12-27 DIAGNOSIS — K5900 Constipation, unspecified: Secondary | ICD-10-CM | POA: Diagnosis not present

## 2023-12-27 DIAGNOSIS — K648 Other hemorrhoids: Secondary | ICD-10-CM | POA: Diagnosis not present

## 2023-12-27 DIAGNOSIS — Z950 Presence of cardiac pacemaker: Secondary | ICD-10-CM | POA: Diagnosis not present

## 2023-12-27 DIAGNOSIS — K2971 Gastritis, unspecified, with bleeding: Secondary | ICD-10-CM

## 2023-12-27 DIAGNOSIS — I4510 Unspecified right bundle-branch block: Secondary | ICD-10-CM | POA: Diagnosis not present

## 2023-12-27 DIAGNOSIS — I350 Nonrheumatic aortic (valve) stenosis: Secondary | ICD-10-CM | POA: Diagnosis not present

## 2023-12-27 HISTORY — PX: COLONOSCOPY: SHX5424

## 2023-12-27 HISTORY — PX: ESOPHAGOGASTRODUODENOSCOPY: SHX5428

## 2023-12-27 LAB — HM COLONOSCOPY

## 2023-12-27 SURGERY — COLONOSCOPY
Anesthesia: General

## 2023-12-27 MED ORDER — PHENYLEPHRINE 80 MCG/ML (10ML) SYRINGE FOR IV PUSH (FOR BLOOD PRESSURE SUPPORT)
PREFILLED_SYRINGE | INTRAVENOUS | Status: DC | PRN
  Administered 2023-12-27 (×3): 160 ug via INTRAVENOUS

## 2023-12-27 MED ORDER — LACTATED RINGERS IV SOLN: INTRAVENOUS | Status: DC

## 2023-12-27 MED ORDER — PROPOFOL 500 MG/50ML IV EMUL
INTRAVENOUS | Status: DC | PRN
  Administered 2023-12-27: 150 ug/kg/min via INTRAVENOUS

## 2023-12-27 MED ORDER — LIDOCAINE 2% (20 MG/ML) 5 ML SYRINGE
INTRAMUSCULAR | Status: DC | PRN
  Administered 2023-12-27: 100 mg via INTRAVENOUS

## 2023-12-27 MED ORDER — PANTOPRAZOLE SODIUM 40 MG PO TBEC: 40.0000 mg | DELAYED_RELEASE_TABLET | Freq: Every day | ORAL | 1 refills | Status: DC

## 2023-12-27 MED ORDER — GLYCOPYRROLATE PF 0.2 MG/ML IJ SOSY
PREFILLED_SYRINGE | INTRAMUSCULAR | Status: DC | PRN
  Administered 2023-12-27: .2 mg via INTRAVENOUS

## 2023-12-27 MED ORDER — PROPOFOL 10 MG/ML IV BOLUS
INTRAVENOUS | Status: DC | PRN
Start: 2023-12-27 — End: 2023-12-27
  Administered 2023-12-27: 50 mg via INTRAVENOUS

## 2023-12-27 NOTE — Anesthesia Postprocedure Evaluation (Signed)
 Anesthesia Post Note  Patient: Dawn Gonzales [facility]  Procedure(s) Performed: COLONOSCOPY EGD (ESOPHAGOGASTRODUODENOSCOPY)  Patient location during evaluation: Phase II Anesthesia Type: General Level of consciousness: awake Pain management: pain level controlled Vital Signs Assessment: post-procedure vital signs reviewed and stable Respiratory status: spontaneous breathing and respiratory function stable Cardiovascular status: blood pressure returned to baseline and stable Postop Assessment: no headache and no apparent nausea or vomiting Anesthetic complications: no Comments: Late entry   No notable events documented.   Last Vitals:  Vitals:   12/27/23 0651 12/27/23 0829  BP: (!) 134/54 (!) 149/90  Pulse: 75 91  Resp: 13 20  Temp: 36.9 C 36.5 C  SpO2: 93% 98%    Last Pain:  Vitals:   12/27/23 0829  TempSrc: Oral  PainSc: 0-No pain                 [name]

## 2023-12-27 NOTE — Discharge Instructions (Signed)

## 2023-12-27 NOTE — H&P (Signed)
 Primary Care Physician:  [name], MD Primary Gastroenterologist:  Dr. Cinderella  Pre-Procedure History & Physical: HPI:  Dawn Gonzales is a 83 y.o. female with multiple medical problems including coronary artery disease history of CVA, status post aortic valve replacement obesity history of cholecystitis treated with cholecystostomy on 2 occasions status post ERCP with sphincterotomy and stone extraction in October 2019 , Afib on eliquis  ( currently held because of acute anemia) , restrictive lung disease on continuous oxygen  (1.5L -4L)  who presents for evaluation of iron  deficiency anemia and constipation      Patient reports her bowel movements are usually pebble-like small and hard 2-3 times daily.  Denies seeing black or stools of fresh blood.The patient denies having any nausea, vomiting, fever, chills, hematochezia, melena, hematemesis, abdominal distention, abdominal pain, diarrhea, jaundice, pruritus or weight loss.     Labs from 06/25 hemoglobin 8.7 baseline 11-12 MCV 106 Iron  saturation 7% ferritin 37   Last EGD:2019 during ERCP Last Colonoscopy: Patient states her last colonoscopy was Dr. Waymond of [facility], [place] with removal of few small polyps before 2018   FHx: smokes cigarettes about 10 years but quit in 1980  Social: neg smoking, alcohol or illicit drug use Surgical: no abdominal surgeries    Past Medical History:  Diagnosis Date   Anemia    Anxiety    Arthritis    Bilateral renal cysts    Cholecystitis    Chronic diastolic heart failure (HCC)    Depression    Essential hypertension    History of cellulitis    History of CVA (cerebrovascular accident)    History of endocarditis    Hyperlipemia    Morbid obesity (HCC)    Neuropathy    Presence of permanent cardiac pacemaker    Right bundle branch block (RBBB)    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 ENDOSCOPY;  Service: Gastroenterology;  Laterality: N/A;   EUS  02/08/2018   Procedure: UPPER ENDOSCOPIC ULTRASOUND (EUS) LINEAR;  Surgeon: Wilhelmenia Aloha Raddle., MD;  Location: [facility] ENDOSCOPY;  Service: Gastroenterology;;   EYE SURGERY     IR FLUORO RM 30-60 MIN  03/27/2018   IR PERC CHOLECYSTOSTOMY  02/09/2018   IR RADIOLOGIST EVAL & MGMT  03/26/2018   IR RADIOLOGY PERIPHERAL GUIDED IV START  05/22/2018   IR RADIOLOGY PERIPHERAL GUIDED IV START  05/30/2018   IR RADIOLOGY PERIPHERAL GUIDED IV START  06/05/2018   IR US  GUIDE VASC ACCESS RIGHT  05/22/2018   IR US  GUIDE VASC ACCESS RIGHT  05/30/2018   IR US  GUIDE VASC ACCESS RIGHT  06/05/2018   PACEMAKER IMPLANT N/A 07/10/2018   Procedure: PACEMAKER IMPLANT;  Surgeon: Waddell Danelle ORN, MD;  Location: [facility] INVASIVE CV LAB;  Service: Cardiovascular;  Laterality: N/A;   REMOVAL OF STONES  02/08/2018   Procedure: REMOVAL OF STONES;  Surgeon: Wilhelmenia Aloha Raddle., MD;  Location: [facility] ENDOSCOPY;  Service: Gastroenterology;;   ANNETT  02/08/2018   Procedure: SPHINCTEROTOMY;  Surgeon: Wilhelmenia Aloha Raddle., MD;  Location: [facility] ENDOSCOPY;  Service: Gastroenterology;;   TONSILLECTOMY     TOOTH EXTRACTION N/A 06/18/2018   Procedure: DENTAL RESTORATION/EXTRACTIONS;  Surgeon: Sheryle Hamilton, DDS;  Location: [facility] OR;  Service: Oral Surgery;  Laterality: N/A;   TRANSCATHETER AORTIC VALVE REPLACEMENT, TRANSFEMORAL N/A 07/09/2018   Procedure: TRANSCATHETER AORTIC VALVE REPLACEMENT, TRANSFEMORAL;  Surgeon: Wonda Sharper, MD;  Location: [facility] INVASIVE CV  LAB;  Service: Cardiovascular;  Laterality: N/A;   Tummy tuck      Prior to Admission medications   Medication Sig Start Date End Date Taking? Authorizing Provider  acetaminophen  (TYLENOL ) 325 MG tablet Take 2 tablets (650 mg total) by mouth every 6 (six) hours as needed for mild pain, fever or headache (or Fever >/= 101). 07/10/19  Yes Emokpae, Courage, MD  albuterol  (VENTOLIN  HFA) 108 (90 Base) MCG/ACT inhaler Inhale 2 puffs every 4 hours by inhalation route.   Yes [provider]  Calcium  Carbonate (CALCIUM -CARB 600 PO) Take 600 mg by mouth daily.   Yes [provider]  cholecalciferol  (VITAMIN D ) 1000 units tablet Take 1,000 Units by mouth daily.   Yes [provider]  clotrimazole-betamethasone  (LOTRISONE) cream Apply 1 Application topically. 08/24/17  Yes [provider]  gabapentin  (NEURONTIN ) 300 MG capsule Take 300 mg by mouth 2 (two) times daily.   Yes [provider]  ipratropium-albuterol  (DUONEB) 0.5-2.5 (3) MG/3ML SOLN Take 3 mLs by nebulization 2 (two) times daily. 04/07/22  Yes Shahmehdi, Seyed A, MD  metoprolol  tartrate 37.5 MG TABS Take 1 tablet (37.5 mg total) by mouth 2 (two) times daily. 07/24/23  Yes Tat, Alm, MD  Multiple Vitamin (MULTIVITAMIN WITH MINERALS) TABS tablet Take 1 tablet by mouth daily.   Yes [provider]  nystatin  (MYCOSTATIN /NYSTOP ) powder APPLY TO AFFECTED AREA TWICE A DAY   Yes [provider]  nystatin  (MYCOSTATIN /NYSTOP ) powder Apply topically 2 (two) times daily.   Yes [provider]  OXYGEN  Inhale 3 L into the lungs. With long tube mostly 1-1.5   Yes [provider]  potassium chloride  (KLOR-CON ) 10 MEQ tablet Take 1 tablet (10 mEq total) by mouth daily. Take While taking Demadex /Torsemide  05/12/22  Yes Emokpae, Courage, MD  pravastatin  (PRAVACHOL ) 20 MG tablet Take 20 mg by mouth daily.   Yes [provider]  silver sulfADIAZINE (SILVADENE) 1 % cream Apply 1  Application topically daily as needed (wound).   Yes [provider]  torsemide  (DEMADEX ) 20 MG tablet Take 2 tablets (40 mg total) by mouth 2 (two) times daily. 12/12/23  Yes Debera Jayson MATSU, MD  Turmeric (QC TUMERIC COMPLEX) 500 MG CAPS Take 1 capsule by mouth at bedtime.   Yes [provider]  venlafaxine  (EFFEXOR ) 37.5 MG tablet Take 37.5 mg by mouth daily.   Yes [provider]  Vibegron  (GEMTESA ) 75 MG TABS Take 1 tablet (75 mg total) by mouth daily. 06/20/23  Yes McKenzie, Belvie CROME, MD  vitamin C  (ASCORBIC ACID ) 500 MG tablet Take 500 mg by mouth daily.   Yes [provider]  apixaban  (ELIQUIS ) 5 MG TABS tablet Take 1 tablet (5 mg total) by mouth 2 (two) times daily. 08/01/23   Debera Jayson MATSU, MD  dextromethorphan -guaiFENesin  (MUCINEX  DM) 30-600 MG 12hr tablet Take 1 tablet by mouth 2 (two) times daily. 05/12/22   Pearlean Manus, MD  ferrous sulfate  325 (65 FE) MG tablet Take 325 mg by mouth daily with breakfast.    [provider]  polyethylene glycol  powder (GLYCOLAX /MIRALAX ) 17 GM/SCOOP powder Take 8.5 g by mouth daily. 10/31/23   Cinderella Deatrice FALCON, MD    Allergies as of 11/26/2023   (No Known Allergies)    Family History  Problem Relation Age of Onset   Cancer Mother    Hypertension Father    Arthritis Father    Heart attack Father     Social History   Socioeconomic History   Marital status: Widowed    Spouse name: Not on file   Number of children: 3   Years of education: Not on file   Highest education level: Not on file  Occupational History   Occupation: Retired houswife  Tobacco Use   Smoking status: Former    Current packs/day: 0.00    Types: Cigarettes    Quit date: 05/23/1978    Years since quitting: 45.6    Passive exposure: Never   Smokeless tobacco: Never  Vaping Use   Vaping status: Never Used  Substance and Sexual Activity   Alcohol use: Not Currently    Comment: Occasional   Drug use: Never   Sexual  activity: Not on file  Other Topics Concern   Not on file  Social History Narrative   Not on file   Social Drivers of Health   Financial Resource Strain: Low Risk  (09/17/2023)   Overall Financial Resource Strain (CARDIA)    Difficulty of Paying Living Expenses: Not hard at all  Food Insecurity: No Food Insecurity (10/17/2023)   Hunger Vital Sign    Worried About Running Out of Food in the Last Year: Never true    Ran Out of Food in the Last Year: Never true  Transportation Needs: No Transportation Needs (10/17/2023)   PRAPARE - Administrator, Civil Service (Medical): No    Lack of Transportation (Non-Medical): No  Physical Activity: Insufficiently Active (09/03/2023)   Exercise Vital Sign    Days of Exercise per Week: 3 days    Minutes of Exercise per Session: 10 min  Stress: No Stress Concern Present (09/17/2023)   [employer] of Occupational Health - Occupational Stress Questionnaire    Feeling of Stress : Not at all  Social Connections: Socially Isolated (07/21/2023)   Social Connection and Isolation Panel    Frequency of Communication with Friends and Family: More than three times a week    Frequency of Social Gatherings with Friends and Family: More than three times a week    Attends Religious Services: Never    Database administrator or Organizations: No    Attends Banker Meetings: Never    Marital Status: Widowed  Intimate Partner Violence: Not At Risk (10/17/2023)   Humiliation, Afraid, Rape, and Kick questionnaire    Fear of Current or Ex-Partner: No    Emotionally Abused: No    Physically Abused: No    Sexually Abused: No    Review of Systems: See HPI, otherwise negative ROS  Physical Exam: Vital signs in last 24 hours: Temp:  [98.4 F (36.9 C)] 98.4 F (36.9 C) (08/28 0651) Pulse Rate:  [75] 75 (08/28 0651) Resp:  [13] 13 (08/28 0651) BP: (134)/(54) 134/54 (08/28 0651) SpO2:  [93 %] 93 % (08/28 0651) Weight:  [122.5 kg] 122.5  kg (08/28 0651)   General:   Alert,  Well-developed, well-nourished, pleasant and cooperative in NAD Head:  Normocephalic and atraumatic. Eyes:  Sclera clear, no icterus.   Conjunctiva pink. Ears:  Normal auditory acuity. Nose:  No deformity,  discharge,  or lesions. Msk:  Symmetrical without gross deformities. Normal posture. Extremities:  Without clubbing or edema. Neurologic:  Alert and  oriented x4;  grossly normal neurologically. Skin:  Intact without significant lesions or rashes. Psych:  Alert and cooperative. Normal mood and affect.  Impression/Plan: Dawn Gonzales is a 83 y.o. female with multiple medical problems including coronary artery disease history of CVA, status post aortic valve replacement obesity history of cholecystitis treated with cholecystostomy on 2 occasions status post ERCP with sphincterotomy and stone extraction in October 2019 , Afib on eliquis  ( currently held because of acute anemia) , restrictive lung disease on continuous oxygen  (1.5L -4L)  who presents for evaluation of iron  deficiency anemia and constipation   Proceed with upper endoscopy and colonoscopy   The risks of the procedure including infection, bleed, or perforation as well as benefits, limitations, alternatives and imponderables have been reviewed with the patient. Questions have been answered. All parties agreeable.

## 2023-12-27 NOTE — Transfer of Care (Signed)
 Immediate Anesthesia Transfer of Care Note  Patient: Dawn Gonzales  Procedure(s) Performed: COLONOSCOPY EGD (ESOPHAGOGASTRODUODENOSCOPY)  Patient Location: Short Stay  Anesthesia Type:General  Level of Consciousness: awake, alert , oriented, and patient cooperative  Airway & Oxygen  Therapy: Patient Spontanous Breathing and Patient connected to nasal cannula oxygen   Post-op Assessment: Report given to RN, Post -op Vital signs reviewed and stable, and Patient moving all extremities X 4  Post vital signs: Reviewed and stable  Last Vitals:  Vitals Value Taken Time  BP 149/90 12/27/23 08:29  Temp 36.5 C 12/27/23 08:29  Pulse 91 12/27/23 08:29  Resp 20 12/27/23 08:29  SpO2 98 % 12/27/23 08:29    Last Pain:  Vitals:   12/27/23 0829  TempSrc: Oral  PainSc: 0-No pain         Complications: No notable events documented.

## 2023-12-27 NOTE — Op Note (Addendum)
 [facility] Patient Name: Dawn Gonzales Procedure Date: 12/27/2023 6:52 AM MRN: [id number] Date of Birth: Dec 23, 1940 Attending MD: Deatrice Dine , MD, [id number] CSN: [id number] Age: 83 Admit Type: Outpatient Procedure:                Upper GI endoscopy Indications:              Unexplained iron  deficiency anemia Providers:                Deatrice Dine, MD, Crystal Page, Dorcas Lenis,                            Technician Referring MD:              Medicines:                Monitored Anesthesia Care Complications:            No immediate complications. Estimated Blood Loss:     Estimated blood loss was minimal. Procedure:                Pre-Anesthesia Assessment:                           - Prior to the procedure, a History and Physical                            was performed, and patient medications and                            allergies were reviewed. The patient's tolerance of                            previous anesthesia was also reviewed. The risks                            and benefits of the procedure and the sedation                            options and risks were discussed with the patient.                            All questions were answered, and informed consent                            was obtained. Prior Anticoagulants: The patient has                            taken Eliquis  (apixaban ), last dose was more than                            28 days prior to procedure. ASA Grade Assessment:                            III - A patient with severe systemic disease. After  reviewing the risks and benefits, the patient was                            deemed in satisfactory condition to undergo the                            procedure.                           After obtaining informed consent, the endoscope was                            passed under direct vision. Throughout the                            procedure, the patient's  blood pressure, pulse, and                            oxygen  saturations were monitored continuously. The                            [id number] ([id number]) Upper was introduced through                            the mouth, and advanced to the second part of                            duodenum. The upper GI endoscopy was accomplished                            without difficulty. The patient tolerated the                            procedure well. Scope In: 7:48:54 AM Scope Out: 7:54:45 AM Total Procedure Duration: 0 hours 5 minutes 51 seconds  Findings:      The examined esophagus was normal.      Diffuse severe inflammation characterized by adherent blood and erosions       was found in the entire examined stomach. Biopsies were taken with a       cold forceps for histology.      The duodenal bulb and second portion of the duodenum were normal. Impression:               - Normal esophagus.                           - Gastritis. Biopsied.                           - Normal duodenal bulb and second portion of the                            duodenum. Moderate Sedation:      Per Anesthesia Care Recommendation:           - Patient has a contact number available for  emergencies. The signs and symptoms of potential                            delayed complications were discussed with the                            patient. Return to normal activities tomorrow.                            Written discharge instructions were provided to the                            patient.                           - Resume previous diet.                           - Continue present medications.                           - Await pathology results.                           -Start PPI                           -Avoid NSAIDs Procedure Code(s):        --- Professional ---                           [phone], Esophagogastroduodenoscopy, flexible,                            transoral; with  biopsy, single or multiple Diagnosis Code(s):        --- Professional ---                           K29.70, Gastritis, unspecified, without bleeding                           D50.9, Iron  deficiency anemia, unspecified CPT copyright 2022 American Medical Association. All rights reserved. The codes documented in this report are preliminary and upon coder review may  be revised to meet current compliance requirements. Deatrice Dine, MD Deatrice Dine, MD 12/27/2023 8:29:42 AM This report has been signed electronically. Number of Addenda: 0

## 2023-12-27 NOTE — Anesthesia Preprocedure Evaluation (Signed)
 Anesthesia Evaluation  Patient identified by MRN, date of birth, ID band Patient awake    Reviewed: Allergy & Precautions, H&P , NPO status , Patient's Chart, lab work & pertinent test results, reviewed documented beta blocker date and time   Airway Mallampati: II  TM Distance: >3 FB Neck ROM: full    Dental no notable dental hx.    Pulmonary shortness of breath, sleep apnea , former smoker   Pulmonary exam normal breath sounds clear to auscultation       Cardiovascular Exercise Tolerance: Good hypertension, + CAD and +CHF  + dysrhythmias + pacemaker + Valvular Problems/Murmurs (s/p repair) AS  Rhythm:regular Rate:Normal     Neuro/Psych  PSYCHIATRIC DISORDERS Anxiety Depression     Neuromuscular disease    GI/Hepatic negative GI ROS, Neg liver ROS,,,  Endo/Other  negative endocrine ROS    Renal/GU Renal disease  negative genitourinary   Musculoskeletal   Abdominal   Peds  Hematology  (+) Blood dyscrasia, anemia   Anesthesia Other Findings   Reproductive/Obstetrics negative OB ROS                              Anesthesia Physical Anesthesia Plan  ASA: 3  Anesthesia Plan: General   Post-op Pain Management:    Induction:   PONV Risk Score and Plan: Propofol  infusion  Airway Management Planned:   Additional Equipment:   Intra-op Plan:   Post-operative Plan:   Informed Consent: I have reviewed the patients History and Physical, chart, labs and discussed the procedure including the risks, benefits and alternatives for the proposed anesthesia with the patient or authorized representative who has indicated his/her understanding and acceptance.     Dental Advisory Given  Plan Discussed with: CRNA  Anesthesia Plan Comments:         Anesthesia Quick Evaluation

## 2023-12-27 NOTE — Op Note (Addendum)
 [facility] Patient Name: Dawn Gonzales Procedure Date: 12/27/2023 6:53 AM MRN: [id number] Date of Birth: 26-Dec-1940 Attending MD: Deatrice Dine , MD, [id number] CSN: [id number] Age: 83 Admit Type: Outpatient Procedure:                Colonoscopy Indications:              Unexplained iron  deficiency anemia Providers:                Deatrice Dine, MD, Crystal Page Referring MD:              Medicines:                Monitored Anesthesia Care Complications:            No immediate complications. Estimated Blood Loss:     Estimated blood loss was minimal. Procedure:                Pre-Anesthesia Assessment:                           - Prior to the procedure, a History and Physical                            was performed, and patient medications and                            allergies were reviewed. The patient's tolerance of                            previous anesthesia was also reviewed. The risks                            and benefits of the procedure and the sedation                            options and risks were discussed with the patient.                            All questions were answered, and informed consent                            was obtained. Prior Anticoagulants: The patient has                            taken Eliquis  (apixaban ), last dose was more than                            28 days prior to procedure. ASA Grade Assessment:                            III - A patient with severe systemic disease. After                            reviewing the risks and benefits, the patient was  deemed in satisfactory condition to undergo the                            procedure.                           After obtaining informed consent, the colonoscope                            was passed under direct vision. Throughout the                            procedure, the patient's blood pressure, pulse, and                             oxygen  saturations were monitored continuously. The                            [id number] ([id number]) Colon was introduced through                            the anus and advanced to the the cecum, identified                            by appendiceal orifice and ileocecal valve. The                            colonoscopy was performed without difficulty. The                            patient tolerated the procedure well. The quality                            of the bowel preparation was evaluated using the                            BBPS [facility] Bowel Preparation Scale) with scores                            of: Right Colon = 3, Transverse Colon = 3 and Left                            Colon = 3 (entire mucosa seen well with no residual                            staining, small fragments of stool or opaque                            liquid). The total BBPS score equals 9. The                            ileocecal valve, appendiceal orifice, and rectum  were photographed. Scope In: 8:00:52 AM Scope Out: 8:23:45 AM Scope Withdrawal Time: 0 hours 15 minutes 19 seconds  Total Procedure Duration: 0 hours 22 minutes 53 seconds  Findings:      Hemorrhoids were found on perianal exam.      Two sessile polyps were found in the descending colon. The polyps were 4       to 6 mm in size. These polyps were removed with a cold snare. Resection       and retrieval were complete.      Non-bleeding external and internal hemorrhoids were found during       retroflexion. The hemorrhoids were medium-sized.      There is no endoscopic evidence of bleeding in the entire colon. Impression:               - Hemorrhoids found on perianal exam.                           - Two 4 to 6 mm polyps in the descending colon,                            removed with a cold snare. Resected and retrieved.                           - Non-bleeding external and internal hemorrhoids. Moderate Sedation:       Per Anesthesia Care Recommendation:           - Patient has a contact number available for                            emergencies. The signs and symptoms of potential                            delayed complications were discussed with the                            patient. Return to normal activities tomorrow.                            Written discharge instructions were provided to the                            patient.                           - Resume previous diet.                           - Continue present medications.                           - Await pathology results.                           - No repeat colonoscopy due to current age (27                            years  or older).                           - Return to GI clinic as previously scheduled.                           -Iron  deficiency anemia could be explained from                            gastritis seen with adherent blood                           -c/w iron  supplementation                           -May pursue capsule endoscopy if inadequate                            response to iron  supplementation in future                           -No absolute GI contraindication to restart                            clinically indicated anticoagulation/antiplatelet                            drugs while patient is closely monitored Procedure Code(s):        --- Professional ---                           [phone], Colonoscopy, flexible; with removal of                            tumor(s), polyp(s), or other lesion(s) by snare                            technique Diagnosis Code(s):        --- Professional ---                           D12.4, Benign neoplasm of descending colon                           K64.8, Other hemorrhoids                           D50.9, Iron  deficiency anemia, unspecified CPT copyright 2022 American Medical Association. All rights reserved. The codes documented in this report are preliminary and  upon coder review may  be revised to meet current compliance requirements. Deatrice Dine, MD Deatrice Dine, MD 12/27/2023 8:34:09 AM This report has been signed electronically. Number of Addenda: 0

## 2023-12-28 ENCOUNTER — Encounter (HOSPITAL_COMMUNITY): Payer: Self-pay | Admitting: Gastroenterology

## 2023-12-28 LAB — SURGICAL PATHOLOGY

## 2024-01-01 ENCOUNTER — Telehealth: Admitting: *Deleted

## 2024-01-02 ENCOUNTER — Encounter (INDEPENDENT_AMBULATORY_CARE_PROVIDER_SITE_OTHER): Payer: Self-pay | Admitting: Gastroenterology

## 2024-01-03 ENCOUNTER — Encounter (INDEPENDENT_AMBULATORY_CARE_PROVIDER_SITE_OTHER): Payer: Self-pay | Admitting: *Deleted

## 2024-01-03 ENCOUNTER — Other Ambulatory Visit: Payer: Self-pay | Admitting: *Deleted

## 2024-01-03 NOTE — Patient Outreach (Signed)
 Complex Care Management   Visit Note  02/07/2024 updated note for 01/03/24  Name:  Dawn Gonzales MRN: [id number] DOB: 07-29-1940  Situation: Referral received for Complex Care Management related to Heart Failure I obtained verbal consent from   Visit completed with  on the phone  Voiced under standing when Ultrasound reviewed asked questions Almarie simpson at [facility] follows her     Geni sent her a box of needed medicines in a 3 pack   like Velcro needed for her equipment    Background:   Past Medical History:  Diagnosis Date   Anemia    Anxiety    Arthritis    Bilateral renal cysts    Cholecystitis    Chronic diastolic heart failure (HCC)    Depression    Essential hypertension    History of cellulitis    History of CVA (cerebrovascular accident)    History of endocarditis    Hyperlipemia    Morbid obesity (HCC)    Neuropathy    Presence of permanent cardiac pacemaker    Right bundle branch block (RBBB)    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   26 mm Edwards Sapien 3 transcatheter heart valve placed via percutaneous right transfemoral approach    Severe aortic stenosis    Sleep apnea     Assessment: Patient Reported Symptoms:  Cognitive Cognitive Status: No symptoms reported      Neurological Neurological Review of Symptoms: No symptoms reported    HEENT HEENT Symptoms Reported: No symptoms reported      Cardiovascular Cardiovascular Symptoms Reported: No symptoms reported    Respiratory Respiratory Symptoms Reported: No symptoms reported    Endocrine Endocrine Symptoms Reported: No symptoms reported    Gastrointestinal Gastrointestinal Symptoms Reported: Abdominal pain or discomfort Additional Gastrointestinal Details: had some irritation of her stomach but doing better since she is taking medication Gastrointestinal Management Strategies: Adequate rest, Medication therapy, Diet modification, Fluid  modification Gastrointestinal Self-Management Outcome: 4 (good)    Genitourinary Genitourinary Symptoms Reported: Incontinence Additional Genitourinary Details: Reviewed recent Ultrasound- minimal changes and will continue to monitor Genitourinary Management Strategies: Medication therapy, Adequate rest, Coping strategies Genitourinary Self-Management Outcome: 4 (good)  Integumentary Integumentary Symptoms Reported: No symptoms reported Skin Self-Management Outcome: 4 (good)  Musculoskeletal Musculoskelatal Symptoms Reviewed: Difficulty walking, Limited mobility Musculoskeletal Management Strategies: Medical device, Medication therapy, Routine screening Musculoskeletal Self-Management Outcome: 3 (uncertain)      Psychosocial Psychosocial Symptoms Reported: No symptoms reported Behavioral Management Strategies: Support system Behavioral Health Self-Management Outcome: 4 (good)   Quality of Family Relationships: helpful, involved, supportive Do you feel physically threatened by others?: No    02/07/2024    PHQ2-9 Depression Screening   Little interest or pleasure in doing things Not at all  Feeling down, depressed, or hopeless Not at all  PHQ-2 - Total Score 0  Trouble falling or staying asleep, or sleeping too much    Feeling tired or having little energy    Poor appetite or overeating     Feeling bad about yourself - or that you are a failure or have let yourself or your family down    Trouble concentrating on things, such as reading the newspaper or watching television    Moving or speaking so slowly that other people could have noticed.  Or the opposite - being so fidgety or restless that you have been moving around a lot more than usual    Thoughts that you would be better off dead, or  hurting yourself in some way    PHQ2-9 Total Score    If you checked off any problems, how difficult have these problems made it for you to do your work, take care of things at home, or get along  with other people    Depression Interventions/Treatment      There were no vitals filed for this visit.  Medications Reviewed Today   Medications were not reviewed in this encounter     Recommendation:   PCP Follow-up  Follow Up Plan:   Telephone follow up appointment date/time:  02/22/24 1 pm  Laniah Grimm L. Ramonita, RN, BSN, CCM [place]  [facility], [facility] RN Care Manager Direct Dial: [phone]  Fax: [phone]

## 2024-01-04 DIAGNOSIS — G4733 Obstructive sleep apnea (adult) (pediatric): Secondary | ICD-10-CM | POA: Diagnosis not present

## 2024-01-04 DIAGNOSIS — G44009 Cluster headache syndrome, unspecified, not intractable: Secondary | ICD-10-CM | POA: Diagnosis not present

## 2024-01-07 ENCOUNTER — Ambulatory Visit: Payer: Medicare PPO

## 2024-01-07 DIAGNOSIS — I442 Atrioventricular block, complete: Secondary | ICD-10-CM | POA: Diagnosis not present

## 2024-01-08 LAB — CUP PACEART REMOTE DEVICE CHECK
Date Time Interrogation Session: 20250908111012
Implantable Lead Connection Status: 753985
Implantable Lead Connection Status: 753985
Implantable Lead Implant Date: 20200311
Implantable Lead Implant Date: 20200311
Implantable Lead Location: 753859
Implantable Lead Location: 753860
Implantable Lead Model: 377
Implantable Lead Model: 377
Implantable Lead Serial Number: 81024123
Implantable Lead Serial Number: 81075341
Implantable Pulse Generator Implant Date: 20200311
Pulse Gen Model: 407145
Pulse Gen Serial Number: 69550148

## 2024-01-10 ENCOUNTER — Ambulatory Visit (INDEPENDENT_AMBULATORY_CARE_PROVIDER_SITE_OTHER): Payer: Self-pay | Admitting: Gastroenterology

## 2024-01-11 NOTE — Progress Notes (Signed)
 Patient result letter mailed procedure note and pathology result faxed to PCP

## 2024-01-15 ENCOUNTER — Ambulatory Visit: Payer: Self-pay | Admitting: Internal Medicine

## 2024-01-17 NOTE — Progress Notes (Signed)
 Remote PPM Transmission

## 2024-01-18 ENCOUNTER — Other Ambulatory Visit (INDEPENDENT_AMBULATORY_CARE_PROVIDER_SITE_OTHER): Payer: Self-pay | Admitting: Gastroenterology

## 2024-02-03 DIAGNOSIS — G44009 Cluster headache syndrome, unspecified, not intractable: Secondary | ICD-10-CM | POA: Diagnosis not present

## 2024-02-07 NOTE — Patient Instructions (Addendum)
 Visit Information  Thank you for taking time to visit with me today. Please don't hesitate to contact me if I can be of assistance to you before our next scheduled appointment.  Your next care management appointment is by telephone on 02/22/24 at 1 pm with Hendricks Her RN CM     Please call the care guide team at [phone] if you need to cancel, schedule, or reschedule an appointment.   Please call the Suicide and Crisis Lifeline: 988 call the [place]  National Suicide Prevention Lifeline: (567)075-6939 or TTY: 367 753 8589 TTY 318 032 9342) to talk to a trained counselor call 1-800-273-TALK (toll free, 24 hour hotline) go to [facility] [address] [place] [phone]) call 911 if you are experiencing a Mental Health or Behavioral Health Crisis or need someone to talk to.   Kimarie Coor L. Ramonita, RN, BSN, CCM [place]  [facility], [facility] RN Care Manager Direct Dial: [phone]  Fax: [phone]

## 2024-02-13 DIAGNOSIS — G4730 Sleep apnea, unspecified: Secondary | ICD-10-CM | POA: Diagnosis not present

## 2024-02-14 ENCOUNTER — Inpatient Hospital Stay: Attending: Oncology

## 2024-02-14 ENCOUNTER — Ambulatory Visit: Payer: Self-pay | Admitting: Oncology

## 2024-02-14 DIAGNOSIS — E785 Hyperlipidemia, unspecified: Secondary | ICD-10-CM | POA: Diagnosis not present

## 2024-02-14 DIAGNOSIS — I4891 Unspecified atrial fibrillation: Secondary | ICD-10-CM | POA: Diagnosis not present

## 2024-02-14 DIAGNOSIS — Z8679 Personal history of other diseases of the circulatory system: Secondary | ICD-10-CM | POA: Diagnosis not present

## 2024-02-14 DIAGNOSIS — Z7901 Long term (current) use of anticoagulants: Secondary | ICD-10-CM | POA: Diagnosis not present

## 2024-02-14 DIAGNOSIS — D509 Iron deficiency anemia, unspecified: Secondary | ICD-10-CM | POA: Diagnosis not present

## 2024-02-14 DIAGNOSIS — K2970 Gastritis, unspecified, without bleeding: Secondary | ICD-10-CM | POA: Diagnosis not present

## 2024-02-14 DIAGNOSIS — Z9981 Dependence on supplemental oxygen: Secondary | ICD-10-CM | POA: Insufficient documentation

## 2024-02-14 DIAGNOSIS — I130 Hypertensive heart and chronic kidney disease with heart failure and stage 1 through stage 4 chronic kidney disease, or unspecified chronic kidney disease: Secondary | ICD-10-CM | POA: Diagnosis not present

## 2024-02-14 DIAGNOSIS — R0602 Shortness of breath: Secondary | ICD-10-CM | POA: Diagnosis not present

## 2024-02-14 DIAGNOSIS — K644 Residual hemorrhoidal skin tags: Secondary | ICD-10-CM | POA: Diagnosis not present

## 2024-02-14 DIAGNOSIS — I509 Heart failure, unspecified: Secondary | ICD-10-CM | POA: Insufficient documentation

## 2024-02-14 DIAGNOSIS — Z79899 Other long term (current) drug therapy: Secondary | ICD-10-CM | POA: Insufficient documentation

## 2024-02-14 DIAGNOSIS — K648 Other hemorrhoids: Secondary | ICD-10-CM | POA: Diagnosis not present

## 2024-02-14 DIAGNOSIS — G4730 Sleep apnea, unspecified: Secondary | ICD-10-CM | POA: Insufficient documentation

## 2024-02-14 DIAGNOSIS — N1831 Chronic kidney disease, stage 3a: Secondary | ICD-10-CM | POA: Diagnosis not present

## 2024-02-14 DIAGNOSIS — D649 Anemia, unspecified: Secondary | ICD-10-CM

## 2024-02-14 DIAGNOSIS — K635 Polyp of colon: Secondary | ICD-10-CM | POA: Diagnosis not present

## 2024-02-14 LAB — CBC
HCT: 37.8 % (ref 36.0–46.0)
Hemoglobin: 11.6 g/dL — ABNORMAL LOW (ref 12.0–15.0)
MCH: 29.5 pg (ref 26.0–34.0)
MCHC: 30.7 g/dL (ref 30.0–36.0)
MCV: 96.2 fL (ref 80.0–100.0)
Platelets: 251 K/uL (ref 150–400)
RBC: 3.93 MIL/uL (ref 3.87–5.11)
RDW: 15.2 % (ref 11.5–15.5)
WBC: 7.5 K/uL (ref 4.0–10.5)
nRBC: 0 % (ref 0.0–0.2)

## 2024-02-14 LAB — IRON AND TIBC
Iron: 30 ug/dL (ref 28–170)
Saturation Ratios: 9 % — ABNORMAL LOW (ref 10.4–31.8)
TIBC: 335 ug/dL (ref 250–450)
UIBC: 304 ug/dL

## 2024-02-14 LAB — FERRITIN: Ferritin: 165 ng/mL (ref 11–307)

## 2024-02-14 LAB — VITAMIN B12: Vitamin B-12: 448 pg/mL (ref 180–914)

## 2024-02-14 NOTE — Progress Notes (Signed)
 Patient's iron  saturations are still low.  She would benefit from additional IV iron .  Would you see if she is agreeable and schedule her?  Delon Hope, NP 02/14/2024 3:07 PM

## 2024-02-19 ENCOUNTER — Other Ambulatory Visit: Payer: Self-pay | Admitting: Oncology

## 2024-02-21 LAB — METHYLMALONIC ACID, SERUM: Methylmalonic Acid, Quantitative: 498 nmol/L — ABNORMAL HIGH (ref 0–378)

## 2024-02-22 ENCOUNTER — Other Ambulatory Visit: Payer: Self-pay

## 2024-02-22 ENCOUNTER — Inpatient Hospital Stay

## 2024-02-22 ENCOUNTER — Inpatient Hospital Stay: Admitting: Oncology

## 2024-02-22 VITALS — BP 137/58 | HR 59 | Temp 96.2°F | Resp 20

## 2024-02-22 DIAGNOSIS — E611 Iron deficiency: Secondary | ICD-10-CM

## 2024-02-22 DIAGNOSIS — D649 Anemia, unspecified: Secondary | ICD-10-CM | POA: Diagnosis not present

## 2024-02-22 DIAGNOSIS — K2970 Gastritis, unspecified, without bleeding: Secondary | ICD-10-CM | POA: Diagnosis not present

## 2024-02-22 DIAGNOSIS — Z9981 Dependence on supplemental oxygen: Secondary | ICD-10-CM | POA: Diagnosis not present

## 2024-02-22 DIAGNOSIS — G4730 Sleep apnea, unspecified: Secondary | ICD-10-CM | POA: Diagnosis not present

## 2024-02-22 DIAGNOSIS — R0602 Shortness of breath: Secondary | ICD-10-CM | POA: Diagnosis not present

## 2024-02-22 DIAGNOSIS — K648 Other hemorrhoids: Secondary | ICD-10-CM | POA: Diagnosis not present

## 2024-02-22 DIAGNOSIS — K644 Residual hemorrhoidal skin tags: Secondary | ICD-10-CM | POA: Diagnosis not present

## 2024-02-22 DIAGNOSIS — D509 Iron deficiency anemia, unspecified: Secondary | ICD-10-CM | POA: Diagnosis not present

## 2024-02-22 DIAGNOSIS — N1831 Chronic kidney disease, stage 3a: Secondary | ICD-10-CM | POA: Diagnosis not present

## 2024-02-22 MED ORDER — METHYLPREDNISOLONE SODIUM SUCC 125 MG IJ SOLR
125.0000 mg | Freq: Once | INTRAMUSCULAR | Status: AC
  Administered 2024-02-22: 125 mg via INTRAVENOUS
  Filled 2024-02-22: qty 2

## 2024-02-22 MED ORDER — CETIRIZINE HCL 10 MG/ML IV SOLN
10.0000 mg | Freq: Once | INTRAVENOUS | Status: AC
  Administered 2024-02-22: 10 mg via INTRAVENOUS
  Filled 2024-02-22: qty 1

## 2024-02-22 MED ORDER — FAMOTIDINE IN NACL 20-0.9 MG/50ML-% IV SOLN
20.0000 mg | Freq: Once | INTRAVENOUS | Status: AC
  Administered 2024-02-22: 20 mg via INTRAVENOUS
  Filled 2024-02-22: qty 50

## 2024-02-22 MED ORDER — SODIUM CHLORIDE 0.9 % IV SOLN: Freq: Once | INTRAVENOUS | Status: AC

## 2024-02-22 MED ORDER — SODIUM CHLORIDE 0.9 % IV SOLN
1000.0000 mg | Freq: Once | INTRAVENOUS | Status: AC
  Administered 2024-02-22: 1000 mg via INTRAVENOUS
  Filled 2024-02-22: qty 20

## 2024-02-22 MED ORDER — ACETAMINOPHEN 325 MG PO TABS
650.0000 mg | ORAL_TABLET | Freq: Once | ORAL | Status: AC
  Administered 2024-02-22: 650 mg via ORAL
  Filled 2024-02-22: qty 2

## 2024-02-22 NOTE — Progress Notes (Signed)
 [facility] OFFICE PROGRESS NOTE  Dawn Norleen PEDLAR, MD  ASSESSMENT & PLAN:  Assessment & Plan  1.  Anemia. Patient presented with macrocytic anemia and workup showed demonstrated iron  deficiency with a percent saturation of 7% and ferritin level of 37.  The TIBC was elevated at 429.  SPEP was negative for M spike and immunofixation was unremarkable.  There was no evidence of hemolysis, MMA was elevated at 419, copper  level was normal.  EGD and colonoscopy were performed on 12/27/2023.  The EGD showed a normal esophagus, gastritis, normal duodenal bulb and second portion of the duodenum.  Colonoscopy showed hemorrhoids found on perianal exam, two 4 to 6 mm polyps in the descending colon, nonbleeding external and internal hemorrhoids.  Repeat labs performed on 02/14/2024 show mild anemia which is overall improved.  Her iron  studies are improving but percent saturation remains low.  She is receiving a dose of INFeD  today.  2.  Elevated methylmalonic acid level. The patient has a normal vitamin B12 level but elevated methylmalonic acid level.  The MMA level has been persistently elevated.  Recommend taking oral vitamin B12.  Recheck methylmalonic acid level at next visit.  Follow-up: Office visit in 12 weeks.  Labs prior to visit.  INTERVAL HISTORY: The patient is seen today for routine follow-up.  She is seen in infusion as she is receiving a dose of INFeD  today.  Lab work was completed on 02/14/2024 which showed a WBC of 7.5, hemoglobin 11.6, MCV 96.2, platelets 251,000, ferritin 165, iron  30, TIBC 335, percent saturation 9%, vitamin B12 of 448, methylmalonic acid level of 498.  The patient reports that she is overall feeling well.  Her energy level is good.  She is not having any bleeding that she is noted.  Denies chest pain.  Has her baseline shortness of breath.  She wears oxygen  typically at 2 L/min but sometimes increases to as high as 4 L/min depending on what she is doing.  She  is not currently on any anticoagulation or aspirin .  SUMMARY OF HEMATOLOGIC HISTORY:  Dawn Gonzales is a 83 y.o. female presenting to clinic today for evaluation of anemia at the request of Hyacinth Honey, NP.  Patient has a medical history of CKD stage 3a, hypertension, CHF, A-fib, atherosclerosis of aorta, aneurysm of thoracic aorta, restrictive lung disease, sleep apnea, hyperlipidemia, vitamin D  deficiency, polyneuropathy, and idiopathic peripheral neuropathy.   Dawn Gonzales was seen by [facility] NP on 10/09/23 for same day labs. His most recent CBC diff from 10/09/23 showed low RBC at 2.53, low HGB at 7.7, low HCT at 26.1, elevated MCV at 103, and elevated MCHC at 29.5. Folate from 07/21/23 was normal being over 40.0. Vitamin B12 from 07/21/23 was normal at 420.   Last colonoscopy was back in [place] city prior to moving down in 2019.  She was started on Eliquis  on 08/01/2023 and hemoglobin dropped on 10/12/2023 to as low as 7.7.  She denied any bright red blood per rectum/melena.  No ice pica.  She has been on iron  tablets for years.  Review of Systems  Constitutional: Negative.  Negative for chills, fever and malaise/fatigue.  HENT: Negative.    Eyes: Negative.   Respiratory:  Positive for shortness of breath. Negative for cough.        Shortness of breath consistent with baseline  Cardiovascular: Negative.  Negative for chest pain and leg swelling.  Gastrointestinal: Negative.  Negative for abdominal pain, nausea and vomiting.  Genitourinary: Negative.  Skin: Negative.   Neurological: Negative.   Endo/Heme/Allergies: Negative.  Does not bruise/bleed easily.  Psychiatric/Behavioral: Negative.     Vital signs: Temp 96.2, pulse 59, respirations 20, O2 sat 90% on O2 at 3 L, blood pressure 137/58  Physical Exam Vitals reviewed.  Constitutional:      General: She is not in acute distress.    Appearance: Normal appearance.  HENT:     Head: Normocephalic.  Eyes:     General: No scleral icterus.     Conjunctiva/sclera: Conjunctivae normal.  Cardiovascular:     Rate and Rhythm: Normal rate and regular rhythm.  Pulmonary:     Effort: Pulmonary effort is normal. No respiratory distress.     Breath sounds: Wheezing present.  Abdominal:     General: Bowel sounds are normal. There is no distension.     Palpations: Abdomen is soft.  Musculoskeletal:        General: No swelling. Normal range of motion.     Right lower leg: No edema.     Left lower leg: No edema.  Lymphadenopathy:     Cervical: No cervical adenopathy.  Skin:    General: Skin is warm and dry.  Neurological:     Mental Status: She is alert and oriented to person, place, and time. Mental status is at baseline.  Psychiatric:        Mood and Affect: Mood normal.        Behavior: Behavior normal.        Thought Content: Thought content normal.        Judgment: Judgment normal.     CBC    Component Value Date/Time   WBC 7.5 02/14/2024 1239   RBC 3.93 02/14/2024 1239   HGB 11.6 (L) 02/14/2024 1239   HCT 37.8 02/14/2024 1239   PLT 251 02/14/2024 1239   MCV 96.2 02/14/2024 1239   MCH 29.5 02/14/2024 1239   MCHC 30.7 02/14/2024 1239   RDW 15.2 02/14/2024 1239   LYMPHSABS 1.0 10/17/2023 0909   MONOABS 0.5 10/17/2023 0909   EOSABS 0.2 10/17/2023 0909   BASOSABS 0.0 10/17/2023 0909     I spent 30 minutes dedicated to the care of this patient (face-to-face and non-face-to-face) on the date of the encounter to include what is described in the assessment and plan.,  Anne Boltz, NP 02/22/2024 9:33 AM

## 2024-02-22 NOTE — Progress Notes (Signed)
 Patient tolerated iron infusion with no complaints voiced.  Peripheral IV site clean and dry with good blood return noted before and after infusion.  Band aid applied. Pt observed for 30 minutes post iron without any complications.  VSS with discharge and left in satisfactory condition with no s/s of distress noted. All follow ups as scheduled.   Sherika Kubicki

## 2024-02-22 NOTE — Patient Instructions (Signed)
 Iron Dextran Injection What is this medication? IRON DEXTRAN (EYE ern DEX tran) treats low levels of iron in your body. Iron is a mineral that plays an important role in making red blood cells, which carry oxygen from your lungs to the rest of your body. This medicine may be used for other purposes; ask your health care provider or pharmacist if you have questions. COMMON BRAND NAME(S): Dexferrum, INFeD What should I tell my care team before I take this medication? They need to know if you have any of these conditions: Anemia not caused by low iron levels Heart disease High levels of iron in the blood Kidney disease Liver disease An unusual or allergic reaction to iron, other medications, foods, dyes, or preservatives Pregnant or trying to get pregnant Breastfeeding How should I use this medication? This medication is injected into a vein or a muscle. It is given by your care team in a hospital or clinic setting. Talk to your care team about the use of this medication in children. While it may be prescribed for children as young as 4 months for selected conditions, precautions do apply. Overdosage: If you think you have taken too much of this medicine contact a poison control center or emergency room at once. NOTE: This medicine is only for you. Do not share this medicine with others. What if I miss a dose? Keep appointments for follow-up doses. It is important not to miss your dose. Call your care team if you are unable to keep an appointment. What may interact with this medication? Do not take this medication with any of the following: Deferoxamine Dimercaprol Other iron products This medication may also interact with the following: Chloramphenicol Deferasirox This list may not describe all possible interactions. Give your health care provider a list of all the medicines, herbs, non-prescription drugs, or dietary supplements you use. Also tell them if you smoke, drink alcohol, or use  illegal drugs. Some items may interact with your medicine. What should I watch for while using this medication? Visit your care team for regular checks on your progress. Tell your care team if your symptoms do not start to get better or if they get worse. You may need blood work while taking this medication. You may need to eat more foods that contain iron. Talk to your care team. Foods that contain iron include whole grains/cereals, dried fruits, beans, peas, leafy green vegetables, and organ meats (liver, kidney). Long-term use of this medication may increase your risk of some cancers. Talk to your care team about your risk of cancer. What side effects may I notice from receiving this medication? Side effects that you should report to your care team as soon as possible: Allergic reactions--skin rash, itching, hives, swelling of the face, lips, tongue, or throat Low blood pressure--dizziness, feeling faint or lightheaded, blurry vision Shortness of breath Side effects that usually do not require medical attention (report to your care team if they continue or are bothersome): Flushing Headache Joint pain Muscle pain Nausea Pain, redness, or irritation at injection site This list may not describe all possible side effects. Call your doctor for medical advice about side effects. You may report side effects to FDA at 1-800-FDA-1088. Where should I keep my medication? This medication is given in a hospital or clinic. It will not be stored at home. NOTE: This sheet is a summary. It may not cover all possible information. If you have questions about this medicine, talk to your doctor, pharmacist, or health  care provider.  2024 Elsevier/Gold Standard (2022-12-06 00:00:00)

## 2024-02-22 NOTE — Patient Instructions (Signed)
 Visit Information  Thank you for taking time to visit with me today. Please don't hesitate to contact me if I can be of assistance to you before our next scheduled appointment.  Your next care management appointment is by telephone on 03-24-2024 at 1:00 PM   Telephone follow-up in 1 month  Please call the care guide team at [phone] if you need to cancel, schedule, or reschedule an appointment.   Please call the Suicide and Crisis Lifeline: 988 call the [place]  National Suicide Prevention Lifeline: 717-384-9230 or TTY: (571) 534-4401 TTY [phone]) to talk to a trained counselor call 1-800-273-TALK (toll free, 24 hour hotline) go to [facility] [address] [place] [phone]) call 911 if you are experiencing a Mental Health or Behavioral Health Crisis or need someone to talk to.  Hendricks Her RN, BSN  [place] [facility] RN Case Information systems manager [phone]

## 2024-02-26 ENCOUNTER — Ambulatory Visit (INDEPENDENT_AMBULATORY_CARE_PROVIDER_SITE_OTHER): Admitting: Gastroenterology

## 2024-03-05 DIAGNOSIS — G44009 Cluster headache syndrome, unspecified, not intractable: Secondary | ICD-10-CM | POA: Diagnosis not present

## 2024-03-07 ENCOUNTER — Encounter (INDEPENDENT_AMBULATORY_CARE_PROVIDER_SITE_OTHER): Payer: Self-pay | Admitting: Gastroenterology

## 2024-03-07 ENCOUNTER — Ambulatory Visit (INDEPENDENT_AMBULATORY_CARE_PROVIDER_SITE_OTHER): Admitting: Gastroenterology

## 2024-03-07 VITALS — BP 110/70 | HR 60 | Temp 97.2°F | Ht 66.0 in

## 2024-03-07 DIAGNOSIS — K5904 Chronic idiopathic constipation: Secondary | ICD-10-CM | POA: Diagnosis not present

## 2024-03-07 DIAGNOSIS — Z6841 Body Mass Index (BMI) 40.0 and over, adult: Secondary | ICD-10-CM | POA: Diagnosis not present

## 2024-03-07 DIAGNOSIS — D500 Iron deficiency anemia secondary to blood loss (chronic): Secondary | ICD-10-CM | POA: Diagnosis not present

## 2024-03-07 NOTE — Patient Instructions (Signed)
 It was very nice to meet you today, as dicussed with will plan for the following :  1) continue getting iron  with hematology  2) see you in 5-6 months

## 2024-03-07 NOTE — Progress Notes (Signed)
 Abed Schar Faizan Kaylum Shrum , M.D. Gastroenterology & Hepatology [facility] [address] [place], [place] [address] Primary Care Physician: [name], MD [address] [place] [address]  Chief Complaint: Iron  deficiency anemia, constipation  History of Present Illness: Dawn Gonzales is a 83 y.o. 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  Biopsies negative for H. pylori.  Patient has been seen hematology and getting IV iron  infusion  Patient has no active complaint reports her heartburn has significantly improved with taking Protonix .  She is having regular bowel movement by adding more fiber to the diet.  Previously she was having constipation  Patient is a complain with daughter at the clinic [facility]) .  Patient reports her bowel movements are usually pebble-like small and hard 2-3 times daily.  Denies seeing black or stools of fresh blood.The patient denies having any nausea, vomiting, fever, chills, hematochezia, melena, hematemesis, abdominal distention, abdominal pain, diarrhea, jaundice, pruritus or weight loss.     Labs from 06/25 hemoglobin 8.7 baseline 11-12 MCV 106 Iron  saturation 7% ferritin 37  Labs from 02/14/2024 hemoglobin 11.6 Ferritin 165 iron  saturation 9%   Last EGD:11/2023  - Normal esophagus. - Gastritis. Biopsied. - Normal duodenal bulb and second portion of the  duodenum.   2019 during ERCP Last Colonoscopy:11/2023  - Hemorrhoids found on perianal exam. - Two 4 to 6 mm polyps in the descending colon, removed with a cold snare. Resected and retrieved. - Non- bleeding external and internal hemorrhoids.  A. STOMACH BIOPSY:  - Gastric antral and oxyntic mucosa with features of reactive  gastropathy  - Negative for H. pylori on HE stain  - Negative for intestinal metaplasia or malignancy   B. COLON DESCENDING POLYPECTOMY:  - Hyperplastic polyp.  - No dysplasia or malignancy.   colonoscopy : Dr. Waymond of [place], [place] with removal of few small polyps before 2018  FHx: smokes cigarettes about 10 years but quit in 1980  Social: neg smoking, alcohol or illicit drug use Surgical: no abdominal surgeries  Past Medical History: Past Medical History:  Diagnosis Date   Anemia    Anxiety    Arthritis    Bilateral renal cysts    Cholecystitis    Chronic diastolic heart failure (HCC)    Depression    Essential hypertension    History of cellulitis    History of CVA (cerebrovascular accident)    History of endocarditis    Hyperlipemia    Morbid obesity (HCC)    Neuropathy    Presence of permanent cardiac pacemaker    Right bundle branch block (RBBB)    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   26 mm Edwards Sapien 3 transcatheter heart valve placed via percutaneous right transfemoral approach    Severe aortic stenosis    Sleep apnea     Past Surgical History: Past Surgical History:  Procedure Laterality Date   Abdominal gangrene     ADENOIDECTOMY     Cataract surgery  COLONOSCOPY     COLONOSCOPY N/A 12/27/2023   Procedure: COLONOSCOPY;  Surgeon: Cinderella Deatrice FALCON, MD;  [facility];  Service: Endoscopy;  Laterality: N/A;  730am, asa 3   ENDOSCOPIC RETROGRADE CHOLANGIOPANCREATOGRAPHY (ERCP) WITH PROPOFOL  N/A 02/08/2018   Procedure: ENDOSCOPIC RETROGRADE CHOLANGIOPANCREATOGRAPHY (ERCP) WITH PROPOFOL ;   Surgeon: Wilhelmenia Aloha Raddle., MD;  Location: [facility] ENDOSCOPY;  Service: Gastroenterology;  Laterality: N/A;   ESOPHAGOGASTRODUODENOSCOPY N/A 12/27/2023   Procedure: EGD (ESOPHAGOGASTRODUODENOSCOPY);  Surgeon: Cinderella Deatrice FALCON, MD;  Location: [facility];  Service: Endoscopy;  Laterality: N/A;   EUS  02/08/2018   Procedure: UPPER ENDOSCOPIC ULTRASOUND (EUS) LINEAR;  Surgeon: Wilhelmenia Aloha Raddle., MD;  Location: [facility] ENDOSCOPY;  Service: Gastroenterology;;   EYE SURGERY     IR FLUORO RM 30-60 MIN  03/27/2018   IR PERC CHOLECYSTOSTOMY  02/09/2018   IR RADIOLOGIST EVAL & MGMT  03/26/2018   IR RADIOLOGY PERIPHERAL GUIDED IV START  05/22/2018   IR RADIOLOGY PERIPHERAL GUIDED IV START  05/30/2018   IR RADIOLOGY PERIPHERAL GUIDED IV START  06/05/2018   IR US  GUIDE VASC ACCESS RIGHT  05/22/2018   IR US  GUIDE VASC ACCESS RIGHT  05/30/2018   IR US  GUIDE VASC ACCESS RIGHT  06/05/2018   PACEMAKER IMPLANT N/A 07/10/2018   Procedure: PACEMAKER IMPLANT;  Surgeon: Waddell Danelle ORN, MD;  Location: [facility] INVASIVE CV LAB;  Service: Cardiovascular;  Laterality: N/A;   REMOVAL OF STONES  02/08/2018   Procedure: REMOVAL OF STONES;  Surgeon: Wilhelmenia Aloha Raddle., MD;  Location: [facility] ENDOSCOPY;  Service: Gastroenterology;;   ANNETT  02/08/2018   Procedure: ANNETT;  Surgeon: Mansouraty, Aloha Raddle., MD;  Location: [facility] ENDOSCOPY;  Service: Gastroenterology;;   TONSILLECTOMY     TOOTH EXTRACTION N/A 06/18/2018   Procedure: DENTAL RESTORATION/EXTRACTIONS;  Surgeon: Sheryle Hamilton, DDS;  Location[facility] OR;  Service: Oral Surgery;  Laterality: N/A;   TRANSCATHETER AORTIC VALVE REPLACEMENT, TRANSFEMORAL N/A 07/09/2018   Procedure: TRANSCATHETER AORTIC VALVE REPLACEMENT, TRANSFEMORAL;  Surgeon: Wonda Sharper, MD;  Location: [facility] INVASIVE CV LAB;  Service: Cardiovascular;  Laterality: N/A;   Tummy tuck      Family History: Family History  Problem Relation Age of Onset   Cancer Mother    Hypertension Father     Arthritis Father    Heart attack Father     Social History: Social History   Tobacco Use  Smoking Status Former   Current packs/day: 0.00   Types: Cigarettes   Quit date: 05/23/1978   Years since quitting: 45.8   Passive exposure: Never  Smokeless Tobacco Never   Social History   Substance and Sexual Activity  Alcohol Use Not Currently   Comment: Occasional   Social History   Substance and Sexual Activity  Drug Use Never    Allergies: No Known Allergies  Medications: Current Outpatient Medications  Medication Sig Dispense Refill   acetaminophen  (TYLENOL ) 325 MG tablet Take 2 tablets (650 mg total) by mouth every 6 (six) hours as needed for mild pain, fever or headache (or Fever >/= 101). 12 tablet 0   albuterol  (VENTOLIN  HFA) 108 (90 Base) MCG/ACT inhaler Inhale 2 puffs every 4 hours by inhalation route.     Calcium  Carbonate (CALCIUM -CARB 600 PO) Take 600 mg by mouth daily.     cholecalciferol  (VITAMIN D ) 1000 units tablet Take 1,000 Units by mouth daily.     Cyanocobalamin (B-12 PO) Take by mouth.     dextromethorphan -guaiFENesin  (MUCINEX  DM) 30-600 MG 12hr tablet Take 1 tablet by  mouth 2 (two) times daily. 20 tablet 0   ferrous sulfate  325 (65 FE) MG tablet Take 325 mg by mouth daily with breakfast.     gabapentin  (NEURONTIN ) 300 MG capsule Take 300 mg by mouth 2 (two) times daily.     ipratropium-albuterol  (DUONEB) 0.5-2.5 (3) MG/3ML SOLN Take 3 mLs by nebulization 2 (two) times daily. 360 mL 0   metoprolol  tartrate 37.5 MG TABS Take 1 tablet (37.5 mg total) by mouth 2 (two) times daily. 60 tablet 1   Multiple Vitamin (MULTIVITAMIN WITH MINERALS) TABS tablet Take 1 tablet by mouth daily.     OXYGEN  Inhale 3 L into the lungs. With long tube mostly 1-1.5     pantoprazole  (PROTONIX ) 40 MG tablet TAKE 1 TABLET BY MOUTH EVERY DAY 90 tablet 1   polyethylene glycol powder (GLYCOLAX /MIRALAX ) 17 GM/SCOOP powder Take 8.5 g by mouth daily.     potassium chloride  (KLOR-CON ) 10  MEQ tablet Take 1 tablet (10 mEq total) by mouth daily. Take While taking Demadex /Torsemide  30 tablet 2   pravastatin  (PRAVACHOL ) 20 MG tablet Take 20 mg by mouth daily.     silver sulfADIAZINE (SILVADENE) 1 % cream Apply 1 Application topically daily as needed (wound).     torsemide  (DEMADEX ) 20 MG tablet Take 2 tablets (40 mg total) by mouth 2 (two) times daily. 360 tablet 2   traMADol  (ULTRAM ) 50 MG tablet Take 50 mg by mouth every 6 (six) hours as needed. for pain     Turmeric (QC TUMERIC COMPLEX) 500 MG CAPS Take 1 capsule by mouth at bedtime.     venlafaxine  (EFFEXOR ) 37.5 MG tablet Take 37.5 mg by mouth daily.     Vibegron  (GEMTESA ) 75 MG TABS Take 1 tablet (75 mg total) by mouth daily. 30 tablet 11   apixaban  (ELIQUIS ) 5 MG TABS tablet Take 1 tablet (5 mg total) by mouth 2 (two) times daily. (Patient not taking: Reported on 03/07/2024) 60 tablet 6   clotrimazole-betamethasone  (LOTRISONE) cream Apply 1 Application topically. (Patient not taking: Reported on 03/07/2024)     No current facility-administered medications for this visit.    Review of Systems: GENERAL: negative for malaise, night sweats HEENT: No changes in hearing or vision, no nose bleeds or other nasal problems. NECK: Negative for lumps, goiter, pain and significant neck swelling RESPIRATORY: Negative for cough, wheezing CARDIOVASCULAR: Negative for chest pain, leg swelling, palpitations, orthopnea GI: SEE HPI MUSCULOSKELETAL: Negative for joint pain or swelling, back pain, and muscle pain. SKIN: Negative for lesions, rash HEMATOLOGY Negative for prolonged bleeding, bruising easily, and swollen nodes. ENDOCRINE: Negative for cold or heat intolerance, polyuria, polydipsia and goiter. NEURO: negative for tremor, gait imbalance, syncope and seizures. The remainder of the review of systems is noncontributory.   Physical Exam: BP 110/70   Pulse 60   Temp (!) 97.2 F (36.2 C)   Ht 5' 6 (1.676 m)   BMI 43.58 kg/m   GENERAL: The patient is AO x3, in no acute distress. HEENT: Head is normocephalic and atraumatic. EOMI are intact. Mouth is well hydrated and without lesions. NECK: Supple. No masses LUNGS: Clear to auscultation. No presence of rhonchi/wheezing/rales. Adequate chest expansion HEART: RRR, normal s1 and s2. ABDOMEN: Soft, nontender, no guarding, no peritoneal signs, and nondistended. BS +. No masses.  Imaging/Labs: as above     Latest Ref Rng & Units 02/14/2024   12:39 PM 11/22/2023   10:33 AM 10/17/2023    9:09 AM  CBC  WBC 4.0 -  10.5 K/uL 7.5  6.9  5.7   Hemoglobin 12.0 - 15.0 g/dL 88.3  88.6  8.7   Hematocrit 36.0 - 46.0 % 37.8  38.3  29.6   Platelets 150 - 400 K/uL 251  212  257    Lab Results  Component Value Date   IRON  30 02/14/2024   TIBC 335 02/14/2024   FERRITIN 165 02/14/2024    I personally reviewed and interpreted the available labs, imaging and endoscopic files.  Impression and Plan:  CRYSTALLE POPWELL is a 83 y.o. female with multiple medical problems including coronary artery disease history of CVA, status post aortic valve replacement obesity history of cholecystitis treated with cholecystostomy on 2 occasions status post ERCP with sphincterotomy and stone extraction in October 2019 , Afib on eliquis  ( currently held because of acute anemia) , restrictive lung disease on continuous oxygen  (1.5L -4L)  who presents for evaluation of iron  deficiency anemia and constipation   #Iron  deficiency anemia #Gastritis  Patient recently had upper endoscopy and colonoscopy.  Upper endoscopy with gastritis and adherent hematin. Colonoscopy dimunitive polyps removed  Recent labs with improvement in hemoglobin from 8.7-11.6  Continues to be iron  deficient and was recently given IV iron  infusion   Iron  deficiency anemia could be explained from gastritis seen with adherent blood  Recs:  - continue PPI   - Avoid NSAIDs   - c/ w iron  supplementation   - May pursue  capsule endoscopy if inadequate response to iron  supplementation in future   - No absolute GI contraindication to restart clinically indicated anticoagulation/ antiplatelet drugs while patient is closely monitored  #Constipation Ensure adequate fluid intake: Aim for 8 glasses of water daily. Follow a high fiber diet: Include foods such as dates, prunes, pears, and kiwi. Continue with miralax  and metamucil    All questions were answered.      Mustafa Potts Faizan Audrina Marten, MD Gastroenterology and Hepatology [facility] Gastroenterology   This chart has been completed using [facility] Dictation software, and while attempts have been made to ensure accuracy , certain words and phrases may not be transcribed as intended

## 2024-03-19 ENCOUNTER — Encounter: Payer: Self-pay | Admitting: Cardiology

## 2024-03-19 ENCOUNTER — Ambulatory Visit: Attending: Cardiology | Admitting: Cardiology

## 2024-03-19 VITALS — BP 132/78 | HR 62 | Ht 66.0 in | Wt 270.0 lb

## 2024-03-19 DIAGNOSIS — Z952 Presence of prosthetic heart valve: Secondary | ICD-10-CM | POA: Diagnosis not present

## 2024-03-19 DIAGNOSIS — I484 Atypical atrial flutter: Secondary | ICD-10-CM | POA: Diagnosis not present

## 2024-03-19 DIAGNOSIS — I5032 Chronic diastolic (congestive) heart failure: Secondary | ICD-10-CM

## 2024-03-19 NOTE — Progress Notes (Signed)
    Cardiology Office Note  Date: 03/19/2024   ID: Dawn Gonzales, DOB 07/07/1940, MRN [id number]  History of Present Illness: Dawn Gonzales is an 83 y.o. female last seen in July.  I reviewed the chart including interval evaluation by hematology and gastroenterology.  She is here today with her daughter for follow-up.  She does not report any definite sense of palpitations.  No obvious blood in her stools or change in consistency.  She has follow-up lab work per hematology next month and then a visit in January.  She has responded to iron  infusions with improvement in hemoglobin up to 11.6 most recently in October.  We did talk about the possibility of a trial of Eliquis  at 2.5 mg twice daily if the situation continues to improve through January.  Biotronik pacemaker in place with followed by Dr. Waddell.  Device interrogation in September revealed normal function.  I went over the remainder of her medications which are otherwise stable from a cardiac perspective.  Physical Exam: VS:  BP 132/78 (BP Location: Left Wrist)   Pulse 62   Ht 5' 6 (1.676 m)   Wt 270 lb (122.5 kg)   SpO2 93%   BMI 43.58 kg/m , BMI Body mass index is 43.58 kg/m.  Wt Readings from Last 3 Encounters:  03/19/24 270 lb (122.5 kg)  12/27/23 270 lb (122.5 kg)  12/05/23 270 lb (122.5 kg)    General: Patient appears comfortable at rest. HEENT: Conjunctiva and lids normal. Neck: Supple, no elevated JVP or carotid bruits. Lungs: Decreased breath sounds, nonlabored breathing at rest. Cardiac: Regular rate and rhythm, no S3, 2/6 systolic murmur. Extremities: Chronic edema with venous stasis.  ECG:  An ECG dated 08/28/2023 was personally reviewed today and demonstrated:  Sinus rhythm with right bundle branch block and leftward axis, nonspecific T wave changes.  Labwork: 07/21/2023: ALT 19; AST 23; TSH 3.994 07/23/2023: Magnesium  2.6 12/25/2023: BUN 36; Creatinine, Ser 0.75; Potassium 3.9; Sodium  142 02/14/2024: Hemoglobin 11.6; Platelets 251  June 2025: Cholesterol 127, triglycerides 108, HDL 42, LDL 65  Other Studies Reviewed Today:  No interval cardiac testing for review today.  Assessment and Plan:  1.  History of aortic stenosis status post 26 mm Edwards SAPIEN 3 TAVR in March 2020.  Echocardiogram March revealed stable prosthetic function with mean AV gradient 7.7 mmHg and trivial aortic regurgitation.  No change in cardiac murmur.   2.  Primary hypertension.  Blood pressure control reasonable today.  Continue metoprolol  37.5 mg twice daily.   3.  Mixed hyperlipidemia.  Continue Pravachol  20 mg daily.  LDL 65 in June.   4.  HFpEF, LVEF 55 to 60% and RV contraction normal by echocardiogram in March.  Weight has been stable.  She is on Demadex  40 mg twice daily with potassium supplement.  Not good candidate for SGLT2 inhibitor given limited mobility and increased infection risk.   5.  Paroxysmal atypical atrial flutter with CHA2DS2-VASc score of 7, evident based on device interrogation.  Asymptomatic in terms of palpitations.  At this point she is not anticoagulated in light of iron  deficiency anemia with GI bleed source per workup, although improving hemoglobin with iron  infusions.  May be able to consider trial of Eliquis  2.5 mg twice daily in January if the situation continues to improve.  Disposition:  Follow up 3 months.  Signed, Jayson JUDITHANN Sierras, M.D., F.A.C.C. [facility]

## 2024-03-19 NOTE — Patient Instructions (Signed)
Medication Instructions:   Your physician recommends that you continue on your current medications as directed. Please refer to the Current Medication list given to you today.  Labwork: None today  Testing/Procedures: None today  Follow-Up: 3 months Dr.McDowell  Any Other Special Instructions Will Be Listed Below (If Applicable).  If you need a refill on your cardiac medications before your next appointment, please call your pharmacy.

## 2024-03-20 ENCOUNTER — Encounter (HOSPITAL_BASED_OUTPATIENT_CLINIC_OR_DEPARTMENT_OTHER): Payer: Self-pay | Admitting: Pulmonary Disease

## 2024-03-20 DIAGNOSIS — J9612 Chronic respiratory failure with hypercapnia: Secondary | ICD-10-CM

## 2024-03-20 DIAGNOSIS — J984 Other disorders of lung: Secondary | ICD-10-CM

## 2024-03-20 DIAGNOSIS — I2721 Secondary pulmonary arterial hypertension: Secondary | ICD-10-CM

## 2024-03-25 ENCOUNTER — Other Ambulatory Visit: Payer: Self-pay

## 2024-03-25 NOTE — Patient Instructions (Signed)
 Visit Information  Thank you for taking time to visit with me today. Please don't hesitate to contact me if I can be of assistance to you before our next scheduled appointment.  Your next care management appointment is by telephone on 04-28-2024 at 1:00 PM   Telephone follow-up in 1 month  Please call the care guide team at [phone] if you need to cancel, schedule, or reschedule an appointment.   Please call the Suicide and Crisis Lifeline: 988 call the [place]  National Suicide Prevention Lifeline: 414 454 3937 or TTY: (210)274-0126 TTY (608)730-5274) to talk to a trained counselor call 1-800-273-TALK (toll free, 24 hour hotline) go to [facility] [address] [place] [phone]) call 911 if you are experiencing a Mental Health or Behavioral Health Crisis or need someone to talk to.  Hendricks Her RN, BSN  [place] [facility] RN Case Information Systems Manager [phone]

## 2024-04-03 ENCOUNTER — Encounter (HOSPITAL_BASED_OUTPATIENT_CLINIC_OR_DEPARTMENT_OTHER): Payer: Self-pay | Admitting: Pulmonary Disease

## 2024-04-03 ENCOUNTER — Ambulatory Visit (INDEPENDENT_AMBULATORY_CARE_PROVIDER_SITE_OTHER): Admitting: Pulmonary Disease

## 2024-04-03 ENCOUNTER — Ambulatory Visit (HOSPITAL_BASED_OUTPATIENT_CLINIC_OR_DEPARTMENT_OTHER)

## 2024-04-03 VITALS — HR 113 | Temp 98.1°F | Ht 66.0 in | Wt 270.0 lb

## 2024-04-03 DIAGNOSIS — Z23 Encounter for immunization: Secondary | ICD-10-CM | POA: Diagnosis not present

## 2024-04-03 DIAGNOSIS — R0902 Hypoxemia: Secondary | ICD-10-CM | POA: Diagnosis not present

## 2024-04-03 DIAGNOSIS — J9611 Chronic respiratory failure with hypoxia: Secondary | ICD-10-CM

## 2024-04-03 DIAGNOSIS — R0989 Other specified symptoms and signs involving the circulatory and respiratory systems: Secondary | ICD-10-CM | POA: Diagnosis not present

## 2024-04-03 DIAGNOSIS — J9612 Chronic respiratory failure with hypercapnia: Secondary | ICD-10-CM | POA: Diagnosis not present

## 2024-04-03 DIAGNOSIS — R918 Other nonspecific abnormal finding of lung field: Secondary | ICD-10-CM | POA: Diagnosis not present

## 2024-04-03 DIAGNOSIS — G4733 Obstructive sleep apnea (adult) (pediatric): Secondary | ICD-10-CM

## 2024-04-03 DIAGNOSIS — I517 Cardiomegaly: Secondary | ICD-10-CM | POA: Diagnosis not present

## 2024-04-03 NOTE — Addendum Note (Signed)
 Addended by: TRUDY WARREN CROME on: 04/03/2024 03:21 PM   Modules accepted: Orders

## 2024-04-03 NOTE — Patient Instructions (Signed)
  VISIT SUMMARY: Today, we discussed the management of your chronic respiratory failure, hypoxia, obesity hypoventilation syndrome, and restrictive lung disease. We also administered your influenza vaccination.  YOUR PLAN: -CHRONIC RESPIRATORY FAILURE WITH HYPOXIA: Chronic respiratory failure with hypoxia means your body has difficulty getting enough oxygen . We will continue your oxygen  therapy at 4 L/min to keep your oxygen  saturation at 88%. It is important to use your BiPAP consistently to help manage carbon dioxide levels and prevent complications.  -OBESITY HYPOVENTILATION SYNDROME AND RESTRICTIVE LUNG DISEASE: Obesity hypoventilation syndrome and restrictive lung disease affect your breathing and lung function. Using BiPAP helps manage carbon dioxide levels in your body. We encourage you to use your BiPAP for at least 6 hours each night to prevent complications like fluid buildup and hospitalizations.  -INFLUENZA VACCINATION: You received your influenza vaccination today to help prevent respiratory infections. Staying up to date with vaccinations is important for your overall health.  INSTRUCTIONS: Please continue using your oxygen  therapy at 4 L/min and aim to use your BiPAP for at least 6 hours each night. If you experience increased sleepiness or confusion, contact your healthcare provider. Stay up to date with your vaccinations to prevent respiratory infections.                      Contains text generated by [employer]bridge.                                 Contains text generated by Abridge.

## 2024-04-03 NOTE — Progress Notes (Signed)
 Subjective:    Patient ID: Dawn Gonzales, female    DOB: 1941/01/15, 83 y.o.   MRN: [id number]   83 yo woman for FU of  chronic hypoxic and hypercarbic respiratory failure. - on auto bipap + 3 L of oxygen  -remote smoker , quit 1980, less than 10 PYrs -Restrictive lung disease/OSA/OHS   She was hospitalized 05/2022 for recurrent falls and hypoxia, required transient BiPAP, discharged to rehab on 4 L. ABG was 7.34/84/61/91% on 32% FiO2 Daughter obtained an AirSense auto CPAP on her own .  We then obtained a sleep study and placed her on auto bilevel with oxygen  blended in   PMH -TAVR,  complete heart block,  chronic Afib  diastolic CHF,  hypertension  Venous stasis disease   Discussed the use of AI scribe software for clinical note transcription with the patient, who gave verbal consent to proceed.  History of Present Illness Dawn Gonzales is an 83 year old female with chronic respiratory failure and hypoxia who presents for management of her oxygen  therapy and BiPAP use.  She has chronic respiratory failure with hypoxia, obesity hypoventilation syndrome, and restrictive lung disease on home oxygen  and BiPAP. At home, her oxygen  is set at 4 L/min through 60-foot tubing, with saturations in the mid-90s.  She has used BiPAP for 40 days but finds the mask uncomfortable with claustrophobia and often removes it after a few hours at night.  A month ago crackles were noted on lung exam by another provider, though not heard by cardiology two weeks ago. She has nonpainful leg swelling and takes torsemide  2 tablets twice daily with good urine output.  She uses a nebulizer and takes Mucinex  morning and evening. She has her usual morning cough with sputum, without fever or recent illness in the household.  Her caregiver notes she sometimes removes the BiPAP mask in her sleep, resulting in periods off oxygen . She aims to keep her oxygen  saturation above 88% at home.   Came in  84% on 2L, improved to 92 % on 4 L  BiPAP DL reviewed >> poor compliance, auto settings EPAP 12, PS +4  Significant tests/ events reviewed    CTA chest 06/2023 >> elevated RT hemi-D, RT basal atx   PAP titration 08/2022 >> 275 lbs  - She could not tolerate high pressures on CPAP & was switched to bilevel , optimal 20/16 05/2018 NPSG - AHI 12/h, low sat 83%, 164 mins with sat<88%   PFTs 03/2023 show moderate to severe restriction, FVC 47%, DLCO 66%, KCO normal  Review of Systems  neg for any significant sore throat, dysphagia, itching, sneezing, nasal congestion or excess/ purulent secretions, fever, chills, sweats, unintended wt loss, pleuritic or exertional cp, hempoptysis, orthopnea pnd or change in chronic leg swelling. Also denies presyncope, palpitations, heartburn, abdominal pain, nausea, vomiting, diarrhea or change in bowel or urinary habits, dysuria,hematuria, rash, arthralgias, visual complaints, headache, numbness weakness or ataxia.      Objective:   Physical Exam  Gen. Pleasant, obese, in no distress ENT - no lesions, no post nasal drip Neck: No JVD, no thyromegaly, no carotid bruits Lungs: no use of accessory muscles, no dullness to percussion, LT basal rales no rhonchi  Cardiovascular: Rhythm regular, heart sounds  normal, no murmurs or gallops, no peripheral edema Musculoskeletal: No deformities, no cyanosis or clubbing , no tremors       Assessment & Plan:   Assessment and Plan Assessment & Plan Chronic respiratory failure with hypoxia Chronic respiratory failure  with hypoxia, managed with oxygen  therapy. Oxygen  saturation should be maintained at 88% as her body is accustomed to this level. Current oxygen  setup at home is on 4 L/min, which is appropriate for maintaining saturation levels. BiPAP is used to manage carbon dioxide retention, crucial for preventing fluid buildup and hospitalizations. - Continue oxygen  therapy at 4 L/min to maintain saturation at 88%. -  Encouraged consistent use of BiPAP to manage carbon dioxide retention.  Obesity hypoventilation syndrome and restrictive lung disease Managed with BiPAP. She experiences claustrophobia with BiPAP use, leading to inconsistent usage. Emphasized the importance of BiPAP in managing carbon dioxide retention and preventing complications such as fluid buildup and hospitalizations. Explained that BiPAP helps by assisting in the expulsion of carbon dioxide, which oxygen  therapy alone cannot address. Highlighted that signs of elevated carbon dioxide include increased sleepiness and confusion. - Encouraged consistent use of BiPAP, aiming for at least 6 hours per night. - Educated on the importance of BiPAP in managing carbon dioxide retention.  Influenza vaccination She is due for an influenza vaccination. It is important to stay up to date with vaccinations to prevent respiratory infections. - Administered influenza vaccination today.

## 2024-04-04 DIAGNOSIS — G44009 Cluster headache syndrome, unspecified, not intractable: Secondary | ICD-10-CM | POA: Diagnosis not present

## 2024-04-07 ENCOUNTER — Ambulatory Visit: Payer: Self-pay | Admitting: Pulmonary Disease

## 2024-04-07 ENCOUNTER — Ambulatory Visit: Payer: Medicare PPO

## 2024-04-07 DIAGNOSIS — I5032 Chronic diastolic (congestive) heart failure: Secondary | ICD-10-CM | POA: Diagnosis not present

## 2024-04-08 LAB — CUP PACEART REMOTE DEVICE CHECK
Date Time Interrogation Session: 20251209075949
Implantable Lead Connection Status: 753985
Implantable Lead Connection Status: 753985
Implantable Lead Implant Date: 20200311
Implantable Lead Implant Date: 20200311
Implantable Lead Location: 753859
Implantable Lead Location: 753860
Implantable Lead Model: 377
Implantable Lead Model: 377
Implantable Lead Serial Number: 81024123
Implantable Lead Serial Number: 81075341
Implantable Pulse Generator Implant Date: 20200311
Pulse Gen Model: 407145
Pulse Gen Serial Number: 69550148

## 2024-04-09 ENCOUNTER — Ambulatory Visit: Payer: Self-pay | Admitting: Internal Medicine

## 2024-04-09 NOTE — Progress Notes (Signed)
 Remote PPM Transmission

## 2024-04-14 ENCOUNTER — Other Ambulatory Visit: Payer: Self-pay | Admitting: *Deleted

## 2024-04-14 DIAGNOSIS — D649 Anemia, unspecified: Secondary | ICD-10-CM

## 2024-04-14 DIAGNOSIS — E611 Iron deficiency: Secondary | ICD-10-CM

## 2024-04-28 ENCOUNTER — Encounter: Payer: Self-pay | Admitting: *Deleted

## 2024-04-28 ENCOUNTER — Other Ambulatory Visit: Payer: Self-pay

## 2024-04-28 NOTE — Patient Outreach (Signed)
 Complex Care Management   Visit Note  04/28/2024  Name:  Dawn Gonzales MRN: [id number] DOB: 12-21-1940  Situation: Referral received for Complex Care Management related to anemia I obtained verbal consent from Patient.  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   26 mm Edwards Sapien 3 transcatheter heart valve placed via percutaneous right transfemoral approach    Severe aortic stenosis    Sleep apnea     Assessment: Patient Reported Symptoms:  Cognitive Cognitive Status: No symptoms reported Cognitive/Intellectual Conditions Management [RPT]: None reported or documented in medical history or problem list   Health Maintenance Behaviors: Annual physical exam, None Healing Pattern: Average Health Facilitated by: Pain control  Neurological Neurological Review of Symptoms: No symptoms reported Neurological Management Strategies: Adequate rest, Routine screening, Coping strategies  HEENT HEENT Symptoms Reported: No symptoms reported HEENT Management Strategies: Coping strategies, Adequate rest HEENT Self-Management Outcome: 4 (good)    Cardiovascular Cardiovascular Symptoms Reported: No symptoms reported Other Cardiovascular Symptoms: fatigue greatly improved Does patient have uncontrolled Hypertension?: No Cardiovascular Management Strategies: Adequate rest, Coping strategies, Routine screening Do You Have a Working Readable Scale?: Yes Weight:  (too difficult to  get on scale at home) Cardiovascular Self-Management Outcome: 4 (good)  Respiratory Respiratory Symptoms Reported: No symptoms reported Other Respiratory Symptoms: OSA   Bi PAP Additional Respiratory Details: 02 3-4 L Continuous [employer] Respiratory Management Strategies: Activity, Adequate rest, Breathing exercise, Coping strategies, Medication therapy Respiratory Self-Management Outcome: 4 (good)  Endocrine Endocrine Symptoms Reported: No symptoms reported Is patient diabetic?: No Endocrine Self-Management Outcome: 4 (good)  Gastrointestinal Gastrointestinal Symptoms Reported: No symptoms reported Gastrointestinal Management Strategies: Coping strategies Gastrointestinal Self-Management Outcome: 4 (good)    Genitourinary Genitourinary Symptoms Reported: Other Other Genitourinary Symptoms: had UTI week before 12/17- Had test and treated with ABX Genitourinary Management Strategies: Adequate rest, Coping strategies Genitourinary Self-Management Outcome: 4 (good)  Integumentary Integumentary Symptoms Reported: Other Other Integumentary Symptoms: a bit of breakdown on buttock-  Just tender, not broken open  Butt Paste /Silvadene Skin Management Strategies: Adequate rest, Medication therapy Skin Self-Management Outcome: 4 (good)  Musculoskeletal Musculoskelatal Symptoms Reviewed: Limited mobility Musculoskeletal Management Strategies: Medication therapy, Medical device, Coping strategies, Routine screening Musculoskeletal Self-Management Outcome: 4 (good) Falls in the past year?: Yes Number of falls in past year: 1 or less Was there an injury with Fall?: No Fall Risk Category Calculator: 1 Patient Fall Risk Level: Low Fall Risk Patient at Risk for Falls Due to: History of fall(s) Fall risk Follow up: Falls evaluation completed  Psychosocial Psychosocial Symptoms Reported: No symptoms reported Behavioral Management Strategies: Optimal nutrition intake Behavioral Health  Self-Management Outcome: 4 (good) Major Change/Loss/Stressor/Fears (CP): Medical condition, family, Medical condition, self Techniques to Cope with Loss/Stress/Change: Diversional activities Do you feel physically threatened by others?: No    04/28/2024    PHQ2-9 Depression Screening   Little interest  or pleasure in doing things Not at all  Feeling down, depressed, or hopeless Not at all  PHQ-2 - Total Score 0  Trouble falling or staying asleep, or sleeping too much    Feeling tired or having little energy    Poor appetite or overeating     Feeling bad about yourself - or that you are a failure or have let yourself or your family down    Trouble concentrating on things, such as reading the newspaper or watching television    Moving or speaking so slowly that other people could have noticed.  Or the opposite - being so fidgety or restless that you have been moving around a lot more than usual    Thoughts that you would be better off dead, or hurting yourself in some way    PHQ2-9 Total Score    If you checked off any problems, how difficult have these problems made it for you to do your work, take care of things at home, or get along with other people    Depression Interventions/Treatment      Today's Vitals   04/28/24 1321  Weight: 270 lb (122.5 kg)   Pain Scale: 0-10 Pain Score: 0-No pain  Medications Reviewed Today     Reviewed by Kay Hendricks MATSU, RN (Case Manager) on 04/28/24 at 1316  Med List Status: <None>   Medication Order Taking? Sig Documenting Provider Last Dose Status Informant  acetaminophen  (TYLENOL ) 325 MG tablet [id number] Yes Take 2 tablets (650 mg total) by mouth every 6 (six) hours as needed for mild pain, fever or headache (or Fever >/= 101). Pearlean Manus, MD  Active Child  albuterol  (VENTOLIN  HFA) [phone] Base) MCG/ACT inhaler [id number] Yes Inhale 2 puffs every 4 hours by inhalation route. [provider]  Active   apixaban  (ELIQUIS ) 5 MG TABS  tablet [id number]  Take 1 tablet (5 mg total) by mouth 2 (two) times daily.  Patient not taking: Reported on 04/28/2024   Debera Jayson MATSU, MD  Active            Med Note ANDY, [facility]   Thu Dec 27, 2023  6:37 AM) Not taking  Calcium  Carbonate (CALCIUM -CARB 600 PO) [id number] Yes Take 600 mg by mouth daily. [provider]  Active Child  cholecalciferol  (VITAMIN D ) 1000 units tablet [id number] Yes Take 1,000 Units by mouth daily. [provider]  Active Child  clotrimazole-betamethasone  (LOTRISONE) cream [id number] Yes Apply 1 Application topically. [provider]  Active   Cyanocobalamin  (B-12 PO) [id number] Yes Take by mouth. [provider]  Active   dextromethorphan -guaiFENesin  (MUCINEX  DM) 30-600 MG 12hr tablet [id number] Yes Take 1 tablet by mouth 2 (two) times daily. Pearlean Manus, MD  Active Child  ferrous sulfate  325 (65 FE) MG tablet [id number] Yes Take 325 mg by mouth daily with breakfast. [provider]  Active Child           Med Note ELSTON, ELIZABETH   Tue Dec 25, 2023 11:19 AM) Stopped a week ago  gabapentin  (NEURONTIN ) 300 MG capsule [id number] Yes Take 300 mg by mouth 2 (two) times daily. [provider]  Active Child           Med Note CARMIN ETHA HERO   Wed Apr 05, 2022 12:16 PM)    ipratropium-albuterol  (DUONEB) 0.5-2.5 (3) MG/3ML SOLN [id number] Yes Take 3 mLs by nebulization 2 (two) times daily. Willette Adriana LABOR, MD  Active Child  metoprolol  tartrate 37.5 MG  TABS [id number] Yes Take 1 tablet (37.5 mg total) by mouth 2 (two) times daily. Evonnie Lenis, MD  Active   Multiple Vitamin (MULTIVITAMIN WITH MINERALS) TABS tablet [id number] Yes Take 1 tablet by mouth daily. [provider]  Active Child  OXYGEN  [id number] Yes Inhale 3 L into the lungs. With long tube mostly 1-1.5 [provider]  Active Child  pantoprazole  (PROTONIX ) 40 MG tablet [id number] Yes TAKE 1 TABLET BY MOUTH EVERY DAY Ahmed, Muhammad F,  MD  Active   polyethylene glycol powder (GLYCOLAX /MIRALAX ) 17 GM/SCOOP powder [id number] Yes Take 8.5 g by mouth daily.  Patient taking differently: Take 8.5 g by mouth as needed for mild constipation.   Cinderella Deatrice FALCON, MD  Active   potassium chloride  (KLOR-CON ) 10 MEQ tablet [id number] Yes Take 1 tablet (10 mEq total) by mouth daily. Take While taking Demadex Orinda Pearlean Manus, MD  Active Child  pravastatin  (PRAVACHOL ) 20 MG tablet [id number] Yes Take 20 mg by mouth daily. [provider]  Active Child  silver sulfADIAZINE (SILVADENE) 1 % cream [id number] Yes Apply 1 Application topically daily as needed (wound). [provider]  Active Child  torsemide  (DEMADEX ) 20 MG tablet [id number] Yes Take 2 tablets (40 mg total) by mouth 2 (two) times daily. Debera Jayson MATSU, MD  Active   traMADol  (ULTRAM ) 50 MG tablet [id number] Yes Take 50 mg by mouth every 6 (six) hours as needed. for pain [provider]  Active   Turmeric (QC TUMERIC COMPLEX) 500 MG CAPS [id number] Yes Take 1 capsule by mouth at bedtime. [provider]  Active Child  venlafaxine  (EFFEXOR ) 37.5 MG tablet [id number] Yes Take 37.5 mg by mouth daily. [provider]  Active Child           Med Note CARMIN ETHA HERO   Wed Apr 05, 2022 12:16 PM)    Vibegron  (GEMTESA ) 75 MG TABS [id number] Yes Take 1 tablet (75 mg total) by mouth daily. McKenzie, Belvie CROME, MD  Active Child            Recommendation:   Continue Current Plan of Care  Follow Up Plan:   Telephone follow-up in 1 month  Hendricks Her RN, BSN  [place] [facility] RN Case Manager   Direct [phone]

## 2024-04-28 NOTE — Patient Instructions (Signed)
 Visit Information  Thank you for taking time to visit with me today. Please don't hesitate to contact me if I can be of assistance to you before our next scheduled appointment.  Your next care management appointment is by telephone on 05/26/2024 at 2:00 PM   Telephone follow-up in 1 month  Please call the care guide team at [phone] if you need to cancel, schedule, or reschedule an appointment.   Please call the Suicide and Crisis Lifeline: 988 call the [place]  National Suicide Prevention Lifeline: (949)557-8121 or TTY: 617-334-0163 TTY 650-423-4068) to talk to a trained counselor call 1-800-273-TALK (toll free, 24 hour hotline) go to [facility] [address] [place] [phone]) call 911 if you are experiencing a Mental Health or Behavioral Health Crisis or need someone to talk to. Hendricks Her RN, BSN  [place] [facility] RN Case Information Systems Manager [phone]

## 2024-04-29 NOTE — Addendum Note (Signed)
 Addended by: Masyn Rostro A on: 04/29/2024 02:24 PM   Modules accepted: Orders

## 2024-05-02 ENCOUNTER — Inpatient Hospital Stay: Attending: Oncology

## 2024-05-07 ENCOUNTER — Inpatient Hospital Stay

## 2024-05-14 ENCOUNTER — Inpatient Hospital Stay

## 2024-05-14 ENCOUNTER — Inpatient Hospital Stay: Attending: Oncology | Admitting: Oncology

## 2024-05-14 VITALS — BP 121/39 | HR 59 | Temp 98.0°F | Resp 18

## 2024-05-14 DIAGNOSIS — Z8679 Personal history of other diseases of the circulatory system: Secondary | ICD-10-CM | POA: Diagnosis not present

## 2024-05-14 DIAGNOSIS — G629 Polyneuropathy, unspecified: Secondary | ICD-10-CM | POA: Insufficient documentation

## 2024-05-14 DIAGNOSIS — K635 Polyp of colon: Secondary | ICD-10-CM | POA: Insufficient documentation

## 2024-05-14 DIAGNOSIS — D509 Iron deficiency anemia, unspecified: Secondary | ICD-10-CM | POA: Diagnosis present

## 2024-05-14 DIAGNOSIS — M2550 Pain in unspecified joint: Secondary | ICD-10-CM | POA: Diagnosis not present

## 2024-05-14 DIAGNOSIS — N1831 Chronic kidney disease, stage 3a: Secondary | ICD-10-CM | POA: Diagnosis not present

## 2024-05-14 DIAGNOSIS — R0602 Shortness of breath: Secondary | ICD-10-CM | POA: Diagnosis not present

## 2024-05-14 DIAGNOSIS — D7589 Other specified diseases of blood and blood-forming organs: Secondary | ICD-10-CM | POA: Diagnosis not present

## 2024-05-14 DIAGNOSIS — Z7901 Long term (current) use of anticoagulants: Secondary | ICD-10-CM | POA: Diagnosis not present

## 2024-05-14 DIAGNOSIS — I4891 Unspecified atrial fibrillation: Secondary | ICD-10-CM | POA: Diagnosis not present

## 2024-05-14 DIAGNOSIS — D649 Anemia, unspecified: Secondary | ICD-10-CM

## 2024-05-14 DIAGNOSIS — I700 Atherosclerosis of aorta: Secondary | ICD-10-CM | POA: Diagnosis not present

## 2024-05-14 DIAGNOSIS — Z79899 Other long term (current) drug therapy: Secondary | ICD-10-CM | POA: Insufficient documentation

## 2024-05-14 DIAGNOSIS — G4730 Sleep apnea, unspecified: Secondary | ICD-10-CM | POA: Insufficient documentation

## 2024-05-14 DIAGNOSIS — K649 Unspecified hemorrhoids: Secondary | ICD-10-CM | POA: Insufficient documentation

## 2024-05-14 DIAGNOSIS — E785 Hyperlipidemia, unspecified: Secondary | ICD-10-CM | POA: Diagnosis not present

## 2024-05-14 DIAGNOSIS — K2970 Gastritis, unspecified, without bleeding: Secondary | ICD-10-CM | POA: Diagnosis not present

## 2024-05-14 DIAGNOSIS — E559 Vitamin D deficiency, unspecified: Secondary | ICD-10-CM | POA: Insufficient documentation

## 2024-05-14 DIAGNOSIS — E611 Iron deficiency: Secondary | ICD-10-CM

## 2024-05-14 DIAGNOSIS — I130 Hypertensive heart and chronic kidney disease with heart failure and stage 1 through stage 4 chronic kidney disease, or unspecified chronic kidney disease: Secondary | ICD-10-CM | POA: Insufficient documentation

## 2024-05-14 LAB — VITAMIN B12: Vitamin B-12: 587 pg/mL (ref 180–914)

## 2024-05-14 NOTE — Progress Notes (Signed)
 "   [facility] OFFICE PROGRESS NOTE  Dawn Norleen PEDLAR, MD  ASSESSMENT & PLAN:  Assessment & Plan Anemia, unspecified type - Anemia most likely from blood loss from being on Eliquis .  Her hemoglobin was normal in March prior to starting Eliquis .  Macrocytosis likely from reticulocytosis. -She received 1 g of INFeD  on 10/19/2023 and again on 02/22/2024. -Additional lab work from workup with Dr. Rogers showed iron  deficiency with iron  saturations of 7% and a ferritin of 37.  TIBC was elevated at 429.  SPEP was negative for M spike.  Immunofixation was unremarkable.  No evidence of hemolysis.  MMA was elevated at 419 and copper  levels were WNL. - EGD and colonoscopy were essentially unremarkable.  EGD showed gastritis in the stomach and colonoscopy revealed hemorrhoids and two 4 to 6 mm polyps 1.  Pathology was unremarkable. -She is able to tolerate oral iron  325 mg daily.  She is taking it in the middle of the day to avoid interactions with other medications.  She is taking this with orange juice and turmeric. - Labs from 04/16/2024 ([facility] ) show hemoglobin of 11.2 with normal platelets and white blood cells.  Iron  panel showed iron  saturations of 20%. -Given decline in hemoglobin, would recommend repeating labs to see if she has further decline. -Return to clinic in 3 months with labs only in 6 months with labs and see provider   Orders Placed This Encounter  Procedures   Vitamin B12    Standing Status:   Future    Expected Date:   07/29/2024    Expiration Date:   11/12/2024   Methylmalonic acid, serum    Standing Status:   Future    Expected Date:   07/29/2024    Expiration Date:   11/12/2024   Ferritin    Standing Status:   Future    Expected Date:   07/29/2024    Expiration Date:   11/12/2024   Iron  and TIBC ([facility] ONLY)    Standing Status:   Future    Expected Date:   07/29/2024    Expiration Date:   11/12/2024   CBC    Standing Status:   Future     Expected Date:   07/29/2024    Expiration Date:   11/12/2024    INTERVAL HISTORY: Today, she states that she is doing well overall. Her appetite level is at 100%. Her energy level is at 75%.  She is here today with her daughter.  Reports improvement of her energy following IV iron .  She denies any bleeding, hematochezia or melena.  Patient reports she had a colonoscopy and upper endoscopy on 12/27/2023 without any significant findings.  She continues iron  in the middle of the day with vitamin C  and turmeric.  She is taking B12 daily as well.  She denies any interval hospitalizations, surgeries or changes to baseline health.  SUMMARY OF HEMATOLOGIC HISTORY:  Dawn Gonzales is a 84 y.o. female presenting to clinic today for evaluation of anemia at the request of Hyacinth Honey, NP.  Patient has a medical history of CKD stage 3a, hypertension, CHF, A-fib, atherosclerosis of aorta, aneurysm of thoracic aorta, restrictive lung disease, sleep apnea, hyperlipidemia, vitamin D  deficiency, polyneuropathy, and idiopathic peripheral neuropathy.   Dawn Gonzales was seen by [facility] NP on 10/09/23 for same day labs. His most recent CBC diff from 10/09/23 showed low RBC at 2.53, low HGB at 7.7, low HCT at 26.1, elevated MCV at 103, and elevated MCHC at  29.5. Folate from 07/21/23 was normal being over 40.0. Vitamin B12 from 07/21/23 was normal at 420.   Last colonoscopy was back in [place] prior to moving down in 2019.  She was started on Eliquis  on 08/01/2023 and hemoglobin dropped on 10/12/2023 to as low as 7.7.  She denied any bright red blood per rectum/melena.  No ice pica.  She has been on iron  tablets for years.  Review of Systems  Constitutional:  Positive for malaise/fatigue.  Respiratory:  Positive for shortness of breath.   Musculoskeletal:  Positive for joint pain.   Physical Exam Constitutional:      Appearance: Normal appearance. She is obese.  Cardiovascular:     Rate and Rhythm: Normal rate and  regular rhythm.  Pulmonary:     Effort: Pulmonary effort is normal.     Breath sounds: Normal breath sounds.  Abdominal:     General: Bowel sounds are normal.     Palpations: Abdomen is soft.  Musculoskeletal:        General: No swelling. Normal range of motion.  Neurological:     Mental Status: She is alert and oriented to person, place, and time. Mental status is at baseline.      No results found for: CBC  Vitals:   05/14/24 1124  BP: (!) 121/39  Pulse: (!) 59  Resp: 18  Temp: 98 F (36.7 C)  SpO2: 94%     I spent 25 minutes dedicated to the care of this patient (face-to-face and non-face-to-face) on the date of the encounter to include what is described in the assessment and plan.,  Delon Hope, NP 05/14/2024 3:37 PM   "

## 2024-05-14 NOTE — Assessment & Plan Note (Addendum)
-   Anemia most likely from blood loss from being on Eliquis .  Her hemoglobin was normal in March prior to starting Eliquis .  Macrocytosis likely from reticulocytosis. -She received 1 g of INFeD  on 10/19/2023 and again on 02/22/2024. -Additional lab work from workup with Dr. Rogers showed iron  deficiency with iron  saturations of 7% and a ferritin of 37.  TIBC was elevated at 429.  SPEP was negative for M spike.  Immunofixation was unremarkable.  No evidence of hemolysis.  MMA was elevated at 419 and copper  levels were WNL. - EGD and colonoscopy were essentially unremarkable.  EGD showed gastritis in the stomach and colonoscopy revealed hemorrhoids and two 4 to 6 mm polyps 1.  Pathology was unremarkable. -She is able to tolerate oral iron  325 mg daily.  She is taking it in the middle of the day to avoid interactions with other medications.  She is taking this with orange juice and turmeric. - Labs from 04/16/2024 ([facility] ) show hemoglobin of 11.2 with normal platelets and white blood cells.  Iron  panel showed iron  saturations of 20%. -Given decline in hemoglobin, would recommend repeating labs to see if she has further decline. -Return to clinic in 3 months with labs only in 6 months with labs and see provider

## 2024-05-16 LAB — METHYLMALONIC ACID, SERUM: Methylmalonic Acid, Quantitative: 336 nmol/L (ref 0–378)

## 2024-05-26 ENCOUNTER — Other Ambulatory Visit: Payer: Self-pay

## 2024-05-26 NOTE — Patient Instructions (Signed)
 Visit Information  Thank you for taking time to visit with me today. Please don't hesitate to contact me if I can be of assistance to you before our next scheduled appointment.  Your next care management appointment is by telephone on 06-26-2024 at 11:00 AM   Telephone follow-up in 1 month  Please call the care guide team at [phone] if you need to cancel, schedule, or reschedule an appointment.   Please call the Suicide and Crisis Lifeline: 988 call the [place]  National Suicide Prevention Lifeline: (804)105-1529 or TTY: (479) 664-6029 TTY [phone]) to talk to a trained counselor call 1-800-273-TALK (toll free, 24 hour hotline) go to [facility] [address] [place] [phone]) call 911 if you are experiencing a Mental Health or Behavioral Health Crisis or need someone to talk to.  Hendricks Her RN, BSN  [place] [facility]Population [facility] RN Case Information Systems Manager [phone]

## 2024-05-26 NOTE — Patient Outreach (Signed)
 Complex Care Management   Visit Note  05/26/2024  Name:  Dawn Gonzales MRN: [id number] DOB: May 05, 1940  Situation: Referral received for Complex Care Management related to Anemia I obtained verbal consent from Patient.  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   26 mm Edwards Sapien 3 transcatheter heart valve placed via percutaneous right transfemoral approach    Severe aortic stenosis    Sleep apnea     Assessment: Patient Reported Symptoms:  Cognitive Cognitive Status: No symptoms reported, Alert and oriented to person, place, and time, Normal speech and language skills Cognitive/Intellectual Conditions Management [RPT]: None reported or documented in medical history or problem list   Health Maintenance Behaviors: Annual physical exam Healing Pattern: Average Health Facilitated by: Rest, Pain control  Neurological Neurological Review of Symptoms: No symptoms reported Neurological Management Strategies: Adequate rest, Coping strategies Neurological Self-Management Outcome: 4 (good)  HEENT HEENT Symptoms Reported: No symptoms reported HEENT Management Strategies: Adequate rest, Coping strategies HEENT Self-Management Outcome: 4 (good)    Cardiovascular Cardiovascular Symptoms Reported: No symptoms reported Does patient have uncontrolled Hypertension?: No Cardiovascular Management Strategies: Adequate rest, Coping strategies, Routine screening Do You Have a  Working Readable Scale?: Yes Cardiovascular Self-Management Outcome: 4 (good)  Respiratory Respiratory Symptoms Reported: No symptoms reported Other Respiratory Symptoms: OSA  Bi PAP Additional Respiratory Details: 3L continuous Respiratory Management Strategies: Adequate rest, Activity, Routine screening Respiratory Self-Management Outcome: 4 (good)  Endocrine Endocrine Symptoms Reported: No symptoms reported Is patient diabetic?: No Endocrine Self-Management Outcome: 4 (good)  Gastrointestinal Gastrointestinal Symptoms Reported: No symptoms reported Gastrointestinal Management Strategies: Coping strategies Gastrointestinal Self-Management Outcome: 4 (good)    Genitourinary Genitourinary Symptoms Reported: No symptoms reported Genitourinary Management Strategies: Adequate rest, Coping strategies Genitourinary Self-Management Outcome: 4 (good)  Integumentary Integumentary Symptoms Reported: No symptoms reported Skin Management Strategies: Adequate rest, Medication therapy Skin Self-Management Outcome: 4 (good)  Musculoskeletal Musculoskelatal Symptoms Reviewed: Not assessed Musculoskeletal Management Strategies: Coping strategies, Adequate rest Musculoskeletal Self-Management Outcome: 4 (good) Falls in the past year?: Yes Number of falls in past year: 1 or less Was there an injury with Fall?: No Fall Risk Category Calculator: 1 Patient Fall Risk Level: Low Fall Risk Patient at Risk for Falls Due to: History of fall(s) Fall risk Follow up: Falls evaluation completed, Education provided, Falls prevention discussed  Psychosocial Psychosocial Symptoms Reported: No symptoms reported Behavioral Management Strategies: Optimal nutrition intake Behavioral Health Self-Management Outcome: 4 (good) Major Change/Loss/Stressor/Fears (CP): Medical condition, self Techniques to Cope with Loss/Stress/Change: Diversional activities Quality of Family Relationships: helpful, involved, supportive Do  you feel physically threatened by others?: No    05/26/2024    PHQ2-9 Depression Screening   Little interest or pleasure in doing things Not at all  Feeling down, depressed, or hopeless Not at all  PHQ-2 - Total Score 0  Trouble falling or staying asleep, or sleeping too much  Feeling tired or having little energy    Poor appetite or overeating     Feeling bad about yourself - or that you are a failure or have let yourself or your family down    Trouble concentrating on things, such as reading the newspaper or watching television    Moving or speaking so slowly that other people could have noticed.  Or the opposite - being so fidgety or restless that you have been moving around a lot more than usual    Thoughts that you would be better off dead, or hurting yourself in some way    PHQ2-9 Total Score    If you checked off any problems, how difficult have these problems made it for you to do your work, take care of things at home, or get along with other people    Depression Interventions/Treatment      There were no vitals filed for this visit. Pain Scale: 0-10 Pain Score: 2  Pain Location: Knee Pain Orientation: Left, Right Pain Descriptors / Indicators: Aching Pain Onset: Other (Comment) (Comes and goes) Patients Stated Pain Goal: 0 Pain Intervention(s): Medication (See eMAR) Multiple Pain Sites: Yes  Medications Reviewed Today     Reviewed by Kay Hendricks MATSU, RN (Case Manager) on 05/26/24 at 1412  Med List Status: <None>   Medication Order Taking? Sig Documenting Provider Last Dose Status Informant  acetaminophen  (TYLENOL ) 325 MG tablet [id number] Yes Take 2 tablets (650 mg total) by mouth every 6 (six) hours as needed for mild pain, fever or headache (or Fever >/= 101). Pearlean Manus, MD  Active Child  albuterol  (VENTOLIN  HFA) 108 (90 Base) MCG/ACT inhaler [id number] Yes Inhale 2 puffs every 4 hours by inhalation route. [provider]  Active   apixaban  (ELIQUIS ) 5  MG TABS tablet [id number]  Take 1 tablet (5 mg total) by mouth 2 (two) times daily.  Patient not taking: Reported on 05/26/2024   Debera Jayson MATSU, MD  Consider Medication Status and Discontinue            Med Note ANDY TYE DEL   Thu Dec 27, 2023  6:37 AM) Not taking  Calcium  Carbonate (CALCIUM -CARB 600 PO) [id number] Yes Take 600 mg by mouth daily. [provider]  Active Child  cholecalciferol  (VITAMIN D ) 1000 units tablet [id number] Yes Take 1,000 Units by mouth daily. [provider]  Active Child  clotrimazole-betamethasone  (LOTRISONE) cream [id number] Yes Apply 1 Application topically. [provider]  Active   Cyanocobalamin  (B-12 PO) [id number] Yes Take by mouth. [provider]  Active   dextromethorphan -guaiFENesin  (MUCINEX  DM) 30-600 MG 12hr tablet [id number] Yes Take 1 tablet by mouth 2 (two) times daily. Pearlean Manus, MD  Active Child  ferrous sulfate  325 (65 FE) MG tablet [id number] Yes Take 325 mg by mouth daily with breakfast. [provider]  Active Child           Med Note (OVERMAN, HANNAH E   Wed May 14, 2024 11:32 AM)    gabapentin  (NEURONTIN ) 300 MG capsule [id number] Yes Take 300 mg by mouth 2 (two) times daily. [provider]  Active Child           Med Note CARMIN ETHA HERO   Wed Apr 05, 2022 12:16 PM)    ipratropium-albuterol  (DUONEB) 0.5-2.5 (3) MG/3ML SOLN [id number] Yes Take 3 mLs by nebulization 2 (two) times daily. Willette Adriana LABOR, MD  Active Child  metoprolol  tartrate 37.5 MG TABS [id number] Yes Take 1 tablet (  37.5 mg total) by mouth 2 (two) times daily. Evonnie Lenis, MD  Active   Multiple Vitamin (MULTIVITAMIN WITH MINERALS) TABS tablet [id number] Yes Take 1 tablet by mouth daily. [provider]  Active Child  nystatin  cream (MYCOSTATIN ) [id number] Yes Apply topically 2 (two) times daily. [provider]  Active   OXYGEN  [id number] Yes Inhale 3 L into the lungs. With long tube mostly 1-1.5  [provider]  Active Child  pantoprazole  (PROTONIX ) 40 MG tablet [id number] Yes TAKE 1 TABLET BY MOUTH EVERY DAY Ahmed, Muhammad F, MD  Active   polyethylene glycol powder (GLYCOLAX /MIRALAX ) 17 GM/SCOOP powder [id number] Yes Take 8.5 g by mouth daily.  Patient taking differently: Take 8.5 g by mouth as needed for mild constipation.   Ahmed, Muhammad F, MD  Active   potassium chloride  (KLOR-CON ) 10 MEQ tablet [id number] Yes Take 1 tablet (10 mEq total) by mouth daily. Take While taking Demadex /Torsemide  Pearlean Manus, MD  Active Child  pravastatin  (PRAVACHOL ) 20 MG tablet [id number] Yes Take 20 mg by mouth daily. [provider]  Active Child  silver sulfADIAZINE (SILVADENE) 1 % cream [id number] Yes Apply 1 Application topically daily as needed (wound). [provider]  Active Child  torsemide  (DEMADEX ) 20 MG tablet [id number] Yes Take 2 tablets (40 mg total) by mouth 2 (two) times daily. Debera Jayson MATSU, MD  Active   traMADol  (ULTRAM ) 50 MG tablet [id number] Yes Take 50 mg by mouth every 6 (six) hours as needed. for pain [provider]  Active   Turmeric (QC TUMERIC COMPLEX) 500 MG CAPS [id number] Yes Take 1 capsule by mouth at bedtime. [provider]  Active Child  venlafaxine  (EFFEXOR ) 37.5 MG tablet [id number] Yes Take 37.5 mg by mouth daily. [provider]  Active Child           Med Note CARMIN ETHA HERO   Wed Apr 05, 2022 12:16 PM)    Vibegron  (GEMTESA ) 75 MG TABS [id number] Yes Take 1 tablet (75 mg total) by mouth daily. McKenzie, Belvie CROME, MD  Active Child            Recommendation:   Specialty provider follow-up Please contact [facility] for lab results if you haven't received them  within 48 hours  Continue Current Plan of Care  Follow Up Plan:   Telephone follow-up in 1 month  Hendricks Her RN, BSN  [place] [facility] RN Case Manager   Direct [phone]

## 2024-06-23 ENCOUNTER — Ambulatory Visit: Admitting: Cardiology

## 2024-06-26 ENCOUNTER — Telehealth

## 2024-08-11 ENCOUNTER — Ambulatory Visit (INDEPENDENT_AMBULATORY_CARE_PROVIDER_SITE_OTHER): Admitting: Gastroenterology

## 2024-08-11 ENCOUNTER — Inpatient Hospital Stay

## 2024-08-12 ENCOUNTER — Inpatient Hospital Stay

## 2024-11-11 ENCOUNTER — Inpatient Hospital Stay

## 2024-11-11 ENCOUNTER — Inpatient Hospital Stay: Admitting: Oncology
